# Patient Record
Sex: Male | Born: 1935
Health system: Southern US, Community
[De-identification: ages and names within clinical notes are randomized; demographics above are authoritative.]

## PROBLEM LIST (undated history)

## (undated) DIAGNOSIS — E785 Hyperlipidemia, unspecified: Secondary | ICD-10-CM

## (undated) DIAGNOSIS — Z7902 Long term (current) use of antithrombotics/antiplatelets: Secondary | ICD-10-CM

## (undated) DIAGNOSIS — E669 Obesity, unspecified: Secondary | ICD-10-CM

## (undated) DIAGNOSIS — M7989 Other specified soft tissue disorders: Secondary | ICD-10-CM

## (undated) DIAGNOSIS — I2510 Atherosclerotic heart disease of native coronary artery without angina pectoris: Secondary | ICD-10-CM

## (undated) DIAGNOSIS — C609 Malignant neoplasm of penis, unspecified: Secondary | ICD-10-CM

## (undated) HISTORY — DX: Obesity, unspecified: E66.9

## (undated) HISTORY — PX: CORONARY ARTERY BYPASS GRAFT: SHX141

## (undated) HISTORY — PX: BACK SURGERY: SHX140

## (undated) HISTORY — DX: Hyperlipidemia, unspecified: E78.5

## (undated) HISTORY — PX: CHOLECYSTECTOMY: SHX55

## (undated) HISTORY — DX: Atherosclerotic heart disease of native coronary artery without angina pectoris: I25.10

## (undated) HISTORY — DX: Long term (current) use of antithrombotics/antiplatelets: Z79.02

## (undated) HISTORY — PX: HIATAL HERNIA REPAIR: SHX195

## (undated) HISTORY — PX: OTHER SURGICAL HISTORY: SHX169

## (undated) HISTORY — PX: APPENDECTOMY: SHX54

## (undated) HISTORY — DX: Other specified soft tissue disorders: M79.89

---

## 2002-05-05 ENCOUNTER — Encounter: Payer: Self-pay | Admitting: Neurological Surgery

## 2002-05-09 ENCOUNTER — Inpatient Hospital Stay (HOSPITAL_COMMUNITY): Admission: RE | Admit: 2002-05-09 | Discharge: 2002-05-10 | Payer: Self-pay | Admitting: Neurological Surgery

## 2002-05-09 ENCOUNTER — Encounter: Payer: Self-pay | Admitting: Neurological Surgery

## 2005-05-05 ENCOUNTER — Emergency Department (HOSPITAL_COMMUNITY): Admission: EM | Admit: 2005-05-05 | Discharge: 2005-05-05 | Payer: Self-pay | Admitting: Emergency Medicine

## 2005-05-13 ENCOUNTER — Ambulatory Visit (HOSPITAL_COMMUNITY): Admission: RE | Admit: 2005-05-13 | Discharge: 2005-05-13 | Payer: Self-pay | Admitting: Internal Medicine

## 2005-11-27 HISTORY — PX: CORONARY ANGIOPLASTY: SHX604

## 2005-12-19 ENCOUNTER — Inpatient Hospital Stay (HOSPITAL_COMMUNITY): Admission: AD | Admit: 2005-12-19 | Discharge: 2005-12-23 | Payer: Self-pay | Admitting: Cardiovascular Disease

## 2005-12-22 HISTORY — PX: TRANSTHORACIC ECHOCARDIOGRAM: SHX275

## 2008-11-05 ENCOUNTER — Encounter (INDEPENDENT_AMBULATORY_CARE_PROVIDER_SITE_OTHER): Payer: Self-pay | Admitting: Urology

## 2008-11-05 ENCOUNTER — Inpatient Hospital Stay (HOSPITAL_COMMUNITY): Admission: RE | Admit: 2008-11-05 | Discharge: 2008-11-06 | Payer: Self-pay | Admitting: Urology

## 2009-04-06 ENCOUNTER — Emergency Department (HOSPITAL_COMMUNITY): Admission: EM | Admit: 2009-04-06 | Discharge: 2009-04-06 | Payer: Self-pay | Admitting: Emergency Medicine

## 2009-05-09 ENCOUNTER — Ambulatory Visit (HOSPITAL_COMMUNITY): Payer: Self-pay | Admitting: Urology

## 2009-05-09 ENCOUNTER — Encounter (HOSPITAL_COMMUNITY): Admission: RE | Admit: 2009-05-09 | Discharge: 2009-06-08 | Payer: Self-pay | Admitting: Urology

## 2009-11-26 HISTORY — PX: NM MYOCAR MULTIPLE W/SPECT: HXRAD626

## 2010-08-14 ENCOUNTER — Ambulatory Visit (HOSPITAL_COMMUNITY): Admission: RE | Admit: 2010-08-14 | Discharge: 2010-08-14 | Payer: Self-pay | Admitting: Family Medicine

## 2011-04-07 LAB — BASIC METABOLIC PANEL
BUN: 17 mg/dL (ref 6–23)
Calcium: 9 mg/dL (ref 8.4–10.5)
Calcium: 9.1 mg/dL (ref 8.4–10.5)
GFR calc Af Amer: >60 mL/min (ref >60)
GFR calc Af Amer: >60 mL/min (ref >60)
GFR calc Af Amer: >60 mL/min (ref >60)
GFR calc non Af Amer: 56 mL/min — ABNORMAL LOW (ref >60)
GFR calc non Af Amer: 54 mL/min — ABNORMAL LOW (ref >60)
GFR calc non Af Amer: >60 mL/min (ref >60)
Glucose, Bld: 158 mg/dL — ABNORMAL HIGH (ref 70–99)
Glucose, Bld: 97 mg/dL (ref 70–99)
Potassium: 4.1 mEq/L (ref 3.5–5.1)
Potassium: 4 mEq/L (ref 3.5–5.1)
Sodium: 138 mEq/L (ref 135–145)
Sodium: 140 mEq/L (ref 135–145)

## 2011-04-07 LAB — GENTAMICIN LEVEL, TROUGH
Gentamicin Trough: 0.9 ug/mL (ref 0.5–2.0)
Gentamicin Trough: 0.5 ug/mL (ref 0.5–2.0)

## 2011-05-12 NOTE — Op Note (Signed)
NAME:  Caleb [name], Caleb [name]               ACCOUNT NO.:  [id number]   MEDICAL RECORD NO.:  [id number]          PATIENT TYPE:  AMB   LOCATION:  DAY                           FACILITY:  [facility]   PHYSICIAN:  Ky Barban, M.D.DATE OF BIRTH:  October 19, 1925   DATE OF PROCEDURE:  DATE OF DISCHARGE:                               OPERATIVE REPORT   PREOPERATIVE DIAGNOSIS:  Benign prostatic hypertrophy.   POSTOPERATIVE DIAGNOSIS:  Benign prostatic hypertrophy.   PROCEDURE:  TUR prostate and perineal urethrostomy.   ANESTHESIA:  Spinal.   PROCEDURE:  The patient was given spinal anesthesia in lithotomy  position, usual prep and drape.  A #28 Iglesias resectoscope was  introduced into the bladder, but I could not get the resectoscope in the  bladder.  I was just near the verumontanum.  I just cannot advance it  further beyond that because the urethra was too long, so I decided to do  a perineal urethrostomy.  A #28 Sissy Hoff sound was introduced into the  bladder and over the sounds feeling it in the perineum.  A 1-inch long  incision was made all the way down to the urethra.  Urethra was open.  The margins of the urethral wall on each side were held with the help of  3-0 Vicryl stitch.  Sound was removed, and through the perineum  following the tip of the sound the 28 Iglesias resectoscope was  introduced into the bladder without any problem.  Bladder was drained.  There was about 1000 mL of urine in the bladder.  Once it was drained,  then it was easy to get into the bladder in and out.  Resectoscope was,  as I said, introduced into the bladder and pulled back in the mid  prostatic urethra.  Bladder neck was inspected, and bladder neck was  circumferentially dissected down to the circular fibers.  Resectoscope  was pulled back up to the level of the verumontanum, first the right and  then the left lobe of the prostate which was obstructing the urethra was  completely dissected.  The apical  tissue was removed at the end.  There  was tissue in the anterior midline which was resected.  It was done very  carefully not to injure the sphincter of the verumontanum.  Bleeders  were coagulated, and he had minimal blood loss.  Chips were evacuated.  The prostatic urethra looks open at this point, so I decided to put the  catheter.  A #22 three-way Foley catheter was introduced into the  bladder after removing the resectoscope and the perineum.  First the  perineal urethrostomy was closed with interrupted sutures of 3-0 Vicryl,  and then the periurethral tissue was approximated with 3-0 chromic  interrupted sutures.  Skin was closed with horizontal mattress stitches  using 3-0 chromic at the end.  Betadine ointment applied to the incision  and also to the meatus through and through was started which is fine,  clear and bladder irrigates fine.  At this point, the patient left the  operating room in satisfactory condition after putting sterile dressing  to the perineum.     Ky Barban, M.D.  Electronically Signed    MIJ/MEDQ  D:  11/05/2008  T:  11/06/2008  Job:  [id number]

## 2011-05-12 NOTE — H&P (Signed)
NAME:  GRADEN, HOSHINO               ACCOUNT NO.:  [id number]   MEDICAL RECORD NO.:  [id number]          PATIENT TYPE:  AMB   LOCATION:  DAY                           FACILITY:  [facility]   PHYSICIAN:  Ky Barban, M.D.DATE OF BIRTH:  September 06, 1925   DATE OF ADMISSION:  DATE OF DISCHARGE:  LH                              HISTORY & PHYSICAL   ADDENDUM   A 77 year old gentleman who is coming on Monday to have TUR prostate  done.  I have already dictated the history and physical last week which  was there  for this surgery.  The reason his surgery was postponed from  one Monday to the next because he was taking Plavix which has been  discontinued since last Thursday.  I have gone over the procedure again  when he was here Monday again to see me in the office with the procedure  including risks and complications.  He complains of having a  occasionally urgency and urge incontinence.  I told him that I do not  think it will go away but it is possible it might get better but there  is no guarantee.  The surgery is mainly to help him to urinate more  freely.  His urine culture was done which subsequently came back  negative.  He understands.  He is coming as outpatient, will undergo TUR  prostate on coming Monday, then he will be admitted in the hospital.  Dr. Sherwood Gambler does not come to the hospital.      Ky Barban, M.D.  Electronically Signed     MIJ/MEDQ  D:  11/01/2008  T:  11/01/2008  Job:  [id number]   cc:   Madelin Rear. Sherwood Gambler, MD  Fax: [phone]

## 2011-05-12 NOTE — H&P (Signed)
NAME:  Caleb [name], Caleb [name]               ACCOUNT NO.:  [id number]   MEDICAL RECORD NO.:  [id number]          PATIENT TYPE:  AMB   LOCATION:  DAY                           FACILITY:  [facility]   PHYSICIAN:  Ky Barban, M.D.DATE OF BIRTH:  Feb 08, 1925   DATE OF ADMISSION:  11/05/2008  DATE OF DISCHARGE:  LH                              HISTORY & PHYSICAL   This patient is coming in today to have TUR of the prostate done as  outpatient and then he will be admitted in the hospital.   CHIEF COMPLAINT:  Symptoms of prostatism.   A 77 year old gentleman is being followed by me for last 1 year with  symptoms of prostatism.  Last year his AUA score was 13.  Now it has  gone up to 26 and he is very symptomatic.  He is taking both Flomax and  Avodart, continued to have symptoms.  His PSA was 0.74.  His peak flow  rate is 5 mL.  Average flow rate is 3 mL.  Residual urine 150 mL.  Several urine cultures were done.  So far he has not developed any  urinary tract infection.  He is very symptomatic and has requested  something done about it and he is being brought as outpatient to undergo  TUR of prostate.  I have told him what the procedure, limitations,  complications.  He understands and he was taking Plavix which has been  discontinued 10 days ago.   PAST MEDICAL HISTORY:  1. He has no diabetes or hypertension.  2. He had a cardiac stent done in 2006.  No history of chest pain any      more.  3. He had a cholecystectomy, appendectomy and a hiatal hernia repair      done.  4. He had ruptured disk surgery done 3-4 years ago.   FAMILY HISTORY:  No history of prostate cancer.   PERSONAL HISTORY:  He does not smoke or drink.   REVIEW OF SYSTEMS:  Unremarkable.   PHYSICAL EXAMINATION:  Blood pressure 133/62, temperature is 98.  Well-nourished, well-developed male not in acute distress.  CENTRAL NERVOUS SYSTEM:  Negative.  HEAD/NECK/ENT:  Negative.  CHEST:  Symmetrical.  HEART:  Regular sinus  rhythm, no murmur.  ABDOMINAL:  Soft, flat.  Liver, spleen, kidneys are not palpable.  No  CVA tenderness.  EXTERNAL GENITALIA:  Circumcised, meatus adequate.  Testicles are  normal.  RECTAL EXAM:  Normal sphincter tone.  No rectal mass.  Prostate 1 and  1/2+, smooth and firm.   IMPRESSION:  1. Benign prostate hypertrophy.  2. Coronary artery disease.   PLAN:  Transurethral resection of prostate under anesthesia as  outpatient.      Ky Barban, M.D.  Electronically Signed     MIJ/MEDQ  D:  11/05/2008  T:  11/05/2008  Job:  [id number]   cc:   Madelin Rear. Sherwood Gambler, MD  Fax: [phone]

## 2011-05-15 NOTE — Discharge Summary (Signed)
Caleb [name], Caleb [name]               ACCOUNT NO.:  [id number]   MEDICAL RECORD NO.:  [id number]          PATIENT TYPE:  INP   LOCATION:  A312                          FACILITY:  [facility]   PHYSICIAN:  Ky Barban, M.D.DATE OF BIRTH:  [birth year]   DATE OF ADMISSION:  11/05/2008  DATE OF DISCHARGE:  11/10/2009LH                               DISCHARGE SUMMARY   HISTORY:  An 77 years old gentleman with longstanding history of  recurrent urinary tract infections and symptoms of prostatism.  Cystoscopy shows he has enlarged prostate with a bladder neck  obstruction.  I advised him to undergo TUR of prostate.  He has a  routine preadmission workup done.  He has coronary stent placed in 2006  and he was on Plavix, so I have to discontinue that for few days before  his surgery.  His past history includes having cholecystectomy,  appendectomy, hiatal hernia repair, history of coronary stent in 2006,  and history of ruptured disk 3-4 years ago.  No history of prostate  cancer.  On examination moderately obese, not in acute distress, fully  conscious, alert, and oriented.  He was brought as outpatient to undergo  TUR of prostate.  It was done.  During the surgery, I could not get the  resectoscope into his bladder.  It was not long enough, so I had to do a  perineal urethrostomy, so I did his TUR of prostate through the  perineum.  Postoperatively, he is doing fine.  His first postop day,  urine is clear and CBI is discontinued.  His second postop day, perineal  wound is healing up nicely.  I decided to send him home with a catheter,  which I will take it out in the office.   FINAL DISCHARGE DIAGNOSES:  1. Benign prostatic hypertrophy.  2. Coronary artery disease.   DISCHARGE CONDITION:  Improved.   DISCHARGE MEDICATIONS:  He is advised to continue his usual medicines  and I will see him back in the office in 1 week.      Ky Barban, M.D.  Electronically Signed     MIJ/MEDQ  D:  11/21/2008  T:  11/22/2008  Job:  [id number]

## 2011-05-15 NOTE — Op Note (Signed)
[place]  Patient:    Caleb [name], Caleb [name] Visit Number: [id number] MRN: [id number]          Service Type: SUR Location: 3000 [id number] Attending Physician:  Jonne Ply Dictated by:   Stefani Dama, M.D. Proc. Date: 05/09/02 Admit Date:  05/09/2002 Discharge Date: 05/10/2002                             Operative Report  PREOPERATIVE DIAGNOSIS:  L4-5 herniated nucleus pulposus on the left with left lumbar radiculopathy.  POSTOPERATIVE DIAGNOSIS:  L4-5 herniated nucleus pulposus on the left with left lumbar radiculopathy.  PROCEDURE:  Left lumbar microendoscopic diskectomy with Met-RX and operating microscope, L4-5 left.  SURGEON:  Stefani Dama, M.D.  FIRST ASSISTANT:  Clydene Fake, M.D.  ANESTHESIA:  General endotracheal.  INDICATION:  The patient is a 77 year old individual who developed a problem about a week after Christmas 2002.  He was cleaning house and noted that when he had gotten up from a bent-over position he felt a sudden pull in his back. He developed leg pain and notably pain in his joints about the hip.  He was seen by Dr. Farris Has and on January 29 underwent an MRI of the lumbar spine, which demonstrated a large extruded fragment of disk at the L4-5 segment on the left side.  He notes that pain has largely relinquished itself.  He has, however, been limiting his activity and he notes that he walks with a severe limp and he had an obvious total foot drop in the office when seen on April 04, 2002.  He is taken to the operating room to undergo an excision of a large herniated disk fragment at L4-5.  DESCRIPTION OF PROCEDURE:  The patient was brought to the operating room supine on the stretcher.  After the smooth induction of general endotracheal anesthesia, he was turned prone.  The back was shaved, prepped with Duraprep, and draped in a sterile fashion.  Bolsters were used to pad the sides and back as best  possible to alleviate pressure on the abdomen; however, the patient had a rather prominent abdomen, making this marginal at best.  The fluoroscope was draped and brought into the field and with PA fluoroscopy, the L4-5 space was identified.  Lateral fluoroscopy confirmed the L4-5 space on the left side, and the area over the L4-5 space was then infiltrated with lidocaine 1% with epinephrine.  A vertical incision was made in the paramedian region on that left side, and then a K-wire was passed to the laminar arch of L4.  Then using a wanding technique, a series of dilators was placed out to the 18 mm diameter.  An 18 mm diameter x 6 cm deep endoscopic cannula was placed down to the interspace at L4-5 and affixed to the operating table with a clamp.  Then using a monopolar cautery, the inferior margin of the lamina of L4 was cleared up the mesial wall of the facet.  The laminar arch was then cleared using a high-speed air drill and a 2.8 mm dissecting tool using the microscope for guidance.  Then the yellow ligament was taken up in this region.  The area was cleared out laterally to the area just above the disk space.  Then using a suction cannula, the common dural tube was gently retracted.  Epidural veins in this area were noted to be quite prolific.  Attempts  to cauterize them led to some bleeding.  This required tamponading with Gelfoam and then slowly proceeding, mobilizing the tamponade cephalad to expose an underlying mass. The mass was noted to be an entanglement of disk within the area of the epidural veins.  The veins were cauterized; however, they seemed to be fairly refractory to cauterization for hemostasis.  Fragments of the disk started to be removed in this fashion with bleeding ensuing.  The bleeding was controllable with the suction; however, it was noted to be quite prolific and this hampered the progress of the procedure.  Fragments of disk presented themselves and were  easily removed in a piecemeal fashion; however, hemostasis needed to be obtained intermittently to maintain the progress with trying to minimize the blood loss.  Nonetheless, blood loss for the entire procedure was estimated at 1900 cc.  In the end, however, the common dural tube and the takeoff of the L5 nerve root were felt to be well-decompressed.  The disk space itself was not entered, as the ligament over this area had healed itself down fairly well.  Hemostasis was ultimately obtained with some small pledgets of Gelfoam left in the epidural space with cottonoid padding, which was later removed.  The area was irrigated copiously with antibiotic irrigating solution.  No openings were created into the dura itself.  No spinal fluid leaks were encountered.  At this point the procedure was completed with the subcutaneous fascia being closed with 3-0 Vicryl in interrupted fashion and 3-0 Vicryl being used to close the skin.  The patient tolerated the procedure well.  He did have some brief periods of relative hypotension, which easily responded to fluid administration.  He was returned to the recovery room in stable condition.  His hematocrit will be watched during the postoperative period. Dictated by:   Stefani Dama, M.D. Attending Physician:  Jonne Ply DD:  05/09/02 TD:  05/10/02 Job: [id number]

## 2011-05-15 NOTE — H&P (Signed)
[place]. [facility]  Patient:    IRISH, PIECH Visit Number: [id number] MRN: [id number]          Service Type: SUR Location: 3000 [id number] Attending Physician:  Jonne Ply Dictated by:   Stefani Dama, M.D. Admit Date:  05/09/2002 Discharge Date: 05/10/2002                           History and Physical  There was no dictation for this report. Dictated by:   Stefani Dama, M.D. Attending Physician:  Jonne Ply DD:  05/09/02 TD:  05/10/02 Job: [id number]

## 2011-05-15 NOTE — Discharge Summary (Signed)
NAMESTEPHANIE, [name] NO.:  [id number]   MEDICAL RECORD NO.:  [id number]          PATIENT TYPE:  INP   LOCATION:  6533                         FACILITY:  [facility]   PHYSICIAN:  Nanetta Batty, M.D.   DATE OF BIRTH:  [birth year]   DATE OF ADMISSION:  12/19/2005  DATE OF DISCHARGE:  12/23/2005                                 DISCHARGE SUMMARY   HISTORY OF PRESENT ILLNESS AND HOSPITAL COURSE:  Caleb [name] is a 77 year old  male patient with a prior history of CAD who presented to the [facility] emergency room with chest pain.  Thus, he was admitted to the  hospital to rule out for a MI.  He continued to have pain despite apparently  IV nitroglycerin.  His first troponin was positive; however, the next two  were negative.  His MB was slightly elevated and of questionable  significance.  Because of symptoms, it was decided that he should undergo a  2-D echocardiogram, bilateral lower extremity Dopplers, and CT scan.  CT  scan was negative.  His Dopplers showed a peroneal DVT.  This was thought by  Dr. Allyson Sabal to just be subclinical.  He thought maybe it was chronic.  He  underwent cardiac catheterization on December 22, 2005 by Dr. Allyson Sabal.  He was  found to have 75% proximal RCA disease.  He underwent stenting with a Cypher  stent, 3.0 x 13.  The following day, December 23, 2005, he was considered  stable for discharge home.  His blood pressure was 116/60, heart rate 74,  respirations 16, temperature 98.1.   LABORATORY DATA:  Hemoglobin 12.0, hematocrit 34.0, platelets 147, WBC 6.3.  Sodium 139, potassium 3.7, BUN 11, creatinine 1.3, glucose 155.  CK-MB post-  procedure was 58/1.0.   CT scan showed no definite pulmonary emboli, no dissection or aortic  aneurysm.  His Dopplers showed a DVT in the peroneal vein.  The remaining  vessels were patent.  He had varicose veins.  This was on the right.  He had  evidence of sclerotherapy and laser surgery for his varicose  veins.   DISCHARGE MEDICATIONS:  1.  Plavix 75 mg one time per day.  2.  Aspirin 81 mg one time per day.  3.  Metoprolol 25 mg two times per day.  4.  Nitroglycerin one under the tongue when needed for chest pain.  5.  He is to hold his Cardura for now.  6.  He can take over-the-counter Prilosec or Zantac for stomach acid relief.  7.  Lipitor 10 mg once per day.   DISCHARGE DIAGNOSES:  1.  Coronary artery disease status post catheterization on December 02, 2005      by Dr. Allyson Sabal showing a 75% stenosis in his proximal right coronary      artery.  He had a 50% left anterior descending.  He underwent Cypher      stenting of his proximal right coronary artery.  2.  Hyperlipidemia.  3.  History of varicosities with previous laser procedures.  4.  Normal ejection fraction.  Caleb [name], N.P.      Nanetta Batty, M.D.  Electronically Signed    BB/MEDQ  D:  02/21/2006  T:  02/22/2006  Job:  [id number]   cc:   Sharyl Nimrod, M.D.  [employer]

## 2011-05-15 NOTE — Cardiovascular Report (Signed)
NAME:  Caleb [name], Caleb [name] NO.:  [id number]   MEDICAL RECORD NO.:  [id number]          PATIENT TYPE:  EMS   LOCATION:  ED                            FACILITY:  [facility]   PHYSICIAN:  Nanetta Batty, M.D.   DATE OF BIRTH:  1925/01/16   DATE OF PROCEDURE:  DATE OF DISCHARGE:  12/19/2005                              CARDIAC CATHETERIZATION   HISTORY OF PRESENT ILLNESS:  Mr. Shell is a 77 year old single, white male  without prior cardiac history.  He is admitted on 12/19/05 with atypical  chest pain, rule out for myocardial infarction.  Spiral CT of his chest was  negative.  He had no repeat EKG changes.  He did have perineal venous  Doppler studies.  He presents now for angiography and potential  intervention.   PROCEDURE DESCRIPTION:  The patient was brought to the second floor [facility] cardiac catheterization lab in the postabsorptive state.  He was  premedicated with p.o. Valium and intravenous Versed.  His right groin was  prepped and shaved in the usual sterile fashion.  Xylocaine 1% was used for  local anesthesia.  A 6-French sheath was inserted into the right femoral  artery using sterile Seldinger technique.  A 6-French right and left Judkins  diagnostic catheter as well as a 6-French pigtail catheter were used for  selective coronary angiography, left ventriculography, subselective left  internal mammary artery RAO angiography, and distal abdominal aortography.  Visipaque dye was used for the entirety of the case.  Per respiratory order,  left ventricular and pulmonic pressures were recorded.   HEMODYNAMICS:  Aortic systolic pressure 157, left ventricular systolic  pressure 139, and diastolic pressure 19.  There did not appear to be a pull  back gradient, however.   SELECTIVE CORONARY ANGIOGRAPHY:  1.  Left main: Normal.  2.  LAD: The LAD was heavily calcified in its proximal mid third.  There was      a 50% segmental stenosis in the mid portion.  3.  Left  circumflex: Non-dominant and free of systemic disease.  4.  Right coronary artery: Dominant with a 75% segmental stenosis on the      first bend.  5.  Left ventriculography: RAO left ventriculogram was performed using 25 mL      of Visipaque dye at 12 cc/sec.  The overall LVEF was estimated greater      than 60% without septal wall motion abnormalities.  6.  Left internal mammary artery: The test was subselectively visualized and      was mildly patent.  It was suitable for use during coronary artery      bypass grafting.  7.  Distal abdominal aortography: Distal abdominal aortogram was performed      using 20 mL of Visipaque dye at 20 mL/sec.  The renal artery was patent.      Infrarenal abdominal aorta was free of systemic disease as was the iliac      bifurcation.  The right iliac artery was mildly tortuous.   IMPRESSION:  Mr. Matlack has a significant lesion in the proximal bend of  his  dominant RCA.  We will proceed with PCI and stenting using IIB-3 inhibitor  and drug-eluting stent.   Because of tortuosity in the right iliac, a 7-French 40 cm long sheath was  inserted with some difficulty using extra stiff 0.38 Amplatz wire.  The  patient was on aspirin, received 600 mg of p.o. Plavix, and 2000 units of  heparin with an ACT of 243.  He received Integrilin double bolus infusion.  Visipaque dye was used for the entirety of the intervention.  Retrograde  aortic pressure was monitored throughout the case.   Using a 7-French hockeystick Guidant catheter and a __________ sliding soft  wire, over a 3013 Cypher drug-eluting stent, direct stenting was performed.  Guidant catheter was carefully and slowly torqued into position with  excellent axial position with good backup.  The wire traversed the tortuous  right coronary artery with some difficulty and was placed in the distal  vessel. A stent went easily across the lesion and was deployed to 15  atmospheres (3.23 mm).  Intracoronary  nitroglycerin 200 mcg was then  administered.  Final angiographic result was the reduction of a 75%  segmental proximal RCA stenosis to 0% residual with TIMI-III flow.  The  patient tolerated the procedure well.  Thigh wire and catheter were removed.  The existing 7-French 40 cm long Cordis sheath was exchanged for a short 8-  [place] sheath because of oozing around the sheath at the entry site.  The  sheath was securely in place.  The patient left the lab in stable condition.  The plan will be to remove the sheaths once the ACT falls below 150.  Integrilin will be continued for 18 hours.  The patient will be hydrated  overnight.  Discussion will be had in the morning with regard to aspirin and  Plavix indefinitely versus addition of Coumadin for his perineal vein DVT.  The patient left the lab in stable condition.      Nanetta Batty, M.D.  Electronically Signed     JB/MEDQ  D:  12/22/2005  T:  12/23/2005  Job:  [id number]   cc:   [facility]  [address], [place] [address]   Dr. Sharyl Nimrod at [facility]

## 2011-08-11 ENCOUNTER — Encounter: Payer: Self-pay | Admitting: Orthopedic Surgery

## 2011-08-11 ENCOUNTER — Other Ambulatory Visit: Payer: Self-pay

## 2011-08-11 ENCOUNTER — Ambulatory Visit (INDEPENDENT_AMBULATORY_CARE_PROVIDER_SITE_OTHER): Payer: Medicare Other | Admitting: Orthopedic Surgery

## 2011-08-11 VITALS — HR 78 | Ht 73.0 in | Wt 255.0 lb

## 2011-08-11 DIAGNOSIS — M25569 Pain in unspecified knee: Secondary | ICD-10-CM

## 2011-08-11 NOTE — Progress Notes (Signed)
Chief complaint: right knee pain  HPI:(4) 59+ yo male with Atraumatic onset of RIGHT knee pain in the middle of July with no history of trauma.  The patient elevated his leg for 2 days and the pain went away.  He is currently not having any pain.  No swelling no catching no locking no giving way.  ROS:(2) Positive findings under the head eyes ear nose and throat with occasional watering of the eyes in the morning.  He has difficulty urinating secondary prostate disease.  He has easy bleeding secondary to Plavix.  PFSH: (1) Coronary artery disease and hyperlipidemia  Physical Exam(12) GENERAL: normal development   CDV: pulses are normal   Skin: normal  Lymph: nodes were not palpable/normal  Psychiatric: awake, alert and oriented  Neuro: normal sensation  MSK He ambulates with decreased stride length and he has mild varus alignment to his knees but he does not limp and he does not use a cane 1There is no tenderness to the medial or lateral joint line, no tenderness to the patellar tendon or quadriceps tendon.  He does have some tenderness along the medial pes anserine bursa. 2 Range of motion of the knee mild flexion contracture, 120 of flexion 3 There is no atrophy and there is normal muscle tone to the quadriceps 4 All ligaments are stable 5 McMurray sign is negative 6 He has mild peripheral edema and significant varicose veins in his lower legs  Assessment: Knee pain unexplained perhaps a mild synovitis of the joint.    Plan: Tylenol arthritis one tablet 3 times a day as needed for knee pain  X-ray taken today in the office 3 views RIGHT knee  There is mild medial joint space narrowing but I am very surprised at the excellent appearance of his knee at 51+ years old.  There is minimal varus alignment.  Impression mild joint space narrowing medially essentially normal appearing x-ray

## 2011-08-11 NOTE — Patient Instructions (Addendum)
TYLENOL ARTHRITIS 1 TABLET PO 3 TIMES A DAY WHEN KNEE IS BOTHERING YOU

## 2011-09-28 LAB — CBC
HCT: 37.9 — ABNORMAL LOW
Platelets: 161
WBC: 5.8

## 2011-09-28 LAB — BASIC METABOLIC PANEL
BUN: 19
Creatinine, Ser: 1.44
GFR calc Af Amer: 57 — ABNORMAL LOW
GFR calc non Af Amer: 47 — ABNORMAL LOW
Potassium: 4.2

## 2011-09-29 LAB — CBC
Platelets: 118 — ABNORMAL LOW
WBC: 8.3

## 2011-09-29 LAB — BASIC METABOLIC PANEL
BUN: 13
Creatinine, Ser: 1.2
GFR calc non Af Amer: 58 — ABNORMAL LOW

## 2011-09-29 LAB — DIFFERENTIAL
Lymphocytes Relative: 12
Lymphs Abs: 1
Neutrophils Relative %: 79 — ABNORMAL HIGH

## 2011-09-29 LAB — GLUCOSE, CAPILLARY: Glucose-Capillary: 83

## 2011-09-29 LAB — PROTIME-INR
INR: 1.1
Prothrombin Time: 14.5

## 2011-11-04 ENCOUNTER — Other Ambulatory Visit (HOSPITAL_COMMUNITY): Payer: Self-pay | Admitting: Family Medicine

## 2011-11-04 DIAGNOSIS — M1929 Secondary osteoarthritis, other specified site: Secondary | ICD-10-CM

## 2011-11-04 DIAGNOSIS — M7989 Other specified soft tissue disorders: Secondary | ICD-10-CM

## 2011-11-05 ENCOUNTER — Ambulatory Visit (HOSPITAL_COMMUNITY)
Admission: RE | Admit: 2011-11-05 | Discharge: 2011-11-05 | Disposition: A | Discharge status: Home / Self Care | Payer: Medicare Other | Source: Ambulatory Visit | Attending: Family Medicine | Admitting: Family Medicine

## 2011-11-05 DIAGNOSIS — M79609 Pain in unspecified limb: Secondary | ICD-10-CM | POA: Insufficient documentation

## 2011-11-05 DIAGNOSIS — M7989 Other specified soft tissue disorders: Secondary | ICD-10-CM | POA: Insufficient documentation

## 2011-11-05 DIAGNOSIS — M1929 Secondary osteoarthritis, other specified site: Secondary | ICD-10-CM

## 2011-11-17 ENCOUNTER — Ambulatory Visit: Payer: Medicare Other | Admitting: Orthopedic Surgery

## 2012-08-03 DIAGNOSIS — H35369 Drusen (degenerative) of macula, unspecified eye: Secondary | ICD-10-CM | POA: Diagnosis not present

## 2012-08-03 DIAGNOSIS — H2589 Other age-related cataract: Secondary | ICD-10-CM | POA: Diagnosis not present

## 2012-09-29 DIAGNOSIS — Z23 Encounter for immunization: Secondary | ICD-10-CM | POA: Diagnosis not present

## 2012-12-13 DIAGNOSIS — E785 Hyperlipidemia, unspecified: Secondary | ICD-10-CM | POA: Diagnosis not present

## 2012-12-13 DIAGNOSIS — Z6835 Body mass index (BMI) 35.0-35.9, adult: Secondary | ICD-10-CM | POA: Diagnosis not present

## 2012-12-13 DIAGNOSIS — I2510 Atherosclerotic heart disease of native coronary artery without angina pectoris: Secondary | ICD-10-CM | POA: Diagnosis not present

## 2012-12-13 DIAGNOSIS — E669 Obesity, unspecified: Secondary | ICD-10-CM | POA: Diagnosis not present

## 2013-02-07 DIAGNOSIS — N390 Urinary tract infection, site not specified: Secondary | ICD-10-CM | POA: Diagnosis not present

## 2013-02-07 DIAGNOSIS — N433 Hydrocele, unspecified: Secondary | ICD-10-CM | POA: Diagnosis not present

## 2013-02-08 ENCOUNTER — Other Ambulatory Visit (HOSPITAL_COMMUNITY): Payer: Self-pay | Admitting: Urology

## 2013-02-08 DIAGNOSIS — N433 Hydrocele, unspecified: Secondary | ICD-10-CM

## 2013-02-08 DIAGNOSIS — N390 Urinary tract infection, site not specified: Secondary | ICD-10-CM | POA: Diagnosis not present

## 2013-02-13 ENCOUNTER — Ambulatory Visit (HOSPITAL_COMMUNITY): Payer: Medicare Other

## 2013-02-15 ENCOUNTER — Other Ambulatory Visit (HOSPITAL_COMMUNITY): Payer: Self-pay | Admitting: Urology

## 2013-02-15 ENCOUNTER — Ambulatory Visit (HOSPITAL_COMMUNITY)
Admission: RE | Admit: 2013-02-15 | Discharge: 2013-02-15 | Disposition: A | Discharge status: Home / Self Care | Payer: Medicare Other | Source: Ambulatory Visit | Attending: Urology | Admitting: Urology

## 2013-02-15 DIAGNOSIS — R9389 Abnormal findings on diagnostic imaging of other specified body structures: Secondary | ICD-10-CM | POA: Insufficient documentation

## 2013-02-15 DIAGNOSIS — N433 Hydrocele, unspecified: Secondary | ICD-10-CM | POA: Diagnosis not present

## 2013-02-15 DIAGNOSIS — N5089 Other specified disorders of the male genital organs: Secondary | ICD-10-CM | POA: Diagnosis not present

## 2013-02-16 DIAGNOSIS — N390 Urinary tract infection, site not specified: Secondary | ICD-10-CM | POA: Diagnosis not present

## 2013-02-16 DIAGNOSIS — N319 Neuromuscular dysfunction of bladder, unspecified: Secondary | ICD-10-CM | POA: Diagnosis not present

## 2013-04-05 DIAGNOSIS — N390 Urinary tract infection, site not specified: Secondary | ICD-10-CM | POA: Diagnosis not present

## 2013-04-10 DIAGNOSIS — N342 Other urethritis: Secondary | ICD-10-CM | POA: Diagnosis not present

## 2013-04-10 DIAGNOSIS — R369 Urethral discharge, unspecified: Secondary | ICD-10-CM | POA: Diagnosis not present

## 2013-04-17 DIAGNOSIS — N480 Leukoplakia of penis: Secondary | ICD-10-CM | POA: Diagnosis not present

## 2013-04-20 ENCOUNTER — Ambulatory Visit (HOSPITAL_COMMUNITY)
Admission: RE | Admit: 2013-04-20 | Discharge: 2013-04-20 | Disposition: A | Discharge status: Home / Self Care | Payer: Medicare Other | Source: Ambulatory Visit | Attending: Internal Medicine | Admitting: Internal Medicine

## 2013-04-20 ENCOUNTER — Other Ambulatory Visit (HOSPITAL_COMMUNITY): Payer: Self-pay | Admitting: Internal Medicine

## 2013-04-20 DIAGNOSIS — R609 Edema, unspecified: Secondary | ICD-10-CM

## 2013-04-20 DIAGNOSIS — M7989 Other specified soft tissue disorders: Secondary | ICD-10-CM | POA: Diagnosis not present

## 2013-04-20 DIAGNOSIS — Z6834 Body mass index (BMI) 34.0-34.9, adult: Secondary | ICD-10-CM | POA: Diagnosis not present

## 2013-04-20 DIAGNOSIS — I2510 Atherosclerotic heart disease of native coronary artery without angina pectoris: Secondary | ICD-10-CM | POA: Diagnosis not present

## 2013-04-21 DIAGNOSIS — N390 Urinary tract infection, site not specified: Secondary | ICD-10-CM | POA: Diagnosis not present

## 2013-05-03 DIAGNOSIS — R55 Syncope and collapse: Secondary | ICD-10-CM | POA: Diagnosis not present

## 2013-05-03 DIAGNOSIS — I2510 Atherosclerotic heart disease of native coronary artery without angina pectoris: Secondary | ICD-10-CM | POA: Diagnosis not present

## 2013-05-03 DIAGNOSIS — E782 Mixed hyperlipidemia: Secondary | ICD-10-CM | POA: Diagnosis not present

## 2013-05-17 DIAGNOSIS — G5600 Carpal tunnel syndrome, unspecified upper limb: Secondary | ICD-10-CM | POA: Diagnosis not present

## 2013-05-17 DIAGNOSIS — M25469 Effusion, unspecified knee: Secondary | ICD-10-CM | POA: Diagnosis not present

## 2013-05-26 ENCOUNTER — Other Ambulatory Visit: Payer: Self-pay | Admitting: *Deleted

## 2013-05-26 DIAGNOSIS — M7989 Other specified soft tissue disorders: Secondary | ICD-10-CM

## 2013-06-07 ENCOUNTER — Ambulatory Visit (INDEPENDENT_AMBULATORY_CARE_PROVIDER_SITE_OTHER): Payer: Medicare Other | Admitting: Cardiovascular Disease

## 2013-06-07 ENCOUNTER — Encounter: Payer: Self-pay | Admitting: Cardiovascular Disease

## 2013-06-07 VITALS — BP 132/84 | HR 68 | Ht 72.0 in | Wt 267.0 lb

## 2013-06-07 DIAGNOSIS — E785 Hyperlipidemia, unspecified: Secondary | ICD-10-CM | POA: Insufficient documentation

## 2013-06-07 DIAGNOSIS — I2510 Atherosclerotic heart disease of native coronary artery without angina pectoris: Secondary | ICD-10-CM | POA: Diagnosis not present

## 2013-06-07 DIAGNOSIS — R209 Unspecified disturbances of skin sensation: Secondary | ICD-10-CM | POA: Diagnosis not present

## 2013-06-07 DIAGNOSIS — R200 Anesthesia of skin: Secondary | ICD-10-CM | POA: Insufficient documentation

## 2013-06-07 NOTE — Progress Notes (Signed)
06/07/2013 Caleb [name]   04/11/1925  [id number]  Primary Physician Cassell Smiles., MD Primary Cardiologist: Runell Gess MD Caleb [name]   HPI:  The patient is an 79 year old, overweight Caucasian male with a history of coronary artery disease status post RCA PCI and stenting with a Cypher drug-eluting stent December 2006. He also had a 50% mid LAD lesion with normal LV function at that time. His last 2D echocardiogram was in 2006 as well which showed normal LV function possibly at the lower limits of normal. He has mild to moderate aortic sclerosis without stenosis. Mild to moderate mitral annular calcification. Left atrium was mildly dilated. He also has a history of hyperlipidemia.   The patient presents today with complaints of paresthesia or numbness and tingling in his upper extremities typically right before he wakes up. He also has the problem intermittently in his lower extremities. He does not have these issues during the day and it is not until after he has been asleep and then wakes up that he has these problems. Once he gets up from sleeping and he starts moving around the symptoms go away. He also has a history of ruptured disk somewhere in the lower back approximately 5 to 6 years ago. He has had surgery for that. He also reports that he fell, essentially tripped over a slipper and fell to his right knee approximately 2.5 weeks ago. This has caused swelling in his right lower extremity since then. He did have a lower extremity venous Doppler on April 20, 2013, which showed no evidence of lower extremity deep vein thrombosis. He denies nausea, vomiting, fever, lightheadedness, dizziness, shortness of breath, orthopnea, paroxysmal nocturnal dyspnea, chest pain, claudication, abdominal pain.   He did see Dr. Valma Cava, orthopedic surgeon, since he was seen last for evaluation of bilateral upper extremity numbness which apparently was thought to be either carpal  tunnel syndrome or cervical radiculopathy.he has right lower extremity swelling apparently had venous Dopplers but that did not show DVT. He is referred by Dr. Thomasena Edis to [facility] for further evaluation.      Current Outpatient Prescriptions  Medication Sig Dispense Refill  . aspirin 81 MG tablet Take 81 mg by mouth daily.        . clopidogrel (PLAVIX) 75 MG tablet       . metoprolol tartrate (LOPRESSOR) 25 MG tablet       . simvastatin (ZOCOR) 20 MG tablet        No current facility-administered medications for this visit.    No Known Allergies  History   Social History  . Marital Status: Divorced    Spouse Name: N/A    Number of Children: N/A  . Years of Education: N/A   Occupational History  . retired    Social History Main Topics  . Smoking status: Never Smoker   . Smokeless tobacco: Not on file  . Alcohol Use: No  . Drug Use: No  . Sexually Active: Not on file   Other Topics Concern  . Not on file   Social History Narrative  . No narrative on file     Review of Systems: General: negative for chills, fever, night sweats or weight changes.  Cardiovascular: negative for chest pain, dyspnea on exertion, edema, orthopnea, palpitations, paroxysmal nocturnal dyspnea or shortness of breath Dermatological: negative for rash Respiratory: negative for cough or wheezing Urologic: negative for hematuria Abdominal: negative for nausea, vomiting, diarrhea, bright red blood per rectum, melena, or hematemesis Neurologic:  negative for visual changes, syncope, or dizziness All other systems reviewed and are otherwise negative except as noted above.    Blood pressure 132/84, pulse 68, height 6' (1.829 m), weight 267 lb (121.11 kg).  General appearance: alert and no distress Neck: no adenopathy, no carotid bruit, no JVD, supple, symmetrical, trachea midline and thyroid not enlarged, symmetric, no tenderness/mass/nodules Lungs: clear to auscultation bilaterally Heart: normal  apical impulse Extremities: right calf swelling  EKG not performed today  ASSESSMENT AND PLAN:   Hyperlipidemia Followed by his primary care physician. He is on a statin drug. His last lipid profile performed 12/23/12 revealed a cholesterol 144, LDL 91 and HDL of 40      Runell Gess MD [facility], [facility] 06/07/2013 11:57 AM

## 2013-06-07 NOTE — Assessment & Plan Note (Signed)
Followed by his primary care physician. He is on a statin drug. His last lipid profile performed 12/23/12 revealed a cholesterol 144, LDL 91 and HDL of 40

## 2013-06-07 NOTE — Patient Instructions (Addendum)
Your physician wants you to follow-up in: 6 months with Wilburt Finlay PA and 12 months with Dr Allyson Sabal. You will receive a reminder letter in the mail two months in advance. If you don't receive a letter, please call our office to schedule the follow-up appointment.

## 2013-07-12 ENCOUNTER — Encounter: Payer: Self-pay | Admitting: Vascular Surgery

## 2013-07-13 ENCOUNTER — Encounter: Payer: Self-pay | Admitting: Vascular Surgery

## 2013-07-13 ENCOUNTER — Encounter (INDEPENDENT_AMBULATORY_CARE_PROVIDER_SITE_OTHER): Payer: Medicare Other | Admitting: Vascular Surgery

## 2013-07-13 ENCOUNTER — Ambulatory Visit (INDEPENDENT_AMBULATORY_CARE_PROVIDER_SITE_OTHER): Payer: Medicare Other | Admitting: Vascular Surgery

## 2013-07-13 VITALS — BP 139/69 | HR 67 | Temp 97.9°F | Ht 72.0 in | Wt 263.0 lb

## 2013-07-13 DIAGNOSIS — M7989 Other specified soft tissue disorders: Secondary | ICD-10-CM | POA: Diagnosis not present

## 2013-07-13 DIAGNOSIS — I739 Peripheral vascular disease, unspecified: Secondary | ICD-10-CM | POA: Insufficient documentation

## 2013-07-13 HISTORY — DX: Peripheral vascular disease, unspecified: I73.9

## 2013-07-13 NOTE — Progress Notes (Signed)
[facility] [place] HISTORY AND PHYSICAL   History of Present Illness:  Patient is a 79 y.o. year old male who presents for evaluation of swelling in the right lower extremity. He had 2 falls recently and injured his right knee. He was seen by Dr. Thomasena Edis for this recently. His knee checked out okay. He was referred here by Dr. Thomasena Edis for further evaluation of his leg swelling. Patient previously had what sounds like laser ablation of his right and left greater saphenous veins by Dr. Donia Ast approximately 5 years ago. The leg swelling has now been present for 3-4 weeks. He has had chronic staining of the skin in both legs. He does have some itching and weeping in the right leg. He has no prior episodes of similar events. He denies claudication or rest pain. He does have compression stockings but has not been very compliant with these. Other medical problems include hyperlipidemia, coronary disease, obesity, hyperlipidemia all of which are currently stable  Past Medical History  Diagnosis Date  . Hyperlipidemia   . Coronary artery disease   . Obesity   . Hyperlipidemia     Past Surgical History  Procedure Laterality Date  . Appendectomy    . Cholecystectomy    . Coronary artery bypass graft    . Hiatal hernia repair    . Back surgery       Social History History  Substance Use Topics  . Smoking status: Former Smoker    Types: Cigarettes    Quit date: 07/14/1983  . Smokeless tobacco: Not on file  . Alcohol Use: No    Family History Family History  Problem Relation Age of Onset  . Cancer    . Cancer Father   . Cancer Sister   . Cancer Brother     Allergies  No Known Allergies   Current Outpatient Prescriptions  Medication Sig Dispense Refill  . aspirin 81 MG tablet Take 81 mg by mouth daily.        . clopidogrel (PLAVIX) 75 MG tablet       . metoprolol tartrate (LOPRESSOR) 25 MG tablet       . simvastatin (ZOCOR) 20 MG tablet        No current  facility-administered medications for this visit.    ROS:   General:  No weight loss, Fever, chills  HEENT: No recent headaches, no nasal bleeding, no visual changes, no sore throat  Neurologic: No dizziness, blackouts, seizures. No recent symptoms of stroke or mini- stroke. No recent episodes of slurred speech, or temporary blindness.  Cardiac: No recent episodes of chest pain/pressure, no shortness of breath at rest.  No shortness of breath with exertion.  Denies history of atrial fibrillation or irregular heartbeat  Vascular: No history of rest pain in feet.  No history of claudication.  No history of non-healing ulcer, No history of DVT   Pulmonary: No home oxygen, no productive cough, no hemoptysis,  No asthma or wheezing  Musculoskeletal:  [ ]  Arthritis, [ ]  Low back pain,  [ ]  Joint pain  Hematologic:No history of hypercoagulable state.  No history of easy bleeding.  No history of anemia  Gastrointestinal: No hematochezia or melena,  No gastroesophageal reflux, no trouble swallowing  Urinary: [ ]  chronic Kidney disease, [ ]  on HD - [ ]  MWF or [ ]  TTHS, [ ]  Burning with urination, [ ]  Frequent urination, [ ]  Difficulty urinating;   Skin: No rashes  Psychological: No history of  anxiety,  No history of depression   Physical Examination  Filed Vitals:   07/13/13 0959  BP: 139/69  Pulse: 67  Temp: 97.9 F (36.6 C)  TempSrc: Oral  Height: 6' (1.829 m)  Weight: 263 lb (119.296 kg)  SpO2: 96%    Body mass index is 35.66 kg/(m^2).  General:  Alert and oriented, no acute distress HEENT: Normal Neck: No bruit or JVD Pulmonary: Clear to auscultation bilaterally Cardiac: Regular Rate and Rhythm without murmur Abdomen: Soft, non-tender, non-distended, no mass Skin: No rash, stasis dermatitis as well as very small punctate ulcerations scattered right medial gaiter area with brawny staining no obvious palpable varicosities edema right leg approximately 10-15% larger than  left slightly pitting hemosiderin staining left gaiter area Extremity Pulses:  2+ radial, brachial, femoral, 2+ right dorsalis pedis, 2+ posterior tibial pulses bilaterally absent left dorsalis pedis Musculoskeletal: No deformity  Neurologic: Upper and lower extremity motor 5/5 and symmetric  DATA: The patient had a venous reflux exam of his right leg today. This showed reflux in his right lesser saphenous vein with diameter 3-4 mm the greater saphenous vein was absent most likely from previous ablation deep veins were patent without reflux or obstruction. I reviewed and interpreted this study.   ASSESSMENT: Venous stasis ulceration right lower extremity with normal arterial circulation   PLAN:  The patient was placed in an Unna boot today to attempt to heal the ulcerations and improve skin integrity in his right lower extremity. He'll return once weekly for change of this and return for followup with me in one month. The use of compression stockings on his feet was emphasized and that he should be compliant with [place] on a daily basis to prevent recurrent ulceration. He has several pairs of stockings and did not need the prescription renewed at this time.  Fabienne Bruns, MD [facility] [place] Office: [phone] Pager: [phone]

## 2013-07-20 ENCOUNTER — Ambulatory Visit (INDEPENDENT_AMBULATORY_CARE_PROVIDER_SITE_OTHER): Payer: Medicare Other

## 2013-07-20 DIAGNOSIS — M7989 Other specified soft tissue disorders: Secondary | ICD-10-CM | POA: Diagnosis not present

## 2013-07-20 DIAGNOSIS — I739 Peripheral vascular disease, unspecified: Secondary | ICD-10-CM

## 2013-07-21 NOTE — Progress Notes (Signed)
Mr. Porreca was seen today for an unna boot change to his rt LE. There was slight drainage but overall was showing improvement. The unna boot was removed and his leg was washed with soap and water. The new unna boot was applied with no difficulty. He will return for reapplication in one week. Chanda Maness-HarrisonCMA, AAMA

## 2013-07-27 ENCOUNTER — Ambulatory Visit (INDEPENDENT_AMBULATORY_CARE_PROVIDER_SITE_OTHER): Payer: Medicare Other

## 2013-07-27 DIAGNOSIS — M7989 Other specified soft tissue disorders: Secondary | ICD-10-CM | POA: Diagnosis not present

## 2013-07-28 NOTE — Progress Notes (Signed)
[name] was seen today for an [employer] change of his Right leg.   Wound and swelling were absent.  Pt had no complaints.  New Unna boot was applied with no difficulty.  He will return next week  For reapplication.   Marlaine Arey Eldridge-Lewis, RMA, AMT

## 2013-08-03 ENCOUNTER — Ambulatory Visit (INDEPENDENT_AMBULATORY_CARE_PROVIDER_SITE_OTHER): Payer: Medicare Other

## 2013-08-03 DIAGNOSIS — I739 Peripheral vascular disease, unspecified: Secondary | ICD-10-CM

## 2013-08-03 DIAGNOSIS — M7989 Other specified soft tissue disorders: Secondary | ICD-10-CM

## 2013-08-04 DIAGNOSIS — H3560 Retinal hemorrhage, unspecified eye: Secondary | ICD-10-CM | POA: Diagnosis not present

## 2013-08-04 DIAGNOSIS — H2589 Other age-related cataract: Secondary | ICD-10-CM | POA: Diagnosis not present

## 2013-08-04 DIAGNOSIS — H35369 Drusen (degenerative) of macula, unspecified eye: Secondary | ICD-10-CM | POA: Diagnosis not present

## 2013-08-07 NOTE — Progress Notes (Signed)
[name] was seen today for a unna boot change to his right LE. Pt had removed old unna boot and washed his leg prior to his visit. A new unna boot was applied without difficulty. He will return next week for reapplication. Britta Louth Maness-Harrison CMA, AAMA

## 2013-08-09 ENCOUNTER — Encounter (INDEPENDENT_AMBULATORY_CARE_PROVIDER_SITE_OTHER): Payer: Medicare Other

## 2013-08-09 DIAGNOSIS — M7989 Other specified soft tissue disorders: Secondary | ICD-10-CM | POA: Diagnosis not present

## 2013-08-10 DIAGNOSIS — H353290 Exudative age-related macular degeneration, unspecified eye, stage unspecified: Secondary | ICD-10-CM | POA: Diagnosis not present

## 2013-08-16 ENCOUNTER — Encounter: Payer: Self-pay | Admitting: Vascular Surgery

## 2013-08-17 ENCOUNTER — Ambulatory Visit (INDEPENDENT_AMBULATORY_CARE_PROVIDER_SITE_OTHER): Payer: Medicare Other | Admitting: Vascular Surgery

## 2013-08-17 ENCOUNTER — Encounter: Payer: Self-pay | Admitting: Vascular Surgery

## 2013-08-17 VITALS — BP 137/78 | HR 73 | Ht 72.0 in | Wt 263.9 lb

## 2013-08-17 DIAGNOSIS — L97909 Non-pressure chronic ulcer of unspecified part of unspecified lower leg with unspecified severity: Secondary | ICD-10-CM | POA: Insufficient documentation

## 2013-08-17 DIAGNOSIS — M7989 Other specified soft tissue disorders: Secondary | ICD-10-CM | POA: Diagnosis not present

## 2013-08-17 DIAGNOSIS — I87319 Chronic venous hypertension (idiopathic) with ulcer of unspecified lower extremity: Secondary | ICD-10-CM | POA: Diagnosis not present

## 2013-08-17 DIAGNOSIS — I87311 Chronic venous hypertension (idiopathic) with ulcer of right lower extremity: Secondary | ICD-10-CM

## 2013-08-17 HISTORY — DX: Chronic venous hypertension (idiopathic) with ulcer of unspecified lower extremity: L97.909

## 2013-08-17 NOTE — Progress Notes (Signed)
History of Present Illness:  Patient is a 79 y.o. year old male who returns for evaluation after 4 weeks of inability therapy for venous stasis ulcer. He had 2 falls recently and injured his right knee. He was seen by Dr. Thomasena Edis for this recently. His knee checked out okay. He was referred here by Dr. Thomasena Edis for further evaluation of his leg swelling. Patient previously had what sounds like laser ablation of his right and left greater saphenous veins by Dr. Donia Ast approximately 5 years ago. The leg swelling has now been present for 3-4 weeks. He has had chronic staining of the skin in both legs. He does have some itching and weeping in the right leg. He has no prior episodes of similar events. He denies claudication or rest pain. He does have compression stockings but has not been very compliant with these. Other medical problems include hyperlipidemia, coronary disease, obesity, hyperlipidemia all of which are currently stable.  He did have evidence of some incompetence of the right lesser saphenous vein on previous office visit.  Review of systems: He denies any skin itching. He denies any worsening of symptoms. He has no shortness of breath. The chest pain.  Physical exam:  Filed Vitals:   08/17/13 0854  BP: 137/78  Pulse: 73  Height: 6' (1.829 m)  Weight: 263 lb 14.4 oz (119.704 kg)  SpO2: 94%   Skin: 1-2 mm ulceration right medial calf but essentially healed persistent chronic staining and trace edema  Extremities: 2+ dorsalis pedis pulses bilaterally  Assessment: Healed venous stasis ulcer  Lab: Continued compression stockings bilateral lower extremities. Followup as needed.  Fabienne Bruns, MD [facility] [place] Office: [phone] Pager: [phone]

## 2013-10-10 DIAGNOSIS — Z23 Encounter for immunization: Secondary | ICD-10-CM | POA: Diagnosis not present

## 2013-11-30 DIAGNOSIS — H353290 Exudative age-related macular degeneration, unspecified eye, stage unspecified: Secondary | ICD-10-CM | POA: Diagnosis not present

## 2013-11-30 DIAGNOSIS — H353190 Nonexudative age-related macular degeneration, unspecified eye, stage unspecified: Secondary | ICD-10-CM | POA: Diagnosis not present

## 2014-04-05 DIAGNOSIS — Z125 Encounter for screening for malignant neoplasm of prostate: Secondary | ICD-10-CM | POA: Diagnosis not present

## 2014-04-05 DIAGNOSIS — Z6834 Body mass index (BMI) 34.0-34.9, adult: Secondary | ICD-10-CM | POA: Diagnosis not present

## 2014-04-05 DIAGNOSIS — Z79899 Other long term (current) drug therapy: Secondary | ICD-10-CM | POA: Diagnosis not present

## 2014-04-05 DIAGNOSIS — I2510 Atherosclerotic heart disease of native coronary artery without angina pectoris: Secondary | ICD-10-CM | POA: Diagnosis not present

## 2014-04-05 DIAGNOSIS — Z0000 Encounter for general adult medical examination without abnormal findings: Secondary | ICD-10-CM | POA: Diagnosis not present

## 2014-04-11 DIAGNOSIS — N390 Urinary tract infection, site not specified: Secondary | ICD-10-CM | POA: Diagnosis not present

## 2014-04-12 DIAGNOSIS — N390 Urinary tract infection, site not specified: Secondary | ICD-10-CM | POA: Diagnosis not present

## 2014-04-25 DIAGNOSIS — Z6834 Body mass index (BMI) 34.0-34.9, adult: Secondary | ICD-10-CM | POA: Diagnosis not present

## 2014-04-25 DIAGNOSIS — R609 Edema, unspecified: Secondary | ICD-10-CM | POA: Diagnosis not present

## 2014-04-25 DIAGNOSIS — L02818 Cutaneous abscess of other sites: Secondary | ICD-10-CM | POA: Diagnosis not present

## 2014-05-02 ENCOUNTER — Telehealth: Payer: Self-pay | Admitting: *Deleted

## 2014-05-02 ENCOUNTER — Other Ambulatory Visit: Payer: Self-pay | Admitting: *Deleted

## 2014-05-02 DIAGNOSIS — R600 Localized edema: Secondary | ICD-10-CM

## 2014-05-02 DIAGNOSIS — M7989 Other specified soft tissue disorders: Secondary | ICD-10-CM

## 2014-05-02 DIAGNOSIS — M79609 Pain in unspecified limb: Secondary | ICD-10-CM

## 2014-05-02 NOTE — Telephone Encounter (Signed)
Patient called to report that his right lower leg was swollen x 4 days, increasing since yesterday. He reports no erythema or drainage but does think he has an open "crack" over his ankle. He is afebrile but states that he went to Dr. Nolon Rod office 4 days ago and they Rx'd antibiotics for cellulitis. He is trying to wear his compression hose and elevating his leg as much as possible but his swelling is getting worse. Patient saw Dr. Oneida Alar last year for venous stasis ulcers and was treated with unna boots X 1 month. I spoke to Dr. Scot Dock and he recommended that the patient come in tomorrow for venous reflux and see our NP to rule out DVT. Patient voiced agreement with this plan.

## 2014-05-03 ENCOUNTER — Encounter: Payer: Self-pay | Admitting: Family

## 2014-05-03 ENCOUNTER — Ambulatory Visit (INDEPENDENT_AMBULATORY_CARE_PROVIDER_SITE_OTHER): Payer: Medicare Other | Admitting: Family

## 2014-05-03 ENCOUNTER — Other Ambulatory Visit: Payer: Self-pay | Admitting: Vascular Surgery

## 2014-05-03 ENCOUNTER — Ambulatory Visit (HOSPITAL_COMMUNITY)
Admission: RE | Admit: 2014-05-03 | Discharge: 2014-05-03 | Disposition: A | Discharge status: Home / Self Care | Payer: Medicare Other | Source: Ambulatory Visit | Attending: Family | Admitting: Family

## 2014-05-03 VITALS — BP 110/67 | HR 65 | Resp 16 | Ht 72.0 in | Wt 247.0 lb

## 2014-05-03 DIAGNOSIS — M7989 Other specified soft tissue disorders: Secondary | ICD-10-CM

## 2014-05-03 DIAGNOSIS — M79609 Pain in unspecified limb: Secondary | ICD-10-CM

## 2014-05-03 DIAGNOSIS — L819 Disorder of pigmentation, unspecified: Secondary | ICD-10-CM | POA: Diagnosis not present

## 2014-05-03 DIAGNOSIS — L0390 Cellulitis, unspecified: Secondary | ICD-10-CM | POA: Diagnosis not present

## 2014-05-03 DIAGNOSIS — L0291 Cutaneous abscess, unspecified: Secondary | ICD-10-CM

## 2014-05-03 MED ORDER — CEPHALEXIN 500 MG PO CAPS: 500.0000 mg | ORAL_CAPSULE | Freq: Four times a day (QID) | ORAL | Status: DC

## 2014-05-03 NOTE — Patient Instructions (Signed)
Venous Stasis or Chronic Venous Insufficiency Chronic venous insufficiency, also called venous stasis, is a condition that affects the veins in the legs. The condition prevents blood from being pumped through these veins effectively. Blood may no longer be pumped effectively from the legs back to the heart. This condition can range from mild to severe. With proper treatment, you should be able to continue with an active life. CAUSES  Chronic venous insufficiency occurs when the vein walls become stretched, weakened, or damaged or when valves within the vein are damaged. Some common causes of this include:  High blood pressure inside the veins (venous hypertension).  Increased blood pressure in the leg veins from long periods of sitting or standing.  A blood clot that blocks blood flow in a vein (deep vein thrombosis).  Inflammation of a superficial vein (phlebitis) that causes a blood clot to form. RISK FACTORS Various things can make you more likely to develop chronic venous insufficiency, including:  Family history of this condition.  Obesity.  Pregnancy.  Sedentary lifestyle.  Smoking.  Jobs requiring long periods of standing or sitting in one place.  Being a certain age. Women in their 44s and 91s and men in their 56s are more likely to develop this condition. SIGNS AND SYMPTOMS  Symptoms may include:   Varicose veins.  Skin breakdown or ulcers.  Reddened or discolored skin on the leg.  Brown, smooth, tight, and painful skin just above the ankle, usually on the inside surface (lipodermatosclerosis).  Swelling. DIAGNOSIS  To diagnose this condition, your health care provider will take a medical history and do a physical exam. The following tests may be ordered to confirm the diagnosis:  Duplex ultrasound A procedure that produces a picture of a blood vessel and nearby organs and also provides information on blood flow through the blood vessel.  Plethysmography A  procedure that tests blood flow.  A venogram, or venography A procedure used to look at the veins using X-ray and dye. TREATMENT The goals of treatment are to help you return to an active life and to minimize pain or disability. Treatment will depend on the severity of the condition. Medical procedures may be needed for severe cases. Treatment options may include:   Use of compression stockings. These can help with symptoms and lower the chances of the problem getting worse, but they do not cure the problem.  Sclerotherapy A procedure involving an injection of a material that "dissolves" the damaged veins. Other veins in the network of blood vessels take over the function of the damaged veins.  Surgery to remove the vein or cut off blood flow through the vein (vein stripping or laser ablation surgery).  Surgery to repair a valve. HOME CARE INSTRUCTIONS   Wear compression stockings as directed by your health care provider.  Only take over-the-counter or prescription medicines for pain, discomfort, or fever as directed by your health care provider.  Follow up with your health care provider as directed. SEEK MEDICAL CARE IF:   You have redness, swelling, or increasing pain in the affected area.  You see a red streak or line that extends up or down from the affected area.  You have a breakdown or loss of skin in the affected area, even if the breakdown is small.  You have an injury to the affected area. SEEK IMMEDIATE MEDICAL CARE IF:   You have an injury and open wound in the affected area.  Your pain is severe and does not improve with  medicine.  You have sudden numbness or weakness in the foot or ankle below the affected area, or you have trouble moving your foot or ankle.  You have a fever or persistent symptoms for more than 2 3 days.  You have a fever and your symptoms suddenly get worse. MAKE SURE YOU:   Understand these instructions.  Will watch your condition.  Will  get help right away if you are not doing well or get worse. Document Released: 04/19/2007 Document Revised: 10/04/2013 Document Reviewed: 08/21/2013 [facility] Patient Information 2014 [place].

## 2014-05-03 NOTE — Progress Notes (Signed)
Established Venous Insufficiency  History of Present Illness  Caleb [name] is a 80 y.o. ([birth year]) male patient of Dr. Oneida Alar.  He had 2 falls in 2014 and injured his right knee. He was seen by Dr. Theda Sers for this. His knee checked out okay. He was referred here by Dr. Theda Sers for further evaluation of his leg swelling. Patient previously had what sounds like laser ablation of his right and left greater saphenous veins by Dr. Eilleen Kempf approximately 5 years ago. The leg swelling has now been present for 3-4 weeks. He has had chronic staining of the skin in both legs. He does have some itching and weeping in the right leg. He has no prior episodes of similar events. He denies claudication or rest pain. He does have compression stockings but has not been very compliant with these. Other medical problems include hyperlipidemia, coronary disease, obesity, hyperlipidemia all of which are currently stable. He did have evidence of some incompetence of the right lesser saphenous vein on previous office visit. He returns today for left Leg venous stasis ulcer, swelling, discoloration and slight pain, duration on going 4-6 wks, he denies injury. He is taking cipro and Bactrim. Pt denies history of stroke, TIA, or CAD. He states he is elevating his legs when not walking and does not walk much, no reason, no barriers.   Past Medical History  Diagnosis Date  . Hyperlipidemia   . Coronary artery disease   . Obesity   . Hyperlipidemia   . Swelling of extremity     Left Leg   Past Surgical History  Procedure Laterality Date  . Appendectomy    . Cholecystectomy    . Coronary artery bypass graft    . Hiatal hernia repair    . Back surgery     History   Social History  . Marital Status: Divorced    Spouse Name: N/A    Number of Children: N/A  . Years of Education: N/A   Occupational History  . retired    Social History Main Topics  . Smoking status: Former Smoker    Types: Cigarettes    Quit date: 07/14/1983  . Smokeless tobacco: Never Used  . Alcohol Use: No  . Drug Use: No  . Sexual Activity: Not on file   Other Topics Concern  . Not on file   Social History Narrative  . No narrative on file   Family History  Problem Relation Age of Onset  . Cancer    . Cancer Father   . Cancer Sister   . Cancer Brother    Current Outpatient Prescriptions on File Prior to Visit  Medication Sig Dispense Refill  . aspirin 81 MG tablet Take 81 mg by mouth daily.        . clopidogrel (PLAVIX) 75 MG tablet       . metoprolol tartrate (LOPRESSOR) 25 MG tablet       . simvastatin (ZOCOR) 20 MG tablet        No current facility-administered medications on file prior to visit.   No Known Allergies   REVIEW OF SYSTEMS: Cardiovascular: No chest pain, chest pressure, palpitations, orthopnea, or dyspnea on exertion. No claudication or rest pain,  No history of DVT or phlebitis. Pulmonary: No productive cough, asthma or wheezing. Neurologic: No weakness, paresthesias, aphasia, or amaurosis. No dizziness. Hematologic: No bleeding problems or clotting disorders. Musculoskeletal: No joint pain or joint swelling. Gastrointestinal: No blood in stool or hematemesis Genitourinary: No dysuria  or hematuria. Psychiatric:: No history of major depression. Integumentary: No rashes or ulcers. Constitutional: No fever or chills.   Physical Examination  Filed Vitals:   05/03/14 1402  BP: 110/67  Pulse: 65  Resp: 16  Height: 6' (1.829 m)  Weight: 247 lb (112.038 kg)  SpO2: 95%   Body mass index is 33.49 kg/(m^2).  PHYSICAL EXAMINATION: General: The patient appears their stated age, obese male.   HEENT:  No gross abnormalities Pulmonary: Respirations are non-labored Abdomen: Soft and non-tender. Musculoskeletal: There are no major deformities.   Neurologic: No focal weakness or paresthesias are detected, Skin: There are no ulcer or rashes noted, extensive staining in both lower  legs, 7 cm soft raised non draining lesion, is red, mildly tender to palpation. Pitting edema in both lower legs: 2+ right, 3+ left. Psychiatric: The patient has normal affect. Cardiovascular: There is a regular rate and rhythm without significant murmur appreciated.    Vascular: Vessel Right Left  Radial Palpable Palpable  Aorta Not palpable N/A  Femoral Palpable Palpable  Popliteal Not palpable Not palpable  PT 2+ Palpable 2+ Palpable  DP 2+ Palpable 2+ Palpable   Non-Invasive Vascular Imaging  BLE Venous Duplex (Date: 05/03/2014):  LOWER EXTREMITY VENOUS DUPLEX REFLUX EVALUATION    INDICATION: Left lower extremity edema and pain    INTERVENTION(S): Left great saphenous vein ablation at outside facility    DUPLEX EXAM:     RIGHT VEIN LEFT  Reflux > 500 ms Thrombosis  Reflux > 500 ms Thrombosis  Y N Common Femoral Vein Y N  N N Femoral Vein N N  N N Popliteal Vein N N  Y N Saphenofemoral Junction Absent N  Y N Great Saphenous Vein See below N  N N Small Saphenous Vein N N    Thrombosis:  N = None, A = Acute, C = Chronic, O = Obstructive, P = Partially Obstructive                                        Reflux:  N = None, Y = Yes       See attached diagram(s) for additional diagnostic information.    NOTE:  Incompetence is defined as continuous reflux > 500 milliseconds with distal augmentation maneuvers or valsalva     ADDITIONAL FINDINGS: The right great saphenous vein is absent in the proximal thigh but present with reflux from the calf to the distal thigh.    IMPRESSION: 1. No evidence of deep venous thrombosis or superficial venous thrombophlebitis in the left lower extremity. 2. Evidence of superficial venous reflux in the remaining left great saphenous vein. 3. There is a superficial, non-vascularized cystic structure in the area of patient's concern at the left lateral ankle which appears to be filled with complex fluid. Structure measured 7.6cm with increased  vascularization and edema surrounding the area.    Medical Decision Making  Caleb [name] is a 80 y.o. male who presents with: bilateral lower leg chronic venous insufficiency and a contained area of mild cellulitis at lateral aspect left lower leg.  Based on the patient's vascular studies and examination, and after discussing with Dr. Oneida Alar and Dr. Oneida Alar examined and spoke with pt,  have offered the patient: Keflex 500 mg four times/day x 2 weeks, stop Cipro and Bactrim DS.  Return to clinic in 2 weeks, call for problems or concerns.  I  discussed with the patient the use of  20-30 mm knee high compression stockings and need for 3 month trial of such, he cannot put on thigh high stockings  Thank you for allowing [place] to participate in this patient's care.  Sharmon Leyden Rondia Higginbotham, RN, MSN, FNP-C [facility] Office: [phone]  05/03/2014, 2:16 PM  Clinic MD: Oneida Alar

## 2014-05-16 ENCOUNTER — Encounter: Payer: Self-pay | Admitting: Family

## 2014-05-17 ENCOUNTER — Encounter: Payer: Self-pay | Admitting: Family

## 2014-05-17 ENCOUNTER — Ambulatory Visit (INDEPENDENT_AMBULATORY_CARE_PROVIDER_SITE_OTHER): Payer: Medicare Other | Admitting: Family

## 2014-05-17 VITALS — BP 139/71 | HR 64 | Resp 20 | Ht 72.0 in | Wt 259.5 lb

## 2014-05-17 DIAGNOSIS — I739 Peripheral vascular disease, unspecified: Secondary | ICD-10-CM | POA: Diagnosis not present

## 2014-05-17 DIAGNOSIS — L0291 Cutaneous abscess, unspecified: Secondary | ICD-10-CM

## 2014-05-17 DIAGNOSIS — M7989 Other specified soft tissue disorders: Secondary | ICD-10-CM | POA: Diagnosis not present

## 2014-05-17 DIAGNOSIS — L0390 Cellulitis, unspecified: Secondary | ICD-10-CM

## 2014-05-17 HISTORY — DX: Cellulitis, unspecified: L03.90

## 2014-05-17 NOTE — Progress Notes (Signed)
Established Venous Insufficiency  History of Present Illness  Caleb [name] is a 80 y.o. (27-Dec-1925) male male patient of Dr. Oneida Alar.  He had 2 falls in 2014 and injured his right knee. He was seen by Dr. Theda Sers for this. His knee checked out okay. He was referred here by Dr. Theda Sers for further evaluation of his leg swelling. Patient previously had what sounds like laser ablation of his right and left greater saphenous veins by Dr. Eilleen Kempf approximately 5 years ago. The leg swelling had been present for 3-4 weeks. He had had chronic staining of the skin in both legs. He had some itching and weeping in the right leg.  He denies claudication or rest pain. Pt denies history of stroke, TIA, or CAD.   He does have compression stockings but has not been very compliant with these. Other medical problems include hyperlipidemia, coronary disease, obesity, hyperlipidemia all of which are currently stable. He did have evidence of some incompetence of the right lesser saphenous vein on previous office visit.   He returns today for 2 week follow up of left Leg venous stasis ulcer, swelling, discoloration and slight pain, duration on going 4-6 wks, he denies injury.  The stasis ulcer has healed, the left lower leg redness has almost resolved, and he has no more left lower leg pain He is finishing the 2 week course of cephalexin.  He states he is elevating his legs when not walking and does not walk much, no reason, no barriers.  On ROS today: See HPI for pertinent positives and negatives.  Physical Examination  Filed Vitals:   05/17/14 1255  BP: 139/71  Pulse: 64  Resp: 20  Height: 6' (1.829 m)  Weight: 259 lb 8 oz (117.708 kg)   Body mass index is 35.19 kg/(m^2).  PHYSICAL EXAMINATION:  General: The patient appears their stated age, obese male.  HEENT: No gross abnormalities  Pulmonary: Respirations are non-labored  Abdomen: Soft and non-tender.  Musculoskeletal: There are no major  deformities.  Neurologic: No focal weakness or paresthesias are detected,  Skin: There are no ulcer or rashes noted, extensive staining in both lower legs, 7 cm soft raised non draining lesion, is red, mildly tender to palpation.  Pitting edema in both lower legs are less than 2 weeks ago: trace right, 1+ left. Pt is wearing his knee high graduated compression hose, removed for examination. Psychiatric: The patient has normal affect.  Cardiovascular: There is a regular rate and rhythm.  Vascular:  Vessel  Right  Left   Radial  Palpable  Palpable   Aorta  Not palpable  N/A   Popliteal  Not palpable  Not palpable   PT  2+ Palpable  2+ Palpable   DP  2+ Palpable  2+ Palpable    Non-Invasive Vascular Imaging  BLE Venous Duplex (Date: 05/03/2014):  LOWER EXTREMITY VENOUS DUPLEX REFLUX EVALUATION     INDICATION:  Left lower extremity edema and pain     INTERVENTION(S):  Left great saphenous vein ablation at outside facility     DUPLEX EXAM:      RIGHT  VEIN  LEFT   Reflux > 500 ms  Thrombosis   Reflux > 500 ms  Thrombosis   Y  N  Common Femoral Vein  Y  N   N  N  Femoral Vein  N  N   N  N  Popliteal Vein  N  N   Y  N  Saphenofemoral Junction  Absent  N   Y  N  Great Saphenous Vein  See below  N   N  N  Small Saphenous Vein  N  N     Thrombosis: N = None, A = Acute, C = Chronic, O = Obstructive, P = Partially Obstructive  Reflux: N = None, Y = Yes     See attached diagram(s) for additional diagnostic information.     NOTE: Incompetence is defined as continuous reflux > 500 milliseconds with distal augmentation maneuvers or valsalva     ADDITIONAL FINDINGS:  The right great saphenous vein is absent in the proximal thigh but present with reflux from the calf to the distal thigh.     IMPRESSION:  1. No evidence of deep venous thrombosis or superficial venous thrombophlebitis in the left lower extremity. 2. Evidence of superficial venous reflux in the remaining left great saphenous  vein. There is a superficial, non-vascularized cystic structure in the area of patient's concern at the left lateral ankle which appears to be filled with complex fluid. Structure measured 7.6cm with increased vascularization and edema surrounding the area.     Medical Decision Making   Caleb [name] is a 81 y.o. male who presents with: bilateral leg chronic venous insufficiency. His left lower leg ulcer has healed after almost 2 weeks on cephalexin, elevation of legs when not walking, and wearing knee high graduated compression stockings during the day.  Based on the patient's lower extremity venous Duplex reflux evaluation  from 2 weeks ago and examination today, and after discussing with Dr. Oneida Alar, I have offered the patient conservative treatment: walking as much as possible, elevation of legs when not walking, wear graduated knee high compression hose during the day, remove at bedtime. He was advised to notify our office should he develop ulcers or signs of cellulitis.   Follow up with Dr. Oneida Alar in 6 months for evaluation of status of chronic venous insufficiency.     Thank you for allowing [place] to participate in this patient's care.  Clemon Chambers, RN, MSN, FNP-C [facility] Office: [phone]  Clinic MD: Oneida Alar  05/17/2014, 1:19 PM

## 2014-05-31 DIAGNOSIS — H353290 Exudative age-related macular degeneration, unspecified eye, stage unspecified: Secondary | ICD-10-CM | POA: Diagnosis not present

## 2014-06-12 ENCOUNTER — Encounter: Payer: Self-pay | Admitting: Cardiovascular Disease

## 2014-06-12 ENCOUNTER — Ambulatory Visit (INDEPENDENT_AMBULATORY_CARE_PROVIDER_SITE_OTHER): Payer: Medicare Other | Admitting: Cardiovascular Disease

## 2014-06-12 VITALS — BP 142/76 | HR 83 | Ht 72.0 in | Wt 257.0 lb

## 2014-06-12 DIAGNOSIS — I2510 Atherosclerotic heart disease of native coronary artery without angina pectoris: Secondary | ICD-10-CM

## 2014-06-12 DIAGNOSIS — Z79899 Other long term (current) drug therapy: Secondary | ICD-10-CM | POA: Diagnosis not present

## 2014-06-12 DIAGNOSIS — E782 Mixed hyperlipidemia: Secondary | ICD-10-CM

## 2014-06-12 MED ORDER — SIMVASTATIN 20 MG PO TABS: 20.0000 mg | ORAL_TABLET | Freq: Every day | ORAL | Status: DC

## 2014-06-12 NOTE — Assessment & Plan Note (Signed)
History of CAD status post RCA PCI stenting with a Cypher drug-eluting stent December 2006. He also had a 50% mid LAD lesion and normal LV function. His last Myoview performed 11/26/09 was nonischemic. He denies chest pain or shortness of breath.

## 2014-06-12 NOTE — Progress Notes (Signed)
06/12/2014 Caleb [name]   Jun 16, 1925  [id number]  Primary Physician Glo Herring., MD Primary Cardiologist: Lorretta Harp MD Renae Gloss   HPI:  The patient is an 80 year old, overweight Caucasian male with a history of coronary artery disease status post RCA PCI and stenting with a Cypher drug-eluting stent December 2006. He also had a 50% mid LAD lesion with normal LV function at that time. His last 2D echocardiogram was in 2006 as well which showed normal LV function possibly at the lower limits of normal. He has mild to moderate aortic sclerosis without stenosis. Mild to moderate mitral annular calcification. Left atrium was mildly dilated. He also has a history of hyperlipidemia.   The patient presents today with complaints of paresthesia or numbness and tingling in his upper extremities typically right before he wakes up. He also has the problem intermittently in his lower extremities. He does not have these issues during the day and it is not until after he has been asleep and then wakes up that he has these problems. Once he gets up from sleeping and he starts moving around the symptoms go away. He also has a history of ruptured disk somewhere in the lower back approximately 5 to 6 years ago. He has had surgery for that. He also reports that he fell, essentially tripped over a slipper and fell to his right knee approximately 2.5 weeks ago. This has caused swelling in his right lower extremity since then. He did have a lower extremity venous Doppler on April 20, 2013, which showed no evidence of lower extremity deep vein thrombosis. He denies nausea, vomiting, fever, lightheadedness, dizziness, shortness of breath, orthopnea, paroxysmal nocturnal dyspnea, chest pain, claudication, abdominal pain.  He did see Dr. Hart Robinsons, orthopedic surgeon, since he was seen last for evaluation of bilateral upper extremity numbness which apparently was thought to be either carpal  tunnel syndrome or cervical radiculopathy.he has right lower extremity swelling apparently had venous Dopplers but that did not show DVT.   I saw him back one year ago. Since that time he denies chest pain or shortness of breath.    Current Outpatient Prescriptions  Medication Sig Dispense Refill  . aspirin 81 MG tablet Take 81 mg by mouth daily.        . metoprolol tartrate (LOPRESSOR) 25 MG tablet       . simvastatin (ZOCOR) 20 MG tablet Take 1 tablet (20 mg total) by mouth daily at 6 PM.  90 tablet  3   No current facility-administered medications for this visit.    No Known Allergies  History   Social History  . Marital Status: Divorced    Spouse Name: N/A    Number of Children: N/A  . Years of Education: N/A   Occupational History  . retired    Social History Main Topics  . Smoking status: Former Smoker    Types: Cigarettes    Quit date: 07/14/1983  . Smokeless tobacco: Never Used  . Alcohol Use: No  . Drug Use: No  . Sexual Activity: Not on file   Other Topics Concern  . Not on file   Social History Narrative  . No narrative on file     Review of Systems: General: negative for chills, fever, night sweats or weight changes.  Cardiovascular: negative for chest pain, dyspnea on exertion, edema, orthopnea, palpitations, paroxysmal nocturnal dyspnea or shortness of breath Dermatological: negative for rash Respiratory: negative for cough or wheezing Urologic: negative  for hematuria Abdominal: negative for nausea, vomiting, diarrhea, bright red blood per rectum, melena, or hematemesis Neurologic: negative for visual changes, syncope, or dizziness All other systems reviewed and are otherwise negative except as noted above.    Blood pressure 142/76, pulse 83, height 6' (1.829 m), weight 257 lb (116.574 kg).  General appearance: alert and no distress Neck: no adenopathy, no carotid bruit, no JVD, supple, symmetrical, trachea midline and thyroid not enlarged,  symmetric, no tenderness/mass/nodules Lungs: clear to auscultation bilaterally Heart: regular rate and rhythm, S1, S2 normal, no murmur, click, rub or gallop Extremities: extremities normal, atraumatic, no cyanosis or edema  EKG normal sinus rhythm at 83 without ST or T wave changes  ASSESSMENT AND PLAN:   Hyperlipidemia On statin therapy followed by his PCP  Coronary artery disease History of CAD status post RCA PCI stenting with a Cypher drug-eluting stent December 2006. He also had a 50% mid LAD lesion and normal LV function. His last Myoview performed 11/26/09 was nonischemic. He denies chest pain or shortness of breath.      Lorretta Harp MD FACP,FACC,FAHA, [facility] 06/12/2014 3:55 PM

## 2014-06-12 NOTE — Patient Instructions (Signed)
  We will see you back in follow up in 1 year with Dr Berry  Dr Berry has ordered blood work to be done, fasting   

## 2014-06-12 NOTE — Assessment & Plan Note (Signed)
On statin therapy followed by his PCP 

## 2014-06-15 DIAGNOSIS — E782 Mixed hyperlipidemia: Secondary | ICD-10-CM | POA: Diagnosis not present

## 2014-06-15 DIAGNOSIS — Z79899 Other long term (current) drug therapy: Secondary | ICD-10-CM | POA: Diagnosis not present

## 2014-06-16 LAB — HEPATIC FUNCTION PANEL
ALK PHOS: 50 U/L (ref 39–117)
ALT: 23 U/L (ref 0–53)
AST: 23 U/L (ref 0–37)
Albumin: 3.9 g/dL (ref 3.5–5.2)
Bilirubin, Direct: 0.2 mg/dL (ref 0.0–0.3)
Indirect Bilirubin: 0.5 mg/dL (ref 0.2–1.2)
TOTAL PROTEIN: 6.7 g/dL (ref 6.0–8.3)
Total Bilirubin: 0.7 mg/dL (ref 0.2–1.2)

## 2014-06-16 LAB — LIPID PANEL
Cholesterol: 121 mg/dL (ref 0–200)
HDL: 42 mg/dL (ref >39)
LDL CALC: 67 mg/dL (ref 0–99)
TRIGLYCERIDES: 59 mg/dL (ref <150)
Total CHOL/HDL Ratio: 2.9 Ratio
VLDL: 12 mg/dL (ref 0–40)

## 2014-06-19 ENCOUNTER — Encounter: Payer: Self-pay | Admitting: *Deleted

## 2014-06-19 DIAGNOSIS — N342 Other urethritis: Secondary | ICD-10-CM | POA: Diagnosis not present

## 2014-06-21 DIAGNOSIS — N4829 Other inflammatory disorders of penis: Secondary | ICD-10-CM | POA: Diagnosis not present

## 2014-07-12 DIAGNOSIS — N342 Other urethritis: Secondary | ICD-10-CM | POA: Diagnosis not present

## 2014-08-29 DIAGNOSIS — N342 Other urethritis: Secondary | ICD-10-CM | POA: Diagnosis not present

## 2014-08-29 DIAGNOSIS — N390 Urinary tract infection, site not specified: Secondary | ICD-10-CM | POA: Diagnosis not present

## 2014-09-05 DIAGNOSIS — N342 Other urethritis: Secondary | ICD-10-CM | POA: Diagnosis not present

## 2014-10-31 DIAGNOSIS — E6609 Other obesity due to excess calories: Secondary | ICD-10-CM | POA: Diagnosis not present

## 2014-10-31 DIAGNOSIS — J029 Acute pharyngitis, unspecified: Secondary | ICD-10-CM | POA: Diagnosis not present

## 2014-10-31 DIAGNOSIS — Z6833 Body mass index (BMI) 33.0-33.9, adult: Secondary | ICD-10-CM | POA: Diagnosis not present

## 2014-11-07 DIAGNOSIS — R0981 Nasal congestion: Secondary | ICD-10-CM | POA: Diagnosis not present

## 2014-11-07 DIAGNOSIS — H6123 Impacted cerumen, bilateral: Secondary | ICD-10-CM | POA: Diagnosis not present

## 2014-11-07 DIAGNOSIS — R0982 Postnasal drip: Secondary | ICD-10-CM | POA: Diagnosis not present

## 2014-11-07 DIAGNOSIS — J029 Acute pharyngitis, unspecified: Secondary | ICD-10-CM | POA: Diagnosis not present

## 2014-11-21 DIAGNOSIS — Z23 Encounter for immunization: Secondary | ICD-10-CM | POA: Diagnosis not present

## 2014-11-28 ENCOUNTER — Encounter: Payer: Self-pay | Admitting: Vascular Surgery

## 2014-11-29 ENCOUNTER — Ambulatory Visit (INDEPENDENT_AMBULATORY_CARE_PROVIDER_SITE_OTHER): Payer: Medicare Other | Admitting: Vascular Surgery

## 2014-11-29 ENCOUNTER — Encounter: Payer: Self-pay | Admitting: Vascular Surgery

## 2014-11-29 VITALS — BP 135/75 | HR 65 | Resp 18 | Ht 72.0 in | Wt 254.1 lb

## 2014-11-29 DIAGNOSIS — I872 Venous insufficiency (chronic) (peripheral): Secondary | ICD-10-CM | POA: Diagnosis not present

## 2014-11-29 DIAGNOSIS — I2510 Atherosclerotic heart disease of native coronary artery without angina pectoris: Secondary | ICD-10-CM | POA: Diagnosis not present

## 2014-11-29 NOTE — Progress Notes (Signed)
Patient is an 80 year old male that we have been following for intermittent exacerbations of venous stasis disease. He returns today for further follow-up. He denies any new ulcerations at this time. He is wearing his compression stockings.  He has had previous laser ablation of the greater saphenous vein from the thigh to the knee bilaterally. This was done at [facility].  Review of systems: He denies shortness of breath. He denies chest pain.  Past Medical History  Diagnosis Date  . Hyperlipidemia   . Coronary artery disease   . Obesity   . Hyperlipidemia   . Swelling of extremity     Left Leg   Physical exam:  Filed Vitals:   11/29/14 1232  BP: 135/75  Pulse: 65  Resp: 18  Height: 6' (1.829 m)  Weight: 254 lb 1.6 oz (115.259 kg)    Extremities: 2+ femoral dorsalis pedis posterior tibial pulses bilaterally, brawny venous staining circumferentially mid calf to foot bilaterally, no ulcerations  Data: Venous reflux exam dated 05/03/2014 is reviewed this shows reflux in the saphenous vein below the knee bilaterally.  Assessment: Chronic venous stasis with reflux bilaterally. Currently asymptomatic.  Plan: Continue wearing compression stockings. Follow-up as needed.  Ruta Hinds, MD [facility] Office: [phone] Pager: [phone]

## 2014-12-06 DIAGNOSIS — H3532 Exudative age-related macular degeneration: Secondary | ICD-10-CM | POA: Diagnosis not present

## 2014-12-06 DIAGNOSIS — H3531 Nonexudative age-related macular degeneration: Secondary | ICD-10-CM | POA: Diagnosis not present

## 2014-12-07 ENCOUNTER — Other Ambulatory Visit: Payer: Self-pay | Admitting: Urology

## 2014-12-07 ENCOUNTER — Ambulatory Visit (INDEPENDENT_AMBULATORY_CARE_PROVIDER_SITE_OTHER): Payer: Medicare Other | Admitting: Urology

## 2014-12-07 DIAGNOSIS — N410 Acute prostatitis: Secondary | ICD-10-CM | POA: Diagnosis not present

## 2014-12-07 DIAGNOSIS — C609 Malignant neoplasm of penis, unspecified: Secondary | ICD-10-CM | POA: Diagnosis not present

## 2014-12-12 ENCOUNTER — Ambulatory Visit (HOSPITAL_COMMUNITY)
Admission: RE | Admit: 2014-12-12 | Discharge: 2014-12-12 | Disposition: A | Discharge status: Home / Self Care | Payer: Medicare Other | Source: Ambulatory Visit | Attending: Urology | Admitting: Urology

## 2014-12-12 DIAGNOSIS — C609 Malignant neoplasm of penis, unspecified: Secondary | ICD-10-CM

## 2014-12-12 DIAGNOSIS — R599 Enlarged lymph nodes, unspecified: Secondary | ICD-10-CM | POA: Diagnosis not present

## 2014-12-12 DIAGNOSIS — N433 Hydrocele, unspecified: Secondary | ICD-10-CM | POA: Diagnosis not present

## 2014-12-12 LAB — POCT I-STAT CREATININE: Creatinine, Ser: 1.2 mg/dL (ref 0.50–1.35)

## 2014-12-12 MED ORDER — GADOBENATE DIMEGLUMINE 529 MG/ML IV SOLN
20.0000 mL | Freq: Once | INTRAVENOUS | Status: AC | PRN
  Administered 2014-12-12: 20 mL via INTRAVENOUS

## 2015-01-09 DIAGNOSIS — C609 Malignant neoplasm of penis, unspecified: Secondary | ICD-10-CM | POA: Diagnosis not present

## 2015-01-09 DIAGNOSIS — Z8549 Personal history of malignant neoplasm of other male genital organs: Secondary | ICD-10-CM

## 2015-01-09 DIAGNOSIS — Z79899 Other long term (current) drug therapy: Secondary | ICD-10-CM | POA: Diagnosis not present

## 2015-01-09 HISTORY — DX: Personal history of malignant neoplasm of other male genital organs: Z85.49

## 2015-01-16 DIAGNOSIS — R590 Localized enlarged lymph nodes: Secondary | ICD-10-CM | POA: Diagnosis not present

## 2015-01-16 DIAGNOSIS — C609 Malignant neoplasm of penis, unspecified: Secondary | ICD-10-CM | POA: Diagnosis not present

## 2015-01-30 DIAGNOSIS — R590 Localized enlarged lymph nodes: Secondary | ICD-10-CM | POA: Diagnosis not present

## 2015-01-30 DIAGNOSIS — I2510 Atherosclerotic heart disease of native coronary artery without angina pectoris: Secondary | ICD-10-CM | POA: Diagnosis not present

## 2015-01-30 DIAGNOSIS — E784 Other hyperlipidemia: Secondary | ICD-10-CM | POA: Diagnosis not present

## 2015-01-30 DIAGNOSIS — C609 Malignant neoplasm of penis, unspecified: Secondary | ICD-10-CM | POA: Diagnosis not present

## 2015-02-04 DIAGNOSIS — I2510 Atherosclerotic heart disease of native coronary artery without angina pectoris: Secondary | ICD-10-CM | POA: Diagnosis not present

## 2015-02-04 DIAGNOSIS — E559 Vitamin D deficiency, unspecified: Secondary | ICD-10-CM | POA: Diagnosis not present

## 2015-02-04 DIAGNOSIS — N400 Enlarged prostate without lower urinary tract symptoms: Secondary | ICD-10-CM | POA: Diagnosis not present

## 2015-02-04 DIAGNOSIS — C609 Malignant neoplasm of penis, unspecified: Secondary | ICD-10-CM | POA: Diagnosis not present

## 2015-02-04 DIAGNOSIS — N319 Neuromuscular dysfunction of bladder, unspecified: Secondary | ICD-10-CM | POA: Diagnosis present

## 2015-02-04 DIAGNOSIS — M6281 Muscle weakness (generalized): Secondary | ICD-10-CM | POA: Diagnosis not present

## 2015-02-04 DIAGNOSIS — C608 Malignant neoplasm of overlapping sites of penis: Secondary | ICD-10-CM | POA: Diagnosis not present

## 2015-02-04 DIAGNOSIS — R599 Enlarged lymph nodes, unspecified: Secondary | ICD-10-CM | POA: Diagnosis not present

## 2015-02-04 DIAGNOSIS — N312 Flaccid neuropathic bladder, not elsewhere classified: Secondary | ICD-10-CM | POA: Diagnosis not present

## 2015-02-04 DIAGNOSIS — N342 Other urethritis: Secondary | ICD-10-CM | POA: Diagnosis not present

## 2015-02-04 DIAGNOSIS — R7889 Finding of other specified substances, not normally found in blood: Secondary | ICD-10-CM | POA: Diagnosis not present

## 2015-02-04 DIAGNOSIS — M21372 Foot drop, left foot: Secondary | ICD-10-CM | POA: Diagnosis not present

## 2015-02-04 DIAGNOSIS — E785 Hyperlipidemia, unspecified: Secondary | ICD-10-CM | POA: Diagnosis not present

## 2015-02-04 DIAGNOSIS — M79642 Pain in left hand: Secondary | ICD-10-CM | POA: Diagnosis not present

## 2015-02-04 DIAGNOSIS — R2681 Unsteadiness on feet: Secondary | ICD-10-CM | POA: Diagnosis not present

## 2015-02-07 DIAGNOSIS — R569 Unspecified convulsions: Secondary | ICD-10-CM | POA: Diagnosis not present

## 2015-02-07 DIAGNOSIS — E785 Hyperlipidemia, unspecified: Secondary | ICD-10-CM | POA: Diagnosis not present

## 2015-02-07 DIAGNOSIS — N400 Enlarged prostate without lower urinary tract symptoms: Secondary | ICD-10-CM | POA: Diagnosis not present

## 2015-02-07 DIAGNOSIS — R2681 Unsteadiness on feet: Secondary | ICD-10-CM | POA: Diagnosis not present

## 2015-02-07 DIAGNOSIS — R599 Enlarged lymph nodes, unspecified: Secondary | ICD-10-CM | POA: Diagnosis not present

## 2015-02-07 DIAGNOSIS — M6281 Muscle weakness (generalized): Secondary | ICD-10-CM | POA: Diagnosis not present

## 2015-02-07 DIAGNOSIS — C609 Malignant neoplasm of penis, unspecified: Secondary | ICD-10-CM | POA: Diagnosis not present

## 2015-02-07 DIAGNOSIS — R7889 Finding of other specified substances, not normally found in blood: Secondary | ICD-10-CM | POA: Diagnosis not present

## 2015-02-07 DIAGNOSIS — M21372 Foot drop, left foot: Secondary | ICD-10-CM | POA: Diagnosis not present

## 2015-02-07 DIAGNOSIS — R29898 Other symptoms and signs involving the musculoskeletal system: Secondary | ICD-10-CM | POA: Diagnosis not present

## 2015-02-07 DIAGNOSIS — N312 Flaccid neuropathic bladder, not elsewhere classified: Secondary | ICD-10-CM | POA: Diagnosis not present

## 2015-02-07 DIAGNOSIS — N319 Neuromuscular dysfunction of bladder, unspecified: Secondary | ICD-10-CM | POA: Diagnosis not present

## 2015-02-07 DIAGNOSIS — E559 Vitamin D deficiency, unspecified: Secondary | ICD-10-CM | POA: Diagnosis not present

## 2015-02-07 DIAGNOSIS — L0390 Cellulitis, unspecified: Secondary | ICD-10-CM | POA: Diagnosis not present

## 2015-02-07 DIAGNOSIS — I2510 Atherosclerotic heart disease of native coronary artery without angina pectoris: Secondary | ICD-10-CM | POA: Diagnosis not present

## 2015-02-08 DIAGNOSIS — I2510 Atherosclerotic heart disease of native coronary artery without angina pectoris: Secondary | ICD-10-CM | POA: Diagnosis not present

## 2015-02-08 DIAGNOSIS — R7889 Finding of other specified substances, not normally found in blood: Secondary | ICD-10-CM | POA: Diagnosis not present

## 2015-02-08 DIAGNOSIS — R599 Enlarged lymph nodes, unspecified: Secondary | ICD-10-CM | POA: Diagnosis not present

## 2015-02-08 DIAGNOSIS — C609 Malignant neoplasm of penis, unspecified: Secondary | ICD-10-CM | POA: Diagnosis not present

## 2015-02-08 DIAGNOSIS — E785 Hyperlipidemia, unspecified: Secondary | ICD-10-CM | POA: Diagnosis not present

## 2015-02-08 DIAGNOSIS — N312 Flaccid neuropathic bladder, not elsewhere classified: Secondary | ICD-10-CM | POA: Diagnosis not present

## 2015-02-08 DIAGNOSIS — N400 Enlarged prostate without lower urinary tract symptoms: Secondary | ICD-10-CM | POA: Diagnosis not present

## 2015-02-08 DIAGNOSIS — E559 Vitamin D deficiency, unspecified: Secondary | ICD-10-CM | POA: Diagnosis not present

## 2015-02-19 DIAGNOSIS — N400 Enlarged prostate without lower urinary tract symptoms: Secondary | ICD-10-CM | POA: Diagnosis not present

## 2015-02-19 DIAGNOSIS — I2510 Atherosclerotic heart disease of native coronary artery without angina pectoris: Secondary | ICD-10-CM | POA: Diagnosis not present

## 2015-02-19 DIAGNOSIS — E785 Hyperlipidemia, unspecified: Secondary | ICD-10-CM | POA: Diagnosis not present

## 2015-02-19 DIAGNOSIS — R599 Enlarged lymph nodes, unspecified: Secondary | ICD-10-CM | POA: Diagnosis not present

## 2015-02-19 DIAGNOSIS — N312 Flaccid neuropathic bladder, not elsewhere classified: Secondary | ICD-10-CM | POA: Diagnosis not present

## 2015-02-19 DIAGNOSIS — C609 Malignant neoplasm of penis, unspecified: Secondary | ICD-10-CM | POA: Diagnosis not present

## 2015-02-19 DIAGNOSIS — E559 Vitamin D deficiency, unspecified: Secondary | ICD-10-CM | POA: Diagnosis not present

## 2015-02-19 DIAGNOSIS — R7889 Finding of other specified substances, not normally found in blood: Secondary | ICD-10-CM | POA: Diagnosis not present

## 2015-02-20 DIAGNOSIS — R29898 Other symptoms and signs involving the musculoskeletal system: Secondary | ICD-10-CM | POA: Insufficient documentation

## 2015-02-20 DIAGNOSIS — C609 Malignant neoplasm of penis, unspecified: Secondary | ICD-10-CM | POA: Diagnosis not present

## 2015-02-22 DIAGNOSIS — C609 Malignant neoplasm of penis, unspecified: Secondary | ICD-10-CM | POA: Diagnosis not present

## 2015-02-22 DIAGNOSIS — R7889 Finding of other specified substances, not normally found in blood: Secondary | ICD-10-CM | POA: Diagnosis not present

## 2015-02-22 DIAGNOSIS — R599 Enlarged lymph nodes, unspecified: Secondary | ICD-10-CM | POA: Diagnosis not present

## 2015-02-22 DIAGNOSIS — N312 Flaccid neuropathic bladder, not elsewhere classified: Secondary | ICD-10-CM | POA: Diagnosis not present

## 2015-02-22 DIAGNOSIS — I2510 Atherosclerotic heart disease of native coronary artery without angina pectoris: Secondary | ICD-10-CM | POA: Diagnosis not present

## 2015-02-22 DIAGNOSIS — E785 Hyperlipidemia, unspecified: Secondary | ICD-10-CM | POA: Diagnosis not present

## 2015-02-22 DIAGNOSIS — E559 Vitamin D deficiency, unspecified: Secondary | ICD-10-CM | POA: Diagnosis not present

## 2015-02-22 DIAGNOSIS — N400 Enlarged prostate without lower urinary tract symptoms: Secondary | ICD-10-CM | POA: Diagnosis not present

## 2015-02-26 DIAGNOSIS — R2681 Unsteadiness on feet: Secondary | ICD-10-CM | POA: Diagnosis not present

## 2015-02-26 DIAGNOSIS — R599 Enlarged lymph nodes, unspecified: Secondary | ICD-10-CM | POA: Diagnosis not present

## 2015-02-26 DIAGNOSIS — N312 Flaccid neuropathic bladder, not elsewhere classified: Secondary | ICD-10-CM | POA: Diagnosis not present

## 2015-02-26 DIAGNOSIS — L0390 Cellulitis, unspecified: Secondary | ICD-10-CM | POA: Diagnosis not present

## 2015-02-26 DIAGNOSIS — N319 Neuromuscular dysfunction of bladder, unspecified: Secondary | ICD-10-CM | POA: Diagnosis not present

## 2015-02-26 DIAGNOSIS — I2510 Atherosclerotic heart disease of native coronary artery without angina pectoris: Secondary | ICD-10-CM | POA: Diagnosis not present

## 2015-02-26 DIAGNOSIS — E785 Hyperlipidemia, unspecified: Secondary | ICD-10-CM | POA: Diagnosis not present

## 2015-02-26 DIAGNOSIS — R7889 Finding of other specified substances, not normally found in blood: Secondary | ICD-10-CM | POA: Diagnosis not present

## 2015-02-26 DIAGNOSIS — E559 Vitamin D deficiency, unspecified: Secondary | ICD-10-CM | POA: Diagnosis not present

## 2015-02-26 DIAGNOSIS — R569 Unspecified convulsions: Secondary | ICD-10-CM | POA: Diagnosis not present

## 2015-02-26 DIAGNOSIS — C609 Malignant neoplasm of penis, unspecified: Secondary | ICD-10-CM | POA: Diagnosis not present

## 2015-02-26 DIAGNOSIS — N400 Enlarged prostate without lower urinary tract symptoms: Secondary | ICD-10-CM | POA: Diagnosis not present

## 2015-02-26 DIAGNOSIS — M6281 Muscle weakness (generalized): Secondary | ICD-10-CM | POA: Diagnosis not present

## 2015-02-26 DIAGNOSIS — M21372 Foot drop, left foot: Secondary | ICD-10-CM | POA: Diagnosis not present

## 2015-02-28 DIAGNOSIS — R7889 Finding of other specified substances, not normally found in blood: Secondary | ICD-10-CM | POA: Diagnosis not present

## 2015-02-28 DIAGNOSIS — N312 Flaccid neuropathic bladder, not elsewhere classified: Secondary | ICD-10-CM | POA: Diagnosis not present

## 2015-02-28 DIAGNOSIS — C609 Malignant neoplasm of penis, unspecified: Secondary | ICD-10-CM | POA: Diagnosis not present

## 2015-03-02 DIAGNOSIS — R599 Enlarged lymph nodes, unspecified: Secondary | ICD-10-CM | POA: Diagnosis not present

## 2015-03-02 DIAGNOSIS — E785 Hyperlipidemia, unspecified: Secondary | ICD-10-CM | POA: Diagnosis not present

## 2015-03-02 DIAGNOSIS — E559 Vitamin D deficiency, unspecified: Secondary | ICD-10-CM | POA: Diagnosis not present

## 2015-03-02 DIAGNOSIS — L0390 Cellulitis, unspecified: Secondary | ICD-10-CM | POA: Diagnosis not present

## 2015-03-04 DIAGNOSIS — R569 Unspecified convulsions: Secondary | ICD-10-CM | POA: Diagnosis not present

## 2015-03-04 DIAGNOSIS — L0390 Cellulitis, unspecified: Secondary | ICD-10-CM | POA: Diagnosis not present

## 2015-03-04 DIAGNOSIS — C609 Malignant neoplasm of penis, unspecified: Secondary | ICD-10-CM | POA: Diagnosis not present

## 2015-03-05 DIAGNOSIS — L0390 Cellulitis, unspecified: Secondary | ICD-10-CM | POA: Diagnosis not present

## 2015-03-05 DIAGNOSIS — E559 Vitamin D deficiency, unspecified: Secondary | ICD-10-CM | POA: Diagnosis not present

## 2015-03-05 DIAGNOSIS — R599 Enlarged lymph nodes, unspecified: Secondary | ICD-10-CM | POA: Diagnosis not present

## 2015-03-05 DIAGNOSIS — C609 Malignant neoplasm of penis, unspecified: Secondary | ICD-10-CM | POA: Diagnosis not present

## 2015-03-05 DIAGNOSIS — E785 Hyperlipidemia, unspecified: Secondary | ICD-10-CM | POA: Diagnosis not present

## 2015-03-12 DIAGNOSIS — N400 Enlarged prostate without lower urinary tract symptoms: Secondary | ICD-10-CM | POA: Diagnosis not present

## 2015-03-12 DIAGNOSIS — R590 Localized enlarged lymph nodes: Secondary | ICD-10-CM | POA: Diagnosis not present

## 2015-03-12 DIAGNOSIS — N312 Flaccid neuropathic bladder, not elsewhere classified: Secondary | ICD-10-CM | POA: Diagnosis not present

## 2015-03-12 DIAGNOSIS — M6281 Muscle weakness (generalized): Secondary | ICD-10-CM | POA: Diagnosis not present

## 2015-03-12 DIAGNOSIS — I2510 Atherosclerotic heart disease of native coronary artery without angina pectoris: Secondary | ICD-10-CM | POA: Diagnosis not present

## 2015-03-12 DIAGNOSIS — C609 Malignant neoplasm of penis, unspecified: Secondary | ICD-10-CM | POA: Diagnosis not present

## 2015-03-12 DIAGNOSIS — Z483 Aftercare following surgery for neoplasm: Secondary | ICD-10-CM | POA: Diagnosis not present

## 2015-03-12 DIAGNOSIS — M21372 Foot drop, left foot: Secondary | ICD-10-CM | POA: Diagnosis not present

## 2015-03-12 DIAGNOSIS — E785 Hyperlipidemia, unspecified: Secondary | ICD-10-CM | POA: Diagnosis not present

## 2015-03-13 ENCOUNTER — Ambulatory Visit (HOSPITAL_COMMUNITY)
Admission: RE | Admit: 2015-03-13 | Discharge: 2015-03-13 | Disposition: A | Discharge status: Home / Self Care | Payer: Medicare Other | Source: Ambulatory Visit | Attending: Family Medicine | Admitting: Family Medicine

## 2015-03-13 ENCOUNTER — Other Ambulatory Visit (HOSPITAL_COMMUNITY): Payer: Self-pay | Admitting: Family Medicine

## 2015-03-13 DIAGNOSIS — M7989 Other specified soft tissue disorders: Secondary | ICD-10-CM

## 2015-03-13 DIAGNOSIS — R600 Localized edema: Secondary | ICD-10-CM | POA: Diagnosis not present

## 2015-03-13 DIAGNOSIS — E6609 Other obesity due to excess calories: Secondary | ICD-10-CM | POA: Diagnosis not present

## 2015-03-13 DIAGNOSIS — Z6831 Body mass index (BMI) 31.0-31.9, adult: Secondary | ICD-10-CM | POA: Diagnosis not present

## 2015-03-14 DIAGNOSIS — N312 Flaccid neuropathic bladder, not elsewhere classified: Secondary | ICD-10-CM | POA: Diagnosis not present

## 2015-03-14 DIAGNOSIS — N400 Enlarged prostate without lower urinary tract symptoms: Secondary | ICD-10-CM | POA: Diagnosis not present

## 2015-03-14 DIAGNOSIS — M6281 Muscle weakness (generalized): Secondary | ICD-10-CM | POA: Diagnosis not present

## 2015-03-14 DIAGNOSIS — C609 Malignant neoplasm of penis, unspecified: Secondary | ICD-10-CM | POA: Diagnosis not present

## 2015-03-14 DIAGNOSIS — M21372 Foot drop, left foot: Secondary | ICD-10-CM | POA: Diagnosis not present

## 2015-03-14 DIAGNOSIS — Z483 Aftercare following surgery for neoplasm: Secondary | ICD-10-CM | POA: Diagnosis not present

## 2015-03-19 DIAGNOSIS — Z483 Aftercare following surgery for neoplasm: Secondary | ICD-10-CM | POA: Diagnosis not present

## 2015-03-19 DIAGNOSIS — C609 Malignant neoplasm of penis, unspecified: Secondary | ICD-10-CM | POA: Diagnosis not present

## 2015-03-19 DIAGNOSIS — N312 Flaccid neuropathic bladder, not elsewhere classified: Secondary | ICD-10-CM | POA: Diagnosis not present

## 2015-03-19 DIAGNOSIS — M6281 Muscle weakness (generalized): Secondary | ICD-10-CM | POA: Diagnosis not present

## 2015-03-19 DIAGNOSIS — M21372 Foot drop, left foot: Secondary | ICD-10-CM | POA: Diagnosis not present

## 2015-03-19 DIAGNOSIS — N400 Enlarged prostate without lower urinary tract symptoms: Secondary | ICD-10-CM | POA: Diagnosis not present

## 2015-03-21 ENCOUNTER — Telehealth: Payer: Self-pay | Admitting: Cardiovascular Disease

## 2015-03-21 ENCOUNTER — Encounter (HOSPITAL_COMMUNITY): Payer: Self-pay | Admitting: Emergency Medicine

## 2015-03-21 ENCOUNTER — Emergency Department (HOSPITAL_COMMUNITY)
Admission: EM | Admit: 2015-03-21 | Discharge: 2015-03-21 | Disposition: A | Discharge status: Home / Self Care | Payer: Medicare Other | Attending: Emergency Medicine | Admitting: Emergency Medicine

## 2015-03-21 DIAGNOSIS — Z7982 Long term (current) use of aspirin: Secondary | ICD-10-CM | POA: Insufficient documentation

## 2015-03-21 DIAGNOSIS — Z79899 Other long term (current) drug therapy: Secondary | ICD-10-CM | POA: Diagnosis not present

## 2015-03-21 DIAGNOSIS — I83018 Varicose veins of right lower extremity with ulcer other part of lower leg: Secondary | ICD-10-CM | POA: Insufficient documentation

## 2015-03-21 DIAGNOSIS — N312 Flaccid neuropathic bladder, not elsewhere classified: Secondary | ICD-10-CM | POA: Diagnosis not present

## 2015-03-21 DIAGNOSIS — Z87891 Personal history of nicotine dependence: Secondary | ICD-10-CM | POA: Insufficient documentation

## 2015-03-21 DIAGNOSIS — C609 Malignant neoplasm of penis, unspecified: Secondary | ICD-10-CM | POA: Diagnosis not present

## 2015-03-21 DIAGNOSIS — E669 Obesity, unspecified: Secondary | ICD-10-CM | POA: Diagnosis not present

## 2015-03-21 DIAGNOSIS — L97819 Non-pressure chronic ulcer of other part of right lower leg with unspecified severity: Secondary | ICD-10-CM | POA: Insufficient documentation

## 2015-03-21 DIAGNOSIS — R2241 Localized swelling, mass and lump, right lower limb: Secondary | ICD-10-CM | POA: Diagnosis present

## 2015-03-21 DIAGNOSIS — M6281 Muscle weakness (generalized): Secondary | ICD-10-CM | POA: Diagnosis not present

## 2015-03-21 DIAGNOSIS — E785 Hyperlipidemia, unspecified: Secondary | ICD-10-CM | POA: Insufficient documentation

## 2015-03-21 DIAGNOSIS — Z7901 Long term (current) use of anticoagulants: Secondary | ICD-10-CM | POA: Diagnosis not present

## 2015-03-21 DIAGNOSIS — IMO0001 Reserved for inherently not codable concepts without codable children: Secondary | ICD-10-CM

## 2015-03-21 DIAGNOSIS — I2510 Atherosclerotic heart disease of native coronary artery without angina pectoris: Secondary | ICD-10-CM | POA: Diagnosis not present

## 2015-03-21 DIAGNOSIS — M21372 Foot drop, left foot: Secondary | ICD-10-CM | POA: Diagnosis not present

## 2015-03-21 DIAGNOSIS — I83008 Varicose veins of unspecified lower extremity with ulcer other part of lower leg: Secondary | ICD-10-CM | POA: Diagnosis not present

## 2015-03-21 DIAGNOSIS — Z483 Aftercare following surgery for neoplasm: Secondary | ICD-10-CM | POA: Diagnosis not present

## 2015-03-21 DIAGNOSIS — N400 Enlarged prostate without lower urinary tract symptoms: Secondary | ICD-10-CM | POA: Diagnosis not present

## 2015-03-21 MED ORDER — SULFAMETHOXAZOLE-TRIMETHOPRIM 800-160 MG PO TABS
1.0000 | ORAL_TABLET | Freq: Once | ORAL | Status: AC
  Administered 2015-03-21: 1 via ORAL
  Filled 2015-03-21: qty 1

## 2015-03-21 MED ORDER — SULFAMETHOXAZOLE-TRIMETHOPRIM 800-160 MG PO TABS: 1.0000 | ORAL_TABLET | Freq: Two times a day (BID) | ORAL | Status: DC

## 2015-03-21 NOTE — ED Notes (Signed)
Presented to ER with C/O of right leg swelling denies pain. States that it has been swollen  since surgery in feb 2016 but not red or oozing. Right leg red and brown from knee down 2+ pedal pulse warm to touch. Patient states that he has not followed up with MD at [facility] since the leg has been discolored. Patient lives alone and uses a Glosser. Will contact case manager

## 2015-03-21 NOTE — ED Provider Notes (Signed)
CSN: [id number]     Arrival date & time 03/21/15  1542 History  This chart was scribed for Nat Christen, MD by Delphia Grates, ED Scribe. This patient was seen in room APA08/APA08 and the patient's care was started at 4:17 PM.   Chief Complaint  Patient presents with  . Leg Swelling     The history is provided by the patient. No language interpreter was used.     HPI Comments: Caleb [name] is a 81 y.o. male who presents to the Emergency Department complaining of gradually worsening, RLE swelling for the past month. Patient reports history of penile cancer and states he had a penectomy at was seen seen at [facility] on February 04, 2015. Patient reports RLE swelling since this time that has become worse with associated discoloration/redness, blistering, and drainage in the last 2 days. Patient was seen by his PCP (Dr. Hilma Favors) for this and was sent to the ED to be further evaluated. Patient resides alone.   Past Medical History  Diagnosis Date  . Hyperlipidemia   . Coronary artery disease   . Obesity   . Hyperlipidemia   . Swelling of extremity     Left Leg   Past Surgical History  Procedure Laterality Date  . Appendectomy    . Cholecystectomy    . Coronary artery bypass graft    . Hiatal hernia repair    . Back surgery    . Lymph node removal     Family History  Problem Relation Age of Onset  . Cancer    . Cancer Father   . Cancer Sister   . Cancer Brother    History  Substance Use Topics  . Smoking status: Former Smoker    Types: Cigarettes    Quit date: 07/14/1983  . Smokeless tobacco: Never Used  . Alcohol Use: No    Review of Systems  A complete 10 system review of systems was obtained and all systems are negative except as noted in the HPI and PMH.    Allergies  Review of patient's allergies indicates no known allergies.  Home Medications   Prior to Admission medications   Medication Sig Start Date End Date Taking? Authorizing Provider  Ascorbic  Acid (VITAMIN C) 1000 MG tablet Take 1,000 mg by mouth daily.    Yes Historical Provider, MD  aspirin 81 MG tablet Take 81 mg by mouth daily.     Yes Historical Provider, MD  Cholecalciferol (VITAMIN D3) 400 UNITS CAPS Take 1 capsule by mouth daily.    Yes Historical Provider, MD  clopidogrel (PLAVIX) 75 MG tablet Take 75 mg by mouth daily.   Yes Historical Provider, MD  metoprolol tartrate (LOPRESSOR) 25 MG tablet Take 25 mg by mouth 2 (two) times daily.   Yes Historical Provider, MD  Multiple Vitamins-Minerals (CENTRUM SILVER ADULT 50+ PO) Take 1 tablet by mouth daily.    Yes Historical Provider, MD  simvastatin (ZOCOR) 20 MG tablet Take 1 tablet (20 mg total) by mouth daily at 6 PM. 06/12/14  Yes Lorretta Harp, MD  sulfamethoxazole-trimethoprim (SEPTRA DS) 800-160 MG per tablet Take 1 tablet by mouth 2 (two) times daily. 03/21/15   Nat Christen, MD   BP 141/72 mmHg  Pulse 72  Temp(Src) 97.5 F (36.4 C) (Oral)  Resp 20  SpO2 99% Physical Exam  Constitutional: He is oriented to person, place, and time. He appears well-developed and well-nourished.  Obese  HENT:  Head: Normocephalic and atraumatic.  Eyes:  Conjunctivae and EOM are normal. Pupils are equal, round, and reactive to light.  Neck: Normal range of motion. Neck supple.  Cardiovascular: Normal rate and regular rhythm.   Pulmonary/Chest: Effort normal and breath sounds normal.  Abdominal: Soft. Bowel sounds are normal.  Genitourinary:  No penis; scrotum, testicles, and urethral opening appear normal.  Musculoskeletal: Normal range of motion. He exhibits edema.  RLE is edematous.  Neurological: He is alert and oriented to person, place, and time.  Skin: Skin is warm and dry.  RLE reddish brownish discoloration from the mid tibia area circumferentially to the ankle. There is some blistering and leakage of fluid.  Psychiatric: He has a normal mood and affect. His behavior is normal.  Nursing note and vitals reviewed.   ED  Course  Procedures (including critical care time)  DIAGNOSTIC STUDIES: Oxygen Saturation is 99% which is normal  COORDINATION OF CARE: At 1623 Discussed treatment plan with patient which includes consult to wound care. Patient agrees.   Labs Review Labs Reviewed - No data to display  Imaging Review No results found.   EKG Interpretation None      MDM   Final diagnoses:  Venous stasis ulcer, right   Patient has a venous stasis ulcer on his right lower extremity. Recent Doppler study was negative for DVT. I attempted to call the physical therapy department of [facility] at [phone] for a wound care consultation. Home nurse will visit tomorrow morning to help patient with this appt.. Also discussed with his son Yaphet Smethurst at [phone]   I personally performed the services described in this documentation, which was scribed in my presence. The recorded information has been reviewed and is accurate.    Nat Christen, MD 03/21/15 [phone]

## 2015-03-21 NOTE — Discharge Instructions (Signed)
Call the [facility] physical therapy department at [phone].    You will need wound care for your right lower extremity. Antibiotic twice a days. Elevate your legs. I discussed this with your son.

## 2015-03-21 NOTE — ED Notes (Signed)
PT states he had lymph nodes removed from right groin at [facility] on 02/04/15 and has had RLE swelling since then but about 2 days ago with draining blisters and pitting edema.

## 2015-03-21 NOTE — ED Notes (Signed)
discharged to home patient uses [facility] spoke with son re: follow up. Patient verbalized understanding.

## 2015-03-21 NOTE — Telephone Encounter (Signed)
Closed encounter °

## 2015-03-22 DIAGNOSIS — N400 Enlarged prostate without lower urinary tract symptoms: Secondary | ICD-10-CM | POA: Diagnosis not present

## 2015-03-22 DIAGNOSIS — M21372 Foot drop, left foot: Secondary | ICD-10-CM | POA: Diagnosis not present

## 2015-03-22 DIAGNOSIS — M6281 Muscle weakness (generalized): Secondary | ICD-10-CM | POA: Diagnosis not present

## 2015-03-22 DIAGNOSIS — C609 Malignant neoplasm of penis, unspecified: Secondary | ICD-10-CM | POA: Diagnosis not present

## 2015-03-22 DIAGNOSIS — Z483 Aftercare following surgery for neoplasm: Secondary | ICD-10-CM | POA: Diagnosis not present

## 2015-03-22 DIAGNOSIS — N312 Flaccid neuropathic bladder, not elsewhere classified: Secondary | ICD-10-CM | POA: Diagnosis not present

## 2015-03-26 DIAGNOSIS — M21372 Foot drop, left foot: Secondary | ICD-10-CM | POA: Diagnosis not present

## 2015-03-26 DIAGNOSIS — C609 Malignant neoplasm of penis, unspecified: Secondary | ICD-10-CM | POA: Diagnosis not present

## 2015-03-26 DIAGNOSIS — N312 Flaccid neuropathic bladder, not elsewhere classified: Secondary | ICD-10-CM | POA: Diagnosis not present

## 2015-03-26 DIAGNOSIS — Z483 Aftercare following surgery for neoplasm: Secondary | ICD-10-CM | POA: Diagnosis not present

## 2015-03-26 DIAGNOSIS — M6281 Muscle weakness (generalized): Secondary | ICD-10-CM | POA: Diagnosis not present

## 2015-03-26 DIAGNOSIS — N400 Enlarged prostate without lower urinary tract symptoms: Secondary | ICD-10-CM | POA: Diagnosis not present

## 2015-03-28 DIAGNOSIS — N312 Flaccid neuropathic bladder, not elsewhere classified: Secondary | ICD-10-CM | POA: Diagnosis not present

## 2015-03-28 DIAGNOSIS — M6281 Muscle weakness (generalized): Secondary | ICD-10-CM | POA: Diagnosis not present

## 2015-03-28 DIAGNOSIS — N400 Enlarged prostate without lower urinary tract symptoms: Secondary | ICD-10-CM | POA: Diagnosis not present

## 2015-03-28 DIAGNOSIS — C609 Malignant neoplasm of penis, unspecified: Secondary | ICD-10-CM | POA: Diagnosis not present

## 2015-03-28 DIAGNOSIS — Z483 Aftercare following surgery for neoplasm: Secondary | ICD-10-CM | POA: Diagnosis not present

## 2015-03-28 DIAGNOSIS — M21372 Foot drop, left foot: Secondary | ICD-10-CM | POA: Diagnosis not present

## 2015-04-02 DIAGNOSIS — M6281 Muscle weakness (generalized): Secondary | ICD-10-CM | POA: Diagnosis not present

## 2015-04-02 DIAGNOSIS — Z483 Aftercare following surgery for neoplasm: Secondary | ICD-10-CM | POA: Diagnosis not present

## 2015-04-02 DIAGNOSIS — C609 Malignant neoplasm of penis, unspecified: Secondary | ICD-10-CM | POA: Diagnosis not present

## 2015-04-02 DIAGNOSIS — N312 Flaccid neuropathic bladder, not elsewhere classified: Secondary | ICD-10-CM | POA: Diagnosis not present

## 2015-04-02 DIAGNOSIS — N400 Enlarged prostate without lower urinary tract symptoms: Secondary | ICD-10-CM | POA: Diagnosis not present

## 2015-04-02 DIAGNOSIS — M21372 Foot drop, left foot: Secondary | ICD-10-CM | POA: Diagnosis not present

## 2015-04-04 DIAGNOSIS — N400 Enlarged prostate without lower urinary tract symptoms: Secondary | ICD-10-CM | POA: Diagnosis not present

## 2015-04-04 DIAGNOSIS — M6281 Muscle weakness (generalized): Secondary | ICD-10-CM | POA: Diagnosis not present

## 2015-04-04 DIAGNOSIS — M21372 Foot drop, left foot: Secondary | ICD-10-CM | POA: Diagnosis not present

## 2015-04-04 DIAGNOSIS — C609 Malignant neoplasm of penis, unspecified: Secondary | ICD-10-CM | POA: Diagnosis not present

## 2015-04-04 DIAGNOSIS — Z483 Aftercare following surgery for neoplasm: Secondary | ICD-10-CM | POA: Diagnosis not present

## 2015-04-04 DIAGNOSIS — N390 Urinary tract infection, site not specified: Secondary | ICD-10-CM | POA: Diagnosis not present

## 2015-04-04 DIAGNOSIS — N312 Flaccid neuropathic bladder, not elsewhere classified: Secondary | ICD-10-CM | POA: Diagnosis not present

## 2015-04-09 DIAGNOSIS — C609 Malignant neoplasm of penis, unspecified: Secondary | ICD-10-CM | POA: Diagnosis not present

## 2015-04-09 DIAGNOSIS — M6281 Muscle weakness (generalized): Secondary | ICD-10-CM | POA: Diagnosis not present

## 2015-04-09 DIAGNOSIS — Z483 Aftercare following surgery for neoplasm: Secondary | ICD-10-CM | POA: Diagnosis not present

## 2015-04-09 DIAGNOSIS — N312 Flaccid neuropathic bladder, not elsewhere classified: Secondary | ICD-10-CM | POA: Diagnosis not present

## 2015-04-09 DIAGNOSIS — M21372 Foot drop, left foot: Secondary | ICD-10-CM | POA: Diagnosis not present

## 2015-04-09 DIAGNOSIS — N400 Enlarged prostate without lower urinary tract symptoms: Secondary | ICD-10-CM | POA: Diagnosis not present

## 2015-04-12 DIAGNOSIS — C609 Malignant neoplasm of penis, unspecified: Secondary | ICD-10-CM | POA: Diagnosis not present

## 2015-04-12 DIAGNOSIS — Z483 Aftercare following surgery for neoplasm: Secondary | ICD-10-CM | POA: Diagnosis not present

## 2015-04-12 DIAGNOSIS — M6281 Muscle weakness (generalized): Secondary | ICD-10-CM | POA: Diagnosis not present

## 2015-04-12 DIAGNOSIS — N312 Flaccid neuropathic bladder, not elsewhere classified: Secondary | ICD-10-CM | POA: Diagnosis not present

## 2015-04-12 DIAGNOSIS — N400 Enlarged prostate without lower urinary tract symptoms: Secondary | ICD-10-CM | POA: Diagnosis not present

## 2015-04-12 DIAGNOSIS — M21372 Foot drop, left foot: Secondary | ICD-10-CM | POA: Diagnosis not present

## 2015-04-17 DIAGNOSIS — N400 Enlarged prostate without lower urinary tract symptoms: Secondary | ICD-10-CM | POA: Diagnosis not present

## 2015-04-17 DIAGNOSIS — C609 Malignant neoplasm of penis, unspecified: Secondary | ICD-10-CM | POA: Diagnosis not present

## 2015-04-17 DIAGNOSIS — M21372 Foot drop, left foot: Secondary | ICD-10-CM | POA: Diagnosis not present

## 2015-04-17 DIAGNOSIS — M6281 Muscle weakness (generalized): Secondary | ICD-10-CM | POA: Diagnosis not present

## 2015-04-17 DIAGNOSIS — N312 Flaccid neuropathic bladder, not elsewhere classified: Secondary | ICD-10-CM | POA: Diagnosis not present

## 2015-04-17 DIAGNOSIS — Z483 Aftercare following surgery for neoplasm: Secondary | ICD-10-CM | POA: Diagnosis not present

## 2015-04-18 DIAGNOSIS — Z8546 Personal history of malignant neoplasm of prostate: Secondary | ICD-10-CM | POA: Diagnosis not present

## 2015-04-18 DIAGNOSIS — E6609 Other obesity due to excess calories: Secondary | ICD-10-CM | POA: Diagnosis not present

## 2015-04-18 DIAGNOSIS — Z6832 Body mass index (BMI) 32.0-32.9, adult: Secondary | ICD-10-CM | POA: Diagnosis not present

## 2015-04-18 DIAGNOSIS — L7622 Postprocedural hemorrhage and hematoma of skin and subcutaneous tissue following other procedure: Secondary | ICD-10-CM | POA: Diagnosis not present

## 2015-04-18 DIAGNOSIS — I890 Lymphedema, not elsewhere classified: Secondary | ICD-10-CM | POA: Diagnosis not present

## 2015-04-18 DIAGNOSIS — C609 Malignant neoplasm of penis, unspecified: Secondary | ICD-10-CM | POA: Diagnosis not present

## 2015-04-23 DIAGNOSIS — E6609 Other obesity due to excess calories: Secondary | ICD-10-CM | POA: Diagnosis not present

## 2015-04-23 DIAGNOSIS — Z6832 Body mass index (BMI) 32.0-32.9, adult: Secondary | ICD-10-CM | POA: Diagnosis not present

## 2015-04-23 DIAGNOSIS — I890 Lymphedema, not elsewhere classified: Secondary | ICD-10-CM | POA: Diagnosis not present

## 2015-04-23 DIAGNOSIS — M5432 Sciatica, left side: Secondary | ICD-10-CM | POA: Diagnosis not present

## 2015-04-24 DIAGNOSIS — C609 Malignant neoplasm of penis, unspecified: Secondary | ICD-10-CM | POA: Diagnosis not present

## 2015-04-24 DIAGNOSIS — M21372 Foot drop, left foot: Secondary | ICD-10-CM | POA: Diagnosis not present

## 2015-04-24 DIAGNOSIS — N400 Enlarged prostate without lower urinary tract symptoms: Secondary | ICD-10-CM | POA: Diagnosis not present

## 2015-04-24 DIAGNOSIS — Z483 Aftercare following surgery for neoplasm: Secondary | ICD-10-CM | POA: Diagnosis not present

## 2015-04-24 DIAGNOSIS — M6281 Muscle weakness (generalized): Secondary | ICD-10-CM | POA: Diagnosis not present

## 2015-04-24 DIAGNOSIS — N312 Flaccid neuropathic bladder, not elsewhere classified: Secondary | ICD-10-CM | POA: Diagnosis not present

## 2015-05-01 ENCOUNTER — Other Ambulatory Visit: Payer: Self-pay | Admitting: *Deleted

## 2015-05-01 DIAGNOSIS — N400 Enlarged prostate without lower urinary tract symptoms: Secondary | ICD-10-CM | POA: Diagnosis not present

## 2015-05-01 DIAGNOSIS — M6281 Muscle weakness (generalized): Secondary | ICD-10-CM | POA: Diagnosis not present

## 2015-05-01 DIAGNOSIS — I83893 Varicose veins of bilateral lower extremities with other complications: Secondary | ICD-10-CM

## 2015-05-01 DIAGNOSIS — M21372 Foot drop, left foot: Secondary | ICD-10-CM | POA: Diagnosis not present

## 2015-05-01 DIAGNOSIS — C609 Malignant neoplasm of penis, unspecified: Secondary | ICD-10-CM | POA: Diagnosis not present

## 2015-05-01 DIAGNOSIS — Z483 Aftercare following surgery for neoplasm: Secondary | ICD-10-CM | POA: Diagnosis not present

## 2015-05-01 DIAGNOSIS — N312 Flaccid neuropathic bladder, not elsewhere classified: Secondary | ICD-10-CM | POA: Diagnosis not present

## 2015-05-08 ENCOUNTER — Other Ambulatory Visit (HOSPITAL_COMMUNITY): Payer: Self-pay | Admitting: Family Medicine

## 2015-05-08 DIAGNOSIS — M6281 Muscle weakness (generalized): Secondary | ICD-10-CM | POA: Diagnosis not present

## 2015-05-08 DIAGNOSIS — N312 Flaccid neuropathic bladder, not elsewhere classified: Secondary | ICD-10-CM | POA: Diagnosis not present

## 2015-05-08 DIAGNOSIS — M21372 Foot drop, left foot: Secondary | ICD-10-CM | POA: Diagnosis not present

## 2015-05-08 DIAGNOSIS — C609 Malignant neoplasm of penis, unspecified: Secondary | ICD-10-CM | POA: Diagnosis not present

## 2015-05-08 DIAGNOSIS — M25552 Pain in left hip: Secondary | ICD-10-CM

## 2015-05-08 DIAGNOSIS — Z483 Aftercare following surgery for neoplasm: Secondary | ICD-10-CM | POA: Diagnosis not present

## 2015-05-08 DIAGNOSIS — N400 Enlarged prostate without lower urinary tract symptoms: Secondary | ICD-10-CM | POA: Diagnosis not present

## 2015-05-09 ENCOUNTER — Other Ambulatory Visit (HOSPITAL_COMMUNITY): Payer: Self-pay | Admitting: Family Medicine

## 2015-05-09 ENCOUNTER — Ambulatory Visit (HOSPITAL_COMMUNITY)
Admission: RE | Admit: 2015-05-09 | Discharge: 2015-05-09 | Disposition: A | Discharge status: Home / Self Care | Payer: Medicare Other | Source: Ambulatory Visit | Attending: Family Medicine | Admitting: Family Medicine

## 2015-05-09 DIAGNOSIS — M25552 Pain in left hip: Secondary | ICD-10-CM | POA: Insufficient documentation

## 2015-05-09 DIAGNOSIS — M1612 Unilateral primary osteoarthritis, left hip: Secondary | ICD-10-CM | POA: Diagnosis not present

## 2015-05-11 DIAGNOSIS — N400 Enlarged prostate without lower urinary tract symptoms: Secondary | ICD-10-CM | POA: Diagnosis not present

## 2015-05-11 DIAGNOSIS — M21372 Foot drop, left foot: Secondary | ICD-10-CM | POA: Diagnosis not present

## 2015-05-11 DIAGNOSIS — N312 Flaccid neuropathic bladder, not elsewhere classified: Secondary | ICD-10-CM | POA: Diagnosis not present

## 2015-05-11 DIAGNOSIS — R590 Localized enlarged lymph nodes: Secondary | ICD-10-CM | POA: Diagnosis not present

## 2015-05-11 DIAGNOSIS — Z483 Aftercare following surgery for neoplasm: Secondary | ICD-10-CM | POA: Diagnosis not present

## 2015-05-11 DIAGNOSIS — Z466 Encounter for fitting and adjustment of urinary device: Secondary | ICD-10-CM | POA: Diagnosis not present

## 2015-05-11 DIAGNOSIS — I2510 Atherosclerotic heart disease of native coronary artery without angina pectoris: Secondary | ICD-10-CM | POA: Diagnosis not present

## 2015-05-11 DIAGNOSIS — Z87440 Personal history of urinary (tract) infections: Secondary | ICD-10-CM | POA: Diagnosis not present

## 2015-05-11 DIAGNOSIS — C609 Malignant neoplasm of penis, unspecified: Secondary | ICD-10-CM | POA: Diagnosis not present

## 2015-05-16 DIAGNOSIS — N400 Enlarged prostate without lower urinary tract symptoms: Secondary | ICD-10-CM | POA: Diagnosis not present

## 2015-05-16 DIAGNOSIS — M21372 Foot drop, left foot: Secondary | ICD-10-CM | POA: Diagnosis not present

## 2015-05-16 DIAGNOSIS — I2510 Atherosclerotic heart disease of native coronary artery without angina pectoris: Secondary | ICD-10-CM | POA: Diagnosis not present

## 2015-05-16 DIAGNOSIS — C609 Malignant neoplasm of penis, unspecified: Secondary | ICD-10-CM | POA: Diagnosis not present

## 2015-05-16 DIAGNOSIS — N312 Flaccid neuropathic bladder, not elsewhere classified: Secondary | ICD-10-CM | POA: Diagnosis not present

## 2015-05-16 DIAGNOSIS — Z483 Aftercare following surgery for neoplasm: Secondary | ICD-10-CM | POA: Diagnosis not present

## 2015-05-22 ENCOUNTER — Encounter: Payer: Self-pay | Admitting: Vascular Surgery

## 2015-05-22 DIAGNOSIS — M21372 Foot drop, left foot: Secondary | ICD-10-CM | POA: Diagnosis not present

## 2015-05-22 DIAGNOSIS — N400 Enlarged prostate without lower urinary tract symptoms: Secondary | ICD-10-CM | POA: Diagnosis not present

## 2015-05-22 DIAGNOSIS — C609 Malignant neoplasm of penis, unspecified: Secondary | ICD-10-CM | POA: Diagnosis not present

## 2015-05-22 DIAGNOSIS — N312 Flaccid neuropathic bladder, not elsewhere classified: Secondary | ICD-10-CM | POA: Diagnosis not present

## 2015-05-22 DIAGNOSIS — Z483 Aftercare following surgery for neoplasm: Secondary | ICD-10-CM | POA: Diagnosis not present

## 2015-05-22 DIAGNOSIS — I2510 Atherosclerotic heart disease of native coronary artery without angina pectoris: Secondary | ICD-10-CM | POA: Diagnosis not present

## 2015-05-23 ENCOUNTER — Encounter: Payer: Self-pay | Admitting: Vascular Surgery

## 2015-05-23 ENCOUNTER — Ambulatory Visit (INDEPENDENT_AMBULATORY_CARE_PROVIDER_SITE_OTHER): Payer: Medicare Other | Admitting: Vascular Surgery

## 2015-05-23 ENCOUNTER — Ambulatory Visit (HOSPITAL_COMMUNITY)
Admission: RE | Admit: 2015-05-23 | Discharge: 2015-05-23 | Disposition: A | Discharge status: Home / Self Care | Payer: Medicare Other | Source: Ambulatory Visit | Attending: Vascular Surgery | Admitting: Vascular Surgery

## 2015-05-23 VITALS — BP 148/84 | HR 75 | Ht 72.0 in | Wt 246.0 lb

## 2015-05-23 DIAGNOSIS — R1909 Other intra-abdominal and pelvic swelling, mass and lump: Secondary | ICD-10-CM | POA: Diagnosis not present

## 2015-05-23 DIAGNOSIS — Z8549 Personal history of malignant neoplasm of other male genital organs: Secondary | ICD-10-CM | POA: Diagnosis not present

## 2015-05-23 DIAGNOSIS — I83893 Varicose veins of bilateral lower extremities with other complications: Secondary | ICD-10-CM | POA: Diagnosis not present

## 2015-05-23 DIAGNOSIS — M7989 Other specified soft tissue disorders: Secondary | ICD-10-CM | POA: Diagnosis not present

## 2015-05-23 NOTE — Progress Notes (Signed)
HISTORY AND PHYSICAL     CC:  Leg swelling right leg Referring Provider:  Redmond School, MD  HPI: This is a 81 y.o. male who has seen Dr. Oneida Alar in the past for venous insufficiency.  He returns today b/c his right leg is swelling more and he has had more swelling in his thigh and groin.  He states that earlier this year, he was diagnosed with penile cancer and underwent a penectomy in February as well as lymph node dissection of both groins.  He did have drains in postoperatively.  He did not have any further treatment for his cancer such as radiation or chemotherapy.  He has not followed up and he is not scheduled for any follow up.      He states that the swelling in his groin and thigh have started over the past two weeks and has actually gotten a little bit better.  He did have significant swelling in his right knee, however, this has also improved.  He does wear compression stockings.  He does have a hx of laser ablation at [place] approximately 6 years ago.  He has never had a DVT.    He has hx of cardiac stent and is on Plavix.    Past Medical History  Diagnosis Date  . Hyperlipidemia   . Coronary artery disease   . Obesity   . Hyperlipidemia   . Swelling of extremity     Left Leg    Past Surgical History  Procedure Laterality Date  . Appendectomy    . Cholecystectomy    . Coronary artery bypass graft    . Hiatal hernia repair    . Back surgery    . Lymph node removal      No Known Allergies  Current Outpatient Prescriptions  Medication Sig Dispense Refill  . Ascorbic Acid (VITAMIN C) 1000 MG tablet Take 1,000 mg by mouth daily.     Marland Kitchen aspirin 81 MG tablet Take 81 mg by mouth daily.      . Cholecalciferol (VITAMIN D3) 400 UNITS CAPS Take 1 capsule by mouth daily.     . clopidogrel (PLAVIX) 75 MG tablet Take 75 mg by mouth daily.    . metoprolol tartrate (LOPRESSOR) 25 MG tablet Take 25 mg by mouth 2 (two) times daily.    . Multiple Vitamins-Minerals (CENTRUM  SILVER ADULT 50+ PO) Take 1 tablet by mouth daily.     . simvastatin (ZOCOR) 20 MG tablet Take 1 tablet (20 mg total) by mouth daily at 6 PM. 90 tablet 3  . sulfamethoxazole-trimethoprim (SEPTRA DS) 800-160 MG per tablet Take 1 tablet by mouth 2 (two) times daily. 20 tablet 0   No current facility-administered medications for this visit.    Family History  Problem Relation Age of Onset  . Cancer    . Cancer Father   . Cancer Sister   . Cancer Brother     History   Social History  . Marital Status: Divorced    Spouse Name: N/A  . Number of Children: N/A  . Years of Education: N/A   Occupational History  . retired    Social History Main Topics  . Smoking status: Former Smoker    Types: Cigarettes    Quit date: 07/14/1983  . Smokeless tobacco: Never Used  . Alcohol Use: No  . Drug Use: No  . Sexual Activity: Not on file   Other Topics Concern  . Not on file   Social History  Narrative     ROS: [x]  Positive   [ ]  Negative   [ ]  All sytems reviewed and are negative  Cardiovascular: []  chest pain/pressure []  palpitations []  SOB lying flat []  DOE []  pain in legs while walking []  pain in feet when lying flat []  hx of DVT []  hx of phlebitis [x]  swelling in legs []  varicose veins  Pulmonary: []  productive cough []  asthma []  wheezing  Neurologic: []  weakness in []  arms []  legs []  numbness in []  arms []  legs [] difficulty speaking or slurred speech []  temporary loss of vision in one eye []  dizziness  Hematologic: []  bleeding problems []  problems with blood clotting easily  GI []  vomiting blood []  blood in stool  GU: []  burning with urination []  blood in urine  Psychiatric: []  hx of major depression  Integumentary: []  rashes []  ulcers  Constitutional: []  fever []  chills   PHYSICAL EXAMINATION:  Filed Vitals:   05/23/15 1314  BP: 148/84  Pulse: 75   Body mass index is 33.36 kg/(m^2).  General:  WDWN in NAD Gait: Slow with  Popov HENT: WNL, normocephalic Pulmonary: normal non-labored breathing , without Rales, rhonchi,  wheezing Cardiac: RRR Abdomen: obese with well healed laparotomy scar Skin:  Chronic venous changes BLE with right > left Vascular Exam/Pulses: Right groin with pulsatile mass ~ 4cm; 2+ left femoral pulse; 2+ right DP pulse; left pedal pulses not palpable Extremities: without ischemic changes, without Gangrene , without cellulitis; without open wounds; RLE edema with chronic venous stasis changes Musculoskeletal: no muscle wasting or atrophy  Neurologic: A&O X 3; Appropriate Affect ; SENSATION: normal; MOTOR FUNCTION:  moving all extremities equally. Speech is fluent/normal   Non-Invasive Vascular Imaging:   Lower Extremity Venous Duplex Reflux Evaluation 05/23/15 1.  No evidence of DVT or superficial venous thrombophlebitis in the RLE 2.  Right saphenous vein could not be identified 3.  Evidence of deep venous reflux in the right common femoral, femoral and PT veins.  Peroneal veins not visualized.  **Non-vascularized fluid filled cystic structure in the proximal mid thigh measuring 1.7cm x 2.2cm. **Significant extravasation is observed in the right groin.  Flow is pulsatile with velocities > 400cm/s but appear to communicate with the common femoral vein.  (Does not look like a fistula; vein itself is normal and compressible).  Pt meds includes: Statin:  Yes.   Beta Blocker:  Yes.   Aspirin:  Yes.   ACEI:  No. ARB:  No. Other Antiplatelet/Anticoagulant:  Yes.   Plavix   ASSESSMENT/PLAN:: 81 y.o. male with hx of chronic venous insufficiency and recent penile cancer with penectomy and bilateral lymph node groin dissection now with worsening swelling in his right groin and thigh that has somewhat improved over the past few days   -pt does have a pulsatile mass in his right groin. (lymph node vs recurrence of cancer vs pseudoaneurysm).  Will obtain a CTA of the abdomen and pelvis to  evaluate this and the pt will return to see Dr. Oneida Alar in 2 weeks to discuss results. -continue to wear compression stockings -pt will most likely need f/u at [facility] for his penile cancer in the future. -pt is on Plavix.  Leontine Locket, PA-C [facility] [phone]  Clinic MD:  Pt seen and examined in conjunction with Dr. Oneida Alar    History and exam findings as above. Patient has chronic swelling of his right lower extremity peripheral worse recently. He has a widened pulse in his right femoral region.  He has previously had a lymphadenectomy in this region. Duplex ultrasound also suggested possible mass in the right groin. We will obtain a CT angiogram abdomen and pelvis to further evaluate the pulsatile mass in his right groin. This would be to evaluate for aneurysm as well as possible cancer recurrence. The patient will follow-up with me after his CT scan.  Ruta Hinds, MD [facility] [place] Office: [phone] Pager: [phone]

## 2015-05-29 DIAGNOSIS — I2510 Atherosclerotic heart disease of native coronary artery without angina pectoris: Secondary | ICD-10-CM | POA: Diagnosis not present

## 2015-05-29 DIAGNOSIS — Z483 Aftercare following surgery for neoplasm: Secondary | ICD-10-CM | POA: Diagnosis not present

## 2015-05-29 DIAGNOSIS — M21372 Foot drop, left foot: Secondary | ICD-10-CM | POA: Diagnosis not present

## 2015-05-29 DIAGNOSIS — N312 Flaccid neuropathic bladder, not elsewhere classified: Secondary | ICD-10-CM | POA: Diagnosis not present

## 2015-05-29 DIAGNOSIS — N400 Enlarged prostate without lower urinary tract symptoms: Secondary | ICD-10-CM | POA: Diagnosis not present

## 2015-05-29 DIAGNOSIS — C609 Malignant neoplasm of penis, unspecified: Secondary | ICD-10-CM | POA: Diagnosis not present

## 2015-05-31 ENCOUNTER — Ambulatory Visit (HOSPITAL_COMMUNITY)
Admission: RE | Admit: 2015-05-31 | Discharge: 2015-05-31 | Disposition: A | Discharge status: Home / Self Care | Payer: Medicare Other | Source: Ambulatory Visit | Attending: Vascular Surgery | Admitting: Vascular Surgery

## 2015-05-31 DIAGNOSIS — M5136 Other intervertebral disc degeneration, lumbar region: Secondary | ICD-10-CM | POA: Insufficient documentation

## 2015-05-31 DIAGNOSIS — I872 Venous insufficiency (chronic) (peripheral): Secondary | ICD-10-CM | POA: Diagnosis not present

## 2015-05-31 DIAGNOSIS — Z9079 Acquired absence of other genital organ(s): Secondary | ICD-10-CM | POA: Insufficient documentation

## 2015-05-31 DIAGNOSIS — K5730 Diverticulosis of large intestine without perforation or abscess without bleeding: Secondary | ICD-10-CM | POA: Diagnosis not present

## 2015-05-31 DIAGNOSIS — R934 Abnormal findings on diagnostic imaging of urinary organs: Secondary | ICD-10-CM | POA: Diagnosis not present

## 2015-05-31 DIAGNOSIS — R1909 Other intra-abdominal and pelvic swelling, mass and lump: Secondary | ICD-10-CM | POA: Diagnosis not present

## 2015-05-31 DIAGNOSIS — Z8549 Personal history of malignant neoplasm of other male genital organs: Secondary | ICD-10-CM

## 2015-05-31 LAB — POCT I-STAT CREATININE: Creatinine, Ser: 1.3 mg/dL — ABNORMAL HIGH (ref 0.61–1.24)

## 2015-05-31 MED ORDER — IOHEXOL 350 MG/ML SOLN
100.0000 mL | Freq: Once | INTRAVENOUS | Status: AC | PRN
  Administered 2015-05-31: 100 mL via INTRAVENOUS

## 2015-06-03 ENCOUNTER — Encounter: Payer: Self-pay | Admitting: Vascular Surgery

## 2015-06-05 DIAGNOSIS — I2510 Atherosclerotic heart disease of native coronary artery without angina pectoris: Secondary | ICD-10-CM | POA: Diagnosis not present

## 2015-06-05 DIAGNOSIS — N312 Flaccid neuropathic bladder, not elsewhere classified: Secondary | ICD-10-CM | POA: Diagnosis not present

## 2015-06-05 DIAGNOSIS — C609 Malignant neoplasm of penis, unspecified: Secondary | ICD-10-CM | POA: Diagnosis not present

## 2015-06-05 DIAGNOSIS — M21372 Foot drop, left foot: Secondary | ICD-10-CM | POA: Diagnosis not present

## 2015-06-05 DIAGNOSIS — Z483 Aftercare following surgery for neoplasm: Secondary | ICD-10-CM | POA: Diagnosis not present

## 2015-06-05 DIAGNOSIS — N400 Enlarged prostate without lower urinary tract symptoms: Secondary | ICD-10-CM | POA: Diagnosis not present

## 2015-06-06 ENCOUNTER — Ambulatory Visit (INDEPENDENT_AMBULATORY_CARE_PROVIDER_SITE_OTHER): Payer: Medicare Other | Admitting: Vascular Surgery

## 2015-06-06 ENCOUNTER — Encounter: Payer: Self-pay | Admitting: Vascular Surgery

## 2015-06-06 VITALS — BP 128/56 | HR 67 | Resp 16 | Ht 72.0 in | Wt 246.0 lb

## 2015-06-06 DIAGNOSIS — M7989 Other specified soft tissue disorders: Secondary | ICD-10-CM

## 2015-06-06 NOTE — Progress Notes (Signed)
  HISTORY AND PHYSICAL     CC:  Leg swelling right leg Referring Provider:  Redmond School, MD  HPI: Patient returns for follow-up today after recent CT scan of abdomen and pelvis. I have seen him in the past venous insufficiency. His right leg is swelling more and he has had more swelling in his thigh and groin.  He states that earlier this year, he was diagnosed with penile cancer and underwent a penectomy in February as well as lymph node dissection of both groins.   He does wear compression stockings.  He does have a hx of laser ablation at [place] approximately 6 years ago.  He has never had a DVT.    He has hx of cardiac stent and is on Plavix.      ROS: [x]  Positive   [ ]  Negative   [ ]  All sytems reviewed and are negative  Cardiovascular: []  chest pain/pressure []  palpitations []  SOB lying flat []  DOE []  pain in legs while walking []  pain in feet when lying flat []  hx of DVT []  hx of phlebitis [x]  swelling in legs []  varicose veins  PHYSICAL EXAMINATION:    Filed Vitals:   06/06/15 1140  BP: 128/56  Pulse: 67  Resp: 16  Height: 6' (1.829 m)  Weight: 111.585 kg (246 lb)    General:  WDWN in NAD HENT: WNL, normocephalic Pulmonary: normal non-labored breathing , without Rales, rhonchi,  wheezing Skin:  Chronic venous changes BLE with right > left Vascular Exam/Pulses: Right groin with pulsatile mass ~ 4cm; 2+ left femoral pulse; 2+ right DP pulse; left pedal pulses not palpable Extremities: without ischemic changes, without Gangrene , without cellulitis; without open wounds; RLE edema with chronic venous stasis changes Musculoskeletal: no muscle wasting or atrophy       Neurologic: A&O X 3; Appropriate Affect ; SENSATION: normal; MOTOR FUNCTION:  moving all extremities equally. Speech is fluent/normal   Non-Invasive Vascular Imaging:   Lower Extremity Venous Duplex Reflux Evaluation 05/23/15 1.  No evidence of DVT or superficial venous thrombophlebitis in  the RLE 2.  Right saphenous vein could not be identified 3.  Evidence of deep venous reflux in the right common femoral, femoral and PT veins.  Peroneal veins not visualized.  **Non-vascularized fluid filled cystic structure in the proximal mid thigh measuring 1.7cm x 2.2cm. **Significant extravasation is observed in the right groin.  Flow is pulsatile with velocities > 400cm/s but appear to communicate with the common femoral vein.  (Does not look like a fistula; vein itself is normal and compressible).  CT abdomen and pelvis shows no aneurysmal dilation of the right common femoral vein. There is no obvious lymphadenopathy. There is no obstruction of the vein.  ASSESSMENT/PLAN:: 81 y.o. male with chronic lower extremity swelling. Most likely multifactorial from lymphadenectomy as well as prior history of deep vein reflux. Mainstay of therapy will be compression long-term. The patient will follow-up on as-needed basis.  Ruta Hinds, MD [facility] [place] Office: [phone] Pager: [phone]

## 2015-06-11 DIAGNOSIS — C609 Malignant neoplasm of penis, unspecified: Secondary | ICD-10-CM | POA: Diagnosis not present

## 2015-06-12 DIAGNOSIS — C609 Malignant neoplasm of penis, unspecified: Secondary | ICD-10-CM | POA: Diagnosis not present

## 2015-06-12 DIAGNOSIS — Z483 Aftercare following surgery for neoplasm: Secondary | ICD-10-CM | POA: Diagnosis not present

## 2015-06-12 DIAGNOSIS — N400 Enlarged prostate without lower urinary tract symptoms: Secondary | ICD-10-CM | POA: Diagnosis not present

## 2015-06-12 DIAGNOSIS — M21372 Foot drop, left foot: Secondary | ICD-10-CM | POA: Diagnosis not present

## 2015-06-12 DIAGNOSIS — I2510 Atherosclerotic heart disease of native coronary artery without angina pectoris: Secondary | ICD-10-CM | POA: Diagnosis not present

## 2015-06-12 DIAGNOSIS — N312 Flaccid neuropathic bladder, not elsewhere classified: Secondary | ICD-10-CM | POA: Diagnosis not present

## 2015-06-13 DIAGNOSIS — H3531 Nonexudative age-related macular degeneration: Secondary | ICD-10-CM | POA: Diagnosis not present

## 2015-06-13 DIAGNOSIS — H3532 Exudative age-related macular degeneration: Secondary | ICD-10-CM | POA: Diagnosis not present

## 2015-06-19 ENCOUNTER — Encounter: Payer: Self-pay | Admitting: *Deleted

## 2015-06-21 ENCOUNTER — Ambulatory Visit (INDEPENDENT_AMBULATORY_CARE_PROVIDER_SITE_OTHER): Payer: Medicare Other | Admitting: Cardiovascular Disease

## 2015-06-21 ENCOUNTER — Encounter: Payer: Self-pay | Admitting: Cardiovascular Disease

## 2015-06-21 VITALS — BP 120/60 | HR 72 | Ht 72.0 in | Wt 250.0 lb

## 2015-06-21 DIAGNOSIS — E785 Hyperlipidemia, unspecified: Secondary | ICD-10-CM | POA: Diagnosis not present

## 2015-06-21 DIAGNOSIS — I2510 Atherosclerotic heart disease of native coronary artery without angina pectoris: Secondary | ICD-10-CM | POA: Diagnosis not present

## 2015-06-21 NOTE — Progress Notes (Signed)
06/21/2015 Caleb [name]   10-27-1925  [id number]  Primary Physician Purvis Kilts, MD Primary Cardiologist: Lorretta Harp MD Renae Gloss   HPI:  The patient is an 81 year old, overweight Caucasian male with a history of coronary artery disease status post RCA PCI and stenting with a Cypher drug-eluting stent December 2006. He also had a 50% mid LAD lesion with normal LV function at that time. His last 2D echocardiogram was in 2006 as well which showed normal LV function possibly at the lower limits of normal. He has mild to moderate aortic sclerosis without stenosis. Mild to moderate mitral annular calcification. Left atrium was mildly dilated. He also has a history of hyperlipidemia.   The patient presents today with complaints of paresthesia or numbness and tingling in his upper extremities typically right before he wakes up. He also has the problem intermittently in his lower extremities. He does not have these issues during the day and it is not until after he has been asleep and then wakes up that he has these problems. Once he gets up from sleeping and he starts moving around the symptoms go away. He also has a history of ruptured disk somewhere in the lower back approximately 5 to 6 years ago. He has had surgery for that. He also reports that he fell, essentially tripped over a slipper and fell to his right knee approximately 2.5 weeks ago. This has caused swelling in his right lower extremity since then. He did have a lower extremity venous Doppler on April 20, 2013, which showed no evidence of lower extremity deep vein thrombosis. He denies nausea, vomiting, fever, lightheadedness, dizziness, shortness of breath, orthopnea, paroxysmal nocturnal dyspnea, chest pain, claudication, abdominal pain.  He did see Dr. Hart Robinsons, orthopedic surgeon, since he was seen last for evaluation of bilateral upper extremity numbness which apparently was thought to be either carpal  tunnel syndrome or cervical radiculopathy.he has right lower extremity swelling apparently had venous Dopplers but that did not show DVT.   I saw him back one year ago. Since that time he denies chest pain or shortness of breath.   Current Outpatient Prescriptions  Medication Sig Dispense Refill  . Ascorbic Acid (VITAMIN C) 1000 MG tablet Take 1,000 mg by mouth daily.     Marland Kitchen aspirin 81 MG tablet Take 81 mg by mouth daily.      . Cholecalciferol (VITAMIN D3) 400 UNITS CAPS Take 1 capsule by mouth daily.     . clopidogrel (PLAVIX) 75 MG tablet Take 75 mg by mouth daily.    . metoprolol tartrate (LOPRESSOR) 25 MG tablet Take 25 mg by mouth 2 (two) times daily.    . Multiple Vitamins-Minerals (CENTRUM SILVER ADULT 50+ PO) Take 1 tablet by mouth daily.     . simvastatin (ZOCOR) 20 MG tablet Take 1 tablet (20 mg total) by mouth daily at 6 PM. 90 tablet 3   No current facility-administered medications for this visit.    No Known Allergies  History   Social History  . Marital Status: Divorced    Spouse Name: N/A  . Number of Children: N/A  . Years of Education: N/A   Occupational History  . retired    Social History Main Topics  . Smoking status: Former Smoker    Types: Cigarettes    Quit date: 07/14/1983  . Smokeless tobacco: Never Used  . Alcohol Use: No  . Drug Use: No  . Sexual Activity: Not on file  Other Topics Concern  . Not on file   Social History Narrative     Review of Systems: General: negative for chills, fever, night sweats or weight changes.  Cardiovascular: negative for chest pain, dyspnea on exertion, edema, orthopnea, palpitations, paroxysmal nocturnal dyspnea or shortness of breath Dermatological: negative for rash Respiratory: negative for cough or wheezing Urologic: negative for hematuria Abdominal: negative for nausea, vomiting, diarrhea, bright red blood per rectum, melena, or hematemesis Neurologic: negative for visual changes, syncope, or  dizziness All other systems reviewed and are otherwise negative except as noted above.    Blood pressure 120/60, pulse 72, height 6' (1.829 m), weight 250 lb (113.399 kg).  General appearance: alert and no distress Neck: no adenopathy, no carotid bruit, no JVD, supple, symmetrical, trachea midline and thyroid not enlarged, symmetric, no tenderness/mass/nodules Lungs: clear to auscultation bilaterally Heart: regular rate and rhythm, S1, S2 normal, no murmur, click, rub or gallop Extremities: extremities normal, atraumatic, no cyanosis or edema  EKG normal sinus rhythm at 72 without ST or T-wave changes. I personally reviewed this EKG  ASSESSMENT AND PLAN:   Hyperlipidemia History of hyperlipidemia on simvastatin 20 followed by his PCP  Coronary artery disease History of CAD status post RCA stenting with a Cypher drug-eluting stent by myself December 2006. The time he had a 50% mid LAD lesion and  Normal LV function. He denies chest pain or shortness of breath.      Lorretta Harp MD FACP,FACC,FAHA, [facility] 06/21/2015 12:34 PM

## 2015-06-21 NOTE — Assessment & Plan Note (Signed)
History of hyperlipidemia on simvastatin 20 followed by his PCP

## 2015-06-21 NOTE — Assessment & Plan Note (Signed)
History of CAD status post RCA stenting with a Cypher drug-eluting stent by myself December 2006. The time he had a 50% mid LAD lesion and  Normal LV function. He denies chest pain or shortness of breath.

## 2015-06-21 NOTE — Patient Instructions (Signed)
Your physician wants you to follow-up in: 1 year with Dr Berry. You will receive a reminder letter in the mail two months in advance. If you don't receive a letter, please call our office to schedule the follow-up appointment.  

## 2015-06-26 DIAGNOSIS — M21372 Foot drop, left foot: Secondary | ICD-10-CM | POA: Diagnosis not present

## 2015-06-26 DIAGNOSIS — N312 Flaccid neuropathic bladder, not elsewhere classified: Secondary | ICD-10-CM | POA: Diagnosis not present

## 2015-06-26 DIAGNOSIS — Z483 Aftercare following surgery for neoplasm: Secondary | ICD-10-CM | POA: Diagnosis not present

## 2015-06-26 DIAGNOSIS — N400 Enlarged prostate without lower urinary tract symptoms: Secondary | ICD-10-CM | POA: Diagnosis not present

## 2015-06-26 DIAGNOSIS — C609 Malignant neoplasm of penis, unspecified: Secondary | ICD-10-CM | POA: Diagnosis not present

## 2015-06-26 DIAGNOSIS — I2510 Atherosclerotic heart disease of native coronary artery without angina pectoris: Secondary | ICD-10-CM | POA: Diagnosis not present

## 2015-07-12 DIAGNOSIS — N400 Enlarged prostate without lower urinary tract symptoms: Secondary | ICD-10-CM | POA: Diagnosis not present

## 2015-07-12 DIAGNOSIS — N390 Urinary tract infection, site not specified: Secondary | ICD-10-CM | POA: Diagnosis not present

## 2015-07-12 DIAGNOSIS — Z7902 Long term (current) use of antithrombotics/antiplatelets: Secondary | ICD-10-CM | POA: Diagnosis not present

## 2015-07-12 DIAGNOSIS — Z87891 Personal history of nicotine dependence: Secondary | ICD-10-CM | POA: Diagnosis not present

## 2015-07-12 DIAGNOSIS — I890 Lymphedema, not elsewhere classified: Secondary | ICD-10-CM | POA: Diagnosis not present

## 2015-07-12 DIAGNOSIS — Z466 Encounter for fitting and adjustment of urinary device: Secondary | ICD-10-CM | POA: Diagnosis not present

## 2015-07-12 DIAGNOSIS — Z8589 Personal history of malignant neoplasm of other organs and systems: Secondary | ICD-10-CM | POA: Diagnosis not present

## 2015-07-12 DIAGNOSIS — M21372 Foot drop, left foot: Secondary | ICD-10-CM | POA: Diagnosis not present

## 2015-07-12 DIAGNOSIS — I2510 Atherosclerotic heart disease of native coronary artery without angina pectoris: Secondary | ICD-10-CM | POA: Diagnosis not present

## 2015-07-12 DIAGNOSIS — N312 Flaccid neuropathic bladder, not elsewhere classified: Secondary | ICD-10-CM | POA: Diagnosis not present

## 2015-07-17 DIAGNOSIS — N400 Enlarged prostate without lower urinary tract symptoms: Secondary | ICD-10-CM | POA: Diagnosis not present

## 2015-07-17 DIAGNOSIS — N390 Urinary tract infection, site not specified: Secondary | ICD-10-CM | POA: Diagnosis not present

## 2015-07-17 DIAGNOSIS — I890 Lymphedema, not elsewhere classified: Secondary | ICD-10-CM | POA: Diagnosis not present

## 2015-07-17 DIAGNOSIS — M21372 Foot drop, left foot: Secondary | ICD-10-CM | POA: Diagnosis not present

## 2015-07-17 DIAGNOSIS — I2510 Atherosclerotic heart disease of native coronary artery without angina pectoris: Secondary | ICD-10-CM | POA: Diagnosis not present

## 2015-07-17 DIAGNOSIS — N312 Flaccid neuropathic bladder, not elsewhere classified: Secondary | ICD-10-CM | POA: Diagnosis not present

## 2015-07-19 ENCOUNTER — Encounter: Payer: Self-pay | Admitting: Cardiovascular Disease

## 2015-07-19 DIAGNOSIS — N400 Enlarged prostate without lower urinary tract symptoms: Secondary | ICD-10-CM | POA: Diagnosis not present

## 2015-07-19 DIAGNOSIS — N312 Flaccid neuropathic bladder, not elsewhere classified: Secondary | ICD-10-CM | POA: Diagnosis not present

## 2015-07-19 DIAGNOSIS — I890 Lymphedema, not elsewhere classified: Secondary | ICD-10-CM | POA: Diagnosis not present

## 2015-07-19 DIAGNOSIS — N390 Urinary tract infection, site not specified: Secondary | ICD-10-CM | POA: Diagnosis not present

## 2015-07-19 DIAGNOSIS — I2510 Atherosclerotic heart disease of native coronary artery without angina pectoris: Secondary | ICD-10-CM | POA: Diagnosis not present

## 2015-07-19 DIAGNOSIS — M21372 Foot drop, left foot: Secondary | ICD-10-CM | POA: Diagnosis not present

## 2015-07-24 ENCOUNTER — Other Ambulatory Visit: Payer: Self-pay | Admitting: Cardiovascular Disease

## 2015-07-24 DIAGNOSIS — I890 Lymphedema, not elsewhere classified: Secondary | ICD-10-CM | POA: Diagnosis not present

## 2015-07-24 DIAGNOSIS — I2510 Atherosclerotic heart disease of native coronary artery without angina pectoris: Secondary | ICD-10-CM | POA: Diagnosis not present

## 2015-07-24 DIAGNOSIS — M21372 Foot drop, left foot: Secondary | ICD-10-CM | POA: Diagnosis not present

## 2015-07-24 DIAGNOSIS — N312 Flaccid neuropathic bladder, not elsewhere classified: Secondary | ICD-10-CM | POA: Diagnosis not present

## 2015-07-24 DIAGNOSIS — N390 Urinary tract infection, site not specified: Secondary | ICD-10-CM | POA: Diagnosis not present

## 2015-07-24 DIAGNOSIS — N400 Enlarged prostate without lower urinary tract symptoms: Secondary | ICD-10-CM | POA: Diagnosis not present

## 2015-07-24 NOTE — Telephone Encounter (Signed)
REFILL 

## 2015-07-26 DIAGNOSIS — E782 Mixed hyperlipidemia: Secondary | ICD-10-CM | POA: Diagnosis not present

## 2015-07-26 DIAGNOSIS — Z1389 Encounter for screening for other disorder: Secondary | ICD-10-CM | POA: Diagnosis not present

## 2015-07-26 DIAGNOSIS — N365 Urethral false passage: Secondary | ICD-10-CM | POA: Diagnosis not present

## 2015-07-26 DIAGNOSIS — M21372 Foot drop, left foot: Secondary | ICD-10-CM | POA: Diagnosis not present

## 2015-07-26 DIAGNOSIS — C609 Malignant neoplasm of penis, unspecified: Secondary | ICD-10-CM | POA: Diagnosis not present

## 2015-07-26 DIAGNOSIS — N390 Urinary tract infection, site not specified: Secondary | ICD-10-CM | POA: Diagnosis not present

## 2015-07-26 DIAGNOSIS — E6609 Other obesity due to excess calories: Secondary | ICD-10-CM | POA: Diagnosis not present

## 2015-07-26 DIAGNOSIS — Z6833 Body mass index (BMI) 33.0-33.9, adult: Secondary | ICD-10-CM | POA: Diagnosis not present

## 2015-07-26 DIAGNOSIS — I890 Lymphedema, not elsewhere classified: Secondary | ICD-10-CM | POA: Diagnosis not present

## 2015-07-26 DIAGNOSIS — N312 Flaccid neuropathic bladder, not elsewhere classified: Secondary | ICD-10-CM | POA: Diagnosis not present

## 2015-07-26 DIAGNOSIS — C61 Malignant neoplasm of prostate: Secondary | ICD-10-CM | POA: Diagnosis not present

## 2015-07-26 DIAGNOSIS — N400 Enlarged prostate without lower urinary tract symptoms: Secondary | ICD-10-CM | POA: Diagnosis not present

## 2015-07-26 DIAGNOSIS — I2510 Atherosclerotic heart disease of native coronary artery without angina pectoris: Secondary | ICD-10-CM | POA: Diagnosis not present

## 2015-07-31 DIAGNOSIS — I2510 Atherosclerotic heart disease of native coronary artery without angina pectoris: Secondary | ICD-10-CM | POA: Diagnosis not present

## 2015-07-31 DIAGNOSIS — I890 Lymphedema, not elsewhere classified: Secondary | ICD-10-CM | POA: Diagnosis not present

## 2015-07-31 DIAGNOSIS — M21372 Foot drop, left foot: Secondary | ICD-10-CM | POA: Diagnosis not present

## 2015-07-31 DIAGNOSIS — N390 Urinary tract infection, site not specified: Secondary | ICD-10-CM | POA: Diagnosis not present

## 2015-07-31 DIAGNOSIS — N400 Enlarged prostate without lower urinary tract symptoms: Secondary | ICD-10-CM | POA: Diagnosis not present

## 2015-07-31 DIAGNOSIS — N312 Flaccid neuropathic bladder, not elsewhere classified: Secondary | ICD-10-CM | POA: Diagnosis not present

## 2015-08-01 DIAGNOSIS — H3532 Exudative age-related macular degeneration: Secondary | ICD-10-CM | POA: Diagnosis not present

## 2015-08-06 DIAGNOSIS — R69 Illness, unspecified: Secondary | ICD-10-CM | POA: Diagnosis not present

## 2015-08-07 DIAGNOSIS — N312 Flaccid neuropathic bladder, not elsewhere classified: Secondary | ICD-10-CM | POA: Diagnosis not present

## 2015-08-07 DIAGNOSIS — N390 Urinary tract infection, site not specified: Secondary | ICD-10-CM | POA: Diagnosis not present

## 2015-08-07 DIAGNOSIS — I890 Lymphedema, not elsewhere classified: Secondary | ICD-10-CM | POA: Diagnosis not present

## 2015-08-07 DIAGNOSIS — N400 Enlarged prostate without lower urinary tract symptoms: Secondary | ICD-10-CM | POA: Diagnosis not present

## 2015-08-07 DIAGNOSIS — I2510 Atherosclerotic heart disease of native coronary artery without angina pectoris: Secondary | ICD-10-CM | POA: Diagnosis not present

## 2015-08-07 DIAGNOSIS — M21372 Foot drop, left foot: Secondary | ICD-10-CM | POA: Diagnosis not present

## 2015-08-14 DIAGNOSIS — I2510 Atherosclerotic heart disease of native coronary artery without angina pectoris: Secondary | ICD-10-CM | POA: Diagnosis not present

## 2015-08-14 DIAGNOSIS — N390 Urinary tract infection, site not specified: Secondary | ICD-10-CM | POA: Diagnosis not present

## 2015-08-14 DIAGNOSIS — M21372 Foot drop, left foot: Secondary | ICD-10-CM | POA: Diagnosis not present

## 2015-08-14 DIAGNOSIS — I890 Lymphedema, not elsewhere classified: Secondary | ICD-10-CM | POA: Diagnosis not present

## 2015-08-14 DIAGNOSIS — N312 Flaccid neuropathic bladder, not elsewhere classified: Secondary | ICD-10-CM | POA: Diagnosis not present

## 2015-08-14 DIAGNOSIS — N400 Enlarged prostate without lower urinary tract symptoms: Secondary | ICD-10-CM | POA: Diagnosis not present

## 2015-08-23 DIAGNOSIS — N390 Urinary tract infection, site not specified: Secondary | ICD-10-CM | POA: Diagnosis not present

## 2015-08-23 DIAGNOSIS — N400 Enlarged prostate without lower urinary tract symptoms: Secondary | ICD-10-CM | POA: Diagnosis not present

## 2015-08-23 DIAGNOSIS — I890 Lymphedema, not elsewhere classified: Secondary | ICD-10-CM | POA: Diagnosis not present

## 2015-08-23 DIAGNOSIS — N312 Flaccid neuropathic bladder, not elsewhere classified: Secondary | ICD-10-CM | POA: Diagnosis not present

## 2015-08-23 DIAGNOSIS — M21372 Foot drop, left foot: Secondary | ICD-10-CM | POA: Diagnosis not present

## 2015-08-23 DIAGNOSIS — I2510 Atherosclerotic heart disease of native coronary artery without angina pectoris: Secondary | ICD-10-CM | POA: Diagnosis not present

## 2015-08-28 DIAGNOSIS — M21372 Foot drop, left foot: Secondary | ICD-10-CM | POA: Diagnosis not present

## 2015-08-28 DIAGNOSIS — N312 Flaccid neuropathic bladder, not elsewhere classified: Secondary | ICD-10-CM | POA: Diagnosis not present

## 2015-08-28 DIAGNOSIS — N400 Enlarged prostate without lower urinary tract symptoms: Secondary | ICD-10-CM | POA: Diagnosis not present

## 2015-08-28 DIAGNOSIS — N390 Urinary tract infection, site not specified: Secondary | ICD-10-CM | POA: Diagnosis not present

## 2015-08-28 DIAGNOSIS — I890 Lymphedema, not elsewhere classified: Secondary | ICD-10-CM | POA: Diagnosis not present

## 2015-08-28 DIAGNOSIS — I2510 Atherosclerotic heart disease of native coronary artery without angina pectoris: Secondary | ICD-10-CM | POA: Diagnosis not present

## 2015-10-16 DIAGNOSIS — Z23 Encounter for immunization: Secondary | ICD-10-CM | POA: Diagnosis not present

## 2015-11-07 DIAGNOSIS — R208 Other disturbances of skin sensation: Secondary | ICD-10-CM | POA: Diagnosis not present

## 2015-12-02 ENCOUNTER — Ambulatory Visit (INDEPENDENT_AMBULATORY_CARE_PROVIDER_SITE_OTHER): Payer: Medicare Other | Admitting: *Deleted

## 2015-12-02 VITALS — BP 140/60 | HR 58 | Ht 72.0 in

## 2015-12-02 DIAGNOSIS — R071 Chest pain on breathing: Secondary | ICD-10-CM | POA: Diagnosis not present

## 2015-12-02 DIAGNOSIS — I2510 Atherosclerotic heart disease of native coronary artery without angina pectoris: Secondary | ICD-10-CM

## 2015-12-02 DIAGNOSIS — R0789 Other chest pain: Secondary | ICD-10-CM | POA: Insufficient documentation

## 2015-12-02 NOTE — Progress Notes (Signed)
Patient walked into office this afternoon. Patient states he has had a twitch/twinge in the center if his chest all day yesterday and today. He states no radiation of pain, no pain in chest, no shortness of breath ,  No diaphoresis. patient was concerned -he states he had a stent placed 2006-wondering if something wrong with stent placement. RN reassured patient - informed patient stent dose not cause any twinge as he described.  EKG , blood pressure done  medication reviewed Reviewed with Dr Martinique   NO CHANGES WITH CURRENT TREATMENT

## 2015-12-10 DIAGNOSIS — C609 Malignant neoplasm of penis, unspecified: Secondary | ICD-10-CM | POA: Diagnosis not present

## 2015-12-12 DIAGNOSIS — H353122 Nonexudative age-related macular degeneration, left eye, intermediate dry stage: Secondary | ICD-10-CM | POA: Diagnosis not present

## 2015-12-12 DIAGNOSIS — H353212 Exudative age-related macular degeneration, right eye, with inactive choroidal neovascularization: Secondary | ICD-10-CM | POA: Diagnosis not present

## 2016-01-12 ENCOUNTER — Emergency Department (HOSPITAL_COMMUNITY): Payer: No Typology Code available for payment source

## 2016-01-12 ENCOUNTER — Encounter (HOSPITAL_COMMUNITY): Payer: Self-pay | Admitting: Emergency Medicine

## 2016-01-12 ENCOUNTER — Observation Stay (HOSPITAL_COMMUNITY)
Admission: EM | Admit: 2016-01-12 | Discharge: 2016-01-14 | Disposition: A | Discharge status: Home / Self Care | Payer: No Typology Code available for payment source | Attending: General Surgery | Admitting: General Surgery

## 2016-01-12 DIAGNOSIS — Z951 Presence of aortocoronary bypass graft: Secondary | ICD-10-CM | POA: Insufficient documentation

## 2016-01-12 DIAGNOSIS — E785 Hyperlipidemia, unspecified: Secondary | ICD-10-CM | POA: Insufficient documentation

## 2016-01-12 DIAGNOSIS — S91012A Laceration without foreign body, left ankle, initial encounter: Secondary | ICD-10-CM | POA: Insufficient documentation

## 2016-01-12 DIAGNOSIS — Z23 Encounter for immunization: Secondary | ICD-10-CM | POA: Diagnosis not present

## 2016-01-12 DIAGNOSIS — S0093XA Contusion of unspecified part of head, initial encounter: Secondary | ICD-10-CM | POA: Diagnosis not present

## 2016-01-12 DIAGNOSIS — I739 Peripheral vascular disease, unspecified: Secondary | ICD-10-CM | POA: Insufficient documentation

## 2016-01-12 DIAGNOSIS — S43102A Unspecified dislocation of left acromioclavicular joint, initial encounter: Secondary | ICD-10-CM | POA: Insufficient documentation

## 2016-01-12 DIAGNOSIS — Z7982 Long term (current) use of aspirin: Secondary | ICD-10-CM | POA: Insufficient documentation

## 2016-01-12 DIAGNOSIS — S270XXA Traumatic pneumothorax, initial encounter: Secondary | ICD-10-CM | POA: Diagnosis not present

## 2016-01-12 DIAGNOSIS — I10 Essential (primary) hypertension: Secondary | ICD-10-CM | POA: Insufficient documentation

## 2016-01-12 DIAGNOSIS — S0003XA Contusion of scalp, initial encounter: Principal | ICD-10-CM | POA: Insufficient documentation

## 2016-01-12 DIAGNOSIS — S51012A Laceration without foreign body of left elbow, initial encounter: Secondary | ICD-10-CM | POA: Insufficient documentation

## 2016-01-12 DIAGNOSIS — Z87891 Personal history of nicotine dependence: Secondary | ICD-10-CM | POA: Insufficient documentation

## 2016-01-12 DIAGNOSIS — S61210A Laceration without foreign body of right index finger without damage to nail, initial encounter: Secondary | ICD-10-CM | POA: Insufficient documentation

## 2016-01-12 DIAGNOSIS — Z79899 Other long term (current) drug therapy: Secondary | ICD-10-CM | POA: Diagnosis not present

## 2016-01-12 DIAGNOSIS — R079 Chest pain, unspecified: Secondary | ICD-10-CM | POA: Diagnosis present

## 2016-01-12 DIAGNOSIS — Z789 Other specified health status: Secondary | ICD-10-CM | POA: Diagnosis present

## 2016-01-12 DIAGNOSIS — R220 Localized swelling, mass and lump, head: Secondary | ICD-10-CM | POA: Diagnosis not present

## 2016-01-12 DIAGNOSIS — T148 Other injury of unspecified body region: Secondary | ICD-10-CM | POA: Diagnosis not present

## 2016-01-12 DIAGNOSIS — Z7902 Long term (current) use of antithrombotics/antiplatelets: Secondary | ICD-10-CM | POA: Diagnosis not present

## 2016-01-12 DIAGNOSIS — I2510 Atherosclerotic heart disease of native coronary artery without angina pectoris: Secondary | ICD-10-CM | POA: Insufficient documentation

## 2016-01-12 DIAGNOSIS — J939 Pneumothorax, unspecified: Secondary | ICD-10-CM | POA: Diagnosis present

## 2016-01-12 DIAGNOSIS — S0083XA Contusion of other part of head, initial encounter: Secondary | ICD-10-CM | POA: Diagnosis not present

## 2016-01-12 HISTORY — DX: Malignant neoplasm of penis, unspecified: C60.9

## 2016-01-12 LAB — CBC WITH DIFFERENTIAL/PLATELET
BASOS ABS: 0 10*3/uL (ref 0.0–0.1)
BASOS PCT: 0 %
EOS ABS: 0 10*3/uL (ref 0.0–0.7)
Eosinophils Relative: 0 %
HEMATOCRIT: 36.3 % — AB (ref 39.0–52.0)
Hemoglobin: 11.8 g/dL — ABNORMAL LOW (ref 13.0–17.0)
Lymphocytes Relative: 5 %
Lymphs Abs: 0.6 10*3/uL — ABNORMAL LOW (ref 0.7–4.0)
MCH: 29.8 pg (ref 26.0–34.0)
MCHC: 32.5 g/dL (ref 30.0–36.0)
MCV: 91.7 fL (ref 78.0–100.0)
MONO ABS: 0.7 10*3/uL (ref 0.1–1.0)
MONOS PCT: 6 %
NEUTROS ABS: 9.9 10*3/uL — AB (ref 1.7–7.7)
NEUTROS PCT: 89 %
Platelets: 164 10*3/uL (ref 150–400)
RBC: 3.96 MIL/uL — ABNORMAL LOW (ref 4.22–5.81)
RDW: 14.4 % (ref 11.5–15.5)
WBC: 11.2 10*3/uL — ABNORMAL HIGH (ref 4.0–10.5)

## 2016-01-12 LAB — BASIC METABOLIC PANEL
ANION GAP: 11 (ref 5–15)
BUN: 22 mg/dL — ABNORMAL HIGH (ref 6–20)
CALCIUM: 9.2 mg/dL (ref 8.9–10.3)
CO2: 23 mmol/L (ref 22–32)
CREATININE: 1.15 mg/dL (ref 0.61–1.24)
Chloride: 106 mmol/L (ref 101–111)
GFR, EST NON AFRICAN AMERICAN: 55 mL/min — AB (ref >60)
GLUCOSE: 137 mg/dL — AB (ref 65–99)
Potassium: 4 mmol/L (ref 3.5–5.1)
SODIUM: 140 mmol/L (ref 135–145)

## 2016-01-12 LAB — PROTIME-INR
INR: 1.24 (ref 0.00–1.49)
PROTHROMBIN TIME: 15.8 s — AB (ref 11.6–15.2)

## 2016-01-12 MED ORDER — SIMVASTATIN 20 MG PO TABS
20.0000 mg | ORAL_TABLET | Freq: Every day | ORAL | Status: DC
  Administered 2016-01-13: 20 mg via ORAL
  Filled 2016-01-12: qty 1

## 2016-01-12 MED ORDER — SODIUM CHLORIDE 0.9 % IJ SOLN
3.0000 mL | Freq: Two times a day (BID) | INTRAMUSCULAR | Status: DC
  Administered 2016-01-13: 3 mL via INTRAVENOUS

## 2016-01-12 MED ORDER — ONDANSETRON HCL 4 MG/2ML IJ SOLN: 4.0000 mg | Freq: Four times a day (QID) | INTRAMUSCULAR | Status: DC | PRN

## 2016-01-12 MED ORDER — TETANUS-DIPHTH-ACELL PERTUSSIS 5-2.5-18.5 LF-MCG/0.5 IM SUSP
0.5000 mL | Freq: Once | INTRAMUSCULAR | Status: AC
  Administered 2016-01-12: 0.5 mL via INTRAMUSCULAR
  Filled 2016-01-12: qty 0.5

## 2016-01-12 MED ORDER — METOPROLOL TARTRATE 25 MG PO TABS
25.0000 mg | ORAL_TABLET | Freq: Two times a day (BID) | ORAL | Status: DC
  Administered 2016-01-13 – 2016-01-14 (×4): 25 mg via ORAL
  Filled 2016-01-12 (×4): qty 1

## 2016-01-12 MED ORDER — ONDANSETRON HCL 4 MG PO TABS
4.0000 mg | ORAL_TABLET | Freq: Four times a day (QID) | ORAL | Status: DC | PRN
Start: 2016-01-12 — End: 2016-01-14

## 2016-01-12 MED ORDER — ADULT MULTIVITAMIN W/MINERALS CH
1.0000 | ORAL_TABLET | Freq: Every day | ORAL | Status: DC
  Administered 2016-01-13 – 2016-01-14 (×2): 1 via ORAL
  Filled 2016-01-12 (×2): qty 1

## 2016-01-12 MED ORDER — HYDROCODONE-ACETAMINOPHEN 5-325 MG PO TABS
1.0000 | ORAL_TABLET | ORAL | Status: DC | PRN
  Administered 2016-01-13 (×2): 1 via ORAL
  Filled 2016-01-12 (×2): qty 1

## 2016-01-12 MED ORDER — MORPHINE SULFATE (PF) 2 MG/ML IV SOLN: 2.0000 mg | INTRAVENOUS | Status: DC | PRN

## 2016-01-12 MED ORDER — CALCIUM CARBONATE-VITAMIN D 500-200 MG-UNIT PO TABS
1.0000 | ORAL_TABLET | Freq: Every day | ORAL | Status: DC
  Administered 2016-01-13 – 2016-01-14 (×2): 1 via ORAL
  Filled 2016-01-12 (×4): qty 1

## 2016-01-12 MED ORDER — SODIUM CHLORIDE 0.9 % IJ SOLN: 3.0000 mL | INTRAMUSCULAR | Status: DC | PRN

## 2016-01-12 MED ORDER — SODIUM CHLORIDE 0.9 % IV SOLN: 250.0000 mL | INTRAVENOUS | Status: DC | PRN
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
## 2016-01-12 NOTE — ED Notes (Signed)
Caleb [name]'s # [phone] AND [name] [phone]

## 2016-01-13 ENCOUNTER — Encounter (HOSPITAL_COMMUNITY): Payer: Self-pay | Admitting: General Surgery

## 2016-01-13 ENCOUNTER — Observation Stay (HOSPITAL_COMMUNITY): Payer: No Typology Code available for payment source

## 2016-01-13 DIAGNOSIS — S270XXA Traumatic pneumothorax, initial encounter: Secondary | ICD-10-CM | POA: Diagnosis not present

## 2016-01-13 DIAGNOSIS — Z789 Other specified health status: Secondary | ICD-10-CM | POA: Diagnosis present

## 2016-01-13 DIAGNOSIS — S0003XA Contusion of scalp, initial encounter: Secondary | ICD-10-CM | POA: Diagnosis present

## 2016-01-13 DIAGNOSIS — J939 Pneumothorax, unspecified: Secondary | ICD-10-CM | POA: Diagnosis not present

## 2016-01-13 DIAGNOSIS — S0083XA Contusion of other part of head, initial encounter: Secondary | ICD-10-CM | POA: Diagnosis not present

## 2016-01-13 MED ORDER — TRAMADOL HCL 50 MG PO TABS
50.0000 mg | ORAL_TABLET | Freq: Four times a day (QID) | ORAL | Status: DC | PRN
  Administered 2016-01-13: 50 mg via ORAL
  Filled 2016-01-13: qty 1

## 2016-01-13 NOTE — ED Notes (Addendum)
Pt refusing IV, adamant that he does not want to get stuck again. Pt of sound mind, capable of refusing. Charge nurse notified.
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
## 2016-01-13 NOTE — Consult Note (Addendum)
WOC wound consult note Reason for Consult: Consult requested for several skin tears.   Left outer ankle partial thickness; 2X.2X.1cm, red and moist Left elbow full thickness; 6X6X.1cm, red and moist Right 1st finger with steristrips in place over area approx .5X.1cm Drainage (amount, consistency, odor) No odor, small amy yellow drainage to sites Periwound: Intact skin surrounding Dressing procedure/placement/frequency: Foam dressings to absorb drainage and promote healing. Please re-consult if further assistance is needed.  Thank-you,  Julien Girt MSN, [place], [place], [place], [place]

## 2016-01-13 NOTE — ED Notes (Signed)
Attempted report 

## 2016-01-13 NOTE — ED Notes (Signed)
Attempted report, no answer on 6N.
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
## 2016-01-14 ENCOUNTER — Observation Stay (HOSPITAL_COMMUNITY): Payer: No Typology Code available for payment source

## 2016-01-14 DIAGNOSIS — S0083XA Contusion of other part of head, initial encounter: Secondary | ICD-10-CM | POA: Diagnosis not present

## 2016-01-14 DIAGNOSIS — J939 Pneumothorax, unspecified: Secondary | ICD-10-CM | POA: Diagnosis not present

## 2016-01-14 DIAGNOSIS — S0003XA Contusion of scalp, initial encounter: Secondary | ICD-10-CM | POA: Diagnosis not present

## 2016-01-14 DIAGNOSIS — S270XXA Traumatic pneumothorax, initial encounter: Secondary | ICD-10-CM | POA: Diagnosis not present

## 2016-01-14 LAB — CBC
HCT: 33.7 % — ABNORMAL LOW (ref 39.0–52.0)
HEMOGLOBIN: 10.7 g/dL — AB (ref 13.0–17.0)
MCH: 29.6 pg (ref 26.0–34.0)
MCHC: 31.8 g/dL (ref 30.0–36.0)
MCV: 93.4 fL (ref 78.0–100.0)
Platelets: 147 10*3/uL — ABNORMAL LOW (ref 150–400)
RBC: 3.61 MIL/uL — ABNORMAL LOW (ref 4.22–5.81)
RDW: 14.7 % (ref 11.5–15.5)
WBC: 7.9 10*3/uL (ref 4.0–10.5)

## 2016-01-14 LAB — BASIC METABOLIC PANEL
Anion gap: 7 (ref 5–15)
BUN: 19 mg/dL (ref 6–20)
CALCIUM: 9.1 mg/dL (ref 8.9–10.3)
CHLORIDE: 106 mmol/L (ref 101–111)
CO2: 25 mmol/L (ref 22–32)
CREATININE: 1.12 mg/dL (ref 0.61–1.24)
GFR calc non Af Amer: 57 mL/min — ABNORMAL LOW (ref >60)
Glucose, Bld: 103 mg/dL — ABNORMAL HIGH (ref 65–99)
Potassium: 4.1 mmol/L (ref 3.5–5.1)
SODIUM: 138 mmol/L (ref 135–145)

## 2016-01-14 MED ORDER — ACETAMINOPHEN 325 MG PO TABS: 325.0000 mg | ORAL_TABLET | Freq: Four times a day (QID) | ORAL | Status: DC | PRN

## 2016-01-14 MED ORDER — TRAMADOL HCL 50 MG PO TABS: 50.0000 mg | ORAL_TABLET | Freq: Four times a day (QID) | ORAL | Status: DC | PRN

## 2016-01-14 NOTE — Progress Notes (Signed)
Orthopedic Tech Progress Note Patient Details:  CARRINGTON SPARTA [birth year] [id number]  Ortho Devices Type of Ortho Device: Arm sling Ortho Device/Splint Location: lue Ortho Device/Splint Interventions: Application   Masahiro Iglesia 01/14/2016, 11:44 AM
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
## 2016-01-14 NOTE — Progress Notes (Signed)
Discharge paperwork given to patient. IV removed. Prescriptions given to patient. Patient is ready to discharge.
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
## 2016-01-14 NOTE — Discharge Instructions (Addendum)
Shoulder Separation A shoulder separation (acromioclavicular separation) is an injury to the connecting tissue (ligament) between the top of your shoulder blade (acromion) and your collarbone (clavicle). The ligament may be stretched, partially torn, or completely torn.  A stretched ligament may not cause very much pain, and it does not move the collarbone out of place. A stretched ligament looks normal on an X-ray.  An injury that is a bit worse may partially tear a ligament and move the collarbone slightly out of place.  A serious injury completely tears both shoulder ligaments. This moves the collarbone severely out of position and changes the way that the shoulder looks (deformity). CAUSES The most common cause of a shoulder separation is falling on or receiving a blow to the top of the shoulder. Falling with an outstretched arm may also cause this injury. RISK FACTORS You may be at greater risk of a shoulder separation if:  You are male.  You are younger than age 49.  You play a contact sport, such as football or hockey. SIGNS AND SYMPTOMS The most common symptom of a shoulder separation is pain on the top of the shoulder after falling on it or receiving a blow to it. Other signs and symptoms include:  Shoulder deformity.  Swelling of the shoulder.  Decreased ability to move the shoulder.  Bruising on top of the shoulder. DIAGNOSIS Your health care provider may suspect a shoulder separation based on your symptoms and the details of a recent injury. A physical exam will be done. During this exam, the health care provider may:  Press on your shoulder.  Test the movement of your shoulder.  Ask you to hold a weight in your hand to see if the separation increases.  Do an X-ray. TREATMENT  A stretch injury may require only a sling, pain medicine, and cold packs. This treatment may last for 2-12 weeks. You may also have physical therapy. A physical therapist will teach you to  do daily exercises to strengthen your shoulder muscles and prevent stiffness.  A complete tear may require surgery to repair the torn ligament. After surgery, you will also require a sling, pain medicine, and cold packs. Recovery may take longer. You may also need more physical therapy. HOME CARE INSTRUCTIONS  Take medicines only as directed by your health care provider.  Apply ice to the top of your shoulder:  Put ice in a plastic bag.  Place a towel between your skin and the bag.  Leave the ice on for 20 minutes, 2-3 times a day.  Wear your sling or splint as directed by your health care provider.  You may be able to remove your sling to do your physical therapy exercises.  Ask your health care provider when you can stop wearing the sling.  Do not do any activities that make your pain worse.  Do not lift anything that is heavier than 10 lb (4.5 kg) on the injured side of your body.  Ask your health care provider when you can return to athletic activities. SEEK MEDICAL CARE IF:  Your pain medicine is not relieving your pain.  Your pain and stiffness are not improving after 2 weeks.  You are unable to do your physical therapy exercises because of pain or stiffness.   This information is not intended to replace advice given to you by your health care provider. Make sure you discuss any questions you have with your health care provider.   Document Released: 09/23/2005 Document Revised: 01/04/2015  Document Reviewed: 05/16/2014 Elsevier Interactive Patient Education 2016 [employer].   Pneumothorax A pneumothorax, commonly called a collapsed lung, is a condition in which air leaks from a lung and builds up in the space between the lung and the chest wall (pleural space). The air in a pneumothorax is trapped outside the lung and takes up space, preventing the lung from fully expanding. This is a condition that usually occurs suddenly. The buildup of air may be small or large. A  small pneumothorax may go away on its own. When a pneumothorax is larger, it will often require medical treatment and hospitalization.  CAUSES  A pneumothorax can sometimes happen quickly with no apparent cause. People with underlying lung problems, particularly COPD or emphysema, are at higher risk of pneumothorax. However, pneumothorax can happen quickly even in people with no prior known lung problems. Trauma, surgery, medical procedures, or injury to the chest wall can also cause a pneumothorax. SIGNS AND SYMPTOMS  Sometimes a pneumothorax will have no symptoms. When symptoms are present, they can include:  Chest pain.  Shortness of breath.  Increased rate of breathing.  Bluish color to your lips or skin (cyanosis). DIAGNOSIS  Pneumothorax is usually diagnosed by a chest X-ray or chest CT scan. Your health care provider will also take a medical history and perform a physical exam to determine why you may have a pneumothorax. TREATMENT  A small pneumothorax may go away on its own without treatment. Extra oxygen can sometimes help a small pneumothorax go away more quickly. For a larger pneumothorax or a pneumothorax that is causing symptoms, a procedure is usually needed to drain the air.In some cases, the health care provider may drain the air using a needle. In other cases, a chest tube may be inserted into the pleural space. A chest tube is a small tube placed between the ribs and into the pleural space. This removes the extra air and allows the lung to expand back to its normal size. A large pneumothorax will usually require a hospital stay. If there is ongoing air leakage into the pleural space, then the chest tube may need to remain in place for several days until the air leak has healed. In some cases, surgery may be needed.  HOME CARE INSTRUCTIONS   Only take over-the-counter or prescription medicines as directed by your health care provider.  If a cough or pain makes it difficult for  you to sleep at night, try sleeping in a semi-upright position in a recliner or by using 2 or 3 pillows.  Rest and limit activity as directed by your health care provider.  If you had a chest tube and it was removed, ask your health care provider when it is okay to remove the dressing. Until your health care provider says you can remove the dressing, do not allow it to get wet.  Do not smoke. Smoking is a risk factor for pneumothorax.  Do not fly in an airplane or scuba dive until your health care provider says it is okay.  Follow up with your health care provider as directed. SEEK IMMEDIATE MEDICAL CARE IF:   You have increasing chest pain or shortness of breath.  You have a cough that is not controlled with suppressants.  You begin coughing up blood.  You have pain that is getting worse or is not controlled with medicines.  You cough up thick, discolored mucus (sputum) that is yellow to green in color.  You have redness, increasing pain, or  discharge at the site where a chest tube had been in place (if your pneumothorax was treated with a chest tube).  The site where your chest tube was located opens up.  You feel air coming out of the site where the chest tube was placed.  You have a fever or persistent symptoms for more than 2-3 days.  You have a fever and your symptoms suddenly get worse. MAKE SURE YOU:   Understand these instructions.  Will watch your condition.  Will get help right away if you are not doing well or get worse.   This information is not intended to replace advice given to you by your health care provider. Make sure you discuss any questions you have with your health care provider.   Document Released: 12/14/2005 Document Revised: 10/04/2013 Document Reviewed: 07/13/2013 Elsevier Interactive Patient Education [employer].

## 2016-01-15 DIAGNOSIS — T1490 Injury, unspecified: Secondary | ICD-10-CM | POA: Diagnosis not present

## 2016-01-16 DIAGNOSIS — Z8549 Personal history of malignant neoplasm of other male genital organs: Secondary | ICD-10-CM | POA: Diagnosis not present

## 2016-01-16 DIAGNOSIS — G8911 Acute pain due to trauma: Secondary | ICD-10-CM | POA: Diagnosis not present

## 2016-01-16 DIAGNOSIS — Z7901 Long term (current) use of anticoagulants: Secondary | ICD-10-CM | POA: Diagnosis not present

## 2016-01-16 DIAGNOSIS — Z9079 Acquired absence of other genital organ(s): Secondary | ICD-10-CM | POA: Diagnosis not present

## 2016-01-16 DIAGNOSIS — I2510 Atherosclerotic heart disease of native coronary artery without angina pectoris: Secondary | ICD-10-CM | POA: Diagnosis not present

## 2016-01-16 DIAGNOSIS — I739 Peripheral vascular disease, unspecified: Secondary | ICD-10-CM | POA: Diagnosis not present

## 2016-01-16 DIAGNOSIS — N312 Flaccid neuropathic bladder, not elsewhere classified: Secondary | ICD-10-CM | POA: Diagnosis not present

## 2016-01-16 DIAGNOSIS — R2689 Other abnormalities of gait and mobility: Secondary | ICD-10-CM | POA: Diagnosis not present

## 2016-01-16 DIAGNOSIS — S81821D Laceration with foreign body, right lower leg, subsequent encounter: Secondary | ICD-10-CM | POA: Diagnosis not present

## 2016-01-16 DIAGNOSIS — I10 Essential (primary) hypertension: Secondary | ICD-10-CM | POA: Diagnosis not present

## 2016-01-16 DIAGNOSIS — M6281 Muscle weakness (generalized): Secondary | ICD-10-CM | POA: Diagnosis not present

## 2016-01-16 DIAGNOSIS — S51022D Laceration with foreign body of left elbow, subsequent encounter: Secondary | ICD-10-CM | POA: Diagnosis not present

## 2016-01-17 DIAGNOSIS — G8911 Acute pain due to trauma: Secondary | ICD-10-CM | POA: Diagnosis not present

## 2016-01-17 DIAGNOSIS — S81821D Laceration with foreign body, right lower leg, subsequent encounter: Secondary | ICD-10-CM | POA: Diagnosis not present

## 2016-01-17 DIAGNOSIS — R2689 Other abnormalities of gait and mobility: Secondary | ICD-10-CM | POA: Diagnosis not present

## 2016-01-17 DIAGNOSIS — I10 Essential (primary) hypertension: Secondary | ICD-10-CM | POA: Diagnosis not present

## 2016-01-17 DIAGNOSIS — S51022D Laceration with foreign body of left elbow, subsequent encounter: Secondary | ICD-10-CM | POA: Diagnosis not present

## 2016-01-17 DIAGNOSIS — M6281 Muscle weakness (generalized): Secondary | ICD-10-CM | POA: Diagnosis not present

## 2016-01-20 DIAGNOSIS — S51022D Laceration with foreign body of left elbow, subsequent encounter: Secondary | ICD-10-CM | POA: Diagnosis not present

## 2016-01-20 DIAGNOSIS — G8911 Acute pain due to trauma: Secondary | ICD-10-CM | POA: Diagnosis not present

## 2016-01-20 DIAGNOSIS — S81821D Laceration with foreign body, right lower leg, subsequent encounter: Secondary | ICD-10-CM | POA: Diagnosis not present

## 2016-01-20 DIAGNOSIS — M6281 Muscle weakness (generalized): Secondary | ICD-10-CM | POA: Diagnosis not present

## 2016-01-20 DIAGNOSIS — I10 Essential (primary) hypertension: Secondary | ICD-10-CM | POA: Diagnosis not present

## 2016-01-20 DIAGNOSIS — R2689 Other abnormalities of gait and mobility: Secondary | ICD-10-CM | POA: Diagnosis not present

## 2016-01-21 DIAGNOSIS — T148 Other injury of unspecified body region: Secondary | ICD-10-CM | POA: Diagnosis not present

## 2016-01-21 DIAGNOSIS — E6609 Other obesity due to excess calories: Secondary | ICD-10-CM | POA: Diagnosis not present

## 2016-01-21 DIAGNOSIS — S40012D Contusion of left shoulder, subsequent encounter: Secondary | ICD-10-CM | POA: Diagnosis not present

## 2016-01-21 DIAGNOSIS — Z1389 Encounter for screening for other disorder: Secondary | ICD-10-CM | POA: Diagnosis not present

## 2016-01-21 DIAGNOSIS — S0003XD Contusion of scalp, subsequent encounter: Secondary | ICD-10-CM | POA: Diagnosis not present

## 2016-01-21 DIAGNOSIS — Z6831 Body mass index (BMI) 31.0-31.9, adult: Secondary | ICD-10-CM | POA: Diagnosis not present

## 2016-01-22 DIAGNOSIS — M6281 Muscle weakness (generalized): Secondary | ICD-10-CM | POA: Diagnosis not present

## 2016-01-22 DIAGNOSIS — S81821D Laceration with foreign body, right lower leg, subsequent encounter: Secondary | ICD-10-CM | POA: Diagnosis not present

## 2016-01-22 DIAGNOSIS — I10 Essential (primary) hypertension: Secondary | ICD-10-CM | POA: Diagnosis not present

## 2016-01-22 DIAGNOSIS — G8911 Acute pain due to trauma: Secondary | ICD-10-CM | POA: Diagnosis not present

## 2016-01-22 DIAGNOSIS — S51022D Laceration with foreign body of left elbow, subsequent encounter: Secondary | ICD-10-CM | POA: Diagnosis not present

## 2016-01-22 DIAGNOSIS — R2689 Other abnormalities of gait and mobility: Secondary | ICD-10-CM | POA: Diagnosis not present

## 2016-01-29 DIAGNOSIS — S81821D Laceration with foreign body, right lower leg, subsequent encounter: Secondary | ICD-10-CM | POA: Diagnosis not present

## 2016-01-29 DIAGNOSIS — G8911 Acute pain due to trauma: Secondary | ICD-10-CM | POA: Diagnosis not present

## 2016-01-29 DIAGNOSIS — M6281 Muscle weakness (generalized): Secondary | ICD-10-CM | POA: Diagnosis not present

## 2016-01-29 DIAGNOSIS — R2689 Other abnormalities of gait and mobility: Secondary | ICD-10-CM | POA: Diagnosis not present

## 2016-01-29 DIAGNOSIS — I10 Essential (primary) hypertension: Secondary | ICD-10-CM | POA: Diagnosis not present

## 2016-01-29 DIAGNOSIS — S51022D Laceration with foreign body of left elbow, subsequent encounter: Secondary | ICD-10-CM | POA: Diagnosis not present

## 2016-01-30 DIAGNOSIS — G5792 Unspecified mononeuropathy of left lower limb: Secondary | ICD-10-CM | POA: Diagnosis not present

## 2016-01-31 DIAGNOSIS — I10 Essential (primary) hypertension: Secondary | ICD-10-CM | POA: Diagnosis not present

## 2016-01-31 DIAGNOSIS — G8911 Acute pain due to trauma: Secondary | ICD-10-CM | POA: Diagnosis not present

## 2016-01-31 DIAGNOSIS — R2689 Other abnormalities of gait and mobility: Secondary | ICD-10-CM | POA: Diagnosis not present

## 2016-01-31 DIAGNOSIS — S81821D Laceration with foreign body, right lower leg, subsequent encounter: Secondary | ICD-10-CM | POA: Diagnosis not present

## 2016-01-31 DIAGNOSIS — M6281 Muscle weakness (generalized): Secondary | ICD-10-CM | POA: Diagnosis not present

## 2016-01-31 DIAGNOSIS — S51022D Laceration with foreign body of left elbow, subsequent encounter: Secondary | ICD-10-CM | POA: Diagnosis not present

## 2016-02-05 DIAGNOSIS — M6281 Muscle weakness (generalized): Secondary | ICD-10-CM | POA: Diagnosis not present

## 2016-02-05 DIAGNOSIS — S51022D Laceration with foreign body of left elbow, subsequent encounter: Secondary | ICD-10-CM | POA: Diagnosis not present

## 2016-02-05 DIAGNOSIS — S81821D Laceration with foreign body, right lower leg, subsequent encounter: Secondary | ICD-10-CM | POA: Diagnosis not present

## 2016-02-05 DIAGNOSIS — I10 Essential (primary) hypertension: Secondary | ICD-10-CM | POA: Diagnosis not present

## 2016-02-05 DIAGNOSIS — G8911 Acute pain due to trauma: Secondary | ICD-10-CM | POA: Diagnosis not present

## 2016-02-05 DIAGNOSIS — R2689 Other abnormalities of gait and mobility: Secondary | ICD-10-CM | POA: Diagnosis not present

## 2016-02-13 DIAGNOSIS — E538 Deficiency of other specified B group vitamins: Secondary | ICD-10-CM | POA: Diagnosis not present

## 2016-02-13 DIAGNOSIS — G609 Hereditary and idiopathic neuropathy, unspecified: Secondary | ICD-10-CM | POA: Diagnosis not present

## 2016-02-13 DIAGNOSIS — M21372 Foot drop, left foot: Secondary | ICD-10-CM | POA: Diagnosis not present

## 2016-02-13 DIAGNOSIS — M5417 Radiculopathy, lumbosacral region: Secondary | ICD-10-CM | POA: Diagnosis not present

## 2016-02-13 DIAGNOSIS — G5602 Carpal tunnel syndrome, left upper limb: Secondary | ICD-10-CM | POA: Diagnosis not present

## 2016-02-13 DIAGNOSIS — E531 Pyridoxine deficiency: Secondary | ICD-10-CM | POA: Diagnosis not present

## 2016-02-13 DIAGNOSIS — R634 Abnormal weight loss: Secondary | ICD-10-CM | POA: Diagnosis not present

## 2016-02-13 DIAGNOSIS — R202 Paresthesia of skin: Secondary | ICD-10-CM | POA: Diagnosis not present

## 2016-02-13 DIAGNOSIS — M5412 Radiculopathy, cervical region: Secondary | ICD-10-CM | POA: Diagnosis not present

## 2016-02-13 DIAGNOSIS — G5620 Lesion of ulnar nerve, unspecified upper limb: Secondary | ICD-10-CM | POA: Diagnosis not present

## 2016-02-13 DIAGNOSIS — G603 Idiopathic progressive neuropathy: Secondary | ICD-10-CM | POA: Diagnosis not present

## 2016-02-17 ENCOUNTER — Emergency Department (HOSPITAL_COMMUNITY): Payer: Medicare Other

## 2016-02-17 ENCOUNTER — Encounter (HOSPITAL_COMMUNITY): Payer: Self-pay | Admitting: *Deleted

## 2016-02-17 ENCOUNTER — Emergency Department (HOSPITAL_COMMUNITY)
Admission: EM | Admit: 2016-02-17 | Discharge: 2016-02-17 | Disposition: A | Discharge status: Home / Self Care | Payer: Medicare Other | Attending: Emergency Medicine | Admitting: Emergency Medicine

## 2016-02-17 DIAGNOSIS — Z8549 Personal history of malignant neoplasm of other male genital organs: Secondary | ICD-10-CM | POA: Insufficient documentation

## 2016-02-17 DIAGNOSIS — Z87891 Personal history of nicotine dependence: Secondary | ICD-10-CM | POA: Diagnosis not present

## 2016-02-17 DIAGNOSIS — I2510 Atherosclerotic heart disease of native coronary artery without angina pectoris: Secondary | ICD-10-CM | POA: Diagnosis not present

## 2016-02-17 DIAGNOSIS — E785 Hyperlipidemia, unspecified: Secondary | ICD-10-CM | POA: Insufficient documentation

## 2016-02-17 DIAGNOSIS — R350 Frequency of micturition: Secondary | ICD-10-CM | POA: Insufficient documentation

## 2016-02-17 DIAGNOSIS — E669 Obesity, unspecified: Secondary | ICD-10-CM | POA: Diagnosis not present

## 2016-02-17 DIAGNOSIS — Z9889 Other specified postprocedural states: Secondary | ICD-10-CM | POA: Diagnosis not present

## 2016-02-17 DIAGNOSIS — Z79899 Other long term (current) drug therapy: Secondary | ICD-10-CM | POA: Diagnosis not present

## 2016-02-17 DIAGNOSIS — Z9861 Coronary angioplasty status: Secondary | ICD-10-CM | POA: Insufficient documentation

## 2016-02-17 DIAGNOSIS — R591 Generalized enlarged lymph nodes: Secondary | ICD-10-CM | POA: Diagnosis not present

## 2016-02-17 DIAGNOSIS — Z951 Presence of aortocoronary bypass graft: Secondary | ICD-10-CM | POA: Insufficient documentation

## 2016-02-17 DIAGNOSIS — L539 Erythematous condition, unspecified: Secondary | ICD-10-CM | POA: Insufficient documentation

## 2016-02-17 DIAGNOSIS — Z7902 Long term (current) use of antithrombotics/antiplatelets: Secondary | ICD-10-CM | POA: Diagnosis not present

## 2016-02-17 DIAGNOSIS — R590 Localized enlarged lymph nodes: Secondary | ICD-10-CM | POA: Insufficient documentation

## 2016-02-17 DIAGNOSIS — Z7982 Long term (current) use of aspirin: Secondary | ICD-10-CM | POA: Diagnosis not present

## 2016-02-17 DIAGNOSIS — C609 Malignant neoplasm of penis, unspecified: Secondary | ICD-10-CM

## 2016-02-17 DIAGNOSIS — R339 Retention of urine, unspecified: Secondary | ICD-10-CM | POA: Diagnosis present

## 2016-02-17 DIAGNOSIS — N489 Disorder of penis, unspecified: Secondary | ICD-10-CM | POA: Diagnosis not present

## 2016-02-17 DIAGNOSIS — R1909 Other intra-abdominal and pelvic swelling, mass and lump: Secondary | ICD-10-CM | POA: Diagnosis not present

## 2016-02-17 LAB — CBC WITH DIFFERENTIAL/PLATELET
BASOS PCT: 0 %
Basophils Absolute: 0 10*3/uL (ref 0.0–0.1)
EOS ABS: 0.1 10*3/uL (ref 0.0–0.7)
Eosinophils Relative: 1 %
HCT: 34.6 % — ABNORMAL LOW (ref 39.0–52.0)
HEMOGLOBIN: 11.1 g/dL — AB (ref 13.0–17.0)
Lymphocytes Relative: 14 %
Lymphs Abs: 1.2 10*3/uL (ref 0.7–4.0)
MCH: 29.6 pg (ref 26.0–34.0)
MCHC: 32.1 g/dL (ref 30.0–36.0)
MCV: 92.3 fL (ref 78.0–100.0)
MONOS PCT: 11 %
Monocytes Absolute: 0.9 10*3/uL (ref 0.1–1.0)
NEUTROS PCT: 74 %
Neutro Abs: 6.3 10*3/uL (ref 1.7–7.7)
Platelets: 196 10*3/uL (ref 150–400)
RBC: 3.75 MIL/uL — ABNORMAL LOW (ref 4.22–5.81)
RDW: 14.2 % (ref 11.5–15.5)
WBC: 8.5 10*3/uL (ref 4.0–10.5)

## 2016-02-17 LAB — URINALYSIS, ROUTINE W REFLEX MICROSCOPIC
BILIRUBIN URINE: NEGATIVE
Glucose, UA: NEGATIVE mg/dL
KETONES UR: NEGATIVE mg/dL
NITRITE: POSITIVE — AB
PH: 6 (ref 5.0–8.0)
PROTEIN: NEGATIVE mg/dL
Specific Gravity, Urine: 1.015 (ref 1.005–1.030)

## 2016-02-17 LAB — BASIC METABOLIC PANEL
Anion gap: 9 (ref 5–15)
BUN: 21 mg/dL — AB (ref 6–20)
CO2: 22 mmol/L (ref 22–32)
CREATININE: 1.15 mg/dL (ref 0.61–1.24)
Calcium: 9.4 mg/dL (ref 8.9–10.3)
Chloride: 108 mmol/L (ref 101–111)
GFR calc non Af Amer: 54 mL/min — ABNORMAL LOW (ref >60)
Glucose, Bld: 93 mg/dL (ref 65–99)
Potassium: 5.3 mmol/L — ABNORMAL HIGH (ref 3.5–5.1)
SODIUM: 139 mmol/L (ref 135–145)

## 2016-02-17 LAB — POTASSIUM: POTASSIUM: 4.4 mmol/L (ref 3.5–5.1)

## 2016-02-17 LAB — URINE MICROSCOPIC-ADD ON

## 2016-02-17 MED ORDER — DIATRIZOATE MEGLUMINE & SODIUM 66-10 % PO SOLN
ORAL | Status: AC
  Filled 2016-02-17: qty 30

## 2016-02-17 MED ORDER — CEPHALEXIN 500 MG PO CAPS: 500.0000 mg | ORAL_CAPSULE | Freq: Three times a day (TID) | ORAL | Status: DC

## 2016-02-17 MED ORDER — IOHEXOL 300 MG/ML  SOLN
100.0000 mL | Freq: Once | INTRAMUSCULAR | Status: AC | PRN
  Administered 2016-02-17: 100 mL via INTRAVENOUS

## 2016-02-17 NOTE — ED Notes (Addendum)
Dr. Wyvonnia Dusky at bedside updating patient.

## 2016-02-17 NOTE — ED Notes (Signed)
Pt states penis removal Jan 17 due to cancer. Pt states he self-caths and only received a small amount of urine this morning. States he has noticed swelling to left inguinal area. Redness noted to the area also. Swelling first noticed yesterday.

## 2016-02-17 NOTE — ED Provider Notes (Signed)
CSN: [id number]     Arrival date & time 02/17/16  [id number] History   First MD Initiated Contact with Patient 02/17/16 [phone]     Chief Complaint  Patient presents with  . Urinary Retention     (Consider location/radiation/quality/duration/timing/severity/associated sxs/prior Treatment) HPI Comments: Patient with history of penis removal in 2016 for penile cancer at [facility]. Presenting with pain and swelling to his left groin onset yesterday. States he catheterizes himself 2 or 3 times a day and normally gets 400-600 mL of urine. He became concerned today when he only had 160 and has morning catheterization. He is not getting any chemotherapy or radiation now. He feels that he is emptying his bladder completely. There is no testicular pain. There is no vomiting or fever. There is no diarrhea. There is no chest pain or shortness of breath. Contrary to triage note his surgery was last year not January 17. He states he normally does not have the pain and redness in his left frontal area.  The history is provided by the patient.    Past Medical History  Diagnosis Date  . Hyperlipidemia   . Coronary artery disease   . Obesity   . Hyperlipidemia   . Swelling of extremity     Left Leg  . Penile cancer [facility])    Past Surgical History  Procedure Laterality Date  . Appendectomy    . Cholecystectomy    . Coronary artery bypass graft    . Hiatal hernia repair    . Back surgery    . Lymph node removal    . Coronary angioplasty  11/2005    RCA PCI AND STENTING WITH A CYPHER DES  . Transthoracic echocardiogram  12/22/2005    MILD TO MOD AORTIC SCLEROSIS W/O STENOSIS. MILD TO MODERATE MITRAL CALCIFICATION. LA- MILDLY DILATED.  Marland Kitchen Nm myocar multiple w/spect  11/26/2009    EF 62%. NORMAL MYOCARDIAL PERFUSION STUDY.   Family History  Problem Relation Age of Onset  . Cancer    . Cancer Father   . Cancer Sister   . Cancer Brother   . Cancer Sister    Social History  Substance Use Topics  . Smoking  status: Former Smoker    Types: Cigarettes    Quit date: 07/14/1983  . Smokeless tobacco: Never Used  . Alcohol Use: No    Review of Systems  Constitutional: Negative for fever, activity change and appetite change.  Respiratory: Negative for cough, chest tightness and shortness of breath.   Cardiovascular: Negative for chest pain.  Gastrointestinal: Negative for nausea, vomiting and abdominal pain.  Genitourinary: Positive for frequency, penile swelling, difficulty urinating and penile pain. Negative for dysuria, urgency, hematuria, flank pain, scrotal swelling and testicular pain.  Musculoskeletal: Negative for myalgias and arthralgias.  Neurological: Negative for dizziness, weakness, light-headedness, numbness and headaches.  A complete 10 system review of systems was obtained and all systems are negative except as noted in the HPI and PMH.      Allergies  Review of patient's allergies indicates no known allergies.  Home Medications   Prior to Admission medications   Medication Sig Start Date End Date Taking? Authorizing Provider  acetaminophen (TYLENOL) 325 MG tablet Take 1-2 tablets (325-650 mg total) by mouth every 6 (six) hours as needed for fever, headache, mild pain or moderate pain. 01/14/16  Yes Nat Christen, PA-C  Ascorbic Acid (VITAMIN C) 1000 MG tablet Take 1,000 mg by mouth daily.    Yes Historical Provider, MD  aspirin EC 81 MG tablet Take 81 mg by mouth daily.   Yes Historical Provider, MD  Calcium Carbonate-Vitamin D (CALCIUM-D PO) Take 1 tablet by mouth daily.   Yes Historical Provider, MD  clopidogrel (PLAVIX) 75 MG tablet Take 1 tablet by mouth  daily 07/24/15  Yes Lorretta Harp, MD  metoprolol tartrate (LOPRESSOR) 25 MG tablet Take 1 tablet by mouth  twice a day 07/24/15  Yes Lorretta Harp, MD  Multiple Vitamin (MULTIVITAMIN WITH MINERALS) TABS tablet Take 1 tablet by mouth daily. Centrum Silver   Yes Historical Provider, MD  simvastatin (ZOCOR) 20 MG tablet  Take 1 tablet by mouth  every night at bedtime 07/24/15  Yes Lorretta Harp, MD  cephALEXin (KEFLEX) 500 MG capsule Take 1 capsule (500 mg total) by mouth 3 (three) times daily. 02/17/16   Ezequiel Essex, MD  traMADol (ULTRAM) 50 MG tablet Take 1 tablet (50 mg total) by mouth every 6 (six) hours as needed for moderate pain. Patient not taking: Reported on 02/17/2016 01/14/16   Nat Christen, PA-C   BP 138/78 mmHg  Pulse 70  Temp(Src) 98.4 F (36.9 C) (Oral)  Resp 16  Ht 6' (1.829 m)  Wt 245 lb (111.131 kg)  BMI 33.22 kg/m2  SpO2 98% Physical Exam  Constitutional: He is oriented to person, place, and time. He appears well-developed and well-nourished. No distress.  HENT:  Head: Normocephalic and atraumatic.  Mouth/Throat: Oropharynx is clear and moist. No oropharyngeal exudate.  Eyes: Conjunctivae and EOM are normal. Pupils are equal, round, and reactive to light.  Neck: Normal range of motion. Neck supple.  No meningismus.  Cardiovascular: Normal rate, regular rhythm, normal heart sounds and intact distal pulses.   No murmur heard. Pulmonary/Chest: Effort normal and breath sounds normal. No respiratory distress.  Abdominal: Soft. There is no tenderness. There is no rebound and no guarding.  Genitourinary:  S/p penectomy.  No testicular tenderness  Erythema and induration to L groin about 3-4 cm and tender  Musculoskeletal: Normal range of motion. He exhibits no edema or tenderness.  Neurological: He is alert and oriented to person, place, and time. No cranial nerve deficit. He exhibits normal muscle tone. Coordination normal.  No ataxia on finger to nose bilaterally. No pronator drift. 5/5 strength throughout. CN 2-12 intact.Equal grip strength. Sensation intact.   Skin: Skin is warm.  Psychiatric: He has a normal mood and affect. His behavior is normal.  Nursing note and vitals reviewed.   ED Course  Procedures (including critical care time) Labs Review Labs Reviewed   URINALYSIS, ROUTINE W REFLEX MICROSCOPIC (NOT AT [facility]) - Abnormal; Notable for the following:    APPearance CLOUDY (*)    Hgb urine dipstick TRACE (*)    Nitrite POSITIVE (*)    Leukocytes, UA SMALL (*)    All other components within normal limits  CBC WITH DIFFERENTIAL/PLATELET - Abnormal; Notable for the following:    RBC 3.75 (*)    Hemoglobin 11.1 (*)    HCT 34.6 (*)    All other components within normal limits  BASIC METABOLIC PANEL - Abnormal; Notable for the following:    Potassium 5.3 (*)    BUN 21 (*)    GFR calc non Af Amer 54 (*)    All other components within normal limits  URINE MICROSCOPIC-ADD ON - Abnormal; Notable for the following:    Squamous Epithelial / LPF 0-5 (*)    Bacteria, UA MANY (*)    All  other components within normal limits  URINE CULTURE  POTASSIUM    Imaging Review Ct Abdomen Pelvis W Contrast  02/17/2016  CLINICAL DATA:  History of penile cancer. Pain, swelling and redness in left inguinal area. EXAM: CT ABDOMEN AND PELVIS WITH CONTRAST TECHNIQUE: Multidetector CT imaging of the abdomen and pelvis was performed using the standard protocol following bolus administration of intravenous contrast. CONTRAST:  179mL OMNIPAQUE IOHEXOL 300 MG/ML  SOLN COMPARISON:  05/31/2015 FINDINGS: Small left pleural effusion, new since prior study. Minimal left base atelectasis. Right lung bases clear. Heart is normal size. Densely calcified coronary arteries and mitral valve. Liver, spleen, pancreas, adrenals are unremarkable. Prior cholecystectomy. Mild prominence of the common bile duct, likely related to post cholecystectomy state. Mild bilateral parapelvic cysts. No hydronephrosis. Urinary bladder dilated, grossly unremarkable. Sigmoid diverticulosis. No active diverticulitis. Stomach and small bowel are decompressed and unremarkable. Aorta and iliac vessels are calcified, non aneurysmal. No free fluid or free air. Masses in the left inguinal region likely reflect  adenopathy, partially necrotic. The largest lymph node measures 6.0 x 4.4 cm on image 89. Other adjacent enlarged left inguinal lymph nodes also noted. No intra-abdominal or intrapelvic adenopathy. No acute bony abnormality or focal bone lesion. Degenerative changes in the lumbar spine. IMPRESSION: Partially necrotic masses in the left inguinal region, likely adenopathy. Bilateral benign-appearing renal parapelvic cysts. Small left pleural effusion. Electronically Signed   By: Rolm Baptise M.D.   On: 02/17/2016 11:00   I have personally reviewed and evaluated these images and lab results as part of my medical decision-making.   EKG Interpretation None      MDM   Final diagnoses:  Lymphadenopathy  Penile cancer ([place])   L inguinal pain and redness, hx penile cancer with penectomy in 2016. No current chemo or radiation. Bladder scan with 55 ml Urine.  Concerning enlarged lymph nodes in the left groin with erythema and induration.  Potassium 5.3. Recheck is normal. Creatinine is normal.  CT shows necrotic left inguinal lymph nodes concerning for possible recurrence of malignancy. Discussed with patient.  D/w [facility], Marcelina Morel [facility] who works with Dr. Dimas Millin.  She agrees findings are concerning for malignancy. They will arrange close follow-up this week. We'll treat with antibiotics for possible lymphadenitis.  Patient to follow-up with his [facility] urologist this week. He is aware that these findings could be concerning for recurrence of cancer. Antibiotics given for pharyngitis and possible UTI. Return precautions discussed.  Ezequiel Essex, MD 02/17/16 [phone]

## 2016-02-17 NOTE — ED Notes (Signed)
Dr. Rancour at bedside. 

## 2016-02-17 NOTE — Discharge Instructions (Signed)
Lymphadenopathy Follow up with Dr. Dimas Millin this week. As we discussed this area in your groin may be concerning for cancer. Return to the ED if you develop fever, vomiting, worsening symptoms or any other concerns. Lymphadenopathy refers to swollen or enlarged lymph glands, also called lymph nodes. Lymph glands are part of your body's defense (immune) system, which protects the body from infections, germs, and diseases. Lymph glands are found in many locations in your body, including the neck, underarm, and groin.  Many things can cause lymph glands to become enlarged. When your immune system responds to germs, such as viruses or bacteria, infection-fighting cells and fluid build up. This causes the glands to grow in size. Usually, this is not something to worry about. The swelling and any soreness often go away without treatment. However, swollen lymph glands can also be caused by a number of diseases. Your health care provider may do various tests to help determine the cause. If the cause of your swollen lymph glands cannot be found, it is important to monitor your condition to make sure the swelling goes away. HOME CARE INSTRUCTIONS Watch your condition for any changes. The following actions may help to lessen any discomfort you are feeling:  Get plenty of rest.  Take medicines only as directed by your health care provider. Your health care provider may recommend over-the-counter medicines for pain.  Apply moist heat compresses to the site of swollen lymph nodes as directed by your health care provider. This can help reduce any pain.  Check your lymph nodes daily for any changes.  Keep all follow-up visits as directed by your health care provider. This is important. SEEK MEDICAL CARE IF:  Your lymph nodes are still swollen after 2 weeks.  Your swelling increases or spreads to other areas.  Your lymph nodes are hard, seem fixed to the skin, or are growing rapidly.  Your skin over the lymph  nodes is red and inflamed.  You have a fever.  You have chills.  You have fatigue.  You develop a sore throat.  You have abdominal pain.  You have weight loss.  You have night sweats. SEEK IMMEDIATE MEDICAL CARE IF:  You notice fluid leaking from the area of the enlarged lymph node.  You have severe pain in any area of your body.  You have chest pain.  You have shortness of breath.   This information is not intended to replace advice given to you by your health care provider. Make sure you discuss any questions you have with your health care provider.   Document Released: 09/22/2008 Document Revised: 01/04/2015 Document Reviewed: 07/19/2014 Elsevier Interactive Patient Education [employer].

## 2016-02-19 DIAGNOSIS — R590 Localized enlarged lymph nodes: Secondary | ICD-10-CM | POA: Diagnosis not present

## 2016-02-19 DIAGNOSIS — C609 Malignant neoplasm of penis, unspecified: Secondary | ICD-10-CM | POA: Diagnosis not present

## 2016-02-20 LAB — URINE CULTURE

## 2016-02-21 ENCOUNTER — Telehealth (HOSPITAL_COMMUNITY): Payer: Self-pay

## 2016-02-21 NOTE — Progress Notes (Signed)
ED Antimicrobial Stewardship Positive Culture Follow Up   Caleb [name] is an 82 y.o. male who presented to [facility] on 02/17/2016 with a chief complaint of  Chief Complaint  Patient presents with  . Urinary Retention    Recent Results (from the past 720 hour(s))  Urine culture     Status: None   Collection Time: 02/17/16  8:20 AM  Result Value Ref Range Status   Specimen Description URINE, CATHETERIZED  Final   Special Requests NONE  Final   Culture   Final    >=100,000 COLONIES/mL ESCHERICHIA COLI >=100,000 COLONIES/mL ENTEROCOCCUS SPECIES Performed at [facility]    Report Status 02/20/2016 FINAL  Final   Organism ID, Bacteria ESCHERICHIA COLI  Final   Organism ID, Bacteria ENTEROCOCCUS SPECIES  Final      Susceptibility   Escherichia coli - MIC*    AMPICILLIN 8 SENSITIVE Sensitive     CEFAZOLIN <=4 SENSITIVE Sensitive     CEFTRIAXONE <=1 SENSITIVE Sensitive     CIPROFLOXACIN >=4 RESISTANT Resistant     GENTAMICIN <=1 SENSITIVE Sensitive     IMIPENEM <=0.25 SENSITIVE Sensitive     NITROFURANTOIN 64 INTERMEDIATE Intermediate     TRIMETH/SULFA >=320 RESISTANT Resistant     AMPICILLIN/SULBACTAM 4 SENSITIVE Sensitive     PIP/TAZO <=4 SENSITIVE Sensitive     * >=100,000 COLONIES/mL ESCHERICHIA COLI   Enterococcus species - MIC*    AMPICILLIN <=2 SENSITIVE Sensitive     LEVOFLOXACIN >=8 RESISTANT Resistant     NITROFURANTOIN <=16 SENSITIVE Sensitive     VANCOMYCIN 1 SENSITIVE Sensitive     * >=100,000 COLONIES/mL ENTEROCOCCUS SPECIES    [x]  Treated with Keflex, which E. Coli was sensitive to but enterococcus was not.   Continue Keflex  New antibiotic prescription: Start amoxicillin 500mg  PO BID x 7 days  ED Provider: Gloriann Loan PA-C  Reginia Naas 02/21/2016, 9:01 AM Infectious Diseases Pharmacist Phone# [phone]

## 2016-02-21 NOTE — Telephone Encounter (Signed)
Post ED Visit - Positive Culture Follow-up: Successful Patient Follow-Up  Culture assessed and recommendations reviewed by: [x]  Elenor Quinones, Pharm.D. []  Heide Guile, Pharm.D., BCPS []  Parks Neptune, Pharm.D. []  Alycia Rossetti, Pharm.D., BCPS []  [place], [place].D., BCPS, AAHIVP []  Legrand Como, Pharm.D., BCPS, AAHIVP []  Milus Glazier, Pharm.D. []  Stephens November, Pharm.D.  Positive urine culture >/= 100,000 colonies E Coli & Enterococcus species [x]  Patient discharged with antimicrobial prescription and addl treatment is now indicated []  Organism is resistant to prescribed ED discharge antimicrobial []  Patient with positive blood cultures  Changes discussed with ED provider K. Rose PA New antibiotic prescription Continue Keflex, Amoxicillin 500 mg po BID x 7 days & fax results to [facility] urologist Faxed to Dr Lysle Rubens @ urologist @ [facility] [phone] 02/21/2016 @ 11:25  Contacted patient, date 02/21/2016, time 11:34 Pt informed of dx and need for addl tx.  Rx called to [facility] [phone] and given to RPh.  Dortha Kern 02/21/2016, 11:26 AM

## 2016-02-26 DIAGNOSIS — Z6832 Body mass index (BMI) 32.0-32.9, adult: Secondary | ICD-10-CM | POA: Insufficient documentation

## 2016-02-26 DIAGNOSIS — N319 Neuromuscular dysfunction of bladder, unspecified: Secondary | ICD-10-CM

## 2016-02-26 HISTORY — DX: Neuromuscular dysfunction of bladder, unspecified: N31.9

## 2016-02-27 DIAGNOSIS — Z87891 Personal history of nicotine dependence: Secondary | ICD-10-CM | POA: Diagnosis not present

## 2016-02-27 DIAGNOSIS — Z8549 Personal history of malignant neoplasm of other male genital organs: Secondary | ICD-10-CM | POA: Diagnosis not present

## 2016-02-27 DIAGNOSIS — E785 Hyperlipidemia, unspecified: Secondary | ICD-10-CM | POA: Diagnosis not present

## 2016-02-27 DIAGNOSIS — Z5321 Procedure and treatment not carried out due to patient leaving prior to being seen by health care provider: Secondary | ICD-10-CM | POA: Diagnosis not present

## 2016-02-27 DIAGNOSIS — Z466 Encounter for fitting and adjustment of urinary device: Secondary | ICD-10-CM | POA: Diagnosis not present

## 2016-02-27 DIAGNOSIS — Z8679 Personal history of other diseases of the circulatory system: Secondary | ICD-10-CM | POA: Diagnosis not present

## 2016-02-27 DIAGNOSIS — N312 Flaccid neuropathic bladder, not elsewhere classified: Secondary | ICD-10-CM | POA: Diagnosis present

## 2016-02-27 DIAGNOSIS — R339 Retention of urine, unspecified: Secondary | ICD-10-CM | POA: Diagnosis not present

## 2016-02-27 DIAGNOSIS — R590 Localized enlarged lymph nodes: Secondary | ICD-10-CM | POA: Diagnosis not present

## 2016-02-27 DIAGNOSIS — C609 Malignant neoplasm of penis, unspecified: Secondary | ICD-10-CM | POA: Diagnosis not present

## 2016-02-27 DIAGNOSIS — E784 Other hyperlipidemia: Secondary | ICD-10-CM | POA: Diagnosis present

## 2016-02-27 DIAGNOSIS — I2510 Atherosclerotic heart disease of native coronary artery without angina pectoris: Secondary | ICD-10-CM | POA: Diagnosis present

## 2016-02-27 DIAGNOSIS — M21372 Foot drop, left foot: Secondary | ICD-10-CM | POA: Diagnosis present

## 2016-02-27 DIAGNOSIS — C774 Secondary and unspecified malignant neoplasm of inguinal and lower limb lymph nodes: Secondary | ICD-10-CM | POA: Diagnosis not present

## 2016-02-27 DIAGNOSIS — E669 Obesity, unspecified: Secondary | ICD-10-CM | POA: Diagnosis not present

## 2016-02-27 DIAGNOSIS — Z9079 Acquired absence of other genital organ(s): Secondary | ICD-10-CM | POA: Diagnosis not present

## 2016-02-27 DIAGNOSIS — B372 Candidiasis of skin and nail: Secondary | ICD-10-CM | POA: Diagnosis present

## 2016-02-29 ENCOUNTER — Encounter (HOSPITAL_COMMUNITY): Payer: Self-pay | Admitting: *Deleted

## 2016-02-29 ENCOUNTER — Emergency Department (HOSPITAL_COMMUNITY)
Admission: EM | Admit: 2016-02-29 | Discharge: 2016-02-29 | Disposition: A | Discharge status: Home / Self Care | Payer: Medicare Other | Attending: Dermatology | Admitting: Dermatology

## 2016-02-29 DIAGNOSIS — E785 Hyperlipidemia, unspecified: Secondary | ICD-10-CM | POA: Insufficient documentation

## 2016-02-29 DIAGNOSIS — Z8679 Personal history of other diseases of the circulatory system: Secondary | ICD-10-CM | POA: Insufficient documentation

## 2016-02-29 DIAGNOSIS — E669 Obesity, unspecified: Secondary | ICD-10-CM | POA: Insufficient documentation

## 2016-02-29 DIAGNOSIS — Z87891 Personal history of nicotine dependence: Secondary | ICD-10-CM | POA: Insufficient documentation

## 2016-02-29 DIAGNOSIS — Z466 Encounter for fitting and adjustment of urinary device: Secondary | ICD-10-CM | POA: Diagnosis not present

## 2016-02-29 DIAGNOSIS — Z5321 Procedure and treatment not carried out due to patient leaving prior to being seen by health care provider: Secondary | ICD-10-CM | POA: Insufficient documentation

## 2016-02-29 DIAGNOSIS — I2510 Atherosclerotic heart disease of native coronary artery without angina pectoris: Secondary | ICD-10-CM | POA: Diagnosis not present

## 2016-02-29 DIAGNOSIS — R339 Retention of urine, unspecified: Secondary | ICD-10-CM | POA: Insufficient documentation

## 2016-02-29 NOTE — ED Notes (Signed)
Patients foley irrigated and clots removed, patients urine flow continued into urine drainage bag

## 2016-02-29 NOTE — ED Notes (Signed)
Pt states he was released from [facility] today and sent home with a foley cath. Pt states he hasn't passed any urine into the new bag since 3:30 pm.

## 2016-03-02 ENCOUNTER — Encounter (HOSPITAL_COMMUNITY): Payer: Self-pay | Admitting: *Deleted

## 2016-03-02 ENCOUNTER — Emergency Department (HOSPITAL_COMMUNITY)
Admission: EM | Admit: 2016-03-02 | Discharge: 2016-03-03 | Disposition: A | Discharge status: Home / Self Care | Payer: Medicare Other | Attending: Emergency Medicine | Admitting: Emergency Medicine

## 2016-03-02 DIAGNOSIS — E785 Hyperlipidemia, unspecified: Secondary | ICD-10-CM | POA: Diagnosis not present

## 2016-03-02 DIAGNOSIS — R339 Retention of urine, unspecified: Secondary | ICD-10-CM

## 2016-03-02 DIAGNOSIS — N390 Urinary tract infection, site not specified: Secondary | ICD-10-CM | POA: Diagnosis not present

## 2016-03-02 DIAGNOSIS — Z79899 Other long term (current) drug therapy: Secondary | ICD-10-CM | POA: Insufficient documentation

## 2016-03-02 DIAGNOSIS — E669 Obesity, unspecified: Secondary | ICD-10-CM | POA: Diagnosis not present

## 2016-03-02 DIAGNOSIS — Z7982 Long term (current) use of aspirin: Secondary | ICD-10-CM | POA: Insufficient documentation

## 2016-03-02 DIAGNOSIS — Z87891 Personal history of nicotine dependence: Secondary | ICD-10-CM | POA: Insufficient documentation

## 2016-03-02 DIAGNOSIS — I2510 Atherosclerotic heart disease of native coronary artery without angina pectoris: Secondary | ICD-10-CM | POA: Diagnosis not present

## 2016-03-02 DIAGNOSIS — Z8549 Personal history of malignant neoplasm of other male genital organs: Secondary | ICD-10-CM | POA: Insufficient documentation

## 2016-03-02 IMAGING — CT CT CTA ABD/PEL W/CM AND/OR W/O CM
2 of 11 series · 8 of 46 positions shown, 14 images · IV contrast (Omnipaque 300)
Comparison: Right lower extremity venous Doppler ultrasound -
03/13/2015

CLINICAL DATA: Venous insufficiency, now with right thigh a an
groin swelling for 2 weeks. History of penectomy in [DATE] with
lymph node dissection involving the bilateral groins

EXAM:
CT ANGIOGRAPHY ABDOMEN AND PELVIS WITH CONTRAST AND WITHOUT CONTRAST
TECHNIQUE: Multidetector CT imaging of the abdomen and pelvis was performed
using the standard protocol during bolus administration of
intravenous contrast. Multiplanar reconstructed images including
MIPs were obtained and reviewed to evaluate the vascular anatomy.
CONTRAST:  100mL OMNIPAQUE IOHEXOL 350 MG/ML SOLN

[Series 5: venous 5.0 b30f · axial · portal-venous · 0.94mm/px · z∈[-609,-139]mm · 7 of 126 slices shown, 12 images]
[im 16/126  soft-tissue]
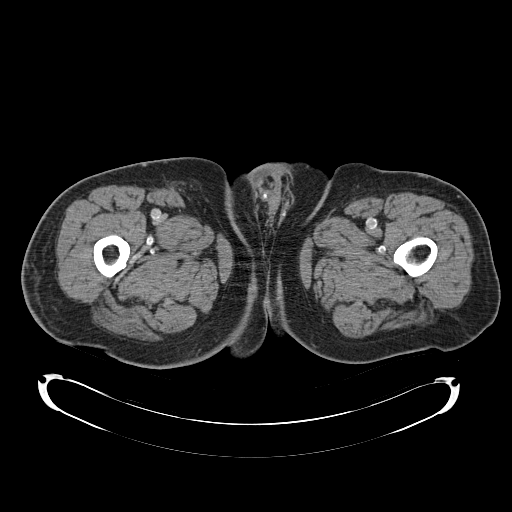
[im 16/126  bone]
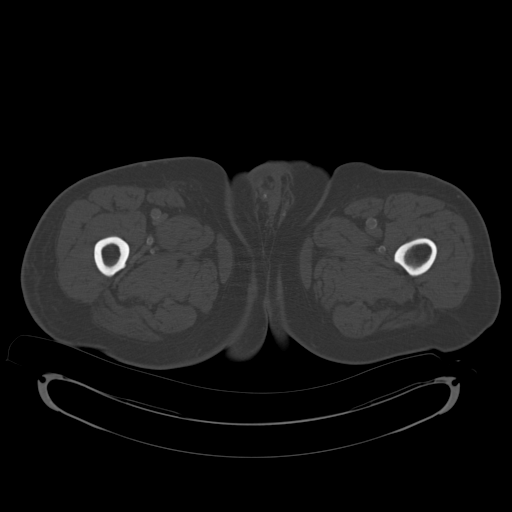
[im 32/126  soft-tissue]
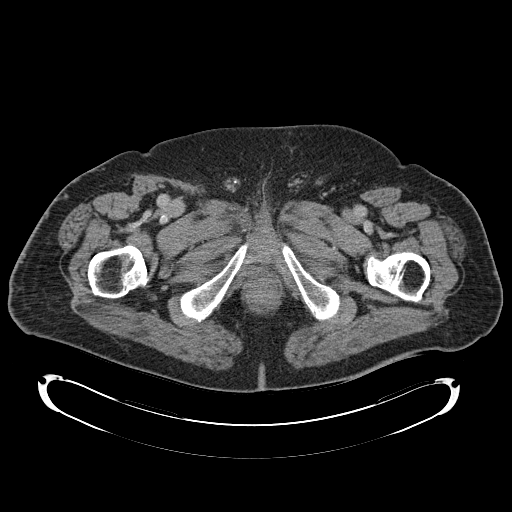
[im 47/126  soft-tissue]
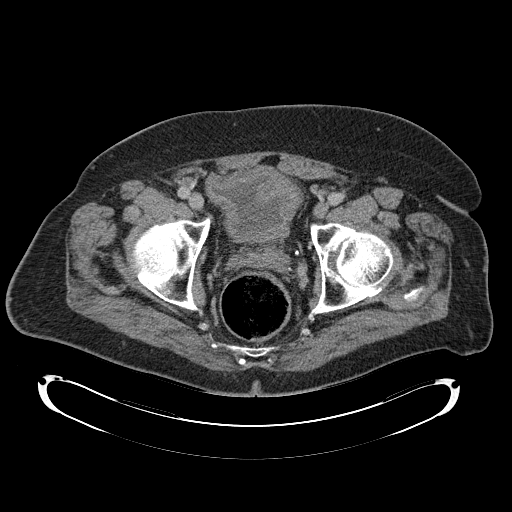
[im 63/126  soft-tissue]
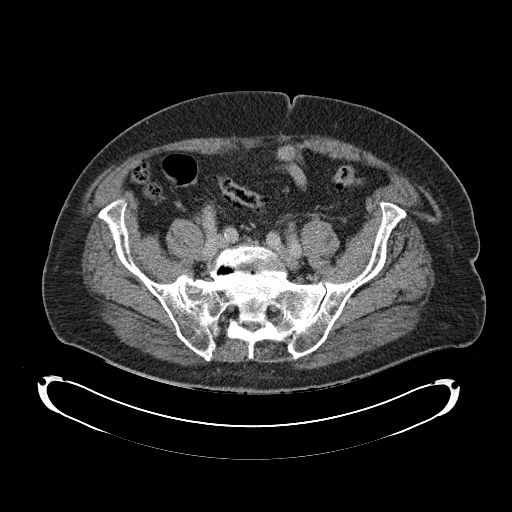
[im 63/126  lung]
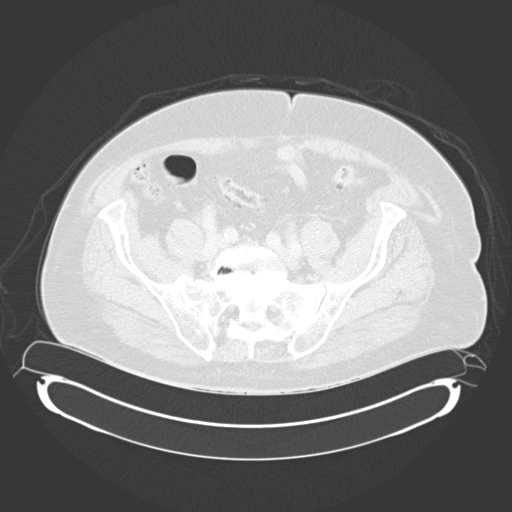
[im 79/126  soft-tissue]
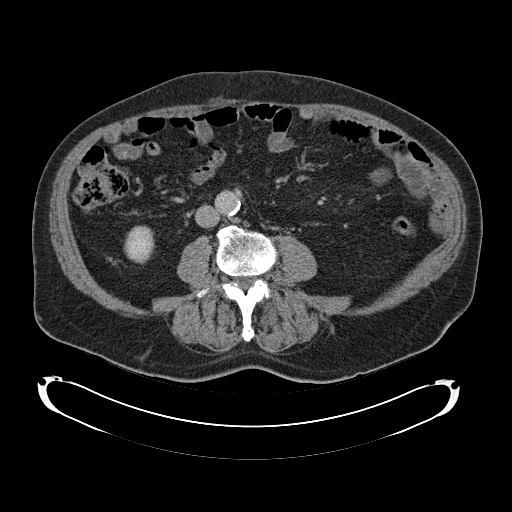
[im 79/126  lung]
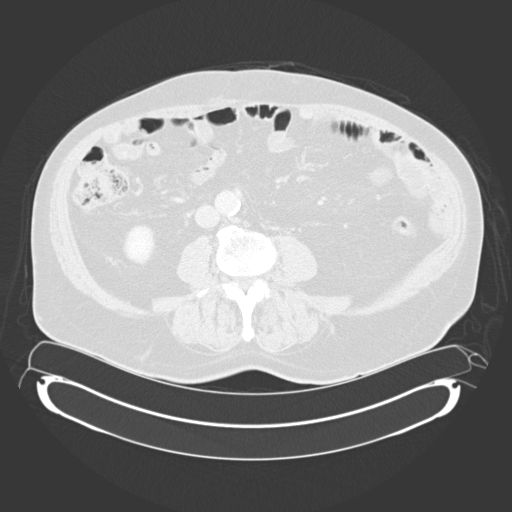
[im 94/126  soft-tissue]
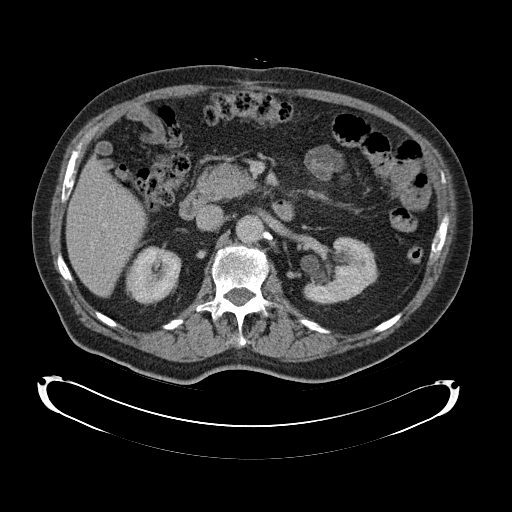
[im 94/126  lung]
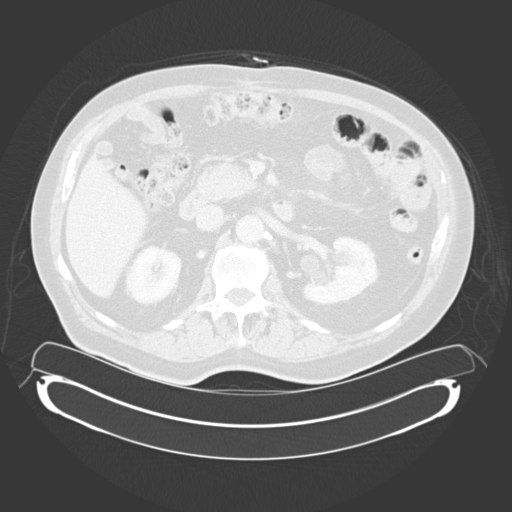
[im 110/126  soft-tissue]
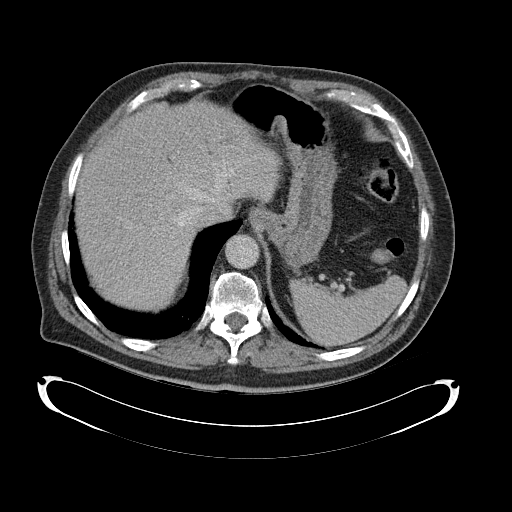
[im 110/126  lung]
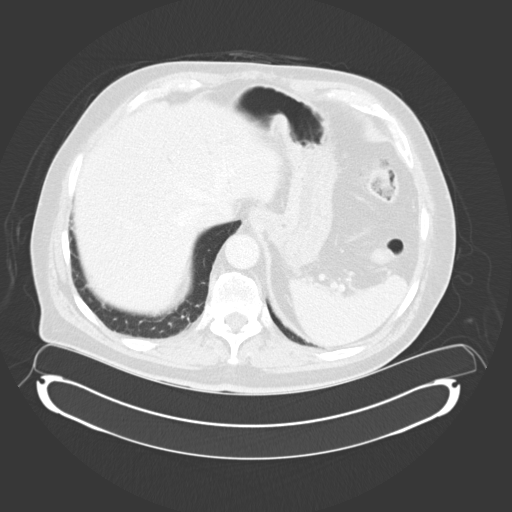

[Series 7: arterial 3.0 spo cor · coronal · arterial · 0.86mm/px · 1 of 112 slices shown, 2 images]
[im 56/112  soft-tissue]
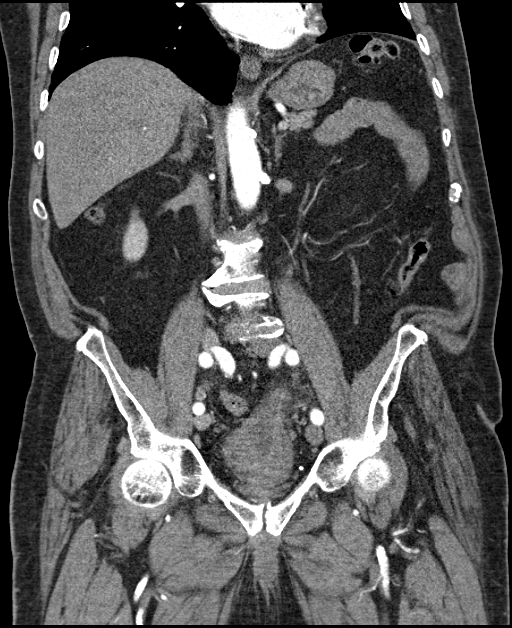
[im 56/112  bone]
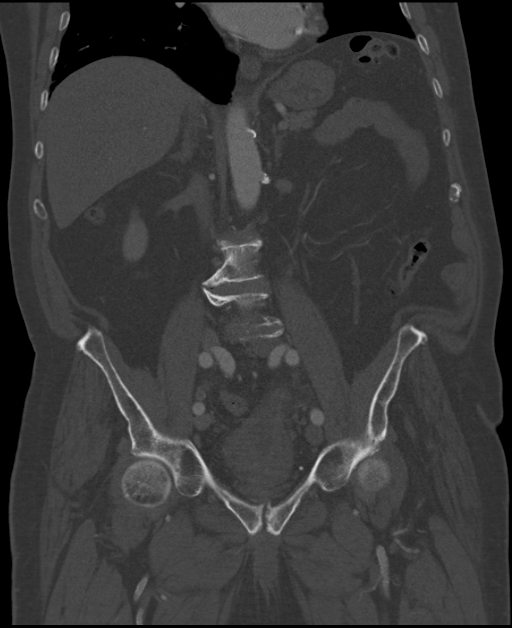

[8 of 46 positions shown; findings below may reference images not displayed]

FINDINGS: Vascular Findings:

Abdominal aorta: Minimal amount of predominantly calcified
atherosclerotic plaque within a mildly tortuous but normal caliber
abdominal aorta, not resulting in hemodynamically significant
stenosis. No abdominal aortic dissection or periaortic stranding.

Celiac artery: There is a minimal amount of eccentric calcified
plaque involving the cranial aspect of the origin of the celiac
artery, not resulting in hemodynamically significant stenosis.
Conventional branching pattern.

SMA: There is a minimal to moderate amount of eccentric calcified
plaque involving the left-sided the origin and proximal aspect of
the SMA, not definitely resulting in hemodynamically significant
stenosis. Conventional branching pattern. The distal tributaries of
the SMA are widely patent without discrete intraluminal filling
defect.

Right Renal artery: Solitary; there is eccentric mixed calcified and
noncalcified atherosclerotic plaque involving the origin of the
right renal artery which [DATE]% luminal narrowing. No
vessel irregularity to suggest FMD.

Left Renal artery: Solitary; minimal amount of eccentric calcified
plaque involves the origin and proximal aspect of the left renal
artery, not resulting in a hemodynamically significant stenosis. No
vessel irregular to suggest FMD.

IMA: Widely patent.

Right-sided pelvic vasculature: There is a minimal amount of
eccentric mixed calcified and noncalcified atherosclerotic plaque
within a mildly ectatic right common iliac artery which measures
approximately 1.7 cm in diameter. The right internal iliac artery is
mildly disease though patent and of normal caliber. The right
external iliac artery is tortuous but widely patent and without
hemodynamically significant stenosis.

Left-sided pelvic vasculature: There is a minimal amount of
eccentric mixed calcified and noncalcified atherosclerotic plaque
within a mildly ectatic left common iliac artery which measures
approximately 1.8 cm in maximal diameter. The left internal iliac
artery is mildly disease though patent of normal caliber. The left
external iliac artery is tortuous though widely patent without
hemodynamically significant stenosis.

Venous

Review of the MIP images confirms the above findings.

--------------------------------------------------------------------------------

Nonvascular Findings:

Normal hepatic contour. Post cholecystectomy. Mild dilatation of the
common bile duct with centralized intrahepatic biliary duct
dilatation, the sequela of post cholecystectomy state. No discrete
hepatic lesions. No ascites.

There is symmetric enhancement of the bilateral kidneys. Note is
made of an approximately 1.6 cm hypo attenuating (8 Hounsfield unit)
cyst within the interpolar aspect of the right kidney (42, series
5). Note is made of multiple left-sided parapelvic cysts. Additional
bilateral subcentimeter hypoattenuating lesions are too small to
adequately characterize though favored to represent renal cysts. No
definite renal stones this postcontrast examination. No urinary
obstruction or perinephric stranding. There is mild thickening of
the crux of the bilateral adrenal glands, too small to adequately
characterize. Normal appearance of the pancreas and spleen.

Extensive colonic diverticulosis without evidence of diverticulitis.
Moderate colonic stool burden without evidence of enteric
obstruction. The appendix is not visualized compatible with provided
surgical history. No pneumoperitoneum, pneumatosis or portal venous
gas.

There is nonspecific ill-defined stranding within the abdominal
mesentery (misty mesentery). Scattered shotty mesenteric lymph nodes
are individually not enlarged by size criteria with index mesenteric
lymph node measuring 0.8 cm in greatest short axis diameter (image
49, series 6). Likewise, scattered retroperitoneal, pelvic and
inguinal lymph nodes are individually not enlarged by size criteria
with index distal left periaortic lymph node measuring 0.8 cm in
greatest short axis diameter (image 89, series 6, index right
external iliac chain lymph node measuring 1.0 cm (image 114, series
6 and index left inguinal lymph node measuring 0.9 cm (image 156,
series 6).

There is mild diffuse thickening of the urinary bladder wall,
potentially accentuated due to underdistention. Post penectomy. No
free fluid in the pelvic cul-de-sac.

Limited visualization of lower thorax demonstrates minimal dependent
subpleural ground-glass atelectasis. No discrete focal airspace
opacities. No pleural effusion.

Borderline cardiomegaly. No pericardial effusion. Exuberant
calcifications within the mitral valve annulus. Coronary artery
calcifications. No pericardial effusion.

Bilateral L5 pars defects with associated minimal (approximately 6
mm) of anterolisthesis of L5 upon S1. Severe DDD of L4-L5 with disc
space height loss, endplate irregularity and sclerosis.

Regional soft tissues appear normal. Interval resolution of
previously noted fluid collection within the right groin.
IMPRESSION: Vascular Impression:

1. Normal appearance of the IVC and bilateral pelvic venous systems.
No definite explanation for patient's asymmetric right lower
extremity swelling.
2. Scattered minimal amount of atherosclerotic plaque within a
normal caliber abdominal aorta, not resulting in hemodynamically
significant stenosis.
3. Potential hemodynamically significant narrowing involving the
origin of the right renal artery without associated downstream
right-sided asymmetric renal atrophy.
Nonvascular Impression:

1. Post penectomy. Scattered mesenteric, retroperitoneal, pelvic and
left inguinal lymph nodes are individually not enlarged by size
criteria and thus presumably reactive etiology. No definite evidence
of metastatic disease to the abdomen or pelvis.
2. Interval resolution of fluid collection within the right groin
suggestive of a resolved hematoma or seroma.
3. Diffuse thickening of the urinary bladder wall, potentially
accentuated due to underdistention.
4. Colonic diverticulosis without evidence of diverticulitis.
5. Bilateral L5 pars defects with associated grade 1 anterolisthesis
of L5 upon S1.
6. Severe DDD of L4-L5.

## 2016-03-02 NOTE — ED Provider Notes (Signed)
CSN: [id number]     Arrival date & time 03/02/16  2107 History  By signing my name below, I, Nicole Kindred, attest that this documentation has been prepared under the direction and in the presence of No att. providers found.   Electronically Signed: Nicole Kindred, ED Scribe. 03/02/2016. 12:25 PM    Chief Complaint  Patient presents with  . Urinary Retention    The history is provided by the patient. No language interpreter was used.   HPI Comments: Caleb [name] is a 82 y.o. male with PSHx of lymph node removal five days ago who presents to the Emergency Department complaining of gradual onset, constant urinary retention. No worsening or alleviating factors noted. Pt denies any other pertinent symptoms. Pt reports that he uses a catheter at home but could no draw any urine. Pt is going back to [facility] for follow up in four days.   Past Medical History  Diagnosis Date  . Hyperlipidemia   . Coronary artery disease   . Obesity   . Hyperlipidemia   . Swelling of extremity     Left Leg  . Penile cancer [facility])    Past Surgical History  Procedure Laterality Date  . Appendectomy    . Cholecystectomy    . Coronary artery bypass graft    . Hiatal hernia repair    . Back surgery    . Lymph node removal    . Coronary angioplasty  11/2005    RCA PCI AND STENTING WITH A CYPHER DES  . Transthoracic echocardiogram  12/22/2005    MILD TO MOD AORTIC SCLEROSIS W/O STENOSIS. MILD TO MODERATE MITRAL CALCIFICATION. LA- MILDLY DILATED.  Marland Kitchen Nm myocar multiple w/spect  11/26/2009    EF 62%. NORMAL MYOCARDIAL PERFUSION STUDY.   Family History  Problem Relation Age of Onset  . Cancer    . Cancer Father   . Cancer Sister   . Cancer Brother   . Cancer Sister    Social History  Substance Use Topics  . Smoking status: Former Smoker    Types: Cigarettes    Quit date: 07/14/1983  . Smokeless tobacco: Never Used  . Alcohol Use: No    Review of Systems  Genitourinary: Positive for  difficulty urinating.  Psychiatric/Behavioral: Negative for confusion.  All other systems reviewed and are negative.   Allergies  Review of patient's allergies indicates no known allergies.  Home Medications   Prior to Admission medications   Medication Sig Start Date End Date Taking? Authorizing Provider  acetaminophen (TYLENOL) 325 MG tablet Take 1-2 tablets (325-650 mg total) by mouth every 6 (six) hours as needed for fever, headache, mild pain or moderate pain. 01/14/16  Yes Nat Christen, PA-C  Ascorbic Acid (VITAMIN C) 1000 MG tablet Take 1,000 mg by mouth daily.    Yes Historical Provider, MD  aspirin EC 81 MG tablet Take 81 mg by mouth daily.   Yes Historical Provider, MD  Calcium Carbonate-Vitamin D (CALCIUM-D PO) Take 1 tablet by mouth daily.   Yes Historical Provider, MD  clopidogrel (PLAVIX) 75 MG tablet Take 1 tablet by mouth  daily 07/24/15  Yes Lorretta Harp, MD  metoprolol tartrate (LOPRESSOR) 25 MG tablet Take 1 tablet by mouth  twice a day 07/24/15  Yes Lorretta Harp, MD  Multiple Vitamin (MULTIVITAMIN WITH MINERALS) TABS tablet Take 1 tablet by mouth daily. Centrum Silver   Yes Historical Provider, MD  Nystatin [facility]) 100000 UNIT/GM POWD apply to affected area twice  a day 02/24/16  Yes Historical Provider, MD  oxybutynin (DITROPAN) 5 MG tablet Take 5 mg by mouth 2 (two) times daily.  02/29/16 02/28/17 Yes Historical Provider, MD  oxyCODONE (OXY IR/ROXICODONE) 5 MG immediate release tablet Take 2.5-5 mg by mouth every 4 (four) hours as needed for moderate pain or severe pain.  02/29/16 03/10/16 Yes Historical Provider, MD  polyethylene glycol powder (GLYCOLAX/MIRALAX) powder Take 17 g by mouth daily as needed for mild constipation or moderate constipation.   Yes Historical Provider, MD  simvastatin (ZOCOR) 20 MG tablet Take 1 tablet by mouth  every night at bedtime 07/24/15  Yes Lorretta Harp, MD  cephALEXin (KEFLEX) 500 MG capsule Take 1 capsule (500 mg total) by mouth 3  (three) times daily. Patient not taking: Reported on 02/29/2016 02/17/16   Ezequiel Essex, MD  nitrofurantoin, macrocrystal-monohydrate, (MACROBID) 100 MG capsule Take 1 capsule (100 mg total) by mouth 2 (two) times daily. 03/03/16   Orpah Greek, MD  traMADol (ULTRAM) 50 MG tablet Take 1 tablet (50 mg total) by mouth every 6 (six) hours as needed for moderate pain. Patient not taking: Reported on 02/17/2016 01/14/16   Nat Christen, PA-C   BP 136/73 mmHg  Pulse 85  Temp(Src) 98.2 F (36.8 C) (Oral)  Resp 20  Ht 6' (1.829 m)  Wt 240 lb (108.863 kg)  BMI 32.54 kg/m2  SpO2 96% Physical Exam  Constitutional: He is oriented to person, place, and time. He appears well-developed and well-nourished. No distress.  HENT:  Head: Normocephalic and atraumatic.  Right Ear: Hearing normal.  Left Ear: Hearing normal.  Nose: Nose normal.  Mouth/Throat: Oropharynx is clear and moist and mucous membranes are normal.  Eyes: Conjunctivae and EOM are normal. Pupils are equal, round, and reactive to light.  Neck: Normal range of motion. Neck supple.  Cardiovascular: Regular rhythm, S1 normal and S2 normal.  Exam reveals no gallop and no friction rub.   No murmur heard. Pulmonary/Chest: Effort normal and breath sounds normal. No respiratory distress. He exhibits no tenderness.  Abdominal: Soft. Normal appearance and bowel sounds are normal. There is no hepatosplenomegaly. There is no tenderness. There is no rebound, no guarding, no tenderness at McBurney's point and negative Murphy's sign. No hernia.  Genitourinary:  JP drain in left groin.  5-10 mL of pink, clear fluid.  Sutures intact without drainage or redness.   Musculoskeletal: Normal range of motion.  Neurological: He is alert and oriented to person, place, and time. He has normal strength. No cranial nerve deficit or sensory deficit. Coordination normal. GCS eye subscore is 4. GCS verbal subscore is 5. GCS motor subscore is 6.  Skin: Skin is  warm, dry and intact. No rash noted. No cyanosis.  Psychiatric: He has a normal mood and affect. His speech is normal and behavior is normal. Thought content normal.  Nursing note and vitals reviewed.   ED Course  Procedures (including critical care time) DIAGNOSTIC STUDIES: Oxygen Saturation is 96% on RA, adequate by my interpretation.    COORDINATION OF CARE: 11:16 PM-Discussed treatment plan which includes BMP and catheter insertion with pt at bedside and pt agreed to plan.   Labs Review Labs Reviewed  BASIC METABOLIC PANEL - Abnormal; Notable for the following:    BUN 34 (*)    Creatinine, Ser 1.29 (*)    GFR calc non Af Amer 47 (*)    GFR calc Af Amer 55 (*)    All other components within normal  limits  URINALYSIS, ROUTINE W REFLEX MICROSCOPIC (NOT AT [facility]) - Abnormal; Notable for the following:    Color, Urine BROWN (*)    APPearance CLOUDY (*)    Hgb urine dipstick LARGE (*)    Bilirubin Urine MODERATE (*)    Ketones, ur TRACE (*)    Protein, ur >300 (*)    Nitrite POSITIVE (*)    Leukocytes, UA TRACE (*)    All other components within normal limits  URINE MICROSCOPIC-ADD ON - Abnormal; Notable for the following:    Squamous Epithelial / LPF 0-5 (*)    Bacteria, UA MANY (*)    All other components within normal limits  URINE CULTURE    Imaging Review No results found. I have personally reviewed and evaluated these lab results as part of my medical decision-making.   EKG Interpretation None      MDM   Final diagnoses:  Urinary retention  UTI (lower urinary tract infection)   Patient presents to the ER for evaluation of urinary retention. Patient had lymph node surgery performed several days ago. He had a Foley catheter in until today. Patient has a history of needing to self catheterize himself for several years. After he took the Foley catheter out today, however, he was unable to insert his catheter to drain his bladder. A Foley catheter was placed today  without difficulty and has been drained. Urinalysis suggestive of possible infection. Will initiate antibiotic coverage and discharged with Place, follow up with his specialist at [facility].  I personally performed the services described in this documentation, which was scribed in my presence. The recorded information has been reviewed and is accurate.      Orpah Greek, MD 03/03/16 [phone]

## 2016-03-02 NOTE — ED Notes (Signed)
Pt has had catheter removed and at this time is unable to urinate or self cath.

## 2016-03-03 DIAGNOSIS — N390 Urinary tract infection, site not specified: Secondary | ICD-10-CM | POA: Diagnosis not present

## 2016-03-03 LAB — BASIC METABOLIC PANEL
Anion gap: 8 (ref 5–15)
BUN: 34 mg/dL — AB (ref 6–20)
CO2: 25 mmol/L (ref 22–32)
CREATININE: 1.29 mg/dL — AB (ref 0.61–1.24)
Calcium: 9.7 mg/dL (ref 8.9–10.3)
Chloride: 109 mmol/L (ref 101–111)
GFR calc Af Amer: 55 mL/min — ABNORMAL LOW (ref >60)
GFR, EST NON AFRICAN AMERICAN: 47 mL/min — AB (ref >60)
Glucose, Bld: 99 mg/dL (ref 65–99)
POTASSIUM: 4.7 mmol/L (ref 3.5–5.1)
SODIUM: 142 mmol/L (ref 135–145)

## 2016-03-03 LAB — URINALYSIS, ROUTINE W REFLEX MICROSCOPIC
Glucose, UA: NEGATIVE mg/dL
Nitrite: POSITIVE — AB
Specific Gravity, Urine: 1.025 (ref 1.005–1.030)
pH: 6.5 (ref 5.0–8.0)

## 2016-03-03 LAB — URINE MICROSCOPIC-ADD ON

## 2016-03-03 MED ORDER — NITROFURANTOIN MONOHYD MACRO 100 MG PO CAPS: 100.0000 mg | ORAL_CAPSULE | Freq: Two times a day (BID) | ORAL | Status: DC

## 2016-03-03 MED ORDER — NITROFURANTOIN MONOHYD MACRO 100 MG PO CAPS
100.0000 mg | ORAL_CAPSULE | Freq: Once | ORAL | Status: AC
  Administered 2016-03-03: 100 mg via ORAL
  Filled 2016-03-03: qty 1

## 2016-03-03 NOTE — Discharge Instructions (Signed)
Acute Urinary Retention, Male Acute urinary retention is the temporary inability to urinate. This is a common problem in older men. As men age their prostates become larger and block the flow of urine from the bladder. This is usually a problem that has come on gradually.  HOME CARE INSTRUCTIONS If you are sent home with a Foley catheter and a drainage system, you will need to discuss the best course of action with your health care provider. While the catheter is in, maintain a good intake of fluids. Keep the drainage bag emptied and lower than your catheter. This is so that contaminated urine will not flow back into your bladder, which could lead to a urinary tract infection. There are two main types of drainage bags. One is a large bag that usually is used at night. It has a good capacity that will allow you to sleep through the night without having to empty it. The second type is called a leg bag. It has a smaller capacity, so it needs to be emptied more frequently. However, the main advantage is that it can be attached by a leg strap and can go underneath your clothing, allowing you the freedom to move about or leave your home. Only take over-the-counter or prescription medicines for pain, discomfort, or fever as directed by your health care provider.  SEEK MEDICAL CARE IF:  You develop a low-grade fever.  You experience spasms or leakage of urine with the spasms. SEEK IMMEDIATE MEDICAL CARE IF:   You develop chills or fever.  Your catheter stops draining urine.  Your catheter falls out.  You start to develop increased bleeding that does not respond to rest and increased fluid intake. MAKE SURE YOU:  Understand these instructions.  Will watch your condition.  Will get help right away if you are not doing well or get worse.   This information is not intended to replace advice given to you by your health care provider. Make sure you discuss any questions you have with your health care  provider.   Document Released: 03/22/2001 Document Revised: 04/30/2015 Document Reviewed: 05/25/2013 Elsevier Interactive Patient Education 2016 Elsevier Inc.  Urinary Tract Infection A urinary tract infection (UTI) can occur any place along the urinary tract. The tract includes the kidneys, ureters, bladder, and urethra. A type of germ called bacteria often causes a UTI. UTIs are often helped with antibiotic medicine.  HOME CARE   If given, take antibiotics as told by your doctor. Finish them even if you start to feel better.  Drink enough fluids to keep your pee (urine) clear or pale yellow.  Avoid tea, drinks with caffeine, and bubbly (carbonated) drinks.  Pee often. Avoid holding your pee in for a long time.  Pee before and after having sex (intercourse).  Wipe from front to back after you poop (bowel movement) if you are a woman. Use each tissue only once. GET HELP RIGHT AWAY IF:   You have back pain.  You have lower belly (abdominal) pain.  You have chills.  You feel sick to your stomach (nauseous).  You throw up (vomit).  Your burning or discomfort with peeing does not go away.  You have a fever.  Your symptoms are not better in 3 days. MAKE SURE YOU:   Understand these instructions.  Will watch your condition.  Will get help right away if you are not doing well or get worse.   This information is not intended to replace advice given to you by  your health care provider. Make sure you discuss any questions you have with your health care provider.   Document Released: 06/01/2008 Document Revised: 01/04/2015 Document Reviewed: 07/14/2012 Elsevier Interactive Patient Education [employer].

## 2016-03-03 NOTE — ED Notes (Signed)
Pt alert & oriented x4, stable gait. Patient given discharge instructions, paperwork & prescription(s). Patient instructed to stop at the registration desk to finish any additional paperwork. Patient verbalized understanding. Pt left department by wheelchair escorted by staff. Pt left department w/ no further questions.

## 2016-03-04 ENCOUNTER — Encounter (HOSPITAL_COMMUNITY): Payer: Self-pay | Admitting: Emergency Medicine

## 2016-03-04 ENCOUNTER — Emergency Department (HOSPITAL_COMMUNITY)
Admission: EM | Admit: 2016-03-04 | Discharge: 2016-03-04 | Disposition: A | Discharge status: Home / Self Care | Payer: Medicare Other | Attending: Emergency Medicine | Admitting: Emergency Medicine

## 2016-03-04 ENCOUNTER — Emergency Department (HOSPITAL_COMMUNITY)
Admission: EM | Admit: 2016-03-04 | Discharge: 2016-03-04 | Disposition: A | Discharge status: Home / Self Care | Payer: Medicare Other

## 2016-03-04 DIAGNOSIS — Z9049 Acquired absence of other specified parts of digestive tract: Secondary | ICD-10-CM | POA: Diagnosis not present

## 2016-03-04 DIAGNOSIS — I2510 Atherosclerotic heart disease of native coronary artery without angina pectoris: Secondary | ICD-10-CM | POA: Diagnosis not present

## 2016-03-04 DIAGNOSIS — Z79899 Other long term (current) drug therapy: Secondary | ICD-10-CM | POA: Insufficient documentation

## 2016-03-04 DIAGNOSIS — E669 Obesity, unspecified: Secondary | ICD-10-CM | POA: Diagnosis not present

## 2016-03-04 DIAGNOSIS — Z8549 Personal history of malignant neoplasm of other male genital organs: Secondary | ICD-10-CM | POA: Insufficient documentation

## 2016-03-04 DIAGNOSIS — R339 Retention of urine, unspecified: Secondary | ICD-10-CM

## 2016-03-04 DIAGNOSIS — E785 Hyperlipidemia, unspecified: Secondary | ICD-10-CM | POA: Insufficient documentation

## 2016-03-04 DIAGNOSIS — Z87891 Personal history of nicotine dependence: Secondary | ICD-10-CM | POA: Diagnosis not present

## 2016-03-04 NOTE — ED Notes (Signed)
Pt states he switched from his leg bag to regular drainage bag and foley stopped draining. Flushes easily with no return at triage.

## 2016-03-04 NOTE — ED Provider Notes (Signed)
CSN: [id number]     Arrival date & time 03/04/16  1048 History   First MD Initiated Contact with Patient 03/04/16 1205     Chief Complaint  Patient presents with  . Urinary Retention     (Consider location/radiation/quality/duration/timing/severity/associated sxs/prior Treatment) Patient is a 82 y.o. male presenting with frequency.  Urinary Frequency This is a recurrent problem. The problem occurs constantly. The problem has been rapidly worsening. Pertinent negatives include no chest pain, no abdominal pain, no headaches and no shortness of breath. Nothing aggravates the symptoms. Nothing relieves the symptoms. He has tried nothing for the symptoms. The treatment provided no relief.    Past Medical History  Diagnosis Date  . Hyperlipidemia   . Coronary artery disease   . Obesity   . Hyperlipidemia   . Swelling of extremity     Left Leg  . Penile cancer [facility])    Past Surgical History  Procedure Laterality Date  . Appendectomy    . Cholecystectomy    . Coronary artery bypass graft    . Hiatal hernia repair    . Back surgery    . Lymph node removal    . Coronary angioplasty  11/2005    RCA PCI AND STENTING WITH A CYPHER DES  . Transthoracic echocardiogram  12/22/2005    MILD TO MOD AORTIC SCLEROSIS W/O STENOSIS. MILD TO MODERATE MITRAL CALCIFICATION. LA- MILDLY DILATED.  Marland Kitchen Nm myocar multiple w/spect  11/26/2009    EF 62%. NORMAL MYOCARDIAL PERFUSION STUDY.   Family History  Problem Relation Age of Onset  . Cancer    . Cancer Father   . Cancer Sister   . Cancer Brother   . Cancer Sister    Social History  Substance Use Topics  . Smoking status: Former Smoker    Types: Cigarettes    Quit date: 07/14/1983  . Smokeless tobacco: Never Used  . Alcohol Use: No    Review of Systems  Constitutional: Negative for fever and chills.  Eyes: Negative for pain and redness.  Respiratory: Negative for shortness of breath.   Cardiovascular: Negative for chest pain.   Gastrointestinal: Negative for abdominal pain.  Endocrine: Negative for polydipsia and polyuria.  Genitourinary: Positive for frequency and difficulty urinating. Negative for dysuria, flank pain and enuresis.  Neurological: Negative for headaches.  All other systems reviewed and are negative.     Allergies  Review of patient's allergies indicates no known allergies.  Home Medications   Prior to Admission medications   Medication Sig Start Date End Date Taking? Authorizing Provider  acetaminophen (TYLENOL) 325 MG tablet Take 1-2 tablets (325-650 mg total) by mouth every 6 (six) hours as needed for fever, headache, mild pain or moderate pain. 01/14/16   Nat Christen, PA-C  Ascorbic Acid (VITAMIN C) 1000 MG tablet Take 1,000 mg by mouth daily.     Historical Provider, MD  aspirin EC 81 MG tablet Take 81 mg by mouth daily.    Historical Provider, MD  Calcium Carbonate-Vitamin D (CALCIUM-D PO) Take 1 tablet by mouth daily.    Historical Provider, MD  cephALEXin (KEFLEX) 500 MG capsule Take 1 capsule (500 mg total) by mouth 3 (three) times daily. Patient not taking: Reported on 02/29/2016 02/17/16   Ezequiel Essex, MD  clopidogrel (PLAVIX) 75 MG tablet Take 1 tablet by mouth  daily 07/24/15   Lorretta Harp, MD  metoprolol tartrate (LOPRESSOR) 25 MG tablet Take 1 tablet by mouth  twice a day 07/24/15  Lorretta Harp, MD  Multiple Vitamin (MULTIVITAMIN WITH MINERALS) TABS tablet Take 1 tablet by mouth daily. Centrum Silver    Historical Provider, MD  nitrofurantoin, macrocrystal-monohydrate, (MACROBID) 100 MG capsule Take 1 capsule (100 mg total) by mouth 2 (two) times daily. 03/03/16   Orpah Greek, MD  Nystatin [facility]) 100000 UNIT/GM POWD apply to affected area twice a day 02/24/16   Historical Provider, MD  oxybutynin (DITROPAN) 5 MG tablet Take 5 mg by mouth 2 (two) times daily.  02/29/16 02/28/17  Historical Provider, MD  oxyCODONE (OXY IR/ROXICODONE) 5 MG immediate release tablet  Take 2.5-5 mg by mouth every 4 (four) hours as needed for moderate pain or severe pain.  02/29/16 03/10/16  Historical Provider, MD  polyethylene glycol powder (GLYCOLAX/MIRALAX) powder Take 17 g by mouth daily as needed for mild constipation or moderate constipation.    Historical Provider, MD  simvastatin (ZOCOR) 20 MG tablet Take 1 tablet by mouth  every night at bedtime 07/24/15   Lorretta Harp, MD  traMADol (ULTRAM) 50 MG tablet Take 1 tablet (50 mg total) by mouth every 6 (six) hours as needed for moderate pain. Patient not taking: Reported on 02/17/2016 01/14/16   Nat Christen, PA-C   BP 129/68 mmHg  Pulse 83  Temp(Src) 98.2 F (36.8 C) (Oral)  Resp 18  Ht 6\' 2"  (1.88 m)  Wt 240 lb (108.863 kg)  BMI 30.80 kg/m2  SpO2 97% Physical Exam  Constitutional: He is oriented to person, place, and time. He appears well-developed and well-nourished.  HENT:  Head: Normocephalic and atraumatic.  Neck: Normal range of motion.  Cardiovascular: Normal rate and regular rhythm.   Pulmonary/Chest: Effort normal. No respiratory distress.  Abdominal: Soft. Bowel sounds are normal. He exhibits no distension. There is no tenderness. There is no rebound.  Musculoskeletal: Normal range of motion.  Neurological: He is alert and oriented to person, place, and time. No cranial nerve deficit. Coordination normal.  Skin: Skin is warm and dry. No rash noted.  Nursing note and vitals reviewed.   ED Course  Procedures (including critical care time) Labs Review Labs Reviewed - No data to display  Imaging Review No results found. I have personally reviewed and evaluated these images and lab results as part of my medical decision-making.   EKG Interpretation None      MDM   Final diagnoses:  Urinary retention   Minimal catheter drainage since last night. No abdominal or flank pain. Doubt obstructive nephropathy 2/2 short duration of symptoms.   Catheter flushed, 800cc output. Still no abdominal  pain. Will continue to follow with specialist at [facility].     Merrily Pew, MD 03/04/16 1420

## 2016-03-04 NOTE — ED Notes (Signed)
Catheter flushed with 60 mL of sterile water. Catheter patent and flushing.

## 2016-03-04 NOTE — ED Notes (Signed)
Pt states he had surgery Thursday on his bladder and has had a foley since then.  Has been having bleeding that keeps clogging his catheter.  Was here last night to have it flushed as well.  Denies any complaints and states bleeding is getting better.

## 2016-03-04 NOTE — ED Notes (Signed)
Pt was in waiting area and foley began draining.  Pt decided to go home.

## 2016-03-05 LAB — URINE CULTURE: Culture: >=100000

## 2016-03-06 ENCOUNTER — Emergency Department (HOSPITAL_COMMUNITY)
Admission: EM | Admit: 2016-03-06 | Discharge: 2016-03-06 | Disposition: A | Discharge status: Home / Self Care | Payer: Medicare Other | Attending: Emergency Medicine | Admitting: Emergency Medicine

## 2016-03-06 ENCOUNTER — Encounter (HOSPITAL_COMMUNITY): Payer: Self-pay | Admitting: Emergency Medicine

## 2016-03-06 ENCOUNTER — Telehealth: Payer: Self-pay | Admitting: *Deleted

## 2016-03-06 DIAGNOSIS — I2510 Atherosclerotic heart disease of native coronary artery without angina pectoris: Secondary | ICD-10-CM | POA: Diagnosis not present

## 2016-03-06 DIAGNOSIS — Z87891 Personal history of nicotine dependence: Secondary | ICD-10-CM | POA: Diagnosis not present

## 2016-03-06 DIAGNOSIS — Z8549 Personal history of malignant neoplasm of other male genital organs: Secondary | ICD-10-CM | POA: Diagnosis not present

## 2016-03-06 DIAGNOSIS — N390 Urinary tract infection, site not specified: Secondary | ICD-10-CM

## 2016-03-06 DIAGNOSIS — E669 Obesity, unspecified: Secondary | ICD-10-CM | POA: Insufficient documentation

## 2016-03-06 DIAGNOSIS — R339 Retention of urine, unspecified: Secondary | ICD-10-CM | POA: Diagnosis present

## 2016-03-06 DIAGNOSIS — Z7982 Long term (current) use of aspirin: Secondary | ICD-10-CM | POA: Diagnosis not present

## 2016-03-06 DIAGNOSIS — E785 Hyperlipidemia, unspecified: Secondary | ICD-10-CM | POA: Diagnosis not present

## 2016-03-06 DIAGNOSIS — Z9079 Acquired absence of other genital organ(s): Secondary | ICD-10-CM | POA: Insufficient documentation

## 2016-03-06 MED ORDER — LIDOCAINE HCL (PF) 1 % IJ SOLN
INTRAMUSCULAR | Status: AC
  Filled 2016-03-06: qty 5

## 2016-03-06 MED ORDER — CEFTRIAXONE SODIUM 1 G IJ SOLR
1.0000 g | Freq: Once | INTRAMUSCULAR | Status: AC
  Administered 2016-03-06: 1 g via INTRAMUSCULAR
  Filled 2016-03-06: qty 10

## 2016-03-06 NOTE — ED Notes (Signed)
Urine continues to drain light pink tinged for a total ~750 final amount emptied from foley bag.   Extensive teaching done regarding medication regimen. Times and dosages written out for pt. Dr. Reather Converse spoke with pt's son on phone updating him with importance of following up with surgeon. Suprapubic JP drain dressing replaced after initial one almost off.   Pt assisted to car via wheelchair. Denies need for assistance driving home. Very appreciative at time of discharge.

## 2016-03-06 NOTE — Discharge Instructions (Signed)
It is very important feet of follow-up with your surgeon early this week for reassessment. Take antibiotics as discussed. If you were given medicines take as directed.  If you are on coumadin or contraceptives realize their levels and effectiveness is altered by many different medicines.  If you have any reaction (rash, tongues swelling, other) to the medicines stop taking and see a physician.    If your blood pressure was elevated in the ER make sure you follow up for management with a primary doctor or return for chest pain, shortness of breath or stroke symptoms.  Please follow up as directed and return to the ER or see a physician for new or worsening symptoms.  Thank you. Filed Vitals:   03/06/16 1815 03/06/16 2053  BP: 153/69 142/78  Pulse: 84 76  Temp: 98.8 F (37.1 C)   Resp: 18 16  Height: 6' (1.829 m)   Weight: 240 lb (108.863 kg)   SpO2: 97% 99%

## 2016-03-06 NOTE — ED Notes (Signed)
(+)  urine culture, treated with Nitrofurantoin, OK per Governor Specking, Pharm

## 2016-03-06 NOTE — ED Notes (Signed)
Bladder scan ~700 ml on 3 attempts.    Foley irrigated with 30 ml sterile water. ~ 600 ml dark urine expelled. Pt felt relief immediately. States he has not been taking his antibiotics regularly from confusion on the times.

## 2016-03-06 NOTE — ED Notes (Signed)
Pt states his foley catheter has not been draining since this am.

## 2016-03-06 NOTE — ED Provider Notes (Addendum)
CSN: [id number]     Arrival date & time 03/06/16  1740 History  By signing my name below, I, Dora Sims, attest that this documentation has been prepared under the direction and in the presence of physician practitioner, Elnora Morrison, MD. Electronically Signed: Dora Sims, Scribe. 03/06/2016. 8:50 PM.     Chief Complaint  Patient presents with  . Urinary Retention    The history is provided by the patient. No language interpreter was used.     HPI Comments: Caleb [name] is a 82 y.o. male with h/o penile cancer, penectomy, and HLD who presents to the Emergency Department complaining of sudden onset, intermittent urinary retention for the last few days. He notes that his foley catheter has not been draining since this morning. He states that he feels like his bladder is full and endorses pressure with palpation to his lower abdomen. Pt notes that his catheter worked last night and this morning, but did not work the rest of the day. He notes that he has had similar problems for the last three or four days; he has been to the ER two nights ago and had a new catheter placed which he states was working before he left. He also came to the ER last night, and was in the waiting room when is foley began draining so he went home. Pt attempted to place a catheter himself at his home two nights ago; he notes that he tried four different catheters and none of them worked. Pt notes experiencing blood drainage recently. Pt states that his bladder currently feels full. Pt has been drinking fluids normally. Pt notes that he takes Plavix for HLD. Pt notes that his penectomy occurred months ago and he had a recent surgical procedure for cancer performed on March 2nd. Pt denies fever, chills, dysuria, abdominal pain, or any other associated symptoms. Pt sees specialists at [facility] and had his penectomy performed at [facility].   Past Medical History  Diagnosis Date  . Hyperlipidemia   . Coronary artery disease   .  Obesity   . Hyperlipidemia   . Swelling of extremity     Left Leg  . Penile cancer [facility])    Past Surgical History  Procedure Laterality Date  . Appendectomy    . Cholecystectomy    . Coronary artery bypass graft    . Hiatal hernia repair    . Back surgery    . Lymph node removal    . Coronary angioplasty  11/2005    RCA PCI AND STENTING WITH A CYPHER DES  . Transthoracic echocardiogram  12/22/2005    MILD TO MOD AORTIC SCLEROSIS W/O STENOSIS. MILD TO MODERATE MITRAL CALCIFICATION. LA- MILDLY DILATED.  Marland Kitchen Nm myocar multiple w/spect  11/26/2009    EF 62%. NORMAL MYOCARDIAL PERFUSION STUDY.   Family History  Problem Relation Age of Onset  . Cancer    . Cancer Father   . Cancer Sister   . Cancer Brother   . Cancer Sister    Social History  Substance Use Topics  . Smoking status: Former Smoker    Types: Cigarettes    Quit date: 07/14/1983  . Smokeless tobacco: Never Used  . Alcohol Use: No    Review of Systems  Constitutional: Negative for fever and chills.  Gastrointestinal: Negative for abdominal pain.  Genitourinary: Positive for frequency and difficulty urinating. Negative for dysuria and penile pain.       Positive for urinary retention  All other systems reviewed and  are negative.     Allergies  Review of patient's allergies indicates no known allergies.  Home Medications   Prior to Admission medications   Medication Sig Start Date End Date Taking? Authorizing Provider  acetaminophen (TYLENOL) 325 MG tablet Take 1-2 tablets (325-650 mg total) by mouth every 6 (six) hours as needed for fever, headache, mild pain or moderate pain. 01/14/16   Nat Christen, PA-C  Ascorbic Acid (VITAMIN C) 1000 MG tablet Take 1,000 mg by mouth daily.     Historical Provider, MD  aspirin EC 81 MG tablet Take 81 mg by mouth daily.    Historical Provider, MD  Calcium Carbonate-Vitamin D (CALCIUM-D PO) Take 1 tablet by mouth daily.    Historical Provider, MD  cephALEXin (KEFLEX) 500  MG capsule Take 1 capsule (500 mg total) by mouth 3 (three) times daily. Patient not taking: Reported on 02/29/2016 02/17/16   Ezequiel Essex, MD  clopidogrel (PLAVIX) 75 MG tablet Take 1 tablet by mouth  daily 07/24/15   Lorretta Harp, MD  metoprolol tartrate (LOPRESSOR) 25 MG tablet Take 1 tablet by mouth  twice a day 07/24/15   Lorretta Harp, MD  Multiple Vitamin (MULTIVITAMIN WITH MINERALS) TABS tablet Take 1 tablet by mouth daily. Centrum Silver    Historical Provider, MD  nitrofurantoin, macrocrystal-monohydrate, (MACROBID) 100 MG capsule Take 1 capsule (100 mg total) by mouth 2 (two) times daily. 03/03/16   Orpah Greek, MD  Nystatin [facility]) 100000 UNIT/GM POWD apply to affected area twice a day 02/24/16   Historical Provider, MD  oxybutynin (DITROPAN) 5 MG tablet Take 5 mg by mouth 2 (two) times daily.  02/29/16 02/28/17  Historical Provider, MD  oxyCODONE (OXY IR/ROXICODONE) 5 MG immediate release tablet Take 2.5-5 mg by mouth every 4 (four) hours as needed for moderate pain or severe pain.  02/29/16 03/10/16  Historical Provider, MD  polyethylene glycol powder (GLYCOLAX/MIRALAX) powder Take 17 g by mouth daily as needed for mild constipation or moderate constipation.    Historical Provider, MD  simvastatin (ZOCOR) 20 MG tablet Take 1 tablet by mouth  every night at bedtime 07/24/15   Lorretta Harp, MD  traMADol (ULTRAM) 50 MG tablet Take 1 tablet (50 mg total) by mouth every 6 (six) hours as needed for moderate pain. Patient not taking: Reported on 02/17/2016 01/14/16   Nat Christen, PA-C   BP 153/69 mmHg  Pulse 84  Temp(Src) 98.8 F (37.1 C)  Resp 18  Ht 6' (1.829 m)  Wt 240 lb (108.863 kg)  BMI 32.54 kg/m2  SpO2 97% Physical Exam  Constitutional: He is oriented to person, place, and time. He appears well-developed and well-nourished. No distress.  HENT:  Head: Normocephalic and atraumatic.  Eyes: Conjunctivae and EOM are normal.  Neck: Neck supple. No tracheal deviation  present.  Cardiovascular: Normal rate.   Pulmonary/Chest: Effort normal. No respiratory distress.  Abdominal:  No pain, no peritonitis  Genitourinary:  Has a drain attached to bulb coming from the left superpubic region, stitches in place, no significant erythema around the entrance point Patient has approximately 20 cm post op incision with stitches in place with mild erythema, moist, no surrounding erythema No penile shaft on exam, foley catheter appears to be in proper position externally  Musculoskeletal: Normal range of motion.  Neurological: He is alert and oriented to person, place, and time. No cranial nerve deficit.  Skin: Skin is warm and dry.  Psychiatric: He has a normal mood and  affect. His behavior is normal.  Nursing note and vitals reviewed.   ED Course  Procedures (including critical care time)  DIAGNOSTIC STUDIES: Oxygen Saturation is 97% on RA, normal by my interpretation.    COORDINATION OF CARE: 8:51 PM Discussed treatment plan with pt at bedside and pt agreed to plan.   Labs Review Labs Reviewed - No data to display  Imaging Review No results found. I have personally reviewed and evaluated these images and lab results as part of my medical decision-making.    EKG Interpretation None      MDM   Final diagnoses:  UTI (lower urinary tract infection)  Urinary retention   Patient presents with urinary retention, mild sediment, mild hematuria. Patient had recent urine culture done. Rocephin sensitive, intramuscular Rocephin given in the ER. No sign of infection around the wound. Stress close follow-up with surgery early this week.  750 cc on scanner, patient had irrigation, Foley working properly currently. No fever in the ER, well-appearing. Discussed details and plan with Son, he will help ensure close outpt fup.  Pt very independent for his age.  Results and differential diagnosis were discussed with the patient/parent/guardian. Xrays were  independently reviewed by myself.  Close follow up outpatient was discussed, comfortable with the plan.   Medications  cefTRIAXone (ROCEPHIN) injection 1 g (not administered)    Filed Vitals:   03/06/16 1815 03/06/16 2053  BP: 153/69 142/78  Pulse: 84 76  Temp: 98.8 F (37.1 C)   Resp: 18 16  Height: 6' (1.829 m)   Weight: 240 lb (108.863 kg)   SpO2: 97% 99%    Final diagnoses:  UTI (lower urinary tract infection)  Urinary retention       Elnora Morrison, MD 03/06/16 [id number]  Elnora Morrison, MD 03/06/16 2257

## 2016-03-13 DIAGNOSIS — C609 Malignant neoplasm of penis, unspecified: Secondary | ICD-10-CM | POA: Diagnosis not present

## 2016-03-17 ENCOUNTER — Encounter (HOSPITAL_COMMUNITY): Payer: Self-pay | Admitting: Emergency Medicine

## 2016-03-17 ENCOUNTER — Emergency Department (HOSPITAL_COMMUNITY)
Admission: EM | Admit: 2016-03-17 | Discharge: 2016-03-17 | Disposition: A | Discharge status: Home / Self Care | Payer: Medicare Other | Attending: Emergency Medicine | Admitting: Emergency Medicine

## 2016-03-17 DIAGNOSIS — Z8549 Personal history of malignant neoplasm of other male genital organs: Secondary | ICD-10-CM | POA: Insufficient documentation

## 2016-03-17 DIAGNOSIS — I2510 Atherosclerotic heart disease of native coronary artery without angina pectoris: Secondary | ICD-10-CM | POA: Insufficient documentation

## 2016-03-17 DIAGNOSIS — R339 Retention of urine, unspecified: Secondary | ICD-10-CM | POA: Diagnosis present

## 2016-03-17 DIAGNOSIS — E669 Obesity, unspecified: Secondary | ICD-10-CM | POA: Insufficient documentation

## 2016-03-17 DIAGNOSIS — E785 Hyperlipidemia, unspecified: Secondary | ICD-10-CM | POA: Diagnosis not present

## 2016-03-17 DIAGNOSIS — Z87891 Personal history of nicotine dependence: Secondary | ICD-10-CM | POA: Insufficient documentation

## 2016-03-17 DIAGNOSIS — Z79899 Other long term (current) drug therapy: Secondary | ICD-10-CM | POA: Insufficient documentation

## 2016-03-17 LAB — URINALYSIS, ROUTINE W REFLEX MICROSCOPIC
BILIRUBIN URINE: NEGATIVE
Glucose, UA: NEGATIVE mg/dL
Hgb urine dipstick: NEGATIVE
KETONES UR: NEGATIVE mg/dL
LEUKOCYTES UA: NEGATIVE
NITRITE: NEGATIVE
PH: 5 (ref 5.0–8.0)
PROTEIN: NEGATIVE mg/dL
Specific Gravity, Urine: 1.02 (ref 1.005–1.030)

## 2016-03-17 LAB — CBC WITH DIFFERENTIAL/PLATELET
BASOS ABS: 0 10*3/uL (ref 0.0–0.1)
BASOS PCT: 1 %
EOS ABS: 0.2 10*3/uL (ref 0.0–0.7)
Eosinophils Relative: 4 %
HEMATOCRIT: 33 % — AB (ref 39.0–52.0)
HEMOGLOBIN: 10.5 g/dL — AB (ref 13.0–17.0)
Lymphocytes Relative: 25 %
Lymphs Abs: 1.4 10*3/uL (ref 0.7–4.0)
MCH: 29.6 pg (ref 26.0–34.0)
MCHC: 31.8 g/dL (ref 30.0–36.0)
MCV: 93 fL (ref 78.0–100.0)
MONOS PCT: 9 %
Monocytes Absolute: 0.5 10*3/uL (ref 0.1–1.0)
NEUTROS ABS: 3.5 10*3/uL (ref 1.7–7.7)
NEUTROS PCT: 61 %
Platelets: 201 10*3/uL (ref 150–400)
RBC: 3.55 MIL/uL — AB (ref 4.22–5.81)
RDW: 14.8 % (ref 11.5–15.5)
WBC: 5.7 10*3/uL (ref 4.0–10.5)

## 2016-03-17 LAB — BASIC METABOLIC PANEL
ANION GAP: 6 (ref 5–15)
BUN: 29 mg/dL — ABNORMAL HIGH (ref 6–20)
CALCIUM: 9.4 mg/dL (ref 8.9–10.3)
CO2: 27 mmol/L (ref 22–32)
CREATININE: 1.45 mg/dL — AB (ref 0.61–1.24)
Chloride: 109 mmol/L (ref 101–111)
GFR, EST AFRICAN AMERICAN: 48 mL/min — AB (ref >60)
GFR, EST NON AFRICAN AMERICAN: 41 mL/min — AB (ref >60)
Glucose, Bld: 92 mg/dL (ref 65–99)
Potassium: 4.4 mmol/L (ref 3.5–5.1)
SODIUM: 142 mmol/L (ref 135–145)

## 2016-03-17 NOTE — ED Provider Notes (Signed)
CSN: [id number]     Arrival date & time 03/17/16  1804 History   First MD Initiated Contact with Patient 03/17/16 1927     Chief Complaint  Patient presents with  . Urinary Retention    HPI Pt presents with complaints of urinary retention.  He has a remote history of penile ca s/p penectomy who recently had recurrent lymph node swelling.  He had lymph node dissection performed at [facility] within the last month and has  A suprapubic JP drain.  Since this procedure he has had some difficulty with urinary retention.  He had a foley placed a couple of weeks ago and had it removed at [place] ago.  He was doing well until today.  He has been trying to catheterize at home without success.  He feels like his bladder is full.  No fever.  No vomiting. Past Medical History  Diagnosis Date  . Hyperlipidemia   . Coronary artery disease   . Obesity   . Hyperlipidemia   . Swelling of extremity     Left Leg  . Penile cancer [facility])    Past Surgical History  Procedure Laterality Date  . Appendectomy    . Cholecystectomy    . Coronary artery bypass graft    . Hiatal hernia repair    . Back surgery    . Lymph node removal    . Coronary angioplasty  11/2005    RCA PCI AND STENTING WITH A CYPHER DES  . Transthoracic echocardiogram  12/22/2005    MILD TO MOD AORTIC SCLEROSIS W/O STENOSIS. MILD TO MODERATE MITRAL CALCIFICATION. LA- MILDLY DILATED.  Marland Kitchen Nm myocar multiple w/spect  11/26/2009    EF 62%. NORMAL MYOCARDIAL PERFUSION STUDY.   Family History  Problem Relation Age of Onset  . Cancer    . Cancer Father   . Cancer Sister   . Cancer Brother   . Cancer Sister    Social History  Substance Use Topics  . Smoking status: Former Smoker    Types: Cigarettes    Quit date: 07/14/1983  . Smokeless tobacco: Never Used  . Alcohol Use: No    Review of Systems  All other systems reviewed and are negative.     Allergies  Review of patient's allergies indicates no known allergies.  Home  Medications   Prior to Admission medications   Medication Sig Start Date End Date Taking? Authorizing Provider  Ascorbic Acid (VITAMIN C) 1000 MG tablet Take 1,000 mg by mouth daily.    Yes Historical Provider, MD  aspirin EC 81 MG tablet Take 81 mg by mouth daily.   Yes Historical Provider, MD  Calcium Carbonate-Vitamin D (CALCIUM-D PO) Take 1 tablet by mouth daily.   Yes Historical Provider, MD  clopidogrel (PLAVIX) 75 MG tablet Take 1 tablet by mouth  daily 07/24/15  Yes Lorretta Harp, MD  metoprolol tartrate (LOPRESSOR) 25 MG tablet Take 1 tablet by mouth  twice a day 07/24/15  Yes Lorretta Harp, MD  Multiple Vitamin (MULTIVITAMIN WITH MINERALS) TABS tablet Take 1 tablet by mouth daily. Centrum Silver   Yes Historical Provider, MD  polyethylene glycol powder (GLYCOLAX/MIRALAX) powder Take 17 g by mouth daily as needed for mild constipation or moderate constipation.   Yes Historical Provider, MD  simvastatin (ZOCOR) 20 MG tablet Take 1 tablet by mouth  every night at bedtime 07/24/15  Yes Lorretta Harp, MD  acetaminophen (TYLENOL) 325 MG tablet Take 1-2 tablets (325-650 mg total)  by mouth every 6 (six) hours as needed for fever, headache, mild pain or moderate pain. 01/14/16   Nat Christen, PA-C  Nystatin [facility]) 100000 UNIT/GM POWD Apply topically to affected area(s) twice daily 02/24/16   Historical Provider, MD  oxybutynin (DITROPAN) 5 MG tablet Take 5 mg by mouth 2 (two) times daily.  02/29/16 02/28/17  Historical Provider, MD   BP 136/70 mmHg  Pulse 82  Temp(Src) 98.7 F (37.1 C) (Oral)  Resp 16  Ht 6' (1.829 m)  Wt 105.688 kg  BMI 31.59 kg/m2  SpO2 94% Physical Exam  Constitutional: No distress.  HENT:  Head: Normocephalic and atraumatic.  Right Ear: External ear normal.  Left Ear: External ear normal.  Eyes: Conjunctivae are normal. Right eye exhibits no discharge. Left eye exhibits no discharge. No scleral icterus.  Neck: Neck supple. No tracheal deviation present.   Cardiovascular: Normal rate, regular rhythm and intact distal pulses.   Pulmonary/Chest: Effort normal and breath sounds normal. No stridor. No respiratory distress. He has no wheezes. He has no rales.  Abdominal: Soft. Bowel sounds are normal. He exhibits no distension. There is no tenderness. There is no rebound and no guarding.  Genitourinary:  JP drain in the suprapubic area draining clear yellow fluid, mild erythema around the incision sites without any tenderness or purulence, status post penectomy  Musculoskeletal: He exhibits no edema or tenderness.  Neurological: He is alert. He has normal strength. No cranial nerve deficit (no facial droop, extraocular movements intact, no slurred speech) or sensory deficit. He exhibits normal muscle tone. He displays no seizure activity. Coordination normal.  Skin: Skin is warm and dry. No rash noted.  Psychiatric: He has a normal mood and affect.  Nursing note and vitals reviewed.   ED Course  Procedures (including critical care time) Labs Review Labs Reviewed  BASIC METABOLIC PANEL - Abnormal; Notable for the following:    BUN 29 (*)    Creatinine, Ser 1.45 (*)    GFR calc non Af Amer 41 (*)    GFR calc Af Amer 48 (*)    All other components within normal limits  CBC WITH DIFFERENTIAL/PLATELET - Abnormal; Notable for the following:    RBC 3.55 (*)    Hemoglobin 10.5 (*)    HCT 33.0 (*)    All other components within normal limits  URINALYSIS, ROUTINE W REFLEX MICROSCOPIC (NOT AT [facility])    Foley catheter placed.  >1000 cc urine drained while he was here  MDM   Final diagnoses:  Urinary retention    No UTI.  Recurrent urinary retention.  Pt self catheterizes but has not been able to successfully drain his bladder at home.  I am not sure if the small catheter is curling up in the urethra and that is why he is having trouble.  Will dc with his foley in place.  Pt will follow up with his urologist at [facility] this week as  scheduled.    Dorie Rank, MD 03/17/16 2134

## 2016-03-17 NOTE — ED Notes (Signed)
Pt self caths and has not been able to get urine out since yesterday afternoon.

## 2016-03-17 NOTE — Discharge Instructions (Signed)
Acute Urinary Retention, Male  Acute urinary retention is when you are unable to pee (urinate). Acute urinary retention is common in older men. Prostates can get bigger, which blocks the flow of pee.   HOME CARE  · Drink enough fluids to keep your pee clear or pale yellow.  · If you are sent home with a tube that drains the bladder (catheter), there will be a drainage bag attached to it. There are two types of bags. One is big that you can wear at night without having to empty it. One is smaller and needs to be emptied more often.    Keep the drainage bag empty.    Keep the drainage bag lower than your catheter.  · Only take medicine as told by your doctor.  GET HELP IF:  · You have a low-grade fever.  · You have spasms or you are leaking pee when you have spasms.  GET HELP RIGHT AWAY IF:   · You have chills or a fever.  · Your catheter stops draining pee.  · Your catheter falls out.  · You have increased bleeding that does not stop after you have rested and increased the amount of fluids you had been drinking.  MAKE SURE YOU:   · Understand these instructions.  · Will watch your condition.  · Will get help right away if you are not doing well or get worse.     This information is not intended to replace advice given to you by your health care provider. Make sure you discuss any questions you have with your health care provider.     Document Released: 06/01/2008 Document Revised: 04/30/2015 Document Reviewed: 05/25/2013  Elsevier Interactive Patient Education ©2016 [employer]vier Inc[employer]

## 2016-03-17 NOTE — ED Notes (Signed)
Bladder scan revealed approximately 343ml of urine in bladder

## 2016-03-25 DIAGNOSIS — C609 Malignant neoplasm of penis, unspecified: Secondary | ICD-10-CM | POA: Diagnosis not present

## 2016-04-02 DIAGNOSIS — Z1389 Encounter for screening for other disorder: Secondary | ICD-10-CM | POA: Diagnosis not present

## 2016-04-02 DIAGNOSIS — Z0000 Encounter for general adult medical examination without abnormal findings: Secondary | ICD-10-CM | POA: Diagnosis not present

## 2016-04-02 DIAGNOSIS — Z6841 Body Mass Index (BMI) 40.0 and over, adult: Secondary | ICD-10-CM | POA: Diagnosis not present

## 2016-04-03 DIAGNOSIS — Z0000 Encounter for general adult medical examination without abnormal findings: Secondary | ICD-10-CM | POA: Diagnosis not present

## 2016-04-09 DIAGNOSIS — N390 Urinary tract infection, site not specified: Secondary | ICD-10-CM | POA: Diagnosis not present

## 2016-04-10 DIAGNOSIS — N390 Urinary tract infection, site not specified: Secondary | ICD-10-CM | POA: Diagnosis not present

## 2016-05-21 DIAGNOSIS — J343 Hypertrophy of nasal turbinates: Secondary | ICD-10-CM | POA: Diagnosis not present

## 2016-05-21 DIAGNOSIS — Z6831 Body mass index (BMI) 31.0-31.9, adult: Secondary | ICD-10-CM | POA: Diagnosis not present

## 2016-05-21 DIAGNOSIS — J069 Acute upper respiratory infection, unspecified: Secondary | ICD-10-CM | POA: Diagnosis not present

## 2016-05-21 DIAGNOSIS — J209 Acute bronchitis, unspecified: Secondary | ICD-10-CM | POA: Diagnosis not present

## 2016-05-21 DIAGNOSIS — E782 Mixed hyperlipidemia: Secondary | ICD-10-CM | POA: Diagnosis not present

## 2016-05-21 DIAGNOSIS — Z1389 Encounter for screening for other disorder: Secondary | ICD-10-CM | POA: Diagnosis not present

## 2016-05-27 DIAGNOSIS — Z6831 Body mass index (BMI) 31.0-31.9, adult: Secondary | ICD-10-CM | POA: Diagnosis not present

## 2016-05-27 DIAGNOSIS — I2510 Atherosclerotic heart disease of native coronary artery without angina pectoris: Secondary | ICD-10-CM | POA: Diagnosis not present

## 2016-05-27 DIAGNOSIS — Z1389 Encounter for screening for other disorder: Secondary | ICD-10-CM | POA: Diagnosis not present

## 2016-05-27 DIAGNOSIS — I10 Essential (primary) hypertension: Secondary | ICD-10-CM | POA: Diagnosis not present

## 2016-05-27 DIAGNOSIS — E782 Mixed hyperlipidemia: Secondary | ICD-10-CM | POA: Diagnosis not present

## 2016-06-11 DIAGNOSIS — H353122 Nonexudative age-related macular degeneration, left eye, intermediate dry stage: Secondary | ICD-10-CM | POA: Diagnosis not present

## 2016-06-11 DIAGNOSIS — H353212 Exudative age-related macular degeneration, right eye, with inactive choroidal neovascularization: Secondary | ICD-10-CM | POA: Diagnosis not present

## 2016-06-17 ENCOUNTER — Ambulatory Visit (INDEPENDENT_AMBULATORY_CARE_PROVIDER_SITE_OTHER): Payer: Medicare Other | Admitting: Cardiovascular Disease

## 2016-06-17 ENCOUNTER — Encounter: Payer: Self-pay | Admitting: Cardiovascular Disease

## 2016-06-17 VITALS — BP 140/66 | HR 71 | Ht 73.0 in | Wt 233.0 lb

## 2016-06-17 DIAGNOSIS — I739 Peripheral vascular disease, unspecified: Secondary | ICD-10-CM | POA: Diagnosis not present

## 2016-06-17 DIAGNOSIS — I2510 Atherosclerotic heart disease of native coronary artery without angina pectoris: Secondary | ICD-10-CM

## 2016-06-17 DIAGNOSIS — E785 Hyperlipidemia, unspecified: Secondary | ICD-10-CM | POA: Diagnosis not present

## 2016-06-17 DIAGNOSIS — I2583 Coronary atherosclerosis due to lipid rich plaque: Secondary | ICD-10-CM

## 2016-06-17 NOTE — Assessment & Plan Note (Signed)
History of CAD status post Cypher drug-eluting stenting of his RCA by myself December 2006. He denies chest pain or shortness of breath.

## 2016-06-17 NOTE — Patient Instructions (Signed)

## 2016-06-17 NOTE — Assessment & Plan Note (Signed)
History of hyperlipidemia on statin therapy followed by his PCP 

## 2016-06-17 NOTE — Progress Notes (Signed)
06/17/2016 Caleb [name]   [birth year]  [id number]  Primary Physician Purvis Kilts, MD Primary Cardiologist: Lorretta Harp MD Renae Gloss  HPI:  The patient is an 82 year old, overweight Caucasian male with a history of coronary artery disease status post RCA PCI and stenting with a Cypher drug-eluting stent December 2006. He also had a 50% mid LAD lesion with normal LV function at that time. His last 2D echocardiogram was in 2006 as well which showed normal LV function possibly at the lower limits of normal. He has mild to moderate aortic sclerosis without stenosis. Mild to moderate mitral annular calcification. Left atrium was mildly dilated. He also has a history of hyperlipidemia.    I saw him n the office 06/21/15. Since that time he denies chest pain or shortness of breath.   Current Outpatient Prescriptions  Medication Sig Dispense Refill  . acetaminophen (TYLENOL) 325 MG tablet Take 1-2 tablets (325-650 mg total) by mouth every 6 (six) hours as needed for fever, headache, mild pain or moderate pain.    . Ascorbic Acid (VITAMIN C) 1000 MG tablet Take 1,000 mg by mouth daily.     Marland Kitchen aspirin EC 81 MG tablet Take 81 mg by mouth daily.    . Calcium Carbonate-Vitamin D (CALCIUM-D PO) Take 1 tablet by mouth daily.    . clopidogrel (PLAVIX) 75 MG tablet Take 1 tablet by mouth  daily 90 tablet 3  . metoprolol tartrate (LOPRESSOR) 25 MG tablet Take 1 tablet by mouth  twice a day 180 tablet 3  . Multiple Vitamin (MULTIVITAMIN WITH MINERALS) TABS tablet Take 1 tablet by mouth daily. Centrum Silver    . Nystatin (NYAMYC) 100000 UNIT/GM POWD Apply topically to affected area(s) twice daily  1  . oxybutynin (DITROPAN) 5 MG tablet Take 5 mg by mouth 2 (two) times daily.     . polyethylene glycol powder (GLYCOLAX/MIRALAX) powder Take 17 g by mouth daily as needed for mild constipation or moderate constipation.    . simvastatin (ZOCOR) 20 MG tablet Take 1 tablet by mouth   every night at bedtime 90 tablet 3   No current facility-administered medications for this visit.    No Known Allergies  Social History   Social History  . Marital Status: Divorced    Spouse Name: N/A  . Number of Children: N/A  . Years of Education: N/A   Occupational History  . retired    Social History Main Topics  . Smoking status: Former Smoker    Types: Cigarettes    Quit date: 07/14/1983  . Smokeless tobacco: Never Used  . Alcohol Use: No  . Drug Use: No  . Sexual Activity: Not on file   Other Topics Concern  . Not on file   Social History Narrative     Review of Systems: General: negative for chills, fever, night sweats or weight changes.  Cardiovascular: negative for chest pain, dyspnea on exertion, edema, orthopnea, palpitations, paroxysmal nocturnal dyspnea or shortness of breath Dermatological: negative for rash Respiratory: negative for cough or wheezing Urologic: negative for hematuria Abdominal: negative for nausea, vomiting, diarrhea, bright red blood per rectum, melena, or hematemesis Neurologic: negative for visual changes, syncope, or dizziness All other systems reviewed and are otherwise negative except as noted above.    Blood pressure 140/66, pulse 71, height 6\' 1"  (1.854 m), weight 233 lb (105.688 kg).  General appearance: alert and no distress Neck: no adenopathy, no carotid bruit, no JVD, supple,  symmetrical, trachea midline and thyroid not enlarged, symmetric, no tenderness/mass/nodules Lungs: clear to auscultation bilaterally Heart: regular rate and rhythm, S1, S2 normal, no murmur, click, rub or gallop Extremities: extremities normal, atraumatic, no cyanosis or edema  EKG normal sinus rhythm at 71 without ST or T-wave changes. Hypercellular reviewed this EKG  ASSESSMENT AND PLAN:   Hyperlipidemia History of hyperlipidemia on statin therapy followed by his PCP  Coronary artery disease History of CAD status post Cypher drug-eluting  stenting of his RCA by myself December 2006. He denies chest pain or shortness of breath.      Lorretta Harp MD FACP,FACC,FAHA, [facility] 06/17/2016 2:42 PM

## 2016-06-24 ENCOUNTER — Ambulatory Visit: Payer: Medicare Other | Admitting: Cardiovascular Disease

## 2016-06-24 DIAGNOSIS — R599 Enlarged lymph nodes, unspecified: Secondary | ICD-10-CM | POA: Diagnosis not present

## 2016-06-24 DIAGNOSIS — C069 Malignant neoplasm of mouth, unspecified: Secondary | ICD-10-CM | POA: Diagnosis not present

## 2016-06-24 DIAGNOSIS — C609 Malignant neoplasm of penis, unspecified: Secondary | ICD-10-CM | POA: Diagnosis not present

## 2016-06-24 DIAGNOSIS — N319 Neuromuscular dysfunction of bladder, unspecified: Secondary | ICD-10-CM | POA: Diagnosis not present

## 2016-06-26 DIAGNOSIS — H5213 Myopia, bilateral: Secondary | ICD-10-CM | POA: Diagnosis not present

## 2016-06-26 DIAGNOSIS — H25813 Combined forms of age-related cataract, bilateral: Secondary | ICD-10-CM | POA: Diagnosis not present

## 2016-08-05 DIAGNOSIS — E6609 Other obesity due to excess calories: Secondary | ICD-10-CM | POA: Diagnosis not present

## 2016-08-05 DIAGNOSIS — N342 Other urethritis: Secondary | ICD-10-CM | POA: Diagnosis not present

## 2016-08-05 DIAGNOSIS — Z1389 Encounter for screening for other disorder: Secondary | ICD-10-CM | POA: Diagnosis not present

## 2016-08-05 DIAGNOSIS — Z6831 Body mass index (BMI) 31.0-31.9, adult: Secondary | ICD-10-CM | POA: Diagnosis not present

## 2016-08-07 ENCOUNTER — Other Ambulatory Visit: Payer: Self-pay | Admitting: Cardiovascular Disease

## 2016-09-08 DIAGNOSIS — N342 Other urethritis: Secondary | ICD-10-CM | POA: Diagnosis not present

## 2016-09-08 DIAGNOSIS — C609 Malignant neoplasm of penis, unspecified: Secondary | ICD-10-CM | POA: Diagnosis not present

## 2016-09-08 DIAGNOSIS — Z6833 Body mass index (BMI) 33.0-33.9, adult: Secondary | ICD-10-CM | POA: Diagnosis not present

## 2016-09-08 DIAGNOSIS — T839XXD Unspecified complication of genitourinary prosthetic device, implant and graft, subsequent encounter: Secondary | ICD-10-CM | POA: Diagnosis not present

## 2016-09-08 DIAGNOSIS — E6609 Other obesity due to excess calories: Secondary | ICD-10-CM | POA: Diagnosis not present

## 2016-09-09 DIAGNOSIS — N390 Urinary tract infection, site not specified: Secondary | ICD-10-CM | POA: Diagnosis not present

## 2016-09-18 DIAGNOSIS — Z6833 Body mass index (BMI) 33.0-33.9, adult: Secondary | ICD-10-CM | POA: Diagnosis not present

## 2016-09-18 DIAGNOSIS — M1991 Primary osteoarthritis, unspecified site: Secondary | ICD-10-CM | POA: Diagnosis not present

## 2016-09-18 DIAGNOSIS — M19049 Primary osteoarthritis, unspecified hand: Secondary | ICD-10-CM | POA: Diagnosis not present

## 2016-09-23 DIAGNOSIS — C609 Malignant neoplasm of penis, unspecified: Secondary | ICD-10-CM | POA: Diagnosis not present

## 2016-10-06 DIAGNOSIS — Z23 Encounter for immunization: Secondary | ICD-10-CM | POA: Diagnosis not present

## 2016-11-18 DIAGNOSIS — Z08 Encounter for follow-up examination after completed treatment for malignant neoplasm: Secondary | ICD-10-CM | POA: Diagnosis not present

## 2016-11-18 DIAGNOSIS — C609 Malignant neoplasm of penis, unspecified: Secondary | ICD-10-CM | POA: Diagnosis not present

## 2016-11-18 DIAGNOSIS — Z8549 Personal history of malignant neoplasm of other male genital organs: Secondary | ICD-10-CM | POA: Diagnosis not present

## 2016-12-09 ENCOUNTER — Encounter: Payer: Self-pay | Admitting: *Deleted

## 2016-12-10 ENCOUNTER — Encounter: Payer: Self-pay | Admitting: Family

## 2016-12-17 ENCOUNTER — Encounter: Payer: Self-pay | Admitting: Family

## 2016-12-17 ENCOUNTER — Ambulatory Visit (INDEPENDENT_AMBULATORY_CARE_PROVIDER_SITE_OTHER): Payer: Medicare Other | Admitting: Family

## 2016-12-17 VITALS — BP 127/73 | HR 64 | Temp 97.6°F | Resp 16 | Ht 73.0 in | Wt 254.0 lb

## 2016-12-17 DIAGNOSIS — I872 Venous insufficiency (chronic) (peripheral): Secondary | ICD-10-CM

## 2016-12-17 DIAGNOSIS — R609 Edema, unspecified: Secondary | ICD-10-CM

## 2016-12-17 DIAGNOSIS — I2510 Atherosclerotic heart disease of native coronary artery without angina pectoris: Secondary | ICD-10-CM | POA: Diagnosis not present

## 2016-12-17 DIAGNOSIS — I2583 Coronary atherosclerosis due to lipid rich plaque: Secondary | ICD-10-CM | POA: Diagnosis not present

## 2016-12-17 DIAGNOSIS — M7989 Other specified soft tissue disorders: Secondary | ICD-10-CM

## 2016-12-17 NOTE — Patient Instructions (Signed)
Venous Stasis or Chronic Venous Insufficiency Chronic venous insufficiency, also called venous stasis, is a condition that affects the veins in the legs. The condition prevents blood from being pumped through these veins effectively. Blood may no longer be pumped effectively from the legs back to the heart. This condition can range from mild to severe. With proper treatment, you should be able to continue with an active life. CAUSES  Chronic venous insufficiency occurs when the vein walls become stretched, weakened, or damaged or when valves within the vein are damaged. Some common causes of this include:  High blood pressure inside the veins (venous hypertension).  Increased blood pressure in the leg veins from long periods of sitting or standing.  A blood clot that blocks blood flow in a vein (deep vein thrombosis).  Inflammation of a superficial vein (phlebitis) that causes a blood clot to form. RISK FACTORS Various things can make you more likely to develop chronic venous insufficiency, including:  Family history of this condition.  Obesity.  Pregnancy.  Sedentary lifestyle.  Smoking.  Jobs requiring long periods of standing or sitting in one place.  Being a certain age. Women in their 40s and 50s and men in their 70s are more likely to develop this condition. SIGNS AND SYMPTOMS  Symptoms may include:   Varicose veins.  Skin breakdown or ulcers.  Reddened or discolored skin on the leg.  Brown, smooth, tight, and painful skin just above the ankle, usually on the inside surface (lipodermatosclerosis).  Swelling. DIAGNOSIS  To diagnose this condition, your health care provider will take a medical history and do a physical exam. The following tests may be ordered to confirm the diagnosis:  Duplex ultrasound-A procedure that produces a picture of a blood vessel and nearby organs and also provides information on blood flow through the blood vessel.  Plethysmography-A  procedure that tests blood flow.  A venogram, or venography-A procedure used to look at the veins using X-ray and dye. TREATMENT The goals of treatment are to help you return to an active life and to minimize pain or disability. Treatment will depend on the severity of the condition. Medical procedures may be needed for severe cases. Treatment options may include:   Use of compression stockings. These can help with symptoms and lower the chances of the problem getting worse, but they do not cure the problem.  Sclerotherapy-A procedure involving an injection of a material that "dissolves" the damaged veins. Other veins in the network of blood vessels take over the function of the damaged veins.  Surgery to remove the vein or cut off blood flow through the vein (vein stripping or laser ablation surgery).  Surgery to repair a valve. HOME CARE INSTRUCTIONS   Wear compression stockings as directed by your health care provider.  Only take over-the-counter or prescription medicines for pain, discomfort, or fever as directed by your health care provider.  Follow up with your health care provider as directed. SEEK MEDICAL CARE IF:   You have redness, swelling, or increasing pain in the affected area.  You see a red streak or line that extends up or down from the affected area.  You have a breakdown or loss of skin in the affected area, even if the breakdown is small.  You have an injury to the affected area. SEEK IMMEDIATE MEDICAL CARE IF:   You have an injury and open wound in the affected area.  Your pain is severe and does not improve with medicine.  You have   sudden numbness or weakness in the foot or ankle below the affected area, or you have trouble moving your foot or ankle.  You have a fever or persistent symptoms for more than 2-3 days.  You have a fever and your symptoms suddenly get worse. MAKE SURE YOU:   Understand these instructions.  Will watch your condition.  Will  get help right away if you are not doing well or get worse. This information is not intended to replace advice given to you by your health care provider. Make sure you discuss any questions you have with your health care provider. Document Released: 04/19/2007 Document Revised: 10/04/2013 Document Reviewed: 08/21/2013 Elsevier Interactive Patient Education  2017 [employer]  

## 2016-12-17 NOTE — Progress Notes (Signed)
CC: Established Venous Insufficiency, swelling of right lower leg  History of Present Illness  Caleb [name] is a 82 y.o. (06-Feb-1925) male patient of Dr. Oneida Alar.  He returns today with swelling and discoloration of the right leg that has worsened over the last few months.  Dr. Oneida Alar last evaluated pt on 06-06-15 after recent CT scan of abdomen and pelvis. CT abdomen and pelvis showed no aneurysmal dilation of the right common femoral vein. There is no obvious lymphadenopathy. There is no obstruction of the vein.  His right leg was swelling more and he had more swelling in his thigh and groin. He states that earlier that year, he was diagnosed with penile cancer and underwent a penectomy in February as well as lymph node dissection of both groins.  He does wear compression stockings. He does have a hx of laser ablation at [facility] approximately 2010. He has never had a DVT.  He has chronic lower extremity swelling. Most likely multifactorial from lymphadenectomy as well as prior history of deep vein reflux. Mainstay of therapy will be compression long-term. The patient was to follow-up on as-needed basis.    He has hx of cardiac stent and is on Plavix.  He had 2 falls in 2014 and injured his right knee. He was seen by Dr. Theda Sers for this. His knee checked out okay. He was referred here by Dr. Theda Sers for further evaluation of his leg swelling. Patient previously had what sounds like laser ablation of his right and left greater saphenous veins by Dr. Eilleen Kempf approximately 5 years ago. The leg swelling had been present for 3-4 weeks. He had had chronic staining of the skin in both legs. He had some itching and weeping in the right leg.  He denies claudication or rest pain.  Pt denies history of stroke, TIA, or CAD.   He does have compression stockings but has not been very compliant with these. Other medical problems include hyperlipidemia, coronary disease, obesity,  hyperlipidemia all of which are currently stable. He did have evidence of some incompetence of the right lesser saphenous vein on previous office visit.   Left Leg venous stasis ulcer has healed.  He states he is not elevating his legs when not walking and does not walk much, no reason, no barriers.    Past Medical History:  Diagnosis Date  . Coronary artery disease   . Hyperlipidemia   . Hyperlipidemia   . Obesity   . Penile cancer ([place])   . Swelling of extremity    Left Leg    Social History Social History  Substance Use Topics  . Smoking status: Former Smoker    Types: Cigarettes    Quit date: 07/14/1983  . Smokeless tobacco: Never Used  . Alcohol use No    Family History Family History  Problem Relation Age of Onset  . Cancer    . Cancer Father   . Cancer Sister   . Cancer Brother   . Cancer Sister     Surgical History Past Surgical History:  Procedure Laterality Date  . APPENDECTOMY    . BACK SURGERY    . CHOLECYSTECTOMY    . CORONARY ANGIOPLASTY  11/2005   RCA PCI AND STENTING WITH A CYPHER DES  . CORONARY ARTERY BYPASS GRAFT    . HIATAL HERNIA REPAIR    . lymph node removal    . NM MYOCAR MULTIPLE W/SPECT  11/26/2009   EF 62%. NORMAL MYOCARDIAL PERFUSION STUDY.  Marland Kitchen  TRANSTHORACIC ECHOCARDIOGRAM  12/22/2005   MILD TO MOD AORTIC SCLEROSIS W/O STENOSIS. MILD TO MODERATE MITRAL CALCIFICATION. LA- MILDLY DILATED.    No Known Allergies  Current Outpatient Prescriptions  Medication Sig Dispense Refill  . acetaminophen (TYLENOL) 325 MG tablet Take 1-2 tablets (325-650 mg total) by mouth every 6 (six) hours as needed for fever, headache, mild pain or moderate pain.    . Ascorbic Acid (VITAMIN C) 1000 MG tablet Take 1,000 mg by mouth daily.     Marland Kitchen aspirin EC 81 MG tablet Take 81 mg by mouth daily.    . Calcium Carbonate-Vitamin D (CALCIUM-D PO) Take 1 tablet by mouth daily.    . clopidogrel (PLAVIX) 75 MG tablet Take 1 tablet by mouth  daily 90 tablet 3  .  metoprolol tartrate (LOPRESSOR) 25 MG tablet Take 1 tablet by mouth  twice a day 180 tablet 3  . Multiple Vitamin (MULTIVITAMIN WITH MINERALS) TABS tablet Take 1 tablet by mouth daily. Centrum Silver    . Nystatin (NYAMYC) 100000 UNIT/GM POWD Apply topically to affected area(s) twice daily  1  . oxybutynin (DITROPAN) 5 MG tablet Take 5 mg by mouth 2 (two) times daily.     . polyethylene glycol powder (GLYCOLAX/MIRALAX) powder Take 17 g by mouth daily as needed for mild constipation or moderate constipation.    . simvastatin (ZOCOR) 20 MG tablet Take 1 tablet by mouth  every night at bedtime 90 tablet 3   No current facility-administered medications for this visit.     On ROS today: See HPI for pertinent positives and negatives.  Physical Examination  Vitals:   12/17/16 1148  BP: 127/73  Pulse: 64  Resp: 16  Temp: 97.6 F (36.4 C)  TempSrc: Oral  SpO2: 93%  Weight: 254 lb (115.2 kg)  Height: 6\' 1"  (1.854 m)   Body mass index is 33.51 kg/m.  General: The patient appears their stated age, obese male.  HEENT: No gross abnormalities  Pulmonary: Respirations are non-labored  Abdomen: Soft and non-tender.  Musculoskeletal: There are no major deformities.  Neurologic: No focal weakness or paresthesias are detected,  Skin: There are no rashes noted, extensive staining in both lower legs, no ulcers. Right lower leg with 3+ non pitting edema and trace pitting edema, with chronic venous stasis dermatitis, no evidence of cellulitis. Left lower leg with trace pitting edema, less stasis dermatitis than the right leg.  Psychiatric: The patient has normal affect.  Cardiovascular: There is a regular rate and rhythm.  Vascular:  Vessel  Right  Left   Radial  Palpable  Palpable   Aorta  Not palpable  N/A   Popliteal  Not palpable  Not palpable   PT  1+ Palpable  1+ Palpable   DP  2+ Palpable  2+ Palpable     Non-Invasive Vascular Imaging Lower Extremity Venous Duplex Reflux Evaluation  05/23/15 1. No evidence of DVT or superficial venous thrombophlebitis in the RLE 2. Right saphenous vein could not be identified 3. Evidence of deep venous reflux in the right common femoral, femoral and PT veins. Peroneal veins not visualized.  **Non-vascularized fluid filled cystic structure in the proximal mid thigh measuring 1.7cm x 2.2cm. **Significant extravasation is observed in the right groin. Flow is pulsatile with velocities > 400cm/s but appear to communicate with the common femoral vein. (Does not look like a fistula; vein itself is normal and compressible).    Medical Decision Making  PORTLAND MINTEN is a 82 y.o. male  who presents with: bilateral leg chronic venous insufficiency.  I discussed with Dr. Oneida Alar pt HPI and physical exam results. There is no evidence of cellulitis, pt does not c/o pain in his legs. He admits that he is not elevating his legs, apparently legs are mostly dependent during the day. I instructed pt to elevate his feet above his slightly flexed knees overnight and 3-4x/day for 20 minutes.   Based on the patient's HPIA and physical examination, and after discussing with Dr. Oneida Alar, I have offered the patient: follow up with [place] prn, with his PCP for suspected cellulitis.  Thank you for allowing [place] to participate in this patient's care.  Caleb Chambers, RN, MSN, FNP-C [facility] Office: [phone]  Clinic MD: Oneida Alar  12/17/2016, 12:08 PM

## 2017-01-25 DIAGNOSIS — R39198 Other difficulties with micturition: Secondary | ICD-10-CM | POA: Diagnosis not present

## 2017-01-25 DIAGNOSIS — Z6833 Body mass index (BMI) 33.0-33.9, adult: Secondary | ICD-10-CM | POA: Diagnosis not present

## 2017-01-25 DIAGNOSIS — Z1389 Encounter for screening for other disorder: Secondary | ICD-10-CM | POA: Diagnosis not present

## 2017-01-25 DIAGNOSIS — E6609 Other obesity due to excess calories: Secondary | ICD-10-CM | POA: Diagnosis not present

## 2017-01-25 DIAGNOSIS — R339 Retention of urine, unspecified: Secondary | ICD-10-CM | POA: Diagnosis not present

## 2017-01-26 ENCOUNTER — Emergency Department (HOSPITAL_COMMUNITY)
Admission: EM | Admit: 2017-01-26 | Discharge: 2017-01-26 | Disposition: A | Discharge status: Home / Self Care | Payer: Medicare Other | Attending: Emergency Medicine | Admitting: Emergency Medicine

## 2017-01-26 ENCOUNTER — Encounter (HOSPITAL_COMMUNITY): Payer: Self-pay

## 2017-01-26 DIAGNOSIS — Z7982 Long term (current) use of aspirin: Secondary | ICD-10-CM | POA: Diagnosis not present

## 2017-01-26 DIAGNOSIS — R339 Retention of urine, unspecified: Secondary | ICD-10-CM | POA: Diagnosis present

## 2017-01-26 DIAGNOSIS — N390 Urinary tract infection, site not specified: Secondary | ICD-10-CM | POA: Diagnosis not present

## 2017-01-26 DIAGNOSIS — Z87891 Personal history of nicotine dependence: Secondary | ICD-10-CM | POA: Diagnosis not present

## 2017-01-26 DIAGNOSIS — Z951 Presence of aortocoronary bypass graft: Secondary | ICD-10-CM | POA: Insufficient documentation

## 2017-01-26 DIAGNOSIS — I2510 Atherosclerotic heart disease of native coronary artery without angina pectoris: Secondary | ICD-10-CM | POA: Insufficient documentation

## 2017-01-26 DIAGNOSIS — Z8549 Personal history of malignant neoplasm of other male genital organs: Secondary | ICD-10-CM | POA: Insufficient documentation

## 2017-01-26 DIAGNOSIS — Z79899 Other long term (current) drug therapy: Secondary | ICD-10-CM | POA: Insufficient documentation

## 2017-01-26 LAB — I-STAT CHEM 8, ED
BUN: 60 mg/dL — AB (ref 6–20)
CALCIUM ION: 1.09 mmol/L — AB (ref 1.15–1.40)
CHLORIDE: 112 mmol/L — AB (ref 101–111)
Creatinine, Ser: 1.8 mg/dL — ABNORMAL HIGH (ref 0.61–1.24)
Glucose, Bld: 97 mg/dL (ref 65–99)
HCT: 37 % — ABNORMAL LOW (ref 39.0–52.0)
Hemoglobin: 12.6 g/dL — ABNORMAL LOW (ref 13.0–17.0)
Potassium: 5.6 mmol/L — ABNORMAL HIGH (ref 3.5–5.1)
SODIUM: 142 mmol/L (ref 135–145)
TCO2: 23 mmol/L (ref 0–100)

## 2017-01-26 LAB — URINALYSIS, ROUTINE W REFLEX MICROSCOPIC
Bilirubin Urine: NEGATIVE
GLUCOSE, UA: NEGATIVE mg/dL
Ketones, ur: 5 mg/dL — AB
LEUKOCYTES UA: NEGATIVE
NITRITE: NEGATIVE
Protein, ur: 100 mg/dL — AB
SPECIFIC GRAVITY, URINE: 1.012 (ref 1.005–1.030)
pH: 8 (ref 5.0–8.0)

## 2017-01-26 MED ORDER — CEPHALEXIN 500 MG PO CAPS: 500.0000 mg | ORAL_CAPSULE | Freq: Four times a day (QID) | ORAL | 0 refills | Status: DC

## 2017-01-26 NOTE — ED Triage Notes (Addendum)
Pt reports he has to self catherize himself 3 times daily. Reports that he has had difficulty with inserting catheter and draining bladder. States he can sit on toilet at time and void. Sent by [facility]  Pt reports has hx of penile cancer with penectomy

## 2017-01-26 NOTE — ED Provider Notes (Signed)
[facility] DEPT Provider Note   CSN: [id number] Arrival date & time: 01/26/17  1034     History   Chief Complaint Chief Complaint  Patient presents with  . self cath problems    HPI Caleb [name] is a 83 y.o. male.  HPI  Pt was seen at 1140. Per pt, c/o gradual onset and persistence of multiple intermittent episodes of being "unable to catheterize myself" for the past 7 to 10 days. Pt states he has tried using his 41F and 7F catheters without change. States he will catheterize himself, not get any urine return, but then sit on the commode and urinate. Pt is unsure if he is fully emptying his bladder. Pt denies hematuria, no dysuria, no drainage, no fevers, no abd pain, no N/V/D, no flank pain, no pain.    Uro: Dr. Jeffie Pollock Past Medical History:  Diagnosis Date  . Coronary artery disease   . Hyperlipidemia   . Hyperlipidemia   . Obesity   . Penile cancer ([place])   . Swelling of extremity    Left Leg    Patient Active Problem List   Diagnosis Date Noted  . Scalp hematoma 01/13/2016  . Self-catheterizes urinary bladder 01/13/2016  . Pneumothorax, left 01/12/2016  . Atypical chest pain 12/02/2015  . Cellulitis 05/17/2014  . Pain in limb- Slight pain- Left Leg 05/03/2014  . Discoloration of skin-Left Leg 05/03/2014  . Stasis edema with ulcer ([place]) 08/17/2013  . Peripheral vascular disease, unspecified 07/13/2013  . Swelling of limb 07/13/2013  . Coronary artery disease 06/07/2013  . Left upper extremity numbness 06/07/2013  . Hyperlipidemia   . Knee pain 08/11/2011    Past Surgical History:  Procedure Laterality Date  . APPENDECTOMY    . BACK SURGERY    . CHOLECYSTECTOMY    . CORONARY ANGIOPLASTY  11/2005   RCA PCI AND STENTING WITH A CYPHER DES  . CORONARY ARTERY BYPASS GRAFT    . HIATAL HERNIA REPAIR    . lymph node removal    . NM MYOCAR MULTIPLE W/SPECT  11/26/2009   EF 62%. NORMAL MYOCARDIAL PERFUSION STUDY.  . TRANSTHORACIC ECHOCARDIOGRAM  12/22/2005    MILD TO MOD AORTIC SCLEROSIS W/O STENOSIS. MILD TO MODERATE MITRAL CALCIFICATION. LA- MILDLY DILATED.       Home Medications    Prior to Admission medications   Medication Sig Start Date End Date Taking? Authorizing Provider  acetaminophen (TYLENOL) 325 MG tablet Take 1-2 tablets (325-650 mg total) by mouth every 6 (six) hours as needed for fever, headache, mild pain or moderate pain. 01/14/16  Yes Nat Christen, PA-C  Ascorbic Acid (VITAMIN C) 1000 MG tablet Take 1,000 mg by mouth daily.    Yes Historical Provider, MD  aspirin EC 81 MG tablet Take 81 mg by mouth daily.   Yes Historical Provider, MD  Calcium Carbonate-Vitamin D (CALCIUM-D PO) Take 1 tablet by mouth daily.   Yes Historical Provider, MD  clopidogrel (PLAVIX) 75 MG tablet Take 1 tablet by mouth  daily 08/07/16  Yes Lorretta Harp, MD  magnesium hydroxide (MILK OF MAGNESIA) 400 MG/5ML suspension Take 30 mLs by mouth daily as needed for mild constipation.   Yes Historical Provider, MD  metoprolol tartrate (LOPRESSOR) 25 MG tablet Take 1 tablet by mouth  twice a day 08/07/16  Yes Lorretta Harp, MD  Multiple Vitamin (MULTIVITAMIN WITH MINERALS) TABS tablet Take 1 tablet by mouth daily. Centrum Silver   Yes Historical Provider, MD  Multiple Vitamins-Minerals (OCUVITE  PRESERVISION PO) Take 1 tablet by mouth daily as needed (For eye health).   Yes Historical Provider, MD  Nystatin [facility]) 100000 UNIT/GM POWD Apply topically to affected area(s) twice daily 02/24/16  Yes Historical Provider, MD  polyethylene glycol powder (GLYCOLAX/MIRALAX) powder Take 17 g by mouth daily as needed for mild constipation or moderate constipation.   Yes Historical Provider, MD  simvastatin (ZOCOR) 20 MG tablet Take 1 tablet by mouth  every night at bedtime 08/07/16  Yes Lorretta Harp, MD    Family History Family History  Problem Relation Age of Onset  . Cancer Father   . Cancer Sister   . Cancer Brother   . Cancer Sister   . Cancer       Social History Social History  Substance Use Topics  . Smoking status: Former Smoker    Types: Cigarettes    Quit date: 07/14/1983  . Smokeless tobacco: Never Used  . Alcohol use No     Allergies   Patient has no known allergies.   Review of Systems Review of Systems ROS: Statement: All systems negative except as marked or noted in the HPI; Constitutional: Negative for fever and chills. ; ; Eyes: Negative for eye pain, redness and discharge. ; ; ENMT: Negative for ear pain, hoarseness, nasal congestion, sinus pressure and sore throat. ; ; Cardiovascular: Negative for chest pain, palpitations, diaphoresis, dyspnea and peripheral edema. ; ; Respiratory: Negative for cough, wheezing and stridor. ; ; Gastrointestinal: Negative for nausea, vomiting, diarrhea, abdominal pain, blood in stool, hematemesis, jaundice and rectal bleeding. . ; ; Genitourinary: +difficulty catheterizing self. Negative for dysuria, flank pain and hematuria. ; ; Genital:  No penile drainage or rash, no testicular pain or swelling, no scrotal rash or swelling. ;; Musculoskeletal: Negative for back pain and neck pain. Negative for swelling and trauma.; ; Skin: Negative for pruritus, rash, abrasions, blisters, bruising and skin lesion.; ; Neuro: Negative for headache, lightheadedness and neck stiffness. Negative for weakness, altered level of consciousness, altered mental status, extremity weakness, paresthesias, involuntary movement, seizure and syncope.       Physical Exam Updated Vital Signs BP 126/67   Pulse 93   Temp 98.5 F (36.9 C) (Oral)   Resp 16   Ht 6' (1.829 m)   Wt 252 lb (114.3 kg)   SpO2 93%   BMI 34.18 kg/m   Physical Exam 1145: Physical examination:  Nursing notes reviewed; Vital signs and O2 SAT reviewed;  Constitutional: Well developed, Well nourished, Well hydrated, In no acute distress; Head:  Normocephalic, atraumatic; Eyes: EOMI, PERRL, No scleral icterus; ENMT: Mouth and pharynx  normal, Mucous membranes moist; Neck: Supple, Full range of motion, No lymphadenopathy; Cardiovascular: Regular rate and rhythm, No gallop; Respiratory: Breath sounds clear & equal bilaterally, No wheezes.  Speaking full sentences with ease, Normal respiratory effort/excursion; Chest: Nontender, Movement normal; Abdomen: Soft, Nontender, Nondistended, Normal bowel sounds; Genitourinary: No CVA tenderness; Extremities: Pulses normal, No tenderness, No edema, No calf edema or asymmetry.; Neuro: AA&Ox3, Major CN grossly intact.  Speech clear. No gross focal motor or sensory deficits in extremities.; Skin: Color normal, Warm, Dry.   ED Treatments / Results  Labs (all labs ordered are listed, but only abnormal results are displayed)   EKG  EKG Interpretation None       Radiology No results found.  Procedures Procedures (including critical care time)  Medications Ordered in ED Medications - No data to display   Initial Impression / Assessment and Plan /  ED Course  I have reviewed the triage vital signs and the nursing notes.  Pertinent labs & imaging results that were available during my care of the patient were reviewed by me and considered in my medical decision making (see chart for details).  MDM Reviewed: previous chart, nursing note and vitals Reviewed previous: labs Interpretation: labs   Results for orders placed or performed during the hospital encounter of 01/26/17  Urinalysis, Routine w reflex microscopic  Result Value Ref Range   Color, Urine AMBER (A) YELLOW   APPearance CLOUDY (A) CLEAR   Specific Gravity, Urine 1.012 1.005 - 1.030   pH 8.0 5.0 - 8.0   Glucose, UA NEGATIVE NEGATIVE mg/dL   Hgb urine dipstick SMALL (A) NEGATIVE   Bilirubin Urine NEGATIVE NEGATIVE   Ketones, ur 5 (A) NEGATIVE mg/dL   Protein, ur 100 (A) NEGATIVE mg/dL   Nitrite NEGATIVE NEGATIVE   Leukocytes, UA NEGATIVE NEGATIVE   RBC / HPF 6-30 0 - 5 RBC/hpf   WBC, UA 6-30 0 - 5 WBC/hpf    Bacteria, UA MANY (A) NONE SEEN   WBC Clumps PRESENT    Mucous PRESENT    Uric Acid Crys, UA PRESENT   I-stat Chem 8, ED  Result Value Ref Range   Sodium 142 135 - 145 mmol/L   Potassium 5.6 (H) 3.5 - 5.1 mmol/L   Chloride 112 (H) 101 - 111 mmol/L   BUN 60 (H) 6 - 20 mg/dL   Creatinine, Ser 1.80 (H) 0.61 - 1.24 mg/dL   Glucose, Bld 97 65 - 99 mg/dL   Calcium, Ion 1.09 (L) 1.15 - 1.40 mmol/L   TCO2 23 0 - 100 mmol/L   Hemoglobin 12.6 (L) 13.0 - 17.0 g/dL   HCT 37.0 (L) 39.0 - 52.0 %    1405:  Foley placed with 2072ml output. BUN/Cr mildly elevated from baseline; likely due to outflow obstruction. +UTI, UC pending; will tx with keflex. Will maintain foley, f/u Uro MD. Pt make aware he will also need PMD f/u as outpt to re-check BUN/Cr. Pt wants to go home now. Dx and testing d/w pt.  Questions answered.  Verb understanding, agreeable to d/c home with outpt f/u.    Final Clinical Impressions(s) / ED Diagnoses   Final diagnoses:  None    New Prescriptions New Prescriptions   No medications on file     Francine Graven, DO 01/30/17 1329

## 2017-01-26 NOTE — Discharge Instructions (Signed)
Take the prescription as directed.  Call your regular Urologist today to schedule a follow up appointment within the next 3 days.  Return to the Emergency Department immediately sooner if worsening.

## 2017-01-28 LAB — URINE CULTURE

## 2017-01-29 DIAGNOSIS — R338 Other retention of urine: Secondary | ICD-10-CM | POA: Diagnosis not present

## 2017-01-29 DIAGNOSIS — N312 Flaccid neuropathic bladder, not elsewhere classified: Secondary | ICD-10-CM | POA: Diagnosis not present

## 2017-02-17 DIAGNOSIS — H353222 Exudative age-related macular degeneration, left eye, with inactive choroidal neovascularization: Secondary | ICD-10-CM | POA: Diagnosis not present

## 2017-02-17 DIAGNOSIS — H353112 Nonexudative age-related macular degeneration, right eye, intermediate dry stage: Secondary | ICD-10-CM | POA: Diagnosis not present

## 2017-03-12 ENCOUNTER — Ambulatory Visit: Payer: Medicare Other | Admitting: Urology

## 2017-04-01 DIAGNOSIS — H353122 Nonexudative age-related macular degeneration, left eye, intermediate dry stage: Secondary | ICD-10-CM | POA: Diagnosis not present

## 2017-04-01 DIAGNOSIS — H353211 Exudative age-related macular degeneration, right eye, with active choroidal neovascularization: Secondary | ICD-10-CM | POA: Diagnosis not present

## 2017-05-13 DIAGNOSIS — H353122 Nonexudative age-related macular degeneration, left eye, intermediate dry stage: Secondary | ICD-10-CM | POA: Diagnosis not present

## 2017-05-13 DIAGNOSIS — H353211 Exudative age-related macular degeneration, right eye, with active choroidal neovascularization: Secondary | ICD-10-CM | POA: Diagnosis not present

## 2017-05-19 DIAGNOSIS — Z08 Encounter for follow-up examination after completed treatment for malignant neoplasm: Secondary | ICD-10-CM | POA: Diagnosis not present

## 2017-05-19 DIAGNOSIS — Z8549 Personal history of malignant neoplasm of other male genital organs: Secondary | ICD-10-CM | POA: Diagnosis not present

## 2017-05-19 DIAGNOSIS — Z87891 Personal history of nicotine dependence: Secondary | ICD-10-CM | POA: Diagnosis not present

## 2017-05-19 DIAGNOSIS — Z9079 Acquired absence of other genital organ(s): Secondary | ICD-10-CM | POA: Diagnosis not present

## 2017-05-19 DIAGNOSIS — C609 Malignant neoplasm of penis, unspecified: Secondary | ICD-10-CM | POA: Diagnosis not present

## 2017-05-27 DIAGNOSIS — L258 Unspecified contact dermatitis due to other agents: Secondary | ICD-10-CM | POA: Diagnosis not present

## 2017-06-23 DIAGNOSIS — Z6834 Body mass index (BMI) 34.0-34.9, adult: Secondary | ICD-10-CM | POA: Diagnosis not present

## 2017-06-23 DIAGNOSIS — Z125 Encounter for screening for malignant neoplasm of prostate: Secondary | ICD-10-CM | POA: Diagnosis not present

## 2017-06-23 DIAGNOSIS — Z1389 Encounter for screening for other disorder: Secondary | ICD-10-CM | POA: Diagnosis not present

## 2017-06-23 DIAGNOSIS — I10 Essential (primary) hypertension: Secondary | ICD-10-CM | POA: Diagnosis not present

## 2017-06-23 DIAGNOSIS — C609 Malignant neoplasm of penis, unspecified: Secondary | ICD-10-CM | POA: Diagnosis not present

## 2017-06-23 DIAGNOSIS — Z0000 Encounter for general adult medical examination without abnormal findings: Secondary | ICD-10-CM | POA: Diagnosis not present

## 2017-06-23 DIAGNOSIS — I2510 Atherosclerotic heart disease of native coronary artery without angina pectoris: Secondary | ICD-10-CM | POA: Diagnosis not present

## 2017-06-23 DIAGNOSIS — E782 Mixed hyperlipidemia: Secondary | ICD-10-CM | POA: Diagnosis not present

## 2017-06-24 DIAGNOSIS — H353122 Nonexudative age-related macular degeneration, left eye, intermediate dry stage: Secondary | ICD-10-CM | POA: Diagnosis not present

## 2017-06-24 DIAGNOSIS — H353211 Exudative age-related macular degeneration, right eye, with active choroidal neovascularization: Secondary | ICD-10-CM | POA: Diagnosis not present

## 2017-07-01 ENCOUNTER — Ambulatory Visit (INDEPENDENT_AMBULATORY_CARE_PROVIDER_SITE_OTHER): Payer: Medicare Other | Admitting: Otolaryngology

## 2017-07-01 DIAGNOSIS — D3709 Neoplasm of uncertain behavior of other specified sites of the oral cavity: Secondary | ICD-10-CM

## 2017-07-29 DIAGNOSIS — H353211 Exudative age-related macular degeneration, right eye, with active choroidal neovascularization: Secondary | ICD-10-CM | POA: Diagnosis not present

## 2017-08-23 DIAGNOSIS — H353211 Exudative age-related macular degeneration, right eye, with active choroidal neovascularization: Secondary | ICD-10-CM

## 2017-08-23 HISTORY — DX: Exudative age-related macular degeneration, right eye, with active choroidal neovascularization: H35.3211

## 2017-09-03 ENCOUNTER — Other Ambulatory Visit: Payer: Self-pay | Admitting: Cardiovascular Disease

## 2017-09-23 DIAGNOSIS — H353211 Exudative age-related macular degeneration, right eye, with active choroidal neovascularization: Secondary | ICD-10-CM | POA: Diagnosis not present

## 2017-09-24 ENCOUNTER — Encounter: Payer: Self-pay | Admitting: Family

## 2017-09-24 ENCOUNTER — Ambulatory Visit (INDEPENDENT_AMBULATORY_CARE_PROVIDER_SITE_OTHER): Payer: Medicare Other | Admitting: Family

## 2017-09-24 VITALS — BP 131/82 | HR 77 | Temp 97.3°F | Resp 20 | Ht 72.0 in | Wt 253.0 lb

## 2017-09-24 DIAGNOSIS — I872 Venous insufficiency (chronic) (peripheral): Secondary | ICD-10-CM

## 2017-09-24 DIAGNOSIS — R609 Edema, unspecified: Secondary | ICD-10-CM

## 2017-09-24 NOTE — Progress Notes (Signed)
CC: pt concern re swelling in his right lower leg, hx of chronic Venous Insufficiency  History of Present Illness  Caleb [name] is a 83 y.o. ([birth year]) male male whom Dr. Oneida Alar has been following with swelling of legs.   He returns today with swelling and discoloration of the right leg that has not worsened.  Dr. Oneida Alar last evaluated pt on 06-06-15 after recent CT scan of abdomen and pelvis. CT abdomen and pelvis showed no aneurysmal dilation of the right common femoral vein. There was no obvious lymphadenopathy. There was no obstruction of the vein.  I last evaluated pt on 12-17-16. At that time he had bilateral leg chronic venous insufficiency.  I discussed with Dr. Oneida Alar pt HPI and physical exam results. There was no evidence of cellulitis, pt did not c/o pain in his legs. He admited that he was not elevating his legs, apparently legs are mostly dependent during the day. I instructed pt to elevate his feet above his slightly flexed knees overnight and 3-4x/day for 20 minutes. Based on the patient's HPIA and physical examination, and after discussing with Dr. Oneida Alar, I offered the patient: follow up with [place] prn, with his PCP for suspected cellulitis.  Early in 2017 he was diagnosed with penile cancer and underwent a penectomy in February as well as lymph node dissection of both groins.  He does wear compression stockings at times.He does have a hx of laser ablation at [place] approximately 2010.He has never had a DVT.  He has chronic lower extremity swelling. Most likely multifactorial from lymphadenectomy as well as prior history of deep vein reflux. Mainstay of therapy will be compression  And elevation of legs long-term. The patient was to follow-up on as-needed basis.  He has hx of cardiac stent and is on Plavix. He had 2 falls in 2014 and injured his right knee. He was seen by Dr. Theda Sers for this. His knee checked out okay. He was referred here by Dr.  Theda Sers for further evaluation of his leg swelling. Patient previously had what sounds like laser ablation of his right and left greater saphenous veins by Dr. Eilleen Kempf.  He had had chronic staining of the skin in both legs. He had some itching and weeping in the right leg. He denies claudication or rest pain.  Pt denies history of stroke or TIA. He does have compression stockings but has not been very compliant with these. Other medical problems include hyperlipidemia, coronary disease, obesity, hyperlipidemia all of which are currently stable. He did have evidence of some incompetence of the right lesser saphenous vein on previous office visit.   Left Leg venous stasis ulcer has healed.  He states he is not elevating his legs when not walking and does not walk much, no reason, no barriers.    He palsy bridge twice/week, and for 3 hours his feet are dependent.    Past Medical History:  Diagnosis Date  . Coronary artery disease   . Hyperlipidemia   . Hyperlipidemia   . Obesity   . Penile cancer ([place])   . Swelling of extremity    Left Leg    Social History Social History  Substance Use Topics  . Smoking status: Former Smoker    Types: Cigarettes    Quit date: 07/14/1983  . Smokeless tobacco: Never Used  . Alcohol use No    Family History Family History  Problem Relation Age of Onset  . Cancer Father   . Cancer Sister   .  Cancer Brother   . Cancer Sister   . Cancer Unknown     Surgical History Past Surgical History:  Procedure Laterality Date  . APPENDECTOMY    . BACK SURGERY    . CHOLECYSTECTOMY    . CORONARY ANGIOPLASTY  11/2005   RCA PCI AND STENTING WITH A CYPHER DES  . CORONARY ARTERY BYPASS GRAFT    . HIATAL HERNIA REPAIR    . lymph node removal    . NM MYOCAR MULTIPLE W/SPECT  11/26/2009   EF 62%. NORMAL MYOCARDIAL PERFUSION STUDY.  . TRANSTHORACIC ECHOCARDIOGRAM  12/22/2005   MILD TO MOD AORTIC SCLEROSIS W/O STENOSIS. MILD TO MODERATE MITRAL  CALCIFICATION. LA- MILDLY DILATED.    No Known Allergies  Current Outpatient Prescriptions  Medication Sig Dispense Refill  . acetaminophen (TYLENOL) 325 MG tablet Take 1-2 tablets (325-650 mg total) by mouth every 6 (six) hours as needed for fever, headache, mild pain or moderate pain.    . Ascorbic Acid (VITAMIN C) 1000 MG tablet Take 1,000 mg by mouth daily.     Marland Kitchen aspirin EC 81 MG tablet Take 81 mg by mouth daily.    . Calcium Carbonate-Vitamin D (CALCIUM-D PO) Take 1 tablet by mouth daily.    . clopidogrel (PLAVIX) 75 MG tablet TAKE 1 TABLET BY MOUTH  DAILY 90 tablet 0  . magnesium hydroxide (MILK OF MAGNESIA) 400 MG/5ML suspension Take 30 mLs by mouth daily as needed for mild constipation.    . metoprolol tartrate (LOPRESSOR) 25 MG tablet TAKE 1 TABLET BY MOUTH  TWICE A DAY 180 tablet 0  . Multiple Vitamin (MULTIVITAMIN WITH MINERALS) TABS tablet Take 1 tablet by mouth daily. Centrum Silver    . Multiple Vitamins-Minerals (OCUVITE PRESERVISION PO) Take 1 tablet by mouth daily as needed (For eye health).    . Nystatin (NYAMYC) 100000 UNIT/GM POWD Apply topically to affected area(s) twice daily  1  . polyethylene glycol powder (GLYCOLAX/MIRALAX) powder Take 17 g by mouth daily as needed for mild constipation or moderate constipation.    . simvastatin (ZOCOR) 20 MG tablet TAKE 1 TABLET BY MOUTH  EVERY NIGHT AT BEDTIME 90 tablet 0  . cephALEXin (KEFLEX) 500 MG capsule Take 1 capsule (500 mg total) by mouth 4 (four) times daily. (Patient not taking: Reported on 09/24/2017) 40 capsule 0   No current facility-administered medications for this visit.     REVIEW OF SYSTEMS: see HPI for pertinent positives and negatives   Physical Examination  Vitals:   09/24/17 0926  BP: 131/82  Pulse: 77  Resp: 20  Temp: (!) 97.3 F (36.3 C)  TempSrc: Oral  SpO2: 96%  Weight: 253 lb (114.8 kg)  Height: 6' (1.829 m)   Body mass index is 34.31 kg/m.  General: The patient appears his stated age,  obese male.  HEENT: No gross abnormalities  Pulmonary: Respirations are non-labored  Abdomen: Soft and non-tender with normal pitched bowel sounds.  Musculoskeletal:There are no major deformities.  Neurologic:No focal weakness or paresthesias are detected,  Skin:There is venous stasis dermatitis on both lower legs, extensive staining in both lower legs, no ulcers. Right lower leg with 3+ non pitting edema and trace pitting edema,  no evidence of cellulitis. Left lower leg with trace pitting edema, less stasis dermatitis than the right leg.  Psychiatric:The patient has normal affect.  Cardiovascular:There is a regular rate and rhythm.   Vascular: Vessel  Right  Left   Radial  Palpable  Palpable   Aorta  Not palpable  N/A   Popliteal  Not palpable  Not palpable   PT  1+ Palpable  1+ Palpable   DP  2+ Palpable  2+ Palpable      DATA  Lower Extremity Venous Duplex Reflux Evaluation 05/23/15: 1.No evidence of DVT or superficial venous thrombophlebitis in the RLE 2.Right saphenous vein could not be identified 3.Evidence of deep venous reflux in the right common femoral, femoral and PT veins.Peroneal veins not visualized. Non-vascularized fluid filled cystic structure in the proximal mid thigh measuring 1.7cm x 2.2cm. **Significant extravasation is observed in the right groin.Flow is pulsatile with velocities >400cm/s but appear to communicate with the common femoral vein.(Does not look like a fistula; vein itself is normal and compressible).     Right lower leg      Medical Decision Making  OSHER OETTINGER is a 83 y.o. male who presents with: bilateral leg chronic venous insufficiency, right more so than left.  His pedal pulses are palpable bilaterally.  I discussed with Dr. Donzetta Matters pt HPI and physical exam results, no need for unna boot unless pt develops a wound. Pt states there has been no change in the skin of either lower legs. He has no fever or chills.  He  has compression hose that he is able to donn, but admits that he does not use them very much. I demonstrated how he needs to elevate his legs to minimize swelling, he has the Doctor Lounge pillow and uses it for 3 hours/night; see Patient Instructions.   Based on the patient's vascular studies and examination, I have offered the patient to notify us if he develops an open wound on his leg or legs, we would then start unna boot compression dressings.   His PCP is to address any cellulitis that he may develop.    Thank you for allowing [place] to participate in this patient's care.  Chari Parmenter, Sharmon Leyden, RN, MSN, FNP-C [facility] Office: [phone]  09/24/2017, 9:37 AM  Clinic MD: Chen/Cain

## 2017-09-24 NOTE — Patient Instructions (Addendum)
To decrease swelling in your feet and legs: Elevate feet above slightly bent knees, feet above heart, overnight and 3-4 times per day for 20 minutes.     Chronic Venous Insufficiency Chronic venous insufficiency, also called venous stasis, is a condition that prevents blood from being pumped effectively through the veins in your legs. Blood may no longer be pumped effectively from the legs back to the heart. This condition can range from mild to severe. With proper treatment, you should be able to continue with an active life. What are the causes? Chronic venous insufficiency occurs when the vein walls become stretched, weakened, or damaged, or when valves within the vein are damaged. Some common causes of this include:  High blood pressure inside the veins (venous hypertension).  Increased blood pressure in the leg veins from long periods of sitting or standing.  A blood clot that blocks blood flow in a vein (deep vein thrombosis, DVT).  Inflammation of a vein (phlebitis) that causes a blood clot to form.  Tumors in the pelvis that cause blood to back up.  What increases the risk? The following factors may make you more likely to develop this condition:  Having a family history of this condition.  Obesity.  Pregnancy.  Living without enough physical activity or exercise (sedentary lifestyle).  Smoking.  Having a job that requires long periods of standing or sitting in one place.  Being a certain age. Women in their 12s and 5s and men in their 52s are more likely to develop this condition.  What are the signs or symptoms? Symptoms of this condition include:  Veins that are enlarged, bulging, or twisted (varicose veins).  Skin breakdown or ulcers.  Reddened or discolored skin on the front of the leg.  Brown, smooth, tight, and painful skin just above the ankle, usually on the inside of the leg (lipodermatosclerosis).  Swelling.  How is this diagnosed? This  condition may be diagnosed based on:  Your medical history.  A physical exam.  Tests, such as: ? A procedure that creates an image of a blood vessel and nearby organs and provides information about blood flow through the blood vessel (duplex ultrasound). ? A procedure that tests blood flow (plethysmography). ? A procedure to look at the veins using X-ray and dye (venogram).  How is this treated? The goals of treatment are to help you return to an active life and to minimize pain or disability. Treatment depends on the severity of your condition, and it may include:  Wearing compression stockings. These can help relieve symptoms and help prevent your condition from getting worse. However, they do not cure the condition.  Sclerotherapy. This is a procedure involving an injection of a material that "dissolves" damaged veins.  Surgery. This may involve: ? Removing a diseased vein (vein stripping). ? Cutting off blood flow through the vein (laser ablation surgery). ? Repairing a valve.  Follow these instructions at home:  Wear compression stockings as told by your health care provider. These stockings help to prevent blood clots and reduce swelling in your legs.  Take over-the-counter and prescription medicines only as told by your health care provider.  Stay active by exercising, walking, or doing different activities. Ask your health care provider what activities are safe for you and how much exercise you need.  Drink enough fluid to keep your urine clear or pale yellow.  Do not use any products that contain nicotine or tobacco, such as cigarettes and e-cigarettes. If you need  help quitting, ask your health care provider.  Keep all follow-up visits as told by your health care provider. This is important. Contact a health care provider if:  You have redness, swelling, or more pain in the affected area.  You see a red streak or line that extends up or down from the affected  area.  You have skin breakdown or a loss of skin in the affected area, even if the breakdown is small.  You get an injury in the affected area. Get help right away if:  You get an injury and an open wound in the affected area.  You have severe pain that does not get better with medicine.  You have sudden numbness or weakness in the foot or ankle below the affected area, or you have trouble moving your foot or ankle.  You have a fever and you have worse or persistent symptoms.  You have chest pain.  You have shortness of breath. Summary  Chronic venous insufficiency, also called venous stasis, is a condition that prevents blood from being pumped effectively through the veins in your legs.  Chronic venous insufficiency occurs when the vein walls become stretched, weakened, or damaged, or when valves within the vein are damaged.  Treatment for this condition depends on how severe your condition is, and it may involve wearing compression stockings or having a procedure.  Make sure you stay active by exercising, walking, or doing different activities. Ask your health care provider what activities are safe for you and how much exercise you need. This information is not intended to replace advice given to you by your health care provider. Make sure you discuss any questions you have with your health care provider. Document Released: 04/19/2007 Document Revised: 11/02/2016 Document Reviewed: 11/02/2016 Elsevier Interactive Patient Education  2017 [employer].

## 2017-10-06 DIAGNOSIS — Z23 Encounter for immunization: Secondary | ICD-10-CM | POA: Diagnosis not present

## 2017-10-25 ENCOUNTER — Other Ambulatory Visit: Payer: Self-pay | Admitting: Cardiovascular Disease

## 2017-10-28 ENCOUNTER — Ambulatory Visit (INDEPENDENT_AMBULATORY_CARE_PROVIDER_SITE_OTHER): Payer: Medicare Other | Admitting: Otolaryngology

## 2017-11-04 DIAGNOSIS — H353212 Exudative age-related macular degeneration, right eye, with inactive choroidal neovascularization: Secondary | ICD-10-CM | POA: Diagnosis not present

## 2017-11-17 DIAGNOSIS — Z7982 Long term (current) use of aspirin: Secondary | ICD-10-CM | POA: Diagnosis not present

## 2017-11-17 DIAGNOSIS — C609 Malignant neoplasm of penis, unspecified: Secondary | ICD-10-CM | POA: Diagnosis not present

## 2017-11-17 DIAGNOSIS — Z87891 Personal history of nicotine dependence: Secondary | ICD-10-CM | POA: Diagnosis not present

## 2017-11-17 DIAGNOSIS — Z79899 Other long term (current) drug therapy: Secondary | ICD-10-CM | POA: Diagnosis not present

## 2018-01-04 DIAGNOSIS — Z1389 Encounter for screening for other disorder: Secondary | ICD-10-CM | POA: Diagnosis not present

## 2018-01-04 DIAGNOSIS — N342 Other urethritis: Secondary | ICD-10-CM | POA: Diagnosis not present

## 2018-01-04 DIAGNOSIS — Z6835 Body mass index (BMI) 35.0-35.9, adult: Secondary | ICD-10-CM | POA: Diagnosis not present

## 2018-01-04 DIAGNOSIS — E6609 Other obesity due to excess calories: Secondary | ICD-10-CM | POA: Diagnosis not present

## 2018-01-07 ENCOUNTER — Emergency Department (HOSPITAL_COMMUNITY)
Admission: EM | Admit: 2018-01-07 | Discharge: 2018-01-07 | Disposition: A | Discharge status: Home / Self Care | Payer: Medicare Other | Attending: Emergency Medicine | Admitting: Emergency Medicine

## 2018-01-07 ENCOUNTER — Other Ambulatory Visit: Payer: Self-pay

## 2018-01-07 ENCOUNTER — Encounter (HOSPITAL_COMMUNITY): Payer: Self-pay | Admitting: *Deleted

## 2018-01-07 DIAGNOSIS — Z87891 Personal history of nicotine dependence: Secondary | ICD-10-CM | POA: Diagnosis not present

## 2018-01-07 DIAGNOSIS — Z79899 Other long term (current) drug therapy: Secondary | ICD-10-CM | POA: Diagnosis not present

## 2018-01-07 DIAGNOSIS — I2510 Atherosclerotic heart disease of native coronary artery without angina pectoris: Secondary | ICD-10-CM | POA: Insufficient documentation

## 2018-01-07 DIAGNOSIS — Z951 Presence of aortocoronary bypass graft: Secondary | ICD-10-CM | POA: Diagnosis not present

## 2018-01-07 DIAGNOSIS — Z955 Presence of coronary angioplasty implant and graft: Secondary | ICD-10-CM | POA: Diagnosis not present

## 2018-01-07 DIAGNOSIS — Z7902 Long term (current) use of antithrombotics/antiplatelets: Secondary | ICD-10-CM | POA: Diagnosis not present

## 2018-01-07 DIAGNOSIS — Z7982 Long term (current) use of aspirin: Secondary | ICD-10-CM | POA: Diagnosis not present

## 2018-01-07 DIAGNOSIS — K5909 Other constipation: Secondary | ICD-10-CM

## 2018-01-07 LAB — I-STAT CHEM 8, ED
BUN: 25 mg/dL — ABNORMAL HIGH (ref 6–20)
CHLORIDE: 107 mmol/L (ref 101–111)
Calcium, Ion: 1.14 mmol/L — ABNORMAL LOW (ref 1.15–1.40)
Creatinine, Ser: 1.2 mg/dL (ref 0.61–1.24)
GLUCOSE: 89 mg/dL (ref 65–99)
HCT: 37 % — ABNORMAL LOW (ref 39.0–52.0)
HEMOGLOBIN: 12.6 g/dL — AB (ref 13.0–17.0)
POTASSIUM: 4 mmol/L (ref 3.5–5.1)
Sodium: 141 mmol/L (ref 135–145)
TCO2: 22 mmol/L (ref 22–32)

## 2018-01-07 MED ORDER — LACTULOSE 10 GM/15ML PO SOLN
10.0000 g | Freq: Once | ORAL | Status: AC
  Administered 2018-01-07: 10 g via ORAL
  Filled 2018-01-07: qty 30

## 2018-01-07 MED ORDER — DOCUSATE SODIUM 100 MG PO CAPS
100.0000 mg | ORAL_CAPSULE | Freq: Once | ORAL | Status: AC
  Administered 2018-01-07: 100 mg via ORAL
  Filled 2018-01-07: qty 1

## 2018-01-07 MED ORDER — DOCUSATE SODIUM 100 MG PO CAPS: 100.0000 mg | ORAL_CAPSULE | Freq: Two times a day (BID) | ORAL | 0 refills | Status: DC

## 2018-01-07 NOTE — ED Provider Notes (Signed)
[facility] EMERGENCY DEPARTMENT Provider Note   CSN: [id number] Arrival date & time: 01/07/18  5573     History   Chief Complaint Chief Complaint  Patient presents with  . Constipation    HPI Caleb [name] is a 84 y.o. male.  Patient presents with constipation symptoms for the past 2 days. Last normal bowel movement was 2 days prior. Patient's had a few hard small stools. Patient has been taking over-the-counter laxatives with minimal response. Patient does self cath is currently on antibiotics for UTI. Patient has no dysuria. No fevers or chills. No abdominal pain or back pain.      Past Medical History:  Diagnosis Date  . Coronary artery disease   . Hyperlipidemia   . Hyperlipidemia   . Obesity   . Penile cancer ([place])   . Swelling of extremity    Left Leg    Patient Active Problem List   Diagnosis Date Noted  . Scalp hematoma 01/13/2016  . Self-catheterizes urinary bladder 01/13/2016  . Pneumothorax, left 01/12/2016  . Atypical chest pain 12/02/2015  . Cellulitis 05/17/2014  . Pain in limb- Slight pain- Left Leg 05/03/2014  . Discoloration of skin-Left Leg 05/03/2014  . Stasis edema with ulcer ([place]) 08/17/2013  . Peripheral vascular disease, unspecified ([place]) 07/13/2013  . Swelling of limb 07/13/2013  . Coronary artery disease 06/07/2013  . Left upper extremity numbness 06/07/2013  . Hyperlipidemia   . Knee pain 08/11/2011    Past Surgical History:  Procedure Laterality Date  . APPENDECTOMY    . BACK SURGERY    . CHOLECYSTECTOMY    . CORONARY ANGIOPLASTY  11/2005   RCA PCI AND STENTING WITH A CYPHER DES  . CORONARY ARTERY BYPASS GRAFT    . HIATAL HERNIA REPAIR    . lymph node removal    . NM MYOCAR MULTIPLE W/SPECT  11/26/2009   EF 62%. NORMAL MYOCARDIAL PERFUSION STUDY.  . TRANSTHORACIC ECHOCARDIOGRAM  12/22/2005   MILD TO MOD AORTIC SCLEROSIS W/O STENOSIS. MILD TO MODERATE MITRAL CALCIFICATION. LA- MILDLY DILATED.       Home Medications     Prior to Admission medications   Medication Sig Start Date End Date Taking? Authorizing Provider  acetaminophen (TYLENOL) 325 MG tablet Take 1-2 tablets (325-650 mg total) by mouth every 6 (six) hours as needed for fever, headache, mild pain or moderate pain. 01/14/16   Nat Christen, PA-C  Ascorbic Acid (VITAMIN C) 1000 MG tablet Take 1,000 mg by mouth daily.     [provider]  aspirin EC 81 MG tablet Take 81 mg by mouth daily.    [provider]  Calcium Carbonate-Vitamin D (CALCIUM-D PO) Take 1 tablet by mouth daily.    [provider]  cephALEXin (KEFLEX) 500 MG capsule Take 1 capsule (500 mg total) by mouth 4 (four) times daily. Patient not taking: Reported on 09/24/2017 01/26/17   Francine Graven, DO  clopidogrel (PLAVIX) 75 MG tablet TAKE 1 TABLET BY MOUTH  DAILY 10/25/17   Lorretta Harp, MD  docusate sodium (COLACE) 100 MG capsule Take 1 capsule (100 mg total) by mouth every 12 (twelve) hours. 01/07/18   Elnora Morrison, MD  magnesium hydroxide (MILK OF MAGNESIA) 400 MG/5ML suspension Take 30 mLs by mouth daily as needed for mild constipation.    [provider]  metoprolol tartrate (LOPRESSOR) 25 MG tablet TAKE 1 TABLET BY MOUTH  TWICE A DAY 10/25/17   Lorretta Harp, MD  Multiple Vitamin (MULTIVITAMIN  WITH MINERALS) TABS tablet Take 1 tablet by mouth daily. Centrum Silver    [provider]  Multiple Vitamins-Minerals (OCUVITE PRESERVISION PO) Take 1 tablet by mouth daily as needed (For eye health).    [provider]  Nystatin [facility]) 100000 UNIT/GM POWD Apply topically to affected area(s) twice daily 02/24/16   [provider]  polyethylene glycol powder (GLYCOLAX/MIRALAX) powder Take 17 g by mouth daily as needed for mild constipation or moderate constipation.    [provider]  simvastatin (ZOCOR) 20 MG tablet TAKE 1 TABLET BY MOUTH  EVERY NIGHT AT BEDTIME 10/25/17   Lorretta Harp, MD    Family  History Family History  Problem Relation Age of Onset  . Cancer Father   . Cancer Sister   . Cancer Brother   . Cancer Sister   . Cancer Unknown     Social History Social History   Tobacco Use  . Smoking status: Former Smoker    Types: Cigarettes    Last attempt to quit: 07/14/1983    Years since quitting: 34.5  . Smokeless tobacco: Never Used  Substance Use Topics  . Alcohol use: No  . Drug use: No     Allergies   Patient has no known allergies.   Review of Systems Review of Systems  Constitutional: Negative for chills and fever.  HENT: Negative for congestion.   Eyes: Negative for visual disturbance.  Respiratory: Negative for shortness of breath.   Cardiovascular: Negative for chest pain.  Gastrointestinal: Positive for constipation. Negative for abdominal pain and vomiting.  Genitourinary: Positive for difficulty urinating. Negative for dysuria and flank pain.  Musculoskeletal: Negative for back pain, neck pain and neck stiffness.  Skin: Negative for rash.  Neurological: Negative for light-headedness and headaches.     Physical Exam Updated Vital Signs BP 134/80 (BP Location: Left Arm)   Pulse 83   Temp 97.9 F (36.6 C) (Oral)   Resp 18   Ht 6' (1.829 m)   Wt 113.4 kg (250 lb)   SpO2 96%   BMI 33.91 kg/m   Physical Exam  Constitutional: He is oriented to person, place, and time. He appears well-developed and well-nourished.  HENT:  Head: Normocephalic and atraumatic.  Eyes: Conjunctivae are normal. Right eye exhibits no discharge. Left eye exhibits no discharge.  Neck: Normal range of motion. Neck supple. No tracheal deviation present.  Cardiovascular: Normal rate.  Pulmonary/Chest: Effort normal.  Abdominal: Soft. He exhibits no distension. There is no tenderness. There is no guarding.  Genitourinary:  Genitourinary Comments: No firm stool palpated, light brown stool no blood on rectal exam.  Musculoskeletal: He exhibits no edema.    Neurological: He is alert and oriented to person, place, and time.  Skin: Skin is warm. No rash noted.  Psychiatric: He has a normal mood and affect.  Nursing note and vitals reviewed.    ED Treatments / Results  Labs (all labs ordered are listed, but only abnormal results are displayed) Labs Reviewed  I-STAT CHEM 8, ED - Abnormal; Notable for the following components:      Result Value   BUN 25 (*)    Calcium, Ion 1.14 (*)    Hemoglobin 12.6 (*)    HCT 37.0 (*)    All other components within normal limits    EKG  EKG Interpretation None       Radiology No results found.  Procedures Procedures (including critical care time)  Medications Ordered in ED Medications  docusate  sodium (COLACE) capsule 100 mg (100 mg Oral Given 01/07/18 0857)  lactulose (CHRONULAC) 10 GM/15ML solution 10 g (10 g Oral Given 01/07/18 0857)     Initial Impression / Assessment and Plan / ED Course  I have reviewed the triage vital signs and the nursing notes.  Pertinent labs & imaging results that were available during my care of the patient were reviewed by me and considered in my medical decision making (see chart for details).    Patient presents clinically with constipation that has decreased his ability to self cath. No fevers or chills or systemic symptoms. Plan to check kidney function, add Colace to regular MiraLAX and follow-up closely with primary doctor. Patient has appointment today at 10:00.  Well on recheck, no pain, kidney fx okay.  Results and differential diagnosis were discussed with the patient/parent/guardian. Xrays were independently reviewed by myself.  Close follow up outpatient was discussed, comfortable with the plan.   Medications  docusate sodium (COLACE) capsule 100 mg (100 mg Oral Given 01/07/18 0857)  lactulose (CHRONULAC) 10 GM/15ML solution 10 g (10 g Oral Given 01/07/18 0857)    Vitals:   01/07/18 0804 01/07/18 0806 01/07/18 0932  BP:  (!) 142/80 134/80   Pulse:  74 83  Resp:  16 18  Temp:  97.9 F (36.6 C)   TempSrc:  Oral   SpO2:  95% 96%  Weight: 113.4 kg (250 lb)    Height: 6' (1.829 m)      Final diagnoses:  Other constipation     Final Clinical Impressions(s) / ED Diagnoses   Final diagnoses:  Other constipation    ED Discharge Orders        Ordered    docusate sodium (COLACE) 100 MG capsule  Every 12 hours     01/07/18 0848       Elnora Morrison, MD 01/07/18 [phone]

## 2018-01-07 NOTE — Discharge Instructions (Signed)
Continue miralax until stool soft and regular.   Use docusate sodium to soften stools.  If you were given medicines take as directed.  If you are on coumadin or contraceptives realize their levels and effectiveness is altered by many different medicines.  If you have any reaction (rash, tongues swelling, other) to the medicines stop taking and see a physician.    If your blood pressure was elevated in the ER make sure you follow up for management with a primary doctor or return for chest pain, shortness of breath or stroke symptoms.  Please follow up as directed and return to the ER or see a physician for new or worsening symptoms.  Thank you. Vitals:   01/07/18 0804 01/07/18 0806  BP:  (!) 142/80  Pulse:  74  Resp:  16  Temp:  97.9 F (36.6 C)  TempSrc:  Oral  SpO2:  95%  Weight: 113.4 kg (250 lb)   Height: 6' (1.829 m)

## 2018-01-07 NOTE — ED Triage Notes (Addendum)
Pt c/o constipation x couple of days. Pt reports his last normal BM was a few days ago. Pt reports he has been taking laxatives with only very small BMs since then. Pt also reports scrotal pain. Pt does self cath at home and is currently being treated for a UTI. Denies dysuria.

## 2018-01-09 ENCOUNTER — Encounter (HOSPITAL_COMMUNITY): Payer: Self-pay | Admitting: Emergency Medicine

## 2018-01-09 ENCOUNTER — Emergency Department (HOSPITAL_COMMUNITY)
Admission: EM | Admit: 2018-01-09 | Discharge: 2018-01-09 | Disposition: A | Discharge status: Home / Self Care | Payer: Medicare Other | Attending: Emergency Medicine | Admitting: Emergency Medicine

## 2018-01-09 ENCOUNTER — Other Ambulatory Visit: Payer: Self-pay

## 2018-01-09 ENCOUNTER — Emergency Department (HOSPITAL_COMMUNITY): Payer: Medicare Other

## 2018-01-09 DIAGNOSIS — I259 Chronic ischemic heart disease, unspecified: Secondary | ICD-10-CM | POA: Diagnosis not present

## 2018-01-09 DIAGNOSIS — Z87891 Personal history of nicotine dependence: Secondary | ICD-10-CM | POA: Diagnosis not present

## 2018-01-09 DIAGNOSIS — R339 Retention of urine, unspecified: Secondary | ICD-10-CM | POA: Diagnosis not present

## 2018-01-09 DIAGNOSIS — Z7902 Long term (current) use of antithrombotics/antiplatelets: Secondary | ICD-10-CM | POA: Insufficient documentation

## 2018-01-09 DIAGNOSIS — R1084 Generalized abdominal pain: Secondary | ICD-10-CM | POA: Diagnosis present

## 2018-01-09 DIAGNOSIS — Z79899 Other long term (current) drug therapy: Secondary | ICD-10-CM | POA: Diagnosis not present

## 2018-01-09 DIAGNOSIS — K5900 Constipation, unspecified: Secondary | ICD-10-CM | POA: Diagnosis not present

## 2018-01-09 LAB — URINALYSIS, ROUTINE W REFLEX MICROSCOPIC
Bilirubin Urine: NEGATIVE
Glucose, UA: NEGATIVE mg/dL
Ketones, ur: 20 mg/dL — AB
Nitrite: NEGATIVE
Protein, ur: 100 mg/dL — AB
SPECIFIC GRAVITY, URINE: 1.014 (ref 1.005–1.030)
SQUAMOUS EPITHELIAL / LPF: NONE SEEN
pH: 6 (ref 5.0–8.0)

## 2018-01-09 NOTE — ED Provider Notes (Signed)
[facility] Provider Note   CSN: [id number] Arrival date & time: 01/09/18  2703     History   Chief Complaint Chief Complaint  Patient presents with  . Abdominal Pain    HPI Caleb [name] is a 84 y.o. male.  Patient states he thinks his constipated.  But also he does self cath to empty his bladder and he cannot get the catheter in.  He has not had a catheter in to drain his bladder and 2 days   The history is provided by the patient.  Abdominal Pain   This is a new problem. The current episode started 2 days ago. The problem occurs constantly. The problem has not changed since onset.The pain is associated with an unknown factor. The pain is located in the generalized abdominal region. The quality of the pain is aching. The pain is at a severity of 4/10. Pertinent negatives include diarrhea, frequency, hematuria and headaches.    Past Medical History:  Diagnosis Date  . Coronary artery disease   . Hyperlipidemia   . Hyperlipidemia   . Obesity   . Penile cancer ([place])   . Swelling of extremity    Left Leg    Patient Active Problem List   Diagnosis Date Noted  . Scalp hematoma 01/13/2016  . Self-catheterizes urinary bladder 01/13/2016  . Pneumothorax, left 01/12/2016  . Atypical chest pain 12/02/2015  . Cellulitis 05/17/2014  . Pain in limb- Slight pain- Left Leg 05/03/2014  . Discoloration of skin-Left Leg 05/03/2014  . Stasis edema with ulcer ([place]) 08/17/2013  . Peripheral vascular disease, unspecified ([place]) 07/13/2013  . Swelling of limb 07/13/2013  . Coronary artery disease 06/07/2013  . Left upper extremity numbness 06/07/2013  . Hyperlipidemia   . Knee pain 08/11/2011    Past Surgical History:  Procedure Laterality Date  . APPENDECTOMY    . BACK SURGERY    . CHOLECYSTECTOMY    . CORONARY ANGIOPLASTY  11/2005   RCA PCI AND STENTING WITH A CYPHER DES  . CORONARY ARTERY BYPASS GRAFT    . HIATAL HERNIA REPAIR    . lymph node  removal    . NM MYOCAR MULTIPLE W/SPECT  11/26/2009   EF 62%. NORMAL MYOCARDIAL PERFUSION STUDY.  . TRANSTHORACIC ECHOCARDIOGRAM  12/22/2005   MILD TO MOD AORTIC SCLEROSIS W/O STENOSIS. MILD TO MODERATE MITRAL CALCIFICATION. LA- MILDLY DILATED.       Home Medications    Prior to Admission medications   Medication Sig Start Date End Date Taking? Authorizing Provider  acetaminophen (TYLENOL) 325 MG tablet Take 1-2 tablets (325-650 mg total) by mouth every 6 (six) hours as needed for fever, headache, mild pain or moderate pain. 01/14/16   Nat Christen, PA-C  Ascorbic Acid (VITAMIN C) 1000 MG tablet Take 1,000 mg by mouth daily.     [provider]  aspirin EC 81 MG tablet Take 81 mg by mouth daily.    [provider]  Calcium Carbonate-Vitamin D (CALCIUM-D PO) Take 1 tablet by mouth daily.    [provider]  cephALEXin (KEFLEX) 500 MG capsule Take 1 capsule (500 mg total) by mouth 4 (four) times daily. Patient not taking: Reported on 09/24/2017 01/26/17   Francine Graven, DO  clopidogrel (PLAVIX) 75 MG tablet TAKE 1 TABLET BY MOUTH  DAILY 10/25/17   Lorretta Harp, MD  docusate sodium (COLACE) 100 MG capsule Take 1 capsule (100 mg total) by mouth every 12 (twelve) hours. 01/07/18  Elnora Morrison, MD  magnesium hydroxide (MILK OF MAGNESIA) 400 MG/5ML suspension Take 30 mLs by mouth daily as needed for mild constipation.    [provider]  metoprolol tartrate (LOPRESSOR) 25 MG tablet TAKE 1 TABLET BY MOUTH  TWICE A DAY 10/25/17   Lorretta Harp, MD  Multiple Vitamin (MULTIVITAMIN WITH MINERALS) TABS tablet Take 1 tablet by mouth daily. Centrum Silver    [provider]  Multiple Vitamins-Minerals (OCUVITE PRESERVISION PO) Take 1 tablet by mouth daily as needed (For eye health).    [provider]  Nystatin [facility]) 100000 UNIT/GM POWD Apply topically to affected area(s) twice daily 02/24/16   [provider]  polyethylene  glycol powder (GLYCOLAX/MIRALAX) powder Take 17 g by mouth daily as needed for mild constipation or moderate constipation.    [provider]  simvastatin (ZOCOR) 20 MG tablet TAKE 1 TABLET BY MOUTH  EVERY NIGHT AT BEDTIME 10/25/17   Lorretta Harp, MD    Family History Family History  Problem Relation Age of Onset  . Cancer Father   . Cancer Sister   . Cancer Brother   . Cancer Sister   . Cancer Unknown     Social History Social History   Tobacco Use  . Smoking status: Former Smoker    Types: Cigarettes    Last attempt to quit: 07/14/1983    Years since quitting: 34.5  . Smokeless tobacco: Never Used  Substance Use Topics  . Alcohol use: No  . Drug use: No     Allergies   Patient has no known allergies.   Review of Systems Review of Systems  Constitutional: Negative for appetite change and fatigue.  HENT: Negative for congestion, ear discharge and sinus pressure.   Eyes: Negative for discharge.  Respiratory: Negative for cough.   Cardiovascular: Negative for chest pain.  Gastrointestinal: Positive for abdominal pain. Negative for diarrhea.  Genitourinary: Negative for frequency and hematuria.  Musculoskeletal: Negative for back pain.  Skin: Negative for rash.  Neurological: Negative for seizures and headaches.  Psychiatric/Behavioral: Negative for hallucinations.     Physical Exam Updated Vital Signs BP (!) 141/76 (BP Location: Left Arm)   Pulse 94   Temp 98.2 F (36.8 C) (Oral)   Resp 18   Ht 6' (1.829 m)   Wt 113.4 kg (250 lb)   SpO2 94%   BMI 33.91 kg/m   Physical Exam  Constitutional: He is oriented to person, place, and time. He appears well-developed.  HENT:  Head: Normocephalic.  Eyes: Conjunctivae and EOM are normal. No scleral icterus.  Neck: Neck supple. No thyromegaly present.  Cardiovascular: Normal rate and regular rhythm. Exam reveals no gallop and no friction rub.  No murmur heard. Pulmonary/Chest: No stridor. He has no  wheezes. He has no rales. He exhibits no tenderness.  Abdominal: He exhibits distension. There is no tenderness. There is no rebound.  Musculoskeletal: Normal range of motion. He exhibits no edema.  Lymphadenopathy:    He has no cervical adenopathy.  Neurological: He is oriented to person, place, and time. He exhibits normal muscle tone. Coordination normal.  Skin: No rash noted. No erythema.  Psychiatric: He has a normal mood and affect. His behavior is normal.     ED Treatments / Results  Labs (all labs ordered are listed, but only abnormal results are displayed) Labs Reviewed  URINALYSIS, ROUTINE W REFLEX MICROSCOPIC - Abnormal; Notable for the following components:      Result Value   APPearance  CLOUDY (*)    Hgb urine dipstick SMALL (*)    Ketones, ur 20 (*)    Protein, ur 100 (*)    Leukocytes, UA LARGE (*)    Bacteria, UA MANY (*)    All other components within normal limits  URINE CULTURE    EKG  EKG Interpretation None       Radiology Dg Abd Acute W/chest  Result Date: 01/09/2018 CLINICAL DATA:  Constipation EXAM: DG ABDOMEN ACUTE W/ 1V CHEST COMPARISON:  CT 02/17/2016 FINDINGS: Heart is normal size.  No confluent airspace opacities or effusions. Nonobstructive bowel gas pattern. No free air organomegaly. No suspicious calcification. No visible significant increase in stool burden. No acute bony abnormality. IMPRESSION: No evidence of bowel obstruction or free air. No acute cardiopulmonary disease. Electronically Signed   By: Rolm Baptise M.D.   On: 01/09/2018 08:12    Procedures Procedures (including critical care time)  Medications Ordered in ED Medications - No data to display   Initial Impression / Assessment and Plan / ED Course  I have reviewed the triage vital signs and the nursing notes.  Pertinent labs & imaging results that were available during my care of the patient were reviewed by me and considered in my medical decision making (see chart for  details).     X-ray does not show any constipation.  Patient had large amount of urine removed when he was catheterized.  Patient was started on antibiotics 2 days ago and he is not sure what the antibiotic is.  His urine does look infected.  He will continue that antibiotic and follow-up with either his family doctor or his urologist this week.  He will continue to keep the Foley catheter in  Final Clinical Impressions(s) / ED Diagnoses   Final diagnoses:  Urinary retention    ED Discharge Orders    None       Milton Ferguson, MD 01/09/18 1022

## 2018-01-09 NOTE — Discharge Instructions (Signed)
Keep the Foley in.  Follow-up with [facility] in [place] this week or follow-up with your family doctor.  Continue taking the antibiotic.  We are doing a urine culture and it should be ready in 2-3 days.  The doctor use he can follow-up on the urine culture to see if your antibiotic is appropriate for your infection

## 2018-01-09 NOTE — ED Triage Notes (Addendum)
Pt reports seen for constipation yesterday. Pt reports continued constipation and unable to void. Pt reports self-caths at home and reports "unable to get around prostate to advance catheter." pt denies pain.

## 2018-01-13 LAB — URINE CULTURE: Culture: >=100000 — AB

## 2018-01-14 ENCOUNTER — Telehealth: Payer: Self-pay | Admitting: *Deleted

## 2018-01-14 NOTE — Progress Notes (Signed)
ED Antimicrobial Stewardship Positive Culture Follow Up   Caleb [name] is an 84 y.o. male who presented to [facility] on 01/09/2018 with a chief complaint of  Chief Complaint  Patient presents with  . Abdominal Pain    Recent Results (from the past 720 hour(s))  Urine Culture     Status: Abnormal   Collection Time: 01/09/18 10:17 AM  Result Value Ref Range Status   Specimen Description URINE, CLEAN CATCH  Final   Special Requests NONE  Final   Culture (A)  Final    >=100,000 COLONIES/mL KLEBSIELLA PNEUMONIAE >=100,000 COLONIES/mL ENTEROCOCCUS FAECALIS ORGANISM 1 Confirmed Extended Spectrum Beta-Lactamase Producer (ESBL).  In bloodstream infections from ESBL organisms, carbapenems are preferred over piperacillin/tazobactam. They are shown to have a lower risk of mortality. Performed at [facility], [place] [address] [place], [place] [address]    Report Status 01/13/2018 FINAL  Final   Organism ID, Bacteria ENTEROCOCCUS FAECALIS (A)  Final   Organism ID, Bacteria KLEBSIELLA PNEUMONIAE (A)  Final      Susceptibility   Enterococcus faecalis - MIC*    AMPICILLIN <=2 SENSITIVE Sensitive     LEVOFLOXACIN >=8 RESISTANT Resistant     NITROFURANTOIN 32 SENSITIVE Sensitive     VANCOMYCIN 1 SENSITIVE Sensitive     * >=100,000 COLONIES/mL ENTEROCOCCUS FAECALIS   Klebsiella pneumoniae - MIC*    AMPICILLIN >=32 RESISTANT Resistant     CEFAZOLIN >=64 RESISTANT Resistant     CEFTRIAXONE >=64 RESISTANT Resistant     CIPROFLOXACIN 2 INTERMEDIATE Intermediate     GENTAMICIN <=1 SENSITIVE Sensitive     IMIPENEM <=0.25 SENSITIVE Sensitive     NITROFURANTOIN 64 INTERMEDIATE Intermediate     TRIMETH/SULFA >=320 RESISTANT Resistant     AMPICILLIN/SULBACTAM >=32 RESISTANT Resistant     PIP/TAZO 32 INTERMEDIATE Intermediate     Extended ESBL POSITIVE Resistant     * >=100,000 COLONIES/mL KLEBSIELLA PNEUMONIAE      New antibiotic prescription: no further treatment indicated at this time.    ED Provider: Alyse Low, PA-C    Jalene Mullet, Pharm.D. PGY1 Pharmacy Resident 01/14/2018 9:46 AM Main Pharmacy: [id number]  Phone# [phone]

## 2018-01-14 NOTE — Telephone Encounter (Signed)
Post ED Visit - Positive Culture Follow-up  Culture report reviewed by antimicrobial stewardship pharmacist:  []  Elenor Quinones, Pharm.D. []  Heide Guile, Pharm.D., BCPS AQ-ID []  Parks Neptune, Pharm.D., BCPS []  Alycia Rossetti, Pharm.D., BCPS []  Yonah, Pharm.D., BCPS, AAHIVP []  Legrand Como, Pharm.D., BCPS, AAHIVP []  Salome Arnt, PharmD, BCPS [x]  Jalene Mullet, PharmD []  Vincenza Hews, PharmD, BCPS  Positive urine culture Treated with Keflex 01/11, organism sensitive to the same and no further patient follow-up is required at this time.  Harlon Flor [facility] 01/14/2018, 10:09 AM

## 2018-01-28 ENCOUNTER — Ambulatory Visit (INDEPENDENT_AMBULATORY_CARE_PROVIDER_SITE_OTHER): Payer: Medicare Other | Admitting: Urology

## 2018-01-28 ENCOUNTER — Ambulatory Visit: Payer: Medicare Other | Admitting: Urology

## 2018-01-28 DIAGNOSIS — R338 Other retention of urine: Secondary | ICD-10-CM

## 2018-02-17 DIAGNOSIS — H353232 Exudative age-related macular degeneration, bilateral, with inactive choroidal neovascularization: Secondary | ICD-10-CM | POA: Diagnosis not present

## 2018-03-02 DIAGNOSIS — M7989 Other specified soft tissue disorders: Secondary | ICD-10-CM | POA: Diagnosis not present

## 2018-03-02 DIAGNOSIS — Z6834 Body mass index (BMI) 34.0-34.9, adult: Secondary | ICD-10-CM | POA: Diagnosis not present

## 2018-03-02 DIAGNOSIS — I878 Other specified disorders of veins: Secondary | ICD-10-CM | POA: Diagnosis not present

## 2018-03-02 DIAGNOSIS — E6609 Other obesity due to excess calories: Secondary | ICD-10-CM | POA: Diagnosis not present

## 2018-03-02 DIAGNOSIS — I872 Venous insufficiency (chronic) (peripheral): Secondary | ICD-10-CM | POA: Diagnosis not present

## 2018-03-24 ENCOUNTER — Other Ambulatory Visit: Payer: Self-pay | Admitting: Cardiovascular Disease

## 2018-04-12 DIAGNOSIS — E6609 Other obesity due to excess calories: Secondary | ICD-10-CM | POA: Diagnosis not present

## 2018-04-12 DIAGNOSIS — J329 Chronic sinusitis, unspecified: Secondary | ICD-10-CM | POA: Diagnosis not present

## 2018-04-12 DIAGNOSIS — Z6834 Body mass index (BMI) 34.0-34.9, adult: Secondary | ICD-10-CM | POA: Diagnosis not present

## 2018-04-12 DIAGNOSIS — M1991 Primary osteoarthritis, unspecified site: Secondary | ICD-10-CM | POA: Diagnosis not present

## 2018-04-12 DIAGNOSIS — C609 Malignant neoplasm of penis, unspecified: Secondary | ICD-10-CM | POA: Diagnosis not present

## 2018-04-12 DIAGNOSIS — J9801 Acute bronchospasm: Secondary | ICD-10-CM | POA: Diagnosis not present

## 2018-05-13 ENCOUNTER — Other Ambulatory Visit: Payer: Self-pay

## 2018-05-13 MED ORDER — CLOPIDOGREL BISULFATE 75 MG PO TABS: 75.0000 mg | ORAL_TABLET | Freq: Every day | ORAL | 0 refills | Status: DC

## 2018-05-13 MED ORDER — SIMVASTATIN 20 MG PO TABS: 20.0000 mg | ORAL_TABLET | Freq: Every day | ORAL | 0 refills | Status: DC

## 2018-05-18 DIAGNOSIS — R590 Localized enlarged lymph nodes: Secondary | ICD-10-CM | POA: Diagnosis not present

## 2018-05-18 DIAGNOSIS — Z8549 Personal history of malignant neoplasm of other male genital organs: Secondary | ICD-10-CM | POA: Diagnosis not present

## 2018-05-18 DIAGNOSIS — Z9079 Acquired absence of other genital organ(s): Secondary | ICD-10-CM | POA: Diagnosis not present

## 2018-05-18 DIAGNOSIS — Z08 Encounter for follow-up examination after completed treatment for malignant neoplasm: Secondary | ICD-10-CM | POA: Diagnosis not present

## 2018-05-18 DIAGNOSIS — Z87891 Personal history of nicotine dependence: Secondary | ICD-10-CM | POA: Diagnosis not present

## 2018-05-18 DIAGNOSIS — C609 Malignant neoplasm of penis, unspecified: Secondary | ICD-10-CM | POA: Diagnosis not present

## 2018-05-19 ENCOUNTER — Other Ambulatory Visit: Payer: Self-pay | Admitting: Cardiovascular Disease

## 2018-05-19 DIAGNOSIS — H353122 Nonexudative age-related macular degeneration, left eye, intermediate dry stage: Secondary | ICD-10-CM | POA: Diagnosis not present

## 2018-05-19 DIAGNOSIS — H353211 Exudative age-related macular degeneration, right eye, with active choroidal neovascularization: Secondary | ICD-10-CM | POA: Diagnosis not present

## 2018-05-20 NOTE — Telephone Encounter (Signed)
Rx request sent to pharmacy.  

## 2018-06-01 DIAGNOSIS — L821 Other seborrheic keratosis: Secondary | ICD-10-CM | POA: Diagnosis not present

## 2018-06-01 DIAGNOSIS — L708 Other acne: Secondary | ICD-10-CM | POA: Diagnosis not present

## 2018-06-01 DIAGNOSIS — L820 Inflamed seborrheic keratosis: Secondary | ICD-10-CM | POA: Diagnosis not present

## 2018-07-07 DIAGNOSIS — H353211 Exudative age-related macular degeneration, right eye, with active choroidal neovascularization: Secondary | ICD-10-CM | POA: Diagnosis not present

## 2018-07-07 DIAGNOSIS — H353122 Nonexudative age-related macular degeneration, left eye, intermediate dry stage: Secondary | ICD-10-CM | POA: Diagnosis not present

## 2018-07-21 ENCOUNTER — Other Ambulatory Visit (HOSPITAL_COMMUNITY): Payer: Self-pay | Admitting: Family Medicine

## 2018-07-21 DIAGNOSIS — C609 Malignant neoplasm of penis, unspecified: Secondary | ICD-10-CM | POA: Diagnosis not present

## 2018-07-21 DIAGNOSIS — Z6834 Body mass index (BMI) 34.0-34.9, adult: Secondary | ICD-10-CM | POA: Diagnosis not present

## 2018-07-21 DIAGNOSIS — D519 Vitamin B12 deficiency anemia, unspecified: Secondary | ICD-10-CM | POA: Diagnosis not present

## 2018-07-21 DIAGNOSIS — I2510 Atherosclerotic heart disease of native coronary artery without angina pectoris: Secondary | ICD-10-CM | POA: Diagnosis not present

## 2018-07-21 DIAGNOSIS — E785 Hyperlipidemia, unspecified: Secondary | ICD-10-CM | POA: Diagnosis not present

## 2018-07-21 DIAGNOSIS — E782 Mixed hyperlipidemia: Secondary | ICD-10-CM | POA: Diagnosis not present

## 2018-07-21 DIAGNOSIS — D509 Iron deficiency anemia, unspecified: Secondary | ICD-10-CM | POA: Diagnosis not present

## 2018-07-21 DIAGNOSIS — I10 Essential (primary) hypertension: Secondary | ICD-10-CM | POA: Diagnosis not present

## 2018-07-21 DIAGNOSIS — Z0001 Encounter for general adult medical examination with abnormal findings: Secondary | ICD-10-CM | POA: Diagnosis not present

## 2018-07-21 DIAGNOSIS — Z1389 Encounter for screening for other disorder: Secondary | ICD-10-CM | POA: Diagnosis not present

## 2018-07-21 DIAGNOSIS — R7309 Other abnormal glucose: Secondary | ICD-10-CM | POA: Diagnosis not present

## 2018-07-21 DIAGNOSIS — E6609 Other obesity due to excess calories: Secondary | ICD-10-CM | POA: Diagnosis not present

## 2018-07-21 DIAGNOSIS — M7989 Other specified soft tissue disorders: Secondary | ICD-10-CM

## 2018-07-22 ENCOUNTER — Other Ambulatory Visit: Payer: Self-pay | Admitting: Cardiovascular Disease

## 2018-07-22 ENCOUNTER — Ambulatory Visit (HOSPITAL_COMMUNITY)
Admission: RE | Admit: 2018-07-22 | Discharge: 2018-07-22 | Disposition: A | Discharge status: Home / Self Care | Payer: Medicare Other | Source: Ambulatory Visit | Attending: Family Medicine | Admitting: Family Medicine

## 2018-07-22 DIAGNOSIS — M7989 Other specified soft tissue disorders: Secondary | ICD-10-CM | POA: Diagnosis not present

## 2018-07-22 DIAGNOSIS — R600 Localized edema: Secondary | ICD-10-CM | POA: Diagnosis not present

## 2018-07-22 NOTE — Telephone Encounter (Signed)
Rx request sent to pharmacy.  

## 2018-08-18 DIAGNOSIS — H353221 Exudative age-related macular degeneration, left eye, with active choroidal neovascularization: Secondary | ICD-10-CM | POA: Diagnosis not present

## 2018-09-01 DIAGNOSIS — Z1389 Encounter for screening for other disorder: Secondary | ICD-10-CM | POA: Diagnosis not present

## 2018-09-01 DIAGNOSIS — S8010XA Contusion of unspecified lower leg, initial encounter: Secondary | ICD-10-CM | POA: Diagnosis not present

## 2018-09-01 DIAGNOSIS — Z6833 Body mass index (BMI) 33.0-33.9, adult: Secondary | ICD-10-CM | POA: Diagnosis not present

## 2018-09-01 DIAGNOSIS — E6609 Other obesity due to excess calories: Secondary | ICD-10-CM | POA: Diagnosis not present

## 2018-09-14 ENCOUNTER — Other Ambulatory Visit: Payer: Self-pay | Admitting: Cardiovascular Disease

## 2018-09-16 ENCOUNTER — Other Ambulatory Visit: Payer: Self-pay | Admitting: Cardiovascular Disease

## 2018-09-16 ENCOUNTER — Other Ambulatory Visit: Payer: Self-pay

## 2018-09-16 MED ORDER — METOPROLOL TARTRATE 25 MG PO TABS: 25.0000 mg | ORAL_TABLET | Freq: Two times a day (BID) | ORAL | 1 refills | Status: DC

## 2018-09-21 ENCOUNTER — Other Ambulatory Visit: Payer: Self-pay | Admitting: *Deleted

## 2018-09-21 MED ORDER — CLOPIDOGREL BISULFATE 75 MG PO TABS: 75.0000 mg | ORAL_TABLET | Freq: Every day | ORAL | 0 refills | Status: DC

## 2018-09-21 MED ORDER — SIMVASTATIN 20 MG PO TABS: 20.0000 mg | ORAL_TABLET | Freq: Every day | ORAL | 0 refills | Status: DC

## 2018-09-21 MED ORDER — METOPROLOL TARTRATE 25 MG PO TABS: 25.0000 mg | ORAL_TABLET | Freq: Two times a day (BID) | ORAL | 0 refills | Status: DC

## 2018-10-05 ENCOUNTER — Ambulatory Visit (INDEPENDENT_AMBULATORY_CARE_PROVIDER_SITE_OTHER): Payer: Medicare Other | Admitting: Cardiovascular Disease

## 2018-10-05 ENCOUNTER — Encounter: Payer: Self-pay | Admitting: Cardiovascular Disease

## 2018-10-05 DIAGNOSIS — I2510 Atherosclerotic heart disease of native coronary artery without angina pectoris: Secondary | ICD-10-CM | POA: Diagnosis not present

## 2018-10-05 DIAGNOSIS — E7800 Pure hypercholesterolemia, unspecified: Secondary | ICD-10-CM | POA: Diagnosis not present

## 2018-10-05 MED ORDER — SIMVASTATIN 20 MG PO TABS: 20.0000 mg | ORAL_TABLET | Freq: Every day | ORAL | 3 refills | Status: DC

## 2018-10-05 MED ORDER — METOPROLOL TARTRATE 25 MG PO TABS: 25.0000 mg | ORAL_TABLET | Freq: Two times a day (BID) | ORAL | 3 refills | Status: DC

## 2018-10-05 MED ORDER — CLOPIDOGREL BISULFATE 75 MG PO TABS: 75.0000 mg | ORAL_TABLET | Freq: Every day | ORAL | 3 refills | Status: DC

## 2018-10-05 NOTE — Assessment & Plan Note (Signed)
History of CAD status post RCA stenting 12/19/2005 with a 3 mm x 13 mm long Cypher drug-eluting stent.  There is no additional CAD.  His last Myoview performed 2010 was nonischemic.  He denies chest pain or shortness of breath.

## 2018-10-05 NOTE — Patient Instructions (Signed)
Medication Instructions:  Your physician recommends that you continue on your current medications as directed. Please refer to the Current Medication list given to you today.  If you need a refill on your cardiac medications before your next appointment, please call your pharmacy.   Lab work: none If you have labs (blood work) drawn today and your tests are completely normal, you will receive your results only by: . MyChart Message (if you have MyChart) OR . A paper copy in the mail If you have any lab test that is abnormal or we need to change your treatment, we will call you to review the results.  Testing/Procedures: none  Follow-Up: At [facility], you and your health needs are our priority.  As part of our continuing mission to provide you with exceptional heart care, we have created designated Provider Care Teams.  These Care Teams include your primary Cardiologist (physician) and Advanced Practice Providers (APPs -  Physician Assistants and Nurse Practitioners) who all work together to provide you with the care you need, when you need it. You will need a follow up appointment in 12 months.  Please call our office 2 months in advance to schedule this appointment.  You may see Dr. Berry or one of the following Advanced Practice Providers on your designated Care Team:   Luke Kilroy, PA-C Krista Kroeger, PA-C . Callie Goodrich, PA-C  Any Other Special Instructions Will Be Listed Below (If Applicable).    

## 2018-10-05 NOTE — Progress Notes (Signed)
10/05/2018 Caleb [name]   [birth year]  [id number]  Primary Physician Sharilyn Sites, MD Primary Cardiologist: Lorretta Harp MD FACP, [place], [place], Caleb  HPI:  Caleb [name] is a 84 y.o.  overweight Caucasian male with a history of coronary artery disease status post RCA PCI and stenting with a Cypher drug-eluting stent December 2006. He also had a 50% mid LAD lesion with normal LV function at that time. His last 2D echocardiogram was in 2006 as well which showed normal LV function possibly at the lower limits of normal. He has mild to moderate aortic sclerosis without stenosis. Mild to moderate mitral annular calcification. Left atrium was mildly dilated. He also has a history of hyperlipidemia.    Since I saw him n the office 06/17/2016. Since that time he denies chest pain or shortness of breath.   Current Meds  Medication Sig  . acetaminophen (TYLENOL) 325 MG tablet Take 1-2 tablets (325-650 mg total) by mouth every 6 (six) hours as needed for fever, headache, mild pain or moderate pain.  . Ascorbic Acid (VITAMIN C) 1000 MG tablet Take 1,000 mg by mouth daily.   Mar[place] Kitchen aspirin EC 81 MG tablet Take 81 mg by mouth daily.  . Calcium Carbonate-Vitamin D (CALCIUM-D PO) Take 1 tablet by mouth daily.  . cephALEXin (KEFLEX) 500 MG capsule Take 1 capsule (500 mg total) by mouth 4 (four) times daily.  . clopidogrel (PLAVIX) 75 MG tablet Take 1 tablet (75 mg total) by mouth daily. Please schedule appointment for refills.  . clopidogrel (PLAVIX) 75 MG tablet Take 1 tablet (75 mg total) by mouth daily. NEED OV.  Marland Kitchen docusate sodium (COLACE) 100 MG capsule Take 1 capsule (100 mg total) by mouth every 12 (twelve) hours.  . magnesium hydroxide (MILK OF MAGNESIA) 400 MG/5ML suspension Take 30 mLs by mouth daily as needed for mild constipation.  . metoprolol tartrate (LOPRESSOR) 25 MG tablet Take 1 tablet (25 mg total) by mouth 2 (two) times daily. NEED OV.  . Multiple Vitamin (MULTIVITAMIN WITH  MINERALS) TABS tablet Take 1 tablet by mouth daily. Centrum Silver  . Multiple Vitamins-Minerals (OCUVITE PRESERVISION PO) Take 1 tablet by mouth daily as needed (For eye health).  . Nystatin ([place]) 100000 UNIT/GM POWD Apply topically to affected area(s) twice daily  . polyethylene glycol powder (GLYCOLAX/MIRALAX) powder Take 17 g by mouth daily as needed for mild constipation or moderate constipation.  . simvastatin (ZOCOR) 20 MG tablet Take 1 tablet (20 mg total) by mouth at bedtime. Please schedule appointment for refills.  . simvastatin (ZOCOR) 20 MG tablet Take 1 tablet (20 mg total) by mouth at bedtime. NEED OV.     No Known Allergies  Social History   Socioeconomic History  . Marital status: Divorced    Spouse name: Not on file  . Number of children: Not on file  . Years of education: Not on file  . Highest education level: Not on file  Occupational History  . Occupation: retired    Fish farm manager: RETIRED  Social Needs  . Financial resource strain: Not on file  . Food insecurity:    Worry: Not on file    Inability: Not on file  . Transportation needs:    Medical: Not on file    Non-medical: Not on file  Tobacco Use  . Smoking status: Former Smoker    Types: Cigarettes    Last attempt to quit: 07/14/1983    Years since quitting: 35.2  . Smokeless  tobacco: Never Used  Substance and Sexual Activity  . Alcohol use: No  . Drug use: No  . Sexual activity: Not on file  Lifestyle  . Physical activity:    Days per week: Not on file    Minutes per session: Not on file  . Stress: Not on file  Relationships  . Social connections:    Talks on phone: Not on file    Gets together: Not on file    Attends religious service: Not on file    Active member of club or organization: Not on file    Attends meetings of clubs or organizations: Not on file    Relationship status: Not on file  . Intimate partner violence:    Fear of current or ex partner: Not on file    Emotionally  abused: Not on file    Physically abused: Not on file    Forced sexual activity: Not on file  Other Topics Concern  . Not on file  Social History Narrative  . Not on file     Review of Systems: General: negative for chills, fever, night sweats or weight changes.  Cardiovascular: negative for chest pain, dyspnea on exertion, edema, orthopnea, palpitations, paroxysmal nocturnal dyspnea or shortness of breath Dermatological: negative for rash Respiratory: negative for cough or wheezing Urologic: negative for hematuria Abdominal: negative for nausea, vomiting, diarrhea, bright red blood per rectum, melena, or hematemesis Neurologic: negative for visual changes, syncope, or dizziness All other systems reviewed and are otherwise negative except as noted above.    Blood pressure 112/70, pulse 74, height 6' (1.829 m), weight 253 lb (114.8 kg).  General appearance: alert and no distress Neck: no adenopathy, no carotid bruit, no JVD, supple, symmetrical, trachea midline and thyroid not enlarged, symmetric, no tenderness/mass/nodules Lungs: clear to auscultation bilaterally Heart: regular rate and rhythm, S1, S2 normal, no murmur, click, rub or gallop Extremities: extremities normal, atraumatic, no cyanosis or edema Pulses: 2+ and symmetric Skin: Skin color, texture, turgor normal. No rashes or lesions Neurologic: Alert and oriented X 3, normal strength and tone. Normal symmetric reflexes. Normal coordination and gait  EKG sinus rhythm at 74 with inferior Q waves and early R wave transition.  Personally reviewed this EKG.  ASSESSMENT AND PLAN:   Hyperlipidemia History of hyperlipidemia on statin therapy with recent lipid profile performed 07/21/2018 revealing total cholesterol 104, LDL 37 HDL 43.  Coronary artery disease History of CAD status post RCA stenting 12/19/2005 with a 3 mm x 13 mm long Cypher drug-eluting stent.  There is no additional CAD.  His last Myoview performed 2010 was  nonischemic.  He denies chest pain or shortness of breath.      Lorretta Harp MD FACP,FACC,FAHA, [facility] 10/05/2018 11:11 AM

## 2018-10-05 NOTE — Assessment & Plan Note (Signed)
History of hyperlipidemia on statin therapy with recent lipid profile performed 07/21/2018 revealing total cholesterol 104, LDL 37 HDL 43.

## 2018-10-06 DIAGNOSIS — Z23 Encounter for immunization: Secondary | ICD-10-CM | POA: Diagnosis not present

## 2018-10-13 DIAGNOSIS — H353122 Nonexudative age-related macular degeneration, left eye, intermediate dry stage: Secondary | ICD-10-CM | POA: Diagnosis not present

## 2018-10-13 DIAGNOSIS — H353211 Exudative age-related macular degeneration, right eye, with active choroidal neovascularization: Secondary | ICD-10-CM | POA: Diagnosis not present

## 2018-12-15 DIAGNOSIS — H353122 Nonexudative age-related macular degeneration, left eye, intermediate dry stage: Secondary | ICD-10-CM | POA: Diagnosis not present

## 2018-12-15 DIAGNOSIS — H353211 Exudative age-related macular degeneration, right eye, with active choroidal neovascularization: Secondary | ICD-10-CM | POA: Diagnosis not present

## 2019-01-11 DIAGNOSIS — N390 Urinary tract infection, site not specified: Secondary | ICD-10-CM | POA: Diagnosis not present

## 2019-01-11 DIAGNOSIS — N312 Flaccid neuropathic bladder, not elsewhere classified: Secondary | ICD-10-CM | POA: Diagnosis not present

## 2019-02-22 ENCOUNTER — Ambulatory Visit (INDEPENDENT_AMBULATORY_CARE_PROVIDER_SITE_OTHER): Payer: Medicare Other | Admitting: Urology

## 2019-02-22 DIAGNOSIS — R338 Other retention of urine: Secondary | ICD-10-CM

## 2019-02-23 ENCOUNTER — Other Ambulatory Visit: Payer: Self-pay

## 2019-02-23 MED ORDER — METOPROLOL TARTRATE 25 MG PO TABS: 25.0000 mg | ORAL_TABLET | Freq: Two times a day (BID) | ORAL | 2 refills | Status: DC

## 2019-02-23 MED ORDER — SIMVASTATIN 20 MG PO TABS: 20.0000 mg | ORAL_TABLET | Freq: Every day | ORAL | 2 refills | Status: DC

## 2019-02-23 MED ORDER — CLOPIDOGREL BISULFATE 75 MG PO TABS: 75.0000 mg | ORAL_TABLET | Freq: Every day | ORAL | 2 refills | Status: DC

## 2019-02-23 NOTE — Telephone Encounter (Signed)
Rx(s) sent to pharmacy electronically.  

## 2019-02-24 DIAGNOSIS — H353211 Exudative age-related macular degeneration, right eye, with active choroidal neovascularization: Secondary | ICD-10-CM | POA: Diagnosis not present

## 2019-02-24 DIAGNOSIS — H2513 Age-related nuclear cataract, bilateral: Secondary | ICD-10-CM | POA: Diagnosis not present

## 2019-02-24 DIAGNOSIS — H353112 Nonexudative age-related macular degeneration, right eye, intermediate dry stage: Secondary | ICD-10-CM | POA: Diagnosis not present

## 2019-03-06 DIAGNOSIS — H353211 Exudative age-related macular degeneration, right eye, with active choroidal neovascularization: Secondary | ICD-10-CM | POA: Diagnosis not present

## 2019-04-11 DIAGNOSIS — R338 Other retention of urine: Secondary | ICD-10-CM | POA: Diagnosis not present

## 2019-04-18 DIAGNOSIS — R338 Other retention of urine: Secondary | ICD-10-CM | POA: Diagnosis not present

## 2019-05-17 DIAGNOSIS — Z800 Family history of malignant neoplasm of digestive organs: Secondary | ICD-10-CM | POA: Diagnosis not present

## 2019-05-17 DIAGNOSIS — Z9079 Acquired absence of other genital organ(s): Secondary | ICD-10-CM | POA: Diagnosis not present

## 2019-05-17 DIAGNOSIS — K5730 Diverticulosis of large intestine without perforation or abscess without bleeding: Secondary | ICD-10-CM | POA: Diagnosis not present

## 2019-05-17 DIAGNOSIS — N401 Enlarged prostate with lower urinary tract symptoms: Secondary | ICD-10-CM | POA: Diagnosis not present

## 2019-05-17 DIAGNOSIS — Z803 Family history of malignant neoplasm of breast: Secondary | ICD-10-CM | POA: Diagnosis not present

## 2019-05-17 DIAGNOSIS — Z801 Family history of malignant neoplasm of trachea, bronchus and lung: Secondary | ICD-10-CM | POA: Diagnosis not present

## 2019-05-17 DIAGNOSIS — N3289 Other specified disorders of bladder: Secondary | ICD-10-CM | POA: Diagnosis not present

## 2019-05-17 DIAGNOSIS — Z08 Encounter for follow-up examination after completed treatment for malignant neoplasm: Secondary | ICD-10-CM | POA: Diagnosis not present

## 2019-05-17 DIAGNOSIS — K838 Other specified diseases of biliary tract: Secondary | ICD-10-CM | POA: Diagnosis not present

## 2019-05-17 DIAGNOSIS — Z79899 Other long term (current) drug therapy: Secondary | ICD-10-CM | POA: Diagnosis not present

## 2019-05-17 DIAGNOSIS — C609 Malignant neoplasm of penis, unspecified: Secondary | ICD-10-CM | POA: Diagnosis not present

## 2019-05-17 DIAGNOSIS — R339 Retention of urine, unspecified: Secondary | ICD-10-CM | POA: Diagnosis not present

## 2019-05-17 DIAGNOSIS — Z87891 Personal history of nicotine dependence: Secondary | ICD-10-CM | POA: Diagnosis not present

## 2019-05-17 DIAGNOSIS — Z8549 Personal history of malignant neoplasm of other male genital organs: Secondary | ICD-10-CM | POA: Diagnosis not present

## 2019-05-17 DIAGNOSIS — R338 Other retention of urine: Secondary | ICD-10-CM | POA: Diagnosis not present

## 2019-05-18 DIAGNOSIS — R338 Other retention of urine: Secondary | ICD-10-CM | POA: Diagnosis not present

## 2019-05-29 DIAGNOSIS — H353211 Exudative age-related macular degeneration, right eye, with active choroidal neovascularization: Secondary | ICD-10-CM | POA: Diagnosis not present

## 2019-06-07 ENCOUNTER — Other Ambulatory Visit: Payer: Self-pay

## 2019-06-07 ENCOUNTER — Other Ambulatory Visit: Payer: Medicare Other

## 2019-06-07 DIAGNOSIS — Z20822 Contact with and (suspected) exposure to covid-19: Secondary | ICD-10-CM

## 2019-06-07 DIAGNOSIS — R6889 Other general symptoms and signs: Secondary | ICD-10-CM | POA: Diagnosis not present

## 2019-06-13 LAB — NOVEL CORONAVIRUS, NAA: SARS-CoV-2, NAA: NOT DETECTED

## 2019-06-20 ENCOUNTER — Ambulatory Visit (INDEPENDENT_AMBULATORY_CARE_PROVIDER_SITE_OTHER): Payer: Medicare Other | Admitting: Urology

## 2019-06-20 DIAGNOSIS — R338 Other retention of urine: Secondary | ICD-10-CM

## 2019-08-01 DIAGNOSIS — E6609 Other obesity due to excess calories: Secondary | ICD-10-CM | POA: Diagnosis not present

## 2019-08-01 DIAGNOSIS — I10 Essential (primary) hypertension: Secondary | ICD-10-CM | POA: Diagnosis not present

## 2019-08-01 DIAGNOSIS — Z0000 Encounter for general adult medical examination without abnormal findings: Secondary | ICD-10-CM | POA: Diagnosis not present

## 2019-08-01 DIAGNOSIS — R7309 Other abnormal glucose: Secondary | ICD-10-CM | POA: Diagnosis not present

## 2019-08-01 DIAGNOSIS — Z1389 Encounter for screening for other disorder: Secondary | ICD-10-CM | POA: Diagnosis not present

## 2019-08-01 DIAGNOSIS — C609 Malignant neoplasm of penis, unspecified: Secondary | ICD-10-CM | POA: Diagnosis not present

## 2019-08-01 DIAGNOSIS — Z6833 Body mass index (BMI) 33.0-33.9, adult: Secondary | ICD-10-CM | POA: Diagnosis not present

## 2019-08-02 ENCOUNTER — Ambulatory Visit (INDEPENDENT_AMBULATORY_CARE_PROVIDER_SITE_OTHER): Payer: Medicare Other | Admitting: Urology

## 2019-08-02 DIAGNOSIS — R338 Other retention of urine: Secondary | ICD-10-CM | POA: Diagnosis not present

## 2019-08-02 DIAGNOSIS — R7309 Other abnormal glucose: Secondary | ICD-10-CM | POA: Diagnosis not present

## 2019-08-07 ENCOUNTER — Other Ambulatory Visit: Payer: Self-pay | Admitting: Internal Medicine

## 2019-08-07 ENCOUNTER — Other Ambulatory Visit (HOSPITAL_COMMUNITY): Payer: Self-pay | Admitting: Internal Medicine

## 2019-08-07 ENCOUNTER — Other Ambulatory Visit: Payer: Self-pay

## 2019-08-07 ENCOUNTER — Ambulatory Visit (HOSPITAL_COMMUNITY)
Admission: RE | Admit: 2019-08-07 | Discharge: 2019-08-07 | Disposition: A | Discharge status: Home / Self Care | Payer: Medicare Other | Source: Ambulatory Visit | Attending: Internal Medicine | Admitting: Internal Medicine

## 2019-08-07 DIAGNOSIS — M79605 Pain in left leg: Secondary | ICD-10-CM

## 2019-08-07 DIAGNOSIS — R600 Localized edema: Secondary | ICD-10-CM | POA: Diagnosis not present

## 2019-08-14 DIAGNOSIS — H353221 Exudative age-related macular degeneration, left eye, with active choroidal neovascularization: Secondary | ICD-10-CM | POA: Diagnosis not present

## 2019-08-14 DIAGNOSIS — H2513 Age-related nuclear cataract, bilateral: Secondary | ICD-10-CM | POA: Diagnosis not present

## 2019-08-14 DIAGNOSIS — H35363 Drusen (degenerative) of macula, bilateral: Secondary | ICD-10-CM | POA: Diagnosis not present

## 2019-08-14 DIAGNOSIS — H353211 Exudative age-related macular degeneration, right eye, with active choroidal neovascularization: Secondary | ICD-10-CM | POA: Diagnosis not present

## 2019-09-29 DIAGNOSIS — H353231 Exudative age-related macular degeneration, bilateral, with active choroidal neovascularization: Secondary | ICD-10-CM | POA: Diagnosis not present

## 2019-10-04 DIAGNOSIS — Z23 Encounter for immunization: Secondary | ICD-10-CM | POA: Diagnosis not present

## 2019-10-17 ENCOUNTER — Encounter: Payer: Self-pay | Admitting: Cardiovascular Disease

## 2019-10-17 ENCOUNTER — Ambulatory Visit (INDEPENDENT_AMBULATORY_CARE_PROVIDER_SITE_OTHER): Payer: Medicare Other | Admitting: Cardiovascular Disease

## 2019-10-17 ENCOUNTER — Other Ambulatory Visit: Payer: Self-pay

## 2019-10-17 VITALS — BP 114/62 | HR 66 | Temp 97.5°F | Ht 72.0 in | Wt 242.0 lb

## 2019-10-17 DIAGNOSIS — I2510 Atherosclerotic heart disease of native coronary artery without angina pectoris: Secondary | ICD-10-CM

## 2019-10-17 DIAGNOSIS — Z008 Encounter for other general examination: Secondary | ICD-10-CM

## 2019-10-17 DIAGNOSIS — E785 Hyperlipidemia, unspecified: Secondary | ICD-10-CM | POA: Diagnosis not present

## 2019-10-17 NOTE — Patient Instructions (Signed)

## 2019-10-17 NOTE — Progress Notes (Signed)
10/17/2019 [place] Lopes   04-Jul-1925  [id number]  Primary Physician Lorretta Harp, MD Primary Cardiologist: Lorretta Harp MD FACP, [employer], [employer], [place]  HPI:  Caleb [name] is a 85 y.o.  overweight Caucasian male with a history of coronary artery disease status post RCA PCI and stenting with a Cypher drug-eluting stent December 2006.  I last saw him in the office 10/05/2018.  He also had a 50% mid LAD lesion with normal LV function at that time. His last 2D echocardiogram was in 2006 as well which showed normal LV function possibly at the lower limits of normal. He has mild to moderate aortic sclerosis without stenosis. Mild to moderate mitral annular calcification. Left atrium was mildly dilated. He also has a history of hyperlipidemia.  Since I saw him in the office a year ago he continues to do well.  He walks with a Glymph.  He denies chest pain or shortness of breath.   Since I saw him n the office 06/17/2016. Since that time he denies chest pain or shortness of breath.   Current Meds  Medication Sig  . acetaminophen (TYLENOL) 325 MG tablet Take 1-2 tablets (325-650 mg total) by mouth every 6 (six) hours as needed for fever, headache, mild pain or moderate pain.  . Ascorbic Acid (VITAMIN C) 1000 MG tablet Take 1,000 mg by mouth daily.   Marland Kitchen aspirin EC 81 MG tablet Take 81 mg by mouth daily.  . Calcium Carbonate-Vitamin D (CALCIUM-D PO) Take 1 tablet by mouth daily.  . cephALEXin (KEFLEX) 500 MG capsule Take 1 capsule (500 mg total) by mouth 4 (four) times daily.  . clopidogrel (PLAVIX) 75 MG tablet Take 1 tablet (75 mg total) by mouth daily.  Marland Kitchen docusate sodium (COLACE) 100 MG capsule Take 1 capsule (100 mg total) by mouth every 12 (twelve) hours.  . magnesium hydroxide (MILK OF MAGNESIA) 400 MG/5ML suspension Take 30 mLs by mouth daily as needed for mild constipation.  . metoprolol tartrate (LOPRESSOR) 25 MG tablet Take 1 tablet (25 mg total) by mouth 2 (two) times daily.   . Multiple Vitamin (MULTIVITAMIN WITH MINERALS) TABS tablet Take 1 tablet by mouth daily. Centrum Silver  . Multiple Vitamins-Minerals (OCUVITE PRESERVISION PO) Take 1 tablet by mouth daily as needed (For eye health).  . Nystatin ([place]) 100000 UNIT/GM POWD Apply topically to affected area(s) twice daily  . polyethylene glycol powder (GLYCOLAX/MIRALAX) powder Take 17 g by mouth daily as needed for mild constipation or moderate constipation.  . simvastatin (ZOCOR) 20 MG tablet Take 1 tablet (20 mg total) by mouth at bedtime.     No Known Allergies  Social History   Socioeconomic History  . Marital status: Divorced    Spouse name: Not on file  . Number of children: Not on file  . Years of education: Not on file  . Highest education level: Not on file  Occupational History  . Occupation: retired    Fish farm manager: RETIRED  Social Needs  . Financial resource strain: Not on file  . Food insecurity    Worry: Not on file    Inability: Not on file  . Transportation needs    Medical: Not on file    Non-medical: Not on file  Tobacco Use  . Smoking status: Former Smoker    Types: Cigarettes    Quit date: 07/14/1983    Years since quitting: 36.2  . Smokeless tobacco: Never Used  Substance and Sexual Activity  .  Alcohol use: No  . Drug use: No  . Sexual activity: Not on file  Lifestyle  . Physical activity    Days per week: Not on file    Minutes per session: Not on file  . Stress: Not on file  Relationships  . Social Herbalist on phone: Not on file    Gets together: Not on file    Attends religious service: Not on file    Active member of club or organization: Not on file    Attends meetings of clubs or organizations: Not on file    Relationship status: Not on file  . Intimate partner violence    Fear of current or ex partner: Not on file    Emotionally abused: Not on file    Physically abused: Not on file    Forced sexual activity: Not on file  Other Topics Concern   . Not on file  Social History Narrative  . Not on file     Review of Systems: General: negative for chills, fever, night sweats or weight changes.  Cardiovascular: negative for chest pain, dyspnea on exertion, edema, orthopnea, palpitations, paroxysmal nocturnal dyspnea or shortness of breath Dermatological: negative for rash Respiratory: negative for cough or wheezing Urologic: negative for hematuria Abdominal: negative for nausea, vomiting, diarrhea, bright red blood per rectum, melena, or hematemesis Neurologic: negative for visual changes, syncope, or dizziness All other systems reviewed and are otherwise negative except as noted above.    Blood pressure 114/62, pulse 66, temperature (!) 97.5 F (36.4 C), height 6' (1.829 m), weight 242 lb (109.8 kg).  General appearance: alert and no distress Neck: no adenopathy, no carotid bruit, no JVD, supple, symmetrical, trachea midline and thyroid not enlarged, symmetric, no tenderness/mass/nodules Lungs: clear to auscultation bilaterally Heart: regular rate and rhythm, S1, S2 normal, no murmur, click, rub or gallop Extremities: extremities normal, atraumatic, no cyanosis or edema Pulses: 2+ and symmetric Skin: Skin color, texture, turgor normal. No rashes or lesions Neurologic: Alert and oriented X 3, normal strength and tone. Normal symmetric reflexes. Normal coordination and gait  EKG sinus rhythm at 66 with incomplete right bundle branch block.  I personally reviewed this EKG.  ASSESSMENT AND PLAN:   Hyperlipidemia History of hyperlipidemia on statin therapy with lipid profile performed 08/01/2019 revealing total cholesterol of 103, LDL 42 and HDL 38.  Coronary artery disease History of CAD status post RCA PCI and drug-eluting stenting with a Cypher drug-eluting stent by myself December 2006.  Did have a 50% mid LAD lesion with normal LV function at that time.  He denies chest pain or shortness of breath.      Lorretta Harp  MD FACP,FACC,FAHA, [facility] 10/17/2019 3:41 PM

## 2019-10-17 NOTE — Assessment & Plan Note (Signed)
History of hyperlipidemia on statin therapy with lipid profile performed 08/01/2019 revealing total cholesterol of 103, LDL 42 and HDL 38.

## 2019-10-17 NOTE — Assessment & Plan Note (Signed)
History of CAD status post RCA PCI and drug-eluting stenting with a Cypher drug-eluting stent by myself December 2006.  Did have a 50% mid LAD lesion with normal LV function at that time.  He denies chest pain or shortness of breath.

## 2019-10-25 ENCOUNTER — Other Ambulatory Visit: Payer: Self-pay | Admitting: Cardiovascular Disease

## 2019-12-28 DIAGNOSIS — E7849 Other hyperlipidemia: Secondary | ICD-10-CM | POA: Diagnosis not present

## 2019-12-28 DIAGNOSIS — I10 Essential (primary) hypertension: Secondary | ICD-10-CM | POA: Diagnosis not present

## 2020-01-09 DIAGNOSIS — Z23 Encounter for immunization: Secondary | ICD-10-CM | POA: Diagnosis not present

## 2020-02-09 DIAGNOSIS — Z23 Encounter for immunization: Secondary | ICD-10-CM | POA: Diagnosis not present

## 2020-02-25 DIAGNOSIS — I10 Essential (primary) hypertension: Secondary | ICD-10-CM | POA: Diagnosis not present

## 2020-02-25 DIAGNOSIS — E7849 Other hyperlipidemia: Secondary | ICD-10-CM | POA: Diagnosis not present

## 2020-03-04 DIAGNOSIS — H353231 Exudative age-related macular degeneration, bilateral, with active choroidal neovascularization: Secondary | ICD-10-CM | POA: Diagnosis not present

## 2020-03-04 DIAGNOSIS — H269 Unspecified cataract: Secondary | ICD-10-CM | POA: Diagnosis not present

## 2020-03-04 DIAGNOSIS — H353211 Exudative age-related macular degeneration, right eye, with active choroidal neovascularization: Secondary | ICD-10-CM | POA: Diagnosis not present

## 2020-03-25 ENCOUNTER — Encounter: Payer: Self-pay | Admitting: Emergency Medicine

## 2020-03-25 ENCOUNTER — Ambulatory Visit
Admission: EM | Admit: 2020-03-25 | Discharge: 2020-03-25 | Disposition: A | Discharge status: Home / Self Care | Payer: Medicare Other | Attending: Emergency Medicine | Admitting: Emergency Medicine

## 2020-03-25 ENCOUNTER — Other Ambulatory Visit: Payer: Self-pay

## 2020-03-25 DIAGNOSIS — T83511A Infection and inflammatory reaction due to indwelling urethral catheter, initial encounter: Secondary | ICD-10-CM | POA: Insufficient documentation

## 2020-03-25 DIAGNOSIS — N390 Urinary tract infection, site not specified: Secondary | ICD-10-CM

## 2020-03-25 DIAGNOSIS — N99522 Malfunction of other external stoma of urinary tract: Secondary | ICD-10-CM | POA: Diagnosis not present

## 2020-03-25 DIAGNOSIS — N368 Other specified disorders of urethra: Secondary | ICD-10-CM

## 2020-03-25 DIAGNOSIS — L03031 Cellulitis of right toe: Secondary | ICD-10-CM | POA: Diagnosis not present

## 2020-03-25 DIAGNOSIS — Z6833 Body mass index (BMI) 33.0-33.9, adult: Secondary | ICD-10-CM | POA: Diagnosis not present

## 2020-03-25 DIAGNOSIS — B351 Tinea unguium: Secondary | ICD-10-CM | POA: Diagnosis not present

## 2020-03-25 LAB — POCT URINALYSIS DIP (MANUAL ENTRY)
Bilirubin, UA: NEGATIVE
Glucose, UA: NEGATIVE mg/dL
Ketones, POC UA: NEGATIVE mg/dL
Nitrite, UA: NEGATIVE
Protein Ur, POC: 100 mg/dL — AB
Spec Grav, UA: 1.025 (ref 1.010–1.025)
Urobilinogen, UA: 0.2 E.U./dL
pH, UA: 7 (ref 5.0–8.0)

## 2020-03-25 MED ORDER — CIPROFLOXACIN HCL 500 MG PO TABS: 500.0000 mg | ORAL_TABLET | Freq: Two times a day (BID) | ORAL | 0 refills | Status: DC

## 2020-03-25 NOTE — Discharge Instructions (Signed)
Will treat for urinary tract infection Urine culture sent.  We will call you with the results.   Push fluids and get plenty of rest.   Take antibiotic as directed and to completion Follow up with PCP or urology this week for recheck and to ensure symptoms are improving Return here or go to ER if you have any new or worsening symptoms such as fever, abdominal pain, nausea/vomiting, flank pain, persistent symptoms despite medication, etc..Marland Kitchen

## 2020-03-25 NOTE — ED Triage Notes (Signed)
Opening to urethra is reported to be infected.  Patient caths himself

## 2020-03-25 NOTE — ED Provider Notes (Signed)
[facility]   CC: Urethral infection  SUBJECTIVE:  Caleb [name] is a 86 y.o. male hx significant for penis cancer who self caths at home twice daily, who complains of infection in urethra x 1 week.  Patient denies a precipitating event, other than self-cathing.  Denies pain.  Describes it as tingling with urination.  Has NOT tried OTC medications.  Symptoms are made worse with urination.  Admits to similar symptoms in the past with infection.  Denies fever, chills, nausea, vomiting, abdominal pain, flank pain, urethral discharge, concern for STDs.    LMP: No LMP for male patient.  ROS: As in HPI.  All other pertinent ROS negative.     Past Medical History:  Diagnosis Date  . Coronary artery disease   . Hyperlipidemia   . Hyperlipidemia   . Obesity   . Penile cancer ([place])   . Swelling of extremity    Left Leg   Past Surgical History:  Procedure Laterality Date  . APPENDECTOMY    . BACK SURGERY    . CHOLECYSTECTOMY    . CORONARY ANGIOPLASTY  11/2005   RCA PCI AND STENTING WITH A CYPHER DES  . CORONARY ARTERY BYPASS GRAFT    . HIATAL HERNIA REPAIR    . lymph node removal    . NM MYOCAR MULTIPLE W/SPECT  11/26/2009   EF 62%. NORMAL MYOCARDIAL PERFUSION STUDY.  . TRANSTHORACIC ECHOCARDIOGRAM  12/22/2005   MILD TO MOD AORTIC SCLEROSIS W/O STENOSIS. MILD TO MODERATE MITRAL CALCIFICATION. LA- MILDLY DILATED.   No Known Allergies No current facility-administered medications on file prior to encounter.   Current Outpatient Medications on File Prior to Encounter  Medication Sig Dispense Refill  . Ascorbic Acid (VITAMIN C) 1000 MG tablet Take 1,000 mg by mouth daily.     Marland Kitchen aspirin EC 81 MG tablet Take 81 mg by mouth daily.    . Calcium Carbonate-Vitamin D (CALCIUM-D PO) Take 1 tablet by mouth daily.    . clopidogrel (PLAVIX) 75 MG tablet TAKE 1 TABLET EVERY DAY 90 tablet 2  . metoprolol tartrate (LOPRESSOR) 25 MG tablet Take 1 tablet (25 mg total) by mouth 2 (two)  times daily. 180 tablet 2  . Multiple Vitamin (MULTIVITAMIN WITH MINERALS) TABS tablet Take 1 tablet by mouth daily. Centrum Silver    . Multiple Vitamins-Minerals (OCUVITE PRESERVISION PO) Take 1 tablet by mouth daily as needed (For eye health).    . simvastatin (ZOCOR) 20 MG tablet TAKE 1 TABLET (20 MG TOTAL) BY MOUTH AT BEDTIME. 90 tablet 2  . acetaminophen (TYLENOL) 325 MG tablet Take 1-2 tablets (325-650 mg total) by mouth every 6 (six) hours as needed for fever, headache, mild pain or moderate pain.    Marland Kitchen docusate sodium (COLACE) 100 MG capsule Take 1 capsule (100 mg total) by mouth every 12 (twelve) hours. 20 capsule 0  . magnesium hydroxide (MILK OF MAGNESIA) 400 MG/5ML suspension Take 30 mLs by mouth daily as needed for mild constipation.    . Nystatin (NYAMYC) 100000 UNIT/GM POWD Apply topically to affected area(s) twice daily  1  . polyethylene glycol powder (GLYCOLAX/MIRALAX) powder Take 17 g by mouth daily as needed for mild constipation or moderate constipation.     Social History   Socioeconomic History  . Marital status: Divorced    Spouse name: Not on file  . Number of children: Not on file  . Years of education: Not on file  . Highest education level: Not on file  Occupational History  . Occupation: retired    Fish farm manager: RETIRED  Tobacco Use  . Smoking status: Former Smoker    Types: Cigarettes    Quit date: 07/14/1983    Years since quitting: 36.7  . Smokeless tobacco: Never Used  Substance and Sexual Activity  . Alcohol use: No  . Drug use: No  . Sexual activity: Not on file  Other Topics Concern  . Not on file  Social History Narrative  . Not on file   Social Determinants of Health   Financial Resource Strain:   . Difficulty of Paying Living Expenses:   Food Insecurity:   . Worried About Charity fundraiser in the Last Year:   . Arboriculturist in the Last Year:   Transportation Needs:   . Film/video editor (Medical):   Marland Kitchen Lack of Transportation  (Non-Medical):   Physical Activity:   . Days of Exercise per Week:   . Minutes of Exercise per Session:   Stress:   . Feeling of Stress :   Social Connections:   . Frequency of Communication with Friends and Family:   . Frequency of Social Gatherings with Friends and Family:   . Attends Religious Services:   . Active Member of Clubs or Organizations:   . Attends Archivist Meetings:   Marland Kitchen Marital Status:   Intimate Partner Violence:   . Fear of Current or Ex-Partner:   . Emotionally Abused:   Marland Kitchen Physically Abused:   . Sexually Abused:    Family History  Problem Relation Age of Onset  . Cancer Father   . Cancer Sister   . Cancer Brother   . Cancer Sister   . Cancer Other     OBJECTIVE:  Vitals:   03/25/20 1043  BP: 137/74  Pulse: 81  Resp: 20  Temp: 98.2 F (36.8 C)  TempSrc: Oral  SpO2: 94%   General appearance: Alert in no acute distress HEENT: NCAT.  Oropharynx clear.  Lungs: clear to auscultation bilaterally without adventitious breath sounds Heart: regular rate and rhythm.  Abdomen: soft; non-distended; no tenderness; bowel sounds present; no guarding GU: [name] present. Penis absent, urethral opening with slight clear discharge and erythema, superficial cuts in 4 and 7 o'clock position with retracting urethral opening Skin: warm and dry Neurologic: Uses a [name] Psychological: alert and cooperative; normal mood and affect  Labs Reviewed  POCT URINALYSIS DIP (MANUAL ENTRY) - Abnormal; Notable for the following components:      Result Value   Clarity, UA cloudy (*)    Blood, UA large (*)    Protein Ur, POC =100 (*)    Leukocytes, UA Small (1+) (*)    All other components within normal limits  URINE CULTURE    ASSESSMENT & PLAN:  1. Urethral irritation   2. Urinary tract infection associated with catheterization of urinary tract, unspecified indwelling urinary catheter type, initial encounter ([place])     Meds ordered this encounter   Medications  . ciprofloxacin (CIPRO) 500 MG tablet    Sig: Take 1 tablet (500 mg total) by mouth 2 (two) times daily.    Dispense:  14 tablet    Refill:  0    Order Specific Question:   Supervising Provider    Answer:   Raylene Everts [id number]   In and out cath performed to obtain urine sample.    Will treat for urinary tract infection Urine culture sent.  We will call you  with the results.   Push fluids and get plenty of rest.   Take antibiotic as directed and to completion Follow up with PCP or urology this week for recheck and to ensure symptoms are improving Return here or go to ER if you have any new or worsening symptoms such as fever, abdominal pain, nausea/vomiting, flank pain, persistent symptoms despite medication, etc...  Outlined signs and symptoms indicating need for more acute intervention. Patient verbalized understanding. After Visit Summary given.     Lestine Box, PA-C 03/25/20 1225

## 2020-03-26 ENCOUNTER — Telehealth: Payer: Self-pay | Admitting: Urology

## 2020-03-26 LAB — URINE CULTURE: Special Requests: NORMAL

## 2020-03-26 NOTE — Telephone Encounter (Signed)
Pt coming in 3/31 at 915.

## 2020-03-26 NOTE — Telephone Encounter (Signed)
Patient called and lm. He stated he was seen in the er on 03/25/20 for uti and he asked for a return call from our office. I called him at 8:16am and lm for pt.

## 2020-03-27 ENCOUNTER — Other Ambulatory Visit: Payer: Self-pay

## 2020-03-27 ENCOUNTER — Ambulatory Visit (INDEPENDENT_AMBULATORY_CARE_PROVIDER_SITE_OTHER): Payer: Medicare Other | Admitting: Urology

## 2020-03-27 ENCOUNTER — Encounter: Payer: Self-pay | Admitting: Urology

## 2020-03-27 VITALS — BP 128/54 | HR 89 | Temp 97.5°F | Ht 70.0 in | Wt 246.0 lb

## 2020-03-27 DIAGNOSIS — R339 Retention of urine, unspecified: Secondary | ICD-10-CM

## 2020-03-27 HISTORY — DX: Retention of urine, unspecified: R33.9

## 2020-03-27 NOTE — Progress Notes (Signed)
03/27/2020 10:01 AM   Caleb [name] [birth year] [id number]  Referring provider: Lorretta Harp, [address] [place],  [place] [address]  hematuria  HPI: Caleb [name] is a 86yo here for followup for hematuria and issues with perineal urethrostomy. He performs CIC 2-3x per day. He was seen at urgent care on 3/29 and had a urethrostomy dilation and developed hematuria which subsequently resolved. No issues performing CIC currently.   Records from [facility] are as follows: Caleb [name] is sent back following an ER visit on 1/30 in [place] for urinary retention. I last saw him in late 2015 and found a probable penile cancer for which he was referred to Dr. Dimas Millin at [facility]. He had a long history of a hypotonic bladder with a prior TURP in 2009 by Dr. Michela Pitcher and had been on chronic CIC TID. He had a radical penectomy at [facility] in early 2016 and has continued to cath via a perineal urethrostomy but presented to the ER with the inability to cath reliably for 6-7 days but was able to void a little bit. A foley was placed with 2077m return. 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 [facility] showed [facility] recurrence.   01/28/2018: Pt was seen in the ER last week with inability to perform CIC via his perineal urethrostomy. He had a Foley in place.   01/11/2019: He continues to perform CIC BID, sometimes TID. Over the past week he reports decreased urine output when he performs CIC, he only states getting between 4 and 500 cc at a time. He has also been constipated. He reports since of incomplete emptying, cloudy malodorous urine. He reports some mild irritation when inserting his catheters. He has not seen gross hematuria. Denies fever/chills. No new flank or back pain. He last performed CIC at 8 AM than prior to that at 4 AM getting around 400 mL drained each time. He is requesting an indwelling Foley catheter.    02/22/2019: The patient had an indwelling foley until 1 week ago when it fell out. He has been performing CIC 3-4 times per day.   04/11/2019: Pt instructed to continue CIC q 4 hours while awake. An indwelling catheter was not replaced.   Presents today with increasing difficulty performing CIC occurring over the last couple of days. He states he felt there was some skin growth over his uretherostomy. He was able to push through this initially but did note some bleeding which resolved. He has been fearful to perform CIC because of this. He last drained his bladder yesterday. Denies leaking, previous gross hematuria, painful urgency, f/c or other s/s of systemic infection.   04/18/2019: He returns today for catheter removal after indwelling foley was placed at his request last week. 6760mdrained from the bladder, he had not performed CIC since the evening before citing difficulty with insertion. Indwelling catheter was noted to insert without resistance or noted difficulty. His uretherostomy was visibly patent. Urine c/s assessed from catheter specimen showed mixed growth with gross contamination. I did not provide abx coverage since he was not exhibiting s/s of acute bladder or systemic infection. Today he reports no problems over the past week with the indwelling catheter. Denies painful urgency or leaking. Denies gross hematuria. Constitutionally he feels well without f/c, n/v, malaise or generalized body aches. No new unilateral flank or lower back pain.    05/18/2019: He  returns this afternoon c/o inability to place his I/o catheter to drain his bladder. He states he feels as if there is an obstruction and something blocking him from inserting the catheter. I have now seen him 4 times this year for the same issue. Consistently there has been no obvious visual obstruction at his uretherostomy and inserting an indwelling catheter by myself or nursing staff has not demonstrated any resistance with  insertion. Today c/o inability to perform CIC with associated increased s/p pressure. He is not leaking urine. No recent blood in the urine. Denies new back or flank pain. No f/c or other constitutional s/s of infection. He tells me his physician at [facility] wants to place an s/p tube later this year but that has not been scheduled.   08/02/2019: he is catheterizes 1-2x per day. No UTIS. No hematuria     PMH: Past Medical History:  Diagnosis Date  . Coronary artery disease   . Hyperlipidemia   . Hyperlipidemia   . Obesity   . Penile cancer ([facility])   . Swelling of extremity    Left Leg    Surgical History: Past Surgical History:  Procedure Laterality Date  . APPENDECTOMY    . BACK SURGERY    . CHOLECYSTECTOMY    . CORONARY ANGIOPLASTY  11/2005   RCA PCI AND STENTING WITH A CYPHER DES  . CORONARY ARTERY BYPASS GRAFT    . HIATAL HERNIA REPAIR    . lymph node removal    . NM MYOCAR MULTIPLE W/SPECT  11/26/2009   EF 62%. NORMAL MYOCARDIAL PERFUSION STUDY.  . TRANSTHORACIC ECHOCARDIOGRAM  12/22/2005   MILD TO MOD AORTIC SCLEROSIS W/O STENOSIS. MILD TO MODERATE MITRAL CALCIFICATION. LA- MILDLY DILATED.    Home Medications:  Allergies as of 03/27/2020   No Known Allergies     Medication List       Accurate as of March 27, 2020 10:01 AM. If you have any questions, ask your nurse or doctor.        acetaminophen 325 MG tablet Commonly known as: TYLENOL Take 1-2 tablets (325-650 mg total) by mouth every 6 (six) hours as needed for fever, headache, mild pain or moderate pain.   aspirin EC 81 MG tablet Take 81 mg by mouth daily.   CALCIUM-D PO Take 1 tablet by mouth daily.   ciprofloxacin 500 MG tablet Commonly known as: CIPRO Take 1 tablet (500 mg total) by mouth 2 (two) times daily.   clopidogrel 75 MG tablet Commonly known as: PLAVIX TAKE 1 TABLET EVERY DAY   docusate sodium 100 MG capsule Commonly known as: COLACE Take 1 capsule (100 mg total) by mouth every 12  (twelve) hours.   magnesium hydroxide 400 MG/5ML suspension Commonly known as: MILK OF MAGNESIA Take 30 mLs by mouth daily as needed for mild constipation.   metoprolol tartrate 25 MG tablet Commonly known as: LOPRESSOR Take 1 tablet (25 mg total) by mouth 2 (two) times daily.   multivitamin with minerals Tabs tablet Take 1 tablet by mouth daily. Centrum Silver   Nyamyc powder Generic drug: nystatin Apply topically to affected area(s) twice daily   OCUVITE PRESERVISION PO Take 1 tablet by mouth daily as needed (For eye health).   PreserVision AREDS 2 Caps Take by mouth.   polyethylene glycol powder 17 GM/SCOOP powder Commonly known as: GLYCOLAX/MIRALAX Take 17 g by mouth daily as needed for mild constipation or moderate constipation.   simvastatin 20 MG tablet Commonly known as: ZOCOR TAKE 1   TABLET (20 MG TOTAL) BY MOUTH AT BEDTIME.   vitamin C 1000 MG tablet Take 1,000 mg by mouth daily.       Allergies: No Known Allergies  Family History: Family History  Problem Relation Age of Onset  . Cancer Father   . Cancer Sister   . Cancer Brother   . Cancer Sister   . Cancer Other     Social History:  reports that he quit smoking about 36 years ago. His smoking use included cigarettes. He has never used smokeless tobacco. He reports that he does not drink alcohol or use drugs.  ROS: All other review of systems were reviewed and are negative except what is noted above in HPI  Physical Exam: BP (!) 128/54   Pulse 89   Temp (!) 97.5 F (36.4 C)   Ht 5' 10" (1.778 m)   Wt 246 lb (111.6 kg)   BMI 35.30 kg/m   Constitutional:  Alert and oriented, No acute distress. HEENT: [place] AT, moist mucus membranes.  Trachea midline, no masses. Cardiovascular: No clubbing, cyanosis, or edema. Respiratory: Normal respiratory effort, no increased work of breathing. GI: Abdomen is soft, nontender, nondistended, no abdominal masses GU: No CVA tenderness Lymph: No cervical or  inguinal lymphadenopathy. Skin: No rashes, bruises or suspicious lesions. Neurologic: Grossly intact, no focal deficits, moving all 4 extremities. Psychiatric: Normal mood and affect.  Laboratory Data: Lab Results  Component Value Date   WBC 5.7 03/17/2016   HGB 12.6 (L) 01/07/2018   HCT 37.0 (L) 01/07/2018   MCV 93.0 03/17/2016   PLT 201 03/17/2016    Lab Results  Component Value Date   CREATININE 1.20 01/07/2018    No results found for: PSA  No results found for: TESTOSTERONE  No results found for: HGBA1C  Urinalysis    Component Value Date/Time   COLORURINE YELLOW 01/09/2018 0830   APPEARANCEUR CLOUDY (A) 01/09/2018 0830   LABSPEC 1.014 01/09/2018 0830   PHURINE 6.0 01/09/2018 0830   GLUCOSEU NEGATIVE 01/09/2018 0830   HGBUR SMALL (A) 01/09/2018 0830   BILIRUBINUR negative 03/25/2020 1156   KETONESUR negative 03/25/2020 1156   KETONESUR 20 (A) 01/09/2018 0830   PROTEINUR =100 (A) 03/25/2020 1156   PROTEINUR 100 (A) 01/09/2018 0830   UROBILINOGEN 0.2 03/25/2020 1156   NITRITE Negative 03/25/2020 1156   NITRITE NEGATIVE 01/09/2018 0830   LEUKOCYTESUR Small (1+) (A) 03/25/2020 1156    Lab Results  Component Value Date   BACTERIA MANY (A) 01/09/2018    Pertinent Imaging: No results found for this or any previous visit. Results for orders placed during the hospital encounter of 05/13/05  US VenoUS Imaging Bilateral   Narrative Clinical data: Bilateral lower extremity edema and pain     Bilateral lower extremity venous Doppler ultrasound:   The lower extremity deep venous system demonstrates normal compressibility, phasicity, and augmentation.   No evidence of DVT.   Impression: 1. Negative for   lower extremity DVT  Provider: Mertie Clause   No results found for this or any previous visit. No results found for this or any previous visit. No results found for this or any previous visit. No results found for this or any previous visit. No results  found for this or any previous visit. No results found for this or any previous visit.  Assessment & Plan:    1. Urinary retention -continue CIC 2-3x per day. -RTC 6 months   No follow-ups on file.  Nicolette Bang, MD  [facility] [place]

## 2020-03-27 NOTE — Progress Notes (Signed)
Urological Symptom Review  Patient is experiencing the following symptoms: Blood in urine   Review of Systems  Gastrointestinal (upper)  : Negative for upper GI symptoms  Gastrointestinal (lower) : Negative for lower GI symptoms  Constitutional : Negative for symptoms  Skin: Negative for skin symptoms  Eyes: Negative for eye symptoms  Ear/Nose/Throat : Negative for Ear/Nose/Throat symptoms  Hematologic/Lymphatic: Easy bruising  Cardiovascular : Negative for cardiovascular symptoms  Respiratory : Negative for respiratory symptoms  Endocrine: Negative for endocrine symptoms  Musculoskeletal: Negative for musculoskeletal symptoms  Neurological: Negative for neurological symptoms  Psychologic: Negative for psychiatric symptoms

## 2020-03-27 NOTE — Patient Instructions (Signed)
Acute Urinary Retention, Male ° °Acute urinary retention means that you cannot pee (urinate) at all, or that you pee too little and your bladder is not emptied completely. If it is not treated, it can lead to kidney damage or other serious problems. °Follow these instructions at home: °· Take over-the-counter and prescription medicines only as told by your doctor. Ask your doctor what medicines you should stay away from. Do not take any medicine unless your doctor says it is okay to do so. °· If you were sent home with a tube that drains the bladder (catheter), take care of it as told by your doctor. °· Drink enough fluid to keep your pee clear or pale yellow. °· If you were given an antibiotic, take it as told by your doctor. Do not stop taking the antibiotic even if you start to feel better. °· Do not use any products that contain nicotine or tobacco, such as cigarettes and e-cigarettes. If you need help quitting, ask your doctor. °· Watch for changes in your symptoms. Tell your doctor about them. °· If told, track changes in your blood pressure at home. Tell your doctor about them. °· Keep all follow-up visits as told by your doctor. This is important. °Contact a doctor if: °· You have spasms or you leak pee when you have spasms. °Get help right away if: °· You have chills or a fever. °· You have a tube that drains the bladder and: °? The tube stops draining pee. °? The tube falls out. °· You have blood in your pee. °Summary °· Acute urinary retention means that you have problems peeing. It may mean that you cannot pee at all, or that you pee too little. °· If this condition is not treated, it can lead to kidney damage or other serious problems. °· If you were sent home with a tube that drains the bladder, take care of it as told by your doctor. °· Monitor any changes in your symptoms. Tell your doctor about any changes. °This information is not intended to replace advice given to you by your health care  provider. Make sure you discuss any questions you have with your health care provider. °Document Revised: 03/02/2019 Document Reviewed: 01/15/2017 °Elsevier Patient Education © 2020 [employer] Inc. ° °

## 2020-04-26 ENCOUNTER — Telehealth: Payer: Self-pay | Admitting: Urology

## 2020-04-26 DIAGNOSIS — E6609 Other obesity due to excess calories: Secondary | ICD-10-CM | POA: Diagnosis not present

## 2020-04-26 DIAGNOSIS — Z6834 Body mass index (BMI) 34.0-34.9, adult: Secondary | ICD-10-CM | POA: Diagnosis not present

## 2020-04-26 DIAGNOSIS — I10 Essential (primary) hypertension: Secondary | ICD-10-CM | POA: Diagnosis not present

## 2020-04-26 DIAGNOSIS — I2510 Atherosclerotic heart disease of native coronary artery without angina pectoris: Secondary | ICD-10-CM | POA: Diagnosis not present

## 2020-04-26 DIAGNOSIS — C61 Malignant neoplasm of prostate: Secondary | ICD-10-CM | POA: Diagnosis not present

## 2020-04-26 DIAGNOSIS — C609 Malignant neoplasm of penis, unspecified: Secondary | ICD-10-CM | POA: Diagnosis not present

## 2020-04-26 DIAGNOSIS — K5909 Other constipation: Secondary | ICD-10-CM | POA: Diagnosis not present

## 2020-04-26 DIAGNOSIS — E7849 Other hyperlipidemia: Secondary | ICD-10-CM | POA: Diagnosis not present

## 2020-04-26 NOTE — Telephone Encounter (Signed)
Pt called and stated he was having issues with is cath and would like a call back.

## 2020-04-29 NOTE — Telephone Encounter (Signed)
Pt reports he had issues with constipation and catheter problems have since resolved

## 2020-05-14 ENCOUNTER — Other Ambulatory Visit: Payer: Self-pay | Admitting: *Deleted

## 2020-05-14 DIAGNOSIS — I739 Peripheral vascular disease, unspecified: Secondary | ICD-10-CM

## 2020-05-16 ENCOUNTER — Ambulatory Visit (HOSPITAL_COMMUNITY)
Admission: RE | Admit: 2020-05-16 | Discharge: 2020-05-16 | Disposition: A | Discharge status: Home / Self Care | Payer: Medicare Other | Source: Ambulatory Visit | Attending: Surgery | Admitting: Surgery

## 2020-05-16 ENCOUNTER — Ambulatory Visit (INDEPENDENT_AMBULATORY_CARE_PROVIDER_SITE_OTHER): Payer: Medicare Other | Admitting: Vascular Surgery

## 2020-05-16 ENCOUNTER — Other Ambulatory Visit: Payer: Self-pay

## 2020-05-16 ENCOUNTER — Encounter: Payer: Self-pay | Admitting: Vascular Surgery

## 2020-05-16 VITALS — BP 125/81 | HR 64 | Temp 97.7°F | Resp 20 | Ht 70.0 in | Wt 246.0 lb

## 2020-05-16 DIAGNOSIS — I890 Lymphedema, not elsewhere classified: Secondary | ICD-10-CM

## 2020-05-16 DIAGNOSIS — I739 Peripheral vascular disease, unspecified: Secondary | ICD-10-CM

## 2020-05-16 NOTE — Progress Notes (Addendum)
Patient is a 86 year old male referred for leg swelling.  He was seen for similar problems June 2016.  He has been seen on several other prior occasions for similar symptoms.  At that time he had a CT scan of the abdomen and pelvis as well as a venous duplex which showed no evidence of pelvic lymphadenopathy.  He did have a prior penectomy and bilateral lymph node dissection.  He is also had previous laser ablation of his right greater saphenous vein at [facility] and duplex in 2016 showed that this had been successful.  He did have deep vein reflux in the common femoral vein.  At that time compression stockings were recommended.  Patient states that he thinks his leg has slowly progressively been getting more swollen over the last several years.  He has no wounds on his feet currently.  He does have thickened toenails on both feet.  He is able to ambulate with the assistance of a [place].  The patient has been followed for approximately 4 years and not really had significant relief with compression wraps or stockings.  He has also not had significant relief with elevation or exercise.    Past Medical History:  Diagnosis Date  . Coronary artery disease   . Hyperlipidemia   . Hyperlipidemia   . Obesity   . Penile cancer ([place])   . Swelling of extremity    Left Leg    Past Surgical History:  Procedure Laterality Date  . APPENDECTOMY    . BACK SURGERY    . CHOLECYSTECTOMY    . CORONARY ANGIOPLASTY  11/2005   RCA PCI AND STENTING WITH A CYPHER DES  . CORONARY ARTERY BYPASS GRAFT    . HIATAL HERNIA REPAIR    . lymph node removal    . NM MYOCAR MULTIPLE W/SPECT  11/26/2009   EF 62%. NORMAL MYOCARDIAL PERFUSION STUDY.  . TRANSTHORACIC ECHOCARDIOGRAM  12/22/2005   MILD TO MOD AORTIC SCLEROSIS W/O STENOSIS. MILD TO MODERATE MITRAL CALCIFICATION. LA- MILDLY DILATED.    Current Outpatient Medications on File Prior to Visit  Medication Sig Dispense Refill  . acetaminophen (TYLENOL) 325 MG  tablet Take 1-2 tablets (325-650 mg total) by mouth every 6 (six) hours as needed for fever, headache, mild pain or moderate pain.    . Ascorbic Acid (VITAMIN C) 1000 MG tablet Take 1,000 mg by mouth daily.     Marland Kitchen aspirin EC 81 MG tablet Take 81 mg by mouth daily.    . Calcium Carbonate-Vitamin D (CALCIUM-D PO) Take 1 tablet by mouth daily.    . clopidogrel (PLAVIX) 75 MG tablet TAKE 1 TABLET EVERY DAY 90 tablet 2  . docusate sodium (COLACE) 100 MG capsule Take 1 capsule (100 mg total) by mouth every 12 (twelve) hours. 20 capsule 0  . magnesium hydroxide (MILK OF MAGNESIA) 400 MG/5ML suspension Take 30 mLs by mouth daily as needed for mild constipation.    . metoprolol tartrate (LOPRESSOR) 25 MG tablet Take 1 tablet (25 mg total) by mouth 2 (two) times daily. 180 tablet 2  . Multiple Vitamin (MULTIVITAMIN WITH MINERALS) TABS tablet Take 1 tablet by mouth daily. Centrum Silver    . Multiple Vitamins-Minerals (PRESERVISION AREDS 2) CAPS Take by mouth.    . polyethylene glycol powder (GLYCOLAX/MIRALAX) powder Take 17 g by mouth daily as needed for mild constipation or moderate constipation.    . simvastatin (ZOCOR) 20 MG tablet TAKE 1 TABLET (20 MG TOTAL) BY MOUTH AT BEDTIME. 90 tablet  2  . Nystatin (NYAMYC) 100000 UNIT/GM POWD Apply topically to affected area(s) twice daily  1   No current facility-administered medications on file prior to visit.    Review of systems: He has shortness of breath with exertion.  He does not have chest pain.  Physical exam:  Vitals:   05/16/20 1345  BP: 125/81  Pulse: 64  Resp: 20  Temp: 97.7 F (36.5 C)  SpO2: 95%  Weight: 246 lb (111.6 kg)  Height: 5\' 10"  (1.778 m)    Extremities: 2+ dorsalis pedis pulses bilaterally  Musculoskeletal thickened woody edema nonpitting right lower extremity and left lower extremity the right is worse than the left.  He has brawny staining circumferentially extending from the ankle to the knee bilaterally.  Again right is  worse than left.  Skin: No ulcer  Data: Patient had duplex ultrasound today for reflux.  This showed common femoral vein reflux on the right side.  Greater saphenous vein has been ablated bilaterally.  No evidence of DVT.  Assessment: Unfortunately not many options for this patient's leg swelling.  It is probably multifactorial with venous hypertension deep vein reflux as well as lymphedema from his previous lymphadenectomy.  Due to his age he is unable to get on compression stockings as he does not have the physical strength for this.  Ace wraps are probably not going to be very helpful as well because they will not be tight enough.  He has tried compression and Ace wraps in the past as well as elevation and exercise with minimal relief of symptoms.  Plan: We will see if the patient can be evaluated and fitted for lymphedema pump for the right leg to relieve his symptoms.  We will also get him a podiatry visit for the thickened toenails on his feet to prevent them from being avulsed.  Otherwise he will follow up with [place] on an as-needed basis.  Ruta Hinds, MD [facility] Office: [phone]

## 2020-05-17 ENCOUNTER — Other Ambulatory Visit (HOSPITAL_COMMUNITY)
Admission: AD | Admit: 2020-05-17 | Discharge: 2020-05-17 | Disposition: A | Discharge status: Home / Self Care | Payer: Medicare Other | Source: Skilled Nursing Facility | Attending: Urology | Admitting: Urology

## 2020-05-17 ENCOUNTER — Telehealth: Payer: Self-pay | Admitting: Urology

## 2020-05-17 ENCOUNTER — Ambulatory Visit (INDEPENDENT_AMBULATORY_CARE_PROVIDER_SITE_OTHER): Payer: Medicare Other

## 2020-05-17 DIAGNOSIS — R339 Retention of urine, unspecified: Secondary | ICD-10-CM | POA: Diagnosis not present

## 2020-05-17 MED ORDER — CEFPODOXIME PROXETIL 200 MG PO TABS: 200.0000 mg | ORAL_TABLET | Freq: Two times a day (BID) | ORAL | 0 refills | Status: DC

## 2020-05-17 NOTE — Telephone Encounter (Signed)
Pt having cath issues. Requests a nurse call him back.

## 2020-05-17 NOTE — Telephone Encounter (Signed)
Pt called this afternoon stating he is now having trouble doing his CIC that he was doing since March. Reports he has increased issues with constipation and feels like he is unable to pass the catheters through. Pt reports he goes to the bathroom and voids very little. Pt requesting to come to office today and have catheter inserted. Please advise

## 2020-05-17 NOTE — Progress Notes (Signed)
Simple Catheter Placement  Due to urinary retention patient is present today for a foley cath placement.  Patient was cleaned and prepped in a sterile fashion with betadine. A 16 FR foley catheter was inserted, urine return was noted  66ml, urine was cloudy yellow in color.  The balloon was filled with 10cc of sterile water.  A leg bag was attached for drainage. Patient was also given a night bag to take home and was given instruction on how to change from one bag to another. Output was 1400cc.  Patient was given instruction on proper catheter care.  Patient tolerated well, no complications were noted   Performed by: Nakota Elsen, lpn  Additional notes/ Follow up: 1 week

## 2020-05-21 LAB — URINE CULTURE: Culture: >=100000 — AB

## 2020-05-24 ENCOUNTER — Ambulatory Visit (INDEPENDENT_AMBULATORY_CARE_PROVIDER_SITE_OTHER): Payer: Medicare Other

## 2020-05-24 ENCOUNTER — Other Ambulatory Visit: Payer: Self-pay

## 2020-05-24 DIAGNOSIS — R339 Retention of urine, unspecified: Secondary | ICD-10-CM

## 2020-05-24 NOTE — Progress Notes (Signed)
Catheter Removal  Patient is present today for a catheter removal.  83ml of water was drained from the balloon. A 16FR foley cath was removed from the bladder no complications were noted . Patient tolerated well.  Performed by: Antionette Char, Kyandra Mcclaine,LPN  Follow up/ Additional notes: PRN

## 2020-05-29 DIAGNOSIS — N319 Neuromuscular dysfunction of bladder, unspecified: Secondary | ICD-10-CM | POA: Diagnosis not present

## 2020-05-29 DIAGNOSIS — C609 Malignant neoplasm of penis, unspecified: Secondary | ICD-10-CM | POA: Diagnosis not present

## 2020-05-29 DIAGNOSIS — B356 Tinea cruris: Secondary | ICD-10-CM | POA: Diagnosis not present

## 2020-06-07 DIAGNOSIS — H43813 Vitreous degeneration, bilateral: Secondary | ICD-10-CM | POA: Diagnosis not present

## 2020-06-07 DIAGNOSIS — H2513 Age-related nuclear cataract, bilateral: Secondary | ICD-10-CM | POA: Diagnosis not present

## 2020-06-07 DIAGNOSIS — H35363 Drusen (degenerative) of macula, bilateral: Secondary | ICD-10-CM | POA: Diagnosis not present

## 2020-06-07 DIAGNOSIS — H353231 Exudative age-related macular degeneration, bilateral, with active choroidal neovascularization: Secondary | ICD-10-CM | POA: Diagnosis not present

## 2020-06-17 ENCOUNTER — Other Ambulatory Visit: Payer: Self-pay

## 2020-06-17 ENCOUNTER — Ambulatory Visit (INDEPENDENT_AMBULATORY_CARE_PROVIDER_SITE_OTHER): Payer: Medicare Other | Admitting: Podiatry

## 2020-06-17 DIAGNOSIS — M79674 Pain in right toe(s): Secondary | ICD-10-CM | POA: Diagnosis not present

## 2020-06-17 DIAGNOSIS — B351 Tinea unguium: Secondary | ICD-10-CM | POA: Diagnosis not present

## 2020-06-17 DIAGNOSIS — M79675 Pain in left toe(s): Secondary | ICD-10-CM | POA: Diagnosis not present

## 2020-06-17 DIAGNOSIS — I739 Peripheral vascular disease, unspecified: Secondary | ICD-10-CM | POA: Diagnosis not present

## 2020-06-18 ENCOUNTER — Encounter: Payer: Self-pay | Admitting: Podiatry

## 2020-06-18 NOTE — Progress Notes (Signed)
  Subjective:  Patient ID: Caleb [name], male    DOB: [birth year],  MRN: [id number]  Chief Complaint  Patient presents with  . Nail Problem    Pt is concerned about the thickening of toenails bilateral. Pt also has callouses bilateral.    86 y.o. male returns for the above complaint.  Patient presents with thickened elongated dystrophic toenails x10.  Patient said they are painful to walk on.  Patient states that he would like to have them debrided down his causes him a lot of pain.  He denies any other acute complaints.  He has not been able to do it himself.  He has not seen anyone else prior to see me.  Objective:  There were no vitals filed for this visit. Podiatric Exam: Vascular: dorsalis pedis and posterior tibial pulses are palpable bilateral. Capillary return is immediate. Temperature gradient is WNL. Skin turgor WNL  Sensorium: Normal Semmes Weinstein monofilament test. Normal tactile sensation bilaterally. Nail Exam: Pt has thick disfigured discolored nails with subungual debris noted bilateral entire nail hallux through fifth toenails.  Pain on palpation to the nails. Ulcer Exam: There is no evidence of ulcer or pre-ulcerative changes or infection. Orthopedic Exam: Muscle tone and strength are WNL. No limitations in general ROM. No crepitus or effusions noted. HAV  B/L.  Hammer toes 2-5  B/L. Skin: No Porokeratosis. No infection or ulcers    Assessment & Plan:   1. Pain due to onychomycosis of toenails of both feet   2. Peripheral vascular disease, unspecified ([place])     Patient was evaluated and treated and all questions answered.  Onychomycosis with pain  -Nails palliatively debrided as below. -Educated on self-care  Procedure: Nail Debridement Rationale: pain  Type of Debridement: manual, sharp debridement. Instrumentation: Nail nipper, rotary burr. Number of Nails: 10  Procedures and Treatment: Consent by patient was obtained for treatment procedures. The  patient understood the discussion of treatment and procedures well. All questions were answered thoroughly reviewed. Debridement of mycotic and hypertrophic toenails, 1 through 5 bilateral and clearing of subungual debris. No ulceration, no infection noted.  Return Visit-Office Procedure: Patient instructed to return to the office for a follow up visit 3 months for continued evaluation and treatment.  Boneta Lucks, DPM    Return in about 3 months (around 09/17/2020) for f/u with Dr. Susa Griffins for rfc.

## 2020-06-26 DIAGNOSIS — I10 Essential (primary) hypertension: Secondary | ICD-10-CM | POA: Diagnosis not present

## 2020-06-26 DIAGNOSIS — E7849 Other hyperlipidemia: Secondary | ICD-10-CM | POA: Diagnosis not present

## 2020-07-02 ENCOUNTER — Other Ambulatory Visit: Payer: Self-pay

## 2020-07-02 ENCOUNTER — Ambulatory Visit (INDEPENDENT_AMBULATORY_CARE_PROVIDER_SITE_OTHER): Payer: Medicare Other

## 2020-07-02 DIAGNOSIS — R339 Retention of urine, unspecified: Secondary | ICD-10-CM | POA: Diagnosis not present

## 2020-07-02 NOTE — Progress Notes (Signed)
Simple Catheter Placement  Due to urinary retention patient is present today for a foley cath placement.  Patient was cleaned and prepped in a sterile fashion with betadine. A 16 FR foley catheter was inserted, urine return was noted  1856ml, urine was yellow in color.  The balloon was filled with 10cc of sterile water.  A leg bag was attached for drainage. Patient was given instruction on proper catheter care.  Patient tolerated well, no complications were noted   Performed by: Valentina Lucks ,lpn  Additional notes/ Follow up: 1 wk w/ voiding trial

## 2020-07-08 ENCOUNTER — Other Ambulatory Visit: Payer: Self-pay

## 2020-07-08 ENCOUNTER — Ambulatory Visit (INDEPENDENT_AMBULATORY_CARE_PROVIDER_SITE_OTHER): Payer: Medicare Other

## 2020-07-08 ENCOUNTER — Ambulatory Visit: Payer: Medicare Other

## 2020-07-08 DIAGNOSIS — R339 Retention of urine, unspecified: Secondary | ICD-10-CM | POA: Diagnosis not present

## 2020-07-08 NOTE — Patient Instructions (Signed)

## 2020-07-08 NOTE — Progress Notes (Signed)
Pt decided to keep in foley catheter. Appt. made for 1 month cath change.

## 2020-07-29 ENCOUNTER — Telehealth: Payer: Self-pay | Admitting: Urology

## 2020-07-29 ENCOUNTER — Ambulatory Visit: Payer: Medicare Other

## 2020-07-29 NOTE — Telephone Encounter (Signed)
Returned called and pt stated that his cath bag had busted and he took his catheter out. Pt stated that he is in & out cathing himself and that he wanted to cancel his upcoming appt on the 6th for a cath change. Pt informed to call office if he started having difficulty catherizing. Pt voiced understanding.

## 2020-07-29 NOTE — Telephone Encounter (Signed)
Patient called and requests a nurse call him about cath bag.

## 2020-08-02 ENCOUNTER — Ambulatory Visit: Payer: Medicare Other

## 2020-08-23 ENCOUNTER — Telehealth: Payer: Self-pay | Admitting: Urology

## 2020-08-23 NOTE — Telephone Encounter (Signed)
I called pt. Pt reports he was able to self cath 1 hour ago without and difficulty.

## 2020-08-23 NOTE — Telephone Encounter (Signed)
Patient has cath bag issue. He LM asking for nurse to return his call.

## 2020-08-27 ENCOUNTER — Ambulatory Visit (INDEPENDENT_AMBULATORY_CARE_PROVIDER_SITE_OTHER): Payer: Medicare Other

## 2020-08-27 ENCOUNTER — Other Ambulatory Visit: Payer: Self-pay

## 2020-08-27 DIAGNOSIS — E7849 Other hyperlipidemia: Secondary | ICD-10-CM | POA: Diagnosis not present

## 2020-08-27 DIAGNOSIS — I2510 Atherosclerotic heart disease of native coronary artery without angina pectoris: Secondary | ICD-10-CM | POA: Diagnosis not present

## 2020-08-27 DIAGNOSIS — I10 Essential (primary) hypertension: Secondary | ICD-10-CM | POA: Diagnosis not present

## 2020-08-27 DIAGNOSIS — R339 Retention of urine, unspecified: Secondary | ICD-10-CM | POA: Diagnosis not present

## 2020-08-27 NOTE — Progress Notes (Signed)
Simple Catheter Placement  Due to urinary retention patient is present today for a foley cath placement.  Patient was cleaned and prepped in a sterile fashion with betadine. A 16 FR foley catheter was inserted, urine return was noted  35ml, urine was dark yellow in color.  The balloon was filled with 10cc of sterile water.  A leg bag was attached for drainage. Patient was also given a night bag to take home and was given instruction on how to change from one bag to another.  Patient was given instruction on proper catheter care.  Patient tolerated well, no complications were noted   Performed by: Robecca Fulgham,LPN  Additional notes/ Follow up: keep next OV

## 2020-08-27 NOTE — Patient Instructions (Signed)

## 2020-09-01 LAB — URINE CULTURE

## 2020-09-03 ENCOUNTER — Telehealth: Payer: Self-pay

## 2020-09-03 ENCOUNTER — Other Ambulatory Visit: Payer: Self-pay

## 2020-09-03 DIAGNOSIS — E7849 Other hyperlipidemia: Secondary | ICD-10-CM | POA: Diagnosis not present

## 2020-09-03 DIAGNOSIS — Z6834 Body mass index (BMI) 34.0-34.9, adult: Secondary | ICD-10-CM | POA: Diagnosis not present

## 2020-09-03 DIAGNOSIS — Z0001 Encounter for general adult medical examination with abnormal findings: Secondary | ICD-10-CM | POA: Diagnosis not present

## 2020-09-03 DIAGNOSIS — I2510 Atherosclerotic heart disease of native coronary artery without angina pectoris: Secondary | ICD-10-CM | POA: Diagnosis not present

## 2020-09-03 DIAGNOSIS — E782 Mixed hyperlipidemia: Secondary | ICD-10-CM | POA: Diagnosis not present

## 2020-09-03 DIAGNOSIS — E6609 Other obesity due to excess calories: Secondary | ICD-10-CM | POA: Diagnosis not present

## 2020-09-03 DIAGNOSIS — I10 Essential (primary) hypertension: Secondary | ICD-10-CM | POA: Diagnosis not present

## 2020-09-03 DIAGNOSIS — Z1389 Encounter for screening for other disorder: Secondary | ICD-10-CM | POA: Diagnosis not present

## 2020-09-03 MED ORDER — NITROFURANTOIN MONOHYD MACRO 100 MG PO CAPS: 100.0000 mg | ORAL_CAPSULE | Freq: Two times a day (BID) | ORAL | 0 refills | Status: DC

## 2020-09-03 NOTE — Telephone Encounter (Signed)
rx sent in. Pt called and made aware.

## 2020-09-03 NOTE — Telephone Encounter (Signed)
-----   Message from Cleon Gustin, MD sent at 09/03/2020  9:41 AM EDT ----- Macrobid 100mg  BID for 7 days ----- Message ----- From: Dorisann Frames, RN Sent: 09/03/2020   8:47 AM EDT To: Cleon Gustin, MD  Please review

## 2020-09-11 ENCOUNTER — Other Ambulatory Visit: Payer: Self-pay | Admitting: Cardiovascular Disease

## 2020-09-23 DIAGNOSIS — I8312 Varicose veins of left lower extremity with inflammation: Secondary | ICD-10-CM | POA: Diagnosis not present

## 2020-09-23 DIAGNOSIS — I890 Lymphedema, not elsewhere classified: Secondary | ICD-10-CM | POA: Diagnosis not present

## 2020-09-23 DIAGNOSIS — I10 Essential (primary) hypertension: Secondary | ICD-10-CM | POA: Diagnosis not present

## 2020-09-23 DIAGNOSIS — M7989 Other specified soft tissue disorders: Secondary | ICD-10-CM | POA: Diagnosis not present

## 2020-09-23 DIAGNOSIS — Z6834 Body mass index (BMI) 34.0-34.9, adult: Secondary | ICD-10-CM | POA: Diagnosis not present

## 2020-09-23 DIAGNOSIS — L03115 Cellulitis of right lower limb: Secondary | ICD-10-CM | POA: Diagnosis not present

## 2020-09-23 DIAGNOSIS — R7309 Other abnormal glucose: Secondary | ICD-10-CM | POA: Diagnosis not present

## 2020-09-24 ENCOUNTER — Ambulatory Visit: Payer: Medicare Other

## 2020-09-25 ENCOUNTER — Ambulatory Visit (INDEPENDENT_AMBULATORY_CARE_PROVIDER_SITE_OTHER): Payer: Medicare Other | Admitting: Urology

## 2020-09-25 ENCOUNTER — Other Ambulatory Visit: Payer: Self-pay

## 2020-09-25 ENCOUNTER — Encounter: Payer: Self-pay | Admitting: Urology

## 2020-09-25 VITALS — BP 124/74 | HR 77 | Temp 97.9°F | Ht 70.0 in | Wt 246.0 lb

## 2020-09-25 DIAGNOSIS — R339 Retention of urine, unspecified: Secondary | ICD-10-CM

## 2020-09-25 NOTE — Patient Instructions (Signed)
Acute Urinary Retention, Male ° °Acute urinary retention means that you cannot pee (urinate) at all, or that you pee too little and your bladder is not emptied completely. If it is not treated, it can lead to kidney damage or other serious problems. °Follow these instructions at home: °· Take over-the-counter and prescription medicines only as told by your doctor. Ask your doctor what medicines you should stay away from. Do not take any medicine unless your doctor says it is okay to do so. °· If you were sent home with a tube that drains the bladder (catheter), take care of it as told by your doctor. °· Drink enough fluid to keep your pee clear or pale yellow. °· If you were given an antibiotic, take it as told by your doctor. Do not stop taking the antibiotic even if you start to feel better. °· Do not use any products that contain nicotine or tobacco, such as cigarettes and e-cigarettes. If you need help quitting, ask your doctor. °· Watch for changes in your symptoms. Tell your doctor about them. °· If told, track changes in your blood pressure at home. Tell your doctor about them. °· Keep all follow-up visits as told by your doctor. This is important. °Contact a doctor if: °· You have spasms or you leak pee when you have spasms. °Get help right away if: °· You have chills or a fever. °· You have a tube that drains the bladder and: °? The tube stops draining pee. °? The tube falls out. °· You have blood in your pee. °Summary °· Acute urinary retention means that you have problems peeing. It may mean that you cannot pee at all, or that you pee too little. °· If this condition is not treated, it can lead to kidney damage or other serious problems. °· If you were sent home with a tube that drains the bladder, take care of it as told by your doctor. °· Monitor any changes in your symptoms. Tell your doctor about any changes. °This information is not intended to replace advice given to you by your health care  provider. Make sure you discuss any questions you have with your health care provider. °Document Revised: 03/02/2019 Document Reviewed: 01/15/2017 °Elsevier Patient Education © 2020 [employer] Inc. ° °

## 2020-09-25 NOTE — Progress Notes (Signed)
09/25/2020 11:59 AM   Caleb [name] Feb 16, 1925 [id number]  Referring provider: Lorretta Harp, [address] [place],  [place] [address]  Urinary retention  HPI: Mr Caleb [name] is a 86yo here for followup for urinary retention. No UTIs since last visit. He performs CIC 3-4x per day. No hematuria. No pain with catheterizing. Patient has perineal urethrostomy.    PMH: Past Medical History:  Diagnosis Date  . Coronary artery disease   . Hyperlipidemia   . Hyperlipidemia   . Obesity   . Penile cancer ([place])   . Swelling of extremity    Left Leg    Surgical History: Past Surgical History:  Procedure Laterality Date  . APPENDECTOMY    . BACK SURGERY    . CHOLECYSTECTOMY    . CORONARY ANGIOPLASTY  11/2005   RCA PCI AND STENTING WITH A CYPHER DES  . CORONARY ARTERY BYPASS GRAFT    . HIATAL HERNIA REPAIR    . lymph node removal    . NM MYOCAR MULTIPLE W/SPECT  11/26/2009   EF 62%. NORMAL MYOCARDIAL PERFUSION STUDY.  . TRANSTHORACIC ECHOCARDIOGRAM  12/22/2005   MILD TO MOD AORTIC SCLEROSIS W/O STENOSIS. MILD TO MODERATE MITRAL CALCIFICATION. LA- MILDLY DILATED.    Home Medications:  Allergies as of 09/25/2020   No Known Allergies     Medication List       Accurate as of September 25, 2020 11:59 AM. If you have any questions, ask your nurse or doctor.        acetaminophen 325 MG tablet Commonly known as: TYLENOL Take 1-2 tablets (325-650 mg total) by mouth every 6 (six) hours as needed for fever, headache, mild pain or moderate pain.   aspirin EC 81 MG tablet Take 81 mg by mouth daily.   CALCIUM-D PO Take 1 tablet by mouth daily.   cefpodoxime 200 MG tablet Commonly known as: VANTIN Take 1 tablet (200 mg total) by mouth 2 (two) times daily.   clopidogrel 75 MG tablet Commonly known as: PLAVIX TAKE 1 TABLET EVERY DAY   docusate sodium 100 MG capsule Commonly known as: COLACE Take 1 capsule (100 mg total) by mouth every 12 (twelve) hours.    magnesium hydroxide 400 MG/5ML suspension Commonly known as: MILK OF MAGNESIA Take 30 mLs by mouth daily as needed for mild constipation.   metoprolol tartrate 25 MG tablet Commonly known as: LOPRESSOR Take 1 tablet (25 mg total) by mouth 2 (two) times daily.   multivitamin with minerals Tabs tablet Take 1 tablet by mouth daily. Centrum Silver   nitrofurantoin (macrocrystal-monohydrate) 100 MG capsule Commonly known as: MACROBID Take 1 capsule (100 mg total) by mouth every 12 (twelve) hours.   nystatin powder Commonly known as: MYCOSTATIN/NYSTOP Apply topically. What changed: Another medication with the same name was removed. Continue taking this medication, and follow the directions you see here. Changed by: Nicolette Bang, MD   polyethylene glycol powder 17 GM/SCOOP powder Commonly known as: GLYCOLAX/MIRALAX Take 17 g by mouth daily as needed for mild constipation or moderate constipation.   PreserVision AREDS 2 Caps Take by mouth.   simvastatin 20 MG tablet Commonly known as: ZOCOR TAKE 1 TABLET AT BEDTIME   vitamin C 1000 MG tablet Take 1,000 mg by mouth daily.       Allergies: No Known Allergies  Family History: Family History  Problem Relation Age of Onset  . Cancer Father   . Cancer Sister   . Cancer Brother   .  Cancer Sister   . Cancer Other     Social History:  reports that he quit smoking about 37 years ago. His smoking use included cigarettes. He has never used smokeless tobacco. He reports that he does not drink alcohol and does not use drugs.  ROS: All other review of systems were reviewed and are negative except what is noted above in HPI  Physical Exam: BP 124/74   Pulse 77   Temp 97.9 F (36.6 C)   Ht 5\' 10"  (1.778 m)   Wt 246 lb (111.6 kg)   BMI 35.30 kg/m   Constitutional:  Alert and oriented, No acute distress. HEENT: [place] AT, moist mucus membranes.  Trachea midline, no masses. Cardiovascular: No clubbing, cyanosis, or  edema. Respiratory: Normal respiratory effort, no increased work of breathing. GI: Abdomen is soft, nontender, nondistended, no abdominal masses GU: No CVA tenderness.  Lymph: No cervical or inguinal lymphadenopathy. Skin: No rashes, bruises or suspicious lesions. Neurologic: Grossly intact, no focal deficits, moving all 4 extremities. Psychiatric: Normal mood and affect.  Laboratory Data: Lab Results  Component Value Date   WBC 5.7 03/17/2016   HGB 12.6 (L) 01/07/2018   HCT 37.0 (L) 01/07/2018   MCV 93.0 03/17/2016   PLT 201 03/17/2016    Lab Results  Component Value Date   CREATININE 1.20 01/07/2018    No results found for: PSA  No results found for: TESTOSTERONE  No results found for: HGBA1C  Urinalysis    Component Value Date/Time   COLORURINE YELLOW 01/09/2018 0830   APPEARANCEUR CLOUDY (A) 01/09/2018 0830   LABSPEC 1.014 01/09/2018 0830   PHURINE 6.0 01/09/2018 0830   GLUCOSEU NEGATIVE 01/09/2018 0830   HGBUR SMALL (A) 01/09/2018 0830   BILIRUBINUR negative 03/25/2020 1156   KETONESUR negative 03/25/2020 1156   KETONESUR 20 (A) 01/09/2018 0830   PROTEINUR =100 (A) 03/25/2020 1156   PROTEINUR 100 (A) 01/09/2018 0830   UROBILINOGEN 0.2 03/25/2020 1156   NITRITE Negative 03/25/2020 1156   NITRITE NEGATIVE 01/09/2018 0830   LEUKOCYTESUR Small (1+) (A) 03/25/2020 1156    Lab Results  Component Value Date   BACTERIA MANY (A) 01/09/2018    Pertinent Imaging:  No results found for this or any previous visit.  Results for orders placed during the hospital encounter of 05/13/05  US VenoUS Imaging Bilateral  Narrative Clinical data: Bilateral lower extremity edema and pain  Bilateral lower extremity venous Doppler ultrasound:  The lower extremity deep venous system demonstrates normal compressibility, phasicity, and augmentation.   No evidence of DVT.  Impression: 1. Negative for   lower extremity DVT  Provider: Mertie Clause  No results found  for this or any previous visit.  No results found for this or any previous visit.  No results found for this or any previous visit.  No results found for this or any previous visit.  No results found for this or any previous visit.  No results found for this or any previous visit.   Assessment & Plan:    1. Urinary retention -Continue CIC -RTC 6 months   No follow-ups on file.  Nicolette Bang, MD  [facility] [place]

## 2020-09-25 NOTE — Progress Notes (Signed)
Urological Symptom Review  Patient is experiencing the following symptoms: none   Review of Systems  Gastrointestinal (upper)  : Negative for upper GI symptoms  Gastrointestinal (lower) : Negative for lower GI symptoms  Constitutional : Negative for symptoms  Skin: Negative for skin symptoms  Eyes: Negative for eye symptoms  Ear/Nose/Throat : Negative for Ear/Nose/Throat symptoms  Hematologic/Lymphatic: Negative for Hematologic/Lymphatic symptoms  Cardiovascular : Negative for cardiovascular symptoms  Respiratory : Negative for respiratory symptoms  Endocrine: Negative for endocrine symptoms  Musculoskeletal: Negative for musculoskeletal symptoms  Neurological: Negative for neurological symptoms  Psychologic: Negative for psychiatric symptoms   Cath Change/ Replacement  Patient is present today for a catheter change due to urinary retention.  61ml of water was removed from the balloon, a 16FR foley cath was removed without difficulty.  Patient was cleaned and prepped in a sterile fashion with betadine. A 16 FR foley cath was replaced into the bladder no complications were noted Urine return was noted 40ml and urine was yellow in color. The balloon was filled with 54ml of sterile water. A leg bag was attached for drainage.  A night bag was also given to the patient and patient was given instruction on how to change from one bag to another. Patient was given proper instruction on catheter care.    Performed by: Atom Solivan, LPN  Follow up: 1 month cath change

## 2020-09-27 ENCOUNTER — Other Ambulatory Visit: Payer: Self-pay

## 2020-09-27 MED ORDER — METOPROLOL TARTRATE 25 MG PO TABS: 25.0000 mg | ORAL_TABLET | Freq: Two times a day (BID) | ORAL | 1 refills | Status: DC

## 2020-09-30 ENCOUNTER — Encounter: Payer: Self-pay | Admitting: Podiatry

## 2020-09-30 ENCOUNTER — Other Ambulatory Visit: Payer: Self-pay

## 2020-09-30 ENCOUNTER — Ambulatory Visit (INDEPENDENT_AMBULATORY_CARE_PROVIDER_SITE_OTHER): Payer: Medicare Other | Admitting: Podiatry

## 2020-09-30 DIAGNOSIS — M79675 Pain in left toe(s): Secondary | ICD-10-CM | POA: Diagnosis not present

## 2020-09-30 DIAGNOSIS — L601 Onycholysis: Secondary | ICD-10-CM

## 2020-09-30 DIAGNOSIS — M79674 Pain in right toe(s): Secondary | ICD-10-CM

## 2020-09-30 DIAGNOSIS — B351 Tinea unguium: Secondary | ICD-10-CM

## 2020-09-30 DIAGNOSIS — I890 Lymphedema, not elsewhere classified: Secondary | ICD-10-CM

## 2020-10-04 DIAGNOSIS — H353231 Exudative age-related macular degeneration, bilateral, with active choroidal neovascularization: Secondary | ICD-10-CM | POA: Diagnosis not present

## 2020-10-04 NOTE — Progress Notes (Signed)
Subjective:  Patient ID: Caleb [name], male    DOB: 01-Jan-1925,  MRN: [id number]  86 y.o. male presents with for at risk foot care. Patient has h/o PAD and painful thick toenails that are difficult to trim. Pain interferes with ambulation. Aggravating factors include wearing enclosed shoe gear. Pain is relieved with periodic professional debridement.Marland Kitchen    He is followed by vascular for lymphedema. Not able to donn compression hose due to weakened hand strength.  Review of Systems: Negative except as noted in the HPI.  Past Medical History:  Diagnosis Date  . Coronary artery disease   . Hyperlipidemia   . Hyperlipidemia   . Obesity   . Penile cancer ([place])   . Swelling of extremity    Left Leg   Past Surgical History:  Procedure Laterality Date  . APPENDECTOMY    . BACK SURGERY    . CHOLECYSTECTOMY    . CORONARY ANGIOPLASTY  11/2005   RCA PCI AND STENTING WITH A CYPHER DES  . CORONARY ARTERY BYPASS GRAFT    . HIATAL HERNIA REPAIR    . lymph node removal    . NM MYOCAR MULTIPLE W/SPECT  11/26/2009   EF 62%. NORMAL MYOCARDIAL PERFUSION STUDY.  . TRANSTHORACIC ECHOCARDIOGRAM  12/22/2005   MILD TO MOD AORTIC SCLEROSIS W/O STENOSIS. MILD TO MODERATE MITRAL CALCIFICATION. LA- MILDLY DILATED.   Patient Active Problem List   Diagnosis Date Noted  . Urinary retention 03/27/2020  . Exudative age-related macular degeneration, right eye, with active choroidal neovascularization ([place]) 08/23/2017  . BMI 32.0-32.9,adult 02/26/2016  . Neurogenic bladder 02/26/2016  . Scalp hematoma 01/13/2016  . Self-catheterizes urinary bladder 01/13/2016  . Pneumothorax, left 01/12/2016  . Atypical chest pain 12/02/2015  . Muscular deconditioning 02/20/2015  . Penile cancer ([place]) 01/09/2015  . Cellulitis 05/17/2014  . Pain in limb- Slight pain- Left Leg 05/03/2014  . Discoloration of skin-Left Leg 05/03/2014  . Stasis edema with ulcer ([place]) 08/17/2013  . Peripheral vascular disease, unspecified  ([place]) 07/13/2013  . Swelling of limb 07/13/2013  . Coronary artery disease 06/07/2013  . Left upper extremity numbness 06/07/2013  . Hyperlipidemia   . Knee pain 08/11/2011    Current Outpatient Medications:  .  acetaminophen (TYLENOL) 325 MG tablet, Take 1-2 tablets (325-650 mg total) by mouth every 6 (six) hours as needed for fever, headache, mild pain or moderate pain., Disp: , Rfl:  .  Ascorbic Acid (VITAMIN C) 1000 MG tablet, Take 1,000 mg by mouth daily. , Disp: , Rfl:  .  aspirin EC 81 MG tablet, Take 81 mg by mouth daily., Disp: , Rfl:  .  Calcium Carbonate-Vitamin D (CALCIUM-D PO), Take 1 tablet by mouth daily., Disp: , Rfl:  .  cefpodoxime (VANTIN) 200 MG tablet, Take 1 tablet (200 mg total) by mouth 2 (two) times daily., Disp: 14 tablet, Rfl: 0 .  clopidogrel (PLAVIX) 75 MG tablet, TAKE 1 TABLET EVERY DAY, Disp: 90 tablet, Rfl: 2 .  docusate sodium (COLACE) 100 MG capsule, Take 1 capsule (100 mg total) by mouth every 12 (twelve) hours., Disp: 20 capsule, Rfl: 0 .  magnesium hydroxide (MILK OF MAGNESIA) 400 MG/5ML suspension, Take 30 mLs by mouth daily as needed for mild constipation., Disp: , Rfl:  .  metoprolol tartrate (LOPRESSOR) 25 MG tablet, Take 1 tablet (25 mg total) by mouth 2 (two) times daily., Disp: 180 tablet, Rfl: 1 .  Multiple Vitamin (MULTIVITAMIN WITH MINERALS) TABS tablet, Take 1 tablet by mouth daily. Centrum Silver, Disp: ,  Rfl:  .  Multiple Vitamins-Minerals (PRESERVISION AREDS 2) CAPS, Take by mouth., Disp: , Rfl:  .  nitrofurantoin, macrocrystal-monohydrate, (MACROBID) 100 MG capsule, Take 1 capsule (100 mg total) by mouth every 12 (twelve) hours., Disp: 14 capsule, Rfl: 0 .  nystatin (MYCOSTATIN/NYSTOP) powder, Apply topically., Disp: , Rfl:  .  polyethylene glycol powder (GLYCOLAX/MIRALAX) powder, Take 17 g by mouth daily as needed for mild constipation or moderate constipation., Disp: , Rfl:  .  simvastatin (ZOCOR) 20 MG tablet, TAKE 1 TABLET AT BEDTIME,  Disp: 60 tablet, Rfl: 1 No Known Allergies Social History   Occupational History  . Occupation: retired    Fish farm manager: RETIRED  Tobacco Use  . Smoking status: Former Smoker    Types: Cigarettes    Quit date: 07/14/1983    Years since quitting: 37.2  . Smokeless tobacco: Never Used  Substance and Sexual Activity  . Alcohol use: No  . Drug use: No  . Sexual activity: Not on file    Objective:   Constitutional Pt is a pleasant 86 y.o. Caucasian male in NAD.Marland Kitchen AAO x 3.   Vascular Capillary fill time to digits <3 seconds b/l lower extremities. Palpable pedal pulses b/l LE. Pedal hair absent. Lower extremity skin temperature gradient within normal limits. No pain with calf compression b/l. Lymphedema present b/l lower extremities. No ischemia or gangrene noted b/l lower extremities. No cyanosis or clubbing noted.  Neurologic Normal speech. Oriented to person, place, and time. Protective sensation intact 5/5 intact bilaterally with 10g monofilament b/l. Vibratory sensation intact b/l. Proprioception intact bilaterally.  Dermatologic Pedal skin with normal turgor, texture and tone bilaterally. No open wounds bilaterally. No interdigital macerations bilaterally. Toenails 1-5 b/l elongated, discolored, dystrophic, thickened, crumbly with subungual debris and tenderness to dorsal palpation. There is noted onchyolysis of entire nailplate of the right hallux nail.  The nailbed remains intact. There is no erythema, no edema, no drainage, no underlying fluctuance.  Orthopedic: Normal muscle strength 5/5 to all lower extremity muscle groups bilaterally. No pain crepitus or joint limitation noted with ROM b/l. Hallux valgus with bunion deformity noted b/l lower extremities. Hammertoes noted to the 2-5 bilaterally. Utilizes [employer] for ambulation assistance.   Radiographs: None Assessment:   1. Pain due to onychomycosis of toenails of both feet   2. Onycholysis of toenail   3. Lymphedema    Plan:   Patient was evaluated and treated and all questions answered.  Onychomycosis with pain -Nails palliatively debridement as below. -Educated on self-care  Procedure: Nail Debridement Rationale: Pain Type of Debridement: manual, sharp debridement. Instrumentation: Nail nipper, rotary burr. Number of Nails: 10  -Examined patient. -Toenails 1-5 b/l were debrided in length and girth with sterile nail nippers and dremel without iatrogenic bleeding.  -Right great toe nailplate gently debrided from it's remaining attachment to digit. Nailbed cleansed with alcohol. Triple antibiotic ointment applied. No further treatment required. -Patient to report any pedal injuries to medical professional immediately. -Patient to continue soft, supportive shoe gear daily. -Patient/POA to call should there be question/concern in the interim.  Return in about 3 months (around 12/31/2020).  Marzetta Board, DPM

## 2020-10-16 DIAGNOSIS — Z23 Encounter for immunization: Secondary | ICD-10-CM | POA: Diagnosis not present

## 2020-10-22 ENCOUNTER — Ambulatory Visit (INDEPENDENT_AMBULATORY_CARE_PROVIDER_SITE_OTHER): Payer: Medicare Other

## 2020-10-22 ENCOUNTER — Ambulatory Visit: Payer: Medicare Other

## 2020-10-22 ENCOUNTER — Other Ambulatory Visit: Payer: Self-pay

## 2020-10-22 DIAGNOSIS — R339 Retention of urine, unspecified: Secondary | ICD-10-CM | POA: Diagnosis not present

## 2020-10-22 NOTE — Progress Notes (Signed)
Cath Change/ Replacement  Patient is present today for a catheter change due to urinary retention.  47ml of water was removed from the balloon, a 16FR foley cath was removed without difficulty.  Patient was cleaned and prepped in a sterile fashion with betadine. A 16 FR foley cath was replaced into the bladder no complications were noted Urine return was noted 2ml and urine was yellow in color. The balloon was filled with 59ml of sterile water. A leg bag was attached for drainage.  A night bag was also given to the patient and patient was given instruction on how to change from one bag to another. Patient was given proper instruction on catheter care.    Performed by: Juddson Cobern, LPN  Follow up: Keep next scheduled NV

## 2020-10-26 DIAGNOSIS — E7849 Other hyperlipidemia: Secondary | ICD-10-CM | POA: Diagnosis not present

## 2020-10-26 DIAGNOSIS — I10 Essential (primary) hypertension: Secondary | ICD-10-CM | POA: Diagnosis not present

## 2020-11-01 ENCOUNTER — Ambulatory Visit (HOSPITAL_COMMUNITY)
Admission: RE | Admit: 2020-11-01 | Discharge: 2020-11-01 | Disposition: A | Discharge status: Home / Self Care | Payer: Medicare Other | Source: Ambulatory Visit | Attending: Family Medicine | Admitting: Family Medicine

## 2020-11-01 ENCOUNTER — Other Ambulatory Visit: Payer: Self-pay

## 2020-11-01 ENCOUNTER — Other Ambulatory Visit (HOSPITAL_COMMUNITY): Payer: Self-pay | Admitting: Family Medicine

## 2020-11-01 DIAGNOSIS — M7989 Other specified soft tissue disorders: Secondary | ICD-10-CM

## 2020-11-01 DIAGNOSIS — R600 Localized edema: Secondary | ICD-10-CM | POA: Diagnosis not present

## 2020-11-01 DIAGNOSIS — E6609 Other obesity due to excess calories: Secondary | ICD-10-CM | POA: Diagnosis not present

## 2020-11-01 DIAGNOSIS — Z6833 Body mass index (BMI) 33.0-33.9, adult: Secondary | ICD-10-CM | POA: Diagnosis not present

## 2020-11-04 ENCOUNTER — Other Ambulatory Visit: Payer: Self-pay | Admitting: Family Medicine

## 2020-11-04 DIAGNOSIS — M7989 Other specified soft tissue disorders: Secondary | ICD-10-CM

## 2020-11-15 DIAGNOSIS — Z79899 Other long term (current) drug therapy: Secondary | ICD-10-CM | POA: Diagnosis not present

## 2020-11-15 DIAGNOSIS — H353222 Exudative age-related macular degeneration, left eye, with inactive choroidal neovascularization: Secondary | ICD-10-CM | POA: Diagnosis not present

## 2020-11-15 DIAGNOSIS — H353211 Exudative age-related macular degeneration, right eye, with active choroidal neovascularization: Secondary | ICD-10-CM | POA: Diagnosis not present

## 2020-11-18 ENCOUNTER — Telehealth: Payer: Self-pay

## 2020-11-18 ENCOUNTER — Ambulatory Visit: Payer: Medicare Other

## 2020-11-18 NOTE — Telephone Encounter (Signed)
Pt returned call and  NV rescheduled

## 2020-11-20 ENCOUNTER — Ambulatory Visit (INDEPENDENT_AMBULATORY_CARE_PROVIDER_SITE_OTHER): Payer: Medicare Other

## 2020-11-20 ENCOUNTER — Other Ambulatory Visit: Payer: Self-pay

## 2020-11-20 DIAGNOSIS — R339 Retention of urine, unspecified: Secondary | ICD-10-CM

## 2020-11-20 NOTE — Progress Notes (Signed)
Cath Change/ Replacement  Patient is present today for a catheter change due to urinary retention.  28ml of water was removed from the balloon, a 16FR foley cath was removed with out difficulty.  Patient was cleaned and prepped in a sterile fashion with betadine. A 16 FR foley cath was replaced into the bladder no complications were noted Urine return was noted 21ml and urine was yellow in color. The balloon was filled with 45ml of sterile water. A leg bag was attached for drainage. Patient was given proper instruction on catheter care.    Performed by: Antionette Char, Adonijah Baena,LPN  Follow up:  1 month

## 2020-12-12 DIAGNOSIS — Z23 Encounter for immunization: Secondary | ICD-10-CM | POA: Diagnosis not present

## 2020-12-18 ENCOUNTER — Ambulatory Visit (INDEPENDENT_AMBULATORY_CARE_PROVIDER_SITE_OTHER): Payer: Medicare Other

## 2020-12-18 ENCOUNTER — Other Ambulatory Visit: Payer: Self-pay

## 2020-12-18 DIAGNOSIS — R339 Retention of urine, unspecified: Secondary | ICD-10-CM | POA: Diagnosis not present

## 2020-12-18 NOTE — Progress Notes (Signed)
Cath Change/ Replacement  Patient is present today for a catheter change due to urinary retention.  67ml of water was removed from the balloon, a 16FR foley cath was removed with out difficulty.  Patient was cleaned and prepped in a sterile fashion with betadine. A 16 FR foley cath was replaced into the bladder no complications were noted Urine return was noted 14ml and urine was yellow in color. The balloon was filled with 16ml of sterile water. A leg bag was attached for drainage.   Patient was given proper instruction on catheter care.    Performed by: Valentina Lucks, LPN  Follow up:  1 month

## 2020-12-27 DIAGNOSIS — I10 Essential (primary) hypertension: Secondary | ICD-10-CM | POA: Diagnosis not present

## 2020-12-27 DIAGNOSIS — E7849 Other hyperlipidemia: Secondary | ICD-10-CM | POA: Diagnosis not present

## 2021-01-01 ENCOUNTER — Ambulatory Visit (INDEPENDENT_AMBULATORY_CARE_PROVIDER_SITE_OTHER): Payer: Medicare Other | Admitting: Podiatry

## 2021-01-01 ENCOUNTER — Encounter: Payer: Self-pay | Admitting: Podiatry

## 2021-01-01 ENCOUNTER — Other Ambulatory Visit: Payer: Self-pay

## 2021-01-01 DIAGNOSIS — I739 Peripheral vascular disease, unspecified: Secondary | ICD-10-CM | POA: Diagnosis not present

## 2021-01-01 DIAGNOSIS — M79674 Pain in right toe(s): Secondary | ICD-10-CM

## 2021-01-01 DIAGNOSIS — B351 Tinea unguium: Secondary | ICD-10-CM

## 2021-01-01 DIAGNOSIS — L84 Corns and callosities: Secondary | ICD-10-CM | POA: Diagnosis not present

## 2021-01-01 DIAGNOSIS — M79675 Pain in left toe(s): Secondary | ICD-10-CM | POA: Diagnosis not present

## 2021-01-01 NOTE — Progress Notes (Signed)
Subjective:  Patient ID: Caleb [name], male    DOB: 05/11/1925,  MRN: [id number]  87 y.o. male presents with for at risk foot care. Patient has h/o PAD and painful thick toenails that are difficult to trim. Pain interferes with ambulation. Aggravating factors include wearing enclosed shoe gear. Pain is relieved with periodic professional debridement.Marland Kitchen    He is followed by vascular for lymphedema. Not able to donn compression hose due to weakened hand strength.  He relates tender calluses on plantar aspect of both feet today.  Review of Systems: Negative except as noted in the HPI.   PCP is Dr. Assunta Found and last visit was 09/03/2020. Past Medical History:  Diagnosis Date  . Coronary artery disease   . Hyperlipidemia   . Hyperlipidemia   . Obesity   . Penile cancer ([facility])   . Swelling of extremity    Left Leg   Past Surgical History:  Procedure Laterality Date  . APPENDECTOMY    . BACK SURGERY    . CHOLECYSTECTOMY    . CORONARY ANGIOPLASTY  11/2005   RCA PCI AND STENTING WITH A CYPHER DES  . CORONARY ARTERY BYPASS GRAFT    . HIATAL HERNIA REPAIR    . lymph node removal    . NM MYOCAR MULTIPLE W/SPECT  11/26/2009   EF 62%. NORMAL MYOCARDIAL PERFUSION STUDY.  . TRANSTHORACIC ECHOCARDIOGRAM  12/22/2005   MILD TO MOD AORTIC SCLEROSIS W/O STENOSIS. MILD TO MODERATE MITRAL CALCIFICATION. LA- MILDLY DILATED.   Patient Active Problem List   Diagnosis Date Noted  . Urinary retention 03/27/2020  . Exudative age-related macular degeneration, right eye, with active choroidal neovascularization ([facility]) 08/23/2017  . BMI 32.0-32.9,adult 02/26/2016  . Neurogenic bladder 02/26/2016  . Scalp hematoma 01/13/2016  . Self-catheterizes urinary bladder 01/13/2016  . Pneumothorax, left 01/12/2016  . Atypical chest pain 12/02/2015  . Muscular deconditioning 02/20/2015  . Penile cancer ([facility]) 01/09/2015  . Cellulitis 05/17/2014  . Pain in limb- Slight pain- Left Leg 05/03/2014  .  Discoloration of skin-Left Leg 05/03/2014  . Stasis edema with ulcer ([facility]) 08/17/2013  . Peripheral vascular disease, unspecified ([facility]) 07/13/2013  . Swelling of limb 07/13/2013  . Coronary artery disease 06/07/2013  . Left upper extremity numbness 06/07/2013  . Hyperlipidemia   . Knee pain 08/11/2011    Current Outpatient Medications:  .  acetaminophen (TYLENOL) 325 MG tablet, Take 1-2 tablets (325-650 mg total) by mouth every 6 (six) hours as needed for fever, headache, mild pain or moderate pain., Disp: , Rfl:  .  Ascorbic Acid (VITAMIN C) 1000 MG tablet, Take 1,000 mg by mouth daily. , Disp: , Rfl:  .  aspirin EC 81 MG tablet, Take 81 mg by mouth daily., Disp: , Rfl:  .  Calcium Carbonate-Vitamin D (CALCIUM-D PO), Take 1 tablet by mouth daily., Disp: , Rfl:  .  cefpodoxime (VANTIN) 200 MG tablet, Take 1 tablet (200 mg total) by mouth 2 (two) times daily., Disp: 14 tablet, Rfl: 0 .  clopidogrel (PLAVIX) 75 MG tablet, TAKE 1 TABLET EVERY DAY, Disp: 90 tablet, Rfl: 2 .  docusate sodium (COLACE) 100 MG capsule, Take 1 capsule (100 mg total) by mouth every 12 (twelve) hours., Disp: 20 capsule, Rfl: 0 .  doxycycline (VIBRAMYCIN) 100 MG capsule, , Disp: , Rfl:  .  furosemide (LASIX) 20 MG tablet, , Disp: , Rfl:  .  magnesium hydroxide (MILK OF MAGNESIA) 400 MG/5ML suspension, Take 30 mLs by mouth daily as needed for mild constipation.,  Disp: , Rfl:  .  metoprolol tartrate (LOPRESSOR) 25 MG tablet, Take 1 tablet (25 mg total) by mouth 2 (two) times daily., Disp: 180 tablet, Rfl: 1 .  Multiple Vitamin (MULTIVITAMIN WITH MINERALS) TABS tablet, Take 1 tablet by mouth daily. Centrum Silver, Disp: , Rfl:  .  Multiple Vitamins-Minerals (PRESERVISION AREDS 2) CAPS, Take by mouth., Disp: , Rfl:  .  nitrofurantoin, macrocrystal-monohydrate, (MACROBID) 100 MG capsule, Take 1 capsule (100 mg total) by mouth every 12 (twelve) hours., Disp: 14 capsule, Rfl: 0 .  nystatin (MYCOSTATIN/NYSTOP) powder, Apply  topically., Disp: , Rfl:  .  polyethylene glycol powder (GLYCOLAX/MIRALAX) powder, Take 17 g by mouth daily as needed for mild constipation or moderate constipation., Disp: , Rfl:  .  simvastatin (ZOCOR) 20 MG tablet, TAKE 1 TABLET AT BEDTIME, Disp: 60 tablet, Rfl: 1 No Known Allergies Social History   Occupational History  . Occupation: retired    Fish farm manager: RETIRED  Tobacco Use  . Smoking status: Former Smoker    Types: Cigarettes    Quit date: 07/14/1983    Years since quitting: 37.4  . Smokeless tobacco: Never Used  Substance and Sexual Activity  . Alcohol use: No  . Drug use: No  . Sexual activity: Not on file    Objective:   Constitutional Pt is a pleasant 87 y.o. Caucasian male in NAD.Marland Kitchen AAO x 3.   Vascular Capillary fill time to digits <3 seconds b/l lower extremities. Palpable pedal pulses b/l LE. Pedal hair absent. Lower extremity skin temperature gradient warm to cool. Dependent rubor noted b/l lower extremities. No pain with calf compression b/l. Lymphedema present b/l lower extremities. No ischemia or gangrene noted b/l lower extremities. No cyanosis or clubbing noted.  Neurologic Normal speech. Oriented to person, place, and time. Protective sensation intact 5/5 intact bilaterally with 10g monofilament b/l. Vibratory sensation intact b/l. Proprioception intact bilaterally.  Dermatologic Pedal skin is thin shiny, atrophic b/l lower extremities. No open wounds bilaterally. No interdigital macerations bilaterally. Toenails 1-5 b/l elongated, discolored, dystrophic, thickened, crumbly with subungual debris and tenderness to dorsal palpation. Hyperkeratotic lesion(s) submet head 5 left foot and submet head 5 right foot.  No erythema, no edema, no drainage, no fluctuance.  Orthopedic: Normal muscle strength 5/5 to all lower extremity muscle groups bilaterally. No pain crepitus or joint limitation noted with ROM b/l. Hallux valgus with bunion deformity noted b/l lower extremities.  Hammertoes noted to the 2-5 bilaterally. Utilizes rollator for ambulation assistance.   Radiographs: None Assessment:   1. Pain due to onychomycosis of toenails of both feet   2. Callus   3. Peripheral vascular disease with claudication ([place])    Plan:  Patient was evaluated and treated and all questions answered.  Onychomycosis with pain -Nails palliatively debridement as below. -Educated on self-care  Procedure: Nail Debridement Rationale: Pain Type of Debridement: manual, sharp debridement. Instrumentation: Nail nipper, rotary burr. Number of Nails: 10  -Examined patient. -No new findings. No new orders. -Toenails 1-5 b/l were debrided in length and girth with sterile nail nippers and dremel without iatrogenic bleeding.  -Calluses pared submetatarsal head(s) 5 b/l utilizing sterile scalpel blade without incident. -Patient to report any pedal injuries to medical professional immediately. -Patient to continue soft, supportive shoe gear daily. -Patient/POA to call should there be question/concern in the interim.  Return in about 3 months (around 04/01/2021).  Marzetta Board, DPM

## 2021-01-17 ENCOUNTER — Ambulatory Visit (INDEPENDENT_AMBULATORY_CARE_PROVIDER_SITE_OTHER): Payer: Medicare Other

## 2021-01-17 ENCOUNTER — Other Ambulatory Visit: Payer: Self-pay

## 2021-01-17 DIAGNOSIS — R339 Retention of urine, unspecified: Secondary | ICD-10-CM

## 2021-01-17 NOTE — Progress Notes (Signed)
Cath Change/ Replacement  Patient is present today for a catheter change due to urinary retention.  82ml of water was removed from the balloon, a 16FR foley cath was removed with out difficulty.  Patient was cleaned and prepped in a sterile fashion with betadine. A 16 FR foley cath was replaced into the bladder no complications were noted Urine return was noted 63ml and urine was yellow in color. The balloon was filled with 52ml of sterile water. A leg bag was attached for drainage.  A night bag was also given to the patient and patient was given instruction on how to change from one bag to another. Patient was given proper instruction on catheter care.    Performed by: Park Pope Rn  Follow up: 1 month cath change

## 2021-01-20 ENCOUNTER — Ambulatory Visit: Payer: Medicare Other

## 2021-02-10 DIAGNOSIS — H353231 Exudative age-related macular degeneration, bilateral, with active choroidal neovascularization: Secondary | ICD-10-CM | POA: Diagnosis not present

## 2021-02-19 ENCOUNTER — Ambulatory Visit (INDEPENDENT_AMBULATORY_CARE_PROVIDER_SITE_OTHER): Payer: Medicare Other

## 2021-02-19 DIAGNOSIS — R339 Retention of urine, unspecified: Secondary | ICD-10-CM

## 2021-02-19 NOTE — Progress Notes (Signed)
Catheter Removal  Patient is present today for a catheter removal.  75ml of water was drained from the balloon. A 16FR foley cath was removed from the bladder no complications were noted . Patient tolerated well.  Performed by: Jorge Ny

## 2021-02-20 ENCOUNTER — Ambulatory Visit (INDEPENDENT_AMBULATORY_CARE_PROVIDER_SITE_OTHER): Payer: Medicare Other

## 2021-02-20 DIAGNOSIS — R339 Retention of urine, unspecified: Secondary | ICD-10-CM | POA: Diagnosis not present

## 2021-02-20 NOTE — Patient Instructions (Signed)

## 2021-02-20 NOTE — Progress Notes (Signed)
Simple Catheter Placement  Due to urinary retention patient is present today for a foley cath placement.  Patient was cleaned and prepped in a sterile fashion with betadine. A 16 FR foley catheter was inserted, urine return was noted  620ml, urine was yellow in color.  The balloon was filled with 10cc of sterile water.  A leg bag was attached for drainage. Patient was also given a night bag to take home and was given instruction on how to change from one bag to another.  Patient was given instruction on proper catheter care.  Patient tolerated well, no complications were noted   Performed by: Hope, lpn  Additional notes/ Follow up: Keep next scheduled OV

## 2021-03-04 ENCOUNTER — Other Ambulatory Visit: Payer: Self-pay | Admitting: Cardiovascular Disease

## 2021-03-11 ENCOUNTER — Other Ambulatory Visit: Payer: Self-pay | Admitting: Cardiovascular Disease

## 2021-03-26 ENCOUNTER — Ambulatory Visit (INDEPENDENT_AMBULATORY_CARE_PROVIDER_SITE_OTHER): Payer: Medicare Other | Admitting: Urology

## 2021-03-26 ENCOUNTER — Other Ambulatory Visit: Payer: Self-pay

## 2021-03-26 ENCOUNTER — Encounter: Payer: Self-pay | Admitting: Urology

## 2021-03-26 VITALS — BP 144/75 | HR 84 | Temp 98.4°F | Resp 18 | Ht 70.0 in | Wt 255.0 lb

## 2021-03-26 DIAGNOSIS — R339 Retention of urine, unspecified: Secondary | ICD-10-CM

## 2021-03-26 NOTE — Progress Notes (Signed)
Urological Symptom Review  Patient is experiencing the following symptoms: Trouble starting stream   Review of Systems  Gastrointestinal (upper)  : Negative for upper GI symptoms  Gastrointestinal (lower) : Negative for lower GI symptoms  Constitutional : Negative for symptoms  Skin: Negative for skin symptoms  Eyes: Negative for eye symptoms  Ear/Nose/Throat : Negative for Ear/Nose/Throat symptoms  Hematologic/Lymphatic: Negative  Cardiovascular : Negative for cardiovascular symptoms  Respiratory : Negative for respiratory symptoms  Endocrine: Negative for endocrine symptoms  Musculoskeletal: Negative for musculoskeletal symptoms  Neurological: Negative for neurological symptoms  Psychologic: Negative for psychiatric symptoms

## 2021-03-26 NOTE — Patient Instructions (Addendum)
Acute Urinary Retention, Male  Acute urinary retention is a condition in which a person is unable to pass urine or can only pass a little urine. This condition can happen suddenly and last for a short time. If left untreated, it can become long-term (chronic) and result in kidney damage or other serious complications. What are the causes? This condition may be caused by:  Obstruction or narrowing of the tube that drains the bladder (urethra). This may be caused by surgery, problems with nearby organs, or injury to the bladder or urethra.  Problems with the nerves in the bladder.  Tumors in the area of the pelvis, bladder, or urethra.  Certain medicines.  Bladder or urinary tract infection.  Constipation. What increases the risk? This condition is more likely to develop in older men. As men age, their prostate may become larger and may start to press or squeeze on the bladder or the urethra. Other chronic health conditions can increase the risk of acute urinary retention. These include:  Diseases such as multiple sclerosis.  Spinal cord injuries.  Diabetes.  Degenerative cognitive conditions, such as delirium or dementia.  Psychological conditions. A man may hold his urine due to trauma or because he does not want to use the bathroom. What are the signs or symptoms? Symptoms of this condition include:  Trouble urinating.  Pain in the lower abdomen. How is this diagnosed? This condition is diagnosed based on a physical exam and your medical history. You may also have other tests, including:  An ultrasound of the bladder or kidneys or both.  Blood tests.  A urine analysis.  Additional tests may be needed, such as a CT scan, MRI, and kidney or bladder function tests. How is this treated? Treatment for this condition may include:  Medicines.  Placing a thin, sterile tube (catheter) into the bladder to drain urine out of the body. This is called an indwelling urinary  catheter. After it is inserted, the catheter is held in place with a small balloon that is filled with sterile water. Urine drains from the catheter into a collection bag outside of the body.  Behavioral therapy.  Treatment for other conditions. If needed, you may be treated in the hospital for kidney function problems or to manage other complications. Follow these instructions at home: Medicines  Take over-the-counter and prescription medicines only as told by your health care provider. Avoid certain medicines, such as decongestants, antihistamines, and some prescription medicines. Do not take any medicine unless your health care provider approves.  If you were prescribed an antibiotic medicine, take it as told by your health care provider. Do not stop using the antibiotic even if you start to feel better. General instructions  Do not use any products that contain nicotine or tobacco. These products include cigarettes, chewing tobacco, and vaping devices, such as e-cigarettes. If you need help quitting, ask your health care provider.  Drink enough fluid to keep your urine pale yellow.  If you have an indwelling urinary catheter, follow the instructions from your health care provider.  Monitor any changes in your symptoms. Tell your health care provider about any changes.  If instructed, monitor your blood pressure at home. Report changes as told by your health care provider.  Keep all follow-up visits. This is important. Contact a health care provider if:  You have uncomfortable bladder contractions that you cannot control (spasms).  You leak urine with the spasms. Get help right away if:  You have chills or a  fever.  You have blood in your urine.  You have a catheter and the following happens: ? Your catheter stops draining urine. ? Your catheter falls out. Summary  Acute urinary retention is a condition in which a person is unable to pass urine or can only pass a little  urine. If left untreated, this condition can result in kidney damage or other serious complications.  An enlarged prostate may cause this condition. As men age, their prostate gland may become larger and may press or squeeze on the bladder or the urethra.  Treatment for this condition may include medicines and placement of an indwelling urinary catheter.  Monitor any changes in your symptoms. Tell your health care provider about any changes. This information is not intended to replace advice given to you by your health care provider. Make sure you discuss any questions you have with your health care provider. Document Revised: 09/04/2020 Document Reviewed: 09/04/2020 Elsevier Patient Education  2021 [employer] and Patient Education  Perform catheter care every day.  You can do this while in the shower, but NOT while taking a tub bath.  You will need the following supplies: -mild soap, such as Dove -water -a clean washcloth (not one already used for bathing) or a 4x4 piece of gauze -1 Cath-Secure -night drainage bag -2 alcohol swabs  1. Was you hands thoroughly with soap and water 2. Using mild soap and water, clan your genital area a. Men should retract the foreskin, if needed, and clean the area, including the penis b. Women should separate the labia, and clean the area from front to back  3. Clean your urinary opening, which is where the catheter enters your body. 4. Clean the catheter from where it enters your body and then down, away from your body.  Hold the catheter at the point it enters your body so that you don't put tension on it. 5. Rinse the area well and dry it gently.  Changing the drainage bag You will change your drainage bag twice a day -in the morning after you shower, change he night bag to the leg bag -at night before you go to bed, change the leg bag to the night bag  1. Wash your hands thoroughly with soap and water 2. Empty the  urine from the drainage bag into the toilet before you change it 3. Pinch off the catheter with your fingers and disconnect the used bag 4. Wipe the end of the catheter using an alcohol pad 5. Wipe the connector on the bag using the second alcohol pad 6. Connect the clean bag to the catheter and release your finger pinch 7. Check all connections.  Straighten any kinks or twits in the tubing  Caring for the Leg bag -always wear the leg bag below your knee.  This will help it drain. -keep the leg bag secure with the velcro straps.  If the straps leave a mark on your leg they are to tight and should be loosened.  Leaving the straps too tight can decrease you circulation and lead to blood clots. -empty the leg bag through the spout at the bottom every 2-4 hours as needed.  Do not let the bag become completely full. -do not lie down for longer than 2 hours while you are wearing the leg bag.  Caring for the Night Bag -always keep the night bag below the level of your bladder . -to hang your night bag while you  sleep, place a clean plastic bag inside of a wastebasket.  Hang the night bag on the inside of the wastebasket.  Cleaning the Drainage bag 1. Wash you hands thoroughly with soap and water. 2. Rinse the equipment with cool water.  Do not use hot water it can damage the plastic equipment. 3. Was the equipment with a mild liquid detergent (ivory) and rinse with cool water. 4. To decrease odor, fill the bag halfway with a mixture of 1 part vinegar and 3 parts water. Shake the bag and let it sit for 15 minutes. 5. Rinse the bag with cool water and hang it up to dry.  Preventing infection -keep the drainage bag below the level of your bladder and off the floor at all times. -keep the catheter secured to your thigh to prevent it from moving. -do not lie on or block the flow of urine in the tubing. -shower daily to keep the catheter clean. -clean your hands before and after touching the catheter  or bag. -the spout of the drainage bag should never touch the side of the toilet or any emptying container.  Special Points -You may see some blood or urine around where the catheter enters your body, especially when walking or having a bowel movement.  This is normal, as long as there is urine draining into the drainage bag.  If you experience significant leakage around  catheter tube where it enters your urethra possibly associated with lower abdominal cramping you could be having a bladder spasm.  Please notify your doctor and we can prescribe you a medication to improve your symptoms. -drink 1-2 glasses of liquids every 2 hours while you're awake.  Call your doctor immediately if -your catheter comes out, do not try to replace it yourself -you have temperature of 101F (38.8C) or higher -you have decrease in the amount of urine you are making -you have foul-smelling urine -you have bright red blood or large blood clots in your urine -you have abdominal pain and no urine in your catheter bag    Acute Urinary Retention, Male  Acute urinary retention is when a person cannot pee (urinate) at all, or can only pee a little. This can come on all of a sudden. If it is not treated, it can lead to kidney problems or other serious problems. What are the causes?  A problem with the tube that drains the bladder (urethra).  Problems with the nerves in the bladder.  Tumors.  Certain medicines.  An infection.  Having trouble pooping (constipation). What increases the risk? Older men are more at risk because their prostate gland may become larger as they age. Other conditions also can increase risk. These include:  Diseases, such as multiple sclerosis.  Injury to the spinal cord.  Diabetes.  A condition that affects the way the brain works, such as dementia.  Holding back urine due to trauma or because you do not want to use the bathroom. What are the signs or symptoms?  Trouble  peeing.  Pain in the lower belly. How is this treated? Treatment for this condition may include:  Medicines.  Placing a thin, germ-free tube (catheter) into the bladder to drain pee out of the body.  Therapy to treat mental health conditions.  Treatment for conditions that may cause this. If needed, you may be treated in the hospital for kidney problems or to manage other problems. Follow these instructions at home: Medicines  Take over-the-counter and prescription medicines only as told  by your doctor. Ask your doctor what medicines you should stay away from.  If you were given an antibiotic medicine, take it as told by your doctor. Do not stop taking it, even if you start to feel better. General instructions  Do not smoke or use any products that contain nicotine or tobacco. If you need help quitting, ask your doctor.  Drink enough fluid to keep your pee pale yellow.  If you were sent home with a tube that drains the bladder, take care of it as told by your doctor.  Watch for changes in your symptoms. Tell your doctor about them.  If told, keep track of changes in your blood pressure at home. Tell your doctor about them.  Keep all follow-up visits. Contact a doctor if:  You have spasms in your bladder that you cannot stop.  You leak pee when you have spasms. Get help right away if:  You have chills or a fever.  You have blood in your pee.  You have a tube that drains pee from the bladder and these things happen: ? The tube stops draining pee. ? The tube falls out. Summary  Acute urinary retention is when you cannot pee at all or you pee too little.  If this condition is not treated, it can lead to kidney problems or other serious problems.  If you were sent home with a tube (catheter) that drains the bladder, take care of it as told by your doctor.  Watch for changes in your symptoms. Tell your doctor about them. This information is not intended to replace  advice given to you by your health care provider. Make sure you discuss any questions you have with your health care provider. Document Revised: 09/04/2020 Document Reviewed: 09/04/2020 Elsevier Patient Education  2021 [employer].

## 2021-03-26 NOTE — Progress Notes (Signed)
03/26/2021 11:48 AM   Caleb [name] 02-Nov-1925 [id number]  Referring provider: Sharilyn Sites, MD [address] [place],  [place] [address]  Chief Complaint  Patient presents with  . Follow-up    HPI: Caleb [name] is a 87yo here for followup for urinary retention. He has an indwelling foley. He did perform CIC but now due to arthritis he cannot perform CIC. No UTIs since last visit. Foley was changed 1 month ago   PMH: Past Medical History:  Diagnosis Date  . Coronary artery disease   . Hyperlipidemia   . Hyperlipidemia   . Obesity   . Penile cancer ([place])   . Swelling of extremity    Left Leg    Surgical History: Past Surgical History:  Procedure Laterality Date  . APPENDECTOMY    . BACK SURGERY    . CHOLECYSTECTOMY    . CORONARY ANGIOPLASTY  11/2005   RCA PCI AND STENTING WITH A CYPHER DES  . CORONARY ARTERY BYPASS GRAFT    . HIATAL HERNIA REPAIR    . lymph node removal    . NM MYOCAR MULTIPLE W/SPECT  11/26/2009   EF 62%. NORMAL MYOCARDIAL PERFUSION STUDY.  . TRANSTHORACIC ECHOCARDIOGRAM  12/22/2005   MILD TO MOD AORTIC SCLEROSIS W/O STENOSIS. MILD TO MODERATE MITRAL CALCIFICATION. LA- MILDLY DILATED.    Home Medications:  Allergies as of 03/26/2021   No Known Allergies     Medication List       Accurate as of March 26, 2021 11:48 AM. If you have any questions, ask your nurse or doctor.        acetaminophen 325 MG tablet Commonly known as: TYLENOL Take 1-2 tablets (325-650 mg total) by mouth every 6 (six) hours as needed for fever, headache, mild pain or moderate pain.   aspirin EC 81 MG tablet Take 81 mg by mouth daily.   CALCIUM-D PO Take 1 tablet by mouth daily.   cefpodoxime 200 MG tablet Commonly known as: VANTIN Take 1 tablet (200 mg total) by mouth 2 (two) times daily.   clopidogrel 75 MG tablet Commonly known as: PLAVIX TAKE 1 TABLET EVERY DAY   docusate sodium 100 MG capsule Commonly known as: COLACE Take 1 capsule (100 mg  total) by mouth every 12 (twelve) hours.   doxycycline 100 MG capsule Commonly known as: VIBRAMYCIN   furosemide 20 MG tablet Commonly known as: LASIX   magnesium hydroxide 400 MG/5ML suspension Commonly known as: MILK OF MAGNESIA Take 30 mLs by mouth daily as needed for mild constipation.   metoprolol tartrate 25 MG tablet Commonly known as: LOPRESSOR TAKE 1 TABLET TWICE DAILY (NEED MD APPOINTMENT)   multivitamin with minerals Tabs tablet Take 1 tablet by mouth daily. Centrum Silver   nitrofurantoin (macrocrystal-monohydrate) 100 MG capsule Commonly known as: MACROBID Take 1 capsule (100 mg total) by mouth every 12 (twelve) hours.   nystatin powder Commonly known as: MYCOSTATIN/NYSTOP Apply topically.   polyethylene glycol powder 17 GM/SCOOP powder Commonly known as: GLYCOLAX/MIRALAX Take 17 g by mouth daily as needed for mild constipation or moderate constipation.   PreserVision AREDS 2 Caps Take by mouth.   simvastatin 20 MG tablet Commonly known as: ZOCOR TAKE 1 TABLET AT BEDTIME   vitamin C 1000 MG tablet Take 1,000 mg by mouth daily.       Allergies: No Known Allergies  Family History: Family History  Problem Relation Age of Onset  . Cancer Father   . Cancer Sister   . Cancer  Brother   . Cancer Sister   . Cancer Other     Social History:  reports that he quit smoking about 37 years ago. His smoking use included cigarettes. He has never used smokeless tobacco. He reports that he does not drink alcohol and does not use drugs.  ROS: All other review of systems were reviewed and are negative except what is noted above in HPI  Physical Exam: BP (!) 144/75   Pulse 84   Temp 98.4 F (36.9 C)   Resp 18   Ht 5\' 10"  (1.778 m)   Wt 255 lb (115.7 kg)   BMI 36.59 kg/m   Constitutional:  Alert and oriented, No acute distress. HEENT: [place] AT, moist mucus membranes.  Trachea midline, no masses. Cardiovascular: No clubbing, cyanosis, or edema. Respiratory:  Normal respiratory effort, no increased work of breathing. GI: Abdomen is soft, nontender, nondistended, no abdominal masses GU: No CVA tenderness.  Lymph: No cervical or inguinal lymphadenopathy. Skin: No rashes, bruises or suspicious lesions. Neurologic: Grossly intact, no focal deficits, moving all 4 extremities. Psychiatric: Normal mood and affect.  Laboratory Data: Lab Results  Component Value Date   WBC 5.7 03/17/2016   HGB 12.6 (L) 01/07/2018   HCT 37.0 (L) 01/07/2018   MCV 93.0 03/17/2016   PLT 201 03/17/2016    Lab Results  Component Value Date   CREATININE 1.20 01/07/2018    No results found for: PSA  No results found for: TESTOSTERONE  No results found for: HGBA1C  Urinalysis    Component Value Date/Time   COLORURINE YELLOW 01/09/2018 0830   APPEARANCEUR CLOUDY (A) 01/09/2018 0830   LABSPEC 1.014 01/09/2018 0830   PHURINE 6.0 01/09/2018 0830   GLUCOSEU NEGATIVE 01/09/2018 0830   HGBUR SMALL (A) 01/09/2018 0830   BILIRUBINUR negative 03/25/2020 1156   KETONESUR negative 03/25/2020 1156   KETONESUR 20 (A) 01/09/2018 0830   PROTEINUR =100 (A) 03/25/2020 1156   PROTEINUR 100 (A) 01/09/2018 0830   UROBILINOGEN 0.2 03/25/2020 1156   NITRITE Negative 03/25/2020 1156   NITRITE NEGATIVE 01/09/2018 0830   LEUKOCYTESUR Small (1+) (A) 03/25/2020 1156    Lab Results  Component Value Date   BACTERIA MANY (A) 01/09/2018    Pertinent Imaging:  No results found for this or any previous visit.  Results for orders placed during the hospital encounter of 05/13/05  US VenoUS Imaging Bilateral  Narrative Clinical data: Bilateral lower extremity edema and pain  Bilateral lower extremity venous Doppler ultrasound:  The lower extremity deep venous system demonstrates normal compressibility, phasicity, and augmentation.   No evidence of DVT.  Impression: 1. Negative for   lower extremity DVT  Provider: Mertie Clause  No results found for this or any  previous visit.  No results found for this or any previous visit.  No results found for this or any previous visit.  No results found for this or any previous visit.  No results found for this or any previous visit.  No results found for this or any previous visit.   Assessment & Plan:    1. Urinary retention -continue monthly foley changes -RTC 1 year   No follow-ups on file.  Nicolette Bang, MD  [facility] [place]

## 2021-03-26 NOTE — Progress Notes (Signed)
Cath Change/ Replacement  Patient is present today for a catheter change due to urinary retention.  47ml of water was removed from the balloon, a 16FR foley cath was removed without difficulty.  Patient was cleaned and prepped in a sterile fashion with betadine. A 16 FR foley cath was replaced into the bladder no complications were noted Urine return was noted 69ml and urine was yellow in color. The balloon was filled with 16ml of sterile water. A leg bag was attached for drainage.   Patient was given proper instruction on catheter care.    Performed by: Sierrah Luevano, lpn  Follow up: [place], lpn

## 2021-04-02 ENCOUNTER — Other Ambulatory Visit: Payer: Self-pay

## 2021-04-02 ENCOUNTER — Encounter: Payer: Self-pay | Admitting: Podiatry

## 2021-04-02 ENCOUNTER — Ambulatory Visit (INDEPENDENT_AMBULATORY_CARE_PROVIDER_SITE_OTHER): Payer: Medicare Other | Admitting: Podiatry

## 2021-04-02 DIAGNOSIS — B351 Tinea unguium: Secondary | ICD-10-CM

## 2021-04-02 DIAGNOSIS — I739 Peripheral vascular disease, unspecified: Secondary | ICD-10-CM

## 2021-04-02 DIAGNOSIS — L84 Corns and callosities: Secondary | ICD-10-CM

## 2021-04-02 DIAGNOSIS — M79675 Pain in left toe(s): Secondary | ICD-10-CM

## 2021-04-02 DIAGNOSIS — M79674 Pain in right toe(s): Secondary | ICD-10-CM | POA: Diagnosis not present

## 2021-04-06 NOTE — Progress Notes (Signed)
  Subjective:  Patient ID: Caleb [name], male    DOB: 07-12-1925,  MRN: [id number]  87 y.o. male presents with for at risk foot care. Patient has h/o PAD and painful thick toenails that are difficult to trim. Pain interferes with ambulation. Aggravating factors include wearing enclosed shoe gear. Pain is relieved with periodic professional debridement.Marland Kitchen    He is followed by vascular for lymphedema. Not able to donn compression hose due to weakened hand strength.  He relates tender calluses on plantar aspect of both feet today.  Review of Systems: Negative except as noted in the HPI.   PCP is Dr. Sharilyn Sites and last visit was 09/03/2020.  No Known Allergies   Objective:   Constitutional Pt is a pleasant 87 y.o. Caucasian male in NAD.Marland Kitchen AAO x 3.   Vascular Capillary fill time to digits <3 seconds b/l lower extremities. Faintly palpable DP pulse(s) left lower extremity. Nonpalpable DP pulse(s) right lower extremity. Nonpalpable PT pulse(s) b/l lower extremities. Pedal hair absent. Lower extremity skin temperature gradient warm to cool. Dependent rubor noted b/l lower extremities. No pain with calf compression b/l. Lymphedema present b/l lower extremities. No ischemia or gangrene noted b/l lower extremities. No cyanosis or clubbing noted.  Neurologic Normal speech. Oriented to person, place, and time. Protective sensation intact 5/5 intact bilaterally with 10g monofilament b/l. Vibratory sensation intact b/l. Proprioception intact bilaterally.  Dermatologic Pedal skin is thin shiny, atrophic b/l lower extremities. No open wounds bilaterally. No interdigital macerations bilaterally. Toenails 1-5 b/l elongated, discolored, dystrophic, thickened, crumbly with subungual debris and tenderness to dorsal palpation. Hyperkeratotic lesion(s) submet head 5 left foot and submet head 5 right foot and medial aspect of right heel. No erythema, no edema, no drainage, no fluctuance.  Orthopedic: Normal muscle  strength 5/5 to all lower extremity muscle groups bilaterally. No pain crepitus or joint limitation noted with ROM b/l. Hallux valgus with bunion deformity noted b/l lower extremities. Hammertoes noted to the 2-5 bilaterally. Utilizes rollator for ambulation assistance.   Radiographs: None Assessment:   1. Pain due to onychomycosis of toenails of both feet   2. Callus   3. Peripheral vascular disease with claudication ([place])    Plan:  Patient was evaluated and treated and all questions answered.  Onychomycosis with pain -Nails palliatively debridement as below. -Educated on self-care  Procedure: Nail Debridement Rationale: Pain Type of Debridement: manual, sharp debridement. Instrumentation: Nail nipper, rotary burr. Number of Nails: 10  -Examined patient. -No new findings. No new orders. -Toenails 1-5 b/l were debrided in length and girth with sterile nail nippers and dremel without iatrogenic bleeding.  -Calluses pared submetatarsal head(s) 5 b/l and right heel utilizing sterile scalpel blade without incident. -Patient to report any pedal injuries to medical professional immediately. -Patient to continue soft, supportive shoe gear daily. -Patient/POA to call should there be question/concern in the interim.  Return in about 3 months (around 07/02/2021) for nail trim.  Marzetta Board, DPM

## 2021-04-25 ENCOUNTER — Ambulatory Visit (INDEPENDENT_AMBULATORY_CARE_PROVIDER_SITE_OTHER): Payer: Medicare Other

## 2021-04-25 ENCOUNTER — Other Ambulatory Visit: Payer: Self-pay

## 2021-04-25 DIAGNOSIS — R339 Retention of urine, unspecified: Secondary | ICD-10-CM | POA: Diagnosis not present

## 2021-04-25 NOTE — Progress Notes (Signed)
Cath Change/ Replacement  Patient is present today for a catheter change due to urinary retention.  74ml of water was removed from the balloon, a 16FR foley cath was removed with out difficulty.  Patient was cleaned and prepped in a sterile fashion with betadine. A 16 FR foley cath was replaced into the bladder no complications were noted Urine return was noted 29ml and urine was yellow in color. The balloon was filled with 40ml of sterile water. A leg  bag was attached for drainage.  A night bag was also given to the patient and patient was given instruction on how to change from one bag to another. Patient was given proper instruction on catheter care.    Performed by: Park Pope RN  Follow up: 1 month [place]

## 2021-04-26 DIAGNOSIS — I10 Essential (primary) hypertension: Secondary | ICD-10-CM | POA: Diagnosis not present

## 2021-04-26 DIAGNOSIS — E7849 Other hyperlipidemia: Secondary | ICD-10-CM | POA: Diagnosis not present

## 2021-05-16 ENCOUNTER — Other Ambulatory Visit: Payer: Self-pay

## 2021-05-16 ENCOUNTER — Ambulatory Visit (INDEPENDENT_AMBULATORY_CARE_PROVIDER_SITE_OTHER): Payer: Medicare Other

## 2021-05-16 DIAGNOSIS — R339 Retention of urine, unspecified: Secondary | ICD-10-CM

## 2021-05-16 NOTE — Progress Notes (Signed)
Cath Change/ Replacement  Patient is present today for a catheter change due to urinary retention.  10ml of water was removed from the balloon, a 16FR foley cath was removed with out difficulty.  Patient was cleaned and prepped in a sterile fashion with betadine. A 16 FR foley cath was replaced into the bladder no complications were noted Urine return was noted 20ml and urine was yellow in color. The balloon was filled with 10ml of sterile water. A leg bag was attached for drainage.  A night bag was also given to the patient and patient was given instruction on how to change from one bag to another. Patient was given proper instruction on catheter care.    Performed by: Faris Coolman RN  Follow up: 1 month [place] cath change  

## 2021-06-09 DIAGNOSIS — H353231 Exudative age-related macular degeneration, bilateral, with active choroidal neovascularization: Secondary | ICD-10-CM | POA: Diagnosis not present

## 2021-06-20 ENCOUNTER — Ambulatory Visit (INDEPENDENT_AMBULATORY_CARE_PROVIDER_SITE_OTHER): Payer: Medicare Other

## 2021-06-20 ENCOUNTER — Other Ambulatory Visit: Payer: Self-pay

## 2021-06-20 DIAGNOSIS — R339 Retention of urine, unspecified: Secondary | ICD-10-CM | POA: Diagnosis not present

## 2021-06-20 NOTE — Progress Notes (Signed)
Cath Change/ Replacement  Patient is present today for a catheter change due to urinary retention.  18ml of water was removed from the balloon, a 16FR foley cath was removed with out difficulty.  Patient was cleaned and prepped in a sterile fashion with betadine. A 16 FR foley cath was replaced into the bladder no complications were noted Urine return was noted 33ml and urine was yellow in color. The balloon was filled with 69ml of sterile water. A leg bag was attached for drainage.  A night bag was also given to the patient and patient was given instruction on how to change from one bag to another. Patient was given proper instruction on catheter care.    Performed by: Estill Bamberg RN  Follow up: 1 month NV

## 2021-06-26 DIAGNOSIS — I10 Essential (primary) hypertension: Secondary | ICD-10-CM | POA: Diagnosis not present

## 2021-06-26 DIAGNOSIS — E7849 Other hyperlipidemia: Secondary | ICD-10-CM | POA: Diagnosis not present

## 2021-06-27 ENCOUNTER — Ambulatory Visit: Payer: Medicare Other

## 2021-07-11 ENCOUNTER — Emergency Department (HOSPITAL_COMMUNITY): Payer: Medicare Other

## 2021-07-11 ENCOUNTER — Other Ambulatory Visit: Payer: Self-pay

## 2021-07-11 ENCOUNTER — Encounter (HOSPITAL_COMMUNITY): Payer: Self-pay | Admitting: Emergency Medicine

## 2021-07-11 ENCOUNTER — Inpatient Hospital Stay (HOSPITAL_COMMUNITY)
Admission: EM | Admit: 2021-07-11 | Discharge: 2021-07-15 | DRG: 558 | Disposition: A | Discharge status: Skilled Nursing Facility | Payer: Medicare Other | Attending: Internal Medicine | Admitting: Internal Medicine

## 2021-07-11 DIAGNOSIS — Z20822 Contact with and (suspected) exposure to covid-19: Secondary | ICD-10-CM | POA: Diagnosis present

## 2021-07-11 DIAGNOSIS — G319 Degenerative disease of nervous system, unspecified: Secondary | ICD-10-CM | POA: Diagnosis not present

## 2021-07-11 DIAGNOSIS — R7401 Elevation of levels of liver transaminase levels: Secondary | ICD-10-CM

## 2021-07-11 DIAGNOSIS — E669 Obesity, unspecified: Secondary | ICD-10-CM | POA: Diagnosis not present

## 2021-07-11 DIAGNOSIS — R21 Rash and other nonspecific skin eruption: Secondary | ICD-10-CM | POA: Diagnosis present

## 2021-07-11 DIAGNOSIS — Z79899 Other long term (current) drug therapy: Secondary | ICD-10-CM

## 2021-07-11 DIAGNOSIS — N390 Urinary tract infection, site not specified: Secondary | ICD-10-CM | POA: Diagnosis not present

## 2021-07-11 DIAGNOSIS — R000 Tachycardia, unspecified: Secondary | ICD-10-CM | POA: Diagnosis not present

## 2021-07-11 DIAGNOSIS — Z8549 Personal history of malignant neoplasm of other male genital organs: Secondary | ICD-10-CM

## 2021-07-11 DIAGNOSIS — I2510 Atherosclerotic heart disease of native coronary artery without angina pectoris: Secondary | ICD-10-CM | POA: Diagnosis present

## 2021-07-11 DIAGNOSIS — T83511A Infection and inflammatory reaction due to indwelling urethral catheter, initial encounter: Secondary | ICD-10-CM | POA: Diagnosis present

## 2021-07-11 DIAGNOSIS — Z6836 Body mass index (BMI) 36.0-36.9, adult: Secondary | ICD-10-CM | POA: Diagnosis not present

## 2021-07-11 DIAGNOSIS — R6251 Failure to thrive (child): Secondary | ICD-10-CM | POA: Diagnosis not present

## 2021-07-11 DIAGNOSIS — Y846 Urinary catheterization as the cause of abnormal reaction of the patient, or of later complication, without mention of misadventure at the time of the procedure: Secondary | ICD-10-CM | POA: Diagnosis present

## 2021-07-11 DIAGNOSIS — R627 Adult failure to thrive: Secondary | ICD-10-CM | POA: Diagnosis not present

## 2021-07-11 DIAGNOSIS — M25552 Pain in left hip: Secondary | ICD-10-CM | POA: Diagnosis not present

## 2021-07-11 DIAGNOSIS — Y92009 Unspecified place in unspecified non-institutional (private) residence as the place of occurrence of the external cause: Secondary | ICD-10-CM | POA: Diagnosis not present

## 2021-07-11 DIAGNOSIS — N289 Disorder of kidney and ureter, unspecified: Secondary | ICD-10-CM | POA: Diagnosis not present

## 2021-07-11 DIAGNOSIS — M533 Sacrococcygeal disorders, not elsewhere classified: Secondary | ICD-10-CM | POA: Diagnosis not present

## 2021-07-11 DIAGNOSIS — R531 Weakness: Secondary | ICD-10-CM | POA: Diagnosis not present

## 2021-07-11 DIAGNOSIS — E785 Hyperlipidemia, unspecified: Secondary | ICD-10-CM | POA: Diagnosis present

## 2021-07-11 DIAGNOSIS — Z9049 Acquired absence of other specified parts of digestive tract: Secondary | ICD-10-CM

## 2021-07-11 DIAGNOSIS — N319 Neuromuscular dysfunction of bladder, unspecified: Secondary | ICD-10-CM | POA: Diagnosis present

## 2021-07-11 DIAGNOSIS — M6282 Rhabdomyolysis: Secondary | ICD-10-CM | POA: Diagnosis not present

## 2021-07-11 DIAGNOSIS — S299XXA Unspecified injury of thorax, initial encounter: Secondary | ICD-10-CM | POA: Diagnosis not present

## 2021-07-11 DIAGNOSIS — W1830XA Fall on same level, unspecified, initial encounter: Secondary | ICD-10-CM | POA: Diagnosis present

## 2021-07-11 DIAGNOSIS — R9082 White matter disease, unspecified: Secondary | ICD-10-CM | POA: Diagnosis not present

## 2021-07-11 DIAGNOSIS — W19XXXA Unspecified fall, initial encounter: Secondary | ICD-10-CM | POA: Diagnosis not present

## 2021-07-11 DIAGNOSIS — Z7902 Long term (current) use of antithrombotics/antiplatelets: Secondary | ICD-10-CM

## 2021-07-11 DIAGNOSIS — Z87891 Personal history of nicotine dependence: Secondary | ICD-10-CM

## 2021-07-11 DIAGNOSIS — S0990XA Unspecified injury of head, initial encounter: Secondary | ICD-10-CM | POA: Diagnosis not present

## 2021-07-11 DIAGNOSIS — Z955 Presence of coronary angioplasty implant and graft: Secondary | ICD-10-CM

## 2021-07-11 DIAGNOSIS — M25551 Pain in right hip: Secondary | ICD-10-CM | POA: Diagnosis not present

## 2021-07-11 DIAGNOSIS — Z7982 Long term (current) use of aspirin: Secondary | ICD-10-CM

## 2021-07-11 DIAGNOSIS — M47816 Spondylosis without myelopathy or radiculopathy, lumbar region: Secondary | ICD-10-CM | POA: Diagnosis not present

## 2021-07-11 DIAGNOSIS — H353211 Exudative age-related macular degeneration, right eye, with active choroidal neovascularization: Secondary | ICD-10-CM | POA: Diagnosis present

## 2021-07-11 LAB — COMPREHENSIVE METABOLIC PANEL
ALT: 80 U/L — ABNORMAL HIGH (ref 0–44)
AST: 91 U/L — ABNORMAL HIGH (ref 15–41)
Albumin: 3.4 g/dL — ABNORMAL LOW (ref 3.5–5.0)
Alkaline Phosphatase: 59 U/L (ref 38–126)
Anion gap: 8 (ref 5–15)
BUN: 63 mg/dL — ABNORMAL HIGH (ref 8–23)
CO2: 24 mmol/L (ref 22–32)
Calcium: 9.2 mg/dL (ref 8.9–10.3)
Chloride: 105 mmol/L (ref 98–111)
Creatinine, Ser: 1.39 mg/dL — ABNORMAL HIGH (ref 0.61–1.24)
GFR, Estimated: 47 mL/min — ABNORMAL LOW (ref >60)
Glucose, Bld: 133 mg/dL — ABNORMAL HIGH (ref 70–99)
Potassium: 4.6 mmol/L (ref 3.5–5.1)
Sodium: 137 mmol/L (ref 135–145)
Total Bilirubin: 1.6 mg/dL — ABNORMAL HIGH (ref 0.3–1.2)
Total Protein: 6.3 g/dL — ABNORMAL LOW (ref 6.5–8.1)

## 2021-07-11 LAB — CBC WITH DIFFERENTIAL/PLATELET
Abs Immature Granulocytes: 0.1 10*3/uL — ABNORMAL HIGH (ref 0.00–0.07)
Basophils Absolute: 0 10*3/uL (ref 0.0–0.1)
Basophils Relative: 0 %
Eosinophils Absolute: 0 10*3/uL (ref 0.0–0.5)
Eosinophils Relative: 0 %
HCT: 35.7 % — ABNORMAL LOW (ref 39.0–52.0)
Hemoglobin: 11.8 g/dL — ABNORMAL LOW (ref 13.0–17.0)
Immature Granulocytes: 1 %
Lymphocytes Relative: 5 %
Lymphs Abs: 0.7 10*3/uL (ref 0.7–4.0)
MCH: 32.4 pg (ref 26.0–34.0)
MCHC: 33.1 g/dL (ref 30.0–36.0)
MCV: 98.1 fL (ref 80.0–100.0)
Monocytes Absolute: 1.2 10*3/uL — ABNORMAL HIGH (ref 0.1–1.0)
Monocytes Relative: 9 %
Neutro Abs: 11.9 10*3/uL — ABNORMAL HIGH (ref 1.7–7.7)
Neutrophils Relative %: 85 %
Platelets: 218 10*3/uL (ref 150–400)
RBC: 3.64 MIL/uL — ABNORMAL LOW (ref 4.22–5.81)
RDW: 14.7 % (ref 11.5–15.5)
WBC: 13.9 10*3/uL — ABNORMAL HIGH (ref 4.0–10.5)
nRBC: 0.4 % — ABNORMAL HIGH (ref 0.0–0.2)

## 2021-07-11 LAB — URINALYSIS, ROUTINE W REFLEX MICROSCOPIC
Bilirubin Urine: NEGATIVE
Glucose, UA: NEGATIVE mg/dL
Ketones, ur: 20 mg/dL — AB
Nitrite: NEGATIVE
Protein, ur: 30 mg/dL — AB
Specific Gravity, Urine: 1.019 (ref 1.005–1.030)
pH: 7 (ref 5.0–8.0)

## 2021-07-11 LAB — RESP PANEL BY RT-PCR (FLU A&B, COVID) ARPGX2
Influenza A by PCR: NEGATIVE
Influenza B by PCR: NEGATIVE
SARS Coronavirus 2 by RT PCR: NEGATIVE

## 2021-07-11 LAB — TROPONIN I (HIGH SENSITIVITY)
Troponin I (High Sensitivity): 56 ng/L — ABNORMAL HIGH (ref <18)
Troponin I (High Sensitivity): 60 ng/L — ABNORMAL HIGH (ref <18)

## 2021-07-11 LAB — PROTIME-INR
INR: 1.3 — ABNORMAL HIGH (ref 0.8–1.2)
Prothrombin Time: 16.3 seconds — ABNORMAL HIGH (ref 11.4–15.2)

## 2021-07-11 LAB — MAGNESIUM: Magnesium: 2.4 mg/dL (ref 1.7–2.4)

## 2021-07-11 LAB — CK: Total CK: 897 U/L — ABNORMAL HIGH (ref 49–397)

## 2021-07-11 MED ORDER — ONDANSETRON HCL 4 MG/2ML IJ SOLN: 4.0000 mg | Freq: Four times a day (QID) | INTRAMUSCULAR | Status: DC | PRN

## 2021-07-11 MED ORDER — OXYCODONE HCL 5 MG PO TABS
5.0000 mg | ORAL_TABLET | ORAL | Status: DC | PRN
  Administered 2021-07-13 (×3): 5 mg via ORAL
  Filled 2021-07-11 (×3): qty 1

## 2021-07-11 MED ORDER — FLUCONAZOLE IN SODIUM CHLORIDE 200-0.9 MG/100ML-% IV SOLN
INTRAVENOUS | Status: AC
  Filled 2021-07-11: qty 100

## 2021-07-11 MED ORDER — FENTANYL CITRATE (PF) 100 MCG/2ML IJ SOLN: 50.0000 ug | Freq: Once | INTRAMUSCULAR | Status: DC

## 2021-07-11 MED ORDER — ONDANSETRON HCL 4 MG PO TABS: 4.0000 mg | ORAL_TABLET | Freq: Four times a day (QID) | ORAL | Status: DC | PRN

## 2021-07-11 MED ORDER — ASPIRIN EC 81 MG PO TBEC
81.0000 mg | DELAYED_RELEASE_TABLET | Freq: Every day | ORAL | Status: DC
  Administered 2021-07-11 – 2021-07-15 (×5): 81 mg via ORAL
  Filled 2021-07-11 (×5): qty 1

## 2021-07-11 MED ORDER — SODIUM CHLORIDE 0.9 % IV SOLN: INTRAVENOUS | Status: DC

## 2021-07-11 MED ORDER — FLUCONAZOLE 100MG IVPB
100.0000 mg | INTRAVENOUS | Status: DC
  Administered 2021-07-11 – 2021-07-13 (×3): 100 mg via INTRAVENOUS
  Filled 2021-07-11 (×4): qty 50

## 2021-07-11 MED ORDER — ENOXAPARIN SODIUM 40 MG/0.4ML IJ SOSY
40.0000 mg | PREFILLED_SYRINGE | INTRAMUSCULAR | Status: DC
  Administered 2021-07-11 – 2021-07-14 (×4): 40 mg via SUBCUTANEOUS
  Filled 2021-07-11 (×4): qty 0.4

## 2021-07-11 MED ORDER — SODIUM CHLORIDE 0.9 % IV SOLN
1.0000 g | Freq: Once | INTRAVENOUS | Status: AC
  Administered 2021-07-11: 1 g via INTRAVENOUS
  Filled 2021-07-11: qty 10

## 2021-07-11 NOTE — Plan of Care (Signed)

## 2021-07-11 NOTE — ED Provider Notes (Signed)
[facility] EMERGENCY DEPARTMENT Provider Note   CSN: [id number] Arrival date & time: 07/11/21  1238     History Chief Complaint  Patient presents with   Caleb [name] is a 87 y.o. male.   Fall   This patient is a very pleasant 87 year old male who presents to the hospital today with generalized weakness.  Evidently this patient had been trying to walk into the bathroom on Saturday when he fell to the ground, he cannot remember the exact fall however he did remember striking his head during the fall, there is no loss of consciousness, he does not recall all of the events of the week because he has been on the ground for 6 or more days scooting around on his back or his knees trying to get for help, he has been calling at the top of his lungs for 6 days and now feels like he has no voice.  Finally someone likely the post office folks did a well check and the paramedics found the patient in the bathroom unable to get up.  He had diffuse bruising all over his back arms and legs, he has a Foley catheter and reported that he had been drinking out of the toilet because he could not get out of the bathroom.  He has not had anything to eat in 6 days, he is very uncomfortable very weak very tired but does not have any focal severe pain.  He has no difficulty with breathing.  The symptoms are persistent, constant, gradually worsened, has not been taking any of his medications because he could not get up off of the ground.  Past Medical History:  Diagnosis Date   Coronary artery disease    Hyperlipidemia    Hyperlipidemia    Obesity    Penile cancer ([place])    Swelling of extremity    Left Leg    Patient Active Problem List   Diagnosis Date Noted   Urinary retention 03/27/2020   Exudative age-related macular degeneration, right eye, with active choroidal neovascularization ([place]) 08/23/2017   BMI 32.0-32.9,adult 02/26/2016   Neurogenic bladder 02/26/2016   Scalp hematoma 01/13/2016    Self-catheterizes urinary bladder 01/13/2016   Pneumothorax, left 01/12/2016   Atypical chest pain 12/02/2015   Muscular deconditioning 02/20/2015   Penile cancer ([place]) 01/09/2015   Cellulitis 05/17/2014   Pain in limb- Slight pain- Left Leg 05/03/2014   Discoloration of skin-Left Leg 05/03/2014   Stasis edema with ulcer ([place]) 08/17/2013   Peripheral vascular disease, unspecified ([place]) 07/13/2013   Swelling of limb 07/13/2013   Coronary artery disease 06/07/2013   Left upper extremity numbness 06/07/2013   Hyperlipidemia    Knee pain 08/11/2011    Past Surgical History:  Procedure Laterality Date   APPENDECTOMY     BACK SURGERY     CHOLECYSTECTOMY     CORONARY ANGIOPLASTY  11/2005   RCA PCI AND STENTING WITH A CYPHER DES   CORONARY ARTERY BYPASS GRAFT     HIATAL HERNIA REPAIR     lymph node removal     NM MYOCAR MULTIPLE W/SPECT  11/26/2009   EF 62%. NORMAL MYOCARDIAL PERFUSION STUDY.   TRANSTHORACIC ECHOCARDIOGRAM  12/22/2005   MILD TO MOD AORTIC SCLEROSIS W/O STENOSIS. MILD TO MODERATE MITRAL CALCIFICATION. LA- MILDLY DILATED.       Family History  Problem Relation Age of Onset   Cancer Father    Cancer Sister    Cancer Brother  Cancer Sister    Cancer Other     Social History   Tobacco Use   Smoking status: Former    Types: Cigarettes    Quit date: 07/14/1983    Years since quitting: 38.0   Smokeless tobacco: Never  Vaping Use   Vaping Use: Never used  Substance Use Topics   Alcohol use: No   Drug use: No    Home Medications Prior to Admission medications   Medication Sig Start Date End Date Taking? Authorizing Provider  acetaminophen (TYLENOL) 325 MG tablet Take 1-2 tablets (325-650 mg total) by mouth every 6 (six) hours as needed for fever, headache, mild pain or moderate pain. 01/14/16   Nat Christen, PA-C  Ascorbic Acid (VITAMIN C) 1000 MG tablet Take 1,000 mg by mouth daily.     [provider]  aspirin EC 81 MG tablet Take 81 mg  by mouth daily.    [provider]  Calcium Carbonate-Vitamin D (CALCIUM-D PO) Take 1 tablet by mouth daily.    [provider]  cefpodoxime (VANTIN) 200 MG tablet Take 1 tablet (200 mg total) by mouth 2 (two) times daily. Patient not taking: Reported on 03/26/2021 05/17/20   Cleon Gustin, MD  clopidogrel (PLAVIX) 75 MG tablet TAKE 1 TABLET EVERY DAY 03/12/21   Lorretta Harp, MD  docusate sodium (COLACE) 100 MG capsule Take 1 capsule (100 mg total) by mouth every 12 (twelve) hours. Patient not taking: Reported on 03/26/2021 01/07/18   Elnora Morrison, MD  doxycycline (VIBRAMYCIN) 100 MG capsule  09/23/20   [provider]  furosemide (LASIX) 20 MG tablet  09/23/20   [provider]  magnesium hydroxide (MILK OF MAGNESIA) 400 MG/5ML suspension Take 30 mLs by mouth daily as needed for mild constipation.    [provider]  metoprolol tartrate (LOPRESSOR) 25 MG tablet TAKE 1 TABLET TWICE DAILY (NEED MD APPOINTMENT) 03/06/21   Lorretta Harp, MD  Multiple Vitamin (MULTIVITAMIN WITH MINERALS) TABS tablet Take 1 tablet by mouth daily. Centrum Silver    [provider]  Multiple Vitamins-Minerals (PRESERVISION AREDS 2) CAPS Take by mouth.    [provider]  nitrofurantoin, macrocrystal-monohydrate, (MACROBID) 100 MG capsule Take 1 capsule (100 mg total) by mouth every 12 (twelve) hours. Patient not taking: Reported on 03/26/2021 09/03/20   Cleon Gustin, MD  polyethylene glycol powder (GLYCOLAX/MIRALAX) powder Take 17 g by mouth daily as needed for mild constipation or moderate constipation.    [provider]  simvastatin (ZOCOR) 20 MG tablet TAKE 1 TABLET AT BEDTIME 09/12/20   Lorretta Harp, MD    Allergies    Patient has no known allergies.  Review of Systems   Review of Systems  All other systems reviewed and are negative.  Physical Exam Updated Vital Signs BP 130/66   Pulse 81   Temp 97.9 F (36.6 C)  (Oral)   Resp 18   Ht 1.778 m (5\' 10" )   Wt 115.7 kg   SpO2 95%   BMI 36.60 kg/m   Physical Exam Vitals and nursing note reviewed.  Constitutional:      General: He is not in acute distress.    Appearance: He is well-developed. He is ill-appearing.  HENT:     Head: Normocephalic and atraumatic.     Mouth/Throat:     Mouth: Mucous membranes are dry.     Pharynx: No oropharyngeal exudate.  Eyes:     General: No scleral icterus.  Right eye: No discharge.        Left eye: No discharge.     Conjunctiva/sclera: Conjunctivae normal.     Pupils: Pupils are equal, round, and reactive to light.  Neck:     Thyroid: No thyromegaly.     Vascular: No JVD.  Cardiovascular:     Rate and Rhythm: Normal rate and regular rhythm.     Heart sounds: Normal heart sounds. No murmur heard.   No friction rub. No gallop.  Pulmonary:     Effort: Pulmonary effort is normal. No respiratory distress.     Breath sounds: Normal breath sounds. No wheezing or rales.  Chest:     Chest wall: No tenderness.  Abdominal:     General: Bowel sounds are normal. There is no distension.     Palpations: Abdomen is soft. There is no mass.     Tenderness: There is no abdominal tenderness.  Genitourinary:    Comments: Foley catheter in place Musculoskeletal:        General: Swelling, tenderness and signs of injury present. No deformity. Normal range of motion.     Cervical back: Normal range of motion and neck supple.     Right lower leg: Edema present.     Left lower leg: Edema present.  Lymphadenopathy:     Cervical: No cervical adenopathy.  Skin:    General: Skin is warm and dry.     Findings: Bruising, erythema, lesion and rash present.     Comments: The patient has diffuse bruising and small skin tears over his entire body but primarily on the back the knees and the feet  Neurological:     Mental Status: He is alert.     Coordination: Coordination normal.     Comments: The patient is able to raise  his legs bilaterally, very weak, he has normal grips bilaterally, when I assist him sitting up in the bed it takes 2 people to get him to a sitting position.  Psychiatric:        Behavior: Behavior normal.    ED Results / Procedures / Treatments   Labs (all labs ordered are listed, but only abnormal results are displayed) Labs Reviewed  CK - Abnormal; Notable for the following components:      Result Value   Total CK 897 (*)    All other components within normal limits  CBC WITH DIFFERENTIAL/PLATELET - Abnormal; Notable for the following components:   WBC 13.9 (*)    RBC 3.64 (*)    Hemoglobin 11.8 (*)    HCT 35.7 (*)    nRBC 0.4 (*)    Neutro Abs 11.9 (*)    Monocytes Absolute 1.2 (*)    Abs Immature Granulocytes 0.10 (*)    All other components within normal limits  COMPREHENSIVE METABOLIC PANEL - Abnormal; Notable for the following components:   Glucose, Bld 133 (*)    BUN 63 (*)    Creatinine, Ser 1.39 (*)    Total Protein 6.3 (*)    Albumin 3.4 (*)    AST 91 (*)    ALT 80 (*)    Total Bilirubin 1.6 (*)    GFR, Estimated 47 (*)    All other components within normal limits  PROTIME-INR - Abnormal; Notable for the following components:   Prothrombin Time 16.3 (*)    INR 1.3 (*)    All other components within normal limits  URINALYSIS, ROUTINE W REFLEX MICROSCOPIC - Abnormal; Notable for the following  components:   APPearance CLOUDY (*)    Hgb urine dipstick SMALL (*)    Ketones, ur 20 (*)    Protein, ur 30 (*)    Leukocytes,Ua MODERATE (*)    Bacteria, UA MANY (*)    All other components within normal limits  TROPONIN I (HIGH SENSITIVITY) - Abnormal; Notable for the following components:   Troponin I (High Sensitivity) 56 (*)    All other components within normal limits  URINE CULTURE  MAGNESIUM    EKG None  Radiology DG Chest 1 View  Result Date: 07/11/2021 CLINICAL DATA:  Fall 6 days ago.  Chest and bilateral hip pain. EXAM: CHEST  1 VIEW COMPARISON:   Radiographs 01/14/2016 and 01/13/2016.  CT 12/20/2005. FINDINGS: 1317 hours. The heart size and mediastinal contours are stable with aortic atherosclerosis. The lungs appear clear. There is no pleural effusion or pneumothorax. There are old left-sided rib fractures and sequela of prior left AC joint injury with heterotopic ossification. No acute osseous findings evident. Telemetry leads overlie the chest. IMPRESSION: No acute cardiopulmonary process. Sequela of previous upper left chest trauma. Electronically Signed   By: Richardean Sale M.D.   On: 07/11/2021 13:54   CT Head Wo Contrast  Result Date: 07/11/2021 CLINICAL DATA:  Fall, head trauma EXAM: CT HEAD WITHOUT CONTRAST TECHNIQUE: Contiguous axial images were obtained from the base of the skull through the vertex without intravenous contrast. COMPARISON:  CT head 01/12/2016 FINDINGS: Brain: No acute intracranial hemorrhage. No focal mass lesion. No CT evidence of acute infarction. No midline shift or mass effect. No hydrocephalus. Basilar cisterns are patent. There are periventricular and subcortical white matter hypodensities. Generalized cortical atrophy. Vascular: No hyperdense vessel or unexpected calcification. Skull: Normal. Negative for fracture or focal lesion. Sinuses/Orbits: Paranasal sinuses and mastoid air cells are clear. Orbits are clear. Other: None. IMPRESSION: 1. No intracranial trauma. 2. Advanced atrophy and white matter microvascular disease. Electronically Signed   By: Suzy Bouchard M.D.   On: 07/11/2021 14:38   DG Hips Bilat W or Wo Pelvis 3-4 Views  Result Date: 07/11/2021 CLINICAL DATA:  Bilateral hip pain since falling 6 days ago. EXAM: DG HIP (WITH OR WITHOUT PELVIS) 3-4V BILAT COMPARISON:  Radiographs 05/09/2015. FINDINGS: The bones appear demineralized. There is no evidence of acute fracture, dislocation or bone destruction. The hip joint spaces are preserved. There are mild degenerative changes in the lower lumbar spine  and sacroiliac joints. There are surgical clips in both groins and scattered vascular calcifications. IMPRESSION: No evidence of acute fracture, dislocation or bone destruction. Scattered vascular calcifications. Electronically Signed   By: Richardean Sale M.D.   On: 07/11/2021 13:56    Procedures Procedures   Medications Ordered in ED Medications  0.9 %  sodium chloride infusion ( Intravenous New Bag/Given 07/11/21 1513)  fentaNYL (SUBLIMAZE) injection 50 mcg (has no administration in time range)  cefTRIAXone (ROCEPHIN) 1 g in sodium chloride 0.9 % 100 mL IVPB (has no administration in time range)    ED Course  I have reviewed the triage vital signs and the nursing notes.  Pertinent labs & imaging results that were available during my care of the patient were reviewed by me and considered in my medical decision making (see chart for details).    MDM Rules/Calculators/A&P                          Overall this patient appears severely generally weak, he is covered  in bruising, he is most likely significantly dehydrated, labs, fluids, rehydrate, x-rays to make sure there has been no broken ribs hips or injury to the brain.  The patient is agreeable  White blood cell count is 13,900, CK is 897, renal function is preserved with a creatinine of 1.4, this is close to the patient's baseline according to prior labs.  There is a transaminitis, troponin is 56, INR is 1.3, urinalysis looks grossly infected and antibiotics have been given.  Due to the patients weakness and deconditioned state he will be admitted to the hospital, discussed with Dr. Nehemiah Settle who will admit  Final Clinical Impression(s) / ED Diagnoses Final diagnoses:  Fall  Renal insufficiency  Transaminitis  FTT (failure to thrive) in adult  Urinary tract infection associated with indwelling urethral catheter, initial encounter [facility])    Rx / DC Orders ED Discharge Orders     None        Noemi Chapel, MD 07/11/21  1517

## 2021-07-11 NOTE — Progress Notes (Signed)
Pharmacy Antibiotic Note  Caleb [name] is a 87 y.o. male admitted on 07/11/2021 with UTI.  Pharmacy has been consulted for fluconazole dosing.   Plan: Fluconazole 100mg  daily  (adjusted based on renal dysfunction)  Height: 5\' 10"  (177.8 cm) Weight: 115.7 kg (255 lb 1.2 oz) IBW/kg (Calculated) : 73  Temp (24hrs), Avg:97.9 F (36.6 C), Min:97.9 F (36.6 C), Max:97.9 F (36.6 C)  Recent Labs  Lab 07/11/21 1316  WBC 13.9*  CREATININE 1.39*    Estimated Creatinine Clearance: 41.4 mL/min (A) (by C-G formula based on SCr of 1.39 mg/dL (H)).    No Known Allergies  Antimicrobials this admission: ceftriaxone 1g x1 (7/15)  Dose adjustments this admission: N/a  Microbiology results: 7/15 UCx: pending   Thank you for allowing pharmacy to be a part of this patient's care.  Jordan Hawks [place] 07/11/2021 5:59 PM

## 2021-07-11 NOTE — ED Triage Notes (Addendum)
Pt to the ED [facility] after a fall at home on Saturday 07/05/21.  The pt was found by the postman after having been lying on the floor since his fall. Pt was able to drink water from the toilet for hydration.  Chronic foley catheter in place. Pt denies hitting his head but is taking blood thinners.  Denies pain.Pt has generalized bruising including his back.

## 2021-07-11 NOTE — H&P (Signed)
History and Physical  Caleb [name]:[id number] DOB: Sep 23, 1925 DOA: 07/11/2021  Referring physician: Dr Sabra Heck, ED physician PCP: Sharilyn Sites, MD  Outpatient Specialists:   Patient Coming From: home  Chief Complaint: fall at home 6 days ago  HPI: Caleb [name] is a 87 y.o. male with a history of coronary artery disease, hyperlipidemia, obesity, history of penile cancer with neurogenic bladder and need for self-catheterization.  Patient had a fall on the floor 6 days ago in the bathroom.  He was unable to get up and has been on the floor since that time.  The Tour manager finally realized that he was not picking up his mail, and called the sheriff for a welfare check, and the patient was found.  Patient has sores on his body from being on the ground.  He has generalized body aches.  No palliating or provoking factors.  Denies fevers, chills, nausea, vomiting.  Emergency Department Course: Creatinine 1.39, which appears to be his baseline.  Creatinine kinase is 897.  Troponin stable at 56 and 60.  White blood cell count 13.9.  UA shows moderate leukocytes with many bacteria and budding yeast.  Patient was given a dose of Rocephin and send urine off for culture.  Review of Systems:   Pt denies any fevers, chills, nausea, vomiting, diarrhea, constipation, abdominal pain, shortness of breath, dyspnea on exertion, orthopnea, cough, wheezing, palpitations, headache, vision changes, lightheadedness, dizziness, melena, rectal bleeding.  Review of systems are otherwise negative  Past Medical History:  Diagnosis Date   Coronary artery disease    Hyperlipidemia    Hyperlipidemia    Obesity    Penile cancer (HCC)    Swelling of extremity    Left Leg   Past Surgical History:  Procedure Laterality Date   APPENDECTOMY     BACK SURGERY     CHOLECYSTECTOMY     CORONARY ANGIOPLASTY  11/2005   RCA PCI AND STENTING WITH A CYPHER DES   CORONARY ARTERY BYPASS GRAFT     HIATAL HERNIA REPAIR      lymph node removal     NM MYOCAR MULTIPLE W/SPECT  11/26/2009   EF 62%. NORMAL MYOCARDIAL PERFUSION STUDY.   TRANSTHORACIC ECHOCARDIOGRAM  12/22/2005   MILD TO MOD AORTIC SCLEROSIS W/O STENOSIS. MILD TO MODERATE MITRAL CALCIFICATION. LA- MILDLY DILATED.   Social History:  reports that he quit smoking about 38 years ago. His smoking use included cigarettes. He has never used smokeless tobacco. He reports that he does not drink alcohol and does not use drugs. Patient lives at home  No Known Allergies  Family History  Problem Relation Age of Onset   Cancer Father    Cancer Sister    Cancer Brother    Cancer Sister    Cancer Other       Prior to Admission medications   Medication Sig Start Date End Date Taking? Authorizing Provider  acetaminophen (TYLENOL) 325 MG tablet Take 1-2 tablets (325-650 mg total) by mouth every 6 (six) hours as needed for fever, headache, mild pain or moderate pain. 01/14/16  Yes Nat Christen, PA-C  Ascorbic Acid (VITAMIN C) 1000 MG tablet Take 1,000 mg by mouth daily.    Yes [provider]  aspirin EC 81 MG tablet Take 81 mg by mouth daily.   Yes [provider]  Calcium Carbonate-Vitamin D (CALCIUM-D PO) Take 1 tablet by mouth daily.   Yes [provider]  clopidogrel (PLAVIX) 75 MG tablet TAKE 1 TABLET  EVERY DAY 03/12/21  Yes Lorretta Harp, MD  metoprolol tartrate (LOPRESSOR) 25 MG tablet TAKE 1 TABLET TWICE DAILY (NEED MD APPOINTMENT) Patient taking differently: Take 25 mg by mouth 2 (two) times daily. 03/06/21  Yes Lorretta Harp, MD  Multiple Vitamin (MULTIVITAMIN WITH MINERALS) TABS tablet Take 1 tablet by mouth daily. Centrum Silver   Yes [provider]  Multiple Vitamins-Minerals (PRESERVISION AREDS 2) CAPS Take by mouth.   Yes [provider]  polyethylene glycol powder (GLYCOLAX/MIRALAX) powder Take 17 g by mouth daily as needed for mild constipation or moderate constipation.   Yes [provider]  simvastatin (ZOCOR) 20 MG tablet TAKE 1 TABLET AT BEDTIME 09/12/20  Yes Lorretta Harp, MD  cefpodoxime (VANTIN) 200 MG tablet Take 1 tablet (200 mg total) by mouth 2 (two) times daily. Patient not taking: No sig reported 05/17/20   Cleon Gustin, MD  docusate sodium (COLACE) 100 MG capsule Take 1 capsule (100 mg total) by mouth every 12 (twelve) hours. Patient not taking: No sig reported 01/07/18   Elnora Morrison, MD  doxycycline (VIBRAMYCIN) 100 MG capsule  09/23/20   [provider]  furosemide (LASIX) 20 MG tablet  09/23/20   [provider]  magnesium hydroxide (MILK OF MAGNESIA) 400 MG/5ML suspension Take 30 mLs by mouth daily as needed for mild constipation. Patient not taking: Reported on 07/11/2021    [provider]  nitrofurantoin, macrocrystal-monohydrate, (MACROBID) 100 MG capsule Take 1 capsule (100 mg total) by mouth every 12 (twelve) hours. Patient not taking: No sig reported 09/03/20   Cleon Gustin, MD    Physical Exam: BP 130/66   Pulse 81   Temp 97.9 F (36.6 C) (Oral)   Resp 18   Ht 5\' 10"  (1.778 m)   Wt 115.7 kg   SpO2 95%   BMI 36.60 kg/m   General: Elderly male. Awake and alert and oriented x3. No acute cardiopulmonary distress.  HEENT: Normocephalic atraumatic.  Right and left ears normal in appearance.  Pupils equal, round, reactive to light. Extraocular muscles are intact. Sclerae anicteric and noninjected.  Moist mucosal membranes. No mucosal lesions.  Neck: Neck supple without lymphadenopathy. No carotid bruits. No masses palpated.  Cardiovascular: Regular rate with normal S1-S2 sounds. No murmurs, rubs, gallops auscultated. No JVD.  Respiratory: Good respiratory effort with no wheezes, rales, rhonchi. Lungs clear to auscultation bilaterally.  No accessory muscle use. Abdomen: Soft, nontender, nondistended. Active bowel sounds. No masses or hepatosplenomegaly  Skin: Bruises and sores diffusely throughout  his body.  Sores are grade 1.  These are mostly concentrated on the back of his legs, feet.   Musculoskeletal: No calf or leg pain. All major joints not erythematous nontender.  No upper or lower joint deformation.  Good ROM.  No contractures  Psychiatric: Intact judgment and insight. Pleasant and cooperative. Neurologic: Patient has globally weak.  Cranial nerves II through XII are grossly intact.           Labs on Admission: I have personally reviewed following labs and imaging studies  CBC: Recent Labs  Lab 07/11/21 1316  WBC 13.9*  NEUTROABS 11.9*  HGB 11.8*  HCT 35.7*  MCV 98.1  PLT 323   Basic Metabolic Panel: Recent Labs  Lab 07/11/21 1316  NA 137  K 4.6  CL 105  CO2 24  GLUCOSE 133*  BUN 63*  CREATININE 1.39*  CALCIUM 9.2  MG 2.4   GFR: Estimated Creatinine Clearance: 41.4 mL/min (  A) (by C-G formula based on SCr of 1.39 mg/dL (H)). Liver Function Tests: Recent Labs  Lab 07/11/21 1316  AST 91*  ALT 80*  ALKPHOS 59  BILITOT 1.6*  PROT 6.3*  ALBUMIN 3.4*   No results for input(s): LIPASE, AMYLASE in the last 168 hours. No results for input(s): AMMONIA in the last 168 hours. Coagulation Profile: Recent Labs  Lab 07/11/21 1316  INR 1.3*   Cardiac Enzymes: Recent Labs  Lab 07/11/21 1316  CKTOTAL 897*   BNP (last 3 results) No results for input(s): PROBNP in the last 8760 hours. HbA1C: No results for input(s): HGBA1C in the last 72 hours. CBG: No results for input(s): GLUCAP in the last 168 hours. Lipid Profile: No results for input(s): CHOL, HDL, LDLCALC, TRIG, CHOLHDL, LDLDIRECT in the last 72 hours. Thyroid Function Tests: No results for input(s): TSH, T4TOTAL, FREET4, T3FREE, THYROIDAB in the last 72 hours. Anemia Panel: No results for input(s): VITAMINB12, FOLATE, FERRITIN, TIBC, IRON, RETICCTPCT in the last 72 hours. Urine analysis:    Component Value Date/Time   COLORURINE YELLOW 07/11/2021 1259   APPEARANCEUR CLOUDY (A) 07/11/2021  1259   LABSPEC 1.019 07/11/2021 1259   PHURINE 7.0 07/11/2021 1259   GLUCOSEU NEGATIVE 07/11/2021 1259   HGBUR SMALL (A) 07/11/2021 1259   BILIRUBINUR NEGATIVE 07/11/2021 1259   BILIRUBINUR negative 03/25/2020 1156   KETONESUR 20 (A) 07/11/2021 1259   PROTEINUR 30 (A) 07/11/2021 1259   UROBILINOGEN 0.2 03/25/2020 1156   NITRITE NEGATIVE 07/11/2021 1259   LEUKOCYTESUR MODERATE (A) 07/11/2021 1259   Sepsis Labs: @LABRCNTIP (procalcitonin:4,lacticidven:4) )No results found for this or any previous visit (from the past 240 hour(s)).   Radiological Exams on Admission: DG Chest 1 View  Result Date: 07/11/2021 CLINICAL DATA:  Fall 6 days ago.  Chest and bilateral hip pain. EXAM: CHEST  1 VIEW COMPARISON:  Radiographs 01/14/2016 and 01/13/2016.  CT 12/20/2005. FINDINGS: 1317 hours. The heart size and mediastinal contours are stable with aortic atherosclerosis. The lungs appear clear. There is no pleural effusion or pneumothorax. There are old left-sided rib fractures and sequela of prior left AC joint injury with heterotopic ossification. No acute osseous findings evident. Telemetry leads overlie the chest. IMPRESSION: No acute cardiopulmonary process. Sequela of previous upper left chest trauma. Electronically Signed   By: Richardean Sale M.D.   On: 07/11/2021 13:54   CT Head Wo Contrast  Result Date: 07/11/2021 CLINICAL DATA:  Fall, head trauma EXAM: CT HEAD WITHOUT CONTRAST TECHNIQUE: Contiguous axial images were obtained from the base of the skull through the vertex without intravenous contrast. COMPARISON:  CT head 01/12/2016 FINDINGS: Brain: No acute intracranial hemorrhage. No focal mass lesion. No CT evidence of acute infarction. No midline shift or mass effect. No hydrocephalus. Basilar cisterns are patent. There are periventricular and subcortical white matter hypodensities. Generalized cortical atrophy. Vascular: No hyperdense vessel or unexpected calcification. Skull: Normal. Negative for  fracture or focal lesion. Sinuses/Orbits: Paranasal sinuses and mastoid air cells are clear. Orbits are clear. Other: None. IMPRESSION: 1. No intracranial trauma. 2. Advanced atrophy and white matter microvascular disease. Electronically Signed   By: Suzy Bouchard M.D.   On: 07/11/2021 14:38   DG Hips Bilat W or Wo Pelvis 3-4 Views  Result Date: 07/11/2021 CLINICAL DATA:  Bilateral hip pain since falling 6 days ago. EXAM: DG HIP (WITH OR WITHOUT PELVIS) 3-4V BILAT COMPARISON:  Radiographs 05/09/2015. FINDINGS: The bones appear demineralized. There is no evidence of acute fracture, dislocation or bone destruction. The  hip joint spaces are preserved. There are mild degenerative changes in the lower lumbar spine and sacroiliac joints. There are surgical clips in both groins and scattered vascular calcifications. IMPRESSION: No evidence of acute fracture, dislocation or bone destruction. Scattered vascular calcifications. Electronically Signed   By: Richardean Sale M.D.   On: 07/11/2021 13:56      Assessment/Plan: Active Problems:   Coronary artery disease   Neurogenic bladder   History of penile cancer   Rhabdomyolysis   Fall at home, initial encounter   Acute lower UTI    This patient was discussed with the ED physician, including pertinent vitals, physical exam findings, labs, and imaging.  We also discussed care given by the ED provider.  Fall at home  PT to consult.  May need SNF or rehab Rhabdomyolysis IV fluids at 200 mL/h.  No history of CHF.  Repeat CK in the morning.  Also repeat CMP as LFTs slightly elevated UTI Repeat CBC in the morning Urine culture pending Continue Rocephin History of penile cancer and neurogenic bladder with self cath at home Will need catheter replaced Coronary artery disease  DVT prophylaxis: Lovenox Consultants: None Code Status: Full code Family Communication: None Disposition Plan: We will likely need SNF   Truett Mainland, DO

## 2021-07-12 ENCOUNTER — Inpatient Hospital Stay (HOSPITAL_COMMUNITY): Payer: Medicare Other

## 2021-07-12 ENCOUNTER — Other Ambulatory Visit (HOSPITAL_COMMUNITY): Payer: Self-pay | Admitting: Family Medicine

## 2021-07-12 DIAGNOSIS — Z7902 Long term (current) use of antithrombotics/antiplatelets: Secondary | ICD-10-CM | POA: Diagnosis not present

## 2021-07-12 DIAGNOSIS — H353211 Exudative age-related macular degeneration, right eye, with active choroidal neovascularization: Secondary | ICD-10-CM | POA: Diagnosis present

## 2021-07-12 DIAGNOSIS — R5381 Other malaise: Secondary | ICD-10-CM | POA: Diagnosis not present

## 2021-07-12 DIAGNOSIS — E785 Hyperlipidemia, unspecified: Secondary | ICD-10-CM | POA: Diagnosis present

## 2021-07-12 DIAGNOSIS — Z7401 Bed confinement status: Secondary | ICD-10-CM | POA: Diagnosis not present

## 2021-07-12 DIAGNOSIS — E669 Obesity, unspecified: Secondary | ICD-10-CM | POA: Diagnosis present

## 2021-07-12 DIAGNOSIS — Y846 Urinary catheterization as the cause of abnormal reaction of the patient, or of later complication, without mention of misadventure at the time of the procedure: Secondary | ICD-10-CM | POA: Diagnosis present

## 2021-07-12 DIAGNOSIS — J069 Acute upper respiratory infection, unspecified: Secondary | ICD-10-CM | POA: Diagnosis not present

## 2021-07-12 DIAGNOSIS — Y92009 Unspecified place in unspecified non-institutional (private) residence as the place of occurrence of the external cause: Secondary | ICD-10-CM | POA: Diagnosis not present

## 2021-07-12 DIAGNOSIS — R627 Adult failure to thrive: Secondary | ICD-10-CM | POA: Diagnosis present

## 2021-07-12 DIAGNOSIS — N390 Urinary tract infection, site not specified: Secondary | ICD-10-CM

## 2021-07-12 DIAGNOSIS — Z741 Need for assistance with personal care: Secondary | ICD-10-CM | POA: Diagnosis not present

## 2021-07-12 DIAGNOSIS — Z87891 Personal history of nicotine dependence: Secondary | ICD-10-CM | POA: Diagnosis not present

## 2021-07-12 DIAGNOSIS — N289 Disorder of kidney and ureter, unspecified: Secondary | ICD-10-CM | POA: Diagnosis present

## 2021-07-12 DIAGNOSIS — R41841 Cognitive communication deficit: Secondary | ICD-10-CM | POA: Diagnosis not present

## 2021-07-12 DIAGNOSIS — W19XXXA Unspecified fall, initial encounter: Secondary | ICD-10-CM | POA: Diagnosis not present

## 2021-07-12 DIAGNOSIS — I2510 Atherosclerotic heart disease of native coronary artery without angina pectoris: Secondary | ICD-10-CM | POA: Diagnosis present

## 2021-07-12 DIAGNOSIS — R279 Unspecified lack of coordination: Secondary | ICD-10-CM | POA: Diagnosis not present

## 2021-07-12 DIAGNOSIS — R69 Illness, unspecified: Secondary | ICD-10-CM | POA: Diagnosis not present

## 2021-07-12 DIAGNOSIS — Z8549 Personal history of malignant neoplasm of other male genital organs: Secondary | ICD-10-CM | POA: Diagnosis not present

## 2021-07-12 DIAGNOSIS — Z955 Presence of coronary angioplasty implant and graft: Secondary | ICD-10-CM | POA: Diagnosis not present

## 2021-07-12 DIAGNOSIS — Z9049 Acquired absence of other specified parts of digestive tract: Secondary | ICD-10-CM | POA: Diagnosis not present

## 2021-07-12 DIAGNOSIS — I480 Paroxysmal atrial fibrillation: Secondary | ICD-10-CM

## 2021-07-12 DIAGNOSIS — M6282 Rhabdomyolysis: Secondary | ICD-10-CM | POA: Diagnosis present

## 2021-07-12 DIAGNOSIS — R21 Rash and other nonspecific skin eruption: Secondary | ICD-10-CM | POA: Diagnosis present

## 2021-07-12 DIAGNOSIS — W1830XA Fall on same level, unspecified, initial encounter: Secondary | ICD-10-CM | POA: Diagnosis present

## 2021-07-12 DIAGNOSIS — T83511A Infection and inflammatory reaction due to indwelling urethral catheter, initial encounter: Secondary | ICD-10-CM | POA: Diagnosis present

## 2021-07-12 DIAGNOSIS — Z6836 Body mass index (BMI) 36.0-36.9, adult: Secondary | ICD-10-CM | POA: Diagnosis not present

## 2021-07-12 DIAGNOSIS — N319 Neuromuscular dysfunction of bladder, unspecified: Secondary | ICD-10-CM | POA: Diagnosis present

## 2021-07-12 DIAGNOSIS — R1311 Dysphagia, oral phase: Secondary | ICD-10-CM | POA: Diagnosis not present

## 2021-07-12 DIAGNOSIS — Z7982 Long term (current) use of aspirin: Secondary | ICD-10-CM | POA: Diagnosis not present

## 2021-07-12 DIAGNOSIS — Z20822 Contact with and (suspected) exposure to covid-19: Secondary | ICD-10-CM | POA: Diagnosis present

## 2021-07-12 DIAGNOSIS — R2681 Unsteadiness on feet: Secondary | ICD-10-CM | POA: Diagnosis not present

## 2021-07-12 DIAGNOSIS — Z79899 Other long term (current) drug therapy: Secondary | ICD-10-CM | POA: Diagnosis not present

## 2021-07-12 DIAGNOSIS — Z20818 Contact with and (suspected) exposure to other bacterial communicable diseases: Secondary | ICD-10-CM | POA: Diagnosis not present

## 2021-07-12 LAB — URINE CULTURE

## 2021-07-12 LAB — COMPREHENSIVE METABOLIC PANEL
ALT: 63 U/L — ABNORMAL HIGH (ref 0–44)
AST: 67 U/L — ABNORMAL HIGH (ref 15–41)
Albumin: 2.9 g/dL — ABNORMAL LOW (ref 3.5–5.0)
Alkaline Phosphatase: 49 U/L (ref 38–126)
Anion gap: 5 (ref 5–15)
BUN: 54 mg/dL — ABNORMAL HIGH (ref 8–23)
CO2: 24 mmol/L (ref 22–32)
Calcium: 8.5 mg/dL — ABNORMAL LOW (ref 8.9–10.3)
Chloride: 107 mmol/L (ref 98–111)
Creatinine, Ser: 1.32 mg/dL — ABNORMAL HIGH (ref 0.61–1.24)
GFR, Estimated: 50 mL/min — ABNORMAL LOW (ref >60)
Glucose, Bld: 129 mg/dL — ABNORMAL HIGH (ref 70–99)
Potassium: 4 mmol/L (ref 3.5–5.1)
Sodium: 136 mmol/L (ref 135–145)
Total Bilirubin: 0.8 mg/dL (ref 0.3–1.2)
Total Protein: 5.7 g/dL — ABNORMAL LOW (ref 6.5–8.1)

## 2021-07-12 LAB — CBC
HCT: 32.9 % — ABNORMAL LOW (ref 39.0–52.0)
Hemoglobin: 11 g/dL — ABNORMAL LOW (ref 13.0–17.0)
MCH: 32.7 pg (ref 26.0–34.0)
MCHC: 33.4 g/dL (ref 30.0–36.0)
MCV: 97.9 fL (ref 80.0–100.0)
Platelets: 204 10*3/uL (ref 150–400)
RBC: 3.36 MIL/uL — ABNORMAL LOW (ref 4.22–5.81)
RDW: 15 % (ref 11.5–15.5)
WBC: 9.6 10*3/uL (ref 4.0–10.5)
nRBC: 0.3 % — ABNORMAL HIGH (ref 0.0–0.2)

## 2021-07-12 LAB — CK: Total CK: 653 U/L — ABNORMAL HIGH (ref 49–397)

## 2021-07-12 LAB — ECHOCARDIOGRAM COMPLETE
AR max vel: 1.26 cm2
AV Area VTI: 1.35 cm2
AV Area mean vel: 1.29 cm2
AV Mean grad: 14.8 mmHg
AV Peak grad: 28.6 mmHg
Ao pk vel: 2.68 m/s
Area-P 1/2: 2.81 cm2
Height: 70 in
S' Lateral: 2.98 cm
Weight: 3791.91 oz

## 2021-07-12 LAB — TSH: TSH: 1.278 u[IU]/mL (ref 0.350–4.500)

## 2021-07-12 MED ORDER — SODIUM CHLORIDE 0.9 % IV SOLN
1.0000 g | INTRAVENOUS | Status: DC
  Administered 2021-07-12 – 2021-07-15 (×4): 1 g via INTRAVENOUS
  Filled 2021-07-12 (×4): qty 10

## 2021-07-12 MED ORDER — CHLORHEXIDINE GLUCONATE CLOTH 2 % EX PADS
6.0000 | MEDICATED_PAD | Freq: Every day | CUTANEOUS | Status: DC
  Administered 2021-07-12 – 2021-07-15 (×4): 6 via TOPICAL

## 2021-07-12 MED ORDER — METOPROLOL TARTRATE 25 MG PO TABS
25.0000 mg | ORAL_TABLET | Freq: Two times a day (BID) | ORAL | Status: DC
  Administered 2021-07-12 – 2021-07-14 (×4): 25 mg via ORAL
  Filled 2021-07-12 (×5): qty 1

## 2021-07-12 MED ORDER — METOPROLOL TARTRATE 5 MG/5ML IV SOLN
5.0000 mg | Freq: Once | INTRAVENOUS | Status: AC
  Administered 2021-07-12: 5 mg via INTRAVENOUS
  Filled 2021-07-12: qty 5

## 2021-07-12 MED ORDER — METOPROLOL TARTRATE 5 MG/5ML IV SOLN
5.0000 mg | Freq: Four times a day (QID) | INTRAVENOUS | Status: DC | PRN
  Administered 2021-07-13: 5 mg via INTRAVENOUS
  Filled 2021-07-12: qty 5

## 2021-07-12 NOTE — Progress Notes (Signed)
Tele called patient in RVR in the 140s. MD Manuella Ghazi made aware.

## 2021-07-12 NOTE — Progress Notes (Signed)
PROGRESS NOTE    Caleb [name]  FTD:[id number] DOB: [birth year] DOA: 07/11/2021 PCP: Sharilyn Sites, MD   Brief Narrative:   Caleb [name] is a 87 y.o. male with a history of coronary artery disease, hyperlipidemia, obesity, history of penile cancer with neurogenic bladder and need for self-catheterization.  Patient had a fall on the floor 6 days ago in the bathroom.  Patient was admitted with fall at home with rhabdomyolysis as well as UTI.  He is noted to have some atrial fibrillation with RVR noted this morning which may be new onset.  He will require further evaluation with 2D echocardiogram as well as TSH and careful monitoring.  Assessment & Plan:   Active Problems:   Coronary artery disease   Neurogenic bladder   History of penile cancer   Rhabdomyolysis   Fall at home, initial encounter   Acute lower UTI   Fall at home with associated rhabdomyolysis -Continue aggressive IV fluid -Continue to follow CK levels which are downtrending -PT evaluation pending -Fall precautions and likely need for SNF/rehab  Atrial fibrillation with RVR -Appears to be on metoprolol at home which will be resumed -IV metoprolol given x1 dose with good result -Continue to monitor on telemetry -Does not appear to be a good candidate for anticoagulation due to falls -TSH and 2D echocardiogram pending  UTI -Continue Rocephin -Urine culture pending  History of penile cancer neurogenic bladder with self-catheterization at home -Continue to monitor -Replace catheter  CAD -Check 2D echo -No chest pain noted   DVT prophylaxis:Lovenox Code Status: Full Family Communication: None at bedside Disposition Plan:  Status is: Observation  The patient will require care spanning > 2 midnights and should be moved to inpatient because: Unsafe d/c plan and IV treatments appropriate due to intensity of illness or inability to take PO  Dispo: The patient is from: Home              Anticipated d/c is  to: SNF              Patient currently is not medically stable to d/c.   Difficult to place patient No  Consultants:  None  Procedures:  See below  Antimicrobials:  Anti-infectives (From admission, onward)    Start     Dose/Rate Route Frequency Ordered Stop   07/12/21 1000  cefTRIAXone (ROCEPHIN) 1 g in sodium chloride 0.9 % 100 mL IVPB        1 g 200 mL/hr over 30 Minutes Intravenous Every 24 hours 07/12/21 0830     07/11/21 1800  fluconazole (DIFLUCAN) IVPB 100 mg        100 mg 50 mL/hr over 60 Minutes Intravenous Every 24 hours 07/11/21 1758     07/11/21 1515  cefTRIAXone (ROCEPHIN) 1 g in sodium chloride 0.9 % 100 mL IVPB        1 g 200 mL/hr over 30 Minutes Intravenous  Once 07/11/21 1507 07/11/21 1730       Subjective: Patient seen and evaluated today with no new acute complaints or concerns. No acute concerns or events noted overnight.  He denies any chest pain or shortness of breath or palpitations.  He is noted to be tachycardic on telemetry.  Objective: Vitals:   07/12/21 0153 07/12/21 0534 07/12/21 0800 07/12/21 0940  BP: 95/71 96/64 91/67  107/64  Pulse: (!) 124 (!) 113 (!) 120 (!) 135  Resp: 18 18 20    Temp: 97.8 F (36.6 C) 97.7 F (36.5 C) 98.1 F (  36.7 C)   TempSrc: Oral  Oral   SpO2: 95% 94% 96%   Weight:      Height:        Intake/Output Summary (Last 24 hours) at 07/12/2021 1019 Last data filed at 07/12/2021 1700 Gross per 24 hour  Intake 2153.26 ml  Output 800 ml  Net 1353.26 ml   Filed Weights   07/11/21 1250 07/11/21 2000  Weight: 115.7 kg 107.5 kg    Examination:  General exam: Appears calm and comfortable  Respiratory system: Clear to auscultation. Respiratory effort normal. Cardiovascular system: S1 & S2 heard, irregular and tachycardic Gastrointestinal system: Abdomen is soft Central nervous system: Alert and awake Extremities: No edema Skin: No significant lesions noted Psychiatry: Flat affect. Foley with clear, yellow urine  output    Data Reviewed: I have personally reviewed following labs and imaging studies  CBC: Recent Labs  Lab 07/11/21 1316 07/12/21 0412  WBC 13.9* 9.6  NEUTROABS 11.9*  --   HGB 11.8* 11.0*  HCT 35.7* 32.9*  MCV 98.1 97.9  PLT 218 174   Basic Metabolic Panel: Recent Labs  Lab 07/11/21 1316 07/12/21 0412  NA 137 136  K 4.6 4.0  CL 105 107  CO2 24 24  GLUCOSE 133* 129*  BUN 63* 54*  CREATININE 1.39* 1.32*  CALCIUM 9.2 8.5*  MG 2.4  --    GFR: Estimated Creatinine Clearance: 42 mL/min (A) (by C-G formula based on SCr of 1.32 mg/dL (H)). Liver Function Tests: Recent Labs  Lab 07/11/21 1316 07/12/21 0412  AST 91* 67*  ALT 80* 63*  ALKPHOS 59 49  BILITOT 1.6* 0.8  PROT 6.3* 5.7*  ALBUMIN 3.4* 2.9*   No results for input(s): LIPASE, AMYLASE in the last 168 hours. No results for input(s): AMMONIA in the last 168 hours. Coagulation Profile: Recent Labs  Lab 07/11/21 1316  INR 1.3*   Cardiac Enzymes: Recent Labs  Lab 07/11/21 1316 07/12/21 0412  CKTOTAL 897* 653*   BNP (last 3 results) No results for input(s): PROBNP in the last 8760 hours. HbA1C: No results for input(s): HGBA1C in the last 72 hours. CBG: No results for input(s): GLUCAP in the last 168 hours. Lipid Profile: No results for input(s): CHOL, HDL, LDLCALC, TRIG, CHOLHDL, LDLDIRECT in the last 72 hours. Thyroid Function Tests: No results for input(s): TSH, T4TOTAL, FREET4, T3FREE, THYROIDAB in the last 72 hours. Anemia Panel: No results for input(s): VITAMINB12, FOLATE, FERRITIN, TIBC, IRON, RETICCTPCT in the last 72 hours. Sepsis Labs: No results for input(s): PROCALCITON, LATICACIDVEN in the last 168 hours.  Recent Results (from the past 240 hour(s))  Resp Panel by RT-PCR (Flu A&B, Covid) Nasopharyngeal Swab     Status: None   Collection Time: 07/11/21  5:47 PM   Specimen: Nasopharyngeal Swab; Nasopharyngeal(NP) swabs in vial transport medium  Result Value Ref Range Status   SARS  Coronavirus 2 by RT PCR NEGATIVE NEGATIVE Final    Comment: (NOTE) SARS-CoV-2 target nucleic acids are NOT DETECTED.  The SARS-CoV-2 RNA is generally detectable in upper respiratory specimens during the acute phase of infection. The lowest concentration of SARS-CoV-2 viral copies this assay can detect is 138 copies/mL. A negative result does not preclude SARS-Cov-2 infection and should not be used as the sole basis for treatment or other patient management decisions. A negative result may occur with  improper specimen collection/handling, submission of specimen other than nasopharyngeal swab, presence of viral mutation(s) within the areas targeted by this assay, and inadequate number  of viral copies(<138 copies/mL). A negative result must be combined with clinical observations, patient history, and epidemiological information. The expected result is Negative.  Fact Sheet for Patients:  EntrepreneurPulse.com.au  Fact Sheet for Healthcare Providers:  IncredibleEmployment.be  This test is no t yet approved or cleared by the [place] FDA and  has been authorized for detection and/or diagnosis of SARS-CoV-2 by FDA under an Emergency Use Authorization (EUA). This EUA will remain  in effect (meaning this test can be used) for the duration of the COVID-19 declaration under Section 564(b)(1) of the Act, 21 U.S.C.section 360bbb-3(b)(1), unless the authorization is terminated  or revoked sooner.       Influenza A by PCR NEGATIVE NEGATIVE Final   Influenza B by PCR NEGATIVE NEGATIVE Final    Comment: (NOTE) The Xpert Xpress SARS-CoV-2/FLU/RSV plus assay is intended as an aid in the diagnosis of influenza from Nasopharyngeal swab specimens and should not be used as a sole basis for treatment. Nasal washings and aspirates are unacceptable for Xpert Xpress SARS-CoV-2/FLU/RSV testing.  Fact Sheet for  Patients: EntrepreneurPulse.com.au  Fact Sheet for Healthcare Providers: IncredibleEmployment.be  This test is not yet approved or cleared by the [place] FDA and has been authorized for detection and/or diagnosis of SARS-CoV-2 by FDA under an Emergency Use Authorization (EUA). This EUA will remain in effect (meaning this test can be used) for the duration of the COVID-19 declaration under Section 564(b)(1) of the Act, 21 U.S.C. section 360bbb-3(b)(1), unless the authorization is terminated or revoked.  Performed at [facility], [address] [place], [place] [address]          Radiology Studies: DG Chest 1 View  Result Date: 07/11/2021 CLINICAL DATA:  Fall 6 days ago.  Chest and bilateral hip pain. EXAM: CHEST  1 VIEW COMPARISON:  Radiographs 01/14/2016 and 01/13/2016.  CT 12/20/2005. FINDINGS: 1317 hours. The heart size and mediastinal contours are stable with aortic atherosclerosis. The lungs appear clear. There is no pleural effusion or pneumothorax. There are old left-sided rib fractures and sequela of prior left AC joint injury with heterotopic ossification. No acute osseous findings evident. Telemetry leads overlie the chest. IMPRESSION: No acute cardiopulmonary process. Sequela of previous upper left chest trauma. Electronically Signed   By: Richardean Sale M.D.   On: 07/11/2021 13:54   CT Head Wo Contrast  Result Date: 07/11/2021 CLINICAL DATA:  Fall, head trauma EXAM: CT HEAD WITHOUT CONTRAST TECHNIQUE: Contiguous axial images were obtained from the base of the skull through the vertex without intravenous contrast. COMPARISON:  CT head 01/12/2016 FINDINGS: Brain: No acute intracranial hemorrhage. No focal mass lesion. No CT evidence of acute infarction. No midline shift or mass effect. No hydrocephalus. Basilar cisterns are patent. There are periventricular and subcortical white matter hypodensities. Generalized cortical atrophy.  Vascular: No hyperdense vessel or unexpected calcification. Skull: Normal. Negative for fracture or focal lesion. Sinuses/Orbits: Paranasal sinuses and mastoid air cells are clear. Orbits are clear. Other: None. IMPRESSION: 1. No intracranial trauma. 2. Advanced atrophy and white matter microvascular disease. Electronically Signed   By: Suzy Bouchard M.D.   On: 07/11/2021 14:38   DG Hips Bilat W or Wo Pelvis 3-4 Views  Result Date: 07/11/2021 CLINICAL DATA:  Bilateral hip pain since falling 6 days ago. EXAM: DG HIP (WITH OR WITHOUT PELVIS) 3-4V BILAT COMPARISON:  Radiographs 05/09/2015. FINDINGS: The bones appear demineralized. There is no evidence of acute fracture, dislocation or bone destruction. The hip joint spaces are preserved. There are  mild degenerative changes in the lower lumbar spine and sacroiliac joints. There are surgical clips in both groins and scattered vascular calcifications. IMPRESSION: No evidence of acute fracture, dislocation or bone destruction. Scattered vascular calcifications. Electronically Signed   By: Richardean Sale M.D.   On: 07/11/2021 13:56        Scheduled Meds:  aspirin EC  81 mg Oral Daily   Chlorhexidine Gluconate Cloth  6 each Topical Daily   enoxaparin (LOVENOX) injection  40 mg Subcutaneous Q24H   metoprolol tartrate  25 mg Oral BID   Continuous Infusions:  sodium chloride 250 mL/hr at 07/12/21 9242   cefTRIAXone (ROCEPHIN)  IV 1 g (07/12/21 1016)   fluconazole (DIFLUCAN) IV 100 mg (07/11/21 2129)     LOS: 0 days    Time spent: 35 minutes    Stevee Valenta Darleen Crocker, DO [facility]  If 7PM-7AM, please contact night-coverage [email or web address] 07/12/2021, 10:19 AM

## 2021-07-12 NOTE — Progress Notes (Signed)
Informed MD Manuella Ghazi regarding new change received at report regarding patient being in Afib, HR in the 130s during the night. Currently 120. Patient alert and oriented, denies any pain, eating breakfast at this time. New orders received.

## 2021-07-12 NOTE — Progress Notes (Signed)
  Echocardiogram 2D Echocardiogram has been performed.  Randa Lynn Konnor Vondrasek 07/12/2021, 3:21 PM

## 2021-07-13 DIAGNOSIS — W19XXXA Unspecified fall, initial encounter: Secondary | ICD-10-CM

## 2021-07-13 DIAGNOSIS — Y92009 Unspecified place in unspecified non-institutional (private) residence as the place of occurrence of the external cause: Secondary | ICD-10-CM

## 2021-07-13 LAB — COMPREHENSIVE METABOLIC PANEL
ALT: 52 U/L — ABNORMAL HIGH (ref 0–44)
AST: 49 U/L — ABNORMAL HIGH (ref 15–41)
Albumin: 2.4 g/dL — ABNORMAL LOW (ref 3.5–5.0)
Alkaline Phosphatase: 40 U/L (ref 38–126)
Anion gap: 5 (ref 5–15)
BUN: 47 mg/dL — ABNORMAL HIGH (ref 8–23)
CO2: 21 mmol/L — ABNORMAL LOW (ref 22–32)
Calcium: 7.8 mg/dL — ABNORMAL LOW (ref 8.9–10.3)
Chloride: 110 mmol/L (ref 98–111)
Creatinine, Ser: 1.26 mg/dL — ABNORMAL HIGH (ref 0.61–1.24)
GFR, Estimated: 53 mL/min — ABNORMAL LOW (ref >60)
Glucose, Bld: 105 mg/dL — ABNORMAL HIGH (ref 70–99)
Potassium: 4.1 mmol/L (ref 3.5–5.1)
Sodium: 136 mmol/L (ref 135–145)
Total Bilirubin: 0.5 mg/dL (ref 0.3–1.2)
Total Protein: 4.9 g/dL — ABNORMAL LOW (ref 6.5–8.1)

## 2021-07-13 LAB — CBC
HCT: 29.4 % — ABNORMAL LOW (ref 39.0–52.0)
Hemoglobin: 9.3 g/dL — ABNORMAL LOW (ref 13.0–17.0)
MCH: 31.7 pg (ref 26.0–34.0)
MCHC: 31.6 g/dL (ref 30.0–36.0)
MCV: 100.3 fL — ABNORMAL HIGH (ref 80.0–100.0)
Platelets: 185 10*3/uL (ref 150–400)
RBC: 2.93 MIL/uL — ABNORMAL LOW (ref 4.22–5.81)
RDW: 15.1 % (ref 11.5–15.5)
WBC: 8 10*3/uL (ref 4.0–10.5)
nRBC: 0.4 % — ABNORMAL HIGH (ref 0.0–0.2)

## 2021-07-13 LAB — MAGNESIUM: Magnesium: 2.1 mg/dL (ref 1.7–2.4)

## 2021-07-13 LAB — CK: Total CK: 393 U/L (ref 49–397)

## 2021-07-13 MED ORDER — ACETAMINOPHEN 325 MG PO TABS
650.0000 mg | ORAL_TABLET | Freq: Four times a day (QID) | ORAL | Status: DC
  Administered 2021-07-13 – 2021-07-15 (×10): 650 mg via ORAL
  Filled 2021-07-13 (×10): qty 2

## 2021-07-13 NOTE — Progress Notes (Signed)
PT Cancellation Note  Patient Details Name: EDDY LISZEWSKI MRN: [id number] DOB: Aug 01, 1925   Cancelled Treatment:    Reason Eval/Treat Not Completed: Attempted to wake pt; he is very lethargic.  Unable to complete an accurate evaluation at this time. Fatigue/lethargy limiting ability to participate  Rayetta Humphrey, PT CLT [id number]  07/13/2021, 10:54 AM

## 2021-07-13 NOTE — Progress Notes (Signed)
PROGRESS NOTE    Caleb [name]  IWP:[id number] DOB: 1925-10-03 DOA: 07/11/2021 PCP: Sharilyn Sites, MD   Brief Narrative:   Caleb [name] is a 87 y.o. male with a history of coronary artery disease, hyperlipidemia, obesity, history of penile cancer with neurogenic bladder and need for self-catheterization.  Patient had a fall on the floor 6 days ago in the bathroom.  Patient was admitted with fall at home with rhabdomyolysis as well as UTI.  He is noted to have some atrial fibrillation with RVR noted this morning which may be new onset.  His heart rate is better controlled and 2D echocardiogram demonstrates LVEF 65-70% and TSH 1.278.  Assessment & Plan:   Active Problems:   Coronary artery disease   Neurogenic bladder   History of penile cancer   Rhabdomyolysis   Fall at home, initial encounter   Acute lower UTI   Fall at home with associated rhabdomyolysis-improving -Continue IV fluid, decreased rate today -Continue to follow CK levels which are downtrending -PT evaluation pending -Fall precautions and likely need for SNF/rehab   Atrial fibrillation with RVR-improved -Appears to be on metoprolol at home which will be resumed -IV metoprolol given x1 dose with good result -Continue to monitor on telemetry -Does not appear to be a good candidate for anticoagulation due to falls -TSH 1.278 -2D echocardiogram with LVEF 65-70%   UTI with bacteria and yeast -Continue Rocephin and Diflucan -Urine culture with multiple species, to be recollected -Patient will have Foley exchanged with urologist outpatient  Osteoarthritis -Plan to try scheduled Tylenol   History of penile cancer neurogenic bladder with self-catheterization at home -Continue to monitor -Replace catheter in outpatient setting as scheduled with urology   CAD -2D echocardiogram with LVEF 65-70% with no acute abnormalities -No chest pain noted     DVT prophylaxis:Lovenox Code Status: Full Family  Communication: Discussed with son on phone 7/17 Disposition Plan:  Status is: Inpatient  Remains inpatient appropriate because:Unsafe d/c plan, IV treatments appropriate due to intensity of illness or inability to take PO, and Inpatient level of care appropriate due to severity of illness  Dispo: The patient is from: Home              Anticipated d/c is to: SNF              Patient currently is not medically stable to d/c.   Difficult to place patient No    Consultants:  None   Procedures:  See below  Antimicrobials:  Anti-infectives (From admission, onward)    Start     Dose/Rate Route Frequency Ordered Stop   07/12/21 1000  cefTRIAXone (ROCEPHIN) 1 g in sodium chloride 0.9 % 100 mL IVPB        1 g 200 mL/hr over 30 Minutes Intravenous Every 24 hours 07/12/21 0830     07/11/21 1800  fluconazole (DIFLUCAN) IVPB 100 mg        100 mg 50 mL/hr over 60 Minutes Intravenous Every 24 hours 07/11/21 1758     07/11/21 1515  cefTRIAXone (ROCEPHIN) 1 g in sodium chloride 0.9 % 100 mL IVPB        1 g 200 mL/hr over 30 Minutes Intravenous  Once 07/11/21 1507 07/11/21 1730       Subjective: Patient seen and evaluated today with complaints of some stiffness and pain to his bilateral hands. No acute concerns or events noted overnight.  Objective: Vitals:   07/12/21 1240 07/12/21 1411 07/12/21 2122 07/13/21  0429  BP: 104/61 (!) 108/55 99/66 119/74  Pulse: (!) 107 (!) 103 82 90  Resp: 19 18 18 18   Temp: 98.7 F (37.1 C) 98.8 F (37.1 C) 97.6 F (36.4 C) 98 F (36.7 C)  TempSrc: Oral Oral    SpO2: 96% 95% 100% 94%  Weight:      Height:        Intake/Output Summary (Last 24 hours) at 07/13/2021 0850 Last data filed at 07/13/2021 0630 Gross per 24 hour  Intake 6090.74 ml  Output 750 ml  Net 5340.74 ml   Filed Weights   07/11/21 1250 07/11/21 2000  Weight: 115.7 kg 107.5 kg    Examination:  General exam: Appears calm and comfortable  Respiratory system: Clear to  auscultation. Respiratory effort normal. Cardiovascular system: S1 & S2 heard, RRR.  Gastrointestinal system: Abdomen is soft Central nervous system: Alert and awake Extremities: No edema Skin: No significant lesions noted Psychiatry: Flat affect. Foley with clear, yellow urine output    Data Reviewed: I have personally reviewed following labs and imaging studies  CBC: Recent Labs  Lab 07/11/21 1316 07/12/21 0412 07/13/21 0225  WBC 13.9* 9.6 8.0  NEUTROABS 11.9*  --   --   HGB 11.8* 11.0* 9.3*  HCT 35.7* 32.9* 29.4*  MCV 98.1 97.9 100.3*  PLT 218 204 502   Basic Metabolic Panel: Recent Labs  Lab 07/11/21 1316 07/12/21 0412 07/13/21 0225  NA 137 136 136  K 4.6 4.0 4.1  CL 105 107 110  CO2 24 24 21*  GLUCOSE 133* 129* 105*  BUN 63* 54* 47*  CREATININE 1.39* 1.32* 1.26*  CALCIUM 9.2 8.5* 7.8*  MG 2.4  --  2.1   GFR: Estimated Creatinine Clearance: 44 mL/min (A) (by C-G formula based on SCr of 1.26 mg/dL (H)). Liver Function Tests: Recent Labs  Lab 07/11/21 1316 07/12/21 0412 07/13/21 0225  AST 91* 67* 49*  ALT 80* 63* 52*  ALKPHOS 59 49 40  BILITOT 1.6* 0.8 0.5  PROT 6.3* 5.7* 4.9*  ALBUMIN 3.4* 2.9* 2.4*   No results for input(s): LIPASE, AMYLASE in the last 168 hours. No results for input(s): AMMONIA in the last 168 hours. Coagulation Profile: Recent Labs  Lab 07/11/21 1316  INR 1.3*   Cardiac Enzymes: Recent Labs  Lab 07/11/21 1316 07/12/21 0412 07/13/21 0225  CKTOTAL 897* 653* 393   BNP (last 3 results) No results for input(s): PROBNP in the last 8760 hours. HbA1C: No results for input(s): HGBA1C in the last 72 hours. CBG: No results for input(s): GLUCAP in the last 168 hours. Lipid Profile: No results for input(s): CHOL, HDL, LDLCALC, TRIG, CHOLHDL, LDLDIRECT in the last 72 hours. Thyroid Function Tests: Recent Labs    07/11/21 1610  TSH 1.278   Anemia Panel: No results for input(s): VITAMINB12, FOLATE, FERRITIN, TIBC, IRON,  RETICCTPCT in the last 72 hours. Sepsis Labs: No results for input(s): PROCALCITON, LATICACIDVEN in the last 168 hours.  Recent Results (from the past 240 hour(s))  Urine Culture     Status: Abnormal   Collection Time: 07/11/21 12:59 PM   Specimen: Urine, Catheterized  Result Value Ref Range Status   Specimen Description   Final    URINE, CATHETERIZED Performed at [facility], [address], [place], [place] [address]    Special Requests   Final    NONE Performed at [facility], [address], [place], [place] [address]    Culture MULTIPLE SPECIES PRESENT, SUGGEST RECOLLECTION (A)  Final   Report Status 07/12/2021 FINAL  Final  Resp Panel by RT-PCR (Flu A&B, Covid) Nasopharyngeal Swab     Status: None   Collection Time: 07/11/21  5:47 PM   Specimen: Nasopharyngeal Swab; Nasopharyngeal(NP) swabs in vial transport medium  Result Value Ref Range Status   SARS Coronavirus 2 by RT PCR NEGATIVE NEGATIVE Final    Comment: (NOTE) SARS-CoV-2 target nucleic acids are NOT DETECTED.  The SARS-CoV-2 RNA is generally detectable in upper respiratory specimens during the acute phase of infection. The lowest concentration of SARS-CoV-2 viral copies this assay can detect is 138 copies/mL. A negative result does not preclude SARS-Cov-2 infection and should not be used as the sole basis for treatment or other patient management decisions. A negative result may occur with  improper specimen collection/handling, submission of specimen other than nasopharyngeal swab, presence of viral mutation(s) within the areas targeted by this assay, and inadequate number of viral copies(<138 copies/mL). A negative result must be combined with clinical observations, patient history, and epidemiological information. The expected result is Negative.  Fact Sheet for Patients:  EntrepreneurPulse.com.au  Fact Sheet for Healthcare Providers:  IncredibleEmployment.be  This test  is no t yet approved or cleared by the [place] FDA and  has been authorized for detection and/or diagnosis of SARS-CoV-2 by FDA under an Emergency Use Authorization (EUA). This EUA will remain  in effect (meaning this test can be used) for the duration of the COVID-19 declaration under Section 564(b)(1) of the Act, 21 U.S.C.section 360bbb-3(b)(1), unless the authorization is terminated  or revoked sooner.       Influenza A by PCR NEGATIVE NEGATIVE Final   Influenza B by PCR NEGATIVE NEGATIVE Final    Comment: (NOTE) The Xpert Xpress SARS-CoV-2/FLU/RSV plus assay is intended as an aid in the diagnosis of influenza from Nasopharyngeal swab specimens and should not be used as a sole basis for treatment. Nasal washings and aspirates are unacceptable for Xpert Xpress SARS-CoV-2/FLU/RSV testing.  Fact Sheet for Patients: EntrepreneurPulse.com.au  Fact Sheet for Healthcare Providers: IncredibleEmployment.be  This test is not yet approved or cleared by the [place] FDA and has been authorized for detection and/or diagnosis of SARS-CoV-2 by FDA under an Emergency Use Authorization (EUA). This EUA will remain in effect (meaning this test can be used) for the duration of the COVID-19 declaration under Section 564(b)(1) of the Act, 21 U.S.C. section 360bbb-3(b)(1), unless the authorization is terminated or revoked.  Performed at [facility], [address] [place], [place] [address]          Radiology Studies: DG Chest 1 View  Result Date: 07/11/2021 CLINICAL DATA:  Fall 6 days ago.  Chest and bilateral hip pain. EXAM: CHEST  1 VIEW COMPARISON:  Radiographs 01/14/2016 and 01/13/2016.  CT 12/20/2005. FINDINGS: 1317 hours. The heart size and mediastinal contours are stable with aortic atherosclerosis. The lungs appear clear. There is no pleural effusion or pneumothorax. There are old left-sided rib fractures and sequela of prior left AC  joint injury with heterotopic ossification. No acute osseous findings evident. Telemetry leads overlie the chest. IMPRESSION: No acute cardiopulmonary process. Sequela of previous upper left chest trauma. Electronically Signed   By: Richardean Sale M.D.   On: 07/11/2021 13:54   CT Head Wo Contrast  Result Date: 07/11/2021 CLINICAL DATA:  Fall, head trauma EXAM: CT HEAD WITHOUT CONTRAST TECHNIQUE: Contiguous axial images were obtained from the base of the skull through the vertex without intravenous contrast. COMPARISON:  CT head  01/12/2016 FINDINGS: Brain: No acute intracranial hemorrhage. No focal mass lesion. No CT evidence of acute infarction. No midline shift or mass effect. No hydrocephalus. Basilar cisterns are patent. There are periventricular and subcortical white matter hypodensities. Generalized cortical atrophy. Vascular: No hyperdense vessel or unexpected calcification. Skull: Normal. Negative for fracture or focal lesion. Sinuses/Orbits: Paranasal sinuses and mastoid air cells are clear. Orbits are clear. Other: None. IMPRESSION: 1. No intracranial trauma. 2. Advanced atrophy and white matter microvascular disease. Electronically Signed   By: Suzy Bouchard M.D.   On: 07/11/2021 14:38   ECHOCARDIOGRAM COMPLETE  Result Date: 07/12/2021    ECHOCARDIOGRAM REPORT   Patient Name:   MAXIMUM REILAND Date of Exam: 07/12/2021 Medical Rec #:  [id number]       Height:       70.0 in Accession #:    [id number]      Weight:       237.0 lb Date of Birth:  17-Jun-1925        BSA:          2.243 m Patient Age:    70 years        BP:           107/64 mmHg Patient Gender: M               HR:           112 bpm. Exam Location:  [facility] Procedure: 2D Echo, Cardiac Doppler and Color Doppler Indications:    I48.0 Paroxysmal atrial fibrillation  History:        Patient has prior history of Echocardiogram examinations, most                 recent 12/22/2005. CAD; Risk Factors:Hypertension and                  Dyslipidemia. Cancer.  Sonographer:    Jonelle Sidle Dance Referring Phys: [id number] Wadley D Paris  1. Left ventricular ejection fraction, by estimation, is 65 to 70%. The left ventricle has hyperdynamic function. The left ventricle has no regional wall motion abnormalities. There is mild concentric left ventricular hypertrophy. Left ventricular diastolic function could not be evaluated.  2. Right ventricular systolic function is normal. The right ventricular size is normal. There is normal pulmonary artery systolic pressure.  3. Left atrial size was moderately dilated.  4. Right atrial size was mildly dilated.  5. The mitral valve is normal in structure. No evidence of mitral valve regurgitation.  6. The aortic valve is tricuspid. There is moderate calcification of the aortic valve. There is moderate thickening of the aortic valve. Aortic valve regurgitation is not visualized. Mild to moderate aortic valve stenosis.  7. Aortic dilatation noted. There is mild dilatation of the aortic root, measuring 40 mm. There is mild dilatation of the ascending aorta, measuring 39 mm.  8. The inferior vena cava is normal in size with greater than 50% respiratory variability, suggesting right atrial pressure of 3 mmHg. FINDINGS  Left Ventricle: Left ventricular ejection fraction, by estimation, is 65 to 70%. The left ventricle has hyperdynamic function. The left ventricle has no regional wall motion abnormalities. The left ventricular internal cavity size was normal in size. There is mild concentric left ventricular hypertrophy. Left ventricular diastolic function could not be evaluated due to atrial fibrillation. Left ventricular diastolic function could not be evaluated. Right Ventricle: The right ventricular size is normal. No increase in right ventricular wall thickness. Right ventricular systolic function  is normal. There is normal pulmonary artery systolic pressure. The tricuspid regurgitant velocity is 2.72 m/s, and   with an assumed right atrial pressure of 3 mmHg, the estimated right ventricular systolic pressure is 02.7 mmHg. Left Atrium: Left atrial size was moderately dilated. Right Atrium: Right atrial size was mildly dilated. Pericardium: There is no evidence of pericardial effusion. Mitral Valve: The mitral valve is normal in structure. Mild to moderate mitral annular calcification. No evidence of mitral valve regurgitation. Tricuspid Valve: The tricuspid valve is normal in structure. Tricuspid valve regurgitation is trivial. Aortic Valve: The aortic valve is tricuspid. There is moderate calcification of the aortic valve. There is moderate thickening of the aortic valve. Aortic valve regurgitation is not visualized. Mild to moderate aortic stenosis is present. Aortic valve mean gradient measures 14.8 mmHg. Aortic valve peak gradient measures 28.6 mmHg. Aortic valve area, by VTI measures 1.35 cm. Pulmonic Valve: The pulmonic valve was not well visualized. Pulmonic valve regurgitation is not visualized. Aorta: Aortic dilatation noted. There is mild dilatation of the aortic root, measuring 40 mm. There is mild dilatation of the ascending aorta, measuring 39 mm. Venous: The inferior vena cava is normal in size with greater than 50% respiratory variability, suggesting right atrial pressure of 3 mmHg. IAS/Shunts: No atrial level shunt detected by color flow Doppler.  LEFT VENTRICLE PLAX 2D LVIDd:         3.08 cm LVIDs:         2.98 cm LV PW:         1.36 cm LV IVS:        1.24 cm LVOT diam:     2.10 cm LV SV:         57 LV SV Index:   26 LVOT Area:     3.46 cm  RIGHT VENTRICLE RV Basal diam:  3.21 cm TAPSE (M-mode): 1.7 cm LEFT ATRIUM             Index       RIGHT ATRIUM           Index LA diam:        4.60 cm 2.05 cm/m  RA Area:     16.30 cm LA Vol (A2C):   74.8 ml 33.34 ml/m RA Volume:   36.70 ml  16.36 ml/m LA Vol (A4C):   73.9 ml 32.94 ml/m LA Biplane Vol: 79.1 ml 35.26 ml/m  AORTIC VALVE AV Area (Vmax):    1.26 cm  AV Area (Vmean):   1.29 cm AV Area (VTI):     1.35 cm AV Vmax:           267.50 cm/s AV Vmean:          178.500 cm/s AV VTI:            0.427 m AV Peak Grad:      28.6 mmHg AV Mean Grad:      14.8 mmHg LVOT Vmax:         97.55 cm/s LVOT Vmean:        66.650 cm/s LVOT VTI:          0.166 m LVOT/AV VTI ratio: 0.39  AORTA Ao Root diam: 4.00 cm Ao Asc diam:  3.90 cm MITRAL VALVE               TRICUSPID VALVE MV Area (PHT): 2.81 cm    TR Peak grad:   29.6 mmHg MV Decel Time: 270 msec    TR Vmax:  272.00 cm/s MV E velocity: 99.90 cm/s                            SHUNTS                            Systemic VTI:  0.17 m                            Systemic Diam: 2.10 cm Dani Gobble Croitoru MD Electronically signed by Sanda Klein MD Signature Date/Time: 07/12/2021/5:32:23 PM    Final    DG Hips Bilat W or Wo Pelvis 3-4 Views  Result Date: 07/11/2021 CLINICAL DATA:  Bilateral hip pain since falling 6 days ago. EXAM: DG HIP (WITH OR WITHOUT PELVIS) 3-4V BILAT COMPARISON:  Radiographs 05/09/2015. FINDINGS: The bones appear demineralized. There is no evidence of acute fracture, dislocation or bone destruction. The hip joint spaces are preserved. There are mild degenerative changes in the lower lumbar spine and sacroiliac joints. There are surgical clips in both groins and scattered vascular calcifications. IMPRESSION: No evidence of acute fracture, dislocation or bone destruction. Scattered vascular calcifications. Electronically Signed   By: Richardean Sale M.D.   On: 07/11/2021 13:56        Scheduled Meds:  acetaminophen  650 mg Oral Q6H   aspirin EC  81 mg Oral Daily   Chlorhexidine Gluconate Cloth  6 each Topical Daily   enoxaparin (LOVENOX) injection  40 mg Subcutaneous Q24H   metoprolol tartrate  25 mg Oral BID   Continuous Infusions:  sodium chloride 250 mL/hr at 07/13/21 0544   cefTRIAXone (ROCEPHIN)  IV Stopped (07/12/21 1046)   fluconazole (DIFLUCAN) IV 100 mg (07/12/21 1724)     LOS: 1 day     Time spent: 35 minutes    Caleb [name] Darleen Crocker, DO [facility]  If 7PM-7AM, please contact night-coverage [email or web address] 07/13/2021, 8:50 AM

## 2021-07-14 ENCOUNTER — Telehealth: Payer: Self-pay | Admitting: *Deleted

## 2021-07-14 LAB — CBC
HCT: 31.4 % — ABNORMAL LOW (ref 39.0–52.0)
Hemoglobin: 10 g/dL — ABNORMAL LOW (ref 13.0–17.0)
MCH: 32.8 pg (ref 26.0–34.0)
MCHC: 31.8 g/dL (ref 30.0–36.0)
MCV: 103 fL — ABNORMAL HIGH (ref 80.0–100.0)
Platelets: 209 10*3/uL (ref 150–400)
RBC: 3.05 MIL/uL — ABNORMAL LOW (ref 4.22–5.81)
RDW: 15.6 % — ABNORMAL HIGH (ref 11.5–15.5)
WBC: 6.7 10*3/uL (ref 4.0–10.5)
nRBC: 0 % (ref 0.0–0.2)

## 2021-07-14 LAB — COMPREHENSIVE METABOLIC PANEL
ALT: 54 U/L — ABNORMAL HIGH (ref 0–44)
AST: 46 U/L — ABNORMAL HIGH (ref 15–41)
Albumin: 2.4 g/dL — ABNORMAL LOW (ref 3.5–5.0)
Alkaline Phosphatase: 42 U/L (ref 38–126)
Anion gap: 5 (ref 5–15)
BUN: 35 mg/dL — ABNORMAL HIGH (ref 8–23)
CO2: 23 mmol/L (ref 22–32)
Calcium: 8.2 mg/dL — ABNORMAL LOW (ref 8.9–10.3)
Chloride: 108 mmol/L (ref 98–111)
Creatinine, Ser: 1.23 mg/dL (ref 0.61–1.24)
GFR, Estimated: 54 mL/min — ABNORMAL LOW (ref >60)
Glucose, Bld: 99 mg/dL (ref 70–99)
Potassium: 4.5 mmol/L (ref 3.5–5.1)
Sodium: 136 mmol/L (ref 135–145)
Total Bilirubin: 0.5 mg/dL (ref 0.3–1.2)
Total Protein: 5.2 g/dL — ABNORMAL LOW (ref 6.5–8.1)

## 2021-07-14 LAB — CK: Total CK: 150 U/L (ref 49–397)

## 2021-07-14 LAB — MAGNESIUM: Magnesium: 2.1 mg/dL (ref 1.7–2.4)

## 2021-07-14 MED ORDER — FLUCONAZOLE 100 MG PO TABS
100.0000 mg | ORAL_TABLET | Freq: Every day | ORAL | Status: DC
  Administered 2021-07-14 – 2021-07-15 (×2): 100 mg via ORAL
  Filled 2021-07-14 (×2): qty 1

## 2021-07-14 NOTE — Progress Notes (Signed)
PROGRESS NOTE    Caleb [name]  IOE:[id number] DOB: 13-Oct-1925 DOA: 07/11/2021 PCP: Sharilyn Sites, MD   Brief Narrative:   Caleb [name] is a 87 y.o. male with a history of coronary artery disease, hyperlipidemia, obesity, history of penile cancer with neurogenic bladder and need for self-catheterization.  Patient had a fall on the floor 6 days ago in the bathroom.  Patient was admitted with fall at home with rhabdomyolysis as well as UTI.  He is noted to have some atrial fibrillation with RVR noted this morning which may be new onset.  His heart rate is better controlled and 2D echocardiogram demonstrates LVEF 65-70% and TSH 1.278.  He is awaiting PT evaluation.  Assessment & Plan:   Active Problems:   Coronary artery disease   Neurogenic bladder   History of penile cancer   Rhabdomyolysis   Fall at home, initial encounter   Acute lower UTI   Fall at home with associated rhabdomyolysis-improving -Continue IV fluid, decreased rate today -Continue to follow CK levels which are downtrending -PT evaluation pending -Fall precautions and likely need for SNF/rehab   Atrial fibrillation with RVR-resolved -Appears to be on metoprolol at home which will be resumed -IV metoprolol given x1 dose with good result -Continue to monitor on telemetry -Does not appear to be a good candidate for anticoagulation due to falls -TSH 1.278 -2D echocardiogram with LVEF 65-70%   UTI with bacteria and yeast -Continue Rocephin and Diflucan -Urine culture with multiple species, to be recollected -Patient will have Foley exchanged with urologist outpatient as scheduled   Osteoarthritis -Plan to try scheduled Tylenol which is helping   History of penile cancer neurogenic bladder with self-catheterization at home -Continue to monitor -Replace catheter in outpatient setting as scheduled with urology   CAD -2D echocardiogram with LVEF 65-70% with no acute abnormalities -No chest pain noted      DVT prophylaxis:Lovenox Code Status: Full Family Communication: Discussed with son on phone 7/17 Disposition Plan:  Status is: Inpatient   Remains inpatient appropriate because:Unsafe d/c plan, IV treatments appropriate due to intensity of illness or inability to take PO, and Inpatient level of care appropriate due to severity of illness   Dispo: The patient is from: Home              Anticipated d/c is to: SNF              Patient currently is not medically stable to d/c.              Difficult to place patient No     Consultants:  None   Procedures:  See below  Antimicrobials:  Anti-infectives (From admission, onward)    Start     Dose/Rate Route Frequency Ordered Stop   07/12/21 1000  cefTRIAXone (ROCEPHIN) 1 g in sodium chloride 0.9 % 100 mL IVPB        1 g 200 mL/hr over 30 Minutes Intravenous Every 24 hours 07/12/21 0830     07/11/21 1800  fluconazole (DIFLUCAN) IVPB 100 mg        100 mg 50 mL/hr over 60 Minutes Intravenous Every 24 hours 07/11/21 1758     07/11/21 1515  cefTRIAXone (ROCEPHIN) 1 g in sodium chloride 0.9 % 100 mL IVPB        1 g 200 mL/hr over 30 Minutes Intravenous  Once 07/11/21 1507 07/11/21 1730       Subjective: Patient seen and evaluated today with no new acute  complaints or concerns. No acute concerns or events noted overnight. His hands are feeling better with the use of Tylenol.  Objective: Vitals:   07/13/21 1450 07/13/21 2048 07/14/21 0432 07/14/21 0939  BP: 101/71 119/80 120/73 (!) 153/74  Pulse: (!) 53 80 (!) 55 (!) 51  Resp: 18 19 19    Temp: 98.1 F (36.7 C) (!) 97.5 F (36.4 C) 97.6 F (36.4 C)   TempSrc: Oral Oral Oral   SpO2: 96% 97% 95% 97%  Weight:      Height:        Intake/Output Summary (Last 24 hours) at 07/14/2021 1024 Last data filed at 07/14/2021 0427 Gross per 24 hour  Intake 240 ml  Output 1450 ml  Net -1210 ml   Filed Weights   07/11/21 1250 07/11/21 2000  Weight: 115.7 kg 107.5 kg     Examination:  General exam: Appears calm and comfortable  Respiratory system: Clear to auscultation. Respiratory effort normal. Cardiovascular system: S1 & S2 heard, RRR.  Gastrointestinal system: Abdomen is soft Central nervous system: Alert and awake Extremities: No edema Skin: No significant lesions noted Psychiatry: Flat affect. Foley with clear, yellow, UO.    Data Reviewed: I have personally reviewed following labs and imaging studies  CBC: Recent Labs  Lab 07/11/21 1316 07/12/21 0412 07/13/21 0225 07/14/21 0523  WBC 13.9* 9.6 8.0 6.7  NEUTROABS 11.9*  --   --   --   HGB 11.8* 11.0* 9.3* 10.0*  HCT 35.7* 32.9* 29.4* 31.4*  MCV 98.1 97.9 100.3* 103.0*  PLT 218 204 185 626   Basic Metabolic Panel: Recent Labs  Lab 07/11/21 1316 07/12/21 0412 07/13/21 0225 07/14/21 0523  NA 137 136 136 136  K 4.6 4.0 4.1 4.5  CL 105 107 110 108  CO2 24 24 21* 23  GLUCOSE 133* 129* 105* 99  BUN 63* 54* 47* 35*  CREATININE 1.39* 1.32* 1.26* 1.23  CALCIUM 9.2 8.5* 7.8* 8.2*  MG 2.4  --  2.1 2.1   GFR: Estimated Creatinine Clearance: 45.1 mL/min (by C-G formula based on SCr of 1.23 mg/dL). Liver Function Tests: Recent Labs  Lab 07/11/21 1316 07/12/21 0412 07/13/21 0225 07/14/21 0523  AST 91* 67* 49* 46*  ALT 80* 63* 52* 54*  ALKPHOS 59 49 40 42  BILITOT 1.6* 0.8 0.5 0.5  PROT 6.3* 5.7* 4.9* 5.2*  ALBUMIN 3.4* 2.9* 2.4* 2.4*   No results for input(s): LIPASE, AMYLASE in the last 168 hours. No results for input(s): AMMONIA in the last 168 hours. Coagulation Profile: Recent Labs  Lab 07/11/21 1316  INR 1.3*   Cardiac Enzymes: Recent Labs  Lab 07/11/21 1316 07/12/21 0412 07/13/21 0225 07/14/21 0523  CKTOTAL 897* 653* 393 150   BNP (last 3 results) No results for input(s): PROBNP in the last 8760 hours. HbA1C: No results for input(s): HGBA1C in the last 72 hours. CBG: No results for input(s): GLUCAP in the last 168 hours. Lipid Profile: No results  for input(s): CHOL, HDL, LDLCALC, TRIG, CHOLHDL, LDLDIRECT in the last 72 hours. Thyroid Function Tests: Recent Labs    07/11/21 1610  TSH 1.278   Anemia Panel: No results for input(s): VITAMINB12, FOLATE, FERRITIN, TIBC, IRON, RETICCTPCT in the last 72 hours. Sepsis Labs: No results for input(s): PROCALCITON, LATICACIDVEN in the last 168 hours.  Recent Results (from the past 240 hour(s))  Urine Culture     Status: Abnormal   Collection Time: 07/11/21 12:59 PM   Specimen: Urine, Catheterized  Result Value  Ref Range Status   Specimen Description   Final    URINE, CATHETERIZED Performed at [facility], [address] [place], [place] [address]    Special Requests   Final    NONE Performed at [facility], [address] [place], [place] [address]    Culture MULTIPLE SPECIES PRESENT, SUGGEST RECOLLECTION (A)  Final   Report Status 07/12/2021 FINAL  Final  Resp Panel by RT-PCR (Flu A&B, Covid) Nasopharyngeal Swab     Status: None   Collection Time: 07/11/21  5:47 PM   Specimen: Nasopharyngeal Swab; Nasopharyngeal(NP) swabs in vial transport medium  Result Value Ref Range Status   SARS Coronavirus 2 by RT PCR NEGATIVE NEGATIVE Final    Comment: (NOTE) SARS-CoV-2 target nucleic acids are NOT DETECTED.  The SARS-CoV-2 RNA is generally detectable in upper respiratory specimens during the acute phase of infection. The lowest concentration of SARS-CoV-2 viral copies this assay can detect is 138 copies/mL. A negative result does not preclude SARS-Cov-2 infection and should not be used as the sole basis for treatment or other patient management decisions. A negative result may occur with  improper specimen collection/handling, submission of specimen other than nasopharyngeal swab, presence of viral mutation(s) within the areas targeted by this assay, and inadequate number of viral copies(<138 copies/mL). A negative result must be combined with clinical observations, patient history,  and epidemiological information. The expected result is Negative.  Fact Sheet for Patients:  EntrepreneurPulse.com.au  Fact Sheet for Healthcare Providers:  IncredibleEmployment.be  This test is no t yet approved or cleared by the [place] FDA and  has been authorized for detection and/or diagnosis of SARS-CoV-2 by FDA under an Emergency Use Authorization (EUA). This EUA will remain  in effect (meaning this test can be used) for the duration of the COVID-19 declaration under Section 564(b)(1) of the Act, 21 U.S.C.section 360bbb-3(b)(1), unless the authorization is terminated  or revoked sooner.       Influenza A by PCR NEGATIVE NEGATIVE Final   Influenza B by PCR NEGATIVE NEGATIVE Final    Comment: (NOTE) The Xpert Xpress SARS-CoV-2/FLU/RSV plus assay is intended as an aid in the diagnosis of influenza from Nasopharyngeal swab specimens and should not be used as a sole basis for treatment. Nasal washings and aspirates are unacceptable for Xpert Xpress SARS-CoV-2/FLU/RSV testing.  Fact Sheet for Patients: EntrepreneurPulse.com.au  Fact Sheet for Healthcare Providers: IncredibleEmployment.be  This test is not yet approved or cleared by the [place] FDA and has been authorized for detection and/or diagnosis of SARS-CoV-2 by FDA under an Emergency Use Authorization (EUA). This EUA will remain in effect (meaning this test can be used) for the duration of the COVID-19 declaration under Section 564(b)(1) of the Act, 21 U.S.C. section 360bbb-3(b)(1), unless the authorization is terminated or revoked.  Performed at [facility], [address], [place],  [address]          Radiology Studies: ECHOCARDIOGRAM COMPLETE  Result Date: 07/12/2021    ECHOCARDIOGRAM REPORT   Patient Name:   RAESHAWN VO Date of Exam: 07/12/2021 Medical Rec #:  [id number]       Height:       70.0 in Accession #:     [id number]      Weight:       237.0 lb Date of Birth:  09/01/1925        BSA:          2.243 m Patient Age:    65 years  BP:           107/64 mmHg Patient Gender: M               HR:           112 bpm. Exam Location:  [facility] Procedure: 2D Echo, Cardiac Doppler and Color Doppler Indications:    I48.0 Paroxysmal atrial fibrillation  History:        Patient has prior history of Echocardiogram examinations, most                 recent 12/22/2005. CAD; Risk Factors:Hypertension and                 Dyslipidemia. Cancer.  Sonographer:    Jonelle Sidle Dance Referring Phys: [id number] Avella D [place]  1. Left ventricular ejection fraction, by estimation, is 65 to 70%. The left ventricle has hyperdynamic function. The left ventricle has no regional wall motion abnormalities. There is mild concentric left ventricular hypertrophy. Left ventricular diastolic function could not be evaluated.  2. Right ventricular systolic function is normal. The right ventricular size is normal. There is normal pulmonary artery systolic pressure.  3. Left atrial size was moderately dilated.  4. Right atrial size was mildly dilated.  5. The mitral valve is normal in structure. No evidence of mitral valve regurgitation.  6. The aortic valve is tricuspid. There is moderate calcification of the aortic valve. There is moderate thickening of the aortic valve. Aortic valve regurgitation is not visualized. Mild to moderate aortic valve stenosis.  7. Aortic dilatation noted. There is mild dilatation of the aortic root, measuring 40 mm. There is mild dilatation of the ascending aorta, measuring 39 mm.  8. The inferior vena cava is normal in size with greater than 50% respiratory variability, suggesting right atrial pressure of 3 mmHg. FINDINGS  Left Ventricle: Left ventricular ejection fraction, by estimation, is 65 to 70%. The left ventricle has hyperdynamic function. The left ventricle has no regional wall motion abnormalities. The left  ventricular internal cavity size was normal in size. There is mild concentric left ventricular hypertrophy. Left ventricular diastolic function could not be evaluated due to atrial fibrillation. Left ventricular diastolic function could not be evaluated. Right Ventricle: The right ventricular size is normal. No increase in right ventricular wall thickness. Right ventricular systolic function is normal. There is normal pulmonary artery systolic pressure. The tricuspid regurgitant velocity is 2.72 m/s, and  with an assumed right atrial pressure of 3 mmHg, the estimated right ventricular systolic pressure is 73.4 mmHg. Left Atrium: Left atrial size was moderately dilated. Right Atrium: Right atrial size was mildly dilated. Pericardium: There is no evidence of pericardial effusion. Mitral Valve: The mitral valve is normal in structure. Mild to moderate mitral annular calcification. No evidence of mitral valve regurgitation. Tricuspid Valve: The tricuspid valve is normal in structure. Tricuspid valve regurgitation is trivial. Aortic Valve: The aortic valve is tricuspid. There is moderate calcification of the aortic valve. There is moderate thickening of the aortic valve. Aortic valve regurgitation is not visualized. Mild to moderate aortic stenosis is present. Aortic valve mean gradient measures 14.8 mmHg. Aortic valve peak gradient measures 28.6 mmHg. Aortic valve area, by VTI measures 1.35 cm. Pulmonic Valve: The pulmonic valve was not well visualized. Pulmonic valve regurgitation is not visualized. Aorta: Aortic dilatation noted. There is mild dilatation of the aortic root, measuring 40 mm. There is mild dilatation of the ascending aorta, measuring 39 mm. Venous: The inferior vena cava is  normal in size with greater than 50% respiratory variability, suggesting right atrial pressure of 3 mmHg. IAS/Shunts: No atrial level shunt detected by color flow Doppler.  LEFT VENTRICLE PLAX 2D LVIDd:         3.08 cm LVIDs:          2.98 cm LV PW:         1.36 cm LV IVS:        1.24 cm LVOT diam:     2.10 cm LV SV:         57 LV SV Index:   26 LVOT Area:     3.46 cm  RIGHT VENTRICLE RV Basal diam:  3.21 cm TAPSE (M-mode): 1.7 cm LEFT ATRIUM             Index       RIGHT ATRIUM           Index LA diam:        4.60 cm 2.05 cm/m  RA Area:     16.30 cm LA Vol (A2C):   74.8 ml 33.34 ml/m RA Volume:   36.70 ml  16.36 ml/m LA Vol (A4C):   73.9 ml 32.94 ml/m LA Biplane Vol: 79.1 ml 35.26 ml/m  AORTIC VALVE AV Area (Vmax):    1.26 cm AV Area (Vmean):   1.29 cm AV Area (VTI):     1.35 cm AV Vmax:           267.50 cm/s AV Vmean:          178.500 cm/s AV VTI:            0.427 m AV Peak Grad:      28.6 mmHg AV Mean Grad:      14.8 mmHg LVOT Vmax:         97.55 cm/s LVOT Vmean:        66.650 cm/s LVOT VTI:          0.166 m LVOT/AV VTI ratio: 0.39  AORTA Ao Root diam: 4.00 cm Ao Asc diam:  3.90 cm MITRAL VALVE               TRICUSPID VALVE MV Area (PHT): 2.81 cm    TR Peak grad:   29.6 mmHg MV Decel Time: 270 msec    TR Vmax:        272.00 cm/s MV E velocity: 99.90 cm/s                            SHUNTS                            Systemic VTI:  0.17 m                            Systemic Diam: 2.10 cm Mihai Croitoru MD Electronically signed by Sanda Klein MD Signature Date/Time: 07/12/2021/5:32:23 PM    Final         Scheduled Meds:  acetaminophen  650 mg Oral Q6H   aspirin EC  81 mg Oral Daily   Chlorhexidine Gluconate Cloth  6 each Topical Daily   enoxaparin (LOVENOX) injection  40 mg Subcutaneous Q24H   metoprolol tartrate  25 mg Oral BID   Continuous Infusions:  sodium chloride 75 mL/hr at 07/14/21 0248   cefTRIAXone (ROCEPHIN)  IV 1 g (07/13/21 0852)   fluconazole (  DIFLUCAN) IV 100 mg (07/13/21 1803)     LOS: 2 days    Time spent: 35 minutes    Traveion Ruddock D Manuella Ghazi, DO [facility]  If 7PM-7AM, please contact night-coverage [email or web address] 07/14/2021, 10:24 AM

## 2021-07-14 NOTE — Telephone Encounter (Signed)
Patient is calling to inform that he has to cancel his appointment, is in the hospital.

## 2021-07-14 NOTE — Evaluation (Signed)
Physical Therapy Evaluation Patient Details Name: Caleb [name] MRN: [id number] DOB: [birth year] Today's Date: 07/14/2021   History of Present Illness  Caleb [name] is a 87 y.o. male with a history of coronary artery disease, hyperlipidemia, obesity, history of penile cancer with neurogenic bladder and need for self-catheterization.  Patient had a fall on the floor 6 days ago in the bathroom.  He was unable to get up and has been on the floor since that time.  The Tour manager finally realized that he was not picking up his mail, and called the sheriff for a welfare check, and the patient was found.  Patient has sores on his body from being on the ground.  He has generalized body aches.  No palliating or provoking factors.  Denies fevers, chills, nausea, vomiting.   Clinical Impression  Patient demonstrates slow labored movement for sitting up at bedside with [facility] raised and Mod assist, at high risk for falls with difficulty completing sit to stands due to BLE weakness with frequent buckling of knees requiring repeated attempts before able to transfer to chair and limited to a few shuffling side steps before having to sit.  Patient tolerated sitting up in chair after therapy - nursing staff aware.  Patient will benefit from continued physical therapy in hospital and recommended venue below to increase strength, balance, endurance for safe ADLs and gait.       Follow Up Recommendations SNF    Equipment Recommendations  None recommended by PT    Recommendations for Other Services       Precautions / Restrictions Precautions Precautions: Fall Restrictions Weight Bearing Restrictions: No      Mobility  Bed Mobility Overal bed mobility: Needs Assistance Bed Mobility: Supine to Sit     Supine to sit: Mod assist;HOB elevated     General bed mobility comments: slow labored movement with HOB raised approximately 30 degrees    Transfers Overall transfer level: Needs assistance    Transfers: Sit to/from Stand;Stand Pivot Transfers Sit to Stand: Mod assist Stand pivot transfers: Mod assist       General transfer comment: has difficulty with sit to stands due to BLE weakness  Ambulation/Gait Ambulation/Gait assistance: Mod assist;Max assist Gait Distance (Feet): 4 Feet Assistive device: Rolling Risdon (2 wheeled) Gait Pattern/deviations: Decreased step length - right;Decreased step length - left;Decreased stride length Gait velocity: slow   General Gait Details: limited to 4-5 slow unsteady labored side steps with frequent buckling of knees with loss of balance  Stairs            Wheelchair Mobility    Modified Rankin (Stroke Patients Only)       Balance Overall balance assessment: Needs assistance Sitting-balance support: Feet supported;No upper extremity supported Sitting balance-Leahy Scale: Fair Sitting balance - Comments: fair/good seated at EOB   Standing balance support: During functional activity;Bilateral upper extremity supported Standing balance-Leahy Scale: Poor Standing balance comment: using RW                             Pertinent Vitals/Pain Pain Assessment: No/denies pain    Home Living Family/patient expects to be discharged to:: Private residence Living Arrangements: Alone Available Help at Discharge: Other (Comment) (Patient states he has no help) Type of Home: House Home Access: Stairs to enter Entrance Stairs-Rails: Right;Left;Can reach both Entrance Stairs-Number of Steps: 2 Home Layout: One level Home Equipment: Reuss - 4 wheels;Cane - single point;Grab bars -  tub/shower      Prior Function Level of Independence: Independent with assistive device(s)         Comments: community ambulator, drives, usually has someone with him when shopping     Hand Dominance        Extremity/Trunk Assessment   Upper Extremity Assessment Upper Extremity Assessment: Generalized weakness    Lower  Extremity Assessment Lower Extremity Assessment: Generalized weakness    Cervical / Trunk Assessment Cervical / Trunk Assessment: Kyphotic  Communication   Communication: No difficulties  Cognition Arousal/Alertness: Awake/alert Behavior During Therapy: WFL for tasks assessed/performed Overall Cognitive Status: Within Functional Limits for tasks assessed                                        General Comments      Exercises     Assessment/Plan    PT Assessment Patient needs continued PT services  PT Problem List Decreased strength;Decreased activity tolerance;Decreased balance;Decreased mobility       PT Treatment Interventions DME instruction;Gait training;Stair training;Functional mobility training;Therapeutic exercise;Therapeutic activities;Patient/family education;Balance training    PT Goals (Current goals can be found in the Care Plan section)  Acute Rehab PT Goals Patient Stated Goal: return home after rehab PT Goal Formulation: With patient Time For Goal Achievement: 08/04/21 Potential to Achieve Goals: Good    Frequency Min 3X/week   Barriers to discharge        Co-evaluation               AM-PAC PT "6 Clicks" Mobility  Outcome Measure Help needed turning from your back to your side while in a flat bed without using bedrails?: A Lot Help needed moving from lying on your back to sitting on the side of a flat bed without using bedrails?: A Lot Help needed moving to and from a bed to a chair (including a wheelchair)?: A Lot Help needed standing up from a chair using your arms (e.g., wheelchair or bedside chair)?: A Lot Help needed to walk in hospital room?: A Lot Help needed climbing 3-5 steps with a railing? : Total 6 Click Score: 11    End of Session   Activity Tolerance: Patient tolerated treatment well;Patient limited by fatigue Patient left: in chair;with call bell/phone within reach Nurse Communication: Mobility status PT  Visit Diagnosis: Unsteadiness on feet (R26.81);Other abnormalities of gait and mobility (R26.89)    Time: 2119-4174 PT Time Calculation (min) (ACUTE ONLY): 21 min   Charges:   PT Evaluation $PT Eval Moderate Complexity: 1 Mod PT Treatments $Therapeutic Activity: 8-22 mins        10:51 AM, 07/14/21 Lonell Grandchild, MPT Physical Therapist with [facility] [phone] office [phone] mobile phone

## 2021-07-14 NOTE — TOC Initial Note (Addendum)
Transition of Care [facility]) - Initial/Assessment Note    Patient Details  Name: Caleb [name] MRN: [id number] Date of Birth: 01/03/1925  Transition of Care [facility]) CM/SW Contact:    Boneta Lucks, RN Phone Number: 07/14/2021, 11:09 AM  Clinical Narrative:      Patient admitted with rhabdomyolysis, lives alone. PT is recommending SNF. TOC could not reach the patient, called her sonMustaf Antonacci. He is agreeing patient will need rehab before returning home. They live in [place], wants TOC to send to multiple facilities and call him to discuss ratings of bed offers. TOC to follow.             Patient is vaccinated for COVID.  Expected Discharge Plan: Skilled Nursing Facility Barriers to Discharge: Continued Medical Work up, SNF Pending bed offer   Patient Goals and CMS Choice Patient states their goals for this hospitalization and ongoing recovery are:: to go to SNF. CMS Medicare.gov Compare Post Acute Care list provided to:: Patient Represenative (must comment) Choice offered to / list presented to : Adult Children  Expected Discharge Plan and Services Expected Discharge Plan: [place]      Living arrangements for the past 2 months: Single Family Home                  Prior Living Arrangements/Services Living arrangements for the past 2 months: Single Family Home Lives with:: Self Patient language and need for interpreter reviewed:: Yes        Need for Family Participation in Patient Care: Yes (Comment) Care giver support system in place?: Yes (comment)   Criminal Activity/Legal Involvement Pertinent to Current Situation/Hospitalization: No - Comment as needed  Activities of Daily Living Home Assistive Devices/Equipment: None ADL Screening (condition at time of admission) Patient's cognitive ability adequate to safely complete daily activities?: Yes Is the patient deaf or have difficulty hearing?: No Does the patient have difficulty seeing, even when wearing  glasses/contacts?: No Does the patient have difficulty concentrating, remembering, or making decisions?: No Patient able to express need for assistance with ADLs?: Yes Does the patient have difficulty dressing or bathing?: No Independently performs ADLs?: Yes (appropriate for developmental age) Does the patient have difficulty walking or climbing stairs?: Yes Weakness of Legs: Both Weakness of Arms/Hands: Both  Permission Sought/Granted   Permission granted to share info w Relationship: Son    Emotional Assessment  Orientation: : Oriented to Self, Oriented to Place, Oriented to  Time, Oriented to Situation Alcohol / Substance Use: Not Applicable Psych Involvement: No (comment)  Admission diagnosis:  Rhabdomyolysis [M62.82] Transaminitis [R74.01] Fall [W19.XXXA] Renal insufficiency [N28.9] FTT (failure to thrive) in adult [R62.7] Urinary tract infection associated with indwelling urethral catheter, initial encounter ([place]) [O13.086V, N39.0] Patient Active Problem List   Diagnosis Date Noted   Rhabdomyolysis 07/11/2021   Fall at home, initial encounter 07/11/2021   Acute lower UTI 07/11/2021   Urinary retention 03/27/2020   Exudative age-related macular degeneration, right eye, with active choroidal neovascularization ([place]) 08/23/2017   BMI 32.0-32.9,adult 02/26/2016   Neurogenic bladder 02/26/2016   Scalp hematoma 01/13/2016   Self-catheterizes urinary bladder 01/13/2016   Pneumothorax, left 01/12/2016   Atypical chest pain 12/02/2015   Muscular deconditioning 02/20/2015   History of penile cancer 01/09/2015   Cellulitis 05/17/2014   Pain in limb- Slight pain- Left Leg 05/03/2014   Discoloration of skin-Left Leg 05/03/2014   Stasis edema with ulcer ([place]) 08/17/2013   Peripheral vascular disease, unspecified ([place]) 07/13/2013   Swelling of limb  07/13/2013   Coronary artery disease 06/07/2013   Left upper extremity numbness 06/07/2013   Hyperlipidemia    Knee pain  08/11/2011   PCP:  Sharilyn Sites, MD Pharmacy:   [facility], [place] AT [place] [address] [place] [address] Phone: [phone] Fax: [phone]  [facility] ([facility]) - [place], [place] [address] Phone: [phone] Fax: [phone]   Readmission Risk Interventions Readmission Risk Prevention Plan 07/14/2021  Medication Screening Complete  Transportation Screening Complete  Some recent data might be hidden

## 2021-07-14 NOTE — Progress Notes (Addendum)
Pharmacy Antibiotic Note  Caleb [name] is a 87 y.o. male admitted on 07/11/2021 with UTI.  Pharmacy has been consulted for fluconazole dosing.  Yeast in urine. AF Plan: Fluconazole 100mg  po daily  (adjusted based on renal dysfunction) for total of 14 days Monitor V/S, labs  Height: 5\' 10"  (177.8 cm) Weight: 107.5 kg (236 lb 15.9 oz) IBW/kg (Calculated) : 73  Temp (24hrs), Avg:97.7 F (36.5 C), Min:97.5 F (36.4 C), Max:98.1 F (36.7 C)  Recent Labs  Lab 07/11/21 1316 07/12/21 0412 07/13/21 0225 07/14/21 0523  WBC 13.9* 9.6 8.0 6.7  CREATININE 1.39* 1.32* 1.26* 1.23     Estimated Creatinine Clearance: 45.1 mL/min (by C-G formula based on SCr of 1.23 mg/dL).    No Known Allergies  Antimicrobials this admission: ceftriaxone 7/15>> Fluconazole 7/15>>  Microbiology results: 7/15 UCx: multiple species, NTR 7/17 UC: pending  Thank you for allowing pharmacy to be a part of this patient's care.  Isac Sarna, BS [place], [place] Clinical Pharmacist Pager [phone]  07/14/2021 12:57 PM

## 2021-07-14 NOTE — Plan of Care (Signed)
  Problem: Acute Rehab PT Goals(only PT should resolve) Goal: Pt Will Go Supine/Side To Sit Outcome: Progressing Flowsheets (Taken 07/14/2021 1052) Pt will go Supine/Side to Sit:  with minimal assist  with moderate assist Goal: Patient Will Transfer Sit To/From Stand Outcome: Progressing Flowsheets (Taken 07/14/2021 1052) Patient will transfer sit to/from stand:  with minimal assist  with moderate assist Goal: Pt Will Transfer Bed To Chair/Chair To Bed Outcome: Progressing Flowsheets (Taken 07/14/2021 1052) Pt will Transfer Bed to Chair/Chair to Bed:  with min assist  with mod assist Goal: Pt Will Ambulate Outcome: Progressing Flowsheets (Taken 07/14/2021 1052) Pt will Ambulate:  15 feet  with moderate assist  with rolling [place]   10:53 AM, 07/14/21 Lonell Grandchild, MPT Physical Therapist with [facility] [phone] office [phone] mobile phone

## 2021-07-14 NOTE — NC FL2 (Signed)
[place] LEVEL OF CARE SCREENING TOOL     IDENTIFICATION  Patient Name: Caleb [name] Birthdate: 06/20/1925 Sex: male Admission Date (Current Location): 07/11/2021  [facility] and [place] Number:  [employer] and Address:  [facility] [address], [place]      Provider Number: [id number]  Attending Physician Name and Address:  Rodena Goldmann, DO  Relative Name and Phone Number:  Dionne Rossa - Son [phone]    Current Level of Care: Hospital Recommended Level of Care: [place] Prior Approval Number:    Date Approved/Denied:   PASRR Number: [id number]  Discharge Plan: SNF    Current Diagnoses: Patient Active Problem List   Diagnosis Date Noted   Rhabdomyolysis 07/11/2021   Fall at home, initial encounter 07/11/2021   Acute lower UTI 07/11/2021   Urinary retention 03/27/2020   Exudative age-related macular degeneration, right eye, with active choroidal neovascularization ([place]) 08/23/2017   BMI 32.0-32.9,adult 02/26/2016   Neurogenic bladder 02/26/2016   Scalp hematoma 01/13/2016   Self-catheterizes urinary bladder 01/13/2016   Pneumothorax, left 01/12/2016   Atypical chest pain 12/02/2015   Muscular deconditioning 02/20/2015   History of penile cancer 01/09/2015   Cellulitis 05/17/2014   Pain in limb- Slight pain- Left Leg 05/03/2014   Discoloration of skin-Left Leg 05/03/2014   Stasis edema with ulcer ([place]) 08/17/2013   Peripheral vascular disease, unspecified ([place]) 07/13/2013   Swelling of limb 07/13/2013   Coronary artery disease 06/07/2013   Left upper extremity numbness 06/07/2013   Hyperlipidemia    Knee pain 08/11/2011    Orientation RESPIRATION BLADDER Height & Weight     Self, Time, Situation, Place  Normal External catheter Weight: 107.5 kg Height:  5\' 10"  (177.8 cm)  BEHAVIORAL SYMPTOMS/MOOD NEUROLOGICAL BOWEL NUTRITION STATUS      Continent Diet (See DC summary)   AMBULATORY STATUS COMMUNICATION OF NEEDS Skin   Extensive Assist Verbally Bruising                       Personal Care Assistance Level of Assistance  Bathing, Feeding, Dressing Bathing Assistance: Maximum assistance Feeding assistance: Independent Dressing Assistance: Limited assistance     Functional Limitations Info  Sight, Hearing, Speech Sight Info: Adequate Hearing Info: Impaired Speech Info: Adequate    SPECIAL CARE FACTORS FREQUENCY  PT (By licensed PT)     PT Frequency: 5 times a week              Contractures Contractures Info: Not present    Additional Factors Info  Code Status, Allergies Code Status Info: Full Allergies Info: NKDA           Current Medications (07/14/2021):  This is the current hospital active medication list Current Facility-Administered Medications  Medication Dose Route Frequency Provider Last Rate Last Admin   0.9 %  sodium chloride infusion   Intravenous Continuous Heath Lark D, DO 75 mL/hr at 07/14/21 0248 New Bag at 07/14/21 0248   acetaminophen (TYLENOL) tablet 650 mg  650 mg Oral Q6H Shah, Pratik D, DO   650 mg at 07/14/21 0940   aspirin EC tablet 81 mg  81 mg Oral Daily Truett Mainland, DO   81 mg at 07/14/21 0941   cefTRIAXone (ROCEPHIN) 1 g in sodium chloride 0.9 % 100 mL IVPB  1 g Intravenous Q24H Manuella Ghazi, Pratik D, DO 200 mL/hr at 07/13/21 0852 1 g at 07/13/21 0852   Chlorhexidine Gluconate  Cloth 2 % PADS 6 each  6 each Topical Daily Truett Mainland, DO   6 each at 07/14/21 0941   enoxaparin (LOVENOX) injection 40 mg  40 mg Subcutaneous Q24H Truett Mainland, DO   40 mg at 07/13/21 2154   fluconazole (DIFLUCAN) IVPB 100 mg  100 mg Intravenous Q24H Truett Mainland, DO 50 mL/hr at 07/13/21 1803 100 mg at 07/13/21 1803   metoprolol tartrate (LOPRESSOR) injection 5 mg  5 mg Intravenous Q6H PRN Heath Lark D, DO   5 mg at 07/13/21 0549   metoprolol tartrate (LOPRESSOR) tablet 25 mg  25 mg Oral BID Heath Lark D, DO   25  mg at 07/14/21 0940   ondansetron (ZOFRAN) tablet 4 mg  4 mg Oral Q6H PRN Truett Mainland, DO       Or   ondansetron [facility]) injection 4 mg  4 mg Intravenous Q6H PRN Truett Mainland, DO       oxyCODONE (Oxy IR/ROXICODONE) immediate release tablet 5 mg  5 mg Oral Q4H PRN Truett Mainland, DO   5 mg at 07/13/21 1158     Discharge Medications: Please see discharge summary for a list of discharge medications.  Relevant Imaging Results:  Relevant Lab Results:   Additional Information SS# [id number]  Boneta Lucks, RN

## 2021-07-15 DIAGNOSIS — D649 Anemia, unspecified: Secondary | ICD-10-CM | POA: Diagnosis not present

## 2021-07-15 DIAGNOSIS — I4819 Other persistent atrial fibrillation: Secondary | ICD-10-CM | POA: Diagnosis not present

## 2021-07-15 DIAGNOSIS — M6282 Rhabdomyolysis: Secondary | ICD-10-CM | POA: Diagnosis not present

## 2021-07-15 DIAGNOSIS — K920 Hematemesis: Secondary | ICD-10-CM | POA: Diagnosis not present

## 2021-07-15 DIAGNOSIS — K922 Gastrointestinal hemorrhage, unspecified: Secondary | ICD-10-CM | POA: Diagnosis not present

## 2021-07-15 DIAGNOSIS — I2510 Atherosclerotic heart disease of native coronary artery without angina pectoris: Secondary | ICD-10-CM | POA: Diagnosis present

## 2021-07-15 DIAGNOSIS — R1111 Vomiting without nausea: Secondary | ICD-10-CM | POA: Diagnosis not present

## 2021-07-15 DIAGNOSIS — Z9181 History of falling: Secondary | ICD-10-CM | POA: Diagnosis not present

## 2021-07-15 DIAGNOSIS — E669 Obesity, unspecified: Secondary | ICD-10-CM | POA: Diagnosis not present

## 2021-07-15 DIAGNOSIS — R042 Hemoptysis: Secondary | ICD-10-CM | POA: Diagnosis not present

## 2021-07-15 DIAGNOSIS — L89121 Pressure ulcer of left upper back, stage 1: Secondary | ICD-10-CM | POA: Diagnosis not present

## 2021-07-15 DIAGNOSIS — I5032 Chronic diastolic (congestive) heart failure: Secondary | ICD-10-CM | POA: Diagnosis not present

## 2021-07-15 DIAGNOSIS — N1831 Chronic kidney disease, stage 3a: Secondary | ICD-10-CM | POA: Diagnosis not present

## 2021-07-15 DIAGNOSIS — N179 Acute kidney failure, unspecified: Secondary | ICD-10-CM | POA: Diagnosis not present

## 2021-07-15 DIAGNOSIS — R5381 Other malaise: Secondary | ICD-10-CM | POA: Diagnosis not present

## 2021-07-15 DIAGNOSIS — Z6835 Body mass index (BMI) 35.0-35.9, adult: Secondary | ICD-10-CM | POA: Diagnosis not present

## 2021-07-15 DIAGNOSIS — Z20822 Contact with and (suspected) exposure to covid-19: Secondary | ICD-10-CM | POA: Diagnosis not present

## 2021-07-15 DIAGNOSIS — D72829 Elevated white blood cell count, unspecified: Secondary | ICD-10-CM | POA: Diagnosis not present

## 2021-07-15 DIAGNOSIS — R2681 Unsteadiness on feet: Secondary | ICD-10-CM | POA: Diagnosis not present

## 2021-07-15 DIAGNOSIS — L89151 Pressure ulcer of sacral region, stage 1: Secondary | ICD-10-CM | POA: Diagnosis present

## 2021-07-15 DIAGNOSIS — I959 Hypotension, unspecified: Secondary | ICD-10-CM | POA: Diagnosis not present

## 2021-07-15 DIAGNOSIS — H353211 Exudative age-related macular degeneration, right eye, with active choroidal neovascularization: Secondary | ICD-10-CM | POA: Diagnosis not present

## 2021-07-15 DIAGNOSIS — E785 Hyperlipidemia, unspecified: Secondary | ICD-10-CM | POA: Diagnosis present

## 2021-07-15 DIAGNOSIS — R7401 Elevation of levels of liver transaminase levels: Secondary | ICD-10-CM | POA: Diagnosis present

## 2021-07-15 DIAGNOSIS — Z7401 Bed confinement status: Secondary | ICD-10-CM | POA: Diagnosis not present

## 2021-07-15 DIAGNOSIS — I480 Paroxysmal atrial fibrillation: Secondary | ICD-10-CM | POA: Diagnosis present

## 2021-07-15 DIAGNOSIS — R69 Illness, unspecified: Secondary | ICD-10-CM | POA: Diagnosis not present

## 2021-07-15 DIAGNOSIS — R58 Hemorrhage, not elsewhere classified: Secondary | ICD-10-CM | POA: Diagnosis not present

## 2021-07-15 DIAGNOSIS — R339 Retention of urine, unspecified: Secondary | ICD-10-CM | POA: Diagnosis not present

## 2021-07-15 DIAGNOSIS — I4891 Unspecified atrial fibrillation: Secondary | ICD-10-CM | POA: Diagnosis not present

## 2021-07-15 DIAGNOSIS — L8990 Pressure ulcer of unspecified site, unspecified stage: Secondary | ICD-10-CM | POA: Diagnosis not present

## 2021-07-15 DIAGNOSIS — R0902 Hypoxemia: Secondary | ICD-10-CM | POA: Diagnosis not present

## 2021-07-15 DIAGNOSIS — Z23 Encounter for immunization: Secondary | ICD-10-CM | POA: Diagnosis not present

## 2021-07-15 DIAGNOSIS — Z951 Presence of aortocoronary bypass graft: Secondary | ICD-10-CM | POA: Diagnosis not present

## 2021-07-15 DIAGNOSIS — K269 Duodenal ulcer, unspecified as acute or chronic, without hemorrhage or perforation: Secondary | ICD-10-CM | POA: Diagnosis not present

## 2021-07-15 DIAGNOSIS — R41841 Cognitive communication deficit: Secondary | ICD-10-CM | POA: Diagnosis not present

## 2021-07-15 DIAGNOSIS — Z8549 Personal history of malignant neoplasm of other male genital organs: Secondary | ICD-10-CM | POA: Diagnosis not present

## 2021-07-15 DIAGNOSIS — K264 Chronic or unspecified duodenal ulcer with hemorrhage: Secondary | ICD-10-CM | POA: Diagnosis not present

## 2021-07-15 DIAGNOSIS — E875 Hyperkalemia: Secondary | ICD-10-CM | POA: Diagnosis present

## 2021-07-15 DIAGNOSIS — D539 Nutritional anemia, unspecified: Secondary | ICD-10-CM | POA: Diagnosis not present

## 2021-07-15 DIAGNOSIS — R000 Tachycardia, unspecified: Secondary | ICD-10-CM | POA: Diagnosis not present

## 2021-07-15 DIAGNOSIS — D696 Thrombocytopenia, unspecified: Secondary | ICD-10-CM | POA: Diagnosis not present

## 2021-07-15 DIAGNOSIS — R6510 Systemic inflammatory response syndrome (SIRS) of non-infectious origin without acute organ dysfunction: Secondary | ICD-10-CM | POA: Diagnosis not present

## 2021-07-15 DIAGNOSIS — R269 Unspecified abnormalities of gait and mobility: Secondary | ICD-10-CM | POA: Diagnosis present

## 2021-07-15 DIAGNOSIS — J069 Acute upper respiratory infection, unspecified: Secondary | ICD-10-CM | POA: Diagnosis not present

## 2021-07-15 DIAGNOSIS — N390 Urinary tract infection, site not specified: Secondary | ICD-10-CM | POA: Diagnosis not present

## 2021-07-15 DIAGNOSIS — Z741 Need for assistance with personal care: Secondary | ICD-10-CM | POA: Diagnosis not present

## 2021-07-15 DIAGNOSIS — R279 Unspecified lack of coordination: Secondary | ICD-10-CM | POA: Diagnosis not present

## 2021-07-15 DIAGNOSIS — Z20818 Contact with and (suspected) exposure to other bacterial communicable diseases: Secondary | ICD-10-CM | POA: Diagnosis not present

## 2021-07-15 DIAGNOSIS — R1311 Dysphagia, oral phase: Secondary | ICD-10-CM | POA: Diagnosis not present

## 2021-07-15 DIAGNOSIS — Z87891 Personal history of nicotine dependence: Secondary | ICD-10-CM | POA: Diagnosis not present

## 2021-07-15 DIAGNOSIS — Z7902 Long term (current) use of antithrombotics/antiplatelets: Secondary | ICD-10-CM | POA: Diagnosis not present

## 2021-07-15 DIAGNOSIS — K5901 Slow transit constipation: Secondary | ICD-10-CM | POA: Diagnosis not present

## 2021-07-15 DIAGNOSIS — D689 Coagulation defect, unspecified: Secondary | ICD-10-CM | POA: Diagnosis not present

## 2021-07-15 DIAGNOSIS — I130 Hypertensive heart and chronic kidney disease with heart failure and stage 1 through stage 4 chronic kidney disease, or unspecified chronic kidney disease: Secondary | ICD-10-CM | POA: Diagnosis not present

## 2021-07-15 DIAGNOSIS — N319 Neuromuscular dysfunction of bladder, unspecified: Secondary | ICD-10-CM | POA: Diagnosis present

## 2021-07-15 DIAGNOSIS — D62 Acute posthemorrhagic anemia: Secondary | ICD-10-CM | POA: Diagnosis not present

## 2021-07-15 LAB — COMPREHENSIVE METABOLIC PANEL
ALT: 55 U/L — ABNORMAL HIGH (ref 0–44)
AST: 45 U/L — ABNORMAL HIGH (ref 15–41)
Albumin: 2.3 g/dL — ABNORMAL LOW (ref 3.5–5.0)
Alkaline Phosphatase: 38 U/L (ref 38–126)
Anion gap: 5 (ref 5–15)
BUN: 32 mg/dL — ABNORMAL HIGH (ref 8–23)
CO2: 22 mmol/L (ref 22–32)
Calcium: 8 mg/dL — ABNORMAL LOW (ref 8.9–10.3)
Chloride: 108 mmol/L (ref 98–111)
Creatinine, Ser: 1.22 mg/dL (ref 0.61–1.24)
GFR, Estimated: 55 mL/min — ABNORMAL LOW (ref >60)
Glucose, Bld: 100 mg/dL — ABNORMAL HIGH (ref 70–99)
Potassium: 4.5 mmol/L (ref 3.5–5.1)
Sodium: 135 mmol/L (ref 135–145)
Total Bilirubin: 0.5 mg/dL (ref 0.3–1.2)
Total Protein: 4.9 g/dL — ABNORMAL LOW (ref 6.5–8.1)

## 2021-07-15 LAB — CK: Total CK: 91 U/L (ref 49–397)

## 2021-07-15 LAB — RESP PANEL BY RT-PCR (FLU A&B, COVID) ARPGX2
Influenza A by PCR: NEGATIVE
Influenza B by PCR: NEGATIVE
SARS Coronavirus 2 by RT PCR: NEGATIVE

## 2021-07-15 LAB — MAGNESIUM: Magnesium: 1.9 mg/dL (ref 1.7–2.4)

## 2021-07-15 MED ORDER — FLUCONAZOLE 100 MG PO TABS: 100.0000 mg | ORAL_TABLET | Freq: Every day | ORAL | 0 refills | Status: DC

## 2021-07-15 MED ORDER — CEFDINIR 300 MG PO CAPS: 300.0000 mg | ORAL_CAPSULE | Freq: Two times a day (BID) | ORAL | 0 refills | Status: AC

## 2021-07-15 NOTE — TOC Transition Note (Signed)
Transition of Care [facility]) - CM/SW Discharge Note   Patient Details  Name: DAELEN BELVEDERE MRN: [id number] Date of Birth: 11/25/1925  Transition [facility]) CM/SW Contact:  Boneta Lucks, RN Phone Number: 07/15/2021, 1:31 PM   Clinical Narrative:  Patient is medically ready, bed offers discussed with Bill. They are choosing [facility].  Kitty provided room number and number for report. RN updated, med necessity printed. TOC will call EMS when RN is ready. Rush Landmark and another family member updated at the bedside.   Final next level of care: Skilled Nursing Facility Barriers to Discharge: Barriers Resolved  Patient Goals and CMS Choice Patient states their goals for this hospitalization and ongoing recovery are:: to go to SNF. CMS Medicare.gov Compare Post Acute Care list provided to:: Patient Represenative (must comment) Choice offered to / list presented to : Adult Children  Discharge Placement              Patient chooses bed at:  [facility]) Patient to be transferred to facility by: EMS Name of family member notified: Bill Patient and family notified of of transfer: 07/15/21     Readmission Risk Interventions Readmission Risk Prevention Plan 07/15/2021 07/14/2021  Post Dischage Appt Complete -  Medication Screening - Complete  Transportation Screening - Complete  Some recent data might be hidden

## 2021-07-15 NOTE — Discharge Summary (Signed)
Physician Discharge Summary  JERELL DEMERY HCW:[id number] DOB: 02/23/1925 DOA: 07/11/2021  PCP: Sharilyn Sites, MD  Admit date: 07/11/2021  Discharge date: 07/15/2021  Admitted From:Home  Disposition:  SNF  Recommendations for Outpatient Follow-up:  Follow up with PCP in 1-2 weeks Follow-up with urology for Foley catheter exchange, done at Dr. Noland Fordyce office this is due for 7/20 Continue Omnicef for 3 more days to complete 7-day course of treatment for bacterial UTI and continue Diflucan for 10 more days to complete total 14-day course of treatment for Candida UTI Continue other home medications as prior  Home Health: None  Equipment/Devices: None  Discharge Condition:Stable  CODE STATUS: Full  Diet recommendation: Heart Healthy  Brief/Interim Summary:  LYNCOLN LEDGERWOOD is a 87 y.o. male with a history of coronary artery disease, hyperlipidemia, obesity, history of penile cancer with neurogenic bladder and need for self-catheterization.  Patient had a fall on the floor 6 days ago in the bathroom.  Patient was admitted with fall at home with rhabdomyolysis as well as UTI.  He was noted to have some atrial fibrillation with RVR noted during this admission, but this became rate controlled after use of his home metoprolol.  His heart rate is better controlled and 2D echocardiogram demonstrates LVEF 65-70% and TSH 1.278.  He does not appear to be a candidate for anticoagulation given his advanced age and risk for falls.  PT has assessed the patient with need for SNF which has been arranged.  He is currently stable for discharge and will continue antibiotics as noted above for yeast as well as bacterial UTI.  He will need Foley catheter exchange as noted above.  Discharge Diagnoses:  Active Problems:   Coronary artery disease   Neurogenic bladder   History of penile cancer   Rhabdomyolysis   Fall at home, initial encounter   Acute lower UTI  Principal discharge diagnosis: Fall at  home with rhabdomyolysis.  UTI with bacteria and yeast.  Atrial fibrillation, question new onset with RVR-resolved.  Discharge Instructions  Discharge Instructions     Diet - low sodium heart healthy   Complete by: As directed    Increase activity slowly   Complete by: As directed       Allergies as of 07/15/2021   No Known Allergies      Medication List     STOP taking these medications    cefpodoxime 200 MG tablet Commonly known as: VANTIN   docusate sodium 100 MG capsule Commonly known as: COLACE   doxycycline 100 MG capsule Commonly known as: VIBRAMYCIN   furosemide 20 MG tablet Commonly known as: LASIX   magnesium hydroxide 400 MG/5ML suspension Commonly known as: MILK OF MAGNESIA   nitrofurantoin (macrocrystal-monohydrate) 100 MG capsule Commonly known as: MACROBID       TAKE these medications    acetaminophen 325 MG tablet Commonly known as: TYLENOL Take 1-2 tablets (325-650 mg total) by mouth every 6 (six) hours as needed for fever, headache, mild pain or moderate pain.   aspirin EC 81 MG tablet Take 81 mg by mouth daily.   CALCIUM-D PO Take 1 tablet by mouth daily.   cefdinir 300 MG capsule Commonly known as: OMNICEF Take 1 capsule (300 mg total) by mouth 2 (two) times daily for 3 days.   clopidogrel 75 MG tablet Commonly known as: PLAVIX TAKE 1 TABLET EVERY DAY   fluconazole 100 MG tablet Commonly known as: DIFLUCAN Take 1 tablet (100 mg total) by mouth daily for  10 days. Start taking on: July 16, 2021   metoprolol tartrate 25 MG tablet Commonly known as: LOPRESSOR TAKE 1 TABLET TWICE DAILY (NEED MD APPOINTMENT) What changed: See the new instructions.   multivitamin with minerals Tabs tablet Take 1 tablet by mouth daily. Centrum Silver   polyethylene glycol powder 17 GM/SCOOP powder Commonly known as: GLYCOLAX/MIRALAX Take 17 g by mouth daily as needed for mild constipation or moderate constipation.   PreserVision AREDS 2  Caps Take by mouth.   simvastatin 20 MG tablet Commonly known as: ZOCOR TAKE 1 TABLET AT BEDTIME   vitamin C 1000 MG tablet Take 1,000 mg by mouth daily.        Follow-up Information     Sharilyn Sites, MD. Schedule an appointment as soon as possible for a visit in 1 week(s).   Specialty: Family Medicine Contact information: [address] [place] [address] [phone]                No Known Allergies  Consultations: None   Procedures/Studies: DG Chest 1 View  Result Date: 07/11/2021 CLINICAL DATA:  Fall 6 days ago.  Chest and bilateral hip pain. EXAM: CHEST  1 VIEW COMPARISON:  Radiographs 01/14/2016 and 01/13/2016.  CT 12/20/2005. FINDINGS: 1317 hours. The heart size and mediastinal contours are stable with aortic atherosclerosis. The lungs appear clear. There is no pleural effusion or pneumothorax. There are old left-sided rib fractures and sequela of prior left AC joint injury with heterotopic ossification. No acute osseous findings evident. Telemetry leads overlie the chest. IMPRESSION: No acute cardiopulmonary process. Sequela of previous upper left chest trauma. Electronically Signed   By: Richardean Sale M.D.   On: 07/11/2021 13:54   CT Head Wo Contrast  Result Date: 07/11/2021 CLINICAL DATA:  Fall, head trauma EXAM: CT HEAD WITHOUT CONTRAST TECHNIQUE: Contiguous axial images were obtained from the base of the skull through the vertex without intravenous contrast. COMPARISON:  CT head 01/12/2016 FINDINGS: Brain: No acute intracranial hemorrhage. No focal mass lesion. No CT evidence of acute infarction. No midline shift or mass effect. No hydrocephalus. Basilar cisterns are patent. There are periventricular and subcortical white matter hypodensities. Generalized cortical atrophy. Vascular: No hyperdense vessel or unexpected calcification. Skull: Normal. Negative for fracture or focal lesion. Sinuses/Orbits: Paranasal sinuses and mastoid air cells are  clear. Orbits are clear. Other: None. IMPRESSION: 1. No intracranial trauma. 2. Advanced atrophy and white matter microvascular disease. Electronically Signed   By: Suzy Bouchard M.D.   On: 07/11/2021 14:38   ECHOCARDIOGRAM COMPLETE  Result Date: 07/12/2021    ECHOCARDIOGRAM REPORT   Patient Name:   KEVONTE VANECEK Date of Exam: 07/12/2021 Medical Rec #:  [id number]       Height:       70.0 in Accession #:    [id number]      Weight:       237.0 lb Date of Birth:  1925/10/11        BSA:          2.243 m Patient Age:    38 years        BP:           107/64 mmHg Patient Gender: M               HR:           112 bpm. Exam Location:  [facility] Procedure: 2D Echo, Cardiac Doppler and Color Doppler Indications:    I48.0 Paroxysmal atrial fibrillation  History:        Patient has prior history of Echocardiogram examinations, most                 recent 12/22/2005. CAD; Risk Factors:Hypertension and                 Dyslipidemia. Cancer.  Sonographer:    Jonelle Sidle Dance Referring Phys: [id number] [place]  1. Left ventricular ejection fraction, by estimation, is 65 to 70%. The left ventricle has hyperdynamic function. The left ventricle has no regional wall motion abnormalities. There is mild concentric left ventricular hypertrophy. Left ventricular diastolic function could not be evaluated.  2. Right ventricular systolic function is normal. The right ventricular size is normal. There is normal pulmonary artery systolic pressure.  3. Left atrial size was moderately dilated.  4. Right atrial size was mildly dilated.  5. The mitral valve is normal in structure. No evidence of mitral valve regurgitation.  6. The aortic valve is tricuspid. There is moderate calcification of the aortic valve. There is moderate thickening of the aortic valve. Aortic valve regurgitation is not visualized. Mild to moderate aortic valve stenosis.  7. Aortic dilatation noted. There is mild dilatation of the aortic root, measuring 40  mm. There is mild dilatation of the ascending aorta, measuring 39 mm.  8. The inferior vena cava is normal in size with greater than 50% respiratory variability, suggesting right atrial pressure of 3 mmHg. FINDINGS  Left Ventricle: Left ventricular ejection fraction, by estimation, is 65 to 70%. The left ventricle has hyperdynamic function. The left ventricle has no regional wall motion abnormalities. The left ventricular internal cavity size was normal in size. There is mild concentric left ventricular hypertrophy. Left ventricular diastolic function could not be evaluated due to atrial fibrillation. Left ventricular diastolic function could not be evaluated. Right Ventricle: The right ventricular size is normal. No increase in right ventricular wall thickness. Right ventricular systolic function is normal. There is normal pulmonary artery systolic pressure. The tricuspid regurgitant velocity is 2.72 m/s, and  with an assumed right atrial pressure of 3 mmHg, the estimated right ventricular systolic pressure is 24.5 mmHg. Left Atrium: Left atrial size was moderately dilated. Right Atrium: Right atrial size was mildly dilated. Pericardium: There is no evidence of pericardial effusion. Mitral Valve: The mitral valve is normal in structure. Mild to moderate mitral annular calcification. No evidence of mitral valve regurgitation. Tricuspid Valve: The tricuspid valve is normal in structure. Tricuspid valve regurgitation is trivial. Aortic Valve: The aortic valve is tricuspid. There is moderate calcification of the aortic valve. There is moderate thickening of the aortic valve. Aortic valve regurgitation is not visualized. Mild to moderate aortic stenosis is present. Aortic valve mean gradient measures 14.8 mmHg. Aortic valve peak gradient measures 28.6 mmHg. Aortic valve area, by VTI measures 1.35 cm. Pulmonic Valve: The pulmonic valve was not well visualized. Pulmonic valve regurgitation is not visualized. Aorta:  Aortic dilatation noted. There is mild dilatation of the aortic root, measuring 40 mm. There is mild dilatation of the ascending aorta, measuring 39 mm. Venous: The inferior vena cava is normal in size with greater than 50% respiratory variability, suggesting right atrial pressure of 3 mmHg. IAS/Shunts: No atrial level shunt detected by color flow Doppler.  LEFT VENTRICLE PLAX 2D LVIDd:         3.08 cm LVIDs:         2.98 cm LV PW:         1.36  cm LV IVS:        1.24 cm LVOT diam:     2.10 cm LV SV:         57 LV SV Index:   26 LVOT Area:     3.46 cm  RIGHT VENTRICLE RV Basal diam:  3.21 cm TAPSE (M-mode): 1.7 cm LEFT ATRIUM             Index       RIGHT ATRIUM           Index LA diam:        4.60 cm 2.05 cm/m  RA Area:     16.30 cm LA Vol (A2C):   74.8 ml 33.34 ml/m RA Volume:   36.70 ml  16.36 ml/m LA Vol (A4C):   73.9 ml 32.94 ml/m LA Biplane Vol: 79.1 ml 35.26 ml/m  AORTIC VALVE AV Area (Vmax):    1.26 cm AV Area (Vmean):   1.29 cm AV Area (VTI):     1.35 cm AV Vmax:           267.50 cm/s AV Vmean:          178.500 cm/s AV VTI:            0.427 m AV Peak Grad:      28.6 mmHg AV Mean Grad:      14.8 mmHg LVOT Vmax:         97.55 cm/s LVOT Vmean:        66.650 cm/s LVOT VTI:          0.166 m LVOT/AV VTI ratio: 0.39  AORTA Ao Root diam: 4.00 cm Ao Asc diam:  3.90 cm MITRAL VALVE               TRICUSPID VALVE MV Area (PHT): 2.81 cm    TR Peak grad:   29.6 mmHg MV Decel Time: 270 msec    TR Vmax:        272.00 cm/s MV E velocity: 99.90 cm/s                            SHUNTS                            Systemic VTI:  0.17 m                            Systemic Diam: 2.10 cm Dani Gobble Croitoru MD Electronically signed by Sanda Klein MD Signature Date/Time: 07/12/2021/5:32:23 PM    Final    DG Hips Bilat W or Wo Pelvis 3-4 Views  Result Date: 07/11/2021 CLINICAL DATA:  Bilateral hip pain since falling 6 days ago. EXAM: DG HIP (WITH OR WITHOUT PELVIS) 3-4V BILAT COMPARISON:  Radiographs 05/09/2015. FINDINGS:  The bones appear demineralized. There is no evidence of acute fracture, dislocation or bone destruction. The hip joint spaces are preserved. There are mild degenerative changes in the lower lumbar spine and sacroiliac joints. There are surgical clips in both groins and scattered vascular calcifications. IMPRESSION: No evidence of acute fracture, dislocation or bone destruction. Scattered vascular calcifications. Electronically Signed   By: Richardean Sale M.D.   On: 07/11/2021 13:56     Discharge Exam: Vitals:   07/14/21 2000 07/15/21 0427  BP: 124/68 118/67  Pulse: (!) 58 (!) 107  Resp: 18 19  Temp: 98.6 F (  37 C) 98 F (36.7 C)  SpO2: 98% 95%   Vitals:   07/14/21 0939 07/14/21 1403 07/14/21 2000 07/15/21 0427  BP: (!) 153/74 121/69 124/68 118/67  Pulse: (!) 51 (!) 55 (!) 58 (!) 107  Resp:  18 18 19   Temp:  98.2 F (36.8 C) 98.6 F (37 C) 98 F (36.7 C)  TempSrc:  Oral Oral   SpO2: 97% 97% 98% 95%  Weight:      Height:        General: Pt is alert, awake, not in acute distress Cardiovascular: RRR, S1/S2 +, no rubs, no gallops Respiratory: CTA bilaterally, no wheezing, no rhonchi Abdominal: Soft, NT, ND, bowel sounds + Extremities: no edema, no cyanosis    The results of significant diagnostics from this hospitalization (including imaging, microbiology, ancillary and laboratory) are listed below for reference.     Microbiology: Recent Results (from the past 240 hour(s))  Urine Culture     Status: Abnormal   Collection Time: 07/11/21 12:59 PM   Specimen: Urine, Catheterized  Result Value Ref Range Status   Specimen Description   Final    URINE, CATHETERIZED Performed at [facility], [address] [place], [place] [address]    Special Requests   Final    NONE Performed at [facility], [address], [place], [place] [address]    Culture MULTIPLE SPECIES PRESENT, SUGGEST RECOLLECTION (A)  Final   Report Status 07/12/2021 FINAL  Final  Resp Panel by RT-PCR (Flu  A&B, Covid) Nasopharyngeal Swab     Status: None   Collection Time: 07/11/21  5:47 PM   Specimen: Nasopharyngeal Swab; Nasopharyngeal(NP) swabs in vial transport medium  Result Value Ref Range Status   SARS Coronavirus 2 by RT PCR NEGATIVE NEGATIVE Final    Comment: (NOTE) SARS-CoV-2 target nucleic acids are NOT DETECTED.  The SARS-CoV-2 RNA is generally detectable in upper respiratory specimens during the acute phase of infection. The lowest concentration of SARS-CoV-2 viral copies this assay can detect is 138 copies/mL. A negative result does not preclude SARS-Cov-2 infection and should not be used as the sole basis for treatment or other patient management decisions. A negative result may occur with  improper specimen collection/handling, submission of specimen other than nasopharyngeal swab, presence of viral mutation(s) within the areas targeted by this assay, and inadequate number of viral copies(<138 copies/mL). A negative result must be combined with clinical observations, patient history, and epidemiological information. The expected result is Negative.  Fact Sheet for Patients:  EntrepreneurPulse.com.au  Fact Sheet for Healthcare Providers:  IncredibleEmployment.be  This test is no t yet approved or cleared by the [place] FDA and  has been authorized for detection and/or diagnosis of SARS-CoV-2 by FDA under an Emergency Use Authorization (EUA). This EUA will remain  in effect (meaning this test can be used) for the duration of the COVID-19 declaration under Section 564(b)(1) of the Act, 21 U.S.C.section 360bbb-3(b)(1), unless the authorization is terminated  or revoked sooner.       Influenza A by PCR NEGATIVE NEGATIVE Final   Influenza B by PCR NEGATIVE NEGATIVE Final    Comment: (NOTE) The Xpert Xpress SARS-CoV-2/FLU/RSV plus assay is intended as an aid in the diagnosis of influenza from Nasopharyngeal swab specimens  and should not be used as a sole basis for treatment. Nasal washings and aspirates are unacceptable for Xpert Xpress SARS-CoV-2/FLU/RSV testing.  Fact Sheet for Patients: EntrepreneurPulse.com.au  Fact Sheet for Healthcare Providers: IncredibleEmployment.be  This test is not  yet approved or cleared by the [place] and has been authorized for detection and/or diagnosis of SARS-CoV-2 by FDA under an Emergency Use Authorization (EUA). This EUA will remain in effect (meaning this test can be used) for the duration of the COVID-19 declaration under Section 564(b)(1) of the Act, 21 U.S.C. section 360bbb-3(b)(1), unless the authorization is terminated or revoked.  Performed at [facility], [address] [place], [place] [address]   Urine Culture     Status: None (Preliminary result)   Collection Time: 07/13/21 10:30 AM   Specimen: Urine, Clean Catch  Result Value Ref Range Status   Specimen Description   Final    URINE, CLEAN CATCH Performed at [facility], [address], [place], [place] [address]    Special Requests   Final    NONE Performed at [facility], [address], [place], [place] [address]    Culture   Final    CULTURE REINCUBATED FOR BETTER GROWTH Performed at [facility], [place] [address], [place], [place] [address]    Report Status PENDING  Incomplete     Labs: BNP (last 3 results) No results for input(s): BNP in the last 8760 hours. Basic Metabolic Panel: Recent Labs  Lab 07/11/21 1316 07/12/21 0412 07/13/21 0225 07/14/21 0523 07/15/21 0441  NA 137 136 136 136 135  K 4.6 4.0 4.1 4.5 4.5  CL 105 107 110 108 108  CO2 24 24 21* 23 22  GLUCOSE 133* 129* 105* 99 100*  BUN 63* 54* 47* 35* 32*  CREATININE 1.39* 1.32* 1.26* 1.23 1.22  CALCIUM 9.2 8.5* 7.8* 8.2* 8.0*  MG 2.4  --  2.1 2.1 1.9   Liver Function Tests: Recent Labs  Lab 07/11/21 1316 07/12/21 0412 07/13/21 0225 07/14/21 0523  07/15/21 0441  AST 91* 67* 49* 46* 45*  ALT 80* 63* 52* 54* 55*  ALKPHOS 59 49 40 42 38  BILITOT 1.6* 0.8 0.5 0.5 0.5  PROT 6.3* 5.7* 4.9* 5.2* 4.9*  ALBUMIN 3.4* 2.9* 2.4* 2.4* 2.3*   No results for input(s): LIPASE, AMYLASE in the last 168 hours. No results for input(s): AMMONIA in the last 168 hours. CBC: Recent Labs  Lab 07/11/21 1316 07/12/21 0412 07/13/21 0225 07/14/21 0523  WBC 13.9* 9.6 8.0 6.7  NEUTROABS 11.9*  --   --   --   HGB 11.8* 11.0* 9.3* 10.0*  HCT 35.7* 32.9* 29.4* 31.4*  MCV 98.1 97.9 100.3* 103.0*  PLT 218 204 185 209   Cardiac Enzymes: Recent Labs  Lab 07/11/21 1316 07/12/21 0412 07/13/21 0225 07/14/21 0523 07/15/21 0441  CKTOTAL 897* 653* 393 150 91   BNP: Invalid input(s): POCBNP CBG: No results for input(s): GLUCAP in the last 168 hours. D-Dimer No results for input(s): DDIMER in the last 72 hours. Hgb A1c No results for input(s): HGBA1C in the last 72 hours. Lipid Profile No results for input(s): CHOL, HDL, LDLCALC, TRIG, CHOLHDL, LDLDIRECT in the last 72 hours. Thyroid function studies No results for input(s): TSH, T4TOTAL, T3FREE, THYROIDAB in the last 72 hours.  Invalid input(s): FREET3 Anemia work up No results for input(s): VITAMINB12, FOLATE, FERRITIN, TIBC, IRON, RETICCTPCT in the last 72 hours. Urinalysis    Component Value Date/Time   COLORURINE YELLOW 07/11/2021 1259   APPEARANCEUR CLOUDY (A) 07/11/2021 1259   LABSPEC 1.019 07/11/2021 1259   PHURINE 7.0 07/11/2021 1259   GLUCOSEU NEGATIVE 07/11/2021 1259   HGBUR SMALL (A) 07/11/2021 1259   BILIRUBINUR NEGATIVE 07/11/2021  [place] negative 03/25/2020 1156   KETONESUR 20 (A) 07/11/2021 1259   PROTEINUR 30 (A) 07/11/2021 1259   UROBILINOGEN 0.2 03/25/2020 1156   NITRITE NEGATIVE 07/11/2021 1259   LEUKOCYTESUR MODERATE (A) 07/11/2021 1259   Sepsis Labs Invalid input(s): PROCALCITONIN,  WBC,  LACTICIDVEN Microbiology Recent Results (from the past 240  hour(s))  Urine Culture     Status: Abnormal   Collection Time: 07/11/21 12:59 PM   Specimen: Urine, Catheterized  Result Value Ref Range Status   Specimen Description   Final    URINE, CATHETERIZED Performed at [facility], [address], [place], [place] [address]    Special Requests   Final    NONE Performed at [facility], [address] [place], [place] [address]    Culture MULTIPLE SPECIES PRESENT, SUGGEST RECOLLECTION (A)  Final   Report Status 07/12/2021 FINAL  Final  Resp Panel by RT-PCR (Flu A&B, Covid) Nasopharyngeal Swab     Status: None   Collection Time: 07/11/21  5:47 PM   Specimen: Nasopharyngeal Swab; Nasopharyngeal(NP) swabs in vial transport medium  Result Value Ref Range Status   SARS Coronavirus 2 by RT PCR NEGATIVE NEGATIVE Final    Comment: (NOTE) SARS-CoV-2 target nucleic acids are NOT DETECTED.  The SARS-CoV-2 RNA is generally detectable in upper respiratory specimens during the acute phase of infection. The lowest concentration of SARS-CoV-2 viral copies this assay can detect is 138 copies/mL. A negative result does not preclude SARS-Cov-2 infection and should not be used as the sole basis for treatment or other patient management decisions. A negative result may occur with  improper specimen collection/handling, submission of specimen other than nasopharyngeal swab, presence of viral mutation(s) within the areas targeted by this assay, and inadequate number of viral copies(<138 copies/mL). A negative result must be combined with clinical observations, patient history, and epidemiological information. The expected result is Negative.  Fact Sheet for Patients:  EntrepreneurPulse.com.au  Fact Sheet for Healthcare Providers:  IncredibleEmployment.be  This test is no t yet approved or cleared by the [place] FDA and  has been authorized for detection and/or diagnosis of SARS-CoV-2 by FDA under an Emergency Use  Authorization (EUA). This EUA will remain  in effect (meaning this test can be used) for the duration of the COVID-19 declaration under Section 564(b)(1) of the Act, 21 U.S.C.section 360bbb-3(b)(1), unless the authorization is terminated  or revoked sooner.       Influenza A by PCR NEGATIVE NEGATIVE Final   Influenza B by PCR NEGATIVE NEGATIVE Final    Comment: (NOTE) The Xpert Xpress SARS-CoV-2/FLU/RSV plus assay is intended as an aid in the diagnosis of influenza from Nasopharyngeal swab specimens and should not be used as a sole basis for treatment. Nasal washings and aspirates are unacceptable for Xpert Xpress SARS-CoV-2/FLU/RSV testing.  Fact Sheet for Patients: EntrepreneurPulse.com.au  Fact Sheet for Healthcare Providers: IncredibleEmployment.be  This test is not yet approved or cleared by the [place] FDA and has been authorized for detection and/or diagnosis of SARS-CoV-2 by FDA under an Emergency Use Authorization (EUA). This EUA will remain in effect (meaning this test can be used) for the duration of the COVID-19 declaration under Section 564(b)(1) of the Act, 21 U.S.C. section 360bbb-3(b)(1), unless the authorization is terminated or revoked.  Performed at [facility], [address], [place],  [address]   Urine Culture     Status: None (Preliminary result)   Collection Time: 07/13/21 10:30 AM   Specimen: Urine, Clean  Catch  Result Value Ref Range Status   Specimen Description   Final    URINE, CLEAN CATCH Performed at [facility], [address], [place], [place] [address]    Special Requests   Final    NONE Performed at [facility], [address], [place], [place] [address]    Culture   Final    CULTURE REINCUBATED FOR BETTER GROWTH Performed at [facility], [place] [address] [place], [place] [address]    Report Status PENDING  Incomplete     Time coordinating discharge: 35  minutes  SIGNED:   Rodena Goldmann, DO [facility] 07/15/2021, 10:27 AM  If 7PM-7AM, please contact night-coverage [email or web address]

## 2021-07-15 NOTE — Care Management Important Message (Signed)
Important Message  Patient Details  Name: Caleb [name] MRN: [id number] Date of Birth: November 18, 1925   Medicare Important Message Given:  Yes     Tommy Medal 07/15/2021, 11:10 AM

## 2021-07-16 ENCOUNTER — Ambulatory Visit: Payer: Medicare Other | Admitting: Podiatry

## 2021-07-16 ENCOUNTER — Non-Acute Institutional Stay (SKILLED_NURSING_FACILITY): Payer: Medicare Other | Admitting: Adult Health

## 2021-07-16 ENCOUNTER — Encounter: Payer: Self-pay | Admitting: Adult Health

## 2021-07-16 DIAGNOSIS — K5901 Slow transit constipation: Secondary | ICD-10-CM

## 2021-07-16 DIAGNOSIS — Z8549 Personal history of malignant neoplasm of other male genital organs: Secondary | ICD-10-CM

## 2021-07-16 DIAGNOSIS — N390 Urinary tract infection, site not specified: Secondary | ICD-10-CM | POA: Diagnosis not present

## 2021-07-16 DIAGNOSIS — I4891 Unspecified atrial fibrillation: Secondary | ICD-10-CM | POA: Diagnosis not present

## 2021-07-16 DIAGNOSIS — R5381 Other malaise: Secondary | ICD-10-CM

## 2021-07-16 DIAGNOSIS — M6282 Rhabdomyolysis: Secondary | ICD-10-CM | POA: Diagnosis not present

## 2021-07-16 DIAGNOSIS — Z23 Encounter for immunization: Secondary | ICD-10-CM

## 2021-07-16 DIAGNOSIS — I2510 Atherosclerotic heart disease of native coronary artery without angina pectoris: Secondary | ICD-10-CM

## 2021-07-16 NOTE — Progress Notes (Signed)
Location:  [facility] Room Number: 268 A Place of Service:  SNF (31) Provider:  Durenda Age, DNP, FNP-BC  Patient Care Team: Sharilyn Sites, MD as PCP - General (Family Medicine)  Extended Emergency Contact Information Primary Emergency Contact: Natale Lay, [place] Phone: [phone] Mobile Phone: [phone] Relation: Son  Code Status:  FULL CODE  Goals of care: Advanced Directive information Advanced Directives 07/16/2021  Does Patient Have a Medical Advance Directive? No  Type of Advance Directive -  Does patient want to make changes to medical advance directive? -  Copy of [place] in Chart? -  Would patient like information on creating a medical advance directive? No - Patient declined     Chief Complaint  Patient presents with   Hospitalization Follow-up    Zoster vaccine, Pneumonia vaccine    HPI:  Pt is a 87 y.o. male who was admitted to [facility] on 07/17/2019 post hospitalization 07/11/2021 to 07/15/2021.  He has a PMH of coronary artery disease, hyperlipidemia, obesity, history of penile cancer with neurogenic bladder and need for self-catheterization.  He had a fall at home in the bathroom and was unable to get up x6 days.  He lives alone and stated that he screamed but nobody heard him. The postal worker realized that he was not picking up his mail, and called the sheriff for a welfare check.  He was admitted to the hospital and was noted to have atrial fibrillation with RVR which was controlled after use of his home metoprolol.  2D echocardiogram demonstrated LVEF 65 to 70% and TSH 1.278.  He is not a candidate for anticoagulation due to advanced age and risk for falls.  He was diagnosed urinary tract infection with yeast infection as well.  He was discharged on cefdinir x3 more days.  Patient was seen in his room today.  He complained of  constipation. He has  chronic foley catheter due to urinary retention. He had radical penectomy done on 02/04/15 due to penile cancer.   Past Medical History:  Diagnosis Date   Coronary artery disease    Hyperlipidemia    Hyperlipidemia    Obesity    Penile cancer (HCC)    Swelling of extremity    Left Leg   Past Surgical History:  Procedure Laterality Date   APPENDECTOMY     BACK SURGERY     CHOLECYSTECTOMY     CORONARY ANGIOPLASTY  11/2005   RCA PCI AND STENTING WITH A CYPHER DES   CORONARY ARTERY BYPASS GRAFT     HIATAL HERNIA REPAIR     lymph node removal     NM MYOCAR MULTIPLE W/SPECT  11/26/2009   EF 62%. NORMAL MYOCARDIAL PERFUSION STUDY.   TRANSTHORACIC ECHOCARDIOGRAM  12/22/2005   MILD TO MOD AORTIC SCLEROSIS W/O STENOSIS. MILD TO MODERATE MITRAL CALCIFICATION. LA- MILDLY DILATED.    No Known Allergies  Outpatient Encounter Medications as of 07/16/2021  Medication Sig   acetaminophen (TYLENOL) 325 MG tablet Take 1-2 tablets (325-650 mg total) by mouth every 6 (six) hours as needed for fever, headache, mild pain or moderate pain.   Ascorbic Acid (VITAMIN C) 1000 MG tablet Take 1,000 mg by mouth daily.    aspirin EC 81 MG tablet Take 81 mg by mouth daily.   Calcium Carbonate-Vitamin D (CALCIUM-D PO) Take 1 tablet by mouth daily.   cefdinir (OMNICEF) 300  MG capsule Take 1 capsule (300 mg total) by mouth 2 (two) times daily for 3 days.   clopidogrel (PLAVIX) 75 MG tablet TAKE 1 TABLET EVERY DAY   fluconazole (DIFLUCAN) 100 MG tablet Take 1 tablet (100 mg total) by mouth daily for 10 days.   metoprolol tartrate (LOPRESSOR) 25 MG tablet TAKE 1 TABLET TWICE DAILY (NEED MD APPOINTMENT)   Multiple Vitamin (MULTIVITAMIN WITH MINERALS) TABS tablet Take 1 tablet by mouth daily. Centrum Silver   Multiple Vitamins-Minerals (PRESERVISION AREDS 2) CAPS Take by mouth.   polyethylene glycol powder (GLYCOLAX/MIRALAX) powder Take 17 g by mouth daily as needed for mild constipation or  moderate constipation.   simvastatin (ZOCOR) 20 MG tablet TAKE 1 TABLET AT BEDTIME   No facility-administered encounter medications on file as of 07/16/2021.    Review of Systems  GENERAL: No change in appetite, no fatigue, no weight changes, no fever or chills  MOUTH and THROAT: Denies oral discomfort, gingival pain or bleeding, RESPIRATORY: no cough, SOB, DOE, wheezing, hemoptysis CARDIAC: No chest pain, edema or palpitations GI: No abdominal pain, diarrhea, +constipation GU: Denies discharge NEUROLOGICAL: Denies dizziness, syncope, numbness, or headache PSYCHIATRIC: Denies feelings of depression or anxiety. No report of hallucinations, insomnia, paranoia, or agitation   Immunization History  Administered Date(s) Administered   H1N1 10/30/2015   Influenza-Unspecified 10/28/2014, 09/27/2018   Tdap 01/12/2016   Pertinent  Health Maintenance Due  Topic Date Due   PNA vac Low Risk Adult (1 of 2 - PCV13) Never done   INFLUENZA VACCINE  07/28/2021   No flowsheet data found.   Vitals:   07/16/21 1511  BP: 114/75  Pulse: 68  Resp: 18  Temp: (!) 97.2 F (36.2 C)  Weight: 234 lb 9.6 oz (106.4 kg)  Height: 5\' 10"  (1.778 m)   Body mass index is 33.66 kg/m.  Physical Exam  GENERAL APPEARANCE: Well nourished. In no acute distress. Obese. SKIN:  Skin is warm and dry.  MOUTH and THROAT: Lips are without lesions. Oral mucosa is moist and without lesions. Tongue is normal in shape, size, and color and without lesions RESPIRATORY: Breathing is even & unlabored, BS CTAB CARDIAC: RRR, no murmur,no extra heart sounds, no edema GI: Abdomen soft, normal BS, no masses, no tenderness GU:  Has foley catheter Fr. 16 NEUROLOGICAL: There is no tremor. Speech is clear. Alert and oriented X 3. PSYCHIATRIC:  Affect and behavior are appropriate  Labs reviewed: Recent Labs    07/13/21 0225 07/14/21 0523 07/15/21 0441  NA 136 136 135  K 4.1 4.5 4.5  CL 110 108 108  CO2 21* 23 22   GLUCOSE 105* 99 100*  BUN 47* 35* 32*  CREATININE 1.26* 1.23 1.22  CALCIUM 7.8* 8.2* 8.0*  MG 2.1 2.1 1.9   Recent Labs    07/13/21 0225 07/14/21 0523 07/15/21 0441  AST 49* 46* 45*  ALT 52* 54* 55*  ALKPHOS 40 42 38  BILITOT 0.5 0.5 0.5  PROT 4.9* 5.2* 4.9*  ALBUMIN 2.4* 2.4* 2.3*   Recent Labs    07/11/21 1316 07/12/21 0412 07/13/21 0225 07/14/21 0523  WBC 13.9* 9.6 8.0 6.7  NEUTROABS 11.9*  --   --   --   HGB 11.8* 11.0* 9.3* 10.0*  HCT 35.7* 32.9* 29.4* 31.4*  MCV 98.1 97.9 100.3* 103.0*  PLT 218 204 185 209   Lab Results  Component Value Date   TSH 1.278 07/11/2021   No results found for: HGBA1C Lab Results  Component  Value Date   CHOL 121 06/15/2014   HDL 42 06/15/2014   LDLCALC 67 06/15/2014   TRIG 59 06/15/2014   CHOLHDL 2.9 06/15/2014    Significant Diagnostic Results in last 30 days:  DG Chest 1 View  Result Date: 07/11/2021 CLINICAL DATA:  Fall 6 days ago.  Chest and bilateral hip pain. EXAM: CHEST  1 VIEW COMPARISON:  Radiographs 01/14/2016 and 01/13/2016.  CT 12/20/2005. FINDINGS: 1317 hours. The heart size and mediastinal contours are stable with aortic atherosclerosis. The lungs appear clear. There is no pleural effusion or pneumothorax. There are old left-sided rib fractures and sequela of prior left AC joint injury with heterotopic ossification. No acute osseous findings evident. Telemetry leads overlie the chest. IMPRESSION: No acute cardiopulmonary process. Sequela of previous upper left chest trauma. Electronically Signed   By: Richardean Sale M.D.   On: 07/11/2021 13:54   CT Head Wo Contrast  Result Date: 07/11/2021 CLINICAL DATA:  Fall, head trauma EXAM: CT HEAD WITHOUT CONTRAST TECHNIQUE: Contiguous axial images were obtained from the base of the skull through the vertex without intravenous contrast. COMPARISON:  CT head 01/12/2016 FINDINGS: Brain: No acute intracranial hemorrhage. No focal mass lesion. No CT evidence of acute infarction.  No midline shift or mass effect. No hydrocephalus. Basilar cisterns are patent. There are periventricular and subcortical white matter hypodensities. Generalized cortical atrophy. Vascular: No hyperdense vessel or unexpected calcification. Skull: Normal. Negative for fracture or focal lesion. Sinuses/Orbits: Paranasal sinuses and mastoid air cells are clear. Orbits are clear. Other: None. IMPRESSION: 1. No intracranial trauma. 2. Advanced atrophy and white matter microvascular disease. Electronically Signed   By: Suzy Bouchard M.D.   On: 07/11/2021 14:38   ECHOCARDIOGRAM COMPLETE  Result Date: 07/12/2021    ECHOCARDIOGRAM REPORT   Patient Name:   Caleb [name] Date of Exam: 07/12/2021 Medical Rec #:  [id number]       Height:       70.0 in Accession #:    [id number]      Weight:       237.0 lb Date of Birth:  Nov 28, 1925        BSA:          2.243 m Patient Age:    89 years        BP:           107/64 mmHg Patient Gender: M               HR:           112 bpm. Exam Location:  [facility] Procedure: 2D Echo, Cardiac Doppler and Color Doppler Indications:    I48.0 Paroxysmal atrial fibrillation  History:        Patient has prior history of Echocardiogram examinations, most                 recent 12/22/2005. CAD; Risk Factors:Hypertension and                 Dyslipidemia. Cancer.  Sonographer:    Jonelle Sidle Dance Referring Phys: [id number] [place] D [place]  1. Left ventricular ejection fraction, by estimation, is 65 to 70%. The left ventricle has hyperdynamic function. The left ventricle has no regional wall motion abnormalities. There is mild concentric left ventricular hypertrophy. Left ventricular diastolic function could not be evaluated.  2. Right ventricular systolic function is normal. The right ventricular size is normal. There is normal pulmonary artery systolic pressure.  3. Left atrial size was moderately  dilated.  4. Right atrial size was mildly dilated.  5. The mitral valve is normal in structure. No  evidence of mitral valve regurgitation.  6. The aortic valve is tricuspid. There is moderate calcification of the aortic valve. There is moderate thickening of the aortic valve. Aortic valve regurgitation is not visualized. Mild to moderate aortic valve stenosis.  7. Aortic dilatation noted. There is mild dilatation of the aortic root, measuring 40 mm. There is mild dilatation of the ascending aorta, measuring 39 mm.  8. The inferior vena cava is normal in size with greater than 50% respiratory variability, suggesting right atrial pressure of 3 mmHg. FINDINGS  Left Ventricle: Left ventricular ejection fraction, by estimation, is 65 to 70%. The left ventricle has hyperdynamic function. The left ventricle has no regional wall motion abnormalities. The left ventricular internal cavity size was normal in size. There is mild concentric left ventricular hypertrophy. Left ventricular diastolic function could not be evaluated due to atrial fibrillation. Left ventricular diastolic function could not be evaluated. Right Ventricle: The right ventricular size is normal. No increase in right ventricular wall thickness. Right ventricular systolic function is normal. There is normal pulmonary artery systolic pressure. The tricuspid regurgitant velocity is 2.72 m/s, and  with an assumed right atrial pressure of 3 mmHg, the estimated right ventricular systolic pressure is 77.4 mmHg. Left Atrium: Left atrial size was moderately dilated. Right Atrium: Right atrial size was mildly dilated. Pericardium: There is no evidence of pericardial effusion. Mitral Valve: The mitral valve is normal in structure. Mild to moderate mitral annular calcification. No evidence of mitral valve regurgitation. Tricuspid Valve: The tricuspid valve is normal in structure. Tricuspid valve regurgitation is trivial. Aortic Valve: The aortic valve is tricuspid. There is moderate calcification of the aortic valve. There is moderate thickening of the aortic valve.  Aortic valve regurgitation is not visualized. Mild to moderate aortic stenosis is present. Aortic valve mean gradient measures 14.8 mmHg. Aortic valve peak gradient measures 28.6 mmHg. Aortic valve area, by VTI measures 1.35 cm. Pulmonic Valve: The pulmonic valve was not well visualized. Pulmonic valve regurgitation is not visualized. Aorta: Aortic dilatation noted. There is mild dilatation of the aortic root, measuring 40 mm. There is mild dilatation of the ascending aorta, measuring 39 mm. Venous: The inferior vena cava is normal in size with greater than 50% respiratory variability, suggesting right atrial pressure of 3 mmHg. IAS/Shunts: No atrial level shunt detected by color flow Doppler.  LEFT VENTRICLE PLAX 2D LVIDd:         3.08 cm LVIDs:         2.98 cm LV PW:         1.36 cm LV IVS:        1.24 cm LVOT diam:     2.10 cm LV SV:         57 LV SV Index:   26 LVOT Area:     3.46 cm  RIGHT VENTRICLE RV Basal diam:  3.21 cm TAPSE (M-mode): 1.7 cm LEFT ATRIUM             Index       RIGHT ATRIUM           Index LA diam:        4.60 cm 2.05 cm/m  RA Area:     16.30 cm LA Vol (A2C):   74.8 ml 33.34 ml/m RA Volume:   36.70 ml  16.36 ml/m LA Vol (A4C):   73.9 ml 32.94  ml/m LA Biplane Vol: 79.1 ml 35.26 ml/m  AORTIC VALVE AV Area (Vmax):    1.26 cm AV Area (Vmean):   1.29 cm AV Area (VTI):     1.35 cm AV Vmax:           267.50 cm/s AV Vmean:          178.500 cm/s AV VTI:            0.427 m AV Peak Grad:      28.6 mmHg AV Mean Grad:      14.8 mmHg LVOT Vmax:         97.55 cm/s LVOT Vmean:        66.650 cm/s LVOT VTI:          0.166 m LVOT/AV VTI ratio: 0.39  AORTA Ao Root diam: 4.00 cm Ao Asc diam:  3.90 cm MITRAL VALVE               TRICUSPID VALVE MV Area (PHT): 2.81 cm    TR Peak grad:   29.6 mmHg MV Decel Time: 270 msec    TR Vmax:        272.00 cm/s MV E velocity: 99.90 cm/s                            SHUNTS                            Systemic VTI:  0.17 m                            Systemic Diam: 2.10  cm Dani Gobble Croitoru MD Electronically signed by Sanda Klein MD Signature Date/Time: 07/12/2021/5:32:23 PM    Final    DG Hips Bilat W or Wo Pelvis 3-4 Views  Result Date: 07/11/2021 CLINICAL DATA:  Bilateral hip pain since falling 6 days ago. EXAM: DG HIP (WITH OR WITHOUT PELVIS) 3-4V BILAT COMPARISON:  Radiographs 05/09/2015. FINDINGS: The bones appear demineralized. There is no evidence of acute fracture, dislocation or bone destruction. The hip joint spaces are preserved. There are mild degenerative changes in the lower lumbar spine and sacroiliac joints. There are surgical clips in both groins and scattered vascular calcifications. IMPRESSION: No evidence of acute fracture, dislocation or bone destruction. Scattered vascular calcifications. Electronically Signed   By: Richardean Sale M.D.   On: 07/11/2021 13:56    Assessment/Plan  1. Acute lower UTI -   Urine culture >= 100,000 E./Enterococcus faecalis -    continue fluconazole 100 mg daily x10 days and cefdinir 300 mg twice a day x3 days  2. Physical deconditioning -   For PT and OT, for therapeutic strengthening exercises -   Fall precautions  3. Coronary artery disease involving native coronary artery of native heart without angina pectoris -   Denies chest pain, continue Plavix 75 mg daily and simvastatin 20 mg at bedtime  4. Atrial fibrillation with RVR (HCC) -    rate controlled, continue metoprolol tartrate 25 mg twice a day  5. History of penile cancer -   S/P radical penectomy on 02/04/2015 -   on chronic indwelling Foley catheter, follows up with Dr. Alyson Ingles, urology  6.  Rhabdomyolysis -  S/P fall at home  -   Was given IV fluid -   CK trending down  7.  Slow transit constipation -  start Miralax 17 gm daily  8.  Shingles vaccine needed -Shingrix 0.5 mL IM x1  9.  Need for pneumonia vaccine -Prevnar 13 0.5 mL IM x1    Family/ staff Communication: Discussed plan of care with resident and charge  nurse.  Labs/tests ordered: CBC and BMP in 1 week  Goals of care:   Short-term care   Durenda Age, DNP, MSN, FNP-BC [facility] [phone] (Monday-Friday 8:00 a.m. - 5:00 p.m.) [phone] (after hours)

## 2021-07-17 ENCOUNTER — Non-Acute Institutional Stay (SKILLED_NURSING_FACILITY): Payer: Medicare Other | Admitting: Internal Medicine

## 2021-07-17 ENCOUNTER — Encounter: Payer: Self-pay | Admitting: Internal Medicine

## 2021-07-17 DIAGNOSIS — I4891 Unspecified atrial fibrillation: Secondary | ICD-10-CM | POA: Insufficient documentation

## 2021-07-17 DIAGNOSIS — N1831 Chronic kidney disease, stage 3a: Secondary | ICD-10-CM | POA: Diagnosis not present

## 2021-07-17 DIAGNOSIS — I4819 Other persistent atrial fibrillation: Secondary | ICD-10-CM | POA: Diagnosis not present

## 2021-07-17 DIAGNOSIS — N183 Chronic kidney disease, stage 3 unspecified: Secondary | ICD-10-CM

## 2021-07-17 DIAGNOSIS — R339 Retention of urine, unspecified: Secondary | ICD-10-CM | POA: Diagnosis not present

## 2021-07-17 DIAGNOSIS — D539 Nutritional anemia, unspecified: Secondary | ICD-10-CM

## 2021-07-17 DIAGNOSIS — L8990 Pressure ulcer of unspecified site, unspecified stage: Secondary | ICD-10-CM | POA: Insufficient documentation

## 2021-07-17 DIAGNOSIS — N390 Urinary tract infection, site not specified: Secondary | ICD-10-CM | POA: Diagnosis not present

## 2021-07-17 DIAGNOSIS — M6282 Rhabdomyolysis: Secondary | ICD-10-CM

## 2021-07-17 HISTORY — DX: Unspecified atrial fibrillation: I48.91

## 2021-07-17 HISTORY — DX: Chronic kidney disease, stage 3 unspecified: N18.30

## 2021-07-17 LAB — URINE CULTURE: Culture: >=100000 — AB

## 2021-07-17 NOTE — Progress Notes (Signed)
NURSING HOME LOCATION:  [facility] ROOM NUMBER:  [id number]  CODE STATUS:  Full Code  PCP:  Sharilyn Sites MD  This is a comprehensive admission note to this SNFperformed on this date less than 30 days from date of admission. Included are preadmission medical/surgical history; reconciled medication list; family history; social history and comprehensive review of systems.  Corrections and additions to the records were documented. Comprehensive physical exam was also performed. Additionally a clinical summary was entered for each active diagnosis pertinent to this admission in the Problem List to enhance continuity of care.  HPI: He was hospitalized 7/15 - 07/15/2021 with rhabdomyolysis & AKI in context of UTI. He had actually fallen on the floor 6 days prior to admission in the bathroom.  His Postman realized he was not picking up his mail and called the sheriff for a welfare check.  The patient was found down and brought to the ED.  He had sores on his body and generalized body aches with documented rhabdomyolysis.  CK was 897; troponin was stable in the range of 56-60.  White count was 13,900.  UA revealed moderate leukocytes and with many bacteria and budding yeast.  Empirically Rocephin was initiated. Culture and sensitivity revealed greater than 100,000 colonies of Enterococcus faecalis and 50,000 colonies of Stenotrophomonas maltophilia. AKI was present with creatinine of 1.39 and GFR 47; at discharge creatinine was 1.22 and GFR 55 indicating CKD stage IIIa.  There was some progression of anemia which was initially normocytic, normochromic.  At discharge hemoglobin/hematocrit values were 10/31.4 with an MCV of 103. A. fib with RVR was noted but rate control was achieved with metoprolol.  He was not felt to be a candidate for anticoagulation due to advanced age, comorbidities, and high risk of falling. He was to continue [facility] for 3 days post discharge to complete a 7-day  course for bacterial UTI.  He was also to continue Diflucan for 10 additional days to complete a 14-day course for Candida UTI. Urology follow-up for Foley catheter exchange by Dr. Alyson Ingles was to be scheduled for 7/20. PT/OT recommended placement at the SNF for rehab.  Past medical and surgical history:Includes history of CAD, dyslipidemia, history of penile cancer, obesity, and neurogenic bladder. Surgeries and procedures include back surgery,coronary angioplasty, CABG, and cholecystectomy.  Social history: Former smoker; nondrinker.  Family history: Is noncontributory due to his advanced age; but there is a strong family history of cancer.   Review of systems: He denies any cardiac or neurologic prodrome prior to the fall in the bathroom.  He states he just turned suddenly and fell to the floor.  He validated that he was basically on his back for 6 days without food intake.  He was consuming water from the toilet bowl. At this time his major concern is being constipated for 6 days despite Maalox x2. The appointment to have the cath changed yesterday apparently was not completed. PT/OT notes that he did did exhibit some wheezing with exertion today. He voices no active cardiopulmonary symptoms. He describes soreness in his hands. He denies any bleeding dyscrasias.  Also denies any paresthesias despite the macrocytosis.  Constitutional: No fever, significant weight change Eyes: No redness, discharge, pain, vision change ENT/mouth: No nasal congestion, purulent discharge, earache, change in hearing, sore throat  Cardiovascular: No chest pain, palpitations, paroxysmal nocturnal dyspnea, claudication  Respiratory: No cough, sputum production, hemoptysis, DOE, significant snoring, apnea  Gastrointestinal: No heartburn, dysphagia, abdominal pain, nausea /vomiting, rectal bleeding,  melena Genitourinary: No dysuria, hematuria, pyuria, incontinence, nocturia Musculoskeletal: No joint stiffness,  joint swelling, weakness, pain Dermatologic: No rash, pruritus, change in appearance of skin beyond posterior thoracic "sores" he can not visualize Neurologic: No dizziness, headache, syncope, seizures Psychiatric: No significant anxiety, depression, insomnia, anorexia Endocrine: No change in hair/skin/nails, excessive thirst, excessive hunger, excessive urination  Hematologic/lymphatic: No significant lymphadenopathy, abnormal bleeding  Physical exam:  Pertinent or positive findings: His responses are short & brusque answering questions.  Initially PT/OT was working with him as he was standing with 1 person assist supported by a rolling [place].  He move slowly and deliberately.  He has diffuse low-grade rales posteriorly.  Heart rhythm is irregular.  Abdomen is protuberant.  He has 1+ edema at the sock line.  There is extensive bruising over the forearms, greater on the right than the left.  He also has hyperpigmentation of the shins greater on the right than the left. Three large patches are present over posterior thorax.  General appearance: no acute distress, increased work of breathing is present.   Lymphatic: No lymphadenopathy about the head, neck, axilla. Eyes: No conjunctival inflammation or lid edema is present. There is no scleral icterus. Ears:  External ear exam shows no significant lesions or deformities.   Nose:  External nasal examination shows no deformity or inflammation. Nasal mucosa are pink and moist without lesions, exudates Oral exam: Lips and gums are healthy appearing.There is no oropharyngeal erythema or exudate. Neck:  No thyromegaly, masses, tenderness noted.    Heart:  No gallop, murmur, click, rub.  Lungs:  without wheezes, rhonchi, rubs. Abdomen: Bowel sounds are normal.  Abdomen is soft and nontender with no organomegaly, hernias, masses. GU: Deferred  Extremities:  No cyanosis, clubbing Neurologic exam:  Balance, Rhomberg, finger to nose testing could not be  completed due to clinical state Deep tendon reflexes are equal Skin: Warm & dry w/o tenting. No significant  rash.  See clinical summary under each active problem in the Problem List with associated updated therapeutic plan

## 2021-07-17 NOTE — Assessment & Plan Note (Signed)
Monitor CBC and verify B12 status.

## 2021-07-17 NOTE — Assessment & Plan Note (Signed)
I discussed the pathophysiology of rhabdomyolysis with him.  Because of his instability; I recommended a Medic Alert system as he lives alone.

## 2021-07-17 NOTE — Assessment & Plan Note (Signed)
SNF Wound Care Nurse to monitor resident. Refer to [facility] if progressive

## 2021-07-17 NOTE — Assessment & Plan Note (Addendum)
Clinically he is in A. fib today with an adequate rate control. Low grade rales & edema present. PT/OT reported wheezing w exertion. Trial of low dose Lasix. BNP & CXR if symptoms & signs progress. Note : IP CXR revealed NAD but evidence of remote chest trauma.

## 2021-07-17 NOTE — Assessment & Plan Note (Signed)
Med list reviewed; no nephrotoxic medications identified.

## 2021-07-17 NOTE — Patient Instructions (Signed)
See assessment and plan under each diagnosis in the problem list and acutely for this visit 

## 2021-07-18 NOTE — Assessment & Plan Note (Signed)
Missed appt 7/20 will be rescheduled

## 2021-07-18 NOTE — Assessment & Plan Note (Signed)
Omnicef completed as of 7/21

## 2021-07-19 ENCOUNTER — Inpatient Hospital Stay (HOSPITAL_COMMUNITY)
Admission: EM | Admit: 2021-07-19 | Discharge: 2021-07-29 | DRG: 378 | Disposition: A | Discharge status: Skilled Nursing Facility | Payer: Medicare Other | Source: Skilled Nursing Facility | Attending: Internal Medicine | Admitting: Internal Medicine

## 2021-07-19 ENCOUNTER — Inpatient Hospital Stay (HOSPITAL_COMMUNITY): Payer: Medicare Other

## 2021-07-19 DIAGNOSIS — R042 Hemoptysis: Secondary | ICD-10-CM | POA: Diagnosis not present

## 2021-07-19 DIAGNOSIS — R7401 Elevation of levels of liver transaminase levels: Secondary | ICD-10-CM | POA: Diagnosis present

## 2021-07-19 DIAGNOSIS — Z741 Need for assistance with personal care: Secondary | ICD-10-CM | POA: Diagnosis not present

## 2021-07-19 DIAGNOSIS — L89151 Pressure ulcer of sacral region, stage 1: Secondary | ICD-10-CM | POA: Diagnosis present

## 2021-07-19 DIAGNOSIS — D649 Anemia, unspecified: Secondary | ICD-10-CM | POA: Diagnosis not present

## 2021-07-19 DIAGNOSIS — K264 Chronic or unspecified duodenal ulcer with hemorrhage: Secondary | ICD-10-CM | POA: Diagnosis not present

## 2021-07-19 DIAGNOSIS — I4891 Unspecified atrial fibrillation: Secondary | ICD-10-CM | POA: Diagnosis not present

## 2021-07-19 DIAGNOSIS — Z79899 Other long term (current) drug therapy: Secondary | ICD-10-CM

## 2021-07-19 DIAGNOSIS — Z9181 History of falling: Secondary | ICD-10-CM | POA: Diagnosis not present

## 2021-07-19 DIAGNOSIS — K922 Gastrointestinal hemorrhage, unspecified: Secondary | ICD-10-CM | POA: Diagnosis present

## 2021-07-19 DIAGNOSIS — Z955 Presence of coronary angioplasty implant and graft: Secondary | ICD-10-CM

## 2021-07-19 DIAGNOSIS — N179 Acute kidney failure, unspecified: Secondary | ICD-10-CM | POA: Diagnosis present

## 2021-07-19 DIAGNOSIS — Z8549 Personal history of malignant neoplasm of other male genital organs: Secondary | ICD-10-CM

## 2021-07-19 DIAGNOSIS — I2510 Atherosclerotic heart disease of native coronary artery without angina pectoris: Secondary | ICD-10-CM | POA: Diagnosis present

## 2021-07-19 DIAGNOSIS — E875 Hyperkalemia: Secondary | ICD-10-CM | POA: Diagnosis present

## 2021-07-19 DIAGNOSIS — Z951 Presence of aortocoronary bypass graft: Secondary | ICD-10-CM | POA: Diagnosis not present

## 2021-07-19 DIAGNOSIS — D689 Coagulation defect, unspecified: Secondary | ICD-10-CM | POA: Diagnosis present

## 2021-07-19 DIAGNOSIS — R41841 Cognitive communication deficit: Secondary | ICD-10-CM | POA: Diagnosis not present

## 2021-07-19 DIAGNOSIS — I130 Hypertensive heart and chronic kidney disease with heart failure and stage 1 through stage 4 chronic kidney disease, or unspecified chronic kidney disease: Secondary | ICD-10-CM | POA: Diagnosis not present

## 2021-07-19 DIAGNOSIS — R278 Other lack of coordination: Secondary | ICD-10-CM | POA: Diagnosis not present

## 2021-07-19 DIAGNOSIS — R269 Unspecified abnormalities of gait and mobility: Secondary | ICD-10-CM | POA: Diagnosis present

## 2021-07-19 DIAGNOSIS — R000 Tachycardia, unspecified: Secondary | ICD-10-CM | POA: Diagnosis not present

## 2021-07-19 DIAGNOSIS — Z20822 Contact with and (suspected) exposure to covid-19: Secondary | ICD-10-CM | POA: Diagnosis present

## 2021-07-19 DIAGNOSIS — R58 Hemorrhage, not elsewhere classified: Secondary | ICD-10-CM | POA: Diagnosis not present

## 2021-07-19 DIAGNOSIS — D696 Thrombocytopenia, unspecified: Secondary | ICD-10-CM | POA: Diagnosis present

## 2021-07-19 DIAGNOSIS — I480 Paroxysmal atrial fibrillation: Secondary | ICD-10-CM | POA: Diagnosis present

## 2021-07-19 DIAGNOSIS — R1111 Vomiting without nausea: Secondary | ICD-10-CM | POA: Diagnosis not present

## 2021-07-19 DIAGNOSIS — L89121 Pressure ulcer of left upper back, stage 1: Secondary | ICD-10-CM | POA: Diagnosis present

## 2021-07-19 DIAGNOSIS — R6510 Systemic inflammatory response syndrome (SIRS) of non-infectious origin without acute organ dysfunction: Secondary | ICD-10-CM | POA: Diagnosis not present

## 2021-07-19 DIAGNOSIS — D62 Acute posthemorrhagic anemia: Secondary | ICD-10-CM

## 2021-07-19 DIAGNOSIS — L8990 Pressure ulcer of unspecified site, unspecified stage: Secondary | ICD-10-CM | POA: Diagnosis not present

## 2021-07-19 DIAGNOSIS — K920 Hematemesis: Secondary | ICD-10-CM | POA: Diagnosis not present

## 2021-07-19 DIAGNOSIS — D72829 Elevated white blood cell count, unspecified: Secondary | ICD-10-CM | POA: Diagnosis present

## 2021-07-19 DIAGNOSIS — E785 Hyperlipidemia, unspecified: Secondary | ICD-10-CM | POA: Diagnosis present

## 2021-07-19 DIAGNOSIS — I959 Hypotension, unspecified: Secondary | ICD-10-CM | POA: Diagnosis present

## 2021-07-19 DIAGNOSIS — N319 Neuromuscular dysfunction of bladder, unspecified: Secondary | ICD-10-CM | POA: Diagnosis present

## 2021-07-19 DIAGNOSIS — E669 Obesity, unspecified: Secondary | ICD-10-CM | POA: Diagnosis not present

## 2021-07-19 DIAGNOSIS — R0902 Hypoxemia: Secondary | ICD-10-CM | POA: Diagnosis not present

## 2021-07-19 DIAGNOSIS — Z7401 Bed confinement status: Secondary | ICD-10-CM | POA: Diagnosis not present

## 2021-07-19 DIAGNOSIS — Z6835 Body mass index (BMI) 35.0-35.9, adult: Secondary | ICD-10-CM

## 2021-07-19 DIAGNOSIS — N183 Chronic kidney disease, stage 3 unspecified: Secondary | ICD-10-CM | POA: Diagnosis not present

## 2021-07-19 DIAGNOSIS — N1831 Chronic kidney disease, stage 3a: Secondary | ICD-10-CM | POA: Diagnosis present

## 2021-07-19 DIAGNOSIS — K269 Duodenal ulcer, unspecified as acute or chronic, without hemorrhage or perforation: Secondary | ICD-10-CM | POA: Diagnosis not present

## 2021-07-19 DIAGNOSIS — Z7982 Long term (current) use of aspirin: Secondary | ICD-10-CM

## 2021-07-19 DIAGNOSIS — M6281 Muscle weakness (generalized): Secondary | ICD-10-CM | POA: Diagnosis not present

## 2021-07-19 DIAGNOSIS — K2950 Unspecified chronic gastritis without bleeding: Secondary | ICD-10-CM | POA: Diagnosis not present

## 2021-07-19 DIAGNOSIS — I5032 Chronic diastolic (congestive) heart failure: Secondary | ICD-10-CM | POA: Diagnosis present

## 2021-07-19 DIAGNOSIS — Z87891 Personal history of nicotine dependence: Secondary | ICD-10-CM | POA: Diagnosis not present

## 2021-07-19 DIAGNOSIS — R1311 Dysphagia, oral phase: Secondary | ICD-10-CM | POA: Diagnosis not present

## 2021-07-19 DIAGNOSIS — R2681 Unsteadiness on feet: Secondary | ICD-10-CM | POA: Diagnosis not present

## 2021-07-19 DIAGNOSIS — H353211 Exudative age-related macular degeneration, right eye, with active choroidal neovascularization: Secondary | ICD-10-CM | POA: Diagnosis not present

## 2021-07-19 DIAGNOSIS — Z7902 Long term (current) use of antithrombotics/antiplatelets: Secondary | ICD-10-CM | POA: Insufficient documentation

## 2021-07-19 DIAGNOSIS — M6282 Rhabdomyolysis: Secondary | ICD-10-CM | POA: Diagnosis not present

## 2021-07-19 DIAGNOSIS — K9289 Other specified diseases of the digestive system: Secondary | ICD-10-CM | POA: Diagnosis not present

## 2021-07-19 HISTORY — DX: Acute posthemorrhagic anemia: D62

## 2021-07-19 LAB — COMPREHENSIVE METABOLIC PANEL
ALT: 82 U/L — ABNORMAL HIGH (ref 0–44)
AST: 62 U/L — ABNORMAL HIGH (ref 15–41)
Albumin: 2.3 g/dL — ABNORMAL LOW (ref 3.5–5.0)
Alkaline Phosphatase: 43 U/L (ref 38–126)
Anion gap: 11 (ref 5–15)
BUN: 90 mg/dL — ABNORMAL HIGH (ref 8–23)
CO2: 19 mmol/L — ABNORMAL LOW (ref 22–32)
Calcium: 8.8 mg/dL — ABNORMAL LOW (ref 8.9–10.3)
Chloride: 108 mmol/L (ref 98–111)
Creatinine, Ser: 2.13 mg/dL — ABNORMAL HIGH (ref 0.61–1.24)
GFR, Estimated: 28 mL/min — ABNORMAL LOW (ref >60)
Glucose, Bld: 125 mg/dL — ABNORMAL HIGH (ref 70–99)
Potassium: 6.2 mmol/L — ABNORMAL HIGH (ref 3.5–5.1)
Sodium: 138 mmol/L (ref 135–145)
Total Bilirubin: 0.7 mg/dL (ref 0.3–1.2)
Total Protein: 5.1 g/dL — ABNORMAL LOW (ref 6.5–8.1)

## 2021-07-19 LAB — URINALYSIS, ROUTINE W REFLEX MICROSCOPIC
Bilirubin Urine: NEGATIVE
Glucose, UA: NEGATIVE mg/dL
Hgb urine dipstick: NEGATIVE
Ketones, ur: NEGATIVE mg/dL
Leukocytes,Ua: NEGATIVE
Nitrite: NEGATIVE
Protein, ur: NEGATIVE mg/dL
Specific Gravity, Urine: 1.014 (ref 1.005–1.030)
pH: 5 (ref 5.0–8.0)

## 2021-07-19 LAB — CBC WITH DIFFERENTIAL/PLATELET
Abs Immature Granulocytes: 0.1 10*3/uL — ABNORMAL HIGH (ref 0.00–0.07)
Basophils Absolute: 0.1 10*3/uL (ref 0.0–0.1)
Basophils Relative: 0 %
Eosinophils Absolute: 0 10*3/uL (ref 0.0–0.5)
Eosinophils Relative: 0 %
HCT: 25.4 % — ABNORMAL LOW (ref 39.0–52.0)
Hemoglobin: 7.8 g/dL — ABNORMAL LOW (ref 13.0–17.0)
Immature Granulocytes: 1 %
Lymphocytes Relative: 8 %
Lymphs Abs: 1.1 10*3/uL (ref 0.7–4.0)
MCH: 31.8 pg (ref 26.0–34.0)
MCHC: 30.7 g/dL (ref 30.0–36.0)
MCV: 103.7 fL — ABNORMAL HIGH (ref 80.0–100.0)
Monocytes Absolute: 0.9 10*3/uL (ref 0.1–1.0)
Monocytes Relative: 6 %
Neutro Abs: 12.5 10*3/uL — ABNORMAL HIGH (ref 1.7–7.7)
Neutrophils Relative %: 85 %
Platelets: 223 10*3/uL (ref 150–400)
RBC: 2.45 MIL/uL — ABNORMAL LOW (ref 4.22–5.81)
RDW: 16 % — ABNORMAL HIGH (ref 11.5–15.5)
WBC: 14.7 10*3/uL — ABNORMAL HIGH (ref 4.0–10.5)
nRBC: 0.2 % (ref 0.0–0.2)

## 2021-07-19 LAB — POC OCCULT BLOOD, ED: Fecal Occult Bld: POSITIVE — AB

## 2021-07-19 LAB — POTASSIUM: Potassium: 6 mmol/L — ABNORMAL HIGH (ref 3.5–5.1)

## 2021-07-19 LAB — BASIC METABOLIC PANEL
Anion gap: 11 (ref 5–15)
BUN: 90 mg/dL — ABNORMAL HIGH (ref 8–23)
CO2: 19 mmol/L — ABNORMAL LOW (ref 22–32)
Calcium: 8.5 mg/dL — ABNORMAL LOW (ref 8.9–10.3)
Chloride: 109 mmol/L (ref 98–111)
Creatinine, Ser: 1.97 mg/dL — ABNORMAL HIGH (ref 0.61–1.24)
GFR, Estimated: 31 mL/min — ABNORMAL LOW (ref >60)
Glucose, Bld: 112 mg/dL — ABNORMAL HIGH (ref 70–99)
Potassium: 6 mmol/L — ABNORMAL HIGH (ref 3.5–5.1)
Sodium: 139 mmol/L (ref 135–145)

## 2021-07-19 LAB — RESP PANEL BY RT-PCR (FLU A&B, COVID) ARPGX2
Influenza A by PCR: NEGATIVE
Influenza B by PCR: NEGATIVE
SARS Coronavirus 2 by RT PCR: NEGATIVE

## 2021-07-19 LAB — I-STAT CHEM 8, ED
BUN: 92 mg/dL — ABNORMAL HIGH (ref 8–23)
Calcium, Ion: 1.06 mmol/L — ABNORMAL LOW (ref 1.15–1.40)
Chloride: 108 mmol/L (ref 98–111)
Creatinine, Ser: 2.1 mg/dL — ABNORMAL HIGH (ref 0.61–1.24)
Glucose, Bld: 120 mg/dL — ABNORMAL HIGH (ref 70–99)
HCT: 24 % — ABNORMAL LOW (ref 39.0–52.0)
Hemoglobin: 8.2 g/dL — ABNORMAL LOW (ref 13.0–17.0)
Potassium: 6 mmol/L — ABNORMAL HIGH (ref 3.5–5.1)
Sodium: 136 mmol/L (ref 135–145)
TCO2: 21 mmol/L — ABNORMAL LOW (ref 22–32)

## 2021-07-19 LAB — MRSA NEXT GEN BY PCR, NASAL: MRSA by PCR Next Gen: NOT DETECTED

## 2021-07-19 LAB — PROTIME-INR
INR: 1.3 — ABNORMAL HIGH (ref 0.8–1.2)
Prothrombin Time: 16.6 seconds — ABNORMAL HIGH (ref 11.4–15.2)

## 2021-07-19 LAB — PREPARE RBC (CROSSMATCH)

## 2021-07-19 LAB — ABO/RH: ABO/RH(D): O NEG

## 2021-07-19 LAB — GLUCOSE, CAPILLARY
Glucose-Capillary: 113 mg/dL — ABNORMAL HIGH (ref 70–99)
Glucose-Capillary: 137 mg/dL — ABNORMAL HIGH (ref 70–99)
Glucose-Capillary: 97 mg/dL (ref 70–99)

## 2021-07-19 LAB — HEMOGLOBIN AND HEMATOCRIT, BLOOD
HCT: 24.7 % — ABNORMAL LOW (ref 39.0–52.0)
Hemoglobin: 8.1 g/dL — ABNORMAL LOW (ref 13.0–17.0)

## 2021-07-19 MED ORDER — CHLORHEXIDINE GLUCONATE CLOTH 2 % EX PADS: 6.0000 | MEDICATED_PAD | Freq: Every day | CUTANEOUS | Status: DC

## 2021-07-19 MED ORDER — ONDANSETRON HCL 4 MG/2ML IJ SOLN
4.0000 mg | Freq: Four times a day (QID) | INTRAMUSCULAR | Status: DC | PRN
  Administered 2021-07-19: 4 mg via INTRAVENOUS
  Filled 2021-07-19: qty 2

## 2021-07-19 MED ORDER — PANTOPRAZOLE 80MG IVPB - SIMPLE MED
80.0000 mg | Freq: Once | INTRAVENOUS | Status: AC
  Administered 2021-07-19: 80 mg via INTRAVENOUS
  Filled 2021-07-19: qty 80

## 2021-07-19 MED ORDER — AMIODARONE LOAD VIA INFUSION
150.0000 mg | Freq: Once | INTRAVENOUS | Status: AC
  Administered 2021-07-19: 150 mg via INTRAVENOUS
  Filled 2021-07-19: qty 83.34

## 2021-07-19 MED ORDER — LACTATED RINGERS IV BOLUS
1000.0000 mL | Freq: Once | INTRAVENOUS | Status: AC
  Administered 2021-07-19: 1000 mL via INTRAVENOUS

## 2021-07-19 MED ORDER — CHLORHEXIDINE GLUCONATE CLOTH 2 % EX PADS
6.0000 | MEDICATED_PAD | Freq: Every day | CUTANEOUS | Status: DC
  Administered 2021-07-20 – 2021-07-28 (×10): 6 via TOPICAL

## 2021-07-19 MED ORDER — PANTOPRAZOLE SODIUM 40 MG IV SOLR
40.0000 mg | Freq: Two times a day (BID) | INTRAVENOUS | Status: DC
  Administered 2021-07-22 – 2021-07-24 (×4): 40 mg via INTRAVENOUS
  Filled 2021-07-19 (×4): qty 40

## 2021-07-19 MED ORDER — ACETAMINOPHEN 650 MG RE SUPP: 650.0000 mg | Freq: Four times a day (QID) | RECTAL | Status: DC | PRN

## 2021-07-19 MED ORDER — AMIODARONE IV BOLUS ONLY 150 MG/100ML: 150.0000 mg | Freq: Once | INTRAVENOUS | Status: DC

## 2021-07-19 MED ORDER — PANTOPRAZOLE INFUSION (NEW) - SIMPLE MED
8.0000 mg/h | INTRAVENOUS | Status: AC
  Administered 2021-07-19 – 2021-07-22 (×7): 8 mg/h via INTRAVENOUS
  Filled 2021-07-19 (×7): qty 80

## 2021-07-19 MED ORDER — SODIUM CHLORIDE 0.9% FLUSH
3.0000 mL | Freq: Two times a day (BID) | INTRAVENOUS | Status: DC
  Administered 2021-07-19 – 2021-07-28 (×16): 3 mL via INTRAVENOUS

## 2021-07-19 MED ORDER — DEXTROSE 50 % IV SOLN
1.0000 | Freq: Once | INTRAVENOUS | Status: AC
  Administered 2021-07-19: 50 mL via INTRAVENOUS
  Filled 2021-07-19: qty 50

## 2021-07-19 MED ORDER — ALBUTEROL SULFATE (2.5 MG/3ML) 0.083% IN NEBU: 2.5000 mg | INHALATION_SOLUTION | Freq: Four times a day (QID) | RESPIRATORY_TRACT | Status: DC | PRN

## 2021-07-19 MED ORDER — SODIUM ZIRCONIUM CYCLOSILICATE 10 G PO PACK
10.0000 g | PACK | Freq: Three times a day (TID) | ORAL | Status: AC
  Filled 2021-07-19 (×2): qty 1

## 2021-07-19 MED ORDER — SODIUM BICARBONATE 8.4 % IV SOLN
50.0000 meq | Freq: Once | INTRAVENOUS | Status: AC
  Administered 2021-07-19: 50 meq via INTRAVENOUS
  Filled 2021-07-19: qty 50

## 2021-07-19 MED ORDER — SODIUM CHLORIDE 0.9 % IV SOLN
10.0000 mL/h | Freq: Once | INTRAVENOUS | Status: DC
Start: 2021-07-19 — End: 2021-07-29

## 2021-07-19 MED ORDER — CALCIUM GLUCONATE-NACL 1-0.675 GM/50ML-% IV SOLN
1.0000 g | Freq: Once | INTRAVENOUS | Status: AC
  Administered 2021-07-19: 1000 mg via INTRAVENOUS
  Filled 2021-07-19: qty 50

## 2021-07-19 MED ORDER — SODIUM CHLORIDE 0.9% IV SOLUTION: Freq: Once | INTRAVENOUS | Status: DC

## 2021-07-19 MED ORDER — INSULIN ASPART 100 UNIT/ML IV SOLN
5.0000 [IU] | Freq: Once | INTRAVENOUS | Status: AC
  Administered 2021-07-19: 5 [IU] via INTRAVENOUS

## 2021-07-19 MED ORDER — ACETAMINOPHEN 325 MG PO TABS
650.0000 mg | ORAL_TABLET | Freq: Four times a day (QID) | ORAL | Status: DC | PRN
  Administered 2021-07-22 – 2021-07-23 (×2): 650 mg via ORAL
  Filled 2021-07-19 (×3): qty 2

## 2021-07-19 MED ORDER — AMIODARONE HCL IN DEXTROSE 360-4.14 MG/200ML-% IV SOLN
60.0000 mg/h | INTRAVENOUS | Status: AC
  Administered 2021-07-19: 60 mg/h via INTRAVENOUS
  Filled 2021-07-19: qty 200

## 2021-07-19 MED ORDER — AMIODARONE LOAD VIA INFUSION: 150.0000 mg | Freq: Once | INTRAVENOUS | Status: DC

## 2021-07-19 MED ORDER — ONDANSETRON HCL 4 MG PO TABS: 4.0000 mg | ORAL_TABLET | Freq: Four times a day (QID) | ORAL | Status: DC | PRN

## 2021-07-19 MED ORDER — AMIODARONE HCL IN DEXTROSE 360-4.14 MG/200ML-% IV SOLN: 30.0000 mg/h | INTRAVENOUS | Status: DC

## 2021-07-19 MED ORDER — FUROSEMIDE 10 MG/ML IJ SOLN
20.0000 mg | Freq: Once | INTRAMUSCULAR | Status: DC
  Filled 2021-07-19: qty 2

## 2021-07-19 NOTE — ED Notes (Signed)
Pt c/o tightness at IV site running protonix; protonix drip stopped; iv pulls back blood, flushes easily, but tightness and coolness noted around site; IV removed; Dr. Tamala Julian aware

## 2021-07-19 NOTE — ED Notes (Signed)
Pharmacy (spoke with Jenny Reichmann) notified of possible infiltration of Protonix; directed to apply cool compress and monitor; cool compress applied; will continue to monitor

## 2021-07-19 NOTE — Consult Note (Signed)
Referring Provider: Dr. Fuller Plan  Primary Care Physician:  Sharilyn Sites, MD Primary Gastroenterologist: Althia Forts  Reason for Consultation: Hematemesis, melena  HPI: Caleb [name] is a 87 y.o. male with a past medical history of obesity, coronary artery disease s/p DES 2006 on ASA and Plavix,  hyperlipidemia, penile cancer with neurogenic bladder requiring self-catheterization. Past appendectomy, back surgery and cholecystectomy.  He was admitted to the hospital 07/11/2021 after falling at home and was found to have rhabdomyolysis and a UTI.  During this admission, he developed atrial fibrillation with RVR.  An echo showed LVEF 65 to 70%.  He is not considered a candidate for anticoagulation secondary to his advanced age and at risk for falls.  He was discharged home to the [facility] 07/15/2021 on Omnicef for 3 days to complete his regimen for bacterial UTI and Diflucan x14 days for Candida UTI .  He developed hematemesis with melena at the SNF this morning and he was transported to [facility] ED this morning for further evaluation.  In the ED, he was tachycardic and mildly hypotensive.  He received 2 L normal saline IV.  K+ 6.2.  BUN 90  (BUN 32 on 7/19).  Creatinine 2.13 (Cr 1.22 on 7/19). Hemoglobin level 7.8 ( Hg 10.0 on 07/14/2021). Hematocrit 25.4.  WBC 14.7.  AST 62.  ALT 82.  Total bili 0.7.  Alk phos 43. Two units of PRBCs were ordered, the first unit is transfusing at this time.  He received Pantoprazole 23m IV followed by 852mhr infusion.  He denies having any nausea or vomiting.  No heartburn or dysphagia.  No chest pain or shortness of breath.  He was awakened by the nursing staff at the SNF early this morning and he felt nauseous then vomited a large amount of black appearing emesis.  He denies passing any obvious bloody bowel movements this morning.  Unaware of passing any bloody bowel movements or black stools in the past.  No prior history of a GI bleed.  History of heart  disease with past DES for which he takes Plavix 75 mg daily and aspirin 81 mg daily.  His last dose of aspirin and Plavix were taken at 9 AM on 06/2021/2022 as confirmed by the SNF medical staff.  Currently, he is in afib with RVR, heart rate 130s to 140s, received Amiodarone IV.  Blood pressure is stable at this time.  He just passed a moderate mount of melenic stool in his diaper.  The first unit of PRBCs is transfusing at this time.  He complains of left arm tightness at the IV insertion site.  His RN was called to the bed to assess the IV patency and to place a new IV if needed.  Dr. RuDarci Currents at the bedside at this time as well.  I spoke to the patient's son DaLinna Hoffho is also his POA provided an update regarding his father's current status in the ED.   Echo 07/12/2021: 1. Left ventricular ejection fraction, by estimation, is 65 to 70%. The left ventricle has hyperdynamic function. The left ventricle has no regional wall motion abnormalities. There is mild concentric left ventricular hypertrophy. Left ventricular diastolic function could not be evaluated. 2. Right ventricular systolic function is normal. The right ventricular size is normal. There is normal pulmonary artery systolic pressure. 3. Left atrial size was moderately dilated. 4. Right atrial size was mildly dilated. 5. The mitral valve is normal in structure. No evidence of mitral valve regurgitation.  6. The aortic valve is tricuspid. There is moderate calcification of the aortic valve. There is moderate thickening of the aortic valve. Aortic valve regurgitation is not visualized. Mild to moderate aortic valve stenosis. 7. Aortic dilatation noted. There is mild dilatation of the aortic root, measuring 40 mm. There is mild dilatation of the ascending aorta, measuring 39 mm. 8. The inferior vena cava is normal in size with greater than 50% respiratory variability, suggesting right atrial pressure of 3 mmHg  Past Medical History:   Diagnosis Date   Coronary artery disease    Hyperlipidemia    Obesity    Penile cancer (HCC)    Swelling of extremity    Left Leg    Past Surgical History:  Procedure Laterality Date   APPENDECTOMY     BACK SURGERY     CHOLECYSTECTOMY     CORONARY ANGIOPLASTY  11/2005   RCA PCI AND STENTING WITH A CYPHER DES   CORONARY ARTERY BYPASS GRAFT     HIATAL HERNIA REPAIR     lymph node removal     NM MYOCAR MULTIPLE W/SPECT  11/26/2009   EF 62%. NORMAL MYOCARDIAL PERFUSION STUDY.   TRANSTHORACIC ECHOCARDIOGRAM  12/22/2005   MILD TO MOD AORTIC SCLEROSIS W/O STENOSIS. MILD TO MODERATE MITRAL CALCIFICATION. LA- MILDLY DILATED.    Prior to Admission medications   Medication Sig Start Date End Date Taking? Authorizing Provider  acetaminophen (TYLENOL) 325 MG tablet Take 1-2 tablets (325-650 mg total) by mouth every 6 (six) hours as needed for fever, headache, mild pain or moderate pain. 01/14/16  Yes Nat Christen, PA-C  Ascorbic Acid (VITAMIN C) 1000 MG tablet Take 1,000 mg by mouth daily.    Yes [provider]  aspirin EC 81 MG tablet Take 81 mg by mouth daily.   Yes [provider]  Calcium Carbonate-Vitamin D (CALCIUM-D PO) Take 1 tablet by mouth daily.   Yes [provider]  clopidogrel (PLAVIX) 75 MG tablet TAKE 1 TABLET EVERY DAY Patient taking differently: Take 75 mg by mouth daily. 03/12/21  Yes Lorretta Harp, MD  fluconazole (DIFLUCAN) 100 MG tablet Take 1 tablet (100 mg total) by mouth daily for 10 days. 07/16/21 07/26/21 Yes Shah, Pratik D, DO  furosemide (LASIX) 20 MG tablet Take 20 mg by mouth daily. Take for 5 days.   Yes [provider]  lactulose (CHRONULAC) 10 GM/15ML solution Take 10 g by mouth 2 (two) times daily as needed for mild constipation.   Yes [provider]  metoprolol tartrate (LOPRESSOR) 25 MG tablet TAKE 1 TABLET TWICE DAILY (NEED MD APPOINTMENT) Patient taking differently: Take 25 mg by mouth 2 (two) times  daily. 03/06/21  Yes Lorretta Harp, MD  Multiple Vitamin (MULTIVITAMIN WITH MINERALS) TABS tablet Take 1 tablet by mouth daily. Centrum Silver   Yes [provider]  Multiple Vitamins-Minerals (PRESERVISION AREDS 2) CAPS Take by mouth.   Yes [provider]  polyethylene glycol powder (GLYCOLAX/MIRALAX) powder Take 17 g by mouth daily as needed for mild constipation or moderate constipation.   Yes [provider]  simvastatin (ZOCOR) 20 MG tablet TAKE 1 TABLET AT BEDTIME Patient taking differently: Take 20 mg by mouth at bedtime. 09/12/20  Yes Lorretta Harp, MD    Current Facility-Administered Medications  Medication Dose Route Frequency Provider Last Rate Last Admin   0.9 %  sodium chloride infusion  10 mL/hr Intravenous Once Norval Morton, MD       acetaminophen (  TYLENOL) tablet 650 mg  650 mg Oral Q6H PRN Norval Morton, MD       Or   acetaminophen (TYLENOL) suppository 650 mg  650 mg Rectal Q6H PRN Fuller Plan A, MD       albuterol (PROVENTIL) (2.5 MG/3ML) 0.083% nebulizer solution 2.5 mg  2.5 mg Nebulization Q6H PRN Tamala Julian, Rondell A, MD       ondansetron (ZOFRAN) tablet 4 mg  4 mg Oral Q6H PRN Fuller Plan A, MD       Or   ondansetron (ZOFRAN) injection 4 mg  4 mg Intravenous Q6H PRN Norval Morton, MD       [START ON 07/22/2021] pantoprazole (PROTONIX) injection 40 mg  40 mg Intravenous Q12H Smith, Rondell A, MD       pantoprozole (PROTONIX) 80 mg /NS 100 mL infusion  8 mg/hr Intravenous Continuous Fuller Plan A, MD 10 mL/hr at 07/19/21 0850 8 mg/hr at 07/19/21 0850   sodium chloride flush (NS) 0.9 % injection 3 mL  3 mL Intravenous Q12H Norval Morton, MD       Current Outpatient Medications  Medication Sig Dispense Refill   acetaminophen (TYLENOL) 325 MG tablet Take 1-2 tablets (325-650 mg total) by mouth every 6 (six) hours as needed for fever, headache, mild pain or moderate pain.     Ascorbic Acid (VITAMIN C) 1000 MG tablet Take  1,000 mg by mouth daily.      aspirin EC 81 MG tablet Take 81 mg by mouth daily.     Calcium Carbonate-Vitamin D (CALCIUM-D PO) Take 1 tablet by mouth daily.     clopidogrel (PLAVIX) 75 MG tablet TAKE 1 TABLET EVERY DAY (Patient taking differently: Take 75 mg by mouth daily.) 90 tablet 0   fluconazole (DIFLUCAN) 100 MG tablet Take 1 tablet (100 mg total) by mouth daily for 10 days. 10 tablet 0   furosemide (LASIX) 20 MG tablet Take 20 mg by mouth daily. Take for 5 days.     lactulose (CHRONULAC) 10 GM/15ML solution Take 10 g by mouth 2 (two) times daily as needed for mild constipation.     metoprolol tartrate (LOPRESSOR) 25 MG tablet TAKE 1 TABLET TWICE DAILY (NEED MD APPOINTMENT) (Patient taking differently: Take 25 mg by mouth 2 (two) times daily.) 60 tablet 0   Multiple Vitamin (MULTIVITAMIN WITH MINERALS) TABS tablet Take 1 tablet by mouth daily. Centrum Silver     Multiple Vitamins-Minerals (PRESERVISION AREDS 2) CAPS Take by mouth.     polyethylene glycol powder (GLYCOLAX/MIRALAX) powder Take 17 g by mouth daily as needed for mild constipation or moderate constipation.     simvastatin (ZOCOR) 20 MG tablet TAKE 1 TABLET AT BEDTIME (Patient taking differently: Take 20 mg by mouth at bedtime.) 60 tablet 1    Allergies as of 07/19/2021   (No Known Allergies)    Family History  Problem Relation Age of Onset   Cancer Father    Cancer Sister    Cancer Brother    Cancer Sister    Cancer Other     Social History   Socioeconomic History   Marital status: Divorced    Spouse name: Not on file   Number of children: Not on file   Years of education: Not on file   Highest education level: Not on file  Occupational History   Occupation: retired    Fish farm manager: RETIRED  Tobacco Use   Smoking status: Former    Types: Cigarettes    Quit  date: 07/14/1983    Years since quitting: 38.0   Smokeless tobacco: Never  Vaping Use   Vaping Use: Never used  Substance and Sexual Activity   Alcohol  use: No   Drug use: No   Sexual activity: Not on file  Other Topics Concern   Not on file  Social History Narrative   Not on file   Social Determinants of Health   Financial Resource Strain: Not on file  Food Insecurity: Not on file  Transportation Needs: Not on file  Physical Activity: Not on file  Stress: Not on file  Social Connections: Not on file  Intimate Partner Violence: Not on file    Review of Systems: HPI, other systems reviewed and are negative Physical Exam: Vital signs in last 24 hours: Temp:  [97.7 F (36.5 C)-98.1 F (36.7 C)] 98.1 F (36.7 C) (07/23 0911) Pulse Rate:  [126-145] 145 (07/23 0911) Resp:  [17-24] 22 (07/23 0911) BP: (78-100)/(55-78) 83/68 (07/23 0911) SpO2:  [95 %-99 %] 99 % (07/23 0845)   General: Alert ill pale appearing 87 year old male. Head:  Normocephalic and atraumatic. Eyes:  No scleral icterus. Conjunctiva pink. Ears:  Normal auditory acuity. Nose:  No deformity, discharge or lesions. Mouth:  Dentition intact. No ulcers or lesions.  Neck:  Supple. No lymphadenopathy or thyromegaly.  Lungs: Breath sounds clear throughout. Heart: Regular rhythm, no murmur. Abdomen: Soft, nondistended.  Positive bowel sounds all 4 quadrants.  Large midline upper abdominal scar intact. GU: Indwelling Foley catheter intact Rectal: Diaper filled with a moderate amount of melenic stool. Musculoskeletal:  Symmetrical without gross deformities.  Pulses:  Normal pulses noted. Extremities: Right lower extremity with 2+ edema, left lower extremity 1+ edema, bilateral brawny stasis discoloration. Neurologic:  Alert and  oriented x 4. No focal deficits.  Skin:  Intact without significant lesions or rashes. Psych:  Alert and cooperative. Normal mood and affect.  Intake/Output from previous day: No intake/output data recorded. Intake/Output this shift: Total I/O In: 1531.7 [IV Piggyback:1531.7] Out: -   Lab Results: Recent Labs    07/19/21 0603  07/19/21 0643  WBC 14.7*  --   HGB 7.8* 8.2*  HCT 25.4* 24.0*  PLT 223  --    BMET Recent Labs    07/19/21 0603 07/19/21 0643  NA 138 136  K 6.2* 6.0*  CL 108 108  CO2 19*  --   GLUCOSE 125* 120*  BUN 90* 92*  CREATININE 2.13* 2.10*  CALCIUM 8.8*  --    LFT Recent Labs    07/19/21 0603  PROT 5.1*  ALBUMIN 2.3*  AST 62*  ALT 82*  ALKPHOS 43  BILITOT 0.7   PT/INR Recent Labs    07/19/21 0652  LABPROT 16.6*  INR 1.3*   Hepatitis Panel No results for input(s): HEPBSAG, HCVAB, HEPAIGM, HEPBIGM in the last 72 hours.    Studies/Results: DG CHEST PORT 1 VIEW  Result Date: 07/19/2021 CLINICAL DATA:  Hemoptysis yesterday EXAM: PORTABLE CHEST 1 VIEW COMPARISON:  07/11/2021 FINDINGS: Normal heart size and mediastinal contours. Low volume chest. No acute infiltrate or edema. No effusion or pneumothorax. No acute osseous findings. Artifact from EKG leads. IMPRESSION: Stable exam.  No acute finding. Electronically Signed   By: Monte Fantasia M.D.   On: 07/19/2021 09:03    IMPRESSION/PLAN:  87 year old male with GI bleed/hematemesis/melena with anemia.  Hemoglobin 7.8 with a markedly elevated BUN 90.  Two units of PRBCs ordered, the first unit is transfusing at this time.  He  received Pantoprazole 80 mg IV bolus then 8 mg/hour infusion.  He passed a moderate amount of melenic stool x1 in the ED.  No further hematemesis since arriving to the ED.  BP stable with atrial fibrillation with RVR, heart rate 110's to 140s. -NPO -Ondansetron 4 mg IV every 6 hours as needed -Check H&H after second blood transfusion completed -Transfuse for hemoglobin less than 8 -Continue PPI infusion -EGD benefits and risks discussed including risk with sedation, risk of bleeding, perforation and infection.  Timing of EGD to be verified by Dr. Havery Moros.  He last took Plavix on 7/22 at 9 AM.  I spoke with his son Linna Hoff who is POA and reviewed the EGD procedure, risks and benefits and obtained his  consent to proceed with an EGD when appropriate.  The patient also agrees to proceed with an EGD. -He will require admission to the ICU  Atrial Fibrillation with RVR, received Amiodarone IV  Recently admitted to the hospital with a UTI and rhabdomyolysis.  LFTs mildly elevated.  History of CAD past DES on ASA and Plavix.  Last dose of ASA and Plavix was 9 AM on 07/18/2021     Noralyn Pick  07/19/2021, 9:19 AM

## 2021-07-19 NOTE — Progress Notes (Signed)
Spoke with Ovid Curd, RN for pt.. Discussed the potential need for a midline or PIV. Currently the pt has adequate access. RN stated he would add a second consult if the need arose.

## 2021-07-19 NOTE — ED Notes (Signed)
Iv team at bedside  

## 2021-07-19 NOTE — Progress Notes (Signed)
GI UPDATE:  I've seen the patient a few times this afternoon. Repeat Hgb just now after 2 units RBC transfusion shows Hgb of 8.1, from 7.8, so he is clearly having active bleeding. However nursing states stool output seems to be slowing. He remains in AF with RVR, rates 120s, BP stable in low 100s, and K is 6.0 and being treated for hyperkalemia per CCM. Discussed active issues with the patient and his son Rush Landmark and grandson Linton Rump, both earlier today who have since left. He warrants endoscopy question is timing. I offered him an endoscopy now to further evaluate this while he has been relatively stable, but this would entail intubation to protect his airway, and anesthesia, understands hemodynamic risks with this. He does not want to be intubated right now or anesthesia and wants to hold off on endoscopy tonight unless he worsens acutely. I explained to him that he could worsen if we don't do this now and that his blood loss is likely driving his tachycardia, but he states he "is not up for it" right now and declines. I called his son Eber and reviewed our discussion, he understands the situation and respects his fathers wishes to hold off on endoscopy right now.   Will continue IV PPI, management of hyperkalemia per CCM and would give another unit RBC transfusion now. I am available to do endoscopy this evening if needed pending his course if acute worsening. Otherwise hopefully with conservative measures he stabilizes a bit more and perhaps do his procedure in the AM. Consider platelet transfusion with worsening bleeding, hoping to avoid that if possible. He otherwise is mentating well and his pressures have been fairly stable when I have seen him. Please call me with changes in his status, we will plan on repeating a Hgb in a few hours.  Jolly Mango, MD [facility]

## 2021-07-19 NOTE — Consult Note (Signed)
NAME:  Caleb [name], MRN:  [id number], DOB:  07-27-1925, LOS: 0 ADMISSION DATE:  07/19/2021, CONSULTATION DATE:  07/19/21 REFERRING MD:  GI, CHIEF COMPLAINT:  blood in mouth   History of Present Illness:  87 year old man on Plavix for history of CAD admitted after seeing blood in mouth, melenic stools present.  Patient resides in a nursing facility.  Admitted to the ICU at the request of GI in case of decompensation and emergent endoscopy.  Patient is difficult to obtain history from. Not forthcoming with answers.  Lives in nursing facility.  Complained of nausea and vomiting.  Noticed some bloody emesis or secretions in the mouth.  Maybe some hemoptysis not clear.  Continued nausea and vomiting.  Dark stools present.  EMS was called.  Heart rates tach to the 130s.  Atrial fibrillation with RVR.  Upon arrival to the ED, tachycardia, soft blood pressures.  Was received 2 L of crystalloid.  In addition 1 unit PRBC administered.  Second unit ordered.  GI consulted.  Recommend ICU admission.  Notably, hemoglobin down a couple points from recent discharge.  Continues to have melenic stools in the ED. Pertinent  Medical History  Afib, CAD PCI RCA 2006, [place] [facility] Events: Including procedures, antibiotic start and stop dates in addition to other pertinent events   7/23 admitted with GI bleed  Interim History / Subjective:  N/a  Objective   Blood pressure 110/75, pulse (!) 125, temperature 98.6 F (37 C), temperature source Oral, resp. rate 20, SpO2 99 %.        Intake/Output Summary (Last 24 hours) at 07/19/2021 1111 Last data filed at 07/19/2021 0941 Gross per 24 hour  Intake 2531.73 ml  Output --  Net 2531.73 ml   There were no vitals filed for this visit.  Examination: General: Elderly, gruff Eyes EOMI, no icterus Neck: Supple, no JVP Cardiovascular: Irregularly irregular, no murmur Pulmonary: No work of breathing, on room air Skin: Easy bruising, no lesions Neuro:  No weakness, sensation appears intact Psych: Standoffish, not forthcoming with answers, flat affect   Resolved Hospital Problem list     Assessment & Plan:  GI bleed: Favor upper bleed given reports of spitting up blood, "" hemoptysis".  Melena on exam. --Pantoprazole infusion --Status post 1 unit PRBC transfusion, second unit ordered to be administered --Maintain 2 large-bore Ivs -- GI consult, admit to ICU for close monitoring --N.p.o. in case of procedure  Atrial fibrillation: Briefly in RVR.  Amiodarone infusion restarted.  Heart rates 90s to 100s at time of evaluation. --Continue amnio gtt. for now  CAD: Status post PCI to RCA in 2006. --Hold Plavix in setting of GI bleed, unclear why patient is on Plavix this long-term, consider low-dose aspirin in the future given history of PCI and CAD for secondary prevention --hold lasix, metoprolol  Best Practice (right click and "Reselect all SmartList Selections" daily)   Diet/type: NPO w/ oral meds DVT prophylaxis: SCD GI prophylaxis: PPI Lines: N/A Foley:  N/A Code Status:  full code Last date of multidisciplinary goals of care discussion [n/a]  Labs   CBC: Recent Labs  Lab 07/13/21 0225 07/14/21 0523 07/19/21 0603 07/19/21 0643  WBC 8.0 6.7 14.7*  --   NEUTROABS  --   --  12.5*  --   HGB 9.3* 10.0* 7.8* 8.2*  HCT 29.4* 31.4* 25.4* 24.0*  MCV 100.3* 103.0* 103.7*  --   PLT 185 209 223  --     Basic Metabolic Panel:  Recent Labs  Lab 07/13/21 0225 07/14/21 0523 07/15/21 0441 07/19/21 0603 07/19/21 0643  NA 136 136 135 138 136  K 4.1 4.5 4.5 6.2* 6.0*  CL 110 108 108 108 108  CO2 21* 23 22 19*  --   GLUCOSE 105* 99 100* 125* 120*  BUN 47* 35* 32* 90* 92*  CREATININE 1.26* 1.23 1.22 2.13* 2.10*  CALCIUM 7.8* 8.2* 8.0* 8.8*  --   MG 2.1 2.1 1.9  --   --    GFR: Estimated Creatinine Clearance: 26.3 mL/min (A) (by C-G formula based on SCr of 2.1 mg/dL (H)). Recent Labs  Lab 07/13/21 0225 07/14/21 0523  07/19/21 0603  WBC 8.0 6.7 14.7*    Liver Function Tests: Recent Labs  Lab 07/13/21 0225 07/14/21 0523 07/15/21 0441 07/19/21 0603  AST 49* 46* 45* 62*  ALT 52* 54* 55* 82*  ALKPHOS 40 42 38 43  BILITOT 0.5 0.5 0.5 0.7  PROT 4.9* 5.2* 4.9* 5.1*  ALBUMIN 2.4* 2.4* 2.3* 2.3*   No results for input(s): LIPASE, AMYLASE in the last 168 hours. No results for input(s): AMMONIA in the last 168 hours.  ABG    Component Value Date/Time   TCO2 21 (L) 07/19/2021 0643     Coagulation Profile: Recent Labs  Lab 07/19/21 0652  INR 1.3*    Cardiac Enzymes: Recent Labs  Lab 07/13/21 0225 07/14/21 0523 07/15/21 0441  CKTOTAL 393 150 91    HbA1C: No results found for: HGBA1C  CBG: No results for input(s): GLUCAP in the last 168 hours.  Review of Systems:   No chest pain. Denies SOB. Comprehensive ROS otherwise negative.   Past Medical History:  He,  has a past medical history of Coronary artery disease, Hyperlipidemia, Obesity, Penile cancer ([place]), and Swelling of extremity.   Surgical History:   Past Surgical History:  Procedure Laterality Date   APPENDECTOMY     BACK SURGERY     CHOLECYSTECTOMY     CORONARY ANGIOPLASTY  11/2005   RCA PCI AND STENTING WITH A CYPHER DES   CORONARY ARTERY BYPASS GRAFT     HIATAL HERNIA REPAIR     lymph node removal     NM MYOCAR MULTIPLE W/SPECT  11/26/2009   EF 62%. NORMAL MYOCARDIAL PERFUSION STUDY.   TRANSTHORACIC ECHOCARDIOGRAM  12/22/2005   MILD TO MOD AORTIC SCLEROSIS W/O STENOSIS. MILD TO MODERATE MITRAL CALCIFICATION. LA- MILDLY DILATED.     Social History:   reports that he quit smoking about 38 years ago. His smoking use included cigarettes. He has never used smokeless tobacco. He reports that he does not drink alcohol and does not use drugs.   Family History:  His family history includes Cancer in his brother, father, sister, sister, and another family member.   Allergies No Known Allergies   Home Medications   Prior to Admission medications   Medication Sig Start Date End Date Taking? Authorizing Provider  acetaminophen (TYLENOL) 325 MG tablet Take 1-2 tablets (325-650 mg total) by mouth every 6 (six) hours as needed for fever, headache, mild pain or moderate pain. 01/14/16  Yes Nat Christen, PA-C  Ascorbic Acid (VITAMIN C) 1000 MG tablet Take 1,000 mg by mouth daily.    Yes [provider]  aspirin EC 81 MG tablet Take 81 mg by mouth daily.   Yes [provider]  Calcium Carbonate-Vitamin D (CALCIUM-D PO) Take 1 tablet by mouth daily.   Yes [provider]  clopidogrel (PLAVIX) 75 MG  tablet TAKE 1 TABLET EVERY DAY Patient taking differently: Take 75 mg by mouth daily. 03/12/21  Yes Lorretta Harp, MD  fluconazole (DIFLUCAN) 100 MG tablet Take 1 tablet (100 mg total) by mouth daily for 10 days. 07/16/21 07/26/21 Yes Shah, Pratik D, DO  furosemide (LASIX) 20 MG tablet Take 20 mg by mouth daily. Take for 5 days.   Yes [provider]  lactulose (CHRONULAC) 10 GM/15ML solution Take 10 g by mouth 2 (two) times daily as needed for mild constipation.   Yes [provider]  metoprolol tartrate (LOPRESSOR) 25 MG tablet TAKE 1 TABLET TWICE DAILY (NEED MD APPOINTMENT) Patient taking differently: Take 25 mg by mouth 2 (two) times daily. 03/06/21  Yes Lorretta Harp, MD  Multiple Vitamin (MULTIVITAMIN WITH MINERALS) TABS tablet Take 1 tablet by mouth daily. Centrum Silver   Yes [provider]  Multiple Vitamins-Minerals (PRESERVISION AREDS 2) CAPS Take by mouth.   Yes [provider]  polyethylene glycol powder (GLYCOLAX/MIRALAX) powder Take 17 g by mouth daily as needed for mild constipation or moderate constipation.   Yes [provider]  simvastatin (ZOCOR) 20 MG tablet TAKE 1 TABLET AT BEDTIME Patient taking differently: Take 20 mg by mouth at bedtime. 09/12/20  Yes Lorretta Harp, MD     Critical care time:     CRITICAL  CARE Performed by: Lanier Clam   Total critical care time: 40 minutes  Critical care time was exclusive of separately billable procedures and treating other patients.  Critical care was necessary to treat or prevent imminent or life-threatening deterioration.  Critical care was time spent personally by me on the following activities: development of treatment plan with patient and/or surrogate as well as nursing, discussions with consultants, evaluation of patient's response to treatment, examination of patient, obtaining history from patient or surrogate, ordering and performing treatments and interventions, ordering and review of laboratory studies, ordering and review of radiographic studies, pulse oximetry and re-evaluation of patient's condition.

## 2021-07-19 NOTE — H&P (Signed)
History and Physical    Caleb [name] [id number] DOB: 15-May-1925 DOA: 07/19/2021  Referring MD/NP/PA: Charlesetta Shanks, MD PCP: Sharilyn Sites, MD  Patient coming from: [facility] skilled nursing facility via EMS  Chief Complaint: Vomiting blood  I have personally briefly reviewed patient's old medical records in [place]   HPI: Caleb [name] is a 87 y.o. male with medical history significant of coronary artery disease, hyperlipidemia, obesity, penile cancer with neurogenic bladder presents with complaints of vomiting blood.  History is obtained from the patient who admits that he may be a little confused on his dates.  He reports that he did not feel well yesterday.  Reported having nausea this morning for which she told several staff before having several episodes of dark bloody emesis.  Associated symptoms include sore throat, lower abdominal pain, and lower extremity swelling. Denies having any chest pain, shortness of breath, or dark stools to his knowledge.  He thinks that he last took aspirin and Plavix yesterday.  Patient had just recently been hospitalized from 7/15-7/19, after sustaining a fall at home and had been on the floor for 6 days prior admission with rhabdomyolysis and UTI. Empirically Rocephin was initiated. Culture and sensitivity revealed greater than 100,000 colonies of Enterococcus faecalis and 50,000 colonies of Stenotrophomonas maltophilia.  Patient was noted to be in atrial fibrillation with RVR that was noted to be rate controlled with metoprolol.  He was determined not to be a appropriate candidate for anticoagulation prior to being sent to [facility] facility for rehab.  ED Course: Upon admission into the emergency department patient was noted to be afebrile, pulse 126-145, respirations 18-24, blood pressures as low as 78/55 with improvement after initial fluid boluses up to 99/72, and O2 saturations maintained.  Labs significant for WBC 14.7,  hemoglobin 7.8, potassium 6.2, BUN 90, creatinine 2.13, albumin 2.3, AST 62, ALT 82 and INR 1.3.  COVID-19 screening and influenza screening were negative. He was typed and screened for blood products and ordered to be transfused 2 units of packed red blood cells.  GI had been formally consulted.  Patient had been bolused 2 L of lactated Ringer's and ordered Protonix 80 mg IV.  TRH called to admit.    Review of Systems  Constitutional:  Positive for malaise/fatigue.  HENT:  Positive for sore throat.   Respiratory:  Negative for shortness of breath.   Cardiovascular:  Positive for leg swelling. Negative for chest pain.  Gastrointestinal:  Positive for nausea and vomiting. Negative for blood in stool.  Skin:        Positive for wounds   Past Medical History:  Diagnosis Date   Coronary artery disease    Hyperlipidemia    Obesity    Penile cancer (HCC)    Swelling of extremity    Left Leg    Past Surgical History:  Procedure Laterality Date   APPENDECTOMY     BACK SURGERY     CHOLECYSTECTOMY     CORONARY ANGIOPLASTY  11/2005   RCA PCI AND STENTING WITH A CYPHER DES   CORONARY ARTERY BYPASS GRAFT     HIATAL HERNIA REPAIR     lymph node removal     NM MYOCAR MULTIPLE W/SPECT  11/26/2009   EF 62%. NORMAL MYOCARDIAL PERFUSION STUDY.   TRANSTHORACIC ECHOCARDIOGRAM  12/22/2005   MILD TO MOD AORTIC SCLEROSIS W/O STENOSIS. MILD TO MODERATE MITRAL CALCIFICATION. LA- MILDLY DILATED.     reports that he quit smoking about 38  years ago. His smoking use included cigarettes. He has never used smokeless tobacco. He reports that he does not drink alcohol and does not use drugs.  No Known Allergies  Family History  Problem Relation Age of Onset   Cancer Father    Cancer Sister    Cancer Brother    Cancer Sister    Cancer Other     Prior to Admission medications   Medication Sig Start Date End Date Taking? Authorizing Provider  acetaminophen (TYLENOL) 325 MG tablet Take 1-2 tablets  (325-650 mg total) by mouth every 6 (six) hours as needed for fever, headache, mild pain or moderate pain. 01/14/16   Nat Christen, PA-C  Ascorbic Acid (VITAMIN C) 1000 MG tablet Take 1,000 mg by mouth daily.     [provider]  aspirin EC 81 MG tablet Take 81 mg by mouth daily.    [provider]  Calcium Carbonate-Vitamin D (CALCIUM-D PO) Take 1 tablet by mouth daily.    [provider]  clopidogrel (PLAVIX) 75 MG tablet TAKE 1 TABLET EVERY DAY 03/12/21   Lorretta Harp, MD  fluconazole (DIFLUCAN) 100 MG tablet Take 1 tablet (100 mg total) by mouth daily for 10 days. 07/16/21 07/26/21  Manuella Ghazi, Pratik D, DO  lactulose (CHRONULAC) 10 GM/15ML solution Take by mouth 2 (two) times daily as needed for mild constipation.    [provider]  metoprolol tartrate (LOPRESSOR) 25 MG tablet TAKE 1 TABLET TWICE DAILY (NEED MD APPOINTMENT) 03/06/21   Lorretta Harp, MD  Multiple Vitamin (MULTIVITAMIN WITH MINERALS) TABS tablet Take 1 tablet by mouth daily. Centrum Silver    [provider]  Multiple Vitamins-Minerals (PRESERVISION AREDS 2) CAPS Take by mouth.    [provider]  polyethylene glycol powder (GLYCOLAX/MIRALAX) powder Take 17 g by mouth daily as needed for mild constipation or moderate constipation.    [provider]  simvastatin (ZOCOR) 20 MG tablet TAKE 1 TABLET AT BEDTIME 09/12/20   Lorretta Harp, MD    Physical Exam:  Constitutional: Elderly male who appears ill Vitals:   07/19/21 0630 07/19/21 0645 07/19/21 0700 07/19/21 0715  BP: (!) 84/62 (!) '78/55 99/72 95/67 '$  Pulse:  (!) 126  (!) 129  Resp: (!) 24 (!) '23 20 20  '$ Temp:      TempSrc:      SpO2:  96%  95%   Eyes: PERRL, lids and conjunctivae normal ENMT: Mucous membranes are dry. Posterior pharynx clear of any exudate or lesions.  Neck: normal, supple, no masses, no thyromegaly Respiratory: clear to auscultation bilaterally, no wheezing, no crackles. Normal  respiratory effort. No accessory muscle use.  Cardiovascular: Irregular irregular and tachycardic 2+ pitting bilateral lower extremity edema. 2+ pedal pulses. No carotid bruits.  Abdomen: no tenderness, no masses palpated. No hepatosplenomegaly. Bowel sounds positive.  Stool: Patient has dark tarry stools present in his brief. Musculoskeletal: no clubbing / cyanosis. No joint deformity upper and lower extremities. Good ROM, no contractures. Normal muscle tone.  Skin: Pallor present.  Bruising noted of the bilateral upper extremities stage I pressure wounds noted of the sacrum and upper left back.  Wounds noted of the bilateral toes with some scabs present and mild surrounding erythema.  Chronic venous stasis changes to the lower extremities. Neurologic: CN 2-12 grossly intact.  Strength 4/5 in all 4.  Psychiatric: Normal judgment and insight. Alert and oriented x person, place, and situation.  Normal mood.     Labs on Admission:  I have personally reviewed following labs and imaging studies  CBC: Recent Labs  Lab 07/13/21 0225 07/14/21 0523 07/19/21 0603 07/19/21 0643  WBC 8.0 6.7 14.7*  --   NEUTROABS  --   --  12.5*  --   HGB 9.3* 10.0* 7.8* 8.2*  HCT 29.4* 31.4* 25.4* 24.0*  MCV 100.3* 103.0* 103.7*  --   PLT 185 209 223  --    Basic Metabolic Panel: Recent Labs  Lab 07/13/21 0225 07/14/21 0523 07/15/21 0441 07/19/21 0603 07/19/21 0643  NA 136 136 135 138 136  K 4.1 4.5 4.5 6.2* 6.0*  CL 110 108 108 108 108  CO2 21* 23 22 19*  --   GLUCOSE 105* 99 100* 125* 120*  BUN 47* 35* 32* 90* 92*  CREATININE 1.26* 1.23 1.22 2.13* 2.10*  CALCIUM 7.8* 8.2* 8.0* 8.8*  --   MG 2.1 2.1 1.9  --   --    GFR: Estimated Creatinine Clearance: 26.3 mL/min (A) (by C-G formula based on SCr of 2.1 mg/dL (H)). Liver Function Tests: Recent Labs  Lab 07/13/21 0225 07/14/21 0523 07/15/21 0441 07/19/21 0603  AST 49* 46* 45* 62*  ALT 52* 54* 55* 82*  ALKPHOS 40 42 38 43  BILITOT 0.5 0.5  0.5 0.7  PROT 4.9* 5.2* 4.9* 5.1*  ALBUMIN 2.4* 2.4* 2.3* 2.3*   No results for input(s): LIPASE, AMYLASE in the last 168 hours. No results for input(s): AMMONIA in the last 168 hours. Coagulation Profile: Recent Labs  Lab 07/19/21 0652  INR 1.3*   Cardiac Enzymes: Recent Labs  Lab 07/13/21 0225 07/14/21 0523 07/15/21 0441  CKTOTAL 393 150 91   BNP (last 3 results) No results for input(s): PROBNP in the last 8760 hours. HbA1C: No results for input(s): HGBA1C in the last 72 hours. CBG: No results for input(s): GLUCAP in the last 168 hours. Lipid Profile: No results for input(s): CHOL, HDL, LDLCALC, TRIG, CHOLHDL, LDLDIRECT in the last 72 hours. Thyroid Function Tests: No results for input(s): TSH, T4TOTAL, FREET4, T3FREE, THYROIDAB in the last 72 hours. Anemia Panel: No results for input(s): VITAMINB12, FOLATE, FERRITIN, TIBC, IRON, RETICCTPCT in the last 72 hours. Urine analysis:    Component Value Date/Time   COLORURINE YELLOW 07/11/2021 1259   APPEARANCEUR CLOUDY (A) 07/11/2021 1259   LABSPEC 1.019 07/11/2021 1259   PHURINE 7.0 07/11/2021 1259   GLUCOSEU NEGATIVE 07/11/2021 1259   HGBUR SMALL (A) 07/11/2021 1259   BILIRUBINUR NEGATIVE 07/11/2021 1259   BILIRUBINUR negative 03/25/2020 1156   KETONESUR 20 (A) 07/11/2021 1259   PROTEINUR 30 (A) 07/11/2021 1259   UROBILINOGEN 0.2 03/25/2020 1156   NITRITE NEGATIVE 07/11/2021 1259   LEUKOCYTESUR MODERATE (A) 07/11/2021 1259   Sepsis Labs: Recent Results (from the past 240 hour(s))  Urine Culture     Status: Abnormal   Collection Time: 07/11/21 12:59 PM   Specimen: Urine, Catheterized  Result Value Ref Range Status   Specimen Description   Final    URINE, CATHETERIZED Performed at [facility], [address], [place], [place] [address]    Special Requests   Final    NONE Performed at [facility], [address] [place], [place] [address]    Culture MULTIPLE SPECIES PRESENT, SUGGEST RECOLLECTION (A)  Final    Report Status 07/12/2021 FINAL  Final  Resp Panel by RT-PCR (Flu A&B, Covid) Nasopharyngeal Swab     Status: None   Collection Time: 07/11/21  5:47 PM   Specimen: Nasopharyngeal Swab; Nasopharyngeal(NP)  swabs in vial transport medium  Result Value Ref Range Status   SARS Coronavirus 2 by RT PCR NEGATIVE NEGATIVE Final    Comment: (NOTE) SARS-CoV-2 target nucleic acids are NOT DETECTED.  The SARS-CoV-2 RNA is generally detectable in upper respiratory specimens during the acute phase of infection. The lowest concentration of SARS-CoV-2 viral copies this assay can detect is 138 copies/mL. A negative result does not preclude SARS-Cov-2 infection and should not be used as the sole basis for treatment or other patient management decisions. A negative result may occur with  improper specimen collection/handling, submission of specimen other than nasopharyngeal swab, presence of viral mutation(s) within the areas targeted by this assay, and inadequate number of viral copies(<138 copies/mL). A negative result must be combined with clinical observations, patient history, and epidemiological information. The expected result is Negative.  Fact Sheet for Patients:  EntrepreneurPulse.com.au  Fact Sheet for Healthcare Providers:  IncredibleEmployment.be  This test is no t yet approved or cleared by the [place] FDA and  has been authorized for detection and/or diagnosis of SARS-CoV-2 by FDA under an Emergency Use Authorization (EUA). This EUA will remain  in effect (meaning this test can be used) for the duration of the COVID-19 declaration under Section 564(b)(1) of the Act, 21 U.S.C.section 360bbb-3(b)(1), unless the authorization is terminated  or revoked sooner.       Influenza A by PCR NEGATIVE NEGATIVE Final   Influenza B by PCR NEGATIVE NEGATIVE Final    Comment: (NOTE) The Xpert Xpress SARS-CoV-2/FLU/RSV plus assay is intended as an aid in  the diagnosis of influenza from Nasopharyngeal swab specimens and should not be used as a sole basis for treatment. Nasal washings and aspirates are unacceptable for Xpert Xpress SARS-CoV-2/FLU/RSV testing.  Fact Sheet for Patients: EntrepreneurPulse.com.au  Fact Sheet for Healthcare Providers: IncredibleEmployment.be  This test is not yet approved or cleared by the [place] FDA and has been authorized for detection and/or diagnosis of SARS-CoV-2 by FDA under an Emergency Use Authorization (EUA). This EUA will remain in effect (meaning this test can be used) for the duration of the COVID-19 declaration under Section 564(b)(1) of the Act, 21 U.S.C. section 360bbb-3(b)(1), unless the authorization is terminated or revoked.  Performed at [facility], [address] [place], [place] [address]   Urine Culture     Status: Abnormal   Collection Time: 07/13/21 10:30 AM   Specimen: Urine, Clean Catch  Result Value Ref Range Status   Specimen Description   Final    URINE, CLEAN CATCH Performed at [facility], [address] [place], [place] [address]    Special Requests   Final    NONE Performed at [facility], [address] [place],  [address]    Culture (A)  Final    >=100,000 COLONIES/mL ENTEROCOCCUS FAECALIS 50,000 COLONIES/mL STENOTROPHOMONAS MALTOPHILIA    Report Status 07/17/2021 FINAL  Final   Organism ID, Bacteria ENTEROCOCCUS FAECALIS (A)  Final   Organism ID, Bacteria STENOTROPHOMONAS MALTOPHILIA (A)  Final      Susceptibility   Enterococcus faecalis - MIC*    AMPICILLIN <=2 SENSITIVE Sensitive     NITROFURANTOIN <=16 SENSITIVE Sensitive     VANCOMYCIN 1 SENSITIVE Sensitive     * >=100,000 COLONIES/mL ENTEROCOCCUS FAECALIS   Stenotrophomonas maltophilia - MIC*    LEVOFLOXACIN 1 SENSITIVE Sensitive     TRIMETH/SULFA <=20 SENSITIVE Sensitive     * 50,000 COLONIES/mL STENOTROPHOMONAS MALTOPHILIA  Resp Panel by RT-PCR  (Flu A&B, Covid) Nasopharyngeal  Swab     Status: None   Collection Time: 07/15/21 11:13 AM   Specimen: Nasopharyngeal Swab; Nasopharyngeal(NP) swabs in vial transport medium  Result Value Ref Range Status   SARS Coronavirus 2 by RT PCR NEGATIVE NEGATIVE Final    Comment: (NOTE) SARS-CoV-2 target nucleic acids are NOT DETECTED.  The SARS-CoV-2 RNA is generally detectable in upper respiratory specimens during the acute phase of infection. The lowest concentration of SARS-CoV-2 viral copies this assay can detect is 138 copies/mL. A negative result does not preclude SARS-Cov-2 infection and should not be used as the sole basis for treatment or other patient management decisions. A negative result may occur with  improper specimen collection/handling, submission of specimen other than nasopharyngeal swab, presence of viral mutation(s) within the areas targeted by this assay, and inadequate number of viral copies(<138 copies/mL). A negative result must be combined with clinical observations, patient history, and epidemiological information. The expected result is Negative.  Fact Sheet for Patients:  EntrepreneurPulse.com.au  Fact Sheet for Healthcare Providers:  IncredibleEmployment.be  This test is no t yet approved or cleared by the [place] FDA and  has been authorized for detection and/or diagnosis of SARS-CoV-2 by FDA under an Emergency Use Authorization (EUA). This EUA will remain  in effect (meaning this test can be used) for the duration of the COVID-19 declaration under Section 564(b)(1) of the Act, 21 U.S.C.section 360bbb-3(b)(1), unless the authorization is terminated  or revoked sooner.       Influenza A by PCR NEGATIVE NEGATIVE Final   Influenza B by PCR NEGATIVE NEGATIVE Final    Comment: (NOTE) The Xpert Xpress SARS-CoV-2/FLU/RSV plus assay is intended as an aid in the diagnosis of influenza from Nasopharyngeal swab specimens  and should not be used as a sole basis for treatment. Nasal washings and aspirates are unacceptable for Xpert Xpress SARS-CoV-2/FLU/RSV testing.  Fact Sheet for Patients: EntrepreneurPulse.com.au  Fact Sheet for Healthcare Providers: IncredibleEmployment.be  This test is not yet approved or cleared by the [place] FDA and has been authorized for detection and/or diagnosis of SARS-CoV-2 by FDA under an Emergency Use Authorization (EUA). This EUA will remain in effect (meaning this test can be used) for the duration of the COVID-19 declaration under Section 564(b)(1) of the Act, 21 U.S.C. section 360bbb-3(b)(1), unless the authorization is terminated or revoked.  Performed at [facility], [address] [place], [place] [address]      Radiological Exams on Admission: No results found.  EKG: Independently reviewed.  Atrial fibrillation with RVR 148 bpm.  Assessment/Plan Acute blood loss anemia secondary to hematemesis with nausea: Patient presents from skilled nursing facility with complaints of vomiting blood.  Hemoglobin acutely dropped from 10 g/dL 5 days ago to 7.8 g/dL on admission.  Patient was typed and screened and ordered 2 units of packed red blood cells along with given a bolus of Protonix 80 mg IV. -Admit to a progressive bed -Aspiration precautions with elevation of the head -N.p.o. -Hold aspirin and Plavix -Check chest x-ray -Continue with transfusion of 2 units of packed red blood cells -Serial monitoring of H&H and we will transfuse blood products as needed to keep hemoglobin greater than 8 g/dL -Continue Protonix drip -Antiemetics as needed -GI consulted,  will follow-up for further recommendations  Transient hypotension: On admission blood pressures noted to be as low as 78/55.  Blood pressures improved after IV fluid boluses.  Home blood pressure medications include metoprolol 25 mg twice daily.  Suspect symptoms likely  secondary to acute blood loss, but is not totally clear last dose of metoprolol. -Goal MAP greater than or equal to 65 -Hold metoprolol until bleeding is controlled and MAP stable  Atrial fibrillation with RVR: Patient had been noted to go in atrial fibrillation with RVR during last hospitalization, but was controlled with metoprolol.  -Holding metoprolol at this time due to low blood pressure -Amiodarone drip to try and obtain rate control in the acute setting due to hypotension  Leukocytosis SIRS: Acute.  WBC elevated at 14.7.  Patient was noted to be tachycardic and tachypneic, but suspect more so associated with patient's presenting symptoms as opposed to infection. -Continue to monitor, and would likely start on empiric antibiotics if patient develops fever  Hyperkalemia: Acute.  Initial potassium was elevated at 6.2.  Patient had been given 2 L normal saline IV fluids. -Lasix 20 mg IV after first unit of blood transfused if MAP remains stable. -Recheck potassium levels  Acute kidney injury superimposed on chronic kidney disease stage IIIa: Patient presents with creatinine elevated up to 2.13 with BUN 90.  Baseline creatinine previously was around 1.2.  Given elevated BUN to creatinine ratio acute kidney injury is thought to be prerenal in nature in relation to acute blood loss. -Check repeat urinalysis -Continue to monitor kidney function daily  Heart failure with preserved EF: On physical exam patient with at least 2+ pitting lower extremity edema, but lungs sound relatively clear.  Last EF was noted to be around 65-70% on 7/16. -Strict intake and output -Daily weights  Recent urinary tract infection: Patient was just recently admitted on 7/15 and treated for urinary tract infection however urine cultures grown out100,000 colonies of Enterococcus faecalis and 50,000 colonies of Stenotrophomonas maltophilia.  He had initially been started on Rocephin and switched over to [facility] at  discharge for 3 more days completed on 7/21, and continued on Diflucan to complete a 10-day course for Candida UTI to complete on 7/29. -Follow-up repeat urinalysis -Empiric antibiotics  History of penile cancer and neurogenic bladder: Patient has urinary catheter in place.  Plans had been for the patient to follow-up with Dr. Alyson Ingles of urology on 7/20 which the appointment was missed. -Patient needs to be rescheduled outpatient follow-up with Dr. Alyson Ingles of urology, but may require formal consult while here in  Pressure ulcers: Patient noted to have stage I pressure ulcers of the upper left back as well as sacrum secondary to recently rhabdomyolysis. -Wound care consult  Lower extremity edema: Acute on chronic.  Noted to have least 2+ pitting edema of the bilateral lower extremities.  Patient makes it seem like this is chronic. -Continue to monitor, but will possibly need further IV diuresis  Coronary artery disease -Hold Plavix and aspirin  Protein calorie malnutrition: Albumin 2.3 on admission. -Check prealbumin tomorrow morning  Transaminitis: Acute.  On admission AST 62 and ALT 82.  Suspect secondary to patient's presenting complaint. -Recheck CMP tomorrow  Gait disturbance -Will need to be evaluated by PT/OT once acute medical problems resolve  Hyperlipidemia -Will need to resume simvastatin when medically appropriate  Obesity: BMI 33.66 kg/m  DVT prophylaxis: SCDs Code Status: Full Family Communication: Son updated over the phone Disposition Plan: Likely discharge back to rehab Consults called: GI Admission status: Inpatient, require more than 2 midnight stay Norval Morton MD [facility]   If 7PM-7AM, please contact night-coverage   07/19/2021, 7:49 AM

## 2021-07-19 NOTE — ED Provider Notes (Signed)
[facility] EMERGENCY DEPARTMENT Provider Note   CSN: [id number] Arrival date & time: 07/19/21  0548     History Chief Complaint  Patient presents with   GI Problem    EMS arrival. Came from [place]. Staff called bleed tachy HR 130s. Plavix. AxO 4. Denies SOB, pain.  Level 5 caveat due to acuity of condition Caleb [name] is a 87 y.o. male.  The history is provided by the patient and the nursing home. The history is limited by the condition of the patient.  GI Problem This is a new problem. The problem occurs constantly. Nothing aggravates the symptoms. Nothing relieves the symptoms.     Patient history of CAD, obesity, hyperlipidemia presents from local nursing facility.  It is reported that patient had multiple episodes of vomiting that appeared bloody.  He also had a bloody bowel movement. Past Medical History:  Diagnosis Date   Coronary artery disease    Hyperlipidemia    Obesity    Penile cancer ([place])    Swelling of extremity    Left Leg    Patient Active Problem List   Diagnosis Date Noted   CKD (chronic kidney disease), stage III ([place]) 07/17/2021   Macrocytic anemia 07/17/2021   Atrial fibrillation ([place]) 07/17/2021   Pressure ulcers of skin of multiple topographic sites 07/17/2021   Rhabdomyolysis 07/11/2021   Fall at home, initial encounter 07/11/2021   Acute lower UTI 07/11/2021   Urinary retention 03/27/2020   Exudative age-related macular degeneration, right eye, with active choroidal neovascularization ([place]) 08/23/2017   BMI 32.0-32.9,adult 02/26/2016   Neurogenic bladder 02/26/2016   Scalp hematoma 01/13/2016   Self-catheterizes urinary bladder 01/13/2016   Pneumothorax, left 01/12/2016   Atypical chest pain 12/02/2015   Muscular deconditioning 02/20/2015   History of penile cancer 01/09/2015   Cellulitis 05/17/2014   Pain in limb- Slight pain- Left Leg 05/03/2014   Discoloration of skin-Left Leg 05/03/2014   Stasis edema with  ulcer ([place]) 08/17/2013   Peripheral vascular disease, unspecified ([place]) 07/13/2013   Swelling of limb 07/13/2013   Coronary artery disease 06/07/2013   Left upper extremity numbness 06/07/2013   Hyperlipidemia    Knee pain 08/11/2011    Past Surgical History:  Procedure Laterality Date   APPENDECTOMY     BACK SURGERY     CHOLECYSTECTOMY     CORONARY ANGIOPLASTY  11/2005   RCA PCI AND STENTING WITH A CYPHER DES   CORONARY ARTERY BYPASS GRAFT     HIATAL HERNIA REPAIR     lymph node removal     NM MYOCAR MULTIPLE W/SPECT  11/26/2009   EF 62%. NORMAL MYOCARDIAL PERFUSION STUDY.   TRANSTHORACIC ECHOCARDIOGRAM  12/22/2005   MILD TO MOD AORTIC SCLEROSIS W/O STENOSIS. MILD TO MODERATE MITRAL CALCIFICATION. LA- MILDLY DILATED.       Family History  Problem Relation Age of Onset   Cancer Father    Cancer Sister    Cancer Brother    Cancer Sister    Cancer Other     Social History   Tobacco Use   Smoking status: Former    Types: Cigarettes    Quit date: 07/14/1983    Years since quitting: 38.0   Smokeless tobacco: Never  Vaping Use   Vaping Use: Never used  Substance Use Topics   Alcohol use: No   Drug use: No    Home Medications Prior to Admission medications   Medication Sig Start Date End Date Taking? Authorizing Provider  acetaminophen (TYLENOL) 325 MG tablet Take 1-2 tablets (325-650 mg total) by mouth every 6 (six) hours as needed for fever, headache, mild pain or moderate pain. 01/14/16   Nat Christen, PA-C  Ascorbic Acid (VITAMIN C) 1000 MG tablet Take 1,000 mg by mouth daily.     [provider]  aspirin EC 81 MG tablet Take 81 mg by mouth daily.    [provider]  Calcium Carbonate-Vitamin D (CALCIUM-D PO) Take 1 tablet by mouth daily.    [provider]  clopidogrel (PLAVIX) 75 MG tablet TAKE 1 TABLET EVERY DAY 03/12/21   Lorretta Harp, MD  fluconazole (DIFLUCAN) 100 MG tablet Take 1 tablet (100 mg total) by mouth daily for 10  days. 07/16/21 07/26/21  Manuella Ghazi, Pratik D, DO  lactulose (CHRONULAC) 10 GM/15ML solution Take by mouth 2 (two) times daily as needed for mild constipation.    [provider]  metoprolol tartrate (LOPRESSOR) 25 MG tablet TAKE 1 TABLET TWICE DAILY (NEED MD APPOINTMENT) 03/06/21   Lorretta Harp, MD  Multiple Vitamin (MULTIVITAMIN WITH MINERALS) TABS tablet Take 1 tablet by mouth daily. Centrum Silver    [provider]  Multiple Vitamins-Minerals (PRESERVISION AREDS 2) CAPS Take by mouth.    [provider]  polyethylene glycol powder (GLYCOLAX/MIRALAX) powder Take 17 g by mouth daily as needed for mild constipation or moderate constipation.    [provider]  simvastatin (ZOCOR) 20 MG tablet TAKE 1 TABLET AT BEDTIME 09/12/20   Lorretta Harp, MD    Allergies    Patient has no known allergies.  Review of Systems   Review of Systems  Unable to perform ROS: Acuity of condition   Physical Exam Updated Vital Signs BP (!) 88/63   Pulse (!) 145   Temp 97.7 F (36.5 C) (Oral)   Resp 18   SpO2 98%   Physical Exam CONSTITUTIONAL: Elderly, ill-appearing HEAD: Normocephalic/atraumatic EYES: EOMI/PERRL, conjunctiva pale ENMT: Mucous membranes moist NECK: supple no meningeal signs SPINE/BACK:entire spine nontender CV: Tachycardic and irregular LUNGS: Lungs are clear to auscultation bilaterally, no apparent distress ABDOMEN: soft, nontender, no rebound or guarding, bowel sounds noted throughout abdomen Rectal-no blood or melena noted, chaperone present GU: Foley catheter in place on arrival NEURO: Pt is awake/alert/appropriate, moves all extremitiesx4.  No facial droop.   EXTREMITIES: pulses normal/equal, full ROM SKIN: Pale, & skin is cool to touch PSYCH: Mildly anxious ED Results / Procedures / Treatments   Labs (all labs ordered are listed, but only abnormal results are displayed) Labs Reviewed  CBC WITH DIFFERENTIAL/PLATELET - Abnormal; Notable  for the following components:      Result Value   WBC 14.7 (*)    RBC 2.45 (*)    Hemoglobin 7.8 (*)    HCT 25.4 (*)    MCV 103.7 (*)    RDW 16.0 (*)    Neutro Abs 12.5 (*)    Abs Immature Granulocytes 0.10 (*)    All other components within normal limits  COMPREHENSIVE METABOLIC PANEL - Abnormal; Notable for the following components:   Potassium 6.2 (*)    CO2 19 (*)    Glucose, Bld 125 (*)    BUN 90 (*)    Creatinine, Ser 2.13 (*)    Calcium 8.8 (*)    Total Protein 5.1 (*)    Albumin 2.3 (*)    AST 62 (*)    ALT 82 (*)    GFR, Estimated 28 (*)  All other components within normal limits  I-STAT CHEM 8, ED - Abnormal; Notable for the following components:   Potassium 6.0 (*)    BUN 92 (*)    Creatinine, Ser 2.10 (*)    Glucose, Bld 120 (*)    Calcium, Ion 1.06 (*)    TCO2 21 (*)    Hemoglobin 8.2 (*)    HCT 24.0 (*)    All other components within normal limits  RESP PANEL BY RT-PCR (FLU A&B, COVID) ARPGX2  PROTIME-INR  POC OCCULT BLOOD, ED  PREPARE RBC (CROSSMATCH)  TYPE AND SCREEN  ABO/RH    EKG EKG Interpretation  Date/Time:  Saturday July 19 2021 05:55:36 EDT Ventricular Rate:  148 PR Interval:    QRS Duration: 90 QT Interval:  301 QTC Calculation: 473 R Axis:   52 Text Interpretation: Atrial fibrillation with rapid V-rate Borderline low voltage, extremity leads Abnormal ekg Confirmed by Ripley Fraise [phone]) on 07/19/2021 6:03:42 AM  Radiology No results found.  Procedures .Critical Care  Date/Time: 07/19/2021 6:35 AM Performed by: Ripley Fraise, MD Authorized by: Ripley Fraise, MD   Critical care provider statement:    Critical care time (minutes):  55   Critical care start time:  07/19/2021 6:35 AM   Critical care end time:  07/19/2021 7:30 AM   Critical care time was exclusive of:  Separately billable procedures and treating other patients   Critical care was necessary to treat or prevent imminent or life-threatening deterioration of  the following conditions:  Shock   Critical care was time spent personally by me on the following activities:  Examination of patient, evaluation of patient's response to treatment, ordering and review of laboratory studies, pulse oximetry, re-evaluation of patient's condition, discussions with consultants, development of treatment plan with patient or surrogate, obtaining history from patient or surrogate, review of old charts and ordering and performing treatments and interventions   I assumed direction of critical care for this patient from another provider in my specialty: no     Medications Ordered in ED Medications  0.9 %  sodium chloride infusion (has no administration in time range)  lactated ringers bolus 1,000 mL (has no administration in time range)  lactated ringers bolus 1,000 mL (1,000 mLs Intravenous New Bag/Given 07/19/21 0650)    ED Course  I have reviewed the triage vital signs and the nursing notes.  Pertinent labs & imaging results that were available during my care of the patient were reviewed by me and considered in my medical decision making (see chart for details).    MDM Rules/Calculators/A&P                           6:35 AM Patient presents from local nursing home.  He was recently admitted to the hospital for UTI and rhabdomyolysis.  He now presents with concerns for GI bleed.  I spoke to his nurse [place]  at the facility and she reports multiple episodes of bloody emesis and 1 episode of bloody stool Patient is ill-appearing.  There are no known religious objections to receiving blood products.  Patient will require admission to the hospital 7:25 AM After IV access, patient is improving with IV fluids.  Blood pressure is now 99.  Patient is still atrial fibrillation with RVR He will be given blood products for his acute anemia from suspected GI bleed. Patient is mildly hyperkalemic, but he has been given IV fluids 7:31 AM Discussed with Dr. Bryan Lemma  with  gastroenterology The GI team will see patient in the ER. 7:39 AM At signout to Dr. Vallery Ridge, plan is the following: Call report to hospitalist.  Patient received IV fluids and blood products have been ordered.  Patient's blood pressure and heart rate are responding to volume. Protonix has been ordered.  Final Clinical Impression(s) / ED Diagnoses Final diagnoses:  Acute GI bleeding  Acute anemia  AKI (acute kidney injury) [facility])    Rx / DC Orders ED Discharge Orders     None        Ripley Fraise, MD 07/19/21 0740

## 2021-07-19 NOTE — ED Notes (Signed)
Increased swelling and bruising noted around IV site where blood had been infusing in left upper arm; pt denies pain; notified Dr. Tamala Julian, who assessed pt and advised to administer 2nd unit of blood in other IV site; since IV access limited, dr. Tamala Julian advised to stop amiodarone for now and use that iv for blood transfusion; Dr. Silas Flood notified as well, acknowledges plan; advised to have IV team try and place 3rd IV; IV removed from LUE; will continue to monitor

## 2021-07-19 NOTE — ED Provider Notes (Signed)
GI consulted.  Will be seen by Dr. Bryan Lemma Awaiting return call from hospitalist for admission.  Physical Exam  BP 95/67   Pulse (!) 129   Temp 97.7 F (36.5 C) (Oral)   Resp 20   SpO2 95%   Physical Exam  ED Course/Procedures     Procedures  MDM    Dr Tamala Julian accepts for admission     Charlesetta Shanks, MD 07/19/21 [phone]

## 2021-07-19 NOTE — ED Notes (Signed)
Pt had another large episode of melena; pt cleaned, brief changed; GI notified

## 2021-07-20 DIAGNOSIS — E875 Hyperkalemia: Secondary | ICD-10-CM | POA: Diagnosis not present

## 2021-07-20 DIAGNOSIS — I4891 Unspecified atrial fibrillation: Secondary | ICD-10-CM

## 2021-07-20 DIAGNOSIS — K920 Hematemesis: Secondary | ICD-10-CM | POA: Diagnosis not present

## 2021-07-20 DIAGNOSIS — D62 Acute posthemorrhagic anemia: Secondary | ICD-10-CM | POA: Diagnosis not present

## 2021-07-20 LAB — COMPREHENSIVE METABOLIC PANEL
ALT: 74 U/L — ABNORMAL HIGH (ref 0–44)
AST: 56 U/L — ABNORMAL HIGH (ref 15–41)
Albumin: 2.2 g/dL — ABNORMAL LOW (ref 3.5–5.0)
Alkaline Phosphatase: 36 U/L — ABNORMAL LOW (ref 38–126)
Anion gap: 11 (ref 5–15)
BUN: 92 mg/dL — ABNORMAL HIGH (ref 8–23)
CO2: 21 mmol/L — ABNORMAL LOW (ref 22–32)
Calcium: 8.5 mg/dL — ABNORMAL LOW (ref 8.9–10.3)
Chloride: 107 mmol/L (ref 98–111)
Creatinine, Ser: 1.98 mg/dL — ABNORMAL HIGH (ref 0.61–1.24)
GFR, Estimated: 31 mL/min — ABNORMAL LOW (ref >60)
Glucose, Bld: 105 mg/dL — ABNORMAL HIGH (ref 70–99)
Potassium: 5.1 mmol/L (ref 3.5–5.1)
Sodium: 139 mmol/L (ref 135–145)
Total Bilirubin: 0.7 mg/dL (ref 0.3–1.2)
Total Protein: 4.5 g/dL — ABNORMAL LOW (ref 6.5–8.1)

## 2021-07-20 LAB — CBC WITH DIFFERENTIAL/PLATELET
Abs Immature Granulocytes: 0.06 10*3/uL (ref 0.00–0.07)
Abs Immature Granulocytes: 0.07 10*3/uL (ref 0.00–0.07)
Basophils Absolute: 0 10*3/uL (ref 0.0–0.1)
Basophils Absolute: 0 10*3/uL (ref 0.0–0.1)
Basophils Relative: 0 %
Basophils Relative: 0 %
Eosinophils Absolute: 0 10*3/uL (ref 0.0–0.5)
Eosinophils Absolute: 0 10*3/uL (ref 0.0–0.5)
Eosinophils Relative: 0 %
Eosinophils Relative: 0 %
HCT: 23.7 % — ABNORMAL LOW (ref 39.0–52.0)
HCT: 25.3 % — ABNORMAL LOW (ref 39.0–52.0)
Hemoglobin: 8.3 g/dL — ABNORMAL LOW (ref 13.0–17.0)
Hemoglobin: 8.8 g/dL — ABNORMAL LOW (ref 13.0–17.0)
Immature Granulocytes: 1 %
Immature Granulocytes: 1 %
Lymphocytes Relative: 13 %
Lymphocytes Relative: 10 %
Lymphs Abs: 1.2 10*3/uL (ref 0.7–4.0)
Lymphs Abs: 1.2 10*3/uL (ref 0.7–4.0)
MCH: 31.6 pg (ref 26.0–34.0)
MCH: 32.1 pg (ref 26.0–34.0)
MCHC: 35 g/dL (ref 30.0–36.0)
MCHC: 34.8 g/dL (ref 30.0–36.0)
MCV: 90.1 fL (ref 80.0–100.0)
MCV: 92.3 fL (ref 80.0–100.0)
Monocytes Absolute: 1.2 10*3/uL — ABNORMAL HIGH (ref 0.1–1.0)
Monocytes Absolute: 1.4 10*3/uL — ABNORMAL HIGH (ref 0.1–1.0)
Monocytes Relative: 13 %
Monocytes Relative: 12 %
Neutro Abs: 6.8 10*3/uL (ref 1.7–7.7)
Neutro Abs: 9 10*3/uL — ABNORMAL HIGH (ref 1.7–7.7)
Neutrophils Relative %: 73 %
Neutrophils Relative %: 77 %
Platelets: 133 10*3/uL — ABNORMAL LOW (ref 150–400)
Platelets: 159 10*3/uL (ref 150–400)
RBC: 2.63 MIL/uL — ABNORMAL LOW (ref 4.22–5.81)
RBC: 2.74 MIL/uL — ABNORMAL LOW (ref 4.22–5.81)
RDW: 16.1 % — ABNORMAL HIGH (ref 11.5–15.5)
RDW: 17.1 % — ABNORMAL HIGH (ref 11.5–15.5)
WBC: 9.3 10*3/uL (ref 4.0–10.5)
WBC: 11.7 10*3/uL — ABNORMAL HIGH (ref 4.0–10.5)
nRBC: 0.6 % — ABNORMAL HIGH (ref 0.0–0.2)
nRBC: 0.4 % — ABNORMAL HIGH (ref 0.0–0.2)

## 2021-07-20 LAB — CBC
HCT: 25.6 % — ABNORMAL LOW (ref 39.0–52.0)
HCT: 19 % — ABNORMAL LOW (ref 39.0–52.0)
Hemoglobin: 8.3 g/dL — ABNORMAL LOW (ref 13.0–17.0)
Hemoglobin: 6.3 g/dL — CL (ref 13.0–17.0)
MCH: 31.9 pg (ref 26.0–34.0)
MCH: 32.5 pg (ref 26.0–34.0)
MCHC: 32.4 g/dL (ref 30.0–36.0)
MCHC: 33.2 g/dL (ref 30.0–36.0)
MCV: 98.5 fL (ref 80.0–100.0)
MCV: 97.9 fL (ref 80.0–100.0)
Platelets: 140 10*3/uL — ABNORMAL LOW (ref 150–400)
Platelets: 142 10*3/uL — ABNORMAL LOW (ref 150–400)
RBC: 2.6 MIL/uL — ABNORMAL LOW (ref 4.22–5.81)
RBC: 1.94 MIL/uL — ABNORMAL LOW (ref 4.22–5.81)
RDW: 17.2 % — ABNORMAL HIGH (ref 11.5–15.5)
RDW: 17.2 % — ABNORMAL HIGH (ref 11.5–15.5)
WBC: 11 10*3/uL — ABNORMAL HIGH (ref 4.0–10.5)
WBC: 14.5 10*3/uL — ABNORMAL HIGH (ref 4.0–10.5)
nRBC: 0.5 % — ABNORMAL HIGH (ref 0.0–0.2)
nRBC: 1 % — ABNORMAL HIGH (ref 0.0–0.2)

## 2021-07-20 LAB — BASIC METABOLIC PANEL
Anion gap: 10 (ref 5–15)
BUN: 88 mg/dL — ABNORMAL HIGH (ref 8–23)
CO2: 20 mmol/L — ABNORMAL LOW (ref 22–32)
Calcium: 8.6 mg/dL — ABNORMAL LOW (ref 8.9–10.3)
Chloride: 109 mmol/L (ref 98–111)
Creatinine, Ser: 1.9 mg/dL — ABNORMAL HIGH (ref 0.61–1.24)
GFR, Estimated: 32 mL/min — ABNORMAL LOW (ref >60)
Glucose, Bld: 102 mg/dL — ABNORMAL HIGH (ref 70–99)
Potassium: 5 mmol/L (ref 3.5–5.1)
Sodium: 139 mmol/L (ref 135–145)

## 2021-07-20 LAB — PREALBUMIN: Prealbumin: 17.5 mg/dL — ABNORMAL LOW (ref 18–38)

## 2021-07-20 LAB — GLUCOSE, CAPILLARY
Glucose-Capillary: 132 mg/dL — ABNORMAL HIGH (ref 70–99)
Glucose-Capillary: 70 mg/dL (ref 70–99)
Glucose-Capillary: 86 mg/dL (ref 70–99)
Glucose-Capillary: 90 mg/dL (ref 70–99)
Glucose-Capillary: 91 mg/dL (ref 70–99)
Glucose-Capillary: 99 mg/dL (ref 70–99)

## 2021-07-20 LAB — PREPARE RBC (CROSSMATCH)

## 2021-07-20 MED ORDER — AMIODARONE HCL 200 MG PO TABS
400.0000 mg | ORAL_TABLET | Freq: Three times a day (TID) | ORAL | Status: DC
  Administered 2021-07-20 – 2021-07-22 (×7): 400 mg via ORAL
  Filled 2021-07-20 (×6): qty 2

## 2021-07-20 MED ORDER — AMIODARONE HCL 200 MG PO TABS
400.0000 mg | ORAL_TABLET | Freq: Three times a day (TID) | ORAL | Status: DC
  Filled 2021-07-20: qty 2

## 2021-07-20 MED ORDER — AMIODARONE IV BOLUS ONLY 150 MG/100ML
150.0000 mg | Freq: Once | INTRAVENOUS | Status: AC
  Administered 2021-07-20: 150 mg via INTRAVENOUS
  Filled 2021-07-20: qty 100

## 2021-07-20 MED ORDER — LACTATED RINGERS IV BOLUS
1000.0000 mL | Freq: Once | INTRAVENOUS | Status: AC
  Administered 2021-07-20: 1000 mL via INTRAVENOUS

## 2021-07-20 MED ORDER — SODIUM CHLORIDE 0.9% IV SOLUTION: Freq: Once | INTRAVENOUS | Status: AC

## 2021-07-20 MED ORDER — AMIODARONE HCL 200 MG PO TABS: 400.0000 mg | ORAL_TABLET | Freq: Three times a day (TID) | ORAL | Status: DC

## 2021-07-20 NOTE — Progress Notes (Signed)
NAME:  Caleb [name], MRN:  [id number], DOB:  11-28-1925, LOS: 1 ADMISSION DATE:  07/19/2021, CONSULTATION DATE:  07/20/21 REFERRING MD:  GI, CHIEF COMPLAINT:  blood in mouth   History of Present Illness:  87 year old man on Plavix for history of CAD admitted after seeing blood in mouth, melenic stools present.  Patient resides in a nursing facility.  Admitted to the ICU at the request of GI in case of decompensation and emergent endoscopy.  Patient is difficult to obtain history from. Not forthcoming with answers.  Lives in nursing facility.  Complained of nausea and vomiting.  Noticed some bloody emesis or secretions in the mouth.  Maybe some hemoptysis not clear.  Continued nausea and vomiting.  Dark stools present.  EMS was called.  Heart rates tach to the 130s.  Atrial fibrillation with RVR.  Upon arrival to the ED, tachycardia, soft blood pressures.  Was received 2 L of crystalloid.  In addition 1 unit PRBC administered.  Second unit ordered.  GI consulted.  Recommend ICU admission.  Notably, hemoglobin down a couple points from recent discharge.  Continues to have melenic stools in the ED. Pertinent  Medical History  Afib, CAD PCI RCA 2006, [facility] Events: Including procedures, antibiotic start and stop dates in addition to other pertinent events   7/23 admitted with GI bleed 7/24 Hgb stable after 3u PRBC, IV PPI  Interim History / Subjective:  Afib RVR intermittent back to sinus. Cr and k better. Hgb stable.  Objective   Blood pressure (!) 107/59, pulse (!) 113, temperature (!) 97.5 F (36.4 C), temperature source Axillary, resp. rate 15, weight 110.6 kg, SpO2 97 %.        Intake/Output Summary (Last 24 hours) at 07/20/2021 0944 Last data filed at 07/20/2021 0700 Gross per 24 hour  Intake 1022.58 ml  Output 1300 ml  Net -277.42 ml    Filed Weights   07/19/21 2315 07/20/21 0404  Weight: 111.8 kg 110.6 kg    Examination: General: Elderly, gruff Eyes  EOMI, no icterus Neck: Supple, no JVP Cardiovascular: Irregularly irregular, no murmur Pulmonary: No work of breathing, on room air Skin: Easy bruising, no lesions Neuro: No weakness, sensation appears intact Psych: Standoffish, not forthcoming with answers, flat affect   Resolved Hospital Problem list     Assessment & Plan:  GI bleed: Favor upper bleed given reports of spitting up blood, "" hemoptysis".  Melena on exam. Vitals, Hgb stable. Holding on EGD per patient wishes and in effort to optimize prior to procedure. --Pantoprazole infusion --Status post 3 unit PRBC transfusion, hgb stable --Maintain 2 large-bore Ivs -- GI consult, appreciate assistance --N.p.o. in case of procedure, plan for clear liquids if PM hgb stable  Atrial fibrillation: ST and RVR intermittently. --PO amio  CAD: Status post PCI to RCA in 2006. --Hold Plavix in setting of GI bleed, unclear why patient is on Plavix this long-term, consider low-dose aspirin in the future given history of PCI and CAD for secondary prevention --hold lasix, metoprolol  Best Practice (right click and "Reselect all SmartList Selections" daily)   Diet/type: NPO w/ oral meds DVT prophylaxis: SCD GI prophylaxis: PPI Lines: N/A Foley:  N/A Code Status:  full code Last date of multidisciplinary goals of care discussion [n/a]  Labs   CBC: Recent Labs  Lab 07/14/21 0523 07/19/21 0603 07/19/21 0643 07/19/21 1807 07/20/21 0017 07/20/21 0703  WBC 6.7 14.7*  --   --  9.3 11.7*  NEUTROABS  --  12.5*  --   --  6.8 9.0*  HGB 10.0* 7.8* 8.2* 8.1* 8.3* 8.8*  HCT 31.4* 25.4* 24.0* 24.7* 23.7* 25.3*  MCV 103.0* 103.7*  --   --  90.1 92.3  PLT 209 223  --   --  133* 159     Basic Metabolic Panel: Recent Labs  Lab 07/14/21 0523 07/15/21 0441 07/19/21 0603 07/19/21 0643 07/19/21 1642 07/20/21 0017 07/20/21 0703  NA 136 135 138 136 139 139 139  K 4.5 4.5 6.2* 6.0* 6.0*  6.0* 5.1 5.0  CL 108 108 108 108 109 107 109   CO2 23 22 19*  --  19* 21* 20*  GLUCOSE 99 100* 125* 120* 112* 105* 102*  BUN 35* 32* 90* 92* 90* 92* 88*  CREATININE 1.23 1.22 2.13* 2.10* 1.97* 1.98* 1.90*  CALCIUM 8.2* 8.0* 8.8*  --  8.5* 8.5* 8.6*  MG 2.1 1.9  --   --   --   --   --     GFR: Estimated Creatinine Clearance: 29.6 mL/min (A) (by C-G formula based on SCr of 1.9 mg/dL (H)). Recent Labs  Lab 07/14/21 0523 07/19/21 0603 07/20/21 0017 07/20/21 0703  WBC 6.7 14.7* 9.3 11.7*     Liver Function Tests: Recent Labs  Lab 07/14/21 0523 07/15/21 0441 07/19/21 0603 07/20/21 0017  AST 46* 45* 62* 56*  ALT 54* 55* 82* 74*  ALKPHOS 42 38 43 36*  BILITOT 0.5 0.5 0.7 0.7  PROT 5.2* 4.9* 5.1* 4.5*  ALBUMIN 2.4* 2.3* 2.3* 2.2*    No results for input(s): LIPASE, AMYLASE in the last 168 hours. No results for input(s): AMMONIA in the last 168 hours.  ABG    Component Value Date/Time   TCO2 21 (L) 07/19/2021 0643      Coagulation Profile: Recent Labs  Lab 07/19/21 0652  INR 1.3*     Cardiac Enzymes: Recent Labs  Lab 07/14/21 0523 07/15/21 0441  CKTOTAL 150 91     HbA1C: No results found for: HGBA1C  CBG: Recent Labs  Lab 07/19/21 1547 07/19/21 1933 07/19/21 2321 07/20/21 0341 07/20/21 0758  GLUCAP 113* 137* 97 91 90    Review of Systems:   No chest pain. Denies SOB. Comprehensive ROS otherwise negative.   Past Medical History:  He,  has a past medical history of Coronary artery disease, Hyperlipidemia, Obesity, Penile cancer ([place]), and Swelling of extremity.   Surgical History:   Past Surgical History:  Procedure Laterality Date   APPENDECTOMY     BACK SURGERY     CHOLECYSTECTOMY     CORONARY ANGIOPLASTY  11/2005   RCA PCI AND STENTING WITH A CYPHER DES   CORONARY ARTERY BYPASS GRAFT     HIATAL HERNIA REPAIR     lymph node removal     NM MYOCAR MULTIPLE W/SPECT  11/26/2009   EF 62%. NORMAL MYOCARDIAL PERFUSION STUDY.   TRANSTHORACIC ECHOCARDIOGRAM  12/22/2005   MILD TO MOD  AORTIC SCLEROSIS W/O STENOSIS. MILD TO MODERATE MITRAL CALCIFICATION. LA- MILDLY DILATED.     Social History:   reports that he quit smoking about 38 years ago. His smoking use included cigarettes. He has never used smokeless tobacco. He reports that he does not drink alcohol and does not use drugs.   Family History:  His family history includes Cancer in his brother, father, sister, sister, and another family member.   Allergies No Known Allergies   Home Medications  Prior to Admission medications   Medication  Sig Start Date End Date Taking? Authorizing Provider  acetaminophen (TYLENOL) 325 MG tablet Take 1-2 tablets (325-650 mg total) by mouth every 6 (six) hours as needed for fever, headache, mild pain or moderate pain. 01/14/16  Yes Nat Christen, PA-C  Ascorbic Acid (VITAMIN C) 1000 MG tablet Take 1,000 mg by mouth daily.    Yes [provider]  aspirin EC 81 MG tablet Take 81 mg by mouth daily.   Yes [provider]  Calcium Carbonate-Vitamin D (CALCIUM-D PO) Take 1 tablet by mouth daily.   Yes [provider]  clopidogrel (PLAVIX) 75 MG tablet TAKE 1 TABLET EVERY DAY Patient taking differently: Take 75 mg by mouth daily. 03/12/21  Yes Lorretta Harp, MD  fluconazole (DIFLUCAN) 100 MG tablet Take 1 tablet (100 mg total) by mouth daily for 10 days. 07/16/21 07/26/21 Yes Shah, Pratik D, DO  furosemide (LASIX) 20 MG tablet Take 20 mg by mouth daily. Take for 5 days.   Yes [provider]  lactulose (CHRONULAC) 10 GM/15ML solution Take 10 g by mouth 2 (two) times daily as needed for mild constipation.   Yes [provider]  metoprolol tartrate (LOPRESSOR) 25 MG tablet TAKE 1 TABLET TWICE DAILY (NEED MD APPOINTMENT) Patient taking differently: Take 25 mg by mouth 2 (two) times daily. 03/06/21  Yes Lorretta Harp, MD  Multiple Vitamin (MULTIVITAMIN WITH MINERALS) TABS tablet Take 1 tablet by mouth daily. Centrum Silver   Yes [provider]  Multiple Vitamins-Minerals (PRESERVISION AREDS 2) CAPS Take by mouth.   Yes [provider]  polyethylene glycol powder (GLYCOLAX/MIRALAX) powder Take 17 g by mouth daily as needed for mild constipation or moderate constipation.   Yes [provider]  simvastatin (ZOCOR) 20 MG tablet TAKE 1 TABLET AT BEDTIME Patient taking differently: Take 20 mg by mouth at bedtime. 09/12/20  Yes Lorretta Harp, MD     Critical care time:     CRITICAL CARE N/a  Lanier Clam, MD See Shea Evans for contact info

## 2021-07-20 NOTE — Progress Notes (Signed)
[facility] Progress Note Patient Name: LABRON KILIAN DOB: [birth year] MRN: [id number]   Date of Service  07/20/2021  HPI/Events of Note  Notified of Hgb 6.3 No report of bleeding recurrence since admission. Has received 3 units PRBC Remains in Afib RVR on amiodarone. BP 99/64  eICU Interventions  Transfuse 1 unit PRBC. Repeat CBC post transfusion Discussed with bedside RN     Intervention Category Intermediate Interventions: Other:  Judd Lien 07/20/2021, 10:26 PM

## 2021-07-20 NOTE — Progress Notes (Signed)
Date and time results received: 07/20/21 2100 (use smartphrase ".now" to insert current time)  Test: HGB Critical Value: 6.3  Name of Provider Notified: Elink provider  Actions Taken: Awaiting orders

## 2021-07-20 NOTE — Progress Notes (Addendum)
Progress Note   Subjective  Patient reportedly did not pass any further melena overnight, per nursing. HR low 100s to 130s, going back and forth between sinus and AF with RVR. Pressures 80 to low [id number] systolic. Got third unit of blood overnight. Hgb in 8s but has not increased.   Objective   Vital signs in last 24 hours: Temp:  [97.5 F (36.4 C)-98.6 F (37 C)] 97.5 F (36.4 C) (07/24 0404) Pulse Rate:  [31-145] 85 (07/24 0630) Resp:  [14-28] 16 (07/24 0630) BP: (81-133)/(37-104) 101/46 (07/24 0630) SpO2:  [88 %-99 %] 96 % (07/24 0630) Weight:  [110.6 kg-111.8 kg] 110.6 kg (07/24 0404) Last BM Date: 07/19/21 General:    white male in NAD Heart:  tachy Lungs: Respirations even and unlabored,  Abdomen:  Soft, nontender and nondistended.  Neurologic:  Alert and oriented,  grossly normal neurologically. Psych:  Cooperative. Normal mood and affect.  Intake/Output from previous day: 07/23 0701 - 07/24 0700 In: 3544.3 [I.V.:317.7; Blood:657.4; IV Piggyback:2569.2] Out: 1300 [Urine:1300] Intake/Output this shift: Total I/O In: 528.8 [I.V.:109.9; Blood:381.4; IV Piggyback:37.5] Out: 700 [Urine:700]  Lab Results: Recent Labs    07/19/21 0603 07/19/21 0643 07/19/21 1807 07/20/21 0017  WBC 14.7*  --   --  9.3  HGB 7.8* 8.2* 8.1* 8.3*  HCT 25.4* 24.0* 24.7* 23.7*  PLT 223  --   --  133*   BMET Recent Labs    07/19/21 0603 07/19/21 0643 07/19/21 1642 07/20/21 0017  NA 138 136 139 139  K 6.2* 6.0* 6.0*  6.0* 5.1  CL 108 108 109 107  CO2 19*  --  19* 21*  GLUCOSE 125* 120* 112* 105*  BUN 90* 92* 90* 92*  CREATININE 2.13* 2.10* 1.97* 1.98*  CALCIUM 8.8*  --  8.5* 8.5*   LFT Recent Labs    07/20/21 0017  PROT 4.5*  ALBUMIN 2.2*  AST 56*  ALT 74*  ALKPHOS 36*  BILITOT 0.7   PT/INR Recent Labs    07/19/21 0652  LABPROT 16.6*  INR 1.3*    Studies/Results: DG CHEST PORT 1 VIEW  Result Date: 07/19/2021 CLINICAL DATA:  Hemoptysis yesterday EXAM:  PORTABLE CHEST 1 VIEW COMPARISON:  07/11/2021 FINDINGS: Normal heart size and mediastinal contours. Low volume chest. No acute infiltrate or edema. No effusion or pneumothorax. No acute osseous findings. Artifact from EKG leads. IMPRESSION: Stable exam.  No acute finding. Electronically Signed   By: Monte Fantasia M.D.   On: 07/19/2021 09:03       Assessment / Plan:    87 y/o male on aspirin and plavix presenting with significant upper GI bleed. One episode of hematemesis and then multiple episodes of melena. Presented initially with Hgb in 7s, has received 3 units of blood yesterday and his Hgb has not appreciably increased but his melena has slowed overnight on IV PPI. His blood pressure has been soft but stable, HR has been low 100s to 130s AF with RVR, given amiodarone yesterday. I evaluated the patient multiple times yesterday and spoke with multiple family members. Offered him urgent endoscopy last night and he declined. BP and HR about the same this AM but concerned he did not respond to his last unit of RBC transfusion. Will check stat labs this AM and assess response. CCM has also been treating hyperkalemia which improved on last labs. I discussed role of endoscopy with the patient again today and while we will await labs, discussed if this is something  he would consider doing today. He understands he may need to be intubated for this. At this time he states he is not interested in endoscopy but if he's continuing to bleed, it will be difficult to get him through this without it. Assuming ongoing blood loss is driving his tachycardia. I will await his labs and talk with his family this morning about this further, although his mental status is very clear and he is declining endoscopy and family respects his wishes.    Will reassess him shortly and discuss with family when labs back, and see if he is willing to do endoscopy or not.   Jolly Mango, MD [facility]   ADDENDUM: Hgb up to 8.8 and holding from last night which is good. K stable. BUN slightly downtrending, so his bleeding has improved. I spoke with the patient again about an endoscopy today, and he is declining. I called his son Joniel and we discussed this for a bit. He remains at risk for recurrent severe bleeding without knowing what we are dealing with and doing an endoscopy. His HR remains quick in the low 100s, AF with RVR so higher risk for anesthesia in this setting as well. Discussed with critical care, will try to get HR improved with amiodarone today and see if that helps. Planning on checking Hgb this afternoon and trending. If the patient is stable from bleeding perspective on IV PPI can delay endoscopy until his HR is better controlled, that is the preference of the family. Unclear however if the patient will accept endoscopy at all, his mental status is sharp and he is quite clear about this. He does appear to be willing to have it done in an emergent setting of massive bleeding but does not wish to electively have this done otherwise. I asked Victorino to come in and speak with his father about this so they are on the same page, family wants endoscopy at some point in time. Otherwise continue to hold Plavix, will await his course today. If Hgb stable this afternoon can try clear liquids. We will monitor closely and determine timing of endoscopy if patient is willing at some point. If not today, perhaps next 1-2 days pending his course after discussion with the family. They agreed with the plan.  Jolly Mango, MD [facility]

## 2021-07-21 DIAGNOSIS — K920 Hematemesis: Secondary | ICD-10-CM | POA: Diagnosis not present

## 2021-07-21 LAB — BASIC METABOLIC PANEL
Anion gap: 8 (ref 5–15)
BUN: 98 mg/dL — ABNORMAL HIGH (ref 8–23)
CO2: 23 mmol/L (ref 22–32)
Calcium: 8 mg/dL — ABNORMAL LOW (ref 8.9–10.3)
Chloride: 105 mmol/L (ref 98–111)
Creatinine, Ser: 1.8 mg/dL — ABNORMAL HIGH (ref 0.61–1.24)
GFR, Estimated: 34 mL/min — ABNORMAL LOW (ref >60)
Glucose, Bld: 125 mg/dL — ABNORMAL HIGH (ref 70–99)
Potassium: 4.9 mmol/L (ref 3.5–5.1)
Sodium: 136 mmol/L (ref 135–145)

## 2021-07-21 LAB — CBC
HCT: 20.6 % — ABNORMAL LOW (ref 39.0–52.0)
Hemoglobin: 7 g/dL — ABNORMAL LOW (ref 13.0–17.0)
MCH: 31.8 pg (ref 26.0–34.0)
MCHC: 34 g/dL (ref 30.0–36.0)
MCV: 93.6 fL (ref 80.0–100.0)
Platelets: 124 10*3/uL — ABNORMAL LOW (ref 150–400)
RBC: 2.2 MIL/uL — ABNORMAL LOW (ref 4.22–5.81)
RDW: 16.9 % — ABNORMAL HIGH (ref 11.5–15.5)
WBC: 11.9 10*3/uL — ABNORMAL HIGH (ref 4.0–10.5)
nRBC: 1.9 % — ABNORMAL HIGH (ref 0.0–0.2)

## 2021-07-21 LAB — GLUCOSE, CAPILLARY
Glucose-Capillary: 125 mg/dL — ABNORMAL HIGH (ref 70–99)
Glucose-Capillary: 109 mg/dL — ABNORMAL HIGH (ref 70–99)
Glucose-Capillary: 103 mg/dL — ABNORMAL HIGH (ref 70–99)
Glucose-Capillary: 98 mg/dL (ref 70–99)
Glucose-Capillary: 122 mg/dL — ABNORMAL HIGH (ref 70–99)
Glucose-Capillary: 135 mg/dL — ABNORMAL HIGH (ref 70–99)
Glucose-Capillary: 92 mg/dL (ref 70–99)

## 2021-07-21 MED ORDER — LIDOCAINE HCL URETHRAL/MUCOSAL 2 % EX GEL
1.0000 "application " | Freq: Once | CUTANEOUS | Status: AC
  Administered 2021-07-21: 1 via URETHRAL
  Filled 2021-07-21: qty 6

## 2021-07-21 MED ORDER — PHENOL 1.4 % MT LIQD
1.0000 | OROMUCOSAL | Status: DC | PRN
  Administered 2021-07-27: 1 via OROMUCOSAL
  Filled 2021-07-21: qty 177

## 2021-07-21 NOTE — Plan of Care (Signed)

## 2021-07-21 NOTE — Progress Notes (Addendum)
        Daily Rounding Note  07/21/2021, 11:24 AM  LOS: 2 days   SUBJECTIVE:   Chief complaint:      Note heart rates in the 1 teens to 120s.  BPs as low as 80s/60s and consistently hypotensive. Denies BM's since day of admission.  No further N/V as well.   NPO.  Feeling overall better   OBJECTIVE:         Vital signs in last 24 hours:    Temp:  [97.7 F (36.5 C)-98.5 F (36.9 C)] 97.8 F (36.6 C) (07/25 0757) Pulse Rate:  [87-174] 118 (07/25 1100) Resp:  [15-26] 17 (07/25 1100) BP: (81-129)/(23-109) 89/65 (07/25 1100) SpO2:  [88 %-100 %] 99 % (07/25 1100) Weight:  [112.4 kg] 112.4 kg (07/25 0258) Last BM Date: 07/19/21 Filed Weights   07/19/21 2315 07/20/21 0404 07/21/21 0258  Weight: 111.8 kg 110.6 kg 112.4 kg   General: looks younger than 89.   Not ill looking   Heart: RRR, tachy in 120s.   Chest: no labored breathing or cough Abdomen: soft, NT, ND.  Active BS.  Firm, NT,  ridge beneath upper midline scar  Extremities: extensive purpura in LUE.   Neuro/Psych:  laconic, gruff.  Oriented x 3.  No tremor or gross deficits.    Intake/Output from previous day: 07/24 0701 - 07/25 0700 In: 831.9 [P.O.:240; I.V.:321.5; Blood:270.4] Out: 1750 [Urine:1750]  Intake/Output this shift: Total I/O In: 40 [I.V.:40] Out: 350 [Urine:350]  Lab Results: Recent Labs    07/20/21 1053 07/20/21 2011 07/21/21 0228  WBC 11.0* 14.5* 11.9*  HGB 8.3* 6.3* 7.0*  HCT 25.6* 19.0* 20.6*  PLT 140* 142* 124*   BMET Recent Labs    07/20/21 0017 07/20/21 0703 07/21/21 0228  NA 139 139 136  K 5.1 5.0 4.9  CL 107 109 105  CO2 21* 20* 23  GLUCOSE 105* 102* 125*  BUN 92* 88* 98*  CREATININE 1.98* 1.90* 1.80*  CALCIUM 8.5* 8.6* 8.0*   LFT Recent Labs    07/19/21 0603 07/20/21 0017  PROT 5.1* 4.5*  ALBUMIN 2.3* 2.2*  AST 62* 56*  ALT 82* 74*  ALKPHOS 43 36*  BILITOT 0.7 0.7   PT/INR Recent Labs    07/19/21 0652   LABPROT 16.6*  INR 1.3*   Hepatitis Panel No results for input(s): HEPBSAG, HCVAB, HEPAIGM, HEPBIGM in the last 72 hours.  Studies/Results: No results found.  Scheduled Meds:  sodium chloride   Intravenous Once   amiodarone  400 mg Oral TID   Chlorhexidine Gluconate Cloth  6 each Topical Daily   [START ON 07/22/2021] pantoprazole  40 mg Intravenous Q12H   sodium chloride flush  3 mL Intravenous Q12H   Continuous Infusions:  sodium chloride     pantoprazole 8 mg/hr (07/21/21 1100)   PRN Meds:.acetaminophen **OR** acetaminophen, albuterol, ondansetron **OR** ondansetron (ZOFRAN) IV, phenol  ASSESMENT:      Upper GI bleed with hematemesis, melena in setting of DAPT.  Pt declined EGD.  Protonix gtt. in place.     Blood loss anemia.  Hgb dipped to 7.8 >> 8.3 >> 6.3 yest >> 7 this AM.    S/p 4 PRBCs.      A. fib, RVR.  PO amiodarone in place.  PCI, DES stenting 2006. Chronic Plavix, last dose was 7/22.       Thrombocytopenia.     Coagulopathy, INR 1.3.     AKI, slightly improved.  GFR 34.    Rhabdomyolysis following fall at home/down for 6 days and hospitalization 7/15 -7/19 in [place].   Mild transaminitis.  Normal T bili.  Below normal alk phos.  Low albumin and prealbumin.  Remote cholecystectomy.   PLAN    Pt now willing to undergo EGD.  Procedure d/w pt.  Arranged for tmrw AM.  Full liquids now, NPO after mn.       Sarah Gribbin  07/21/2021, 11:24 AM Phone [phone]    Attending physician's note   I have taken an interval history, reviewed the chart and examined the patient. I agree with the Advanced Practitioner's note, impression and recommendations.   He has agreed to undergo EGD.   Scheduled EGD in a.m. I have discussed risks and benefits. Trend CBC, CMP Continue IV Protonix drip for now  Raj Nicodemus Denk, MD  GI [phone]  

## 2021-07-21 NOTE — H&P (View-Only) (Signed)
Daily Rounding Note  07/21/2021, 11:24 AM  LOS: 2 days   SUBJECTIVE:   Chief complaint:      Note heart rates in the 1 teens to 120s.  BPs as low as 80s/60s and consistently hypotensive. Denies BM's since day of admission.  No further N/V as well.   NPO.  Feeling overall better   OBJECTIVE:         Vital signs in last 24 hours:    Temp:  [97.7 F (36.5 C)-98.5 F (36.9 C)] 97.8 F (36.6 C) (07/25 0757) Pulse Rate:  [87-174] 118 (07/25 1100) Resp:  [15-26] 17 (07/25 1100) BP: (81-129)/(23-109) 89/65 (07/25 1100) SpO2:  [88 %-100 %] 99 % (07/25 1100) Weight:  [112.4 kg] 112.4 kg (07/25 0258) Last BM Date: 07/19/21 Filed Weights   07/19/21 2315 07/20/21 0404 07/21/21 0258  Weight: 111.8 kg 110.6 kg 112.4 kg   General: looks younger than 40.   Not ill looking   Heart: RRR, tachy in 120s.   Chest: no labored breathing or cough Abdomen: soft, NT, ND.  Active BS.  Firm, NT,  ridge beneath upper midline scar  Extremities: extensive purpura in LUE.   Neuro/Psych:  laconic, gruff.  Oriented x 3.  No tremor or gross deficits.    Intake/Output from previous day: 07/24 0701 - 07/25 0700 In: 831.9 [P.O.:240; I.V.:321.5; Blood:270.4] Out: 1750 [Urine:1750]  Intake/Output this shift: Total I/O In: 40 [I.V.:40] Out: 350 [Urine:350]  Lab Results: Recent Labs    07/20/21 1053 07/20/21 2011 07/21/21 0228  WBC 11.0* 14.5* 11.9*  HGB 8.3* 6.3* 7.0*  HCT 25.6* 19.0* 20.6*  PLT 140* 142* 124*   BMET Recent Labs    07/20/21 0017 07/20/21 0703 07/21/21 0228  NA 139 139 136  K 5.1 5.0 4.9  CL 107 109 105  CO2 21* 20* 23  GLUCOSE 105* 102* 125*  BUN 92* 88* 98*  CREATININE 1.98* 1.90* 1.80*  CALCIUM 8.5* 8.6* 8.0*   LFT Recent Labs    07/19/21 0603 07/20/21 0017  PROT 5.1* 4.5*  ALBUMIN 2.3* 2.2*  AST 62* 56*  ALT 82* 74*  ALKPHOS 43 36*  BILITOT 0.7 0.7   PT/INR Recent Labs    07/19/21 0652   LABPROT 16.6*  INR 1.3*   Hepatitis Panel No results for input(s): HEPBSAG, HCVAB, HEPAIGM, HEPBIGM in the last 72 hours.  Studies/Results: No results found.  Scheduled Meds:  sodium chloride   Intravenous Once   amiodarone  400 mg Oral TID   Chlorhexidine Gluconate Cloth  6 each Topical Daily   [START ON 07/22/2021] pantoprazole  40 mg Intravenous Q12H   sodium chloride flush  3 mL Intravenous Q12H   Continuous Infusions:  sodium chloride     pantoprazole 8 mg/hr (07/21/21 1100)   PRN Meds:.acetaminophen **OR** acetaminophen, albuterol, ondansetron **OR** ondansetron (ZOFRAN) IV, phenol  ASSESMENT:      Upper GI bleed with hematemesis, melena in setting of DAPT.  Pt declined EGD.  Protonix gtt. in place.     Blood loss anemia.  Hgb dipped to 7.8 >> 8.3 >> 6.3 yest >> 7 this AM.    S/p 4 PRBCs.      A. fib, RVR.  PO amiodarone in place.  PCI, DES stenting 2006. Chronic Plavix, last dose was 7/22.       Thrombocytopenia.     Coagulopathy, INR 1.3.     AKI, slightly improved.  GFR 34.  Rhabdomyolysis following fall at home/down for 6 days and hospitalization 7/15 -7/19 in [place].   Mild transaminitis.  Normal T bili.  Below normal alk phos.  Low albumin and prealbumin.  Remote cholecystectomy.   PLAN    Pt now willing to undergo EGD.  Procedure d/w pt.  Arranged for tmrw AM.  Full liquids now, NPO after mn.       Caleb [name]  07/21/2021, 11:24 AM Phone [phone]    Attending physician's note   I have taken an interval history, reviewed the chart and examined the patient. I agree with the Advanced Practitioner's note, impression and recommendations.   He has agreed to undergo EGD.   Scheduled EGD in a.m. I have discussed risks and benefits. Trend CBC, CMP Continue IV Protonix drip for now  Carmell Austria, MD [facility] [phone]

## 2021-07-21 NOTE — Progress Notes (Signed)
NAME:  Caleb [name], MRN:  [id number], DOB:  [birth year], LOS: 2 ADMISSION DATE:  07/19/2021, CONSULTATION DATE:  07/21/21 REFERRING MD:  GI, CHIEF COMPLAINT:  blood in mouth   History of Present Illness:  87 year old man on Plavix for history of CAD admitted after seeing blood in mouth, melenic stools present.  Patient resides in a nursing facility.  Admitted to the ICU at the request of GI in case of decompensation and emergent endoscopy.  Patient is difficult to obtain history from. Not forthcoming with answers.  Lives in nursing facility.  Complained of nausea and vomiting.  Noticed some bloody emesis or secretions in the mouth.  Maybe some hemoptysis not clear.  Continued nausea and vomiting.  Dark stools present.  EMS was called.  Heart rates tach to the 130s.  Atrial fibrillation with RVR.  Upon arrival to the ED, tachycardia, soft blood pressures.  Was received 2 L of crystalloid.  In addition 1 unit PRBC administered.  Second unit ordered.  GI consulted.  Recommend ICU admission.  Notably, hemoglobin down a couple points from recent discharge.  Continues to have melenic stools in the ED. Pertinent  Medical History  Afib, CAD PCI RCA 2006, [place] [facility] Events: Including procedures, antibiotic start and stop dates in addition to other pertinent events   7/23 admitted with GI bleed 7/24 Hgb stable after 3u PRBC, IV PPI 7/25 1u PRBC  Interim History / Subjective:  Transfused one additional PRBC overnight. Hypotensive this am however mental status appropriate. No further bleeding  Objective   Blood pressure (!) 89/65, pulse (!) 118, temperature 97.8 F (36.6 C), temperature source Oral, resp. rate 17, weight 112.4 kg, SpO2 99 %.        Intake/Output Summary (Last 24 hours) at 07/21/2021 1133 Last data filed at 07/21/2021 1100 Gross per 24 hour  Intake 871.88 ml  Output 2100 ml  Net -1228.12 ml   Filed Weights   07/19/21 2315 07/20/21 0404 07/21/21 0258  Weight:  111.8 kg 110.6 kg 112.4 kg   Physical Exam: General: Elderly-appearing HENT: [place], AT, OP clear, MMM Eyes: EOMI, no scleral icterus Respiratory: Clear to auscultation bilaterally.  No crackles, wheezing or rales Cardiovascular: Irregularly irregular, -M/R/G, no JVD GI: BS+, soft, nontender Extremities:-Edema,-tenderness Neuro: AAO x4, CNII-XII grossly intact Psych: Emotionally guarded  Resolved Hospital Problem list     Assessment & Plan:  GI bleed: Suspect upper source. Hematemesis, melena in setting of DAPT. Intermittent hypotension. Transfused additional unit PRBC overnight --S/p PRBC x 4 total --Pantoprazole infusion --Maintain 2 large-bore IVs --GI consult, appreciate assistance --Patient agrees to EGD. NPO tonight for procedure tomorrow  Atrial fibrillation: ST and RVR intermittently. --PO amio  CAD: Status post PCI to RCA in 2006. --Hold Plavix in setting of GI bleed, unclear why patient is on Plavix this long-term, consider low-dose aspirin in the future given history of PCI and CAD for secondary prevention --hold lasix, metoprolol  Best Practice (right click and "Reselect all SmartList Selections" daily)   Diet/type: full liquids . NPO tonight DVT prophylaxis: SCD GI prophylaxis: PPI Lines: N/A Foley:  N/A Code Status:  full code Last date of multidisciplinary goals of care discussion [n/a]  Labs   CBC: Recent Labs  Lab 07/19/21 0603 07/19/21 0643 07/20/21 0017 07/20/21 0703 07/20/21 1053 07/20/21 2011 07/21/21 0228  WBC 14.7*  --  9.3 11.7* 11.0* 14.5* 11.9*  NEUTROABS 12.5*  --  6.8 9.0*  --   --   --  HGB 7.8*   < > 8.3* 8.8* 8.3* 6.3* 7.0*  HCT 25.4*   < > 23.7* 25.3* 25.6* 19.0* 20.6*  MCV 103.7*  --  90.1 92.3 98.5 97.9 93.6  PLT 223  --  133* 159 140* 142* 124*   < > = values in this interval not displayed.    Basic Metabolic Panel: Recent Labs  Lab 07/15/21 0441 07/19/21 0603 07/19/21 0643 07/19/21 1642 07/20/21 0017 07/20/21 0703  07/21/21 0228  NA 135 138 136 139 139 139 136  K 4.5 6.2* 6.0* 6.0*  6.0* 5.1 5.0 4.9  CL 108 108 108 109 107 109 105  CO2 22 19*  --  19* 21* 20* 23  GLUCOSE 100* 125* 120* 112* 105* 102* 125*  BUN 32* 90* 92* 90* 92* 88* 98*  CREATININE 1.22 2.13* 2.10* 1.97* 1.98* 1.90* 1.80*  CALCIUM 8.0* 8.8*  --  8.5* 8.5* 8.6* 8.0*  MG 1.9  --   --   --   --   --   --    GFR: Estimated Creatinine Clearance: 31.5 mL/min (A) (by C-G formula based on SCr of 1.8 mg/dL (H)). Recent Labs  Lab 07/20/21 0703 07/20/21 1053 07/20/21 2011 07/21/21 0228  WBC 11.7* 11.0* 14.5* 11.9*    Liver Function Tests: Recent Labs  Lab 07/15/21 0441 07/19/21 0603 07/20/21 0017  AST 45* 62* 56*  ALT 55* 82* 74*  ALKPHOS 38 43 36*  BILITOT 0.5 0.7 0.7  PROT 4.9* 5.1* 4.5*  ALBUMIN 2.3* 2.3* 2.2*   No results for input(s): LIPASE, AMYLASE in the last 168 hours. No results for input(s): AMMONIA in the last 168 hours.  ABG    Component Value Date/Time   TCO2 21 (L) 07/19/2021 0643     Coagulation Profile: Recent Labs  Lab 07/19/21 0652  INR 1.3*    Cardiac Enzymes: Recent Labs  Lab 07/15/21 0441  CKTOTAL 91    HbA1C: No results found for: HGBA1C  CBG: Recent Labs  Lab 07/20/21 1943 07/20/21 2345 07/21/21 0320 07/21/21 0745 07/21/21 1114  GLUCAP 99 132* 109* 103* 98    Critical care time: 31 min    The patient is critically ill with multiple organ systems failure and requires high complexity decision making for assessment and support, frequent evaluation and titration of therapies, application of advanced monitoring technologies and extensive interpretation of multiple databases.    Rodman Pickle, M.D. [facility] Pulmonary/Critical Care Medicine 07/21/2021 11:33 AM   Please see Amion for pager number to reach on-call Pulmonary and Critical Care Team.

## 2021-07-21 NOTE — Progress Notes (Signed)
   07/21/21 1039  Clinical Encounter Type  Visited With Patient  Visit Type Spiritual support  Referral From Patient;Nurse  Consult/Referral To Chaplain  Spiritual Encounters  Spiritual Needs Prayer  Stress Factors  Family Stress Factors Health changes  Chaplain received a page from [place] that [name] requested prayer and he is [employer].  [name] talked about the procedure he must have and was concerned and wanted prayer specifically for his life.   We prayed and at the end [name] stated I did good.    Chaplain Genna Casimir Morgan-Simpson  [phone]

## 2021-07-22 ENCOUNTER — Encounter (HOSPITAL_COMMUNITY)
Admission: EM | Disposition: A | Discharge status: Skilled Nursing Facility | Payer: Self-pay | Source: Skilled Nursing Facility | Attending: Internal Medicine

## 2021-07-22 ENCOUNTER — Encounter (HOSPITAL_COMMUNITY): Payer: Self-pay | Admitting: Pulmonary Disease

## 2021-07-22 ENCOUNTER — Inpatient Hospital Stay (HOSPITAL_COMMUNITY): Payer: Medicare Other | Admitting: Anesthesiology

## 2021-07-22 DIAGNOSIS — K920 Hematemesis: Secondary | ICD-10-CM | POA: Diagnosis not present

## 2021-07-22 DIAGNOSIS — K269 Duodenal ulcer, unspecified as acute or chronic, without hemorrhage or perforation: Secondary | ICD-10-CM

## 2021-07-22 DIAGNOSIS — D62 Acute posthemorrhagic anemia: Secondary | ICD-10-CM | POA: Diagnosis not present

## 2021-07-22 HISTORY — PX: ESOPHAGOGASTRODUODENOSCOPY (EGD) WITH PROPOFOL: SHX5813

## 2021-07-22 HISTORY — PX: BIOPSY: SHX5522

## 2021-07-22 LAB — CBC
HCT: 18.7 % — ABNORMAL LOW (ref 39.0–52.0)
HCT: 23.1 % — ABNORMAL LOW (ref 39.0–52.0)
HCT: 24.3 % — ABNORMAL LOW (ref 39.0–52.0)
Hemoglobin: 6 g/dL — CL (ref 13.0–17.0)
Hemoglobin: 7.8 g/dL — ABNORMAL LOW (ref 13.0–17.0)
Hemoglobin: 8.1 g/dL — ABNORMAL LOW (ref 13.0–17.0)
MCH: 31.1 pg (ref 26.0–34.0)
MCH: 31.3 pg (ref 26.0–34.0)
MCH: 30.8 pg (ref 26.0–34.0)
MCHC: 32.1 g/dL (ref 30.0–36.0)
MCHC: 33.8 g/dL (ref 30.0–36.0)
MCHC: 33.3 g/dL (ref 30.0–36.0)
MCV: 96.9 fL (ref 80.0–100.0)
MCV: 92.8 fL (ref 80.0–100.0)
MCV: 92.4 fL (ref 80.0–100.0)
Platelets: 126 10*3/uL — ABNORMAL LOW (ref 150–400)
Platelets: 122 10*3/uL — ABNORMAL LOW (ref 150–400)
Platelets: 133 10*3/uL — ABNORMAL LOW (ref 150–400)
RBC: 1.93 MIL/uL — ABNORMAL LOW (ref 4.22–5.81)
RBC: 2.49 MIL/uL — ABNORMAL LOW (ref 4.22–5.81)
RBC: 2.63 MIL/uL — ABNORMAL LOW (ref 4.22–5.81)
RDW: 16.8 % — ABNORMAL HIGH (ref 11.5–15.5)
RDW: 18.4 % — ABNORMAL HIGH (ref 11.5–15.5)
RDW: 18.4 % — ABNORMAL HIGH (ref 11.5–15.5)
WBC: 13.4 10*3/uL — ABNORMAL HIGH (ref 4.0–10.5)
WBC: 10.9 10*3/uL — ABNORMAL HIGH (ref 4.0–10.5)
WBC: 10.4 10*3/uL (ref 4.0–10.5)
nRBC: 1.3 % — ABNORMAL HIGH (ref 0.0–0.2)
nRBC: 1 % — ABNORMAL HIGH (ref 0.0–0.2)
nRBC: 1.5 % — ABNORMAL HIGH (ref 0.0–0.2)

## 2021-07-22 LAB — GLUCOSE, CAPILLARY
Glucose-Capillary: 104 mg/dL — ABNORMAL HIGH (ref 70–99)
Glucose-Capillary: 105 mg/dL — ABNORMAL HIGH (ref 70–99)
Glucose-Capillary: 116 mg/dL — ABNORMAL HIGH (ref 70–99)
Glucose-Capillary: 117 mg/dL — ABNORMAL HIGH (ref 70–99)
Glucose-Capillary: 121 mg/dL — ABNORMAL HIGH (ref 70–99)

## 2021-07-22 LAB — BASIC METABOLIC PANEL
Anion gap: 7 (ref 5–15)
BUN: 73 mg/dL — ABNORMAL HIGH (ref 8–23)
CO2: 23 mmol/L (ref 22–32)
Calcium: 8 mg/dL — ABNORMAL LOW (ref 8.9–10.3)
Chloride: 106 mmol/L (ref 98–111)
Creatinine, Ser: 1.66 mg/dL — ABNORMAL HIGH (ref 0.61–1.24)
GFR, Estimated: 38 mL/min — ABNORMAL LOW (ref >60)
Glucose, Bld: 106 mg/dL — ABNORMAL HIGH (ref 70–99)
Potassium: 4.5 mmol/L (ref 3.5–5.1)
Sodium: 136 mmol/L (ref 135–145)

## 2021-07-22 LAB — MAGNESIUM: Magnesium: 2.3 mg/dL (ref 1.7–2.4)

## 2021-07-22 LAB — PREPARE RBC (CROSSMATCH)

## 2021-07-22 SURGERY — ESOPHAGOGASTRODUODENOSCOPY (EGD) WITH PROPOFOL
Anesthesia: Monitor Anesthesia Care

## 2021-07-22 MED ORDER — PHENYLEPHRINE HCL-NACL 10-0.9 MG/250ML-% IV SOLN
INTRAVENOUS | Status: DC | PRN
  Administered 2021-07-22: 25 ug/min via INTRAVENOUS

## 2021-07-22 MED ORDER — LIDOCAINE 2% (20 MG/ML) 5 ML SYRINGE
INTRAMUSCULAR | Status: DC | PRN
  Administered 2021-07-22: 80 mg via INTRAVENOUS

## 2021-07-22 MED ORDER — PROPOFOL 500 MG/50ML IV EMUL
INTRAVENOUS | Status: DC | PRN
  Administered 2021-07-22: 100 ug/kg/min via INTRAVENOUS

## 2021-07-22 MED ORDER — SODIUM CHLORIDE 0.9% IV SOLUTION: Freq: Once | INTRAVENOUS | Status: DC

## 2021-07-22 MED ORDER — ALBUMIN HUMAN 5 % IV SOLN: INTRAVENOUS | Status: DC | PRN

## 2021-07-22 MED ORDER — LACTATED RINGERS IV SOLN: INTRAVENOUS | Status: DC | PRN

## 2021-07-22 MED ORDER — SODIUM CHLORIDE 0.9% IV SOLUTION: Freq: Once | INTRAVENOUS | Status: AC

## 2021-07-22 SURGICAL SUPPLY — 15 items

## 2021-07-22 NOTE — Op Note (Addendum)
[facility] Patient Name: Caleb [name] Procedure Date : 07/22/2021 MRN: [id number] Attending MD: Jackquline Denmark , MD Date of Birth: Jan 11, 1925 CSN: [id number] Age: 87 Admit Type: Inpatient Procedure:                Upper GI endoscopy Indications:              H/O Hematemesis on ASA/plavix s/p 5U PRBC. Providers:                Jackquline Denmark, MD, Glori Bickers, RN, Tyrone Apple, Technician, Virgilio Belling. Beckner, CRNA Referring MD:              Medicines:                Monitored Anesthesia Care Complications:            No immediate complications. Estimated Blood Loss:     Estimated blood loss: none. Procedure:                Pre-Anesthesia Assessment:                           - Prior to the procedure, a History and Physical                            was performed, and patient medications and                            allergies were reviewed. The patient's tolerance of                            previous anesthesia was also reviewed. The risks                            and benefits of the procedure and the sedation                            options and risks were discussed with the patient.                            All questions were answered, and informed consent                            was obtained. Prior Anticoagulants: The patient has                            taken Plavix (clopidogrel), last dose was 5 days                            prior to procedure. ASA Grade Assessment: IV - A                            patient with severe systemic disease that is a  constant threat to life. After reviewing the risks                            and benefits, the patient was deemed in                            satisfactory condition to undergo the procedure.                           After obtaining informed consent, the endoscope was                            passed under direct vision. Throughout the                             procedure, the patient's blood pressure, pulse, and                            oxygen saturations were monitored continuously. The                            [id number]) Olympus endoscope was introduced                            through the mouth, and advanced to the second part                            of duodenum. The upper GI endoscopy was                            accomplished without difficulty. The patient                            tolerated the procedure well. Scope In: Scope Out: Findings:      The examined esophagus was normal with well-defined Z-line at 40 cm.      The entire examined stomach was normal. Few Biopsies were taken with a       cold forceps for histology to r/o HP.      Five non-bleeding cratered and linear duodenal ulcers were found in the       duodenal bulb and in the first portion of the duodenum. The largest       lesion was 20 mm in largest dimension with a flat pigmented spot. No       active bleeding. Endoscopically low risk for recurrent bleeding. Hence,       not treated endoscopically. The second portion of the duodenum was       normal. Impression:               -Significant duodenal ulcers with pigmented                            material. No active bleeding. Endoscopically low                            risk for recurrent  bleeding Recommendation:           - Resume previous diet.                           - Once able to tolerate p.o., switch to Protonix                            (pantoprazole) 40 mg PO BID for 8 weeks, then QD                            indefinitely.                           - No ibuprofen, naproxen, or other non-steroidal                            anti-inflammatory drugs.                           - AC- ?Need for dual therapy per notes. If                            monotherapy (ECASA or plavix) is needed, can start                            in 72 hrs.                           - The findings and  recommendations were discussed                            with the patient's family (son)                           - Follow biopsies for H. pylori.                           - FU GI clinic in 6-8 weeks.                           - Will sign off for now. Procedure Code(s):        --- Professional ---                           [phone], Esophagogastroduodenoscopy, flexible,                            transoral; with biopsy, single or multiple Diagnosis Code(s):        --- Professional ---                           K26.9, Duodenal ulcer, unspecified as acute or                            chronic, without hemorrhage or perforation  K92.0, Hematemesis CPT copyright 2019 [employer]. All rights reserved. The codes documented in this report are preliminary and upon coder review may  be revised to meet current compliance requirements. Jackquline Denmark, MD 07/22/2021 11:24:17 AM This report has been signed electronically. Number of Addenda: 0

## 2021-07-22 NOTE — Transfer of Care (Signed)
Immediate Anesthesia Transfer of Care Note  Patient: Caleb [name]  Procedure(s) Performed: ESOPHAGOGASTRODUODENOSCOPY (EGD) WITH PROPOFOL BIOPSY  Patient Location: Endoscopy Unit  Anesthesia Type:MAC  Level of Consciousness: awake, alert  and oriented  Airway & Oxygen Therapy: Patient Spontanous Breathing and Patient connected to nasal cannula oxygen  Post-op Assessment: Report given to RN and Post -op Vital signs reviewed and stable  Post vital signs: Reviewed and stable  Last Vitals:  Vitals Value Taken Time  BP 114/45 07/22/21 1119  Temp 36.7 C 07/22/21 1119  Pulse 87 07/22/21 1119  Resp 18 07/22/21 1119  SpO2 100 % 07/22/21 1119  Vitals shown include unvalidated device data.  Last Pain:  Vitals:   07/22/21 1119  TempSrc: Oral  PainSc: 0-No pain         Complications: No notable events documented.

## 2021-07-22 NOTE — Plan of Care (Signed)

## 2021-07-22 NOTE — Progress Notes (Signed)
eLink Physician-Brief Progress Note Patient Name: DOREN APOSTLE DOB: May 24, 1925 MRN: [id number]   Date of Service  07/22/2021  HPI/Events of Note  Hgb 6.0. Patient is going for EGD today.  eICU Interventions  Ordered 1u pRBCs and a repeat CBC at 1500 hrs today.     Intervention Category Intermediate Interventions: Bleeding - evaluation and treatment with blood products  Marily Lente Ayden Hardwick 07/22/2021, 5:11 AM

## 2021-07-22 NOTE — Anesthesia Preprocedure Evaluation (Addendum)
Anesthesia Evaluation  Patient identified by MRN, date of birth, ID band Patient awake    Reviewed: Allergy & Precautions, NPO status , Patient's Chart, lab work & pertinent test results  Airway Mallampati: III  TM Distance: >3 FB Neck ROM: Full    Dental  (+) Dental Advisory Given, Poor Dentition, Chipped,    Pulmonary former smoker,  Quit smoking 1984   Pulmonary exam normal breath sounds clear to auscultation       Cardiovascular + CAD, + CABG and + Peripheral Vascular Disease (plavix)  Normal cardiovascular exam+ Valvular Problems/Murmurs (mild-mod AS) AS  Rhythm:Regular Rate:Normal  Echo 07/12/21: 1. Left ventricular ejection fraction, by estimation, is 65 to 70%. The  left ventricle has hyperdynamic function. The left ventricle has no  regional wall motion abnormalities. There is mild concentric left  ventricular hypertrophy. Left ventricular  diastolic function could not be evaluated.  2. Right ventricular systolic function is normal. The right ventricular  size is normal. There is normal pulmonary artery systolic pressure.  3. Left atrial size was moderately dilated.  4. Right atrial size was mildly dilated.  5. The mitral valve is normal in structure. No evidence of mitral valve  regurgitation.  6. The aortic valve is tricuspid. There is moderate calcification of the  aortic valve. There is moderate thickening of the aortic valve. Aortic  valve regurgitation is not visualized. Mild to moderate aortic valve  stenosis.  7. Aortic dilatation noted. There is mild dilatation of the aortic root,  measuring 40 mm. There is mild dilatation of the ascending aorta,  measuring 39 mm.  8. The inferior vena cava is normal in size with greater than 50%  respiratory variability, suggesting right atrial pressure of 3 mmHg.    Neuro/Psych negative psych ROS   GI/Hepatic negative GI ROS, (+)     Substance abuse: Cr 1.66.   ,   Endo/Other  Obesity BMI 35  Renal/GU Renal InsufficiencyRenal disease  negative genitourinary   Musculoskeletal negative musculoskeletal ROS (+)   Abdominal (+) + obese,   Peds  Hematology  (+) Blood dyscrasia, anemia , Hb 6.0 this AM- s/p 1 unit prbc, will give 1 more prior to anesthesia   Anesthesia Other Findings Admitted w/ Afib RVR, Bps in 80s/50s- admitted to ICU  Reproductive/Obstetrics negative OB ROS                            Anesthesia Physical Anesthesia Plan  ASA: 4  Anesthesia Plan: MAC   Post-op Pain Management:    Induction:   PONV Risk Score and Plan: 2 and Propofol infusion and TIVA  Airway Management Planned: Natural Airway and Simple Face Mask  Additional Equipment: None  Intra-op Plan:   Post-operative Plan:   Informed Consent: I have reviewed the patients History and Physical, chart, labs and discussed the procedure including the risks, benefits and alternatives for the proposed anesthesia with the patient or authorized representative who has indicated his/her understanding and acceptance.       Plan Discussed with: CRNA  Anesthesia Plan Comments: (From floor w/ nonfunctional IV- ultrasound guided PIV placed in order to give one more unit of blood  Hypotensive at baseline 90-100SBP, will need pressors to tolerate sedation, hopefully temporarily )       Anesthesia Quick Evaluation

## 2021-07-22 NOTE — Progress Notes (Signed)
NAME:  Caleb [name], MRN:  [id number], DOB:  November 20, 1925, LOS: 3 ADMISSION DATE:  07/19/2021, CONSULTATION DATE:  07/22/21 REFERRING MD:  GI, CHIEF COMPLAINT:  blood in mouth   History of Present Illness:  87 year old man on Plavix for history of CAD admitted after seeing blood in mouth, melenic stools present.  Patient resides in a nursing facility.  Admitted to the ICU at the request of GI in case of decompensation and emergent endoscopy.  Patient is difficult to obtain history from. Not forthcoming with answers.  Lives in nursing facility.  Complained of nausea and vomiting.  Noticed some bloody emesis or secretions in the mouth.  Maybe some hemoptysis not clear.  Continued nausea and vomiting.  Dark stools present.  EMS was called.  Heart rates tach to the 130s.  Atrial fibrillation with RVR.  Upon arrival to the ED, tachycardia, soft blood pressures.  Was received 2 L of crystalloid.  In addition 1 unit PRBC administered.  Second unit ordered.  GI consulted.  Recommend ICU admission.  Notably, hemoglobin down a couple points from recent discharge.  Continues to have melenic stools in the ED. Pertinent  Medical History  Afib, CAD PCI RCA 2006, [place] Hospital Events: Including procedures, antibiotic start and stop dates in addition to other pertinent events   7/23 admitted with GI bleed 7/24 Hgb stable after 3u PRBC, IV PPI 7/25 1u PRBC. Intermittently hypotensive 7/26 HDS overnight however Hg dropped to 6.  Interim History / Subjective:  Transfused 1 U of PRBC this morning for Hg 6 and GI requested additional unit prior to procedure. No complaints this morning  Objective   Blood pressure 102/65, pulse (!) 109, temperature 98.2 F (36.8 C), temperature source Oral, resp. rate 17, weight 112 kg, SpO2 95 %.        Intake/Output Summary (Last 24 hours) at 07/22/2021 0932 Last data filed at 07/22/2021 0825 Gross per 24 hour  Intake 1254.55 ml  Output 2240 ml  Net -985.45 ml    Filed Weights   07/20/21 0404 07/21/21 0258 07/22/21 0500  Weight: 110.6 kg 112.4 kg 112 kg   Physical Exam: General: Elderly-appearing, no acute distress HENT: [place], AT, OP clear, MMM Eyes: EOMI, no scleral icterus Respiratory: Clear to auscultation bilaterally.  No crackles, wheezing or rales Cardiovascular: RRR, -M/R/G, no JVD GI: BS+, soft, nontender Extremities:-Edema,-tenderness Neuro: AAO x4, CNII-XII grossly intact Psych: Emotionally guarded   Resolved Hospital Problem list     Assessment & Plan:  GI bleed: Suspect upper source. Hematemesis, melena in setting of DAPT. Intermittent hypotension. Transfused additional unit PRBC overnight --S/p PRBC x 5 total. Give additional unit pre-procedure --Pantoprazole infusion --Maintain 2 large-bore IVs --GI consult, appreciate assistance --NPO for EGD  Hypotension - resolved --Management as above  Paroxysmal Atrial fibrillation: ST and RVR intermittently. --PO amio --Hold anticoagulation  AKI - improving --Monitor UOP/Cr --Maintain perfusion MAP >65  CAD: Status post PCI to RCA in 2006. --Hold Plavix in setting of GI bleed, unclear why patient is on Plavix this long-term, consider low-dose aspirin in the future given history of PCI and CAD for secondary prevention --hold lasix, metoprolol  Best Practice (right click and "Reselect all SmartList Selections" daily)   Diet/type: full liquids . NPO  DVT prophylaxis: SCD GI prophylaxis: PPI Lines: N/A Foley:  N/A Code Status:  full code Last date of multidisciplinary goals of care discussion [n/a]  Labs   CBC: Recent Labs  Lab 07/19/21 0603 07/19/21 [id number] 07/20/21  0017 07/20/21 0703 07/20/21 1053 07/20/21 2011 07/21/21 0228 07/22/21 0400  WBC 14.7*  --  9.3 11.7* 11.0* 14.5* 11.9* 13.4*  NEUTROABS 12.5*  --  6.8 9.0*  --   --   --   --   HGB 7.8*   < > 8.3* 8.8* 8.3* 6.3* 7.0* 6.0*  HCT 25.4*   < > 23.7* 25.3* 25.6* 19.0* 20.6* 18.7*  MCV 103.7*  --  90.1  92.3 98.5 97.9 93.6 96.9  PLT 223  --  133* 159 140* 142* 124* 126*   < > = values in this interval not displayed.    Basic Metabolic Panel: Recent Labs  Lab 07/19/21 1642 07/20/21 0017 07/20/21 0703 07/21/21 0228 07/22/21 0400  NA 139 139 139 136 136  K 6.0*  6.0* 5.1 5.0 4.9 4.5  CL 109 107 109 105 106  CO2 19* 21* 20* 23 23  GLUCOSE 112* 105* 102* 125* 106*  BUN 90* 92* 88* 98* 73*  CREATININE 1.97* 1.98* 1.90* 1.80* 1.66*  CALCIUM 8.5* 8.5* 8.6* 8.0* 8.0*  MG  --   --   --   --  2.3   GFR: Estimated Creatinine Clearance: 34.1 mL/min (A) (by C-G formula based on SCr of 1.66 mg/dL (H)). Recent Labs  Lab 07/20/21 1053 07/20/21 2011 07/21/21 0228 07/22/21 0400  WBC 11.0* 14.5* 11.9* 13.4*    Liver Function Tests: Recent Labs  Lab 07/19/21 0603 07/20/21 0017  AST 62* 56*  ALT 82* 74*  ALKPHOS 43 36*  BILITOT 0.7 0.7  PROT 5.1* 4.5*  ALBUMIN 2.3* 2.2*   No results for input(s): LIPASE, AMYLASE in the last 168 hours. No results for input(s): AMMONIA in the last 168 hours.  ABG    Component Value Date/Time   TCO2 21 (L) 07/19/2021 0643     Coagulation Profile: Recent Labs  Lab 07/19/21 0652  INR 1.3*    Cardiac Enzymes: No results for input(s): CKTOTAL, CKMB, CKMBINDEX, TROPONINI in the last 168 hours.   HbA1C: No results found for: HGBA1C  CBG: Recent Labs  Lab 07/21/21 1533 07/21/21 1936 07/21/21 2321 07/22/21 0332 07/22/21 0726  GLUCAP 122* 135* 92 104* 105*    Care time: 35 min    Transfer to [facility] for pick-up tomorrow  Rodman Pickle, M.D. [facility] Pulmonary/Critical Care Medicine 07/22/2021 9:33 AM   See Amion for personal pager For hours between 7 PM to 7 AM, please call Elink for urgent questions

## 2021-07-22 NOTE — Progress Notes (Signed)
OT Cancellation Note  Patient Details Name: Caleb [name] MRN: [id number] DOB: 06-27-1925   Cancelled Treatment:    Reason Eval/Treat Not Completed: 1) Medical issues which prohibited therapy; patient with Hgb 6.0. Per therapy protocol OT to hold this date. 2)Patient at procedure or test/ unavailable; patient of floor for EGD. OT to re-attempt evaluation 7/27.   Gloris Manchester OTR/L Supplemental OT, Department of rehab services [phone]  Zeriyah Wain R H. 07/22/2021, 9:54 AM

## 2021-07-22 NOTE — Anesthesia Postprocedure Evaluation (Signed)
Anesthesia Post Note  Patient: Caleb [name]  Procedure(s) Performed: ESOPHAGOGASTRODUODENOSCOPY (EGD) WITH PROPOFOL BIOPSY     Patient location during evaluation: PACU Anesthesia Type: MAC Level of consciousness: awake and alert Pain management: pain level controlled Vital Signs Assessment: post-procedure vital signs reviewed and stable Respiratory status: spontaneous breathing, nonlabored ventilation and respiratory function stable Cardiovascular status: stable Postop Assessment: no apparent nausea or vomiting Anesthetic complications: no Comments: Hypotensive at baseline (80-90s SBP), but SBP in 70s postoperatively- recovered slightly with albumin, pt asymptomatic. Will send back to ICU with low dose phenylephrine drip infusing.    No notable events documented.  Last Vitals:  Vitals:   07/22/21 1150 07/22/21 1155  BP: 96/67 100/64  Pulse: 81 69  Resp: 14 14  Temp:    SpO2: 100% 100%    Last Pain:  Vitals:   07/22/21 1155  TempSrc:   PainSc: 0-No pain                 Pervis Hocking

## 2021-07-22 NOTE — Interval H&P Note (Signed)
History and Physical Interval Note:  07/22/2021 10:05 AM  Caleb [name]  has presented today for surgery, with the diagnosis of upper GI bleed.  The various methods of treatment have been discussed with the patient and family. After consideration of risks, benefits and other options for treatment, the patient has consented to  Procedure(s): ESOPHAGOGASTRODUODENOSCOPY (EGD) WITH PROPOFOL (N/A) as a surgical intervention.  The patient's history has been reviewed, patient examined, no change in status, stable for surgery.  I have reviewed the patient's chart and labs.  Questions were answered to the patient's satisfaction.     Jackquline Denmark

## 2021-07-23 DIAGNOSIS — D62 Acute posthemorrhagic anemia: Secondary | ICD-10-CM | POA: Diagnosis not present

## 2021-07-23 DIAGNOSIS — N179 Acute kidney failure, unspecified: Secondary | ICD-10-CM | POA: Diagnosis not present

## 2021-07-23 DIAGNOSIS — K922 Gastrointestinal hemorrhage, unspecified: Secondary | ICD-10-CM

## 2021-07-23 LAB — BPAM RBC
Blood Product Expiration Date: 202207282359
Blood Product Expiration Date: 202208012359
Blood Product Expiration Date: 202208032359
Blood Product Expiration Date: 202208062359
Blood Product Expiration Date: 202208082359
Blood Product Expiration Date: 202208122359
Blood Product Expiration Date: 202208252359
ISSUE DATE / TIME: 202207230907
ISSUE DATE / TIME: 202207231041
ISSUE DATE / TIME: 202207231214
ISSUE DATE / TIME: 202207232021
ISSUE DATE / TIME: 202207242243
ISSUE DATE / TIME: 202207260537
ISSUE DATE / TIME: 202207260927
Unit Type and Rh: 5100
Unit Type and Rh: 9500
Unit Type and Rh: 9500
Unit Type and Rh: 9500
Unit Type and Rh: 9500
Unit Type and Rh: 9500
Unit Type and Rh: 9500

## 2021-07-23 LAB — BASIC METABOLIC PANEL
Anion gap: 6 (ref 5–15)
BUN: 55 mg/dL — ABNORMAL HIGH (ref 8–23)
CO2: 24 mmol/L (ref 22–32)
Calcium: 8.2 mg/dL — ABNORMAL LOW (ref 8.9–10.3)
Chloride: 106 mmol/L (ref 98–111)
Creatinine, Ser: 1.58 mg/dL — ABNORMAL HIGH (ref 0.61–1.24)
GFR, Estimated: 40 mL/min — ABNORMAL LOW (ref >60)
Glucose, Bld: 108 mg/dL — ABNORMAL HIGH (ref 70–99)
Potassium: 4.3 mmol/L (ref 3.5–5.1)
Sodium: 136 mmol/L (ref 135–145)

## 2021-07-23 LAB — CBC
HCT: 23.4 % — ABNORMAL LOW (ref 39.0–52.0)
HCT: 25.5 % — ABNORMAL LOW (ref 39.0–52.0)
Hemoglobin: 7.9 g/dL — ABNORMAL LOW (ref 13.0–17.0)
Hemoglobin: 8.2 g/dL — ABNORMAL LOW (ref 13.0–17.0)
MCH: 31.5 pg (ref 26.0–34.0)
MCH: 30.8 pg (ref 26.0–34.0)
MCHC: 33.8 g/dL (ref 30.0–36.0)
MCHC: 32.2 g/dL (ref 30.0–36.0)
MCV: 93.2 fL (ref 80.0–100.0)
MCV: 95.9 fL (ref 80.0–100.0)
Platelets: 128 10*3/uL — ABNORMAL LOW (ref 150–400)
Platelets: 147 10*3/uL — ABNORMAL LOW (ref 150–400)
RBC: 2.51 MIL/uL — ABNORMAL LOW (ref 4.22–5.81)
RBC: 2.66 MIL/uL — ABNORMAL LOW (ref 4.22–5.81)
RDW: 18.5 % — ABNORMAL HIGH (ref 11.5–15.5)
RDW: 18.9 % — ABNORMAL HIGH (ref 11.5–15.5)
WBC: 11.9 10*3/uL — ABNORMAL HIGH (ref 4.0–10.5)
WBC: 17.4 10*3/uL — ABNORMAL HIGH (ref 4.0–10.5)
nRBC: 1 % — ABNORMAL HIGH (ref 0.0–0.2)
nRBC: 0.8 % — ABNORMAL HIGH (ref 0.0–0.2)

## 2021-07-23 LAB — TYPE AND SCREEN
ABO/RH(D): O NEG
Antibody Screen: NEGATIVE
Unit division: 0
Unit division: 0
Unit division: 0
Unit division: 0
Unit division: 0
Unit division: 0

## 2021-07-23 LAB — GLUCOSE, CAPILLARY
Glucose-Capillary: 112 mg/dL — ABNORMAL HIGH (ref 70–99)
Glucose-Capillary: 105 mg/dL — ABNORMAL HIGH (ref 70–99)
Glucose-Capillary: 140 mg/dL — ABNORMAL HIGH (ref 70–99)
Glucose-Capillary: 147 mg/dL — ABNORMAL HIGH (ref 70–99)
Glucose-Capillary: 109 mg/dL — ABNORMAL HIGH (ref 70–99)
Glucose-Capillary: 106 mg/dL — ABNORMAL HIGH (ref 70–99)

## 2021-07-23 LAB — SURGICAL PATHOLOGY

## 2021-07-23 MED ORDER — AMIODARONE HCL 200 MG PO TABS
400.0000 mg | ORAL_TABLET | Freq: Three times a day (TID) | ORAL | Status: AC
  Administered 2021-07-23 – 2021-07-27 (×15): 400 mg via ORAL
  Filled 2021-07-23 (×15): qty 2

## 2021-07-23 MED ORDER — AMIODARONE HCL 200 MG PO TABS
200.0000 mg | ORAL_TABLET | Freq: Every day | ORAL | Status: DC
  Administered 2021-07-28: 200 mg via ORAL
  Filled 2021-07-23: qty 1

## 2021-07-23 NOTE — Progress Notes (Signed)
   07/23/21 1440  Clinical Encounter Type  Visited With Family;Other (Comment) (Male family member called from [phone])  Visit Type Other (Comment)  Referral From Other (Comment)  Consult/Referral To Chaplain   Chaplain responded to page to call [phone]. Male who declines to give his name stated  the patient has an Forensic scientist, but the patient's grandson is coming on Saturday to execute the patient's will and asked if a notary would be available. I advised spiritual care dept's notary will not be available and [place] notaries do not notarize individual wills. This note was prepared by Jeanine Luz, M.Div..  For questions please contact by phone [phone].

## 2021-07-23 NOTE — Progress Notes (Signed)
Occupational Therapy Evaluation Patient Details Name: Caleb [name] MRN: [id number] DOB: 1925/12/25 Today's Date: 07/23/2021    History of Present Illness 87 y.o. male presenting to ED 7/23 from [facility] with hematemesis, BLE edema, sore throat and lower abdominal pain. Patient admitted with acute blood loss and transient hypotension. Recent hospital admission 7/15-7/19 with rhabdo and UTI after sustaining a fall at home and being on the floor for 6 days. Patient d/c'd to [facility] for rehab. PMHx significant for CAD, HLD, obesity and penile cancer with neurogenic bladder.   Clinical Impression   Pt evaluated specifically for self-feeding adaptation and adaptive equipment. Trialed multiple AE and was most successful (biggest impact) with bendable spoon with wide (slightly weighted) handle. 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 Pt from SNF but previously at home living alone and still driving. Goals set for feeding and bedlevel grooming (HOB elevated) but would like to see patient for OOB activity in conjunction with PT for mobility and for deconditioning. OT will continue to follow acutely - he will need SNF post-acute.      Follow Up Recommendations  SNF    Equipment Recommendations  Other (comment) (defer to next venue of care)    Recommendations for Other Services PT consult     Precautions / Restrictions Precautions Precautions: Fall Precaution Comments: watch HR      Mobility Bed Mobility               General bed mobility comments: NT this session    Transfers                 General transfer comment: NT this session    Balance                                           ADL either performed or assessed with clinical judgement   ADL Overall ADL's : Needs assistance/impaired Eating/Feeding: Set up;With adaptive  utensils;Bed level (HOB elevated to chair position) Eating/Feeding Details (indicate cue type and reason): OT trialed multiple assistive devices for eating and the one that worked best was the bendable spoon with wide handle. OT was able to get it at an angle that benefitted patient and Pt was able to grasp it easier with wider grasp and slightly more weight for input Grooming: Wash/dry face;Set up;Bed level (HOB elevated)                                 General ADL Comments: Pt seen specifically for AE for eating. OT will address other ADL needs during next session for OOB activity     Vision         Perception     Praxis      Pertinent Vitals/Pain Pain Assessment: Faces Faces Pain Scale: No hurt Pain Intervention(s): Monitored during session     Hand Dominance Right   Extremity/Trunk Assessment Upper Extremity Assessment Upper Extremity Assessment: Generalized weakness (history of arthritis in Bil Hands)   Lower Extremity Assessment Lower Extremity Assessment: Defer to PT evaluation   Cervical / Trunk Assessment Cervical / Trunk Assessment: Kyphotic   Communication Communication Communication: No difficulties;Other (comment) (enjoys storytelling)   Cognition Arousal/Alertness:  Awake/alert Behavior During Therapy: WFL for tasks assessed/performed Overall Cognitive Status: Impaired/Different from baseline Area of Impairment: Memory                     Memory: Decreased short-term memory         General Comments: enjoys story-telling, did ask therapist name multiple times and "what do you do", repeats himself   General Comments       Exercises     Shoulder Instructions      Home Living Family/patient expects to be discharged to:: Private residence Living Arrangements: Alone Available Help at Discharge:  (None) Type of Home: House Home Access: Stairs to enter [employer] of Steps: 2 Entrance Stairs-Rails: Right;Left;Can  reach both Home Layout: One level     Bathroom Shower/Tub: Occupational psychologist: Handicapped height Bathroom Accessibility: Yes   Home Equipment: Environmental consultant - 4 wheels;Cane - single point;Grab bars - tub/shower   Additional Comments: recent hospitalization and stay at [facility])      Prior Functioning/Environment Level of Independence: Independent with assistive device(s)        Comments: community ambulator, drives, usually has someone with him when shopping        OT Problem List: Decreased strength;Decreased range of motion;Decreased activity tolerance;Decreased knowledge of use of DME or AE;Obesity      OT Treatment/Interventions: Self-care/ADL training;Therapeutic exercise;Energy conservation;DME and/or AE instruction;Therapeutic activities;Patient/family education;Balance training    OT Goals(Current goals can be found in the care plan section) Acute Rehab OT Goals Patient Stated Goal: return home after rehab OT Goal Formulation: With patient Time For Goal Achievement: 08/06/21 Potential to Achieve Goals: Good ADL Goals Pt Will Perform Eating: with set-up;with adaptive utensils;sitting Pt Will Perform Grooming: with set-up;sitting;bed level  OT Frequency: Min 2X/week   Barriers to D/C: Inaccessible home environment;Decreased caregiver support          Co-evaluation              AM-PAC OT "6 Clicks" Daily Activity     Outcome Measure Help from another person eating meals?: A Little Help from another person taking care of personal grooming?: A Little Help from another person toileting, which includes using toliet, bedpan, or urinal?: Total Help from another person bathing (including washing, rinsing, drying)?: A Lot Help from another person to put on and taking off regular upper body clothing?: A Lot Help from another person to put on and taking off regular lower body clothing?: Total 6 Click Score: 12   End of Session Equipment Utilized  During Treatment: Other (comment) (multiple adaptive devices for self-feeding) Nurse Communication: Mobility status;Other (comment) (do NOT throw away adaptive spoon)  Activity Tolerance: Patient tolerated treatment well Patient left: in bed;with call bell/phone within reach;with bed alarm set;with SCD's reapplied  OT Visit Diagnosis: Unsteadiness on feet (R26.81);Muscle weakness (generalized) (M62.81);Feeding difficulties (R63.3)                Time: [id number] OT Time Calculation (min): 33 min Charges:  OT General Charges $OT Visit: 1 Visit OT Evaluation $OT Eval Moderate Complexity: 1 Mod OT Treatments $Self Care/Home Management : 8-22 mins Jesse Sans OTR/L Acute Rehabilitation Services Pager: [phone] Office: [place] 07/23/2021, 12:15 PM

## 2021-07-23 NOTE — Progress Notes (Signed)
PROGRESS NOTE    Caleb [name]  [id number] DOB: 05-Aug-1925 DOA: 07/19/2021 PCP: Sharilyn Sites, MD   Chief Complaint  Patient presents with   GI Problem    EMS arrival. Came from [place]. Staff called bleed tachy HR 130s. Plavix. AxO 4. Denies SOB, pain.    Brief Narrative:   87 year old man on Plavix for history of CAD admitted after seeing blood in mouth, melenic stools present.  Patient resides in a nursing facility.  Admitted to the ICU at the request of GI in case of decompensation and emergent endoscopy.   Patient is difficult to obtain history from. Not forthcoming with answers.  Lives in nursing facility.  Complained of nausea and vomiting.  Noticed some bloody emesis or secretions in the mouth.  Maybe some hemoptysis not clear.  Continued nausea and vomiting.  Dark stools present.  EMS was called.  Heart rates tach to the 130s.  Atrial fibrillation with RVR.  Upon arrival to the ED, tachycardia, soft blood pressures.  Was received 2 L of crystalloid.  In addition 1 unit PRBC administered.  Second unit ordered.  GI consulted.  Recommend ICU admission.  Notably, hemoglobin down a couple points from recent discharge.  Continues to have melenic stools in the ED.  7/23 admitted with GI bleed 7/24 Hgb stable after 3u PRBC, IV PPI 7/25 1u PRBC. Intermittently hypotensive 7/26 HDS overnight however Hg dropped to 6.    Assessment & Plan:   Principal Problem:   Hematemesis Active Problems:   Hyperlipidemia   Coronary artery disease   History of penile cancer   Pressure ulcers of skin of multiple topographic sites   Hyperkalemia   Leukocytosis   SIRS (systemic inflammatory response syndrome) (HCC)   Acute blood loss anemia   Transient hypotension   GIB (gastrointestinal bleeding)  Upper GI bleed,  Hematemesis, melena -Patient presents with acute blood loss anemia, requiring multiple PRBC transfusions. -Continue to monitor H&H closely.  in setting of DAPT. Intermittent  hypotension. Transfused additional unit PRBC overnight -S/p PRBC x 6 total. -GI input greatly appreciated, s/p endoscopy 7/26 significant for multiple duodenal ulcer, patient endorses heavy ibuprofen use secondary to arthritis, I have discussed with him at length. -As well patient is dual antiplatelet therapy, can resume aspirin 3 days per GI recommendation -will need protonix 40 mg twice daily x8 weeks   Hypotension - resolved --Management as above -Continue to hold metoprolol   Paroxysmal Atrial fibrillation: ST and RVR intermittently. --PO amio -- This is new diagnosis during recent hospitalization, he is not anticoagulation candidate given his current GI bleed and multiple transfusions, as well history of falls, as his recent admission due to fall where he was on the floor for prolonged period of time.  AKI - -  improving, creatinine 2.1 on admission, it is 1.5 today.    CAD: Status post PCI to RCA in 2006. -We will hold dual antiplatelet therapy currently, will resume aspirin in 3 days per GI recommendations. --hold lasix, metoprolol   DVT prophylaxis: SCD Code Status: Full Family Communication: D/W som [place] by phone Disposition:   Status is: Inpatient  Remains inpatient appropriate because:IV treatments appropriate due to intensity of illness or inability to take PO  Dispo: The patient is from: SNF              Anticipated d/c is to: SNF              Patient currently is not medically stable to d/c.  Difficult to place patient No       Consultants:  Gastroenterology PCCM   Subjective:  Patient reported feeling better today, no nausea, no vomiting, reports appetite has improved. Objective: Vitals:   07/23/21 0700 07/23/21 0800 07/23/21 0900 07/23/21 1000  BP: 107/65 117/63 98/66 (!) 96/56  Pulse: 90 (!) 107 (!) 108 91  Resp: 20 (!) '21 16 14  '$ Temp: 97.6 F (36.4 C)     TempSrc: Axillary     SpO2: 96% 94% 96% 95%  Weight:        Intake/Output  Summary (Last 24 hours) at 07/23/2021 1109 Last data filed at 07/23/2021 0800 Gross per 24 hour  Intake 1615 ml  Output 1880 ml  Net -265 ml   Filed Weights   07/21/21 0258 07/22/21 0500 07/23/21 0344  Weight: 112.4 kg 112 kg 115.1 kg    Examination:  Awake Alert, frail, deconditioned, no new F.N deficits, Normal affect Symmetrical Chest wall movement, Good air movement bilaterally, CTAB RRR,No Gallops,Rubs or new Murmurs, No Parasternal Heave +ve B.Sounds, Abd Soft, No tenderness, No rebound - guarding or rigidity. No Cyanosis, Clubbing or edema, No new Rash or bruise      Data Reviewed: I have personally reviewed following labs and imaging studies  CBC: Recent Labs  Lab 07/19/21 0603 07/19/21 0643 07/20/21 0017 07/20/21 0703 07/20/21 1053 07/21/21 0228 07/22/21 0400 07/22/21 1510 07/22/21 1921 07/23/21 0614  WBC 14.7*  --  9.3 11.7*   < > 11.9* 13.4* 10.9* 10.4 11.9*  NEUTROABS 12.5*  --  6.8 9.0*  --   --   --   --   --   --   HGB 7.8*   < > 8.3* 8.8*   < > 7.0* 6.0* 7.8* 8.1* 7.9*  HCT 25.4*   < > 23.7* 25.3*   < > 20.6* 18.7* 23.1* 24.3* 23.4*  MCV 103.7*  --  90.1 92.3   < > 93.6 96.9 92.8 92.4 93.2  PLT 223  --  133* 159   < > 124* 126* 122* 133* 128*   < > = values in this interval not displayed.    Basic Metabolic Panel: Recent Labs  Lab 07/20/21 0017 07/20/21 0703 07/21/21 0228 07/22/21 0400 07/23/21 0614  NA 139 139 136 136 136  K 5.1 5.0 4.9 4.5 4.3  CL 107 109 105 106 106  CO2 21* 20* '23 23 24  '$ GLUCOSE 105* 102* 125* 106* 108*  BUN 92* 88* 98* 73* 55*  CREATININE 1.98* 1.90* 1.80* 1.66* 1.58*  CALCIUM 8.5* 8.6* 8.0* 8.0* 8.2*  MG  --   --   --  2.3  --     GFR: Estimated Creatinine Clearance: 36.3 mL/min (A) (by C-G formula based on SCr of 1.58 mg/dL (H)).  Liver Function Tests: Recent Labs  Lab 07/19/21 0603 07/20/21 0017  AST 62* 56*  ALT 82* 74*  ALKPHOS 43 36*  BILITOT 0.7 0.7  PROT 5.1* 4.5*  ALBUMIN 2.3* 2.2*     CBG: Recent Labs  Lab 07/22/21 1500 07/22/21 1955 07/22/21 2345 07/23/21 0313 07/23/21 0748  GLUCAP 121* 117* 116* 112* 105*     Recent Results (from the past 240 hour(s))  Resp Panel by RT-PCR (Flu A&B, Covid) Nasopharyngeal Swab     Status: None   Collection Time: 07/15/21 11:13 AM   Specimen: Nasopharyngeal Swab; Nasopharyngeal(NP) swabs in vial transport medium  Result Value Ref Range Status   SARS Coronavirus 2 by RT PCR NEGATIVE  NEGATIVE Final    Comment: (NOTE) SARS-CoV-2 target nucleic acids are NOT DETECTED.  The SARS-CoV-2 RNA is generally detectable in upper respiratory specimens during the acute phase of infection. The lowest concentration of SARS-CoV-2 viral copies this assay can detect is 138 copies/mL. A negative result does not preclude SARS-Cov-2 infection and should not be used as the sole basis for treatment or other patient management decisions. A negative result may occur with  improper specimen collection/handling, submission of specimen other than nasopharyngeal swab, presence of viral mutation(s) within the areas targeted by this assay, and inadequate number of viral copies(<138 copies/mL). A negative result must be combined with clinical observations, patient history, and epidemiological information. The expected result is Negative.  Fact Sheet for Patients:  EntrepreneurPulse.com.au  Fact Sheet for Healthcare Providers:  IncredibleEmployment.be  This test is no t yet approved or cleared by the [place] FDA and  has been authorized for detection and/or diagnosis of SARS-CoV-2 by FDA under an Emergency Use Authorization (EUA). This EUA will remain  in effect (meaning this test can be used) for the duration of the COVID-19 declaration under Section 564(b)(1) of the Act, 21 U.S.C.section 360bbb-3(b)(1), unless the authorization is terminated  or revoked sooner.       Influenza A by PCR NEGATIVE  NEGATIVE Final   Influenza B by PCR NEGATIVE NEGATIVE Final    Comment: (NOTE) The Xpert Xpress SARS-CoV-2/FLU/RSV plus assay is intended as an aid in the diagnosis of influenza from Nasopharyngeal swab specimens and should not be used as a sole basis for treatment. Nasal washings and aspirates are unacceptable for Xpert Xpress SARS-CoV-2/FLU/RSV testing.  Fact Sheet for Patients: EntrepreneurPulse.com.au  Fact Sheet for Healthcare Providers: IncredibleEmployment.be  This test is not yet approved or cleared by the [place] FDA and has been authorized for detection and/or diagnosis of SARS-CoV-2 by FDA under an Emergency Use Authorization (EUA). This EUA will remain in effect (meaning this test can be used) for the duration of the COVID-19 declaration under Section 564(b)(1) of the Act, 21 U.S.C. section 360bbb-3(b)(1), unless the authorization is terminated or revoked.  Performed at [facility], [address], [place], [place] [address]   Resp Panel by RT-PCR (Flu A&B, Covid) Nasopharyngeal Swab     Status: None   Collection Time: 07/19/21  6:52 AM   Specimen: Nasopharyngeal Swab; Nasopharyngeal(NP) swabs in vial transport medium  Result Value Ref Range Status   SARS Coronavirus 2 by RT PCR NEGATIVE NEGATIVE Final    Comment: (NOTE) SARS-CoV-2 target nucleic acids are NOT DETECTED.  The SARS-CoV-2 RNA is generally detectable in upper respiratory specimens during the acute phase of infection. The lowest concentration of SARS-CoV-2 viral copies this assay can detect is 138 copies/mL. A negative result does not preclude SARS-Cov-2 infection and should not be used as the sole basis for treatment or other patient management decisions. A negative result may occur with  improper specimen collection/handling, submission of specimen other than nasopharyngeal swab, presence of viral mutation(s) within the areas targeted by this assay, and  inadequate number of viral copies(<138 copies/mL). A negative result must be combined with clinical observations, patient history, and epidemiological information. The expected result is Negative.  Fact Sheet for Patients:  EntrepreneurPulse.com.au  Fact Sheet for Healthcare Providers:  IncredibleEmployment.be  This test is no t yet approved or cleared by the [place] FDA and  has been authorized for detection and/or diagnosis of SARS-CoV-2 by FDA under an Emergency Use Authorization (EUA). This EUA will  remain  in effect (meaning this test can be used) for the duration of the COVID-19 declaration under Section 564(b)(1) of the Act, 21 U.S.C.section 360bbb-3(b)(1), unless the authorization is terminated  or revoked sooner.       Influenza A by PCR NEGATIVE NEGATIVE Final   Influenza B by PCR NEGATIVE NEGATIVE Final    Comment: (NOTE) The Xpert Xpress SARS-CoV-2/FLU/RSV plus assay is intended as an aid in the diagnosis of influenza from Nasopharyngeal swab specimens and should not be used as a sole basis for treatment. Nasal washings and aspirates are unacceptable for Xpert Xpress SARS-CoV-2/FLU/RSV testing.  Fact Sheet for Patients: EntrepreneurPulse.com.au  Fact Sheet for Healthcare Providers: IncredibleEmployment.be  This test is not yet approved or cleared by the [place] FDA and has been authorized for detection and/or diagnosis of SARS-CoV-2 by FDA under an Emergency Use Authorization (EUA). This EUA will remain in effect (meaning this test can be used) for the duration of the COVID-19 declaration under Section 564(b)(1) of the Act, 21 U.S.C. section 360bbb-3(b)(1), unless the authorization is terminated or revoked.  Performed at [facility], [place] [address] [place], [place] [address]   MRSA Next Gen by PCR, Nasal     Status: None   Collection Time: 07/19/21  1:35 PM    Specimen: Nasal Mucosa; Nasal Swab  Result Value Ref Range Status   MRSA by PCR Next Gen NOT DETECTED NOT DETECTED Final    Comment: (NOTE) The GeneXpert MRSA Assay (FDA approved for NASAL specimens only), is one component of a comprehensive MRSA colonization surveillance program. It is not intended to diagnose MRSA infection nor to guide or monitor treatment for MRSA infections. Test performance is not FDA approved in patients less than 69 years old. Performed at [facility], [place] [address], [place], [place] [address]          Radiology Studies: No results found.      Scheduled Meds:  sodium chloride   Intravenous Once   sodium chloride   Intravenous Once   amiodarone  400 mg Oral TID   Followed by   Derrill Memo ON 07/28/2021] amiodarone  200 mg Oral Daily   Chlorhexidine Gluconate Cloth  6 each Topical Daily   pantoprazole  40 mg Intravenous Q12H   sodium chloride flush  3 mL Intravenous Q12H   Continuous Infusions:  sodium chloride       LOS: 4 days      Phillips Climes, MD [facility]   To contact the attending provider between 7A-7P or the covering provider during after hours 7P-7A, please log into the web site [email or web address] and access using universal [place] password for that web site. If you do not have the password, please call the hospital operator.  07/23/2021, 11:09 AM

## 2021-07-24 ENCOUNTER — Encounter (HOSPITAL_COMMUNITY): Payer: Self-pay | Admitting: Gastroenterology

## 2021-07-24 DIAGNOSIS — D62 Acute posthemorrhagic anemia: Secondary | ICD-10-CM | POA: Diagnosis not present

## 2021-07-24 DIAGNOSIS — K922 Gastrointestinal hemorrhage, unspecified: Secondary | ICD-10-CM | POA: Diagnosis not present

## 2021-07-24 LAB — CBC
HCT: 25.1 % — ABNORMAL LOW (ref 39.0–52.0)
Hemoglobin: 8.3 g/dL — ABNORMAL LOW (ref 13.0–17.0)
MCH: 31.4 pg (ref 26.0–34.0)
MCHC: 33.1 g/dL (ref 30.0–36.0)
MCV: 95.1 fL (ref 80.0–100.0)
Platelets: 143 10*3/uL — ABNORMAL LOW (ref 150–400)
RBC: 2.64 MIL/uL — ABNORMAL LOW (ref 4.22–5.81)
RDW: 19 % — ABNORMAL HIGH (ref 11.5–15.5)
WBC: 19.7 10*3/uL — ABNORMAL HIGH (ref 4.0–10.5)
nRBC: 0.3 % — ABNORMAL HIGH (ref 0.0–0.2)

## 2021-07-24 LAB — BASIC METABOLIC PANEL
Anion gap: 4 — ABNORMAL LOW (ref 5–15)
BUN: 45 mg/dL — ABNORMAL HIGH (ref 8–23)
CO2: 22 mmol/L (ref 22–32)
Calcium: 7.9 mg/dL — ABNORMAL LOW (ref 8.9–10.3)
Chloride: 106 mmol/L (ref 98–111)
Creatinine, Ser: 1.56 mg/dL — ABNORMAL HIGH (ref 0.61–1.24)
GFR, Estimated: 41 mL/min — ABNORMAL LOW (ref >60)
Glucose, Bld: 104 mg/dL — ABNORMAL HIGH (ref 70–99)
Potassium: 4.3 mmol/L (ref 3.5–5.1)
Sodium: 132 mmol/L — ABNORMAL LOW (ref 135–145)

## 2021-07-24 LAB — GLUCOSE, CAPILLARY
Glucose-Capillary: 115 mg/dL — ABNORMAL HIGH (ref 70–99)
Glucose-Capillary: 112 mg/dL — ABNORMAL HIGH (ref 70–99)
Glucose-Capillary: 131 mg/dL — ABNORMAL HIGH (ref 70–99)
Glucose-Capillary: 106 mg/dL — ABNORMAL HIGH (ref 70–99)

## 2021-07-24 LAB — PROCALCITONIN: Procalcitonin: <0.1 ng/mL

## 2021-07-24 MED ORDER — POLYETHYLENE GLYCOL 3350 17 G PO PACK
34.0000 g | PACK | Freq: Once | ORAL | Status: AC
  Administered 2021-07-24: 34 g via ORAL
  Filled 2021-07-24: qty 2

## 2021-07-24 MED ORDER — PANTOPRAZOLE SODIUM 40 MG PO TBEC
40.0000 mg | DELAYED_RELEASE_TABLET | Freq: Two times a day (BID) | ORAL | Status: DC
  Administered 2021-07-24 – 2021-07-28 (×9): 40 mg via ORAL
  Filled 2021-07-24 (×9): qty 1

## 2021-07-24 MED ORDER — SENNOSIDES-DOCUSATE SODIUM 8.6-50 MG PO TABS
1.0000 | ORAL_TABLET | Freq: Two times a day (BID) | ORAL | Status: DC
  Administered 2021-07-24 – 2021-07-28 (×8): 1 via ORAL
  Filled 2021-07-24 (×9): qty 1

## 2021-07-24 MED ORDER — BISACODYL 5 MG PO TBEC
5.0000 mg | DELAYED_RELEASE_TABLET | Freq: Once | ORAL | Status: AC
  Administered 2021-07-24: 5 mg via ORAL
  Filled 2021-07-24: qty 1

## 2021-07-24 NOTE — Evaluation (Signed)
Physical Therapy Evaluation Patient Details Name: Caleb [name] MRN: [id number] DOB: December 20, 1925 Today's Date: 07/24/2021   History of Present Illness  87 y.o. male presenting to ED 7/23 from [facility] with hematemesis, BLE edema, sore throat and lower abdominal pain. Patient admitted with acute blood loss and transient hypotension. Recent hospital admission 7/15-7/19 with rhabdo and UTI after sustaining a fall at home and being on the floor for 6 days. Patient d/c'd to [facility] for rehab. PMHx significant for CAD, HLD, obesity and penile cancer with neurogenic bladder.  Clinical Impression  Pt fatigued during session due to transferring from bed to recliner via stedy with RN prior to arrival. Pt had increased fear of falling during transfer. HR ranged from 112-118 during session. Pt's deficits include decreased activity tolerance, balance, strength, and mobility. Pt would benefit from PT to improve mentioned deficits and function. Recommend SNF upon d/c. Pt currently has all necessary equipment.    Follow Up Recommendations SNF    Equipment Recommendations  None recommended by PT    Recommendations for Other Services       Precautions / Restrictions Precautions Precautions: Fall Precaution Comments: watch HR Restrictions Weight Bearing Restrictions: No      Mobility  Bed Mobility               General bed mobility comments: Pt in recliner upon entry    Transfers Overall transfer level: Needs assistance   Transfers: Sit to/from Stand Sit to Stand: Max assist;+2 physical assistance         General transfer comment: Max A +2 for sit to stand with assist from stedy and pads. Stood for 8s. Reattempted 2nd stand, but was unsuccessful. Max HR during transfer 118. RN transferred pt using stedy prior to entry  Ambulation/Gait             General Gait Details: not assessed during session  Stairs            Wheelchair Mobility    Modified Rankin  (Stroke Patients Only)       Balance Overall balance assessment: Mild deficits observed, not formally tested         Standing balance support: Bilateral upper extremity supported;During functional activity Standing balance-Leahy Scale: Zero Standing balance comment: demonstrates increased trunk flexion with use of stedy. Maintained balance for <10s                             Pertinent Vitals/Pain Pain Assessment: No/denies pain    Home Living Family/patient expects to be discharged to:: Private residence Living Arrangements: Alone Available Help at Discharge:  (None) Type of Home: House Home Access: Stairs to enter Entrance Stairs-Rails: Right;Left;Can reach both Entrance Stairs-Number of Steps: 2 Home Layout: One level Home Equipment: Kuzel - 4 wheels;Cane - single point;Grab bars - tub/shower Additional Comments: recent hospitalization and stay at SNF [facility]). Only at [facility] for therapy. There for a few days before recent admission.    Prior Function Level of Independence: Independent with assistive device(s)         Comments: community ambulator, drives, usually has someone with him when shopping     Hand Dominance   Dominant Hand: Right    Extremity/Trunk Assessment   Upper Extremity Assessment Upper Extremity Assessment: Generalized weakness    Lower Extremity Assessment Lower Extremity Assessment: Generalized weakness       Communication   Communication: No difficulties;Other (comment) (enjoys storytelling)  Cognition  General Comments General comments (skin integrity, edema, etc.): Increased redness on bil elbows and LEs. R Great toe bleeding after transfer    Exercises General Exercises - Lower Extremity Long Arc Quad: AROM;Both;Seated;10 reps (Decreased ROM)   Assessment/Plan    PT Assessment Patient needs continued PT services  PT Problem List Decreased  strength;Decreased range of motion;Decreased mobility;Decreased balance;Decreased activity tolerance       PT Treatment Interventions Gait training;Functional mobility training;Therapeutic activities;Therapeutic exercise;Balance training    PT Goals (Current goals can be found in the Care Plan section)  Acute Rehab PT Goals Patient Stated Goal: return home after rehab PT Goal Formulation: With patient Time For Goal Achievement: 08/04/21 Potential to Achieve Goals: Good    Frequency Min 2X/week   Barriers to discharge        Co-evaluation               AM-PAC PT "6 Clicks" Mobility  Outcome Measure Help needed turning from your back to your side while in a flat bed without using bedrails?: A Lot Help needed moving from lying on your back to sitting on the side of a flat bed without using bedrails?: A Lot Help needed moving to and from a bed to a chair (including a wheelchair)?: A Lot Help needed standing up from a chair using your arms (e.g., wheelchair or bedside chair)?: Total Help needed to walk in hospital room?: Total Help needed climbing 3-5 steps with a railing? : Total 6 Click Score: 9    End of Session Equipment Utilized During Treatment: Gait belt Activity Tolerance: Patient limited by fatigue Patient left: in bed;with call bell/phone within reach;with chair alarm set Nurse Communication: Mobility status PT Visit Diagnosis: Unsteadiness on feet (R26.81);Muscle weakness (generalized) (M62.81);Other abnormalities of gait and mobility (R26.89)    Time: [id number] PT Time Calculation (min) (ACUTE ONLY): 27 min   Charges:   PT Evaluation $PT Eval Moderate Complexity: 1 Mod PT Treatments $Therapeutic Activity: 8-22 mins        Louie Casa, SPT Acute Rehab: [phone] [id number]   Domingo Dimes 07/24/2021, 2:00 PM

## 2021-07-24 NOTE — Progress Notes (Signed)
Pt arrived on unit via hospital bed by RN and NT on cardiac monitor at 1830. Pt alert and oriented x 4 in no acute distress. VSS. Respirations even and unlabored on RA. Assessment completed. Call bell within reach. Pt encouraged to call for assistance. Bed in low position. Pt oriented to room.

## 2021-07-24 NOTE — Plan of Care (Signed)

## 2021-07-24 NOTE — TOC Initial Note (Signed)
Transition of Care [facility]) - Initial/Assessment Note    Patient Details  Name: Caleb [name] MRN: [id number] Date of Birth: 18-Dec-1925  [facility] of [facility]) CM/SW Contact:    Verdell Carmine, RN Phone Number: 07/24/2021, 6:57 PM  Clinical Narrative:                 From SNF, admitted last week from hospital. Admitted with hematemesis, EGD reveals several duodenal ulcers. Patient is not able to make decisions.  On protonix,  will likely discharge back to facility will need new COVID testing.   Expected Discharge Plan: Skilled Nursing Facility Barriers to Discharge: Continued Medical Work up   Patient Goals and CMS Choice     Choice offered to / list presented to : Adult Children  Expected Discharge Plan and Services Expected Discharge Plan: [place] In-house Referral: Clinical Social Work     Living arrangements for the past 2 months: [place]                                      Prior Living Arrangements/Services Living arrangements for the past 2 months: [place] Lives with:: Facility Resident (readmit after 1 week) Patient language and need for interpreter reviewed:: Yes        Need for Family Participation in Patient Care: Yes (Comment) Care giver support system in place?: Yes (comment)   Criminal Activity/Legal Involvement Pertinent to Current Situation/Hospitalization: No - Comment as needed  Activities of Daily Living      Permission Sought/Granted                  Emotional Assessment     Affect (typically observed): Unable to Assess Orientation: : Fluctuating Orientation (Suspected and/or reported Sundowners) Alcohol / Substance Use: Not Applicable Psych Involvement: No (comment)  Admission diagnosis:  Hematemesis [K92.0] GIB (gastrointestinal bleeding) [K92.2] Acute GI bleeding [K92.2] AKI (acute kidney injury) ([place]) [N17.9] Acute anemia [D64.9] Patient Active Problem List    Diagnosis Date Noted   Hematemesis 07/19/2021   Hyperkalemia 07/19/2021   Leukocytosis 07/19/2021   SIRS (systemic inflammatory response syndrome) ([place]) 07/19/2021   Acute blood loss anemia 07/19/2021   Transient hypotension 07/19/2021   GIB (gastrointestinal bleeding) 07/19/2021   Antiplatelet or antithrombotic long-term use    CKD (chronic kidney disease), stage III ([place]) 07/17/2021   Macrocytic anemia 07/17/2021   Atrial fibrillation ([place]) 07/17/2021   Pressure ulcers of skin of multiple topographic sites 07/17/2021   Rhabdomyolysis 07/11/2021   Fall at home, initial encounter 07/11/2021   Acute lower UTI 07/11/2021   Urinary retention 03/27/2020   Exudative age-related macular degeneration, right eye, with active choroidal neovascularization ([place]) 08/23/2017   BMI 32.0-32.9,adult 02/26/2016   Neurogenic bladder 02/26/2016   Scalp hematoma 01/13/2016   Self-catheterizes urinary bladder 01/13/2016   Pneumothorax, left 01/12/2016   Atypical chest pain 12/02/2015   Muscular deconditioning 02/20/2015   History of penile cancer 01/09/2015   Cellulitis 05/17/2014   Pain in limb- Slight pain- Left Leg 05/03/2014   Discoloration of skin-Left Leg 05/03/2014   Stasis edema with ulcer ([place]) 08/17/2013   Peripheral vascular disease, unspecified ([place]) 07/13/2013   Swelling of limb 07/13/2013   Coronary artery disease 06/07/2013   Left upper extremity numbness 06/07/2013   Hyperlipidemia    Knee pain 08/11/2011   PCP:  Sharilyn Sites, MD Pharmacy:   [facility] [phone] -  [place], [place] [id number] [place] [address] Phone: [phone] Fax: [phone]  [facility] ([facility]) - [place], [place] [address] Phone: [phone] Fax: [phone]     Social Determinants of Health (SDOH) Interventions    Readmission  Risk Interventions Readmission Risk Prevention Plan 07/15/2021 07/14/2021  Post Dischage Appt Complete -  Medication Screening - Complete  Transportation Screening - Complete  Some recent data might be hidden

## 2021-07-24 NOTE — Progress Notes (Signed)
PROGRESS NOTE    Caleb [name]  [id number] DOB: Jun 01, 1925 DOA: 07/19/2021 PCP: Sharilyn Sites, MD   Chief Complaint  Patient presents with   GI Problem    EMS arrival. Came from [place]. Staff called bleed tachy HR 130s. Plavix. AxO 4. Denies SOB, pain.    Brief Narrative:   87 year old man on Plavix for history of CAD admitted after seeing blood in mouth, melenic stools present.  Patient resides in a nursing facility.  Admitted to the ICU at the request of GI in case of decompensation and emergent endoscopy.   Patient is difficult to obtain history from. Not forthcoming with answers.  Lives in nursing facility.  Complained of nausea and vomiting.  Noticed some bloody emesis or secretions in the mouth.  Maybe some hemoptysis not clear.  Continued nausea and vomiting.  Dark stools present.  EMS was called.  Heart rates tach to the 130s.  Atrial fibrillation with RVR.  Upon arrival to the ED, tachycardia, soft blood pressures.  Was received 2 L of crystalloid.  In addition 1 unit PRBC administered.  Second unit ordered.  GI consulted.  Recommend ICU admission.  Notably, hemoglobin down a couple points from recent discharge.  Continues to have melenic stools in the ED.  7/23 admitted with GI bleed 7/24 Hgb stable after 3u PRBC, IV PPI 7/25 1u PRBC. Intermittently hypotensive 7/26 HDS overnight however Hg dropped to 6. endoscopy 7/26 significant for multiple duodenal ulcer,    Assessment & Plan:   Principal Problem:   Hematemesis Active Problems:   Hyperlipidemia   Coronary artery disease   History of penile cancer   Pressure ulcers of skin of multiple topographic sites   Hyperkalemia   Leukocytosis   SIRS (systemic inflammatory response syndrome) (HCC)   Acute blood loss anemia   Transient hypotension   GIB (gastrointestinal bleeding)  Upper GI bleed,  Hematemesis, melena due to upper GI bleed from duodenal ulcers -Patient presents with acute blood loss anemia, requiring  multiple PRBC transfusions. -Continue to monitor H&H closely.  in setting of DAPT. Intermittent hypotension. Transfused additional unit PRBC overnight -S/p PRBC x 6 total. -GI input greatly appreciated, s/p endoscopy 7/26 significant for multiple duodenal ulcer, patient endorses heavy ibuprofen use secondary to arthritis, I have discussed with him at length. -As well patient is dual antiplatelet therapy, can resume aspirin in 3 days from EGD date per GI recommendation -will need protonix 40 mg twice daily x8 weeks -Hemoglobin remained stable, continue to monitor closely   Hypotension - resolved --Management as above -Continue to hold metoprolol   Paroxysmal Atrial fibrillation: ST and RVR intermittently. --PO amio -- This is new diagnosis during recent hospitalization, he is not anticoagulation candidate given his current GI bleed and multiple transfusions, as well history of falls, as his recent admission due to fall where he was on the floor for prolonged period of time.  AKI - -  improving, creatinine 2.1 on admission, it is 1.5 today.    CAD: Status post PCI to RCA in 2006. -We will hold dual antiplatelet therapy currently, will resume aspirin in 3 days per GI recommendations. --hold lasix, metoprolol  Leukocytosis -Ringdown, nontoxic-appearing, most likely stress related, procalcitonin within normal limit, will continue to watch off antibiotics.  DVT prophylaxis: SCD Code Status: Full Family Communication: D/W som Benicio by phone 7/27 Disposition:   Status is: Inpatient  Remains inpatient appropriate because:IV treatments appropriate due to intensity of illness or inability to take PO  Dispo: The  patient is from: SNF              Anticipated d/c is to: SNF              Patient currently is not medically stable to d/c.   Difficult to place patient No       Consultants:  Gastroenterology PCCM   Subjective:  No nausea, no vomiting, reported feeling a bit cold,  otherwise no complaints  Objective: Vitals:   07/24/21 0900 07/24/21 1000 07/24/21 1100 07/24/21 1156  BP: 103/61 (!) 103/58 121/67   Pulse: 94 (!) 109 (!) 105   Resp: 20 18 (!) 22   Temp:    98.6 F (37 C)  TempSrc:    Oral  SpO2: 95% 96% 95%   Weight:        Intake/Output Summary (Last 24 hours) at 07/24/2021 1300 Last data filed at 07/24/2021 0800 Gross per 24 hour  Intake 720 ml  Output 1525 ml  Net -805 ml   Filed Weights   07/21/21 0258 07/22/21 0500 07/23/21 0344  Weight: 112.4 kg 112 kg 115.1 kg    Examination:  Awake Alert, Oriented X 3, frail, deconditioned no new F.N deficits, Normal affect Symmetrical Chest wall movement, Good air movement bilaterally, CTAB RRR,No Gallops,Rubs or new Murmurs, No Parasternal Heave +ve B.Sounds, Abd Soft, No tenderness, No rebound - guarding or rigidity. No Cyanosis, Clubbing or edema, No new Rash or bruise        Data Reviewed: I have personally reviewed following labs and imaging studies  CBC: Recent Labs  Lab 07/19/21 0603 07/19/21 0643 07/20/21 0017 07/20/21 0703 07/20/21 1053 07/22/21 1510 07/22/21 1921 07/23/21 0614 07/23/21 1811 07/24/21 0745  WBC 14.7*  --  9.3 11.7*   < > 10.9* 10.4 11.9* 17.4* 19.7*  NEUTROABS 12.5*  --  6.8 9.0*  --   --   --   --   --   --   HGB 7.8*   < > 8.3* 8.8*   < > 7.8* 8.1* 7.9* 8.2* 8.3*  HCT 25.4*   < > 23.7* 25.3*   < > 23.1* 24.3* 23.4* 25.5* 25.1*  MCV 103.7*  --  90.1 92.3   < > 92.8 92.4 93.2 95.9 95.1  PLT 223  --  133* 159   < > 122* 133* 128* 147* 143*   < > = values in this interval not displayed.    Basic Metabolic Panel: Recent Labs  Lab 07/20/21 0703 07/21/21 0228 07/22/21 0400 07/23/21 0614 07/24/21 0220  NA 139 136 136 136 132*  K 5.0 4.9 4.5 4.3 4.3  CL 109 105 106 106 106  CO2 20* '23 23 24 22  '$ GLUCOSE 102* 125* 106* 108* 104*  BUN 88* 98* 73* 55* 45*  CREATININE 1.90* 1.80* 1.66* 1.58* 1.56*  CALCIUM 8.6* 8.0* 8.0* 8.2* 7.9*  MG  --   --  2.3   --   --     GFR: Estimated Creatinine Clearance: 36.8 mL/min (A) (by C-G formula based on SCr of 1.56 mg/dL (H)).  Liver Function Tests: Recent Labs  Lab 07/19/21 0603 07/20/21 0017  AST 62* 56*  ALT 82* 74*  ALKPHOS 43 36*  BILITOT 0.7 0.7  PROT 5.1* 4.5*  ALBUMIN 2.3* 2.2*    CBG: Recent Labs  Lab 07/23/21 1937 07/23/21 2325 07/24/21 0317 07/24/21 0736 07/24/21 1114  GLUCAP 109* 106* 115* 112* 131*     Recent Results (from the past 240  hour(s))  Resp Panel by RT-PCR (Flu A&B, Covid) Nasopharyngeal Swab     Status: None   Collection Time: 07/15/21 11:13 AM   Specimen: Nasopharyngeal Swab; Nasopharyngeal(NP) swabs in vial transport medium  Result Value Ref Range Status   SARS Coronavirus 2 by RT PCR NEGATIVE NEGATIVE Final    Comment: (NOTE) SARS-CoV-2 target nucleic acids are NOT DETECTED.  The SARS-CoV-2 RNA is generally detectable in upper respiratory specimens during the acute phase of infection. The lowest concentration of SARS-CoV-2 viral copies this assay can detect is 138 copies/mL. A negative result does not preclude SARS-Cov-2 infection and should not be used as the sole basis for treatment or other patient management decisions. A negative result may occur with  improper specimen collection/handling, submission of specimen other than nasopharyngeal swab, presence of viral mutation(s) within the areas targeted by this assay, and inadequate number of viral copies(<138 copies/mL). A negative result must be combined with clinical observations, patient history, and epidemiological information. The expected result is Negative.  Fact Sheet for Patients:  EntrepreneurPulse.com.au  Fact Sheet for Healthcare Providers:  IncredibleEmployment.be  This test is no t yet approved or cleared by the [place] FDA and  has been authorized for detection and/or diagnosis of SARS-CoV-2 by FDA under an Emergency Use Authorization  (EUA). This EUA will remain  in effect (meaning this test can be used) for the duration of the COVID-19 declaration under Section 564(b)(1) of the Act, 21 U.S.C.section 360bbb-3(b)(1), unless the authorization is terminated  or revoked sooner.       Influenza A by PCR NEGATIVE NEGATIVE Final   Influenza B by PCR NEGATIVE NEGATIVE Final    Comment: (NOTE) The Xpert Xpress SARS-CoV-2/FLU/RSV plus assay is intended as an aid in the diagnosis of influenza from Nasopharyngeal swab specimens and should not be used as a sole basis for treatment. Nasal washings and aspirates are unacceptable for Xpert Xpress SARS-CoV-2/FLU/RSV testing.  Fact Sheet for Patients: EntrepreneurPulse.com.au  Fact Sheet for Healthcare Providers: IncredibleEmployment.be  This test is not yet approved or cleared by the [place] FDA and has been authorized for detection and/or diagnosis of SARS-CoV-2 by FDA under an Emergency Use Authorization (EUA). This EUA will remain in effect (meaning this test can be used) for the duration of the COVID-19 declaration under Section 564(b)(1) of the Act, 21 U.S.C. section 360bbb-3(b)(1), unless the authorization is terminated or revoked.  Performed at [facility], [address] [place], [place] [address]   Resp Panel by RT-PCR (Flu A&B, Covid) Nasopharyngeal Swab     Status: None   Collection Time: 07/19/21  6:52 AM   Specimen: Nasopharyngeal Swab; Nasopharyngeal(NP) swabs in vial transport medium  Result Value Ref Range Status   SARS Coronavirus 2 by RT PCR NEGATIVE NEGATIVE Final    Comment: (NOTE) SARS-CoV-2 target nucleic acids are NOT DETECTED.  The SARS-CoV-2 RNA is generally detectable in upper respiratory specimens during the acute phase of infection. The lowest concentration of SARS-CoV-2 viral copies this assay can detect is 138 copies/mL. A negative result does not preclude SARS-Cov-2 infection and should not be  used as the sole basis for treatment or other patient management decisions. A negative result may occur with  improper specimen collection/handling, submission of specimen other than nasopharyngeal swab, presence of viral mutation(s) within the areas targeted by this assay, and inadequate number of viral copies(<138 copies/mL). A negative result must be combined with clinical observations, patient history, and epidemiological information. The expected result is Negative.  Fact  Sheet for Patients:  EntrepreneurPulse.com.au  Fact Sheet for Healthcare Providers:  IncredibleEmployment.be  This test is no t yet approved or cleared by the [place] FDA and  has been authorized for detection and/or diagnosis of SARS-CoV-2 by FDA under an Emergency Use Authorization (EUA). This EUA will remain  in effect (meaning this test can be used) for the duration of the COVID-19 declaration under Section 564(b)(1) of the Act, 21 U.S.C.section 360bbb-3(b)(1), unless the authorization is terminated  or revoked sooner.       Influenza A by PCR NEGATIVE NEGATIVE Final   Influenza B by PCR NEGATIVE NEGATIVE Final    Comment: (NOTE) The Xpert Xpress SARS-CoV-2/FLU/RSV plus assay is intended as an aid in the diagnosis of influenza from Nasopharyngeal swab specimens and should not be used as a sole basis for treatment. Nasal washings and aspirates are unacceptable for Xpert Xpress SARS-CoV-2/FLU/RSV testing.  Fact Sheet for Patients: EntrepreneurPulse.com.au  Fact Sheet for Healthcare Providers: IncredibleEmployment.be  This test is not yet approved or cleared by the [place] FDA and has been authorized for detection and/or diagnosis of SARS-CoV-2 by FDA under an Emergency Use Authorization (EUA). This EUA will remain in effect (meaning this test can be used) for the duration of the COVID-19 declaration under Section  564(b)(1) of the Act, 21 U.S.C. section 360bbb-3(b)(1), unless the authorization is terminated or revoked.  Performed at [facility], [place] [address], [place], [place] [address]   MRSA Next Gen by PCR, Nasal     Status: None   Collection Time: 07/19/21  1:35 PM   Specimen: Nasal Mucosa; Nasal Swab  Result Value Ref Range Status   MRSA by PCR Next Gen NOT DETECTED NOT DETECTED Final    Comment: (NOTE) The GeneXpert MRSA Assay (FDA approved for NASAL specimens only), is one component of a comprehensive MRSA colonization surveillance program. It is not intended to diagnose MRSA infection nor to guide or monitor treatment for MRSA infections. Test performance is not FDA approved in patients less than 66 years old. Performed at [facility], [place] [address] [place], [place] [address]          Radiology Studies: No results found.      Scheduled Meds:  sodium chloride   Intravenous Once   sodium chloride   Intravenous Once   amiodarone  400 mg Oral TID   Followed by   Derrill Memo ON 07/28/2021] amiodarone  200 mg Oral Daily   Chlorhexidine Gluconate Cloth  6 each Topical Daily   pantoprazole  40 mg Intravenous Q12H   senna-docusate  1 tablet Oral BID   sodium chloride flush  3 mL Intravenous Q12H   Continuous Infusions:  sodium chloride       LOS: 5 days      Phillips Climes, MD [facility]   To contact the attending provider between 7A-7P or the covering provider during after hours 7P-7A, please log into the web site [email or web address] and access using universal [place] password for that web site. If you do not have the password, please call the hospital operator.  07/24/2021, 1:00 PM

## 2021-07-24 NOTE — Progress Notes (Signed)
Patient transported to [phone] with cell phone, cell phone charger and other personal belongings. Son, Myer Daggett, notified at number provided in chart of patient's transfer. All questions and concerns addressed.

## 2021-07-25 ENCOUNTER — Ambulatory Visit: Payer: Medicare Other

## 2021-07-25 DIAGNOSIS — D649 Anemia, unspecified: Secondary | ICD-10-CM | POA: Diagnosis not present

## 2021-07-25 DIAGNOSIS — K920 Hematemesis: Secondary | ICD-10-CM | POA: Diagnosis not present

## 2021-07-25 DIAGNOSIS — D62 Acute posthemorrhagic anemia: Secondary | ICD-10-CM | POA: Diagnosis not present

## 2021-07-25 LAB — CBC
HCT: 22.9 % — ABNORMAL LOW (ref 39.0–52.0)
Hemoglobin: 7.5 g/dL — ABNORMAL LOW (ref 13.0–17.0)
MCH: 31.4 pg (ref 26.0–34.0)
MCHC: 32.8 g/dL (ref 30.0–36.0)
MCV: 95.8 fL (ref 80.0–100.0)
Platelets: 142 10*3/uL — ABNORMAL LOW (ref 150–400)
RBC: 2.39 MIL/uL — ABNORMAL LOW (ref 4.22–5.81)
RDW: 18.5 % — ABNORMAL HIGH (ref 11.5–15.5)
WBC: 15.5 10*3/uL — ABNORMAL HIGH (ref 4.0–10.5)
nRBC: 0.1 % (ref 0.0–0.2)

## 2021-07-25 LAB — BASIC METABOLIC PANEL
Anion gap: 7 (ref 5–15)
BUN: 36 mg/dL — ABNORMAL HIGH (ref 8–23)
CO2: 23 mmol/L (ref 22–32)
Calcium: 8.1 mg/dL — ABNORMAL LOW (ref 8.9–10.3)
Chloride: 103 mmol/L (ref 98–111)
Creatinine, Ser: 1.6 mg/dL — ABNORMAL HIGH (ref 0.61–1.24)
GFR, Estimated: 40 mL/min — ABNORMAL LOW (ref >60)
Glucose, Bld: 101 mg/dL — ABNORMAL HIGH (ref 70–99)
Potassium: 4.4 mmol/L (ref 3.5–5.1)
Sodium: 133 mmol/L — ABNORMAL LOW (ref 135–145)

## 2021-07-25 LAB — PREPARE RBC (CROSSMATCH)

## 2021-07-25 MED ORDER — FUROSEMIDE 40 MG PO TABS
40.0000 mg | ORAL_TABLET | Freq: Every day | ORAL | Status: DC
  Administered 2021-07-25 – 2021-07-28 (×4): 40 mg via ORAL
  Filled 2021-07-25 (×4): qty 1

## 2021-07-25 MED ORDER — POLYETHYLENE GLYCOL 3350 17 G PO PACK: 17.0000 g | PACK | Freq: Once | ORAL | Status: DC

## 2021-07-25 MED ORDER — BISACODYL 5 MG PO TBEC
10.0000 mg | DELAYED_RELEASE_TABLET | Freq: Once | ORAL | Status: AC
  Administered 2021-07-25: 10 mg via ORAL
  Filled 2021-07-25: qty 2

## 2021-07-25 MED ORDER — MAGNESIUM HYDROXIDE 400 MG/5ML PO SUSP
30.0000 mL | Freq: Once | ORAL | Status: DC
  Filled 2021-07-25: qty 30

## 2021-07-25 MED ORDER — POLYETHYLENE GLYCOL 3350 17 G PO PACK
34.0000 g | PACK | Freq: Once | ORAL | Status: AC
  Administered 2021-07-25: 34 g via ORAL
  Filled 2021-07-25: qty 2

## 2021-07-25 MED ORDER — SODIUM CHLORIDE 0.9% IV SOLUTION: Freq: Once | INTRAVENOUS | Status: DC

## 2021-07-25 MED ORDER — ASPIRIN EC 81 MG PO TBEC
81.0000 mg | DELAYED_RELEASE_TABLET | Freq: Every day | ORAL | Status: DC
  Administered 2021-07-26 – 2021-07-28 (×3): 81 mg via ORAL
  Filled 2021-07-25 (×3): qty 1

## 2021-07-25 NOTE — Care Management Important Message (Signed)
Important Message  Patient Details  Name: Caleb [name] MRN: [id number] Date of Birth: 20-Feb-1925   Medicare Important Message Given:  Yes     Memory Argue 07/25/2021, 5:26 PM

## 2021-07-25 NOTE — TOC Progression Note (Signed)
Transition of Care [facility]) - Progression Note    Patient Details  Name: Caleb [name] MRN: [id number] Date of Birth: [birth year]  [facility] CM/SW [place], LCSW Phone Number: 07/25/2021, 5:20 PM  Clinical Narrative:    CSW contacted [facility] and was told that admissions will not be back until Monday and they cannot receive patients until then.    Expected Discharge Plan: [place] Barriers to Discharge: Continued Medical Work up  Expected Discharge Plan and Services Expected Discharge Plan: [place] In-house Referral: Clinical Social Work     Living arrangements for the past 2 months: [place]                                       Social Determinants of Health (SDOH) Interventions    Readmission Risk Interventions Readmission Risk Prevention Plan 07/15/2021 07/14/2021  Post Dischage Appt Complete -  Medication Screening - Complete  Transportation Screening - Complete  Some recent data might be hidden

## 2021-07-25 NOTE — Progress Notes (Signed)
PROGRESS NOTE    Caleb [name]  [id number] DOB: [birth year] DOA: 07/19/2021 PCP: Sharilyn Sites, MD   Chief Complaint  Patient presents with   GI Problem    EMS arrival. Came from [place]. Staff called bleed tachy HR 130s. Plavix. AxO 4. Denies SOB, pain.    Brief Narrative:   87 year old man on Plavix for history of CAD admitted after seeing blood in mouth, melenic stools present.  Patient resides in a nursing facility.  Admitted to the ICU at the request of GI in case of decompensation and emergent endoscopy.   Patient is difficult to obtain history from. Not forthcoming with answers.  Lives in nursing facility.  Complained of nausea and vomiting.  Noticed some bloody emesis or secretions in the mouth.  Maybe some hemoptysis not clear.  Continued nausea and vomiting.  Dark stools present.  EMS was called.  Heart rates tach to the 130s.  Atrial fibrillation with RVR.  Upon arrival to the ED, tachycardia, soft blood pressures.  Was received 2 L of crystalloid.  In addition 1 unit PRBC administered.  Second unit ordered.  GI consulted.  Recommend ICU admission.  Notably, hemoglobin down a couple points from recent discharge.  Continues to have melenic stools in the ED.  7/23 admitted with GI bleed 7/24 Hgb stable after 3u PRBC, IV PPI 7/25 1u PRBC. Intermittently hypotensive 7/26 HDS overnight however Hg dropped to 6. Endoscopy 7/26 significant for multiple duodenal ulcer,    Assessment & Plan:   Principal Problem:   Hematemesis Active Problems:   Hyperlipidemia   Coronary artery disease   History of penile cancer   Pressure ulcers of skin of multiple topographic sites   Hyperkalemia   Leukocytosis   SIRS (systemic inflammatory response syndrome) (HCC)   Acute blood loss anemia   Transient hypotension   GIB (gastrointestinal bleeding)  Upper GI bleed,  Hematemesis, melena due to upper GI bleed from duodenal ulcers -Patient presents with acute blood loss anemia, requiring  multiple PRBC transfusions. -Continue to monitor H&H closely.  in setting of DAPT. Intermittent hypotension. Transfused additional unit PRBC overnight -S/p PRBC x 6 total.  And will receive another unit today. -GI input greatly appreciated, s/p endoscopy 7/26 significant for multiple duodenal ulcer, patient endorses heavy ibuprofen use secondary to arthritis, I have discussed with him at length. -As well patient is dual antiplatelet therapy, can resume aspirin in 3 days from EGD date per GI recommendation -will need protonix 40 mg twice daily x8 weeks -Hemoglobin is 7.4 today, will receive 1 unit PRBC today   Hypotension - resolved --Management as above -Continue to hold metoprolol   Paroxysmal Atrial fibrillation: ST and RVR intermittently. --PO amio -- This is new diagnosis during recent hospitalization, he is not anticoagulation candidate given his current GI bleed and multiple transfusions, as well history of falls, as his recent admission due to fall where he was on the floor for prolonged period of time. -Will be started on aspirin.  AKI - -  improving, creatinine 2.1 on admission, it is 1.5 today.    CAD: Status post PCI to RCA in 2006. -We will hold dual antiplatelet therapy currently, will resume aspirin in am --hold lasix, metoprolol  Leukocytosis -trending down , nontoxic-appearing, most likely stress related, procalcitonin within normal limit, will continue to watch off antibiotics.  DVT prophylaxis: SCD Code Status: Full Family Communication: D/W som [place] by phone 7/27 Disposition:   Status is: Inpatient  Remains inpatient appropriate because:IV treatments appropriate  due to intensity of illness or inability to take PO  Dispo: The patient is from: SNF              Anticipated d/c is to: SNF              Patient currently is not medically stable to d/c.   Difficult to place patient No       Consultants:  Gastroenterology PCCM   Subjective:  No  nausea, no vomiting, good appetite, he reports constipation  Objective: Vitals:   07/25/21 1043 07/25/21 1226 07/25/21 1231 07/25/21 1246  BP: 102/82 97/73  97/73  Pulse: (!) 102 (!) 108 93   Resp: '16 18 19   '$ Temp: 98.6 F (37 C) 98.5 F (36.9 C)  98.5 F (36.9 C)  TempSrc: Oral Oral  Oral  SpO2: 95% 94% 95%   Weight:        Intake/Output Summary (Last 24 hours) at 07/25/2021 1418 Last data filed at 07/25/2021 1226 Gross per 24 hour  Intake 555 ml  Output 1600 ml  Net -1045 ml   Filed Weights   07/21/21 0258 07/22/21 0500 07/23/21 0344  Weight: 112.4 kg 112 kg 115.1 kg    Examination:  Awake Alert, Oriented X 3, No new F.N deficits, Normal affect Symmetrical Chest wall movement, Good air movement bilaterally, CTAB RRR,No Gallops,Rubs or new Murmurs, No Parasternal Heave +ve B.Sounds, Abd Soft, No tenderness, No rebound - guarding or rigidity. No Cyanosis, Clubbing ,+1 edema, No new Rash or bruise       Data Reviewed: I have personally reviewed following labs and imaging studies  CBC: Recent Labs  Lab 07/19/21 0603 07/19/21 0643 07/20/21 0017 07/20/21 0703 07/20/21 1053 07/22/21 1921 07/23/21 0614 07/23/21 1811 07/24/21 0745 07/25/21 0118  WBC 14.7*  --  9.3 11.7*   < > 10.4 11.9* 17.4* 19.7* 15.5*  NEUTROABS 12.5*  --  6.8 9.0*  --   --   --   --   --   --   HGB 7.8*   < > 8.3* 8.8*   < > 8.1* 7.9* 8.2* 8.3* 7.5*  HCT 25.4*   < > 23.7* 25.3*   < > 24.3* 23.4* 25.5* 25.1* 22.9*  MCV 103.7*  --  90.1 92.3   < > 92.4 93.2 95.9 95.1 95.8  PLT 223  --  133* 159   < > 133* 128* 147* 143* 142*   < > = values in this interval not displayed.    Basic Metabolic Panel: Recent Labs  Lab 07/21/21 0228 07/22/21 0400 07/23/21 0614 07/24/21 0220 07/25/21 0118  NA 136 136 136 132* 133*  K 4.9 4.5 4.3 4.3 4.4  CL 105 106 106 106 103  CO2 '23 23 24 22 23  '$ GLUCOSE 125* 106* 108* 104* 101*  BUN 98* 73* 55* 45* 36*  CREATININE 1.80* 1.66* 1.58* 1.56* 1.60*   CALCIUM 8.0* 8.0* 8.2* 7.9* 8.1*  MG  --  2.3  --   --   --     GFR: Estimated Creatinine Clearance: 35.9 mL/min (A) (by C-G formula based on SCr of 1.6 mg/dL (H)).  Liver Function Tests: Recent Labs  Lab 07/19/21 0603 07/20/21 0017  AST 62* 56*  ALT 82* 74*  ALKPHOS 43 36*  BILITOT 0.7 0.7  PROT 5.1* 4.5*  ALBUMIN 2.3* 2.2*    CBG: Recent Labs  Lab 07/23/21 2325 07/24/21 0317 07/24/21 0736 07/24/21 1114 07/24/21 1522  GLUCAP 106* 115* 112* 131*  106*     Recent Results (from the past 240 hour(s))  Resp Panel by RT-PCR (Flu A&B, Covid) Nasopharyngeal Swab     Status: None   Collection Time: 07/19/21  6:52 AM   Specimen: Nasopharyngeal Swab; Nasopharyngeal(NP) swabs in vial transport medium  Result Value Ref Range Status   SARS Coronavirus 2 by RT PCR NEGATIVE NEGATIVE Final    Comment: (NOTE) SARS-CoV-2 target nucleic acids are NOT DETECTED.  The SARS-CoV-2 RNA is generally detectable in upper respiratory specimens during the acute phase of infection. The lowest concentration of SARS-CoV-2 viral copies this assay can detect is 138 copies/mL. A negative result does not preclude SARS-Cov-2 infection and should not be used as the sole basis for treatment or other patient management decisions. A negative result may occur with  improper specimen collection/handling, submission of specimen other than nasopharyngeal swab, presence of viral mutation(s) within the areas targeted by this assay, and inadequate number of viral copies(<138 copies/mL). A negative result must be combined with clinical observations, patient history, and epidemiological information. The expected result is Negative.  Fact Sheet for Patients:  EntrepreneurPulse.com.au  Fact Sheet for Healthcare Providers:  IncredibleEmployment.be  This test is no t yet approved or cleared by the [place] FDA and  has been authorized for detection and/or diagnosis of  SARS-CoV-2 by FDA under an Emergency Use Authorization (EUA). This EUA will remain  in effect (meaning this test can be used) for the duration of the COVID-19 declaration under Section 564(b)(1) of the Act, 21 U.S.C.section 360bbb-3(b)(1), unless the authorization is terminated  or revoked sooner.       Influenza A by PCR NEGATIVE NEGATIVE Final   Influenza B by PCR NEGATIVE NEGATIVE Final    Comment: (NOTE) The Xpert Xpress SARS-CoV-2/FLU/RSV plus assay is intended as an aid in the diagnosis of influenza from Nasopharyngeal swab specimens and should not be used as a sole basis for treatment. Nasal washings and aspirates are unacceptable for Xpert Xpress SARS-CoV-2/FLU/RSV testing.  Fact Sheet for Patients: EntrepreneurPulse.com.au  Fact Sheet for Healthcare Providers: IncredibleEmployment.be  This test is not yet approved or cleared by the [place] FDA and has been authorized for detection and/or diagnosis of SARS-CoV-2 by FDA under an Emergency Use Authorization (EUA). This EUA will remain in effect (meaning this test can be used) for the duration of the COVID-19 declaration under Section 564(b)(1) of the Act, 21 U.S.C. section 360bbb-3(b)(1), unless the authorization is terminated or revoked.  Performed at [facility], [place] [address], [place], [place] [address]   MRSA Next Gen by PCR, Nasal     Status: None   Collection Time: 07/19/21  1:35 PM   Specimen: Nasal Mucosa; Nasal Swab  Result Value Ref Range Status   MRSA by PCR Next Gen NOT DETECTED NOT DETECTED Final    Comment: (NOTE) The GeneXpert MRSA Assay (FDA approved for NASAL specimens only), is one component of a comprehensive MRSA colonization surveillance program. It is not intended to diagnose MRSA infection nor to guide or monitor treatment for MRSA infections. Test performance is not FDA approved in patients less than 33 years old. Performed at [facility], [place] [address] [place], [place] [address]          Radiology Studies: No results found.      Scheduled Meds:  sodium chloride   Intravenous Once   amiodarone  400 mg Oral TID   Followed by   Derrill Memo ON 07/28/2021] amiodarone  200 mg Oral Daily   Chlorhexidine Gluconate Cloth  6 each Topical Daily   furosemide  40 mg Oral Daily   pantoprazole  40 mg Oral BID   senna-docusate  1 tablet Oral BID   sodium chloride flush  3 mL Intravenous Q12H   Continuous Infusions:  sodium chloride       LOS: 6 days      Phillips Climes, MD [facility]   To contact the attending provider between 7A-7P or the covering provider during after hours 7P-7A, please log into the web site [email or web address] and access using universal [place] password for that web site. If you do not have the password, please call the hospital operator.  07/25/2021, 2:18 PM

## 2021-07-26 DIAGNOSIS — D62 Acute posthemorrhagic anemia: Secondary | ICD-10-CM | POA: Diagnosis not present

## 2021-07-26 DIAGNOSIS — N179 Acute kidney failure, unspecified: Secondary | ICD-10-CM | POA: Diagnosis not present

## 2021-07-26 DIAGNOSIS — K920 Hematemesis: Secondary | ICD-10-CM | POA: Diagnosis not present

## 2021-07-26 DIAGNOSIS — D72829 Elevated white blood cell count, unspecified: Secondary | ICD-10-CM | POA: Diagnosis not present

## 2021-07-26 LAB — BPAM RBC
Blood Product Expiration Date: 202208022359
ISSUE DATE / TIME: 202207291020
Unit Type and Rh: 9500

## 2021-07-26 LAB — CBC
HCT: 25.6 % — ABNORMAL LOW (ref 39.0–52.0)
Hemoglobin: 8.5 g/dL — ABNORMAL LOW (ref 13.0–17.0)
MCH: 31.1 pg (ref 26.0–34.0)
MCHC: 33.2 g/dL (ref 30.0–36.0)
MCV: 93.8 fL (ref 80.0–100.0)
Platelets: 149 10*3/uL — ABNORMAL LOW (ref 150–400)
RBC: 2.73 MIL/uL — ABNORMAL LOW (ref 4.22–5.81)
RDW: 19 % — ABNORMAL HIGH (ref 11.5–15.5)
WBC: 9.7 10*3/uL (ref 4.0–10.5)
nRBC: 0.2 % (ref 0.0–0.2)

## 2021-07-26 LAB — TYPE AND SCREEN
ABO/RH(D): O NEG
Antibody Screen: NEGATIVE
Unit division: 0

## 2021-07-26 LAB — BASIC METABOLIC PANEL
Anion gap: 5 (ref 5–15)
BUN: 35 mg/dL — ABNORMAL HIGH (ref 8–23)
CO2: 24 mmol/L (ref 22–32)
Calcium: 8 mg/dL — ABNORMAL LOW (ref 8.9–10.3)
Chloride: 105 mmol/L (ref 98–111)
Creatinine, Ser: 1.64 mg/dL — ABNORMAL HIGH (ref 0.61–1.24)
GFR, Estimated: 39 mL/min — ABNORMAL LOW (ref >60)
Glucose, Bld: 104 mg/dL — ABNORMAL HIGH (ref 70–99)
Potassium: 4.3 mmol/L (ref 3.5–5.1)
Sodium: 134 mmol/L — ABNORMAL LOW (ref 135–145)

## 2021-07-26 NOTE — Progress Notes (Signed)
PROGRESS NOTE    Caleb [name]  [id number] DOB: 04/12/1925 DOA: 07/19/2021 PCP: Sharilyn Sites, MD   Chief Complaint  Patient presents with   GI Problem    EMS arrival. Came from [place]. Staff called bleed tachy HR 130s. Plavix. AxO 4. Denies SOB, pain.    Brief Narrative:   87 year old man on Plavix for history of CAD admitted after seeing blood in mouth, melenic stools present.  Patient resides in a nursing facility.  Admitted to the ICU at the request of GI in case of decompensation and emergent endoscopy.   Patient is difficult to obtain history from. Not forthcoming with answers.  Lives in nursing facility.  Complained of nausea and vomiting.  Noticed some bloody emesis or secretions in the mouth.  Maybe some hemoptysis not clear.  Continued nausea and vomiting.  Dark stools present.  EMS was called.  Heart rates tach to the 130s.  Atrial fibrillation with RVR.  Upon arrival to the ED, tachycardia, soft blood pressures.  Was received 2 L of crystalloid.  In addition 1 unit PRBC administered.  Second unit ordered.  GI consulted.  Recommend ICU admission.  Notably, hemoglobin down a couple points from recent discharge.  Continues to have melenic stools in the ED.  7/23 admitted with GI bleed 7/24 Hgb stable after 3u PRBC, IV PPI 7/25 1u PRBC. Intermittently hypotensive 7/26 HDS overnight however Hg dropped to 6. Endoscopy 7/26 significant for multiple duodenal ulcer,    Assessment & Plan:   Principal Problem:   Hematemesis Active Problems:   Hyperlipidemia   Coronary artery disease   History of penile cancer   Pressure ulcers of skin of multiple topographic sites   Hyperkalemia   Leukocytosis   SIRS (systemic inflammatory response syndrome) (HCC)   Acute blood loss anemia   Transient hypotension   GIB (gastrointestinal bleeding)  Upper GI bleed,  Hematemesis, melena due to upper GI bleed from duodenal ulcers -Patient presents with acute blood loss anemia, requiring  multiple PRBC transfusions. -Continue to monitor H&H closely.  in setting of DAPT. Intermittent hypotension. Transfused additional unit PRBC overnight -S/p PRBC x 7 total.   -GI input greatly appreciated, s/p endoscopy 7/26 significant for multiple duodenal ulcer, patient endorses heavy ibuprofen use secondary to arthritis, I have discussed with him at length. -As well patient is dual antiplatelet therapy, can resume aspirin in 3 days from EGD date per GI recommendation -will need protonix 40 mg twice daily x8 weeks -Hemoglobin 8.5 today.  Hypotension  - resolved -Management as above -Continue to hold metoprolol   Paroxysmal Atrial fibrillation: ST and RVR intermittently. --PO amio -- This is new diagnosis during recent hospitalization, he is not anticoagulation candidate given his current GI bleed and multiple transfusions, as well history of falls, as his recent admission due to fall where he was on the floor for prolonged period of time. -Will be started on aspirin.  AKI - -  improving, creatinine 2.1 on admission, it is 1.6 today.  CAD: Status post PCI to RCA in 2006. -We will hold dual antiplatelet therapy currently, back on aspirin, monitor H&H closely -- Toprol on hold given soft blood pressure, resumed on Lasix as he is developing lower extremity edema  Leukocytosis -resolved, nontoxic-appearing, most likely stress related, procalcitonin within normal limit, will continue to watch off antibiotics.  DVT prophylaxis: SCD, no chemical prophylaxis in the setting of GI bleed Code Status: Full Family Communication: D/W som [place] by phone 7/29 Disposition:   Status  is: Inpatient  Remains inpatient appropriate because:IV treatments appropriate due to intensity of illness or inability to take PO  Dispo: The patient is from: SNF              Anticipated d/c is to: SNF              Patient currently is medically stable to d/c.  Patient is medically stable for discharge, he can  leave once bed is available   Difficult to place patient No       Consultants:  Gastroenterology PCCM   Subjective:  Patient reports good bowel movement yesterday, reports appetite has improved, no nausea, no vomiting.  Objective: Vitals:   07/26/21 0016 07/26/21 0400 07/26/21 0736 07/26/21 1129  BP: 106/80 105/70 129/74 110/65  Pulse:  90 96 92  Resp: '19 16 18 18  '$ Temp: 98.4 F (36.9 C) 98.3 F (36.8 C) 98 F (36.7 C) 98.9 F (37.2 C)  TempSrc: Oral Oral Oral Axillary  SpO2:  97% 96% 93%  Weight:        Intake/Output Summary (Last 24 hours) at 07/26/2021 1309 Last data filed at 07/26/2021 1100 Gross per 24 hour  Intake 480 ml  Output 2300 ml  Net -1820 ml   Filed Weights   07/21/21 0258 07/22/21 0500 07/23/21 0344  Weight: 112.4 kg 112 kg 115.1 kg    Examination:  Awake Alert, Oriented X 3, No new F.N deficits, Normal affect Symmetrical Chest wall movement, Good air movement bilaterally, CTAB RRR,No Gallops,Rubs or new Murmurs, No Parasternal Heave +ve B.Sounds, Abd Soft, No tenderness, No rebound - guarding or rigidity. No Cyanosis, Clubbing ,+1 edema, No new Rash or bruise  , left arm bruising      Data Reviewed: I have personally reviewed following labs and imaging studies  CBC: Recent Labs  Lab 07/20/21 0017 07/20/21 0703 07/20/21 1053 07/23/21 0614 07/23/21 1811 07/24/21 0745 07/25/21 0118 07/26/21 0159  WBC 9.3 11.7*   < > 11.9* 17.4* 19.7* 15.5* 9.7  NEUTROABS 6.8 9.0*  --   --   --   --   --   --   HGB 8.3* 8.8*   < > 7.9* 8.2* 8.3* 7.5* 8.5*  HCT 23.7* 25.3*   < > 23.4* 25.5* 25.1* 22.9* 25.6*  MCV 90.1 92.3   < > 93.2 95.9 95.1 95.8 93.8  PLT 133* 159   < > 128* 147* 143* 142* 149*   < > = values in this interval not displayed.    Basic Metabolic Panel: Recent Labs  Lab 07/22/21 0400 07/23/21 0614 07/24/21 0220 07/25/21 0118 07/26/21 0159  NA 136 136 132* 133* 134*  K 4.5 4.3 4.3 4.4 4.3  CL 106 106 106 103 105  CO2 '23  24 22 23 24  '$ GLUCOSE 106* 108* 104* 101* 104*  BUN 73* 55* 45* 36* 35*  CREATININE 1.66* 1.58* 1.56* 1.60* 1.64*  CALCIUM 8.0* 8.2* 7.9* 8.1* 8.0*  MG 2.3  --   --   --   --     GFR: Estimated Creatinine Clearance: 35 mL/min (A) (by C-G formula based on SCr of 1.64 mg/dL (H)).  Liver Function Tests: Recent Labs  Lab 07/20/21 0017  AST 56*  ALT 74*  ALKPHOS 36*  BILITOT 0.7  PROT 4.5*  ALBUMIN 2.2*    CBG: Recent Labs  Lab 07/23/21 2325 07/24/21 0317 07/24/21 0736 07/24/21 1114 07/24/21 1522  GLUCAP 106* 115* 112* 131* 106*     Recent  Results (from the past 240 hour(s))  Resp Panel by RT-PCR (Flu A&B, Covid) Nasopharyngeal Swab     Status: None   Collection Time: 07/19/21  6:52 AM   Specimen: Nasopharyngeal Swab; Nasopharyngeal(NP) swabs in vial transport medium  Result Value Ref Range Status   SARS Coronavirus 2 by RT PCR NEGATIVE NEGATIVE Final    Comment: (NOTE) SARS-CoV-2 target nucleic acids are NOT DETECTED.  The SARS-CoV-2 RNA is generally detectable in upper respiratory specimens during the acute phase of infection. The lowest concentration of SARS-CoV-2 viral copies this assay can detect is 138 copies/mL. A negative result does not preclude SARS-Cov-2 infection and should not be used as the sole basis for treatment or other patient management decisions. A negative result may occur with  improper specimen collection/handling, submission of specimen other than nasopharyngeal swab, presence of viral mutation(s) within the areas targeted by this assay, and inadequate number of viral copies(<138 copies/mL). A negative result must be combined with clinical observations, patient history, and epidemiological information. The expected result is Negative.  Fact Sheet for Patients:  EntrepreneurPulse.com.au  Fact Sheet for Healthcare Providers:  IncredibleEmployment.be  This test is no t yet approved or cleared by the  [place] FDA and  has been authorized for detection and/or diagnosis of SARS-CoV-2 by FDA under an Emergency Use Authorization (EUA). This EUA will remain  in effect (meaning this test can be used) for the duration of the COVID-19 declaration under Section 564(b)(1) of the Act, 21 U.S.C.section 360bbb-3(b)(1), unless the authorization is terminated  or revoked sooner.       Influenza A by PCR NEGATIVE NEGATIVE Final   Influenza B by PCR NEGATIVE NEGATIVE Final    Comment: (NOTE) The Xpert Xpress SARS-CoV-2/FLU/RSV plus assay is intended as an aid in the diagnosis of influenza from Nasopharyngeal swab specimens and should not be used as a sole basis for treatment. Nasal washings and aspirates are unacceptable for Xpert Xpress SARS-CoV-2/FLU/RSV testing.  Fact Sheet for Patients: EntrepreneurPulse.com.au  Fact Sheet for Healthcare Providers: IncredibleEmployment.be  This test is not yet approved or cleared by the [place] FDA and has been authorized for detection and/or diagnosis of SARS-CoV-2 by FDA under an Emergency Use Authorization (EUA). This EUA will remain in effect (meaning this test can be used) for the duration of the COVID-19 declaration under Section 564(b)(1) of the Act, 21 U.S.C. section 360bbb-3(b)(1), unless the authorization is terminated or revoked.  Performed at [facility], [place] [address] [place], [place] [address]   MRSA Next Gen by PCR, Nasal     Status: None   Collection Time: 07/19/21  1:35 PM   Specimen: Nasal Mucosa; Nasal Swab  Result Value Ref Range Status   MRSA by PCR Next Gen NOT DETECTED NOT DETECTED Final    Comment: (NOTE) The GeneXpert MRSA Assay (FDA approved for NASAL specimens only), is one component of a comprehensive MRSA colonization surveillance program. It is not intended to diagnose MRSA infection nor to guide or monitor treatment for MRSA infections. Test performance is not  FDA approved in patients less than 21 years old. Performed at [facility], [place] [address] [place], [place] [address]          Radiology Studies: No results found.      Scheduled Meds:  sodium chloride   Intravenous Once   amiodarone  400 mg Oral TID   Followed by   Derrill Memo ON 07/28/2021] amiodarone  200 mg Oral Daily  aspirin EC  81 mg Oral Daily   Chlorhexidine Gluconate Cloth  6 each Topical Daily   furosemide  40 mg Oral Daily   magnesium hydroxide  30 mL Oral Once   pantoprazole  40 mg Oral BID   polyethylene glycol  17 g Oral Once   senna-docusate  1 tablet Oral BID   sodium chloride flush  3 mL Intravenous Q12H   Continuous Infusions:  sodium chloride       LOS: 7 days      Phillips Climes, MD [facility]   To contact the attending provider between 7A-7P or the covering provider during after hours 7P-7A, please log into the web site [email or web address] and access using universal [place] password for that web site. If you do not have the password, please call the hospital operator.  07/26/2021, 1:09 PM

## 2021-07-27 DIAGNOSIS — K920 Hematemesis: Secondary | ICD-10-CM | POA: Diagnosis not present

## 2021-07-27 LAB — CBC
HCT: 26.3 % — ABNORMAL LOW (ref 39.0–52.0)
Hemoglobin: 8.7 g/dL — ABNORMAL LOW (ref 13.0–17.0)
MCH: 31.4 pg (ref 26.0–34.0)
MCHC: 33.1 g/dL (ref 30.0–36.0)
MCV: 94.9 fL (ref 80.0–100.0)
Platelets: 165 10*3/uL (ref 150–400)
RBC: 2.77 MIL/uL — ABNORMAL LOW (ref 4.22–5.81)
RDW: 17.7 % — ABNORMAL HIGH (ref 11.5–15.5)
WBC: 6.9 10*3/uL (ref 4.0–10.5)
nRBC: 0 % (ref 0.0–0.2)

## 2021-07-27 LAB — BASIC METABOLIC PANEL
Anion gap: 7 (ref 5–15)
BUN: 32 mg/dL — ABNORMAL HIGH (ref 8–23)
CO2: 25 mmol/L (ref 22–32)
Calcium: 8.1 mg/dL — ABNORMAL LOW (ref 8.9–10.3)
Chloride: 104 mmol/L (ref 98–111)
Creatinine, Ser: 1.59 mg/dL — ABNORMAL HIGH (ref 0.61–1.24)
GFR, Estimated: 40 mL/min — ABNORMAL LOW (ref >60)
Glucose, Bld: 119 mg/dL — ABNORMAL HIGH (ref 70–99)
Potassium: 4.4 mmol/L (ref 3.5–5.1)
Sodium: 136 mmol/L (ref 135–145)

## 2021-07-27 MED ORDER — MENTHOL 3 MG MT LOZG
1.0000 | LOZENGE | OROMUCOSAL | Status: DC | PRN
  Administered 2021-07-27 (×2): 3 mg via ORAL
  Filled 2021-07-27: qty 9

## 2021-07-27 NOTE — NC FL2 (Signed)
[place] LEVEL OF CARE SCREENING TOOL     IDENTIFICATION  Patient Name: Caleb [name] Birthdate: 1925/07/14 Sex: male Admission Date (Current Location): 07/19/2021  [facility] and [place] Number:  [employer] and Address:  [facility] [place] [address], [place], [place] [address]      Provider Number: [id number]  Attending Physician Name and Address:  Elgergawy, Silver Huguenin, MD  Relative Name and Phone Number:  Mitchell Churchwell - Son [phone]    Current Level of Care: Hospital Recommended Level of Care: [place] Prior Approval Number:    Date Approved/Denied:   PASRR Number: [id number]  Discharge Plan: SNF    Current Diagnoses: Patient Active Problem List   Diagnosis Date Noted   Hematemesis 07/19/2021   Hyperkalemia 07/19/2021   Leukocytosis 07/19/2021   SIRS (systemic inflammatory response syndrome) ([place]) 07/19/2021   Acute blood loss anemia 07/19/2021   Transient hypotension 07/19/2021   GIB (gastrointestinal bleeding) 07/19/2021   Antiplatelet or antithrombotic long-term use    CKD (chronic kidney disease), stage III ([place]) 07/17/2021   Macrocytic anemia 07/17/2021   Atrial fibrillation ([place]) 07/17/2021   Pressure ulcers of skin of multiple topographic sites 07/17/2021   Rhabdomyolysis 07/11/2021   Fall at home, initial encounter 07/11/2021   Acute lower UTI 07/11/2021   Urinary retention 03/27/2020   Exudative age-related macular degeneration, right eye, with active choroidal neovascularization ([place]) 08/23/2017   BMI 32.0-32.9,adult 02/26/2016   Neurogenic bladder 02/26/2016   Scalp hematoma 01/13/2016   Self-catheterizes urinary bladder 01/13/2016   Pneumothorax, left 01/12/2016   Atypical chest pain 12/02/2015   Muscular deconditioning 02/20/2015   History of penile cancer 01/09/2015   Cellulitis 05/17/2014   Pain in limb- Slight pain- Left Leg 05/03/2014   Discoloration of skin-Left Leg  05/03/2014   Stasis edema with ulcer ([place]) 08/17/2013   Peripheral vascular disease, unspecified ([place]) 07/13/2013   Swelling of limb 07/13/2013   Coronary artery disease 06/07/2013   Left upper extremity numbness 06/07/2013   Hyperlipidemia    Knee pain 08/11/2011    Orientation RESPIRATION BLADDER Height & Weight     Self, Time, Situation, Place  Normal Continent, Indwelling catheter Weight: 246 lb 14.6 oz (112 kg) Height:     BEHAVIORAL SYMPTOMS/MOOD NEUROLOGICAL BOWEL NUTRITION STATUS      Incontinent Diet (Please see DC Summary)  AMBULATORY STATUS COMMUNICATION OF NEEDS Skin   Extensive Assist Verbally PU Stage and Appropriate Care, Other (Comment) (Deep tissue injury on buttocks with foam dressing and heel; unstageable on toe with adhesive bandage and knee; skin tear on abdomen)                       Personal Care Assistance Level of Assistance  Bathing, Feeding, Dressing Bathing Assistance: Maximum assistance Feeding assistance: Independent Dressing Assistance: Limited assistance     Functional Limitations Info  Sight, Hearing, Speech Sight Info: Adequate Hearing Info: Impaired Speech Info: Adequate    SPECIAL CARE FACTORS FREQUENCY  PT (By licensed PT), OT (By licensed OT)     PT Frequency: 5x/week OT Frequency: 5x/week            Contractures Contractures Info: Not present    Additional Factors Info  Code Status, Allergies Code Status Info: Full Allergies Info: NKA           Current Medications (07/27/2021):  This is the current hospital active medication list Current Facility-Administered Medications  Medication Dose  Route Frequency Provider Last Rate Last Admin   0.9 %  sodium chloride infusion (Manually program via Guardrails IV Fluids)   Intravenous Once Elgergawy, Silver Huguenin, MD       0.9 %  sodium chloride infusion  10 mL/hr Intravenous Once Fuller Plan A, MD       acetaminophen (TYLENOL) tablet 650 mg  650 mg Oral Q6H PRN Fuller Plan  A, MD   650 mg at 07/23/21 [id number]   Or   acetaminophen (TYLENOL) suppository 650 mg  650 mg Rectal Q6H PRN Fuller Plan A, MD       albuterol (PROVENTIL) (2.5 MG/3ML) 0.083% nebulizer solution 2.5 mg  2.5 mg Nebulization Q6H PRN Fuller Plan A, MD       amiodarone (PACERONE) tablet 400 mg  400 mg Oral TID Elgergawy, Silver Huguenin, MD   400 mg at 07/27/21 0756   Followed by   Derrill Memo ON 07/28/2021] amiodarone (PACERONE) tablet 200 mg  200 mg Oral Daily Elgergawy, Silver Huguenin, MD       aspirin EC tablet 81 mg  81 mg Oral Daily Elgergawy, Silver Huguenin, MD   81 mg at 07/27/21 0757   Chlorhexidine Gluconate Cloth 2 % PADS 6 each  6 each Topical Daily Hunsucker, Bonna Gains, MD   6 each at 07/27/21 1024   furosemide (LASIX) tablet 40 mg  40 mg Oral Daily Elgergawy, Silver Huguenin, MD   40 mg at 07/27/21 0757   magnesium hydroxide (MILK OF MAGNESIA) suspension 30 mL  30 mL Oral Once Elgergawy, Silver Huguenin, MD       menthol-cetylpyridinium (CEPACOL) lozenge 3 mg  1 lozenge Oral PRN Elgergawy, Silver Huguenin, MD   3 mg at 07/27/21 0623   ondansetron (ZOFRAN) tablet 4 mg  4 mg Oral Q6H PRN Fuller Plan A, MD       Or   ondansetron (ZOFRAN) injection 4 mg  4 mg Intravenous Q6H PRN Tamala Julian, Rondell A, MD   4 mg at 07/19/21 1139   pantoprazole (PROTONIX) EC tablet 40 mg  40 mg Oral BID Elgergawy, Silver Huguenin, MD   40 mg at 07/27/21 0756   phenol (CHLORASEPTIC) mouth spray 1 spray  1 spray Mouth/Throat PRN Margaretha Seeds, MD   1 spray at 07/27/21 0132   polyethylene glycol (MIRALAX / GLYCOLAX) packet 17 g  17 g Oral Once Elgergawy, Silver Huguenin, MD       senna-docusate (Senokot-S) tablet 1 tablet  1 tablet Oral BID Elgergawy, Silver Huguenin, MD   1 tablet at 07/27/21 0756   sodium chloride flush (NS) 0.9 % injection 3 mL  3 mL Intravenous Q12H Fuller Plan A, MD   3 mL at 07/27/21 0757     Discharge Medications: Please see discharge summary for a list of discharge medications.  Relevant Imaging Results:  Relevant Lab  Results:   Additional Information SS# [id number]. Moderna COVID-19 Vaccine 12/12/2020 , 02/09/2020 , 01/09/2020  Benard Halsted, LCSW

## 2021-07-27 NOTE — Progress Notes (Signed)
PROGRESS NOTE    Caleb [name]  [id number] DOB: [birth year] DOA: 07/19/2021 PCP: Sharilyn Sites, MD   Chief Complaint  Patient presents with   GI Problem    EMS arrival. Came from [place]. Staff called bleed tachy HR 130s. Plavix. AxO 4. Denies SOB, pain.    Brief Narrative:   87 year old man on Plavix for history of CAD admitted after seeing blood in mouth, melenic stools present.  Patient resides in a nursing facility.  Admitted to the ICU at the request of GI in case of decompensation and emergent endoscopy.   Patient is difficult to obtain history from. Not forthcoming with answers.  Lives in nursing facility.  Complained of nausea and vomiting.  Noticed some bloody emesis or secretions in the mouth.  Maybe some hemoptysis not clear.  Continued nausea and vomiting.  Dark stools present.  EMS was called.  Heart rates tach to the 130s.  Atrial fibrillation with RVR.  Upon arrival to the ED, tachycardia, soft blood pressures.  Was received 2 L of crystalloid.  In addition 1 unit PRBC administered.  Second unit ordered.  GI consulted.  Recommend ICU admission.  Notably, hemoglobin down a couple points from recent discharge.  Continues to have melenic stools in the ED.  7/23 admitted with GI bleed 7/24 Hgb stable after 3u PRBC, IV PPI 7/25 1u PRBC. Intermittently hypotensive 7/26 HDS overnight however Hg dropped to 6. Endoscopy 7/26 significant for multiple duodenal ulcer,    Assessment & Plan:   Principal Problem:   Hematemesis Active Problems:   Hyperlipidemia   Coronary artery disease   History of penile cancer   Pressure ulcers of skin of multiple topographic sites   Hyperkalemia   Leukocytosis   SIRS (systemic inflammatory response syndrome) (HCC)   Acute blood loss anemia   Transient hypotension   GIB (gastrointestinal bleeding)  Upper GI bleed,  Hematemesis, melena due to upper GI bleed from duodenal ulcers -Patient presents with acute blood loss anemia, requiring  multiple PRBC transfusions. -Continue to monitor H&H closely.  in setting of DAPT. Intermittent hypotension. Transfused additional unit PRBC overnight -S/p PRBC x 7 total.   -GI input greatly appreciated, s/p endoscopy 7/26 significant for multiple duodenal ulcer, patient endorses heavy ibuprofen use secondary to arthritis, I have discussed with him at length. -As well patient is dual antiplatelet therapy, can resume aspirin in 3 days from EGD date per GI recommendation -will need protonix 40 mg twice daily x8 weeks -Hemoglobin 8.5 today.  Hypotension  - resolved -Management as above -Continue to hold metoprolol   Paroxysmal Atrial fibrillation: ST and RVR intermittently. --PO amio -- This is new diagnosis during recent hospitalization, he is not anticoagulation candidate given his current GI bleed and multiple transfusions, as well history of falls, as his recent admission due to fall where he was on the floor for prolonged period of time. -Will be started on aspirin.  AKI - -  improving, creatinine 2.1 on admission, it is 1.6 today.  CAD: Status post PCI to RCA in 2006. -We will hold dual antiplatelet therapy currently, back on aspirin, monitor H&H closely -- Toprol on hold given soft blood pressure, resumed on Lasix as he is developing lower extremity edema  Leukocytosis -resolved, nontoxic-appearing, most likely stress related, procalcitonin within normal limit, will continue to watch off antibiotics.  DVT prophylaxis: SCD, no chemical prophylaxis in the setting of GI bleed Code Status: Full Family Communication: D/W som [name] by phone 7/29 Disposition:   Status  is: Inpatient  Remains inpatient appropriate because:IV treatments appropriate due to intensity of illness or inability to take PO  Dispo: The patient is from: SNF              Anticipated d/c is to: SNF              Patient currently is medically stable to d/c.  Patient is medically stable for discharge, he can  leave once bed is available   Difficult to place patient No       Consultants:  Gastroenterology PCCM   Subjective:  Denies any complaints today, no chest pain, no shortness of breath.  Objective: Vitals:   07/27/21 0346 07/27/21 0725 07/27/21 0800 07/27/21 1200  BP: 105/75 99/65 128/76 131/77  Pulse: 85 99 100 98  Resp: 15 18 (!) 22 20  Temp: 97.9 F (36.6 C) 97.9 F (36.6 C)  98 F (36.7 C)  TempSrc: Axillary Oral  Oral  SpO2: 98% 92% 97% 98%  Weight: 112 kg       Intake/Output Summary (Last 24 hours) at 07/27/2021 1501 Last data filed at 07/27/2021 1436 Gross per 24 hour  Intake 240 ml  Output 4650 ml  Net -4410 ml   Filed Weights   07/22/21 0500 07/23/21 0344 07/27/21 0346  Weight: 112 kg 115.1 kg 112 kg    Examination:  Awake Alert, frail, No new F.N deficits, Normal affect Symmetrical Chest wall movement, Good air movement bilaterally, CTAB RRR,No Gallops,Rubs or new Murmurs, No Parasternal Heave +ve B.Sounds, Abd Soft, No tenderness, No rebound - guarding or rigidity. No Cyanosis, Clubbing , trace edema, No new Rash or bruise         Data Reviewed: I have personally reviewed following labs and imaging studies  CBC: Recent Labs  Lab 07/23/21 1811 07/24/21 0745 07/25/21 0118 07/26/21 0159 07/27/21 0014  WBC 17.4* 19.7* 15.5* 9.7 6.9  HGB 8.2* 8.3* 7.5* 8.5* 8.7*  HCT 25.5* 25.1* 22.9* 25.6* 26.3*  MCV 95.9 95.1 95.8 93.8 94.9  PLT 147* 143* 142* 149* [id number]    Basic Metabolic Panel: Recent Labs  Lab 07/22/21 0400 07/23/21 0614 07/24/21 0220 07/25/21 0118 07/26/21 0159 07/27/21 0014  NA 136 136 132* 133* 134* 136  K 4.5 4.3 4.3 4.4 4.3 4.4  CL 106 106 106 103 105 104  CO2 '23 24 22 23 24 25  '$ GLUCOSE 106* 108* 104* 101* 104* 119*  BUN 73* 55* 45* 36* 35* 32*  CREATININE 1.66* 1.58* 1.56* 1.60* 1.64* 1.59*  CALCIUM 8.0* 8.2* 7.9* 8.1* 8.0* 8.1*  MG 2.3  --   --   --   --   --     GFR: Estimated Creatinine Clearance: 35.6 mL/min  (A) (by C-G formula based on SCr of 1.59 mg/dL (H)).  Liver Function Tests: No results for input(s): AST, ALT, ALKPHOS, BILITOT, PROT, ALBUMIN in the last 168 hours.   CBG: Recent Labs  Lab 07/23/21 2325 07/24/21 0317 07/24/21 0736 07/24/21 1114 07/24/21 1522  GLUCAP 106* 115* 112* 131* 106*     Recent Results (from the past 240 hour(s))  Resp Panel by RT-PCR (Flu A&B, Covid) Nasopharyngeal Swab     Status: None   Collection Time: 07/19/21  6:52 AM   Specimen: Nasopharyngeal Swab; Nasopharyngeal(NP) swabs in vial transport medium  Result Value Ref Range Status   SARS Coronavirus 2 by RT PCR NEGATIVE NEGATIVE Final    Comment: (NOTE) SARS-CoV-2 target nucleic acids are NOT DETECTED.  The SARS-CoV-2  RNA is generally detectable in upper respiratory specimens during the acute phase of infection. The lowest concentration of SARS-CoV-2 viral copies this assay can detect is 138 copies/mL. A negative result does not preclude SARS-Cov-2 infection and should not be used as the sole basis for treatment or other patient management decisions. A negative result may occur with  improper specimen collection/handling, submission of specimen other than nasopharyngeal swab, presence of viral mutation(s) within the areas targeted by this assay, and inadequate number of viral copies(<138 copies/mL). A negative result must be combined with clinical observations, patient history, and epidemiological information. The expected result is Negative.  Fact Sheet for Patients:  EntrepreneurPulse.com.au  Fact Sheet for Healthcare Providers:  IncredibleEmployment.be  This test is no t yet approved or cleared by the [place] FDA and  has been authorized for detection and/or diagnosis of SARS-CoV-2 by FDA under an Emergency Use Authorization (EUA). This EUA will remain  in effect (meaning this test can be used) for the duration of the COVID-19 declaration under  Section 564(b)(1) of the Act, 21 U.S.C.section 360bbb-3(b)(1), unless the authorization is terminated  or revoked sooner.       Influenza A by PCR NEGATIVE NEGATIVE Final   Influenza B by PCR NEGATIVE NEGATIVE Final    Comment: (NOTE) The Xpert Xpress SARS-CoV-2/FLU/RSV plus assay is intended as an aid in the diagnosis of influenza from Nasopharyngeal swab specimens and should not be used as a sole basis for treatment. Nasal washings and aspirates are unacceptable for Xpert Xpress SARS-CoV-2/FLU/RSV testing.  Fact Sheet for Patients: EntrepreneurPulse.com.au  Fact Sheet for Healthcare Providers: IncredibleEmployment.be  This test is not yet approved or cleared by the [place] FDA and has been authorized for detection and/or diagnosis of SARS-CoV-2 by FDA under an Emergency Use Authorization (EUA). This EUA will remain in effect (meaning this test can be used) for the duration of the COVID-19 declaration under Section 564(b)(1) of the Act, 21 U.S.C. section 360bbb-3(b)(1), unless the authorization is terminated or revoked.  Performed at [facility], [place] [address], [place], [place] [address]   MRSA Next Gen by PCR, Nasal     Status: None   Collection Time: 07/19/21  1:35 PM   Specimen: Nasal Mucosa; Nasal Swab  Result Value Ref Range Status   MRSA by PCR Next Gen NOT DETECTED NOT DETECTED Final    Comment: (NOTE) The GeneXpert MRSA Assay (FDA approved for NASAL specimens only), is one component of a comprehensive MRSA colonization surveillance program. It is not intended to diagnose MRSA infection nor to guide or monitor treatment for MRSA infections. Test performance is not FDA approved in patients less than 42 years old. Performed at [facility], [place] [address] [place], [place] [address]          Radiology Studies: No results found.      Scheduled Meds:  sodium chloride   Intravenous Once   amiodarone   400 mg Oral TID   Followed by   Derrill Memo ON 07/28/2021] amiodarone  200 mg Oral Daily   aspirin EC  81 mg Oral Daily   Chlorhexidine Gluconate Cloth  6 each Topical Daily   furosemide  40 mg Oral Daily   magnesium hydroxide  30 mL Oral Once   pantoprazole  40 mg Oral BID   polyethylene glycol  17 g Oral Once   senna-docusate  1 tablet Oral BID   sodium chloride flush  3 mL Intravenous Q12H   Continuous Infusions:  sodium chloride       LOS: 8 days      Phillips Climes, MD [facility]   To contact the attending provider between 7A-7P or the covering provider during after hours 7P-7A, please log into the web site [email or web address] and access using universal [place] password for that web site. If you do not have the password, please call the hospital operator.  07/27/2021, 3:01 PM

## 2021-07-27 NOTE — TOC Progression Note (Signed)
Transition of Care [facility]) - Progression Note    Patient Details  Name: Caleb [name] MRN: [id number] Date of Birth: May 29, 1925  Transition of Care [facility]) CM/SW [place], LCSW Phone Number: 07/27/2021, 12:23 PM  Clinical Narrative:    CSW spoke with patient's son, Rush Landmark regarding potential discharge back to [facility] tomorrow. He reported agreement with plan to transport by [facility]. CSW will follow up with [facility] tomorrow; requested COVID test from MD.    Expected Discharge Plan: [place] Barriers to Discharge: Continued Medical Work up  Expected Discharge Plan and Services Expected Discharge Plan: [place] In-house Referral: Clinical Social Work     Living arrangements for the past 2 months: [place]                                       Social Determinants of Health (SDOH) Interventions    Readmission Risk Interventions Readmission Risk Prevention Plan 07/15/2021 07/14/2021  Post Dischage Appt Complete -  Medication Screening - Complete  Transportation Screening - Complete  Some recent data might be hidden

## 2021-07-28 DIAGNOSIS — K920 Hematemesis: Secondary | ICD-10-CM | POA: Diagnosis not present

## 2021-07-28 DIAGNOSIS — D62 Acute posthemorrhagic anemia: Secondary | ICD-10-CM | POA: Diagnosis not present

## 2021-07-28 DIAGNOSIS — K922 Gastrointestinal hemorrhage, unspecified: Secondary | ICD-10-CM | POA: Diagnosis not present

## 2021-07-28 LAB — BASIC METABOLIC PANEL
Anion gap: 7 (ref 5–15)
BUN: 30 mg/dL — ABNORMAL HIGH (ref 8–23)
CO2: 27 mmol/L (ref 22–32)
Calcium: 8.1 mg/dL — ABNORMAL LOW (ref 8.9–10.3)
Chloride: 103 mmol/L (ref 98–111)
Creatinine, Ser: 1.7 mg/dL — ABNORMAL HIGH (ref 0.61–1.24)
GFR, Estimated: 37 mL/min — ABNORMAL LOW (ref >60)
Glucose, Bld: 103 mg/dL — ABNORMAL HIGH (ref 70–99)
Potassium: 4.2 mmol/L (ref 3.5–5.1)
Sodium: 137 mmol/L (ref 135–145)

## 2021-07-28 LAB — CBC
HCT: 26.8 % — ABNORMAL LOW (ref 39.0–52.0)
Hemoglobin: 8.7 g/dL — ABNORMAL LOW (ref 13.0–17.0)
MCH: 31 pg (ref 26.0–34.0)
MCHC: 32.5 g/dL (ref 30.0–36.0)
MCV: 95.4 fL (ref 80.0–100.0)
Platelets: 196 10*3/uL (ref 150–400)
RBC: 2.81 MIL/uL — ABNORMAL LOW (ref 4.22–5.81)
RDW: 17.4 % — ABNORMAL HIGH (ref 11.5–15.5)
WBC: 6 10*3/uL (ref 4.0–10.5)
nRBC: 0 % (ref 0.0–0.2)

## 2021-07-28 LAB — RESP PANEL BY RT-PCR (FLU A&B, COVID) ARPGX2
Influenza A by PCR: NEGATIVE
Influenza B by PCR: NEGATIVE
SARS Coronavirus 2 by RT PCR: NEGATIVE

## 2021-07-28 MED ORDER — PANTOPRAZOLE SODIUM 40 MG PO TBEC: 40.0000 mg | DELAYED_RELEASE_TABLET | Freq: Two times a day (BID) | ORAL | Status: DC

## 2021-07-28 MED ORDER — ACETAMINOPHEN 325 MG PO TABS: 650.0000 mg | ORAL_TABLET | Freq: Four times a day (QID) | ORAL | Status: DC | PRN

## 2021-07-28 MED ORDER — FUROSEMIDE 20 MG PO TABS: 20.0000 mg | ORAL_TABLET | Freq: Every day | ORAL | Status: DC

## 2021-07-28 MED ORDER — METOPROLOL TARTRATE 25 MG PO TABS
12.5000 mg | ORAL_TABLET | Freq: Two times a day (BID) | ORAL | 0 refills | Status: DC
Start: 2021-07-28 — End: 2021-09-23

## 2021-07-28 NOTE — Progress Notes (Signed)
Occupational Therapy Treatment Patient Details Name: Caleb [name] MRN: [id number] DOB: June 28, 1925 Today's Date: 07/28/2021    History of present illness 87 y.o. male presenting to ED 7/23 from [facility] with hematemesis, BLE edema, sore throat and lower abdominal pain. Patient admitted with acute blood loss and transient hypotension. Recent hospital admission 7/15-7/19 with rhabdo and UTI after sustaining a fall at home and being on the floor for 6 days. Patient d/c'd to [facility] for rehab. PMHx significant for CAD, HLD, obesity and penile cancer with neurogenic bladder.   OT comments  Pt progressing towards established OT goals and motivated to participate in session. Pt seen in collaboration with PT to address transfers and ADLs. Pt performing seated UB ADL with min Guard A. Performing LB ADL with mod A +2. Perfroming sit<>stand transfers with [facility] this session with mod A +2 to power up and steady. Pt presenting with decreased strength, balance, activity tolerance, problem solving, and STM. Recommend continued OT services at SNF and will follow acutely as admitted.   BP 107/66 (78) supine; 117/77 (90) EOB; 92/55 (68) standing.    Follow Up Recommendations  SNF    Equipment Recommendations  Other (comment) (defer to next venue)    Recommendations for Other Services      Precautions / Restrictions Precautions Precautions: Fall Precaution Comments: watch HR Restrictions Weight Bearing Restrictions: No       Mobility Bed Mobility Overal bed mobility: Needs Assistance Bed Mobility: Supine to Sit;Sit to Supine     Supine to sit: Max assist;+2 for safety/equipment;+2 for physical assistance;HOB elevated Sit to supine: Max assist;+2 for physical assistance;+2 for safety/equipment   General bed mobility comments: Max A +2 to bring BLE off of bed and for trunk elevation.    Transfers Overall transfer level: Needs assistance Equipment used: 2 person hand held assist    Sit to Stand: +2 physical assistance;Mod assist         General transfer comment: Mod A +2 for sit<>stand with stedy.    Balance Overall balance assessment: Needs assistance Sitting-balance support: Feet supported;No upper extremity supported Sitting balance-Leahy Scale: Fair Sitting balance - Comments: Min Guard A sitting EOB.   Standing balance support: Bilateral upper extremity supported;During functional activity Standing balance-Leahy Scale: Zero Standing balance comment: Pt requiring mod A to maintain standing balance with stedy       ADL either performed or assessed with clinical judgement   ADL Overall ADL's : Needs assistance/impaired Eating/Feeding: Set up;With adaptive utensils;Bed level (bed in chair position) Eating/Feeding Details (indicate cue type and reason): Pt using regular fork and adaptive spoon for self-feeding. Pt requiring OT to open containers. Grooming: Sitting;Min Psychologist, sport and exercise: Moderate assistance;+2 for physical assistance;+2 for safety/equipment;Stand-pivot (sit<>stand to stedy in preparation for transfer to [facility])     Functional mobility during ADLs: Moderate assistance;+2 for physical assistance;+2 for safety/equipment (sit<>stand with stedy) General ADL Comments: Pt performing UB ADL with min Guard A and LB ADL with mod A +2. Perfroming sit<>stand transfers with [facility] this session with mod A +2 to power up and steady.     Cognition Arousal/Alertness: Awake/alert Behavior During Therapy: WFL for tasks assessed/performed Overall Cognitive Status: Impaired/Different from baseline Area of Impairment: Memory;Following commands;Problem solving;Attention         Current Attention Level: Selective Memory: Decreased short-term memory Following Commands: Follows one step commands consistently;Follows one step commands with increased time     Problem Solving: Slow processing;Requires verbal cues;Difficulty  sequencing General  Comments: enjoys story-telling. Repeating same joke and asking the same questions multiple times during session. Pt with decreased problem solving requiring verbal step-by-step cues for bed mobility. Pt with selective attention as indicated by conversating with TV on at beginning of session. Pt requiring increased time to follow commands. Pt with decreased problem solving requiring verbal cues to determine where to put fork to switch to spoon.              General Comments Pt pleasant and conversational throughout. BP 107/66 (78) supine; 117/77 (90) EOB; 92/55 (68) standing.    Pertinent Vitals/ Pain       Pain Assessment: Faces Faces Pain Scale: Hurts a little bit Pain Location: generalized pain Pain Descriptors / Indicators: Discomfort;Grimacing Pain Intervention(s): Limited activity within patient's tolerance;Monitored during session;Repositioned   Frequency  Min 2X/week        Progress Toward Goals  OT Goals(current goals can now be found in the care plan section)     Acute Rehab OT Goals Patient Stated Goal: return home after rehab OT Goal Formulation: With patient Time For Goal Achievement: 08/06/21 Potential to Achieve Goals: Good ADL Goals Pt Will Perform Eating: with set-up;with adaptive utensils;sitting Pt Will Perform Grooming: with set-up;sitting;bed level  Plan Discharge plan remains appropriate    Co-evaluation    PT/OT/SLP Co-Evaluation/Treatment: Yes Reason for Co-Treatment: For patient/therapist safety;To address functional/ADL transfers PT goals addressed during session: Mobility/safety with mobility;Balance OT goals addressed during session: ADL's and self-care      AM-PAC OT "6 Clicks" Daily Activity     Outcome Measure   Help from another person eating meals?: A Little Help from another person taking care of personal grooming?: A Little Help from another person toileting, which includes using toliet, bedpan, or urinal?: Total Help from  another person bathing (including washing, rinsing, drying)?: A Lot Help from another person to put on and taking off regular upper body clothing?: A Little Help from another person to put on and taking off regular lower body clothing?: Total 6 Click Score: 13    End of Session Equipment Utilized During Treatment: Gait belt;Other (comment) (stedy)  OT Visit Diagnosis: Unsteadiness on feet (R26.81);Muscle weakness (generalized) (M62.81);Feeding difficulties (R63.3)   Activity Tolerance Patient tolerated treatment well   Patient Left in bed;with call bell/phone within reach;with bed alarm set   Nurse Communication Mobility status        Time: [id number] OT Time Calculation (min): 34 min  Charges: OT General Charges $OT Visit: 1 Visit OT Treatments $Self Care/Home Management : 8-22 mins  Shanda Howells, OTDS    Shanda Howells 07/28/2021, 1:35 PM

## 2021-07-28 NOTE — Discharge Instructions (Signed)
Follow with Primary MD Sharilyn Sites, MD /SNF physician  Get CBC, CMP, checked  by Primary MD next visit.    Activity: As tolerated with Full fall precautions use Dueitt/cane & assistance as needed   Disposition SNF   Diet: Heart Healthy .  For Heart failure patients - Check your Weight same time everyday, if you gain over 2 pounds, or you develop in leg swelling, experience more shortness of breath or chest pain, call your Primary MD immediately. Follow Cardiac Low Salt Diet and 1.5 lit/day fluid restriction.   On your next visit with your primary care physician please Get Medicines reviewed and adjusted.   Please request your Prim.MD to go over all Hospital Tests and Procedure/Radiological results at the follow up, please get all Hospital records sent to your Prim MD by signing hospital release before you go home.   If you experience worsening of your admission symptoms, develop shortness of breath, life threatening emergency, suicidal or homicidal thoughts you must seek medical attention immediately by calling 911 or calling your MD immediately  if symptoms less severe.  You Must read complete instructions/literature along with all the possible adverse reactions/side effects for all the Medicines you take and that have been prescribed to you. Take any new Medicines after you have completely understood and accpet all the possible adverse reactions/side effects.   Do not drive, operating heavy machinery, perform activities at heights, swimming or participation in water activities or provide baby sitting services if your were admitted for syncope or siezures until you have seen by Primary MD or a Neurologist and advised to do so again.  Do not drive when taking Pain medications.    Do not take more than prescribed Pain, Sleep and Anxiety Medications  Special Instructions: If you have smoked or chewed Tobacco  in the last 2 yrs please stop smoking, stop any regular Alcohol  and or any  Recreational drug use.  Wear Seat belts while driving.   Please note  You were cared for by a hospitalist during your hospital stay. If you have any questions about your discharge medications or the care you received while you were in the hospital after you are discharged, you can call the unit and asked to speak with the hospitalist on call if the hospitalist that took care of you is not available. Once you are discharged, your primary care physician will handle any further medical issues. Please note that NO REFILLS for any discharge medications will be authorized once you are discharged, as it is imperative that you return to your primary care physician (or establish a relationship with a primary care physician if you do not have one) for your aftercare needs so that they can reassess your need for medications and monitor your lab values.

## 2021-07-28 NOTE — Consult Note (Signed)
   [facility] Inpatient Consult   07/28/2021  Caleb [name] [birth year] [id number]  [place] [facility] [ACO] Patient: Medicare CMS DCE  Primary Care Provider:  Sharilyn Sites, MD [facility]    Patient screened for length of stay hospitalization with noted high risk score for unplanned readmission risk and  to assess for potential [facility] Management service needs for post hospital transition.  Review of patient's medical record reveals patient is for a skilled nursing facilitty level of care.   Plan:  Continue to follow progress and disposition to assess for post hospital care management needs.    For questions contact:   Natividad Brood, RN BSN [facility] Liaison  [phone] business mobile phone Toll free office [phone]  Fax number: [phone] [email or web address]

## 2021-07-28 NOTE — Discharge Summary (Addendum)
Physician Discharge Summary  Caleb [name] [id number] DOB: [birth year] DOA: 07/19/2021  PCP: Sharilyn Sites, MD  Admit date: 07/19/2021 Discharge date: 07/28/2021  Admitted From: SNF Disposition:  SNF/rehab [facility]  Recommendations for Outpatient Follow-up:  check CBC in 3 days Will need to continue Protonix 40 mg oral twice daily x8 weeks, then once daily indefinitely:  Discharge Condition:Stable CODE STATUS:FULL Diet recommendation: Heart Healthy      Discharge Diagnoses:  Principal Problem:   Hematemesis Active Problems:   Hyperlipidemia   Coronary artery disease   History of penile cancer   Pressure ulcers of skin of multiple topographic sites   Hyperkalemia   Leukocytosis   SIRS (systemic inflammatory response syndrome) (HCC)   Acute blood loss anemia   Transient hypotension   GIB (gastrointestinal bleeding)   Brief/Interim Summary:  87 year old man on Plavix for history of CAD admitted after seeing blood in mouth, melenic stools present.  Patient resides in a nursing facility.  Admitted to the ICU at the request of GI in case of decompensation and emergent endoscopy.   Patient is difficult to obtain history from. Not forthcoming with answers.  Lives in nursing facility.  Complained of nausea and vomiting.  Noticed some bloody emesis or secretions in the mouth.  Maybe some hemoptysis not clear.  Continued nausea and vomiting.  Dark stools present.  EMS was called.  Heart rates tach to the 130s.  Atrial fibrillation with RVR.  Upon arrival to the ED, tachycardia, soft blood pressures.  Was received 2 L of crystalloid.  In addition 1 unit PRBC administered.  Second unit ordered.  GI consulted.  Recommend ICU admission.  Notably, hemoglobin down a couple points from recent discharge.  Continues to have melenic stools in the ED.   7/23 admitted with GI bleed 7/24 Hgb stable after 3u PRBC, IV PPI 7/25 1u PRBC. Intermittently hypotensive 7/26 HDS overnight however Hg  dropped to 6. Endoscopy 7/26 significant for multiple duodenal ulcer,  Upper GI bleed,  Hematemesis, melena due to upper GI bleed from duodenal ulcers -Patient presents with acute blood loss anemia, requiring multiple PRBC transfusions. - in setting of DAPT. Intermittent hypotension.  -S/p PRBC x 7 total.   -GI input greatly appreciated, s/p endoscopy 7/26 significant for multiple duodenal ulcer, patient endorses heavy ibuprofen use secondary to arthritis, I have discussed with him at length. -Need for dual antiplatelet therapy, he is to continue aspirin daily . -will need protonix 40 mg twice daily x8 weeks, then once daily indefinitely -Hemoglobin 8.7 today.   Hypotension - resolved -Dumon lower dose metoprolol for A. fib.  Paroxysmal Atrial fibrillation: ST and RVR intermittently. -- When low blood pressure his metoprolol has been held, and he was started on oral amiodarone initially, now blood pressure has improved he is transition to metoprolol as an outpatient, but at a lower dose at 12.5 mg oral twice daily. -- This is new diagnosis during recent hospitalization, he is not anticoagulation candidate given his current GI bleed and multiple transfusions, as well history of falls, as his recent admission due to fall where he was on the floor for prolonged period of time. -started on aspirin.   AKI - -  improving, creatinine 2.1 on admission, it is 1.7 today.   CAD: Status post PCI to RCA in 2006. -Used to be on dual antiplatelet therapy, Plavix has been stopped, back on aspirin. -- Toprol on hold given soft blood pressure, it resumed at a lower dose, resumed on Lasix as he  is developing lower extremity edema   Leukocytosis -resolved, nontoxic-appearing, most likely stress related, procalcitonin within normal limit.    Discharge Instructions  Discharge Instructions     Increase activity slowly   Complete by: As directed    No wound care   Complete by: As directed        Allergies as of 07/28/2021   No Known Allergies      Medication List     STOP taking these medications    clopidogrel 75 MG tablet Commonly known as: PLAVIX   fluconazole 100 MG tablet Commonly known as: DIFLUCAN   vitamin C 1000 MG tablet       TAKE these medications    acetaminophen 325 MG tablet Commonly known as: TYLENOL Take 2 tablets (650 mg total) by mouth every 6 (six) hours as needed for mild pain (or Fever >/= 101). What changed:  how much to take reasons to take this   aspirin EC 81 MG tablet Take 81 mg by mouth daily.   CALCIUM-D PO Take 1 tablet by mouth daily.   furosemide 20 MG tablet Commonly known as: LASIX Take 1 tablet (20 mg total) by mouth daily. What changed: additional instructions   lactulose 10 GM/15ML solution Commonly known as: CHRONULAC Take 10 g by mouth 2 (two) times daily as needed for mild constipation.   metoprolol tartrate 25 MG tablet Commonly known as: LOPRESSOR Take 0.5 tablets (12.5 mg total) by mouth 2 (two) times daily. What changed: See the new instructions.   multivitamin with minerals Tabs tablet Take 1 tablet by mouth daily. Centrum Silver   pantoprazole 40 MG tablet Commonly known as: PROTONIX Take 1 tablet (40 mg total) by mouth 2 (two) times daily.   polyethylene glycol powder 17 GM/SCOOP powder Commonly known as: GLYCOLAX/MIRALAX Take 17 g by mouth daily as needed for mild constipation or moderate constipation.   PreserVision AREDS 2 Caps Take by mouth.   simvastatin 20 MG tablet Commonly known as: ZOCOR TAKE 1 TABLET AT BEDTIME        Contact information for after-discharge care     Destination     [facility] Preferred SNF .   Service: Skilled Nursing Contact information: [id number] [place] [phone]                    No Known Allergies  Consultations: Gastroenterology PCCM   Procedures/Studies: DG Chest 1  View  Result Date: 07/11/2021 CLINICAL DATA:  Fall 6 days ago.  Chest and bilateral hip pain. EXAM: CHEST  1 VIEW COMPARISON:  Radiographs 01/14/2016 and 01/13/2016.  CT 12/20/2005. FINDINGS: 1317 hours. The heart size and mediastinal contours are stable with aortic atherosclerosis. The lungs appear clear. There is no pleural effusion or pneumothorax. There are old left-sided rib fractures and sequela of prior left AC joint injury with heterotopic ossification. No acute osseous findings evident. Telemetry leads overlie the chest. IMPRESSION: No acute cardiopulmonary process. Sequela of previous upper left chest trauma. Electronically Signed   By: Richardean Sale M.D.   On: 07/11/2021 13:54   CT Head Wo Contrast  Result Date: 07/11/2021 CLINICAL DATA:  Fall, head trauma EXAM: CT HEAD WITHOUT CONTRAST TECHNIQUE: Contiguous axial images were obtained from the base of the skull through the vertex without intravenous contrast. COMPARISON:  CT head 01/12/2016 FINDINGS: Brain: No acute intracranial hemorrhage. No focal mass lesion. No CT evidence of acute infarction. No midline shift or mass  effect. No hydrocephalus. Basilar cisterns are patent. There are periventricular and subcortical white matter hypodensities. Generalized cortical atrophy. Vascular: No hyperdense vessel or unexpected calcification. Skull: Normal. Negative for fracture or focal lesion. Sinuses/Orbits: Paranasal sinuses and mastoid air cells are clear. Orbits are clear. Other: None. IMPRESSION: 1. No intracranial trauma. 2. Advanced atrophy and white matter microvascular disease. Electronically Signed   By: Suzy Bouchard M.D.   On: 07/11/2021 14:38   DG CHEST PORT 1 VIEW  Result Date: 07/19/2021 CLINICAL DATA:  Hemoptysis yesterday EXAM: PORTABLE CHEST 1 VIEW COMPARISON:  07/11/2021 FINDINGS: Normal heart size and mediastinal contours. Low volume chest. No acute infiltrate or edema. No effusion or pneumothorax. No acute osseous findings.  Artifact from EKG leads. IMPRESSION: Stable exam.  No acute finding. Electronically Signed   By: Monte Fantasia M.D.   On: 07/19/2021 09:03   ECHOCARDIOGRAM COMPLETE  Result Date: 07/12/2021    ECHOCARDIOGRAM REPORT   Patient Name:   LEROY LARISCY Date of Exam: 07/12/2021 Medical Rec #:  [id number]       Height:       70.0 in Accession #:    [id number]      Weight:       237.0 lb Date of Birth:  03-Apr-1925        BSA:          2.243 m Patient Age:    41 years        BP:           107/64 mmHg Patient Gender: M               HR:           112 bpm. Exam Location:  [facility] Procedure: 2D Echo, Cardiac Doppler and Color Doppler Indications:    I48.0 Paroxysmal atrial fibrillation  History:        Patient has prior history of Echocardiogram examinations, most                 recent 12/22/2005. CAD; Risk Factors:Hypertension and                 Dyslipidemia. Cancer.  Sonographer:    Jonelle Sidle Dance Referring Phys: [id number] [place] D [place]  1. Left ventricular ejection fraction, by estimation, is 65 to 70%. The left ventricle has hyperdynamic function. The left ventricle has no regional wall motion abnormalities. There is mild concentric left ventricular hypertrophy. Left ventricular diastolic function could not be evaluated.  2. Right ventricular systolic function is normal. The right ventricular size is normal. There is normal pulmonary artery systolic pressure.  3. Left atrial size was moderately dilated.  4. Right atrial size was mildly dilated.  5. The mitral valve is normal in structure. No evidence of mitral valve regurgitation.  6. The aortic valve is tricuspid. There is moderate calcification of the aortic valve. There is moderate thickening of the aortic valve. Aortic valve regurgitation is not visualized. Mild to moderate aortic valve stenosis.  7. Aortic dilatation noted. There is mild dilatation of the aortic root, measuring 40 mm. There is mild dilatation of the ascending aorta, measuring 39 mm.   8. The inferior vena cava is normal in size with greater than 50% respiratory variability, suggesting right atrial pressure of 3 mmHg. FINDINGS  Left Ventricle: Left ventricular ejection fraction, by estimation, is 65 to 70%. The left ventricle has hyperdynamic function. The left ventricle has no regional wall motion abnormalities. The left ventricular internal cavity  size was normal in size. There is mild concentric left ventricular hypertrophy. Left ventricular diastolic function could not be evaluated due to atrial fibrillation. Left ventricular diastolic function could not be evaluated. Right Ventricle: The right ventricular size is normal. No increase in right ventricular wall thickness. Right ventricular systolic function is normal. There is normal pulmonary artery systolic pressure. The tricuspid regurgitant velocity is 2.72 m/s, and  with an assumed right atrial pressure of 3 mmHg, the estimated right ventricular systolic pressure is [id number] mmHg. Left Atrium: Left atrial size was moderately dilated. Right Atrium: Right atrial size was mildly dilated. Pericardium: There is no evidence of pericardial effusion. Mitral Valve: The mitral valve is normal in structure. Mild to moderate mitral annular calcification. No evidence of mitral valve regurgitation. Tricuspid Valve: The tricuspid valve is normal in structure. Tricuspid valve regurgitation is trivial. Aortic Valve: The aortic valve is tricuspid. There is moderate calcification of the aortic valve. There is moderate thickening of the aortic valve. Aortic valve regurgitation is not visualized. Mild to moderate aortic stenosis is present. Aortic valve mean gradient measures 14.8 mmHg. Aortic valve peak gradient measures 28.6 mmHg. Aortic valve area, by VTI measures 1.35 cm. Pulmonic Valve: The pulmonic valve was not well visualized. Pulmonic valve regurgitation is not visualized. Aorta: Aortic dilatation noted. There is mild dilatation of the aortic root,  measuring 40 mm. There is mild dilatation of the ascending aorta, measuring 39 mm. Venous: The inferior vena cava is normal in size with greater than 50% respiratory variability, suggesting right atrial pressure of 3 mmHg. IAS/Shunts: No atrial level shunt detected by color flow Doppler.  LEFT VENTRICLE PLAX 2D LVIDd:         3.08 cm LVIDs:         2.98 cm LV PW:         1.36 cm LV IVS:        1.24 cm LVOT diam:     2.10 cm LV SV:         57 LV SV Index:   26 LVOT Area:     3.46 cm  RIGHT VENTRICLE RV Basal diam:  3.21 cm TAPSE (M-mode): 1.7 cm LEFT ATRIUM             Index       RIGHT ATRIUM           Index LA diam:        4.60 cm 2.05 cm/m  RA Area:     16.30 cm LA Vol (A2C):   74.8 ml 33.34 ml/m RA Volume:   36.70 ml  16.36 ml/m LA Vol (A4C):   73.9 ml 32.94 ml/m LA Biplane Vol: 79.1 ml 35.26 ml/m  AORTIC VALVE AV Area (Vmax):    1.26 cm AV Area (Vmean):   1.29 cm AV Area (VTI):     1.35 cm AV Vmax:           267.50 cm/s AV Vmean:          178.500 cm/s AV VTI:            0.427 m AV Peak Grad:      28.6 mmHg AV Mean Grad:      14.8 mmHg LVOT Vmax:         97.55 cm/s LVOT Vmean:        66.650 cm/s LVOT VTI:          0.166 m LVOT/AV VTI ratio: 0.39  AORTA Ao Root diam: 4.00 cm  Ao Asc diam:  3.90 cm MITRAL VALVE               TRICUSPID VALVE MV Area (PHT): 2.81 cm    TR Peak grad:   29.6 mmHg MV Decel Time: 270 msec    TR Vmax:        272.00 cm/s MV E velocity: 99.90 cm/s                            SHUNTS                            Systemic VTI:  0.17 m                            Systemic Diam: 2.10 cm Dani Gobble Croitoru MD Electronically signed by Sanda Klein MD Signature Date/Time: 07/12/2021/5:32:23 PM    Final    DG Hips Bilat W or Wo Pelvis 3-4 Views  Result Date: 07/11/2021 CLINICAL DATA:  Bilateral hip pain since falling 6 days ago. EXAM: DG HIP (WITH OR WITHOUT PELVIS) 3-4V BILAT COMPARISON:  Radiographs 05/09/2015. FINDINGS: The bones appear demineralized. There is no evidence of acute fracture,  dislocation or bone destruction. The hip joint spaces are preserved. There are mild degenerative changes in the lower lumbar spine and sacroiliac joints. There are surgical clips in both groins and scattered vascular calcifications. IMPRESSION: No evidence of acute fracture, dislocation or bone destruction. Scattered vascular calcifications. Electronically Signed   By: Richardean Sale M.D.   On: 07/11/2021 13:56      Subjective:  He denies any complaints today, reports he had a good bowel movement, no dyspnea, no chest pain.  Discharge Exam: Vitals:   07/28/21 1208 07/28/21 1656  BP: 107/66 106/67  Pulse: 100 96  Resp: 18 18  Temp: 98.4 F (36.9 C) 98.4 F (36.9 C)  SpO2: 95%    Vitals:   07/28/21 0740 07/28/21 0800 07/28/21 1208 07/28/21 1656  BP: 135/77 118/75 107/66 106/67  Pulse: 98 94 100 96  Resp: '18 20 18 18  '$ Temp: 98.7 F (37.1 C)  98.4 F (36.9 C) 98.4 F (36.9 C)  TempSrc: Oral  Oral Oral  SpO2: 91% 93% 95%   Weight:        General: Pt is alert, awake, not in acute distress Cardiovascular: RRR, S1/S2 +, no rubs, no gallops Respiratory: CTA bilaterally, no wheezing, no rhonchi Abdominal: Soft, NT, ND, bowel sounds + Extremities: +1 edema, no cyanosis    The results of significant diagnostics from this hospitalization (including imaging, microbiology, ancillary and laboratory) are listed below for reference.     Microbiology: Recent Results (from the past 240 hour(s))  Resp Panel by RT-PCR (Flu A&B, Covid) Nasopharyngeal Swab     Status: None   Collection Time: 07/19/21  6:52 AM   Specimen: Nasopharyngeal Swab; Nasopharyngeal(NP) swabs in vial transport medium  Result Value Ref Range Status   SARS Coronavirus 2 by RT PCR NEGATIVE NEGATIVE Final    Comment: (NOTE) SARS-CoV-2 target nucleic acids are NOT DETECTED.  The SARS-CoV-2 RNA is generally detectable in upper respiratory specimens during the acute phase of infection. The lowest concentration of  SARS-CoV-2 viral copies this assay can detect is 138 copies/mL. A negative result does not preclude SARS-Cov-2 infection and should not be used as the sole basis for treatment or other patient  management decisions. A negative result may occur with  improper specimen collection/handling, submission of specimen other than nasopharyngeal swab, presence of viral mutation(s) within the areas targeted by this assay, and inadequate number of viral copies(<138 copies/mL). A negative result must be combined with clinical observations, patient history, and epidemiological information. The expected result is Negative.  Fact Sheet for Patients:  EntrepreneurPulse.com.au  Fact Sheet for Healthcare Providers:  IncredibleEmployment.be  This test is no t yet approved or cleared by the [place] FDA and  has been authorized for detection and/or diagnosis of SARS-CoV-2 by FDA under an Emergency Use Authorization (EUA). This EUA will remain  in effect (meaning this test can be used) for the duration of the COVID-19 declaration under Section 564(b)(1) of the Act, 21 U.S.C.section 360bbb-3(b)(1), unless the authorization is terminated  or revoked sooner.       Influenza A by PCR NEGATIVE NEGATIVE Final   Influenza B by PCR NEGATIVE NEGATIVE Final    Comment: (NOTE) The Xpert Xpress SARS-CoV-2/FLU/RSV plus assay is intended as an aid in the diagnosis of influenza from Nasopharyngeal swab specimens and should not be used as a sole basis for treatment. Nasal washings and aspirates are unacceptable for Xpert Xpress SARS-CoV-2/FLU/RSV testing.  Fact Sheet for Patients: EntrepreneurPulse.com.au  Fact Sheet for Healthcare Providers: IncredibleEmployment.be  This test is not yet approved or cleared by the [place] FDA and has been authorized for detection and/or diagnosis of SARS-CoV-2 by FDA under an Emergency Use  Authorization (EUA). This EUA will remain in effect (meaning this test can be used) for the duration of the COVID-19 declaration under Section 564(b)(1) of the Act, 21 U.S.C. section 360bbb-3(b)(1), unless the authorization is terminated or revoked.  Performed at [facility], [place] [address] [place], [place] [address]   MRSA Next Gen by PCR, Nasal     Status: None   Collection Time: 07/19/21  1:35 PM   Specimen: Nasal Mucosa; Nasal Swab  Result Value Ref Range Status   MRSA by PCR Next Gen NOT DETECTED NOT DETECTED Final    Comment: (NOTE) The GeneXpert MRSA Assay (FDA approved for NASAL specimens only), is one component of a comprehensive MRSA colonization surveillance program. It is not intended to diagnose MRSA infection nor to guide or monitor treatment for MRSA infections. Test performance is not FDA approved in patients less than 15 years old. Performed at [facility], [place] [address], [place], [place] [address]   Resp Panel by RT-PCR (Flu A&B, Covid) Nasopharyngeal Swab     Status: None   Collection Time: 07/28/21 10:16 AM   Specimen: Nasopharyngeal Swab; Nasopharyngeal(NP) swabs in vial transport medium  Result Value Ref Range Status   SARS Coronavirus 2 by RT PCR NEGATIVE NEGATIVE Final    Comment: (NOTE) SARS-CoV-2 target nucleic acids are NOT DETECTED.  The SARS-CoV-2 RNA is generally detectable in upper respiratory specimens during the acute phase of infection. The lowest concentration of SARS-CoV-2 viral copies this assay can detect is 138 copies/mL. A negative result does not preclude SARS-Cov-2 infection and should not be used as the sole basis for treatment or other patient management decisions. A negative result may occur with  improper specimen collection/handling, submission of specimen other than nasopharyngeal swab, presence of viral mutation(s) within the areas targeted by this assay, and inadequate number of viral copies(<138 copies/mL). A  negative result must be combined with clinical observations, patient history, and epidemiological information. The expected result is Negative.  Fact Sheet  for Patients:  EntrepreneurPulse.com.au  Fact Sheet for Healthcare Providers:  IncredibleEmployment.be  This test is no t yet approved or cleared by the [place] FDA and  has been authorized for detection and/or diagnosis of SARS-CoV-2 by FDA under an Emergency Use Authorization (EUA). This EUA will remain  in effect (meaning this test can be used) for the duration of the COVID-19 declaration under Section 564(b)(1) of the Act, 21 U.S.C.section 360bbb-3(b)(1), unless the authorization is terminated  or revoked sooner.       Influenza A by PCR NEGATIVE NEGATIVE Final   Influenza B by PCR NEGATIVE NEGATIVE Final    Comment: (NOTE) The Xpert Xpress SARS-CoV-2/FLU/RSV plus assay is intended as an aid in the diagnosis of influenza from Nasopharyngeal swab specimens and should not be used as a sole basis for treatment. Nasal washings and aspirates are unacceptable for Xpert Xpress SARS-CoV-2/FLU/RSV testing.  Fact Sheet for Patients: EntrepreneurPulse.com.au  Fact Sheet for Healthcare Providers: IncredibleEmployment.be  This test is not yet approved or cleared by the [place] FDA and has been authorized for detection and/or diagnosis of SARS-CoV-2 by FDA under an Emergency Use Authorization (EUA). This EUA will remain in effect (meaning this test can be used) for the duration of the COVID-19 declaration under Section 564(b)(1) of the Act, 21 U.S.C. section 360bbb-3(b)(1), unless the authorization is terminated or revoked.  Performed at [facility], [place] [address] [place], [place] [address]      Labs: BNP (last 3 results) No results for input(s): BNP in the last 8760 hours. Basic Metabolic Panel: Recent Labs  Lab 07/22/21 0400  07/23/21 0614 07/24/21 0220 07/25/21 0118 07/26/21 0159 07/27/21 0014 07/28/21 0104  NA 136   < > 132* 133* 134* 136 137  K 4.5   < > 4.3 4.4 4.3 4.4 4.2  CL 106   < > 106 103 105 104 103  CO2 23   < > '22 23 24 25 27  '$ GLUCOSE 106*   < > 104* 101* 104* 119* 103*  BUN 73*   < > 45* 36* 35* 32* 30*  CREATININE 1.66*   < > 1.56* 1.60* 1.64* 1.59* 1.70*  CALCIUM 8.0*   < > 7.9* 8.1* 8.0* 8.1* 8.1*  MG 2.3  --   --   --   --   --   --    < > = values in this interval not displayed.   Liver Function Tests: No results for input(s): AST, ALT, ALKPHOS, BILITOT, PROT, ALBUMIN in the last 168 hours. No results for input(s): LIPASE, AMYLASE in the last 168 hours. No results for input(s): AMMONIA in the last 168 hours. CBC: Recent Labs  Lab 07/24/21 0745 07/25/21 0118 07/26/21 0159 07/27/21 0014 07/28/21 0104  WBC 19.7* 15.5* 9.7 6.9 6.0  HGB 8.3* 7.5* 8.5* 8.7* 8.7*  HCT 25.1* 22.9* 25.6* 26.3* 26.8*  MCV 95.1 95.8 93.8 94.9 95.4  PLT 143* 142* 149* 165 196   Cardiac Enzymes: No results for input(s): CKTOTAL, CKMB, CKMBINDEX, TROPONINI in the last 168 hours. BNP: Invalid input(s): POCBNP CBG: Recent Labs  Lab 07/23/21 2325 07/24/21 0317 07/24/21 0736 07/24/21 1114 07/24/21 1522  GLUCAP 106* 115* 112* 131* 106*   D-Dimer No results for input(s): DDIMER in the last 72 hours. Hgb A1c No results for input(s): HGBA1C in the last 72 hours. Lipid Profile No results for input(s): CHOL, HDL, LDLCALC, TRIG, CHOLHDL, LDLDIRECT in the last 72 hours. Thyroid function studies No results for  input(s): TSH, T4TOTAL, T3FREE, THYROIDAB in the last 72 hours.  Invalid input(s): FREET3 Anemia work up No results for input(s): VITAMINB12, FOLATE, FERRITIN, TIBC, IRON, RETICCTPCT in the last 72 hours. Urinalysis    Component Value Date/Time   COLORURINE YELLOW 07/19/2021 1018   APPEARANCEUR CLEAR 07/19/2021 1018   LABSPEC 1.014 07/19/2021 1018   PHURINE 5.0 07/19/2021 1018   GLUCOSEU  NEGATIVE 07/19/2021 1018   HGBUR NEGATIVE 07/19/2021 [place] 07/19/2021 1018   BILIRUBINUR negative 03/25/2020 1156   KETONESUR NEGATIVE 07/19/2021 1018   PROTEINUR NEGATIVE 07/19/2021 1018   UROBILINOGEN 0.2 03/25/2020 1156   NITRITE NEGATIVE 07/19/2021 1018   LEUKOCYTESUR NEGATIVE 07/19/2021 1018   Sepsis Labs Invalid input(s): PROCALCITONIN,  WBC,  LACTICIDVEN Microbiology Recent Results (from the past 240 hour(s))  Resp Panel by RT-PCR (Flu A&B, Covid) Nasopharyngeal Swab     Status: None   Collection Time: 07/19/21  6:52 AM   Specimen: Nasopharyngeal Swab; Nasopharyngeal(NP) swabs in vial transport medium  Result Value Ref Range Status   SARS Coronavirus 2 by RT PCR NEGATIVE NEGATIVE Final    Comment: (NOTE) SARS-CoV-2 target nucleic acids are NOT DETECTED.  The SARS-CoV-2 RNA is generally detectable in upper respiratory specimens during the acute phase of infection. The lowest concentration of SARS-CoV-2 viral copies this assay can detect is 138 copies/mL. A negative result does not preclude SARS-Cov-2 infection and should not be used as the sole basis for treatment or other patient management decisions. A negative result may occur with  improper specimen collection/handling, submission of specimen other than nasopharyngeal swab, presence of viral mutation(s) within the areas targeted by this assay, and inadequate number of viral copies(<138 copies/mL). A negative result must be combined with clinical observations, patient history, and epidemiological information. The expected result is Negative.  Fact Sheet for Patients:  EntrepreneurPulse.com.au  Fact Sheet for Healthcare Providers:  IncredibleEmployment.be  This test is no t yet approved or cleared by the [place] FDA and  has been authorized for detection and/or diagnosis of SARS-CoV-2 by FDA under an Emergency Use Authorization (EUA). This EUA will  remain  in effect (meaning this test can be used) for the duration of the COVID-19 declaration under Section 564(b)(1) of the Act, 21 U.S.C.section 360bbb-3(b)(1), unless the authorization is terminated  or revoked sooner.       Influenza A by PCR NEGATIVE NEGATIVE Final   Influenza B by PCR NEGATIVE NEGATIVE Final    Comment: (NOTE) The Xpert Xpress SARS-CoV-2/FLU/RSV plus assay is intended as an aid in the diagnosis of influenza from Nasopharyngeal swab specimens and should not be used as a sole basis for treatment. Nasal washings and aspirates are unacceptable for Xpert Xpress SARS-CoV-2/FLU/RSV testing.  Fact Sheet for Patients: EntrepreneurPulse.com.au  Fact Sheet for Healthcare Providers: IncredibleEmployment.be  This test is not yet approved or cleared by the [place] FDA and has been authorized for detection and/or diagnosis of SARS-CoV-2 by FDA under an Emergency Use Authorization (EUA). This EUA will remain in effect (meaning this test can be used) for the duration of the COVID-19 declaration under Section 564(b)(1) of the Act, 21 U.S.C. section 360bbb-3(b)(1), unless the authorization is terminated or revoked.  Performed at [facility], [place] [address] [place], [place] [address]   MRSA Next Gen by PCR, Nasal     Status: None   Collection Time: 07/19/21  1:35 PM   Specimen: Nasal Mucosa; Nasal Swab  Result Value Ref Range Status   MRSA  by PCR Next Gen NOT DETECTED NOT DETECTED Final    Comment: (NOTE) The GeneXpert MRSA Assay (FDA approved for NASAL specimens only), is one component of a comprehensive MRSA colonization surveillance program. It is not intended to diagnose MRSA infection nor to guide or monitor treatment for MRSA infections. Test performance is not FDA approved in patients less than 46 years old. Performed at [facility], [place] [address], [place], [place] [address]   Resp Panel by RT-PCR (Flu  A&B, Covid) Nasopharyngeal Swab     Status: None   Collection Time: 07/28/21 10:16 AM   Specimen: Nasopharyngeal Swab; Nasopharyngeal(NP) swabs in vial transport medium  Result Value Ref Range Status   SARS Coronavirus 2 by RT PCR NEGATIVE NEGATIVE Final    Comment: (NOTE) SARS-CoV-2 target nucleic acids are NOT DETECTED.  The SARS-CoV-2 RNA is generally detectable in upper respiratory specimens during the acute phase of infection. The lowest concentration of SARS-CoV-2 viral copies this assay can detect is 138 copies/mL. A negative result does not preclude SARS-Cov-2 infection and should not be used as the sole basis for treatment or other patient management decisions. A negative result may occur with  improper specimen collection/handling, submission of specimen other than nasopharyngeal swab, presence of viral mutation(s) within the areas targeted by this assay, and inadequate number of viral copies(<138 copies/mL). A negative result must be combined with clinical observations, patient history, and epidemiological information. The expected result is Negative.  Fact Sheet for Patients:  EntrepreneurPulse.com.au  Fact Sheet for Healthcare Providers:  IncredibleEmployment.be  This test is no t yet approved or cleared by the [place] FDA and  has been authorized for detection and/or diagnosis of SARS-CoV-2 by FDA under an Emergency Use Authorization (EUA). This EUA will remain  in effect (meaning this test can be used) for the duration of the COVID-19 declaration under Section 564(b)(1) of the Act, 21 U.S.C.section 360bbb-3(b)(1), unless the authorization is terminated  or revoked sooner.       Influenza A by PCR NEGATIVE NEGATIVE Final   Influenza B by PCR NEGATIVE NEGATIVE Final    Comment: (NOTE) The Xpert Xpress SARS-CoV-2/FLU/RSV plus assay is intended as an aid in the diagnosis of influenza from Nasopharyngeal swab specimens  and should not be used as a sole basis for treatment. Nasal washings and aspirates are unacceptable for Xpert Xpress SARS-CoV-2/FLU/RSV testing.  Fact Sheet for Patients: EntrepreneurPulse.com.au  Fact Sheet for Healthcare Providers: IncredibleEmployment.be  This test is not yet approved or cleared by the [place] FDA and has been authorized for detection and/or diagnosis of SARS-CoV-2 by FDA under an Emergency Use Authorization (EUA). This EUA will remain in effect (meaning this test can be used) for the duration of the COVID-19 declaration under Section 564(b)(1) of the Act, 21 U.S.C. section 360bbb-3(b)(1), unless the authorization is terminated or revoked.  Performed at [facility], [place] [address], [place], [place] [address]      Time coordinating discharge: Over 30 minutes  SIGNED:   Phillips Climes, MD  [facility] 07/28/2021, 5:20 PM Pager   If 7PM-7AM, please contact night-coverage [email or web address] Password [email or web address]

## 2021-07-28 NOTE — TOC Transition Note (Signed)
Transition of Care [facility]) - CM/SW Discharge Note   Patient Details  Name: Caleb [name] MRN: [id number] Date of Birth: 09-Mar-1925  Transition of Care [facility]) CM/SW Contact:  Benard Halsted, LCSW Phone Number: 07/28/2021, 1:33 PM   Clinical Narrative:    Patient will DC to: [facility] Anticipated DC date: 07/28/21 Family notified: Son, Engineer, technical sales Transport by: Corey Harold   Per MD patient ready for DC to [place]. RN to call report prior to discharge [phone] or [phone]). RN, patient, patient's family, and facility notified of DC. Discharge Summary and FL2 sent to facility. DC packet on chart. Ambulance transport requested for patient.   CSW will sign off for now as social work intervention is no longer needed. Please consult [place] again if new needs arise.     Final next level of care: Skilled Nursing Facility Barriers to Discharge: Barriers Resolved   Patient Goals and CMS Choice     Choice offered to / list presented to : Adult Children  Discharge Placement   Existing PASRR number confirmed : 07/28/21          Patient chooses bed at: [facility] Patient to be transferred to facility by: [facility] Name of family member notified: Bill Patient and family notified of of transfer: 07/28/21  Discharge Plan and Services In-house Referral: Clinical Social Work                                   Social Determinants of Health (SDOH) Interventions     Readmission Risk Interventions Readmission Risk Prevention Plan 07/15/2021 07/14/2021  Post Dischage Appt Complete -  Medication Screening - Complete  Transportation Screening - Complete  Some recent data might be hidden

## 2021-07-28 NOTE — TOC Progression Note (Signed)
Transition of Care [facility]) - Progression Note    Patient Details  Name: RAEDEN PASKINS MRN: [id number] Date of Birth: July 31, 1925  Transition of Care [facility]) CM/SW [place], LCSW Phone Number: 07/28/2021, 9:08 AM  Clinical Narrative:    CSW was able to speak with Freda Munro at [facility]. She will let CSW know when to send patient.    Expected Discharge Plan: [place] Barriers to Discharge: Continued Medical Work up  Expected Discharge Plan and Services Expected Discharge Plan: [place] In-house Referral: Clinical Social Work     Living arrangements for the past 2 months: [place]                                       Social Determinants of Health (SDOH) Interventions    Readmission Risk Interventions Readmission Risk Prevention Plan 07/15/2021 07/14/2021  Post Dischage Appt Complete -  Medication Screening - Complete  Transportation Screening - Complete  Some recent data might be hidden

## 2021-07-28 NOTE — Progress Notes (Signed)
Physical Therapy Treatment Patient Details Name: TANYA MARVIN MRN: [id number] DOB: Dec 15, 1925 Today's Date: 07/28/2021    History of Present Illness 87 y.o. male presenting to ED 7/23 from [facility] with hematemesis, BLE edema, sore throat and lower abdominal pain. Patient admitted with acute blood loss and transient hypotension. Recent hospital admission 7/15-7/19 with rhabdo and UTI after sustaining a fall at home and being on the floor for 6 days. Patient d/c'd to [facility] for rehab. PMHx significant for CAD, HLD, obesity and penile cancer with neurogenic bladder.    PT Comments    Patient received in bed, pleasant and cooperative with therapy today. Does require heavy +2 physical assist for all mobility tasks, and has a difficult time with sitting and standing balance. Sit to stands improved with repeated practice, however he did have a hard time correcting left lean in standing even with 2 person assist. Does seem a bit orthostatic- BP drop to 92/55 from 117/77 after sitting at EOB/in stedy pedals for 5-10 minutes. Left in bed with all needs met, bed alarm active this afternoon. Continue to recommend SNF.    Follow Up Recommendations  SNF     Equipment Recommendations  None recommended by PT    Recommendations for Other Services       Precautions / Restrictions Precautions Precautions: Fall Precaution Comments: watch HR and BP Restrictions Weight Bearing Restrictions: No    Mobility  Bed Mobility Overal bed mobility: Needs Assistance Bed Mobility: Supine to Sit;Sit to Supine     Supine to sit: Max assist;+2 for safety/equipment;+2 for physical assistance;HOB elevated Sit to supine: Max assist;+2 for physical assistance;+2 for safety/equipment   General bed mobility comments: Max A +2 to bring BLE off of bed and for trunk elevation.    Transfers Overall transfer level: Needs assistance Equipment used: Ambulation equipment used Transfers: Sit to/from  Stand Sit to Stand: +2 physical assistance;Mod assist         General transfer comment: Mod A +2 for sit<>stand with stedy, also Modx2 to correct tendency for left lean in standing even with visual feedback for where midline is  Ambulation/Gait             General Gait Details: unable   Stairs             Wheelchair Mobility    Modified Rankin (Stroke Patients Only)       Balance Overall balance assessment: Needs assistance Sitting-balance support: Feet supported;No upper extremity supported Sitting balance-Leahy Scale: Fair Sitting balance - Comments: Min Guard A sitting EOB.   Standing balance support: Bilateral upper extremity supported;During functional activity Standing balance-Leahy Scale: Zero Standing balance comment: Pt requiring mod A to maintain standing balance with stedy                            Cognition Arousal/Alertness: Awake/alert Behavior During Therapy: WFL for tasks assessed/performed Overall Cognitive Status: Impaired/Different from baseline Area of Impairment: Memory;Following commands;Problem solving;Attention                   Current Attention Level: Selective Memory: Decreased short-term memory Following Commands: Follows one step commands consistently;Follows one step commands with increased time     Problem Solving: Slow processing;Requires verbal cues;Difficulty sequencing General Comments: enjoys story-telling. Repeating same joke and asking the same questions multiple times during session. Pt with decreased problem solving requiring verbal step-by-step cues for bed mobility. Pt with selective attention as  indicated by conversating with TV on at beginning of session. Pt requiring increased time to follow commands. Pt with decreased problem solving requiring verbal cues to determine where to put fork to switch to spoon.      Exercises      General Comments General comments (skin integrity, edema, etc.):  pleasant and conversational throughout session. BP 107/66 supine, 117/77 sitting EOB, 92/55 sitting in stedy pedals      Pertinent Vitals/Pain Pain Assessment: Faces Faces Pain Scale: Hurts a little bit Pain Location: generalized pain Pain Descriptors / Indicators: Discomfort;Grimacing Pain Intervention(s): Limited activity within patient's tolerance;Monitored during session;Repositioned    Home Living                      Prior Function            PT Goals (current goals can now be found in the care plan section) Acute Rehab PT Goals Patient Stated Goal: return home after rehab PT Goal Formulation: With patient Time For Goal Achievement: 08/04/21 Potential to Achieve Goals: Good Progress towards PT goals: Progressing toward goals    Frequency    Min 2X/week      PT Plan Current plan remains appropriate    Co-evaluation   Reason for Co-Treatment: For patient/therapist safety;To address functional/ADL transfers PT goals addressed during session: Mobility/safety with mobility;Balance OT goals addressed during session: ADL's and self-care      AM-PAC PT "6 Clicks" Mobility   Outcome Measure  Help needed turning from your back to your side while in a flat bed without using bedrails?: A Lot Help needed moving from lying on your back to sitting on the side of a flat bed without using bedrails?: Total Help needed moving to and from a bed to a chair (including a wheelchair)?: Total Help needed standing up from a chair using your arms (e.g., wheelchair or bedside chair)?: Total Help needed to walk in hospital room?: Total Help needed climbing 3-5 steps with a railing? : Total 6 Click Score: 7    End of Session Equipment Utilized During Treatment: Gait belt Activity Tolerance: Patient tolerated treatment well Patient left: in bed;with call bell/phone within reach;with bed alarm set;with SCD's reapplied Nurse Communication: Mobility status PT Visit Diagnosis:  Unsteadiness on feet (R26.81);Muscle weakness (generalized) (M62.81);Other abnormalities of gait and mobility (R26.89)     Time: 3668-1594 PT Time Calculation (min) (ACUTE ONLY): 33 min  Charges:  $Therapeutic Activity: 8-22 mins (co-tx with OT)                    Windell Norfolk, DPT, PN1   Supplemental Physical Therapist [place]    Pager [phone] Acute Rehab Office [phone]

## 2021-07-28 NOTE — Progress Notes (Signed)
Obtained COVID test on pt.  Spoke with Lab, they did not receive specimen yesterday. New order obtained today and sent for stat for pending d/c to SNF today.

## 2021-07-29 ENCOUNTER — Non-Acute Institutional Stay (SKILLED_NURSING_FACILITY): Payer: Medicare Other | Admitting: Adult Health

## 2021-07-29 ENCOUNTER — Encounter: Payer: Self-pay | Admitting: Adult Health

## 2021-07-29 DIAGNOSIS — I480 Paroxysmal atrial fibrillation: Secondary | ICD-10-CM

## 2021-07-29 DIAGNOSIS — N319 Neuromuscular dysfunction of bladder, unspecified: Secondary | ICD-10-CM | POA: Diagnosis not present

## 2021-07-29 DIAGNOSIS — M7989 Other specified soft tissue disorders: Secondary | ICD-10-CM | POA: Diagnosis not present

## 2021-07-29 DIAGNOSIS — R278 Other lack of coordination: Secondary | ICD-10-CM | POA: Diagnosis not present

## 2021-07-29 DIAGNOSIS — N3000 Acute cystitis without hematuria: Secondary | ICD-10-CM | POA: Diagnosis not present

## 2021-07-29 DIAGNOSIS — I4819 Other persistent atrial fibrillation: Secondary | ICD-10-CM | POA: Diagnosis not present

## 2021-07-29 DIAGNOSIS — Z741 Need for assistance with personal care: Secondary | ICD-10-CM | POA: Diagnosis not present

## 2021-07-29 DIAGNOSIS — K922 Gastrointestinal hemorrhage, unspecified: Secondary | ICD-10-CM | POA: Diagnosis not present

## 2021-07-29 DIAGNOSIS — N179 Acute kidney failure, unspecified: Secondary | ICD-10-CM

## 2021-07-29 DIAGNOSIS — R7989 Other specified abnormal findings of blood chemistry: Secondary | ICD-10-CM | POA: Diagnosis not present

## 2021-07-29 DIAGNOSIS — K921 Melena: Secondary | ICD-10-CM | POA: Diagnosis not present

## 2021-07-29 DIAGNOSIS — I2510 Atherosclerotic heart disease of native coronary artery without angina pectoris: Secondary | ICD-10-CM

## 2021-07-29 DIAGNOSIS — R41841 Cognitive communication deficit: Secondary | ICD-10-CM | POA: Diagnosis not present

## 2021-07-29 DIAGNOSIS — R29818 Other symptoms and signs involving the nervous system: Secondary | ICD-10-CM | POA: Diagnosis not present

## 2021-07-29 DIAGNOSIS — H353211 Exudative age-related macular degeneration, right eye, with active choroidal neovascularization: Secondary | ICD-10-CM | POA: Diagnosis not present

## 2021-07-29 DIAGNOSIS — R58 Hemorrhage, not elsewhere classified: Secondary | ICD-10-CM | POA: Diagnosis not present

## 2021-07-29 DIAGNOSIS — R4189 Other symptoms and signs involving cognitive functions and awareness: Secondary | ICD-10-CM | POA: Diagnosis not present

## 2021-07-29 DIAGNOSIS — M6282 Rhabdomyolysis: Secondary | ICD-10-CM | POA: Diagnosis not present

## 2021-07-29 DIAGNOSIS — K269 Duodenal ulcer, unspecified as acute or chronic, without hemorrhage or perforation: Secondary | ICD-10-CM | POA: Diagnosis not present

## 2021-07-29 DIAGNOSIS — R531 Weakness: Secondary | ICD-10-CM | POA: Diagnosis not present

## 2021-07-29 DIAGNOSIS — M6281 Muscle weakness (generalized): Secondary | ICD-10-CM | POA: Diagnosis not present

## 2021-07-29 DIAGNOSIS — D649 Anemia, unspecified: Secondary | ICD-10-CM | POA: Diagnosis not present

## 2021-07-29 DIAGNOSIS — Z7401 Bed confinement status: Secondary | ICD-10-CM | POA: Diagnosis not present

## 2021-07-29 DIAGNOSIS — K9289 Other specified diseases of the digestive system: Secondary | ICD-10-CM | POA: Diagnosis not present

## 2021-07-29 DIAGNOSIS — K5901 Slow transit constipation: Secondary | ICD-10-CM | POA: Diagnosis not present

## 2021-07-29 NOTE — Progress Notes (Signed)
Left the facility at 0033H with the EMS via stretcher going to [facility] of choice. Alert and oriented x4. Not in distress. No IV access. With indwelling foley catheter for chronic use. No complaint. Stable vital signs.

## 2021-07-29 NOTE — Plan of Care (Signed)
  Problem: Education: Goal: Knowledge of General Education information will improve Description: Including pain rating scale, medication(s)/side effects and non-pharmacologic comfort measures 07/29/2021 0037 by Kaylyn Lim, RN Outcome: Adequate for Discharge 07/28/2021 1944 by Kaylyn Lim, RN Outcome: Adequate for Discharge   Problem: Health Behavior/Discharge Planning: Goal: Ability to manage health-related needs will improve 07/29/2021 0037 by Kaylyn Lim, RN Outcome: Adequate for Discharge 07/28/2021 1944 by Kaylyn Lim, RN Outcome: Adequate for Discharge   Problem: Clinical Measurements: Goal: Ability to maintain clinical measurements within normal limits will improve 07/29/2021 0037 by Kaylyn Lim, RN Outcome: Adequate for Discharge 07/28/2021 1944 by Kaylyn Lim, RN Outcome: Adequate for Discharge Goal: Will remain free from infection 07/29/2021 0037 by Kaylyn Lim, RN Outcome: Adequate for Discharge 07/28/2021 1944 by Kaylyn Lim, RN Outcome: Adequate for Discharge Goal: Diagnostic test results will improve 07/29/2021 0037 by Kaylyn Lim, RN Outcome: Adequate for Discharge 07/28/2021 1944 by Kaylyn Lim, RN Outcome: Adequate for Discharge Goal: Respiratory complications will improve 07/29/2021 0037 by Kaylyn Lim, RN Outcome: Adequate for Discharge 07/28/2021 1944 by Kaylyn Lim, RN Outcome: Adequate for Discharge Goal: Cardiovascular complication will be avoided 07/29/2021 0037 by Kaylyn Lim, RN Outcome: Adequate for Discharge 07/28/2021 1944 by Kaylyn Lim, RN Outcome: Adequate for Discharge   Problem: Activity: Goal: Risk for activity intolerance will decrease 07/29/2021 0037 by Kaylyn Lim, RN Outcome: Adequate for Discharge 07/28/2021 1944 by Kaylyn Lim, RN Outcome: Adequate for Discharge   Problem: Nutrition: Goal: Adequate nutrition will be maintained 07/29/2021 0037 by Kaylyn Lim, RN Outcome: Adequate for Discharge 07/28/2021 1944 by Kaylyn Lim, RN Outcome:  Adequate for Discharge   Problem: Coping: Goal: Level of anxiety will decrease 07/29/2021 0037 by Kaylyn Lim, RN Outcome: Adequate for Discharge 07/28/2021 1944 by Kaylyn Lim, RN Outcome: Adequate for Discharge   Problem: Elimination: Goal: Will not experience complications related to bowel motility 07/29/2021 0037 by Kaylyn Lim, RN Outcome: Adequate for Discharge 07/28/2021 1944 by Kaylyn Lim, RN Outcome: Adequate for Discharge Goal: Will not experience complications related to urinary retention 07/29/2021 0037 by Kaylyn Lim, RN Outcome: Adequate for Discharge 07/28/2021 1944 by Kaylyn Lim, RN Outcome: Adequate for Discharge   Problem: Safety: Goal: Ability to remain free from injury will improve 07/29/2021 0037 by Kaylyn Lim, RN Outcome: Adequate for Discharge 07/28/2021 1944 by Kaylyn Lim, RN Outcome: Adequate for Discharge   Problem: Pain Managment: Goal: General experience of comfort will improve 07/29/2021 0037 by Kaylyn Lim, RN Outcome: Adequate for Discharge 07/28/2021 1944 by Kaylyn Lim, RN Outcome: Adequate for Discharge   Problem: Skin Integrity: Goal: Risk for impaired skin integrity will decrease 07/29/2021 0037 by Kaylyn Lim, RN Outcome: Adequate for Discharge 07/28/2021 1944 by Kaylyn Lim, RN Outcome: Adequate for Discharge

## 2021-07-29 NOTE — Progress Notes (Addendum)
Location:  [place] Room Number: [id number] Place of Service:  SNF (31) Provider:  Durenda Age, DNP, FNP-BC  Patient Care Team: Sharilyn Sites, MD as PCP - General (Family Medicine)  Extended Emergency Contact Information Primary Emergency Contact: Natale Lay, [place] Phone: [phone] Mobile Phone: [phone] Relation: Son Secondary Emergency Contact: [facility] Phone: [phone] Relation: Son  Code Status: Full code   Goals of care: Advanced Directive information Advanced Directives 07/29/2021  Does Patient Have a Medical Advance Directive? No  Type of Advance Directive -  Does patient want to make changes to medical advance directive? -  Copy of [place] in Chart? -  Would patient like information on creating a medical advance directive? No - Patient declined     Chief Complaint  Patient presents with   Hospitalization Follow-up    HPI:  Pt is a 87 y.o. male who was admitted to [facility] on 07/28/21 post hospital admission 07/19/21 to 07/28/21.  He has a PMH of CAD and currently on Plavix. Of note, he was having a short-term rehabilitation at [facility] post hospitalization 07/11/2021 to 07/15/2021 for S/P fall at home and was found to have atrial fibrillation with RVR.  He complained of nausea and vomiting and noticed some bloody emesis or secretions in the mouth.  He was noted to have dark stools.  EMS was called.  Heart rates up to 130s, atrial fibrillation with RVR.  GI was consulted and recommended ICU admission.  He was noted to have a couple points from recent discharge.  He continued to have melenic stools in the ED. Endoscopy was done on 7/26 which was significant for multiple duodenal ulcers in the setting of DAPT. He had a total of 7 units PRBC transfusion and IV PPI. He endorses heavy Ibuprofen use secondary to arthritis. He will need to  continue Protonix 40 mg BID X 8 weeks, then once daily.  When his blood pressure was low, his metoprolol has been held and was started on oral amiodarone initially.  Now his BP has improved, he was transitioned to metoprolol at a lower dose 12.5 mg BID. He is not an anticoagulation candidate given his current GI bleed with multiple transfusions and history of falls. Plavix was discontinued and was started on ASA  He was seen in his room today. He denies having pain. Speech therapist performed SLUMS for which he scored 22/30, ranging in minimal cognitive deficit.   Past Medical History:  Diagnosis Date   Coronary artery disease    Hyperlipidemia    Obesity    Penile cancer ([place])    Swelling of extremity    Left Leg   Past Surgical History:  Procedure Laterality Date   APPENDECTOMY     BACK SURGERY     BIOPSY  07/22/2021   Procedure: BIOPSY;  Surgeon: Jackquline Denmark, MD;  Location: [facility] ENDOSCOPY;  Service: Gastroenterology;;   CHOLECYSTECTOMY     CORONARY ANGIOPLASTY  11/2005   RCA PCI AND STENTING WITH A CYPHER DES   CORONARY ARTERY BYPASS GRAFT     ESOPHAGOGASTRODUODENOSCOPY (EGD) WITH PROPOFOL N/A 07/22/2021   Procedure: ESOPHAGOGASTRODUODENOSCOPY (EGD) WITH PROPOFOL;  Surgeon: Jackquline Denmark, MD;  Location: [facility];  Service: Gastroenterology;  Laterality: N/A;   HIATAL HERNIA REPAIR     lymph node removal     NM MYOCAR MULTIPLE W/SPECT  11/26/2009   EF 62%.  NORMAL MYOCARDIAL PERFUSION STUDY.   TRANSTHORACIC ECHOCARDIOGRAM  12/22/2005   MILD TO MOD AORTIC SCLEROSIS W/O STENOSIS. MILD TO MODERATE MITRAL CALCIFICATION. LA- MILDLY DILATED.    No Known Allergies  Outpatient Encounter Medications as of 07/29/2021  Medication Sig   acetaminophen (TYLENOL) 325 MG tablet Take 2 tablets (650 mg total) by mouth every 6 (six) hours as needed for mild pain (or Fever >/= 101).   aspirin EC 81 MG tablet Take 81 mg by mouth daily.   bisacodyl (DULCOLAX) 10 MG suppository If not relieved by  MOM, give 10 mg Bisacodyl suppositiory rectally X 1 dose in 24 hours as needed   Calcium Carbonate-Vitamin D (CALCIUM-D PO) Take 1 tablet by mouth daily.   furosemide (LASIX) 20 MG tablet Take 1 tablet (20 mg total) by mouth daily.   lactulose (CHRONULAC) 10 GM/15ML solution Take 10 g by mouth 2 (two) times daily as needed for mild constipation.   Magnesium Hydroxide (MILK OF MAGNESIA PO) Take by mouth. If no BM in 3 days, give 30 cc Milk of Magnesium p.o. x 1 dose in 24 hours as needed (Do not use standing constipation orders for residents with renal failure CFR less than 30. Contact MD for orders)   metoprolol tartrate (LOPRESSOR) 25 MG tablet Take 0.5 tablets (12.5 mg total) by mouth 2 (two) times daily.   Multiple Vitamin (MULTIVITAMIN WITH MINERALS) TABS tablet Take 1 tablet by mouth daily. Centrum Silver   Multiple Vitamins-Minerals (PRESERVISION AREDS 2) CAPS Take by mouth.   NON FORMULARY Diet:Heart healthy   pantoprazole (PROTONIX) 40 MG tablet Take 40 mg by mouth daily. Give one tablet by mouth twice daily for Upper GI Bleed x 8wks   polyethylene glycol powder (GLYCOLAX/MIRALAX) powder Take 17 g by mouth daily as needed for mild constipation or moderate constipation.   simvastatin (ZOCOR) 20 MG tablet TAKE 1 TABLET AT BEDTIME   Sodium Phosphates (RA SALINE ENEMA RE) Place rectally. If not relieved by Biscodyl suppository, give disposable Saline Enema rectally X 1 dose/24 hrs as needed   [DISCONTINUED] pantoprazole (PROTONIX) 40 MG tablet Take 1 tablet (40 mg total) by mouth 2 (two) times daily.   No facility-administered encounter medications on file as of 07/29/2021.    Review of Systems  GENERAL: No change in appetite, no fatigue, no weight changes, no fever or chills  MOUTH and THROAT: Denies oral discomfort, gingival pain or bleeding RESPIRATORY: no cough, SOB, DOE, wheezing, hemoptysis CARDIAC: No chest pain, edema or palpitations GI: No abdominal pain, diarrhea, constipation,  heart burn, nausea or vomiting GU: Denies dysuria, frequency, hematuria or discharge NEUROLOGICAL: Denies dizziness, syncope, numbness, or headache PSYCHIATRIC: Denies feelings of depression or anxiety. No report of hallucinations, insomnia, paranoia, or agitation   Immunization History  Administered Date(s) Administered   H1N1 10/30/2015   Influenza, High Dose Seasonal PF 10/16/2020   Influenza-Unspecified 10/28/2014, 09/27/2018   Moderna Sars-Covid-2 Vaccination 01/09/2020, 02/09/2020, 12/12/2020   Pneumococcal Polysaccharide-23 11/15/1995, 10/19/2006   Tdap 01/12/2016   Pertinent  Health Maintenance Due  Topic Date Due   PNA vac Low Risk Adult (2 of 2 - PCV13) 10/20/2007   INFLUENZA VACCINE  07/28/2021   No flowsheet data found.   Vitals:   07/29/21 1323  BP: 102/61  Pulse: 95  Resp: 18  Temp: 98.1 F (36.7 C)  Weight: 246 lb (111.6 kg)  Height: '5\' 10"'$  (1.778 m)   Body mass index is 35.3 kg/m.  Physical Exam  GENERAL APPEARANCE: Well nourished. In  no acute distress. Obese. MOUTH and THROAT: Lips are without lesions. Oral mucosa is moist and without lesions.  RESPIRATORY: Breathing is even & unlabored, BS CTAB CARDIAC: RRR, no murmur,no extra heart sounds, no edema GI: Abdomen soft, normal BS, no masses, no tenderness GU:  Has foley catheter, French 16 NEUROLOGICAL: There is no tremor. Speech is clear. Alert and oriented X 3. PSYCHIATRIC:  Affect and behavior are appropriate  Labs reviewed: Recent Labs    07/14/21 0523 07/15/21 0441 07/19/21 0603 07/22/21 0400 07/23/21 0614 07/26/21 0159 07/27/21 0014 07/28/21 0104  NA 136 135   < > 136   < > 134* 136 137  K 4.5 4.5   < > 4.5   < > 4.3 4.4 4.2  CL 108 108   < > 106   < > 105 104 103  CO2 23 22   < > 23   < > '24 25 27  '$ GLUCOSE 99 100*   < > 106*   < > 104* 119* 103*  BUN 35* 32*   < > 73*   < > 35* 32* 30*  CREATININE 1.23 1.22   < > 1.66*   < > 1.64* 1.59* 1.70*  CALCIUM 8.2* 8.0*   < > 8.0*   < >  8.0* 8.1* 8.1*  MG 2.1 1.9  --  2.3  --   --   --   --    < > = values in this interval not displayed.   Recent Labs    07/15/21 0441 07/19/21 0603 07/20/21 0017  AST 45* 62* 56*  ALT 55* 82* 74*  ALKPHOS 38 43 36*  BILITOT 0.5 0.7 0.7  PROT 4.9* 5.1* 4.5*  ALBUMIN 2.3* 2.3* 2.2*   Recent Labs    07/19/21 0603 07/19/21 0643 07/20/21 0017 07/20/21 0703 07/20/21 1053 07/26/21 0159 07/27/21 0014 07/28/21 0104  WBC 14.7*  --  9.3 11.7*   < > 9.7 6.9 6.0  NEUTROABS 12.5*  --  6.8 9.0*  --   --   --   --   HGB 7.8*   < > 8.3* 8.8*   < > 8.5* 8.7* 8.7*  HCT 25.4*   < > 23.7* 25.3*   < > 25.6* 26.3* 26.8*  MCV 103.7*  --  90.1 92.3   < > 93.8 94.9 95.4  PLT 223  --  133* 159   < > 149* 165 196   < > = values in this interval not displayed.   Lab Results  Component Value Date   TSH 1.278 07/11/2021   No results found for: HGBA1C Lab Results  Component Value Date   CHOL 121 06/15/2014   HDL 42 06/15/2014   LDLCALC 67 06/15/2014   TRIG 59 06/15/2014   CHOLHDL 2.9 06/15/2014    Significant Diagnostic Results in last 30 days:  DG Chest 1 View  Result Date: 07/11/2021 CLINICAL DATA:  Fall 6 days ago.  Chest and bilateral hip pain. EXAM: CHEST  1 VIEW COMPARISON:  Radiographs 01/14/2016 and 01/13/2016.  CT 12/20/2005. FINDINGS: 1317 hours. The heart size and mediastinal contours are stable with aortic atherosclerosis. The lungs appear clear. There is no pleural effusion or pneumothorax. There are old left-sided rib fractures and sequela of prior left AC joint injury with heterotopic ossification. No acute osseous findings evident. Telemetry leads overlie the chest. IMPRESSION: No acute cardiopulmonary process. Sequela of previous upper left chest trauma. Electronically Signed   By: Caryl Comes.D.  On: 07/11/2021 13:54   CT Head Wo Contrast  Result Date: 07/11/2021 CLINICAL DATA:  Fall, head trauma EXAM: CT HEAD WITHOUT CONTRAST TECHNIQUE: Contiguous axial images were  obtained from the base of the skull through the vertex without intravenous contrast. COMPARISON:  CT head 01/12/2016 FINDINGS: Brain: No acute intracranial hemorrhage. No focal mass lesion. No CT evidence of acute infarction. No midline shift or mass effect. No hydrocephalus. Basilar cisterns are patent. There are periventricular and subcortical white matter hypodensities. Generalized cortical atrophy. Vascular: No hyperdense vessel or unexpected calcification. Skull: Normal. Negative for fracture or focal lesion. Sinuses/Orbits: Paranasal sinuses and mastoid air cells are clear. Orbits are clear. Other: None. IMPRESSION: 1. No intracranial trauma. 2. Advanced atrophy and white matter microvascular disease. Electronically Signed   By: Suzy Bouchard M.D.   On: 07/11/2021 14:38   DG CHEST PORT 1 VIEW  Result Date: 07/19/2021 CLINICAL DATA:  Hemoptysis yesterday EXAM: PORTABLE CHEST 1 VIEW COMPARISON:  07/11/2021 FINDINGS: Normal heart size and mediastinal contours. Low volume chest. No acute infiltrate or edema. No effusion or pneumothorax. No acute osseous findings. Artifact from EKG leads. IMPRESSION: Stable exam.  No acute finding. Electronically Signed   By: Monte Fantasia M.D.   On: 07/19/2021 09:03   ECHOCARDIOGRAM COMPLETE  Result Date: 07/12/2021    ECHOCARDIOGRAM REPORT   Patient Name:   ROCKNEY BALTHAZOR Date of Exam: 07/12/2021 Medical Rec #:  [id number]       Height:       70.0 in Accession #:    [id number]      Weight:       237.0 lb Date of Birth:  [birth year]        BSA:          2.243 m Patient Age:    44 years        BP:           107/64 mmHg Patient Gender: M               HR:           112 bpm. Exam Location:  [facility] Procedure: 2D Echo, Cardiac Doppler and Color Doppler Indications:    I48.0 Paroxysmal atrial fibrillation  History:        Patient has prior history of Echocardiogram examinations, most                 recent 12/22/2005. CAD; Risk Factors:Hypertension and                  Dyslipidemia. Cancer.  Sonographer:    Jonelle Sidle Dance Referring Phys: [id number] [address] [place]  1. Left ventricular ejection fraction, by estimation, is 65 to 70%. The left ventricle has hyperdynamic function. The left ventricle has no regional wall motion abnormalities. There is mild concentric left ventricular hypertrophy. Left ventricular diastolic function could not be evaluated.  2. Right ventricular systolic function is normal. The right ventricular size is normal. There is normal pulmonary artery systolic pressure.  3. Left atrial size was moderately dilated.  4. Right atrial size was mildly dilated.  5. The mitral valve is normal in structure. No evidence of mitral valve regurgitation.  6. The aortic valve is tricuspid. There is moderate calcification of the aortic valve. There is moderate thickening of the aortic valve. Aortic valve regurgitation is not visualized. Mild to moderate aortic valve stenosis.  7. Aortic dilatation noted. There is mild dilatation of the aortic root, measuring 40 mm.  There is mild dilatation of the ascending aorta, measuring 39 mm.  8. The inferior vena cava is normal in size with greater than 50% respiratory variability, suggesting right atrial pressure of 3 mmHg. FINDINGS  Left Ventricle: Left ventricular ejection fraction, by estimation, is 65 to 70%. The left ventricle has hyperdynamic function. The left ventricle has no regional wall motion abnormalities. The left ventricular internal cavity size was normal in size. There is mild concentric left ventricular hypertrophy. Left ventricular diastolic function could not be evaluated due to atrial fibrillation. Left ventricular diastolic function could not be evaluated. Right Ventricle: The right ventricular size is normal. No increase in right ventricular wall thickness. Right ventricular systolic function is normal. There is normal pulmonary artery systolic pressure. The tricuspid regurgitant velocity is 2.72 m/s, and   with an assumed right atrial pressure of 3 mmHg, the estimated right ventricular systolic pressure is [id number] mmHg. Left Atrium: Left atrial size was moderately dilated. Right Atrium: Right atrial size was mildly dilated. Pericardium: There is no evidence of pericardial effusion. Mitral Valve: The mitral valve is normal in structure. Mild to moderate mitral annular calcification. No evidence of mitral valve regurgitation. Tricuspid Valve: The tricuspid valve is normal in structure. Tricuspid valve regurgitation is trivial. Aortic Valve: The aortic valve is tricuspid. There is moderate calcification of the aortic valve. There is moderate thickening of the aortic valve. Aortic valve regurgitation is not visualized. Mild to moderate aortic stenosis is present. Aortic valve mean gradient measures 14.8 mmHg. Aortic valve peak gradient measures 28.6 mmHg. Aortic valve area, by VTI measures 1.35 cm. Pulmonic Valve: The pulmonic valve was not well visualized. Pulmonic valve regurgitation is not visualized. Aorta: Aortic dilatation noted. There is mild dilatation of the aortic root, measuring 40 mm. There is mild dilatation of the ascending aorta, measuring 39 mm. Venous: The inferior vena cava is normal in size with greater than 50% respiratory variability, suggesting right atrial pressure of 3 mmHg. IAS/Shunts: No atrial level shunt detected by color flow Doppler.  LEFT VENTRICLE PLAX 2D LVIDd:         3.08 cm LVIDs:         2.98 cm LV PW:         1.36 cm LV IVS:        1.24 cm LVOT diam:     2.10 cm LV SV:         57 LV SV Index:   26 LVOT Area:     3.46 cm  RIGHT VENTRICLE RV Basal diam:  3.21 cm TAPSE (M-mode): 1.7 cm LEFT ATRIUM             Index       RIGHT ATRIUM           Index LA diam:        4.60 cm 2.05 cm/m  RA Area:     16.30 cm LA Vol (A2C):   74.8 ml 33.34 ml/m RA Volume:   36.70 ml  16.36 ml/m LA Vol (A4C):   73.9 ml 32.94 ml/m LA Biplane Vol: 79.1 ml 35.26 ml/m  AORTIC VALVE AV Area (Vmax):    1.26 cm  AV Area (Vmean):   1.29 cm AV Area (VTI):     1.35 cm AV Vmax:           267.50 cm/s AV Vmean:          178.500 cm/s AV VTI:  0.427 m AV Peak Grad:      28.6 mmHg AV Mean Grad:      14.8 mmHg LVOT Vmax:         97.55 cm/s LVOT Vmean:        66.650 cm/s LVOT VTI:          0.166 m LVOT/AV VTI ratio: 0.39  AORTA Ao Root diam: 4.00 cm Ao Asc diam:  3.90 cm MITRAL VALVE               TRICUSPID VALVE MV Area (PHT): 2.81 cm    TR Peak grad:   29.6 mmHg MV Decel Time: 270 msec    TR Vmax:        272.00 cm/s MV E velocity: 99.90 cm/s                            SHUNTS                            Systemic VTI:  0.17 m                            Systemic Diam: 2.10 cm Dani Gobble Croitoru MD Electronically signed by Sanda Klein MD Signature Date/Time: 07/12/2021/5:32:23 PM    Final    DG Hips Bilat W or Wo Pelvis 3-4 Views  Result Date: 07/11/2021 CLINICAL DATA:  Bilateral hip pain since falling 6 days ago. EXAM: DG HIP (WITH OR WITHOUT PELVIS) 3-4V BILAT COMPARISON:  Radiographs 05/09/2015. FINDINGS: The bones appear demineralized. There is no evidence of acute fracture, dislocation or bone destruction. The hip joint spaces are preserved. There are mild degenerative changes in the lower lumbar spine and sacroiliac joints. There are surgical clips in both groins and scattered vascular calcifications. IMPRESSION: No evidence of acute fracture, dislocation or bone destruction. Scattered vascular calcifications. Electronically Signed   By: Richardean Sale M.D.   On: 07/11/2021 13:56    Assessment/Plan  1. Upper GI bleed -Endoscopy done on 7/26 showed multiple duodenal ulcers in the setting of DAPT and ibuprofen use secondary to arthritis -  was transfused 7 units of PRBC -   will continue Protonix 40 mg twice daily x8 weeks, then once daily indefinitely  2. PAF (paroxysmal atrial fibrillation) (HCC) -   not an anticoagulation candidate given his current GI bleed with multiple transfusions and history of  falls -   was started on ASA EC 81 mg daily  3. Coronary artery disease involving native coronary artery of native heart without angina pectoris -  S/P PCI to RCA in 2006 -  Plavix was discontinued and re-started on ASA EC 81 mg daily -   continue Simvastatin 20 mg daily  4. AKI (acute kidney injury) [facility]) Lab Results  Component Value Date   NA 137 07/28/2021   K 4.2 07/28/2021   CO2 27 07/28/2021   GLUCOSE 103 (H) 07/28/2021   BUN 30 (H) 07/28/2021   CREATININE 1.70 (H) 07/28/2021   CALCIUM 8.1 (L) 07/28/2021   GFRNONAA 37 (L) 07/28/2021   GFRAA 48 (L) 03/17/2016   -   creatinine was 2.1 on hospital admission   Family/ staff Communication: Discussed plan of care with resident and charge nurse.  Labs/tests ordered:   CBC  Goals of care:   Short-term care   Durenda Age, DNP, MSN, FNP-BC [facility] [phone] (Monday-Friday 8:00 a.m. - 5:00 p.m.) [phone] (after hours)

## 2021-07-31 ENCOUNTER — Encounter: Payer: Self-pay | Admitting: Internal Medicine

## 2021-07-31 ENCOUNTER — Non-Acute Institutional Stay (SKILLED_NURSING_FACILITY): Payer: Medicare Other | Admitting: Internal Medicine

## 2021-07-31 DIAGNOSIS — R4189 Other symptoms and signs involving cognitive functions and awareness: Secondary | ICD-10-CM

## 2021-07-31 DIAGNOSIS — N179 Acute kidney failure, unspecified: Secondary | ICD-10-CM

## 2021-07-31 DIAGNOSIS — M7989 Other specified soft tissue disorders: Secondary | ICD-10-CM

## 2021-07-31 DIAGNOSIS — I4819 Other persistent atrial fibrillation: Secondary | ICD-10-CM

## 2021-07-31 DIAGNOSIS — K269 Duodenal ulcer, unspecified as acute or chronic, without hemorrhage or perforation: Secondary | ICD-10-CM

## 2021-07-31 DIAGNOSIS — K921 Melena: Secondary | ICD-10-CM

## 2021-07-31 DIAGNOSIS — R29818 Other symptoms and signs involving the nervous system: Secondary | ICD-10-CM

## 2021-07-31 DIAGNOSIS — Z8719 Personal history of other diseases of the digestive system: Secondary | ICD-10-CM | POA: Insufficient documentation

## 2021-07-31 HISTORY — DX: Acute kidney failure, unspecified: N17.9

## 2021-07-31 NOTE — Assessment & Plan Note (Addendum)
07/31/2021 the right lower extremity below the knee appears larger than the left.  There is equivocal tenderness to compression in this area.  D-dimer & venous Doppler will be checked.  He would be at extremely high risk with anticoagulation in view of the recent severe anemia in the context of bleeding duodenal ulcers

## 2021-07-31 NOTE — Assessment & Plan Note (Addendum)
CKD Stage 3b Medication List reviewed; no nephrotoxic agents identified.

## 2021-07-31 NOTE — Assessment & Plan Note (Addendum)
Rate controlled Not anticoagulation candidate because of history of recent severe GI bleeding and risk of falls.

## 2021-07-31 NOTE — Progress Notes (Signed)
NURSING HOME LOCATION:  [facility] ROOM NUMBER:  [id number]  CODE STATUS:  Full Code  PCP:  Sharilyn Sites MD  This is a comprehensive admission note to this SNF performed on this date less than 30 days from date of admission. Included are preadmission medical/surgical history; reconciled medication list; family history; social history and comprehensive review of systems.  Corrections and additions to the records were documented. Comprehensive physical exam was also performed. Additionally a clinical summary was entered for each active diagnosis pertinent to this admission in the Problem List to enhance continuity of care.  HPI: He was hospitalized 7/23 - 07/28/2021 brought from a nursing facility because of visualized intraoral bleeding and melanotic stools.  This is in the context of DAPT and reported regular ibuprofen use for arthritic symptoms.  At presentation he exhibited tachycardia with heart rates into the 130s.  EKG revealed A. fib with RVR.  In the ED blood pressure was soft prompting initiation of 2 L of crystalloid.  Metoprolol was held.  Amiodarone was initiated.  Hemoglobin was down "a couple of points" from recent discharge prompting administration of 1 unit of packed red cells .  GI consulted and requested ICU placement in case of decompensation requiring emergent endoscopy. The hemoglobin stabilized after a total of 3 units of PRBCs and IV PPI.  Because of intermittent hypotension he received another unit of PRBCs on 7/25.  Overnight 7/25-7/26 hemoglobin dropped to 6.  EGD 7/26  revealed multiple duodenal ulcers. In total the patient received 7 units of PRBCs.  At discharge hemoglobin was 8.7.  Hypotension had resolved; but at discharge metoprolol was decreased to 12.5 mg twice daily.  Anticoagulation was not pursued for the PAF because of the current GI bleed requiring multiple transfusions as well as a history of falls. Aspirin was reinitiated as prophylaxis.  Plavix was discontinued. The risk of NSAIDs with the DAPT was discussed in detail with the patient. He was to continue Protonix 40 mg twice daily for 8 weeks then once daily indefinitely. Hospital course was complicated by AKI with initial creatinine of 2.1; it was 1.7 prior to D/C.  Past medical and surgical history: Includes history of CAD, dyslipidemia, history of penile cancer, Surgeries and procedures include CABG, EGD, hiatal hernia repair, back surgery, essential HTN,and cholecystectomy.  Social history: Nondrinker; former smoker.  Family history: Reviewed, noncontributory due to advanced age.   Review of systems: He seems to be a good historian.  He contradicts the medical records which stated he was ingesting ibuprofen.  He states that he only takes Tylenol. When I asked why he had been admitted to the hospital;he responded "I threw up blood".  When asked the etiology his response was "ulcers".  He denies any GI symptoms at this time or bleeding dyscrasias.  Constitutional: No fever, significant weight change, fatigue  Eyes: No redness, discharge, pain, vision change ENT/mouth: No nasal congestion, purulent discharge, earache, change in hearing, sore throat  Cardiovascular: No chest pain, palpitations, paroxysmal nocturnal dyspnea, claudication, edema  Respiratory: No cough, sputum production, hemoptysis, DOE, significant snoring, apnea Gastrointestinal: No heartburn, dysphagia, abdominal pain, nausea /vomiting, rectal bleeding, melena, change in bowels Genitourinary: No dysuria, hematuria, pyuria, incontinence, nocturia Musculoskeletal: No joint stiffness, joint swelling, weakness, pain Dermatologic: No rash, pruritus, change in appearance of skin Neurologic: No dizziness, headache, syncope, seizures, numbness, tingling Psychiatric: No significant anxiety, depression, insomnia, anorexia Endocrine: No change in hair/skin/nails, excessive thirst, excessive hunger, excessive urination   Hematologic/lymphatic: No  significant bruising, lymphadenopathy, abnormal bleeding Allergy/immunology: No itchy/watery eyes, significant sneezing, urticaria, angioedema  Physical exam:  Pertinent or positive findings: He appears much younger than his stated age.  He is Geneticist, molecular.  He has an irregular rhythm with a flow murmur.  Low-grade rales are noted posteriorly in a diffuse pattern.  Abdomen is protuberant.  He has 1+ edema of the lower extremities.  Pedal pulses are decreased.  He has bruising over the dorsum of the hands.  There is extensive bruising over the left forearm with an area of hypopigmented scarring.  He has hyperpigmented, sclerotic changes of the skin over the shins. The right lower extremity appears larger than the left.  He has equivocal tenderness to compression of the right calf without a definitive positive Bevelyn Buckles' sign.  General appearance: Adequately nourished; no acute distress, increased work of breathing is present.   Lymphatic: No lymphadenopathy about the head, neck, axilla. Eyes: No conjunctival inflammation or lid edema is present. There is no scleral icterus. Ears:  External ear exam shows no significant lesions or deformities.   Nose:  External nasal examination shows no deformity or inflammation. Nasal mucosa are pink and moist without lesions, exudates Oral exam: Lips and gums are healthy appearing.There is no oropharyngeal erythema or exudate. Neck:  No thyromegaly, masses, tenderness noted.    Heart:  No gallop, murmur, click, rub.  Lungs:  without wheezes, rhonchi,  rubs. Abdomen: Bowel sounds are normal.  Abdomen is soft and nontender with no organomegaly, hernias, masses. GU: Deferred  Extremities:  No cyanosis, clubbing. Neurologic exam: Balance, Rhomberg, finger to nose testing could not be completed due to clinical state Skin: Warm & dry w/o tenting. No significant rash.  See clinical summary under each active problem in the Problem List with  associated updated therapeutic plan

## 2021-07-31 NOTE — Assessment & Plan Note (Signed)
No active GI symptoms or bleeding dyscrasias present.

## 2021-07-31 NOTE — Patient Instructions (Signed)
See assessment and plan under each diagnosis in the problem list and acutely for this visit 

## 2021-08-01 ENCOUNTER — Non-Acute Institutional Stay (SKILLED_NURSING_FACILITY): Payer: Medicare Other | Admitting: Adult Health

## 2021-08-01 DIAGNOSIS — K921 Melena: Secondary | ICD-10-CM | POA: Diagnosis not present

## 2021-08-01 DIAGNOSIS — R7989 Other specified abnormal findings of blood chemistry: Secondary | ICD-10-CM | POA: Diagnosis not present

## 2021-08-01 DIAGNOSIS — I480 Paroxysmal atrial fibrillation: Secondary | ICD-10-CM | POA: Diagnosis not present

## 2021-08-01 LAB — CBC AND DIFFERENTIAL
HCT: 32 — AB (ref 41–53)
Hemoglobin: 10.5 — AB (ref 13.5–17.5)
Platelets: 220 (ref 150–399)
WBC: 5.9

## 2021-08-01 LAB — CBC: RBC: 3.47 — AB (ref 3.87–5.11)

## 2021-08-01 NOTE — Progress Notes (Signed)
Location:  [facility] Room Number: [id number] A Place of Service:  SNF (31) Provider:  Durenda Age, DNP, FNP-BC  Patient Care Team: Sharilyn Sites, MD as PCP - General (Family Medicine)  Extended Emergency Contact Information Primary Emergency Contact: Natale Lay, [place] Phone: [phone] Mobile Phone: [phone] Relation: Son Secondary Emergency Contact: [facility] Phone: [phone] Relation: Son  Code Status:  FULL CODE  Goals of care: Advanced Directive information Advanced Directives 08/01/2021  Does Patient Have a Medical Advance Directive? No  Type of Advance Directive -  Does patient want to make changes to medical advance directive? -  Copy of [place] in Chart? -  Would patient like information on creating a medical advance directive? -     Chief Complaint  Patient presents with   Acute Visit    Elevated D-dimer    HPI:  Pt is a 87 y.o. male seen today for an acute visit due to elevated D-dimer. He is currently having a short-term rehabilitation at [facility]. Right lower extremity has been noted to have 2+edema. Right pedal pulse +2. D-dimer obtained 13.29, elevated. He had a recent upper GI bleed with endoscopy, done on 7/26, showed multiple duodenal ulcers in the setting of DAPT and ibuprofen use secondary to arthritis.  He had transfusion of 7 units of PRBC.  He is currently taking Protonix 40 mg twice a day for GI bleed.   Past Medical History:  Diagnosis Date   Coronary artery disease    Hyperlipidemia    Obesity    Penile cancer ([place])    Swelling of extremity    Left Leg   Past Surgical History:  Procedure Laterality Date   APPENDECTOMY     BACK SURGERY     BIOPSY  07/22/2021   Procedure: BIOPSY;  Surgeon: Jackquline Denmark, MD;  Location[facility];  Service: Gastroenterology;;   CHOLECYSTECTOMY     CORONARY  ANGIOPLASTY  11/2005   RCA PCI AND STENTING WITH A CYPHER DES   CORONARY ARTERY BYPASS GRAFT     ESOPHAGOGASTRODUODENOSCOPY (EGD) WITH PROPOFOL N/A 07/22/2021   Procedure: ESOPHAGOGASTRODUODENOSCOPY (EGD) WITH PROPOFOL;  Surgeon: Jackquline Denmark, MD;  Location: [facility];  Service: Gastroenterology;  Laterality: N/A;   HIATAL HERNIA REPAIR     lymph node removal     NM MYOCAR MULTIPLE W/SPECT  11/26/2009   EF 62%. NORMAL MYOCARDIAL PERFUSION STUDY.   TRANSTHORACIC ECHOCARDIOGRAM  12/22/2005   MILD TO MOD AORTIC SCLEROSIS W/O STENOSIS. MILD TO MODERATE MITRAL CALCIFICATION. LA- MILDLY DILATED.    No Known Allergies  Outpatient Encounter Medications as of 08/01/2021  Medication Sig   acetaminophen (TYLENOL) 325 MG tablet Take 2 tablets (650 mg total) by mouth every 6 (six) hours as needed for mild pain (or Fever >/= 101).   aspirin EC 81 MG tablet Take 81 mg by mouth daily.   bisacodyl (DULCOLAX) 10 MG suppository If not relieved by MOM, give 10 mg Bisacodyl suppositiory rectally X 1 dose in 24 hours as needed   Calcium Carbonate-Vitamin D (CALCIUM-D PO) Take 1 tablet by mouth daily.   furosemide (LASIX) 20 MG tablet Take 1 tablet (20 mg total) by mouth daily.   lactulose (CHRONULAC) 10 GM/15ML solution Take 10 g by mouth 2 (two) times daily as needed for mild constipation.   Magnesium Hydroxide (MILK OF MAGNESIA PO) Take by mouth. If no BM  in 3 days, give 30 cc Milk of Magnesium p.o. x 1 dose in 24 hours as needed (Do not use standing constipation orders for residents with renal failure CFR less than 30. Contact MD for orders)   metoprolol tartrate (LOPRESSOR) 25 MG tablet Take 0.5 tablets (12.5 mg total) by mouth 2 (two) times daily.   Multiple Vitamin (MULTIVITAMIN WITH MINERALS) TABS tablet Take 1 tablet by mouth daily. Centrum Silver   Multiple Vitamins-Minerals (PRESERVISION AREDS 2) CAPS Take by mouth.   NON FORMULARY Diet:Heart healthy   pantoprazole (PROTONIX) 40 MG tablet Take 40 mg  by mouth daily. Give one tablet by mouth twice daily for Upper GI Bleed x 8wks   polyethylene glycol powder (GLYCOLAX/MIRALAX) powder Take 17 g by mouth daily as needed for mild constipation or moderate constipation.   simvastatin (ZOCOR) 20 MG tablet TAKE 1 TABLET AT BEDTIME   Sodium Phosphates (RA SALINE ENEMA RE) Place rectally. If not relieved by Biscodyl suppository, give disposable Saline Enema rectally X 1 dose/24 hrs as needed   No facility-administered encounter medications on file as of 08/01/2021.    Review of Systems  GENERAL: No change in appetite, no fatigue, no weight changes, no fever or chills MOUTH and THROAT: Denies oral discomfort, gingival pain or bleeding, pain from teeth or hoarseness   RESPIRATORY: no cough, SOB, DOE, wheezing, hemoptysis CARDIAC: No chest pain or palpitations GI: No abdominal pain, diarrhea, constipation, heart burn, nausea or vomiting NEUROLOGICAL: Denies dizziness, syncope, numbness, or headache PSYCHIATRIC: Denies feelings of depression or anxiety. No report of hallucinations, insomnia, paranoia, or agitation   Immunization History  Administered Date(s) Administered   H1N1 10/30/2015   Influenza, High Dose Seasonal PF 10/16/2020   Influenza-Unspecified 10/28/2014, 09/27/2018   Moderna Sars-Covid-2 Vaccination 01/09/2020, 02/09/2020, 12/12/2020   Pneumococcal Polysaccharide-23 11/15/1995, 10/19/2006   Tdap 01/12/2016   Pertinent  Health Maintenance Due  Topic Date Due   PNA vac Low Risk Adult (2 of 2 - PCV13) 10/20/2007   INFLUENZA VACCINE  07/28/2021   No flowsheet data found.   Vitals:   08/01/21 1657  BP: (!) 154/76  Pulse: (!) 59  Resp: (!) 21  Temp: 97.7 F (36.5 C)  Weight: 246 lb (111.6 kg)  Height: '5\' 10"'$  (1.778 m)   Body mass index is 35.3 kg/m.  Physical Exam  GENERAL APPEARANCE: Well nourished. In no acute distress. Obese  SKIN:  Skin is warm and dry.  MOUTH and THROAT: Lips are without lesions. Oral mucosa is  moist and without lesions.  RESPIRATORY: Breathing is even & unlabored, BS CTAB CARDIAC: RRR, no murmur,no extra heart sounds, no edema GI: Abdomen soft, normal BS, no masses, no tenderness, NEUROLOGICAL: There is no tremor. Speech is clear. Alert and oriented X 3. PSYCHIATRIC:  Affect and behavior are appropriate  Labs reviewed: Recent Labs    07/14/21 0523 07/15/21 0441 07/19/21 0603 07/22/21 0400 07/23/21 0614 07/26/21 0159 07/27/21 0014 07/28/21 0104  NA 136 135   < > 136   < > 134* 136 137  K 4.5 4.5   < > 4.5   < > 4.3 4.4 4.2  CL 108 108   < > 106   < > 105 104 103  CO2 23 22   < > 23   < > '24 25 27  '$ GLUCOSE 99 100*   < > 106*   < > 104* 119* 103*  BUN 35* 32*   < > 73*   < > 35* 32*  30*  CREATININE 1.23 1.22   < > 1.66*   < > 1.64* 1.59* 1.70*  CALCIUM 8.2* 8.0*   < > 8.0*   < > 8.0* 8.1* 8.1*  MG 2.1 1.9  --  2.3  --   --   --   --    < > = values in this interval not displayed.   Recent Labs    07/15/21 0441 07/19/21 0603 07/20/21 0017  AST 45* 62* 56*  ALT 55* 82* 74*  ALKPHOS 38 43 36*  BILITOT 0.5 0.7 0.7  PROT 4.9* 5.1* 4.5*  ALBUMIN 2.3* 2.3* 2.2*   Recent Labs    07/19/21 0603 07/19/21 0643 07/20/21 0017 07/20/21 0703 07/20/21 1053 07/26/21 0159 07/27/21 0014 07/28/21 0104  WBC 14.7*  --  9.3 11.7*   < > 9.7 6.9 6.0  NEUTROABS 12.5*  --  6.8 9.0*  --   --   --   --   HGB 7.8*   < > 8.3* 8.8*   < > 8.5* 8.7* 8.7*  HCT 25.4*   < > 23.7* 25.3*   < > 25.6* 26.3* 26.8*  MCV 103.7*  --  90.1 92.3   < > 93.8 94.9 95.4  PLT 223  --  133* 159   < > 149* 165 196   < > = values in this interval not displayed.   Lab Results  Component Value Date   TSH 1.278 07/11/2021   No results found for: HGBA1C Lab Results  Component Value Date   CHOL 121 06/15/2014   HDL 42 06/15/2014   LDLCALC 67 06/15/2014   TRIG 59 06/15/2014   CHOLHDL 2.9 06/15/2014    Significant Diagnostic Results in last 30 days:  DG Chest 1 View  Result Date:  07/11/2021 CLINICAL DATA:  Fall 6 days ago.  Chest and bilateral hip pain. EXAM: CHEST  1 VIEW COMPARISON:  Radiographs 01/14/2016 and 01/13/2016.  CT 12/20/2005. FINDINGS: 1317 hours. The heart size and mediastinal contours are stable with aortic atherosclerosis. The lungs appear clear. There is no pleural effusion or pneumothorax. There are old left-sided rib fractures and sequela of prior left AC joint injury with heterotopic ossification. No acute osseous findings evident. Telemetry leads overlie the chest. IMPRESSION: No acute cardiopulmonary process. Sequela of previous upper left chest trauma. Electronically Signed   By: Richardean Sale M.D.   On: 07/11/2021 13:54   CT Head Wo Contrast  Result Date: 07/11/2021 CLINICAL DATA:  Fall, head trauma EXAM: CT HEAD WITHOUT CONTRAST TECHNIQUE: Contiguous axial images were obtained from the base of the skull through the vertex without intravenous contrast. COMPARISON:  CT head 01/12/2016 FINDINGS: Brain: No acute intracranial hemorrhage. No focal mass lesion. No CT evidence of acute infarction. No midline shift or mass effect. No hydrocephalus. Basilar cisterns are patent. There are periventricular and subcortical white matter hypodensities. Generalized cortical atrophy. Vascular: No hyperdense vessel or unexpected calcification. Skull: Normal. Negative for fracture or focal lesion. Sinuses/Orbits: Paranasal sinuses and mastoid air cells are clear. Orbits are clear. Other: None. IMPRESSION: 1. No intracranial trauma. 2. Advanced atrophy and white matter microvascular disease. Electronically Signed   By: Suzy Bouchard M.D.   On: 07/11/2021 14:38   DG CHEST PORT 1 VIEW  Result Date: 07/19/2021 CLINICAL DATA:  Hemoptysis yesterday EXAM: PORTABLE CHEST 1 VIEW COMPARISON:  07/11/2021 FINDINGS: Normal heart size and mediastinal contours. Low volume chest. No acute infiltrate or edema. No effusion or pneumothorax. No acute osseous findings.  Artifact from EKG leads.  IMPRESSION: Stable exam.  No acute finding. Electronically Signed   By: Monte Fantasia M.D.   On: 07/19/2021 09:03   ECHOCARDIOGRAM COMPLETE  Result Date: 07/12/2021    ECHOCARDIOGRAM REPORT   Patient Name:   KAYEDEN VANDERSLUIS Date of Exam: 07/12/2021 Medical Rec #:  [id number]       Height:       70.0 in Accession #:    [id number]      Weight:       237.0 lb Date of Birth:  May 11, 1925        BSA:          2.243 m Patient Age:    40 years        BP:           107/64 mmHg Patient Gender: M               HR:           112 bpm. Exam Location:  [facility] Procedure: 2D Echo, Cardiac Doppler and Color Doppler Indications:    I48.0 Paroxysmal atrial fibrillation  History:        Patient has prior history of Echocardiogram examinations, most                 recent 12/22/2005. CAD; Risk Factors:Hypertension and                 Dyslipidemia. Cancer.  Sonographer:    Jonelle Sidle Dance Referring Phys: [id number] [address] [place]  1. Left ventricular ejection fraction, by estimation, is 65 to 70%. The left ventricle has hyperdynamic function. The left ventricle has no regional wall motion abnormalities. There is mild concentric left ventricular hypertrophy. Left ventricular diastolic function could not be evaluated.  2. Right ventricular systolic function is normal. The right ventricular size is normal. There is normal pulmonary artery systolic pressure.  3. Left atrial size was moderately dilated.  4. Right atrial size was mildly dilated.  5. The mitral valve is normal in structure. No evidence of mitral valve regurgitation.  6. The aortic valve is tricuspid. There is moderate calcification of the aortic valve. There is moderate thickening of the aortic valve. Aortic valve regurgitation is not visualized. Mild to moderate aortic valve stenosis.  7. Aortic dilatation noted. There is mild dilatation of the aortic root, measuring 40 mm. There is mild dilatation of the ascending aorta, measuring 39 mm.  8. The inferior vena cava  is normal in size with greater than 50% respiratory variability, suggesting right atrial pressure of 3 mmHg. FINDINGS  Left Ventricle: Left ventricular ejection fraction, by estimation, is 65 to 70%. The left ventricle has hyperdynamic function. The left ventricle has no regional wall motion abnormalities. The left ventricular internal cavity size was normal in size. There is mild concentric left ventricular hypertrophy. Left ventricular diastolic function could not be evaluated due to atrial fibrillation. Left ventricular diastolic function could not be evaluated. Right Ventricle: The right ventricular size is normal. No increase in right ventricular wall thickness. Right ventricular systolic function is normal. There is normal pulmonary artery systolic pressure. The tricuspid regurgitant velocity is 2.72 m/s, and  with an assumed right atrial pressure of 3 mmHg, the estimated right ventricular systolic pressure is [id number] mmHg. Left Atrium: Left atrial size was moderately dilated. Right Atrium: Right atrial size was mildly dilated. Pericardium: There is no evidence of pericardial effusion. Mitral Valve: The mitral valve is normal in structure. Mild  to moderate mitral annular calcification. No evidence of mitral valve regurgitation. Tricuspid Valve: The tricuspid valve is normal in structure. Tricuspid valve regurgitation is trivial. Aortic Valve: The aortic valve is tricuspid. There is moderate calcification of the aortic valve. There is moderate thickening of the aortic valve. Aortic valve regurgitation is not visualized. Mild to moderate aortic stenosis is present. Aortic valve mean gradient measures 14.8 mmHg. Aortic valve peak gradient measures 28.6 mmHg. Aortic valve area, by VTI measures 1.35 cm. Pulmonic Valve: The pulmonic valve was not well visualized. Pulmonic valve regurgitation is not visualized. Aorta: Aortic dilatation noted. There is mild dilatation of the aortic root, measuring 40 mm. There is mild  dilatation of the ascending aorta, measuring 39 mm. Venous: The inferior vena cava is normal in size with greater than 50% respiratory variability, suggesting right atrial pressure of 3 mmHg. IAS/Shunts: No atrial level shunt detected by color flow Doppler.  LEFT VENTRICLE PLAX 2D LVIDd:         3.08 cm LVIDs:         2.98 cm LV PW:         1.36 cm LV IVS:        1.24 cm LVOT diam:     2.10 cm LV SV:         57 LV SV Index:   26 LVOT Area:     3.46 cm  RIGHT VENTRICLE RV Basal diam:  3.21 cm TAPSE (M-mode): 1.7 cm LEFT ATRIUM             Index       RIGHT ATRIUM           Index LA diam:        4.60 cm 2.05 cm/m  RA Area:     16.30 cm LA Vol (A2C):   74.8 ml 33.34 ml/m RA Volume:   36.70 ml  16.36 ml/m LA Vol (A4C):   73.9 ml 32.94 ml/m LA Biplane Vol: 79.1 ml 35.26 ml/m  AORTIC VALVE AV Area (Vmax):    1.26 cm AV Area (Vmean):   1.29 cm AV Area (VTI):     1.35 cm AV Vmax:           267.50 cm/s AV Vmean:          178.500 cm/s AV VTI:            0.427 m AV Peak Grad:      28.6 mmHg AV Mean Grad:      14.8 mmHg LVOT Vmax:         97.55 cm/s LVOT Vmean:        66.650 cm/s LVOT VTI:          0.166 m LVOT/AV VTI ratio: 0.39  AORTA Ao Root diam: 4.00 cm Ao Asc diam:  3.90 cm MITRAL VALVE               TRICUSPID VALVE MV Area (PHT): 2.81 cm    TR Peak grad:   29.6 mmHg MV Decel Time: 270 msec    TR Vmax:        272.00 cm/s MV E velocity: 99.90 cm/s                            SHUNTS                            Systemic VTI:  0.17 m                            Systemic Diam: 2.10 cm Sanda Klein MD Electronically signed by Sanda Klein MD Signature Date/Time: 07/12/2021/5:32:23 PM    Final    DG Hips Bilat W or Wo Pelvis 3-4 Views  Result Date: 07/11/2021 CLINICAL DATA:  Bilateral hip pain since falling 6 days ago. EXAM: DG HIP (WITH OR WITHOUT PELVIS) 3-4V BILAT COMPARISON:  Radiographs 05/09/2015. FINDINGS: The bones appear demineralized. There is no evidence of acute fracture, dislocation or bone  destruction. The hip joint spaces are preserved. There are mild degenerative changes in the lower lumbar spine and sacroiliac joints. There are surgical clips in both groins and scattered vascular calcifications. IMPRESSION: No evidence of acute fracture, dislocation or bone destruction. Scattered vascular calcifications. Electronically Signed   By: Richardean Sale M.D.   On: 07/11/2021 13:56    Assessment/Plan  1. Elevated d-dimer -  d-dimer  13.29, elevated -  will start on Lovenox 40 mg daily  2. Gastrointestinal hemorrhage with melena -Continue pantoprazole  3. PAF (paroxysmal atrial fibrillation) (HCC) -    rate controlled, continue metoprolol tartrate 12.5 mg twice a day for rate control -    not on any anticoagulation due to a recent GI bleed -   Continue aspirin EC 81 mg daily    Family/ staff Communication:   Discussed plan of care with resident and charge nurse.  Labs/tests ordered:  CBC on 08/04/21  Goals of care:   Short-term care   Durenda Age, DNP, MSN, FNP-BC [facility] [phone] (Monday-Friday 8:00 a.m. - 5:00 p.m.) [phone] (after hours)

## 2021-08-02 NOTE — Assessment & Plan Note (Signed)
This should be reevaluated once stable. He seemed to be a good historian, but there is discrepancy between D/C summary & his history as to NSAID ingestion PTA.

## 2021-08-02 NOTE — Assessment & Plan Note (Addendum)
He denies abdominal pain, dyspepsia, dysphagia, melena, or rectal bleeding. Triggers for GERD discussed

## 2021-08-05 LAB — CBC AND DIFFERENTIAL
HCT: 31 — AB (ref 41–53)
Hemoglobin: 10 — AB (ref 13.5–17.5)
Platelets: 170 (ref 150–399)
WBC: 5

## 2021-08-05 LAB — CBC: RBC: 3.31 — AB (ref 3.87–5.11)

## 2021-08-19 ENCOUNTER — Non-Acute Institutional Stay (SKILLED_NURSING_FACILITY): Payer: Medicare Other | Admitting: Adult Health

## 2021-08-19 ENCOUNTER — Encounter: Payer: Self-pay | Admitting: Adult Health

## 2021-08-19 DIAGNOSIS — N319 Neuromuscular dysfunction of bladder, unspecified: Secondary | ICD-10-CM | POA: Diagnosis not present

## 2021-08-19 DIAGNOSIS — R7989 Other specified abnormal findings of blood chemistry: Secondary | ICD-10-CM

## 2021-08-19 DIAGNOSIS — K921 Melena: Secondary | ICD-10-CM | POA: Diagnosis not present

## 2021-08-19 DIAGNOSIS — I4819 Other persistent atrial fibrillation: Secondary | ICD-10-CM

## 2021-08-19 DIAGNOSIS — I480 Paroxysmal atrial fibrillation: Secondary | ICD-10-CM

## 2021-08-19 DIAGNOSIS — I2510 Atherosclerotic heart disease of native coronary artery without angina pectoris: Secondary | ICD-10-CM

## 2021-08-19 NOTE — Progress Notes (Signed)
Location:  [facility] Room Number: [id number] A Place of Service:  SNF (31) Provider:  Durenda Age, DNP, FNP-BC  Patient Care Team: Sharilyn Sites, MD as PCP - General (Family Medicine)  Extended Emergency Contact Information Primary Emergency Contact: Natale Lay, [place] Phone: [phone] Mobile Phone: [phone] Relation: Son Secondary Emergency Contact: [facility] Phone: [phone] Relation: Son  Code Status:    Goals of care: Advanced Directive information Advanced Directives 08/01/2021  Does Patient Have a Medical Advance Directive? No  Type of Advance Directive -  Does patient want to make changes to medical advance directive? -  Copy of [place] in Chart? -  Would patient like information on creating a medical advance directive? -     Chief Complaint  Patient presents with   Acute Visit    Short-term rehabilitation     HPI:  Pt is a 87 y.o. male seen today for short-term rehabilitation visit. He has a PMH of CAD, hyperlipidemia, obesity, history of penile cancer with neurogenic bladder. He has a chronic indwelling foley catheter due to neurogenic bladder. He takes Metoprolol tartrate 12.5 mg BID for PAF. He denies having palpitations. He takes Lovenox for elevated d-dimer 13.29. No SOB. He has BLE edema. He denies abdominal pain nor blood in stool. He takes  Pantoprazole 40 mg BID for GI bleed.   Past Medical History:  Diagnosis Date   Coronary artery disease    Hyperlipidemia    Obesity    Penile cancer ([place])    Swelling of extremity    Left Leg   Past Surgical History:  Procedure Laterality Date   APPENDECTOMY     BACK SURGERY     BIOPSY  07/22/2021   Procedure: BIOPSY;  Surgeon: Jackquline Denmark, MD;  Location: [facility] ENDOSCOPY;  Service: Gastroenterology;;   CHOLECYSTECTOMY     CORONARY ANGIOPLASTY  11/2005   RCA PCI AND STENTING WITH A CYPHER DES    CORONARY ARTERY BYPASS GRAFT     ESOPHAGOGASTRODUODENOSCOPY (EGD) WITH PROPOFOL N/A 07/22/2021   Procedure: ESOPHAGOGASTRODUODENOSCOPY (EGD) WITH PROPOFOL;  Surgeon: Jackquline Denmark, MD;  Location: [facility];  Service: Gastroenterology;  Laterality: N/A;   HIATAL HERNIA REPAIR     lymph node removal     NM MYOCAR MULTIPLE W/SPECT  11/26/2009   EF 62%. NORMAL MYOCARDIAL PERFUSION STUDY.   TRANSTHORACIC ECHOCARDIOGRAM  12/22/2005   MILD TO MOD AORTIC SCLEROSIS W/O STENOSIS. MILD TO MODERATE MITRAL CALCIFICATION. LA- MILDLY DILATED.    No Known Allergies  Outpatient Encounter Medications as of 08/19/2021  Medication Sig   acetaminophen (TYLENOL) 325 MG tablet Take 2 tablets (650 mg total) by mouth every 6 (six) hours as needed for mild pain (or Fever >/= 101).   aspirin EC 81 MG tablet Take 81 mg by mouth daily.   bisacodyl (DULCOLAX) 10 MG suppository If not relieved by MOM, give 10 mg Bisacodyl suppositiory rectally X 1 dose in 24 hours as needed   Calcium Carbonate-Vitamin D (CALCIUM-D PO) Take 1 tablet by mouth daily.   furosemide (LASIX) 20 MG tablet Take 1 tablet (20 mg total) by mouth daily.   lactulose (CHRONULAC) 10 GM/15ML solution Take 10 g by mouth 2 (two) times daily as needed for mild constipation.   Magnesium Hydroxide (MILK OF MAGNESIA PO) Take by mouth. If no BM in 3 days, give 30 cc Milk of Magnesium p.o. x 1 dose  in 24 hours as needed (Do not use standing constipation orders for residents with renal failure CFR less than 30. Contact MD for orders)   metoprolol tartrate (LOPRESSOR) 25 MG tablet Take 0.5 tablets (12.5 mg total) by mouth 2 (two) times daily.   Multiple Vitamin (MULTIVITAMIN WITH MINERALS) TABS tablet Take 1 tablet by mouth daily. Centrum Silver   Multiple Vitamins-Minerals (PRESERVISION AREDS 2) CAPS Take by mouth.   NON FORMULARY Diet:Heart healthy   pantoprazole (PROTONIX) 40 MG tablet Take 40 mg by mouth daily. Give one tablet by mouth twice daily for Upper  GI Bleed x 8wks   polyethylene glycol powder (GLYCOLAX/MIRALAX) powder Take 17 g by mouth daily as needed for mild constipation or moderate constipation.   simvastatin (ZOCOR) 20 MG tablet TAKE 1 TABLET AT BEDTIME   Sodium Phosphates (RA SALINE ENEMA RE) Place rectally. If not relieved by Biscodyl suppository, give disposable Saline Enema rectally X 1 dose/24 hrs as needed   No facility-administered encounter medications on file as of 08/19/2021.    Review of Systems  GENERAL: No change in appetite, no fatigue, no weight changes, no fever, chills or weakness MOUTH and THROAT: Denies oral discomfort, gingival pain or bleeding, pain from teeth or hoarseness   RESPIRATORY: no cough, SOB, DOE, wheezing, hemoptysis CARDIAC: No chest pain, edema or palpitations GI: No abdominal pain, diarrhea, constipation, heart burn, nausea or vomiting GU: Denies dysuria, frequency, hematuria, incontinence, or discharge NEUROLOGICAL: Denies dizziness, syncope, numbness, or headache PSYCHIATRIC: Denies feelings of depression or anxiety. No report of hallucinations, insomnia, paranoia, or agitation   Immunization History  Administered Date(s) Administered   H1N1 10/30/2015   Influenza, High Dose Seasonal PF 10/16/2020   Influenza-Unspecified 10/28/2014, 09/27/2018   Moderna Sars-Covid-2 Vaccination 01/09/2020, 02/09/2020, 12/12/2020   Pneumococcal Polysaccharide-23 11/15/1995, 10/19/2006   Tdap 01/12/2016   Pertinent  Health Maintenance Due  Topic Date Due   PNA vac Low Risk Adult (2 of 2 - PCV13) 10/20/2007   INFLUENZA VACCINE  07/28/2021   No flowsheet data found.   Vitals:   08/19/21 1300  BP: 112/68  Pulse: 82  Resp: 20  Temp: 97.9 F (36.6 C)  Weight: 232 lb 3.2 oz (105.3 kg)  Height: '5\' 7"'$  (1.702 m)   Body mass index is 36.37 kg/m.  Physical Exam  GENERAL APPEARANCE: Well nourished. In no acute distress. Normal body habitus SKIN:  Skin is warm and dry.  MOUTH and THROAT: Lips are  without lesions. Oral mucosa is moist and without lesions.  RESPIRATORY: Breathing is even & unlabored, BS CTAB CARDIAC: no murmur,no extra heart sounds, no edema GI: Abdomen soft, normal BS, no masses, no tenderness NEUROLOGICAL: There is no tremor. Speech is clear. Alert and oriented X 3. PSYCHIATRIC:  Affect and behavior are appropriate  Labs reviewed: Recent Labs    07/14/21 0523 07/15/21 0441 07/19/21 0603 07/22/21 0400 07/23/21 0614 07/26/21 0159 07/27/21 0014 07/28/21 0104  NA 136 135   < > 136   < > 134* 136 137  K 4.5 4.5   < > 4.5   < > 4.3 4.4 4.2  CL 108 108   < > 106   < > 105 104 103  CO2 23 22   < > 23   < > '24 25 27  '$ GLUCOSE 99 100*   < > 106*   < > 104* 119* 103*  BUN 35* 32*   < > 73*   < > 35* 32* 30*  CREATININE 1.23  1.22   < > 1.66*   < > 1.64* 1.59* 1.70*  CALCIUM 8.2* 8.0*   < > 8.0*   < > 8.0* 8.1* 8.1*  MG 2.1 1.9  --  2.3  --   --   --   --    < > = values in this interval not displayed.   Recent Labs    07/15/21 0441 07/19/21 0603 07/20/21 0017  AST 45* 62* 56*  ALT 55* 82* 74*  ALKPHOS 38 43 36*  BILITOT 0.5 0.7 0.7  PROT 4.9* 5.1* 4.5*  ALBUMIN 2.3* 2.3* 2.2*   Recent Labs    07/19/21 0603 07/19/21 0643 07/20/21 0017 07/20/21 0703 07/20/21 1053 07/26/21 0159 07/27/21 0014 07/28/21 0104  WBC 14.7*  --  9.3 11.7*   < > 9.7 6.9 6.0  NEUTROABS 12.5*  --  6.8 9.0*  --   --   --   --   HGB 7.8*   < > 8.3* 8.8*   < > 8.5* 8.7* 8.7*  HCT 25.4*   < > 23.7* 25.3*   < > 25.6* 26.3* 26.8*  MCV 103.7*  --  90.1 92.3   < > 93.8 94.9 95.4  PLT 223  --  133* 159   < > 149* 165 196   < > = values in this interval not displayed.   Lab Results  Component Value Date   TSH 1.278 07/11/2021   No results found for: HGBA1C Lab Results  Component Value Date   CHOL 121 06/15/2014   HDL 42 06/15/2014   LDLCALC 67 06/15/2014   TRIG 59 06/15/2014   CHOLHDL 2.9 06/15/2014    Significant Diagnostic Results in last 30 days:  No results  found.  Assessment/Plan  1. Neurogenic bladder -  has chronic indwelling catheter -  catheter care daily  2. PAF (paroxysmal atrial fibrillation) (HCC) -  rate-controlled -  continue Metoprolol tartrate  3. Elevated d-dimer -  13.29, d-dimer, 8/05 -  no SOB -  continue Lovenox injections  4. Gastrointestinal hemorrhage with melena -  denies blood in stool nor abdominal pain -  continue Pantoprazole     Family/ staff Communication:   Discussed plan of care with resident and charge nurse.  Labs/tests ordered:   d-dimer, CBC and CMP  Goals of care:   Short-term care   Durenda Age, DNP, MSN, FNP-BC [facility] [phone] (Monday-Friday 8:00 a.m. - 5:00 p.m.) [phone] (after hours)

## 2021-08-20 LAB — CBC: RBC: 3.64 — AB (ref 3.87–5.11)

## 2021-08-20 LAB — BASIC METABOLIC PANEL
BUN: 31 — AB (ref 4–21)
CO2: 20 (ref 13–22)
Chloride: 105 (ref 99–108)
Creatinine: 1.5 — AB (ref 0.6–1.3)
Glucose: 101
Potassium: 4.7 (ref 3.4–5.3)
Sodium: 140 (ref 137–147)

## 2021-08-20 LAB — CBC AND DIFFERENTIAL
HCT: 34 — AB (ref 41–53)
Hemoglobin: 11.3 — AB (ref 13.5–17.5)
Platelets: 153 (ref 150–399)
WBC: 9

## 2021-08-20 LAB — HEPATIC FUNCTION PANEL
ALT: 10 (ref 10–40)
AST: 16 (ref 14–40)

## 2021-08-20 LAB — COMPREHENSIVE METABOLIC PANEL
Albumin: 3.8 (ref 3.5–5.0)
Calcium: 9.3 (ref 8.7–10.7)
GFR calc Af Amer: 44.28
GFR calc non Af Amer: 38.21

## 2021-08-25 ENCOUNTER — Telehealth: Payer: Self-pay | Admitting: Urology

## 2021-08-25 NOTE — Telephone Encounter (Signed)
PT's son icalled and stated pt is in a rehab facility in [place]. He will need his catheter changed and the last time he had it changed was 7/29. He was wondering about Home Health. Please call the son when you get time.

## 2021-08-27 NOTE — Telephone Encounter (Signed)
Returned call with no answer. Message left to return call to office. 

## 2021-09-05 LAB — BASIC METABOLIC PANEL
BUN: 19 (ref 4–21)
CO2: 25 — AB (ref 13–22)
Chloride: 107 (ref 99–108)
Creatinine: 1.3 (ref 0.6–1.3)
Glucose: 87
Potassium: 4.6 (ref 3.4–5.3)
Sodium: 141 (ref 137–147)

## 2021-09-05 LAB — COMPREHENSIVE METABOLIC PANEL
Calcium: 9.1 (ref 8.7–10.7)
GFR calc Af Amer: 52.44
GFR calc non Af Amer: 45.25

## 2021-09-09 ENCOUNTER — Non-Acute Institutional Stay (SKILLED_NURSING_FACILITY): Payer: Medicare Other | Admitting: Adult Health

## 2021-09-09 ENCOUNTER — Encounter: Payer: Self-pay | Admitting: Adult Health

## 2021-09-09 DIAGNOSIS — N3000 Acute cystitis without hematuria: Secondary | ICD-10-CM | POA: Diagnosis not present

## 2021-09-09 DIAGNOSIS — N319 Neuromuscular dysfunction of bladder, unspecified: Secondary | ICD-10-CM

## 2021-09-09 NOTE — Progress Notes (Addendum)
Location:  [place] Room Number: [id number] A Place of Service:  SNF (31) Provider:  Durenda Age, DNP, FNP-BC  Patient Care Team: Sharilyn Sites, MD as PCP - General (Family Medicine)  Extended Emergency Contact Information Primary Emergency Contact: Natale Lay, [place] Phone: [phone] Mobile Phone: [phone] Relation: Son Secondary Emergency Contact: [facility] Phone: [phone] Relation: Son  Code Status:  FULL CODE  Goals of care: Advanced Directive information Advanced Directives 08/01/2021  Does Patient Have a Medical Advance Directive? No  Type of Advance Directive -  Does patient want to make changes to medical advance directive? -  Copy of [place] in Chart? -  Would patient like information on creating a medical advance directive? -     Chief Complaint  Patient presents with   Acute Visit    UTI    HPI:  Pt is a 87 y.o. male seen today for an acute visit for an abnormal urine culture which showed > 100,000 CFU/mL lactose fermenting gram-negative rods, E. coli isolate.  He has a chronic indwelling Foley catheter.  Urinalysis was done due to pain change urine output. He denies chills and fever.  He was seen in the room with son at bedside.    Past Medical History:  Diagnosis Date   Coronary artery disease    Hyperlipidemia    Obesity    Penile cancer ([place])    Swelling of extremity    Left Leg   Past Surgical History:  Procedure Laterality Date   APPENDECTOMY     BACK SURGERY     BIOPSY  07/22/2021   Procedure: BIOPSY;  Surgeon: Jackquline Denmark, MD;  Location: [facility] ENDOSCOPY;  Service: Gastroenterology;;   CHOLECYSTECTOMY     CORONARY ANGIOPLASTY  11/2005   RCA PCI AND STENTING WITH A CYPHER DES   CORONARY ARTERY BYPASS GRAFT     ESOPHAGOGASTRODUODENOSCOPY (EGD) WITH PROPOFOL N/A 07/22/2021   Procedure: ESOPHAGOGASTRODUODENOSCOPY (EGD) WITH PROPOFOL;   Surgeon: Jackquline Denmark, MD;  Location: [place];  Service: Gastroenterology;  Laterality: N/A;   HIATAL HERNIA REPAIR     lymph node removal     NM MYOCAR MULTIPLE W/SPECT  11/26/2009   EF 62%. NORMAL MYOCARDIAL PERFUSION STUDY.   TRANSTHORACIC ECHOCARDIOGRAM  12/22/2005   MILD TO MOD AORTIC SCLEROSIS W/O STENOSIS. MILD TO MODERATE MITRAL CALCIFICATION. LA- MILDLY DILATED.    No Known Allergies  Outpatient Encounter Medications as of 09/09/2021  Medication Sig   acetaminophen (TYLENOL) 325 MG tablet Take 2 tablets (650 mg total) by mouth every 6 (six) hours as needed for mild pain (or Fever >/= 101).   aspirin EC 81 MG tablet Take 81 mg by mouth daily.   Calcium Carbonate-Vitamin D (CALCIUM-D PO) Take 1 tablet by mouth daily.   lactulose (CHRONULAC) 10 GM/15ML solution Take 10 g by mouth 2 (two) times daily as needed for mild constipation.   metoprolol tartrate (LOPRESSOR) 25 MG tablet Take 0.5 tablets (12.5 mg total) by mouth 2 (two) times daily.   Multiple Vitamin (MULTIVITAMIN WITH MINERALS) TABS tablet Take 1 tablet by mouth daily. Centrum Silver   Multiple Vitamins-Minerals (PRESERVISION AREDS 2) CAPS Take by mouth.   nitrofurantoin, macrocrystal-monohydrate, (MACROBID) 100 MG capsule Take 100 mg by mouth 2 (two) times daily. For 7 days starting 09/08/2021   NON FORMULARY Diet:Heart healthy   pantoprazole (PROTONIX) 40 MG tablet Take 40 mg by mouth  daily. Give one tablet by mouth twice daily for Upper GI Bleed x 8wks   polyethylene glycol powder (GLYCOLAX/MIRALAX) powder Take 17 g by mouth daily as needed for mild constipation or moderate constipation.   SACCHAROMYCES BOULARDII PO Take 250 mg by mouth in the morning and at bedtime. Daily for 7 days Starting 09/08/2021   simvastatin (ZOCOR) 20 MG tablet TAKE 1 TABLET AT BEDTIME   [DISCONTINUED] bisacodyl (DULCOLAX) 10 MG suppository If not relieved by MOM, give 10 mg Bisacodyl suppositiory rectally X 1 dose in 24 hours as needed    [DISCONTINUED] furosemide (LASIX) 20 MG tablet Take 1 tablet (20 mg total) by mouth daily.   [DISCONTINUED] Magnesium Hydroxide (MILK OF MAGNESIA PO) Take by mouth. If no BM in 3 days, give 30 cc Milk of Magnesium p.o. x 1 dose in 24 hours as needed (Do not use standing constipation orders for residents with renal failure CFR less than 30. Contact MD for orders)   [DISCONTINUED] Sodium Phosphates (RA SALINE ENEMA RE) Place rectally. If not relieved by Biscodyl suppository, give disposable Saline Enema rectally X 1 dose/24 hrs as needed   No facility-administered encounter medications on file as of 09/09/2021.    Review of Systems  GENERAL: No change in appetite, no fatigue, no weight changes, no fever or chills MOUTH and THROAT: Denies oral discomfort, gingival pain or bleeding RESPIRATORY: no cough, SOB, DOE, wheezing, hemoptysis CARDIAC: No chest pain, edema or palpitations GI: No abdominal pain, diarrhea, constipation, heart burn, nausea or vomiting GU: Denies discharge NEUROLOGICAL: Denies dizziness, syncope, numbness, or headache PSYCHIATRIC: Denies feelings of depression or anxiety. No report of hallucinations, insomnia, paranoia, or agitation   Immunization History  Administered Date(s) Administered   H1N1 10/30/2015   Influenza, High Dose Seasonal PF 10/16/2020   Influenza-Unspecified 10/28/2014, 09/27/2018   Moderna Sars-Covid-2 Vaccination 01/09/2020, 02/09/2020, 12/12/2020   Pneumococcal Polysaccharide-23 11/15/1995, 10/19/2006   Tdap 01/12/2016   Pertinent  Health Maintenance Due  Topic Date Due   PNA vac Low Risk Adult (2 of 2 - PCV13) 10/20/2007   INFLUENZA VACCINE  07/28/2021   No flowsheet data found.   Vitals:   09/09/21 1337  BP: 104/61  Pulse: 88  Resp: 18  Temp: 97.9 F (36.6 C)  Weight: 233 lb 6.4 oz (105.9 kg)  Height: '5\' 7"'$  (1.702 m)   Body mass index is 36.56 kg/m.  Physical Exam  GENERAL APPEARANCE: Well nourished. In no acute distress.  Obese. SKIN:  Skin is warm and dry.  MOUTH and THROAT: Lips are without lesions. Oral mucosa is moist and without lesions.  RESPIRATORY: Breathing is even & unlabored, BS CTAB CARDIAC: RRR, no murmur,no extra heart sounds, BLE 2+ edema, R>L GI: Abdomen soft, normal BS, no masses, no tenderness GU:  has foley catheter draining to urine bag with yellowish urine NEUROLOGICAL: There is no tremor. Speech is clear. Alert and oriented X 3. PSYCHIATRIC:  Affect and behavior are appropriate  Labs reviewed: Recent Labs    07/14/21 0523 07/15/21 0441 07/19/21 0603 07/22/21 0400 07/23/21 0614 07/26/21 0159 07/27/21 0014 07/28/21 0104  NA 136 135   < > 136   < > 134* 136 137  K 4.5 4.5   < > 4.5   < > 4.3 4.4 4.2  CL 108 108   < > 106   < > 105 104 103  CO2 23 22   < > 23   < > '24 25 27  '$ GLUCOSE 99 100*   < >  106*   < > 104* 119* 103*  BUN 35* 32*   < > 73*   < > 35* 32* 30*  CREATININE 1.23 1.22   < > 1.66*   < > 1.64* 1.59* 1.70*  CALCIUM 8.2* 8.0*   < > 8.0*   < > 8.0* 8.1* 8.1*  MG 2.1 1.9  --  2.3  --   --   --   --    < > = values in this interval not displayed.   Recent Labs    07/15/21 0441 07/19/21 0603 07/20/21 0017  AST 45* 62* 56*  ALT 55* 82* 74*  ALKPHOS 38 43 36*  BILITOT 0.5 0.7 0.7  PROT 4.9* 5.1* 4.5*  ALBUMIN 2.3* 2.3* 2.2*   Recent Labs    07/19/21 0603 07/19/21 0643 07/20/21 0017 07/20/21 0703 07/20/21 1053 07/26/21 0159 07/27/21 0014 07/28/21 0104  WBC 14.7*  --  9.3 11.7*   < > 9.7 6.9 6.0  NEUTROABS 12.5*  --  6.8 9.0*  --   --   --   --   HGB 7.8*   < > 8.3* 8.8*   < > 8.5* 8.7* 8.7*  HCT 25.4*   < > 23.7* 25.3*   < > 25.6* 26.3* 26.8*  MCV 103.7*  --  90.1 92.3   < > 93.8 94.9 95.4  PLT 223  --  133* 159   < > 149* 165 196   < > = values in this interval not displayed.   Lab Results  Component Value Date   TSH 1.278 07/11/2021   No results found for: HGBA1C Lab Results  Component Value Date   CHOL 121 06/15/2014   HDL 42 06/15/2014    LDLCALC 67 06/15/2014   TRIG 59 06/15/2014   CHOLHDL 2.9 06/15/2014    Significant Diagnostic Results in last 30 days:  No results found.  Assessment/Plan  1. Acute cystitis without hematuria -  will start on Macrobid 100 mg 1 capsule twice a day x7 days and probiotic 250 mg 1 capsule twice a day x7 days  2. Neurogenic bladder -Monitor urine output -Catheter care daily, catheter was changed last night     Family/ staff Communication: Discussed plan of care with resident and charge nurse.  Labs/tests ordered: None  Goals of care:   Short-term care   Durenda Age, DNP, MSN, FNP-BC [facility] [phone] (Monday-Friday 8:00 a.m. - 5:00 p.m.) [phone] (after hours)

## 2021-09-16 ENCOUNTER — Non-Acute Institutional Stay (SKILLED_NURSING_FACILITY): Payer: Medicare Other | Admitting: Adult Health

## 2021-09-16 ENCOUNTER — Encounter: Payer: Self-pay | Admitting: Adult Health

## 2021-09-16 DIAGNOSIS — I2510 Atherosclerotic heart disease of native coronary artery without angina pectoris: Secondary | ICD-10-CM

## 2021-09-16 DIAGNOSIS — I480 Paroxysmal atrial fibrillation: Secondary | ICD-10-CM

## 2021-09-16 DIAGNOSIS — N3000 Acute cystitis without hematuria: Secondary | ICD-10-CM | POA: Diagnosis not present

## 2021-09-16 DIAGNOSIS — K922 Gastrointestinal hemorrhage, unspecified: Secondary | ICD-10-CM | POA: Diagnosis not present

## 2021-09-16 NOTE — Progress Notes (Signed)
Location:  [place] Room Number: 222-L Place of Service:  SNF (31) Provider:  Durenda Age, DNP, FNP-BC  Patient Care Team: Sharilyn Sites, MD as PCP - General (Family Medicine)  Extended Emergency Contact Information Primary Emergency Contact: Natale Lay, [place] Phone: [phone] Mobile Phone: [phone] Relation: Son Secondary Emergency Contact: [facility] Phone: [phone] Relation: Son  Code Status:  FULL CODE  Goals of care: Advanced Directive information Advanced Directives 09/16/2021  Does Patient Have a Medical Advance Directive? No  Type of Advance Directive -  Does patient want to make changes to medical advance directive? -  Copy of [place] in Chart? -  Would patient like information on creating a medical advance directive? No - Patient declined     Chief Complaint  Patient presents with   Acute Visit    For probable discharge     HPI:  Pt is a 87 y.o. Caleb [name] seen today for probable discharge. He is appealing to the insurance for extension of his stay at [facility], to continue short-term rehabilitation. He does not know yet where he is discharging. He used to be independently in his house.  However, he is now needing additional care and needing 24/7 care.  He was admitted to [facility] on 07/28/21 post hospital admission 07/19/2021 to 07/28/21.  He has a PMH of CAD and currently on Plavix.  Of note, he was having a short-term rehabilitation at [facility] post hospitalization 07/11/2021 to 07/15/2021 for S/P fall at home and was found to have atrial fibrillation with RVR.  He complained of nausea and vomiting and noticed some bloody emesis or secretions in the mouth.  He was noted to have dark stools.  GI was consulted and recommended ICU admission.  He was noted to have hemoglobin a couple points down from recent discharge.  He  continued to have melenic stools in the ED.  Endoscopy was done on 7/26 which was significant for multiple duodenal ulcers in the setting of DAPT.  He had a total of 7 units PRBC transfusion and IV PPI.  He endorsed heavy ibuprofen use secondary to arthritis.  He will need to continue Protonix 40 mg twice daily x8 weeks, then once daily.  When his blood pressure was low, his metoprolol has been held and was restarted on oral amiodarone initially.  Now his BP improved, he was transitioned to metoprolol at a lower dose 12.5 mg twice daily.  He is not an anticoagulation candidate given his current GI bleed with multiple transfusions and history of falls.  Plavix was discontinued and was started on aspirin.   Past Medical History:  Diagnosis Date   Coronary artery disease    Hyperlipidemia    Obesity    Penile cancer ([place])    Swelling of extremity    Left Leg   Past Surgical History:  Procedure Laterality Date   APPENDECTOMY     BACK SURGERY     BIOPSY  07/22/2021   Procedure: BIOPSY;  Surgeon: Jackquline Denmark, MD;  Location: [facility] ENDOSCOPY;  Service: Gastroenterology;;   CHOLECYSTECTOMY     CORONARY ANGIOPLASTY  11/2005   RCA PCI AND STENTING WITH A CYPHER DES   CORONARY ARTERY BYPASS GRAFT     ESOPHAGOGASTRODUODENOSCOPY (EGD) WITH PROPOFOL N/A 07/22/2021   Procedure: ESOPHAGOGASTRODUODENOSCOPY (EGD) WITH PROPOFOL;  Surgeon: Jackquline Denmark, MD;  Location: [place];  Service: Gastroenterology;  Laterality: N/A;   HIATAL HERNIA REPAIR     lymph node removal     NM MYOCAR MULTIPLE W/SPECT  11/26/2009   EF 62%. NORMAL MYOCARDIAL PERFUSION STUDY.   TRANSTHORACIC ECHOCARDIOGRAM  12/22/2005   MILD TO MOD AORTIC SCLEROSIS W/O STENOSIS. MILD TO MODERATE MITRAL CALCIFICATION. LA- MILDLY DILATED.    No Known Allergies  Outpatient Encounter Medications as of 09/16/2021  Medication Sig   acetaminophen (TYLENOL) 325 MG tablet Take 2 tablets (650 mg total) by mouth every 6 (six) hours as needed for  mild pain (or Fever >/= 101).   aspirin EC 81 MG tablet Take 81 mg by mouth daily.   Calcium Carbonate-Vitamin D (CALCIUM-D PO) Take 1 tablet by mouth daily.   lactulose (CHRONULAC) 10 GM/15ML solution Take 10 g by mouth 2 (two) times daily as needed for mild constipation.   metoprolol tartrate (LOPRESSOR) 25 MG tablet Take 0.5 tablets (12.5 mg total) by mouth 2 (two) times daily.   Multiple Vitamin (MULTIVITAMIN WITH MINERALS) TABS tablet Take 1 tablet by mouth daily. Centrum Silver   Multiple Vitamins-Minerals (PRESERVISION AREDS 2) CAPS Take by mouth.   NON FORMULARY Diet:Heart healthy   pantoprazole (PROTONIX) 40 MG tablet Take 40 mg by mouth daily. Give one tablet by mouth twice daily for Upper GI Bleed x 8wks   polyethylene glycol powder (GLYCOLAX/MIRALAX) powder Take 17 g by mouth daily as needed for mild constipation or moderate constipation.   simvastatin (ZOCOR) 20 MG tablet TAKE 1 TABLET AT BEDTIME   [DISCONTINUED] nitrofurantoin, macrocrystal-monohydrate, (MACROBID) 100 MG capsule Take 100 mg by mouth 2 (two) times daily. For 7 days starting 09/08/2021   [DISCONTINUED] SACCHAROMYCES BOULARDII PO Take 250 mg by mouth in the morning and at bedtime. Daily for 7 days Starting 09/08/2021   No facility-administered encounter medications on file as of 09/16/2021.    Review of Systems  GENERAL: No change in appetite, no fatigue, no weight changes, no fever, chills or weakness MOUTH and THROAT: Denies oral discomfort, gingival pain or bleeding, RESPIRATORY: no cough, SOB, DOE, wheezing, hemoptysis CARDIAC: No chest pain, edema or palpitations GI: No abdominal pain, diarrhea, constipation, heart burn, nausea or vomiting NEUROLOGICAL: Denies dizziness, syncope, numbness, or headache PSYCHIATRIC: Denies feelings of depression or anxiety. No report of hallucinations, insomnia, paranoia, or agitation   Immunization History  Administered Date(s) Administered   H1N1 10/30/2015   Influenza,  High Dose Seasonal PF 10/16/2020   Influenza-Unspecified 10/28/2014, 09/27/2018   Moderna Sars-Covid-2 Vaccination 01/09/2020, 02/09/2020, 12/12/2020   Pneumococcal Polysaccharide-23 11/15/1995, 10/19/2006   Tdap 01/12/2016   Pertinent  Health Maintenance Due  Topic Date Due   INFLUENZA VACCINE  07/28/2021   No flowsheet data found.   Vitals:   09/16/21 1338  BP: 135/74  Pulse: 75  Resp: 19  Temp: (!) 97.5 F (36.4 C)  Height: 5\' 7"  (1.702 m)   Body mass index is 36.56 kg/m.  Physical Exam  GENERAL APPEARANCE: Well nourished. In no acute distress. Obese. SKIN:  Skin is warm and dry.  MOUTH and THROAT: Lips are without lesions. Oral mucosa is moist and without lesions.  RESPIRATORY: Breathing is even & unlabored, BS CTAB CARDIAC:  no murmur,no extra heart sounds, no edema GI: Abdomen soft, normal BS, no masses, no tenderness GU:  Has foley catheter NEUROLOGICAL: There is no tremor. Speech is clear. Alert and oriented X 3. PSYCHIATRIC:  Affect and behavior are appropriate  Labs reviewed: Recent Labs    07/14/21 0523 07/15/21 0441 07/19/21  3832 07/22/21 0400 07/23/21 0614 07/26/21 0159 07/27/21 0014 07/28/21 0104  NA 136 135   < > 136   < > 134* 136 137  K 4.5 4.5   < > 4.5   < > 4.3 4.4 4.2  CL 108 108   < > 106   < > 105 104 103  CO2 23 22   < > 23   < > 24 25 27   GLUCOSE 99 100*   < > 106*   < > 104* 119* 103*  BUN 35* 32*   < > 73*   < > 35* 32* 30*  CREATININE 1.23 1.22   < > 1.66*   < > 1.64* 1.59* 1.70*  CALCIUM 8.2* 8.0*   < > 8.0*   < > 8.0* 8.1* 8.1*  MG 2.1 1.9  --  2.3  --   --   --   --    < > = values in this interval not displayed.   Recent Labs    07/15/21 0441 07/19/21 0603 07/20/21 0017  AST 45* 62* 56*  ALT 55* 82* 74*  ALKPHOS Caleb 43 36*  BILITOT 0.5 0.7 0.7  PROT 4.9* 5.1* 4.5*  ALBUMIN 2.3* 2.3* 2.2*   Recent Labs    07/19/21 0603 07/19/21 0643 07/20/21 0017 07/20/21 0703 07/20/21 1053 07/26/21 0159 07/27/21 0014  07/28/21 0104  WBC 14.7*  --  9.3 11.7*   < > 9.7 6.9 6.0  NEUTROABS 12.5*  --  6.8 9.0*  --   --   --   --   HGB 7.8*   < > 8.3* 8.8*   < > 8.5* 8.7* 8.7*  HCT 25.4*   < > 23.7* 25.3*   < > 25.6* 26.3* 26.8*  MCV 103.7*  --  90.1 92.3   < > 93.8 94.9 95.4  PLT 223  --  133* 159   < > 149* 165 196   < > = values in this interval not displayed.   Lab Results  Component Value Date   TSH 1.278 07/11/2021   No results found for: HGBA1C Lab Results  Component Value Date   CHOL 121 06/15/2014   HDL 42 06/15/2014   LDLCALC 67 06/15/2014   TRIG 59 06/15/2014   CHOLHDL 2.9 06/15/2014    Significant Diagnostic Results in last 30 days:  No results found.  Assessment/Plan  1. Acute cystitis without hematuria -Recently completed Macrobid X 7 days  2. Upper GI bleed -   No reported hemoptysis, continue pantoprazole 40 twice a day x8 weeks then daily  3. PAF (paroxysmal atrial fibrillation) (HCC) -Rate controlled, continue metoprolol titrate 12.5 mg twice a day  4. Coronary artery disease involving native coronary artery of native heart without angina pectoris -Denies chest pain, continue aspirin EC 81 mg daily and simvastatin 20 mg at bedtime     Family/ staff Communication:   Discussed plan of care with resident and charge nurse.  Labs/tests ordered:   None  Goals of care:   Short-term care   Durenda Age, DNP, MSN, FNP-BC [facility] and Adult Medicine [phone] (Monday-Friday 8:00 a.m. - 5:00 p.m.) [phone] (after hours)

## 2021-09-23 ENCOUNTER — Non-Acute Institutional Stay (SKILLED_NURSING_FACILITY): Payer: Medicare Other | Admitting: Adult Health

## 2021-09-23 ENCOUNTER — Other Ambulatory Visit: Payer: Self-pay | Admitting: Adult Health

## 2021-09-23 ENCOUNTER — Encounter: Payer: Self-pay | Admitting: Adult Health

## 2021-09-23 DIAGNOSIS — N3000 Acute cystitis without hematuria: Secondary | ICD-10-CM

## 2021-09-23 DIAGNOSIS — I480 Paroxysmal atrial fibrillation: Secondary | ICD-10-CM | POA: Diagnosis not present

## 2021-09-23 DIAGNOSIS — K922 Gastrointestinal hemorrhage, unspecified: Secondary | ICD-10-CM

## 2021-09-23 DIAGNOSIS — N319 Neuromuscular dysfunction of bladder, unspecified: Secondary | ICD-10-CM

## 2021-09-23 DIAGNOSIS — R531 Weakness: Secondary | ICD-10-CM

## 2021-09-23 DIAGNOSIS — I2510 Atherosclerotic heart disease of native coronary artery without angina pectoris: Secondary | ICD-10-CM

## 2021-09-23 DIAGNOSIS — K5901 Slow transit constipation: Secondary | ICD-10-CM

## 2021-09-23 MED ORDER — POLYETHYLENE GLYCOL 3350 17 GM/SCOOP PO POWD: 17.0000 g | Freq: Every day | ORAL | 1 refills | Status: DC

## 2021-09-23 MED ORDER — SIMVASTATIN 20 MG PO TABS: 20.0000 mg | ORAL_TABLET | Freq: Every day | ORAL | 0 refills | Status: DC

## 2021-09-23 MED ORDER — ASPIRIN EC 81 MG PO TBEC: 81.0000 mg | DELAYED_RELEASE_TABLET | Freq: Every day | ORAL | 0 refills | Status: DC

## 2021-09-23 MED ORDER — LACTULOSE 10 GM/15ML PO SOLN: 10.0000 g | Freq: Two times a day (BID) | ORAL | 0 refills | Status: DC | PRN

## 2021-09-23 MED ORDER — PANTOPRAZOLE SODIUM 40 MG PO TBEC: 40.0000 mg | DELAYED_RELEASE_TABLET | Freq: Every day | ORAL | 0 refills | Status: DC

## 2021-09-23 MED ORDER — METOPROLOL TARTRATE 25 MG PO TABS: 12.5000 mg | ORAL_TABLET | Freq: Two times a day (BID) | ORAL | 0 refills | Status: DC

## 2021-09-23 NOTE — Progress Notes (Signed)
Location:  [place] Room Number: 542-H Place of Service:  SNF (31) Provider:  Durenda Age, DNP, FNP-BC  Patient Care Team: Sharilyn Sites, MD as PCP - General (Family Medicine)  Extended Emergency Contact Information Primary Emergency Contact: Natale Lay, [place] of [place] Phone: [phone] Mobile Phone: [phone] Relation: Son Secondary Emergency Contact: [facility] Phone: [phone] Relation: Son  Code Status:  FULL CODE  Goals of care: Advanced Directive information Advanced Directives 09/23/2021  Does Patient Have a Medical Advance Directive? No  Type of Advance Directive -  Does patient want to make changes to medical advance directive? -  Copy of [place] in Chart? -  Would patient like information on creating a medical advance directive? No - Patient declined     Chief Complaint  Patient presents with   Discharge Note    For discharge to [facility] on 09/23/21 with Home health PT and OT     HPI:  Pt is a 87 y.o. male who is for discharge to [place] with Home health PT and OT.  He was admitted to [place] on 07/28/21 post hospital admission 07/19/21 to 07/28/21.  He has a PMH of CAD and currently on Plavix.  He was having a short-term rehabilitation at [facility] post hospitalization 07/11/2021 to 07/15/2021 for S/P fall at home and was found to have atrial fibrillation with RVR. On 07/28/21, he complained of nausea and vomiting and noticed some bloody emesis or secretions in the mouth.  He was noted to have dark stools and was transferred to ED. GI was consulted and recommended ICU admission.  He was noted to have hemoglobin couple points down from recent discharge.  He continued to have melenic stools in the ED.  Endoscopy was done on 7/26 which was significant for multiple duodenal ulcers in the setting of DAPT.  He had a total of 7  units PRBC transfusion and IV PPI.  He endorsed heavy ibuprofen use secondary to arthritis.  He was put on Protonix 40 mg twice daily x8 weeks then once daily.  He was initially restarted on oral amiodarone and was transitioned to metoprolol.  His BPs were low so metoprolol was adjusted to 12.5 mg twice daily.  He is not an anticoagulation candidate given his current GI bleed with multiple transfusions and history of falls.  Plavix was discontinued and was started on aspirin.  Patient was admitted to this facility for short-term rehabilitation after the patient's recent hospitalization.  Patient has completed SNF rehabilitation and therapy has cleared the patient for discharge.   Past Medical History:  Diagnosis Date   Coronary artery disease    Hyperlipidemia    Obesity    Penile cancer ([place])    Swelling of extremity    Left Leg   Past Surgical History:  Procedure Laterality Date   APPENDECTOMY     BACK SURGERY     BIOPSY  07/22/2021   Procedure: BIOPSY;  Surgeon: Jackquline Denmark, MD;  Location: [facility] ENDOSCOPY;  Service: Gastroenterology;;   CHOLECYSTECTOMY     CORONARY ANGIOPLASTY  11/2005   RCA PCI AND STENTING WITH A CYPHER DES   CORONARY ARTERY BYPASS GRAFT     ESOPHAGOGASTRODUODENOSCOPY (EGD) WITH PROPOFOL N/A 07/22/2021   Procedure: ESOPHAGOGASTRODUODENOSCOPY (EGD) WITH PROPOFOL;  Surgeon: Jackquline Denmark, MD;  Location: [place];  Service: Gastroenterology;  Laterality: N/A;   HIATAL HERNIA REPAIR  lymph node removal     NM MYOCAR MULTIPLE W/SPECT  11/26/2009   EF 62%. NORMAL MYOCARDIAL PERFUSION STUDY.   TRANSTHORACIC ECHOCARDIOGRAM  12/22/2005   MILD TO MOD AORTIC SCLEROSIS W/O STENOSIS. MILD TO MODERATE MITRAL CALCIFICATION. LA- MILDLY DILATED.    No Known Allergies  Outpatient Encounter Medications as of 09/23/2021  Medication Sig   acetaminophen (TYLENOL) 325 MG tablet Take 2 tablets (650 mg total) by mouth every 6 (six) hours as needed for mild pain (or Fever >/=  101).   aspirin EC 81 MG tablet Take 1 tablet (81 mg total) by mouth daily.   Calcium Carbonate-Vitamin D (CALCIUM-D PO) Take 1 tablet by mouth daily.   lactulose (CHRONULAC) 10 GM/15ML solution Take 15 mLs (10 g total) by mouth 2 (two) times daily as needed for mild constipation.   metoprolol tartrate (LOPRESSOR) 25 MG tablet Take 0.5 tablets (12.5 mg total) by mouth 2 (two) times daily.   Multiple Vitamin (MULTIVITAMIN WITH MINERALS) TABS tablet Take 1 tablet by mouth daily. Centrum Silver   Multiple Vitamins-Minerals (PRESERVISION AREDS 2) CAPS Take by mouth.   NON FORMULARY Diet:Heart healthy   pantoprazole (PROTONIX) 40 MG tablet Take 1 tablet (40 mg total) by mouth daily. Give one tablet by mouth twice daily for Upper GI Bleed x 8wks   polyethylene glycol powder (GLYCOLAX/MIRALAX) 17 GM/SCOOP powder Take 17 g by mouth daily.   simvastatin (ZOCOR) 20 MG tablet Take 1 tablet (20 mg total) by mouth at bedtime.   [DISCONTINUED] aspirin EC 81 MG tablet Take 81 mg by mouth daily.   [DISCONTINUED] lactulose (CHRONULAC) 10 GM/15ML solution Take 10 g by mouth 2 (two) times daily as needed for mild constipation.   [DISCONTINUED] metoprolol tartrate (LOPRESSOR) 25 MG tablet Take 0.5 tablets (12.5 mg total) by mouth 2 (two) times daily.   [DISCONTINUED] pantoprazole (PROTONIX) 40 MG tablet Take 40 mg by mouth daily. Give one tablet by mouth twice daily for Upper GI Bleed x 8wks   [DISCONTINUED] polyethylene glycol powder (GLYCOLAX/MIRALAX) powder Take 17 g by mouth daily as needed for mild constipation or moderate constipation.   [DISCONTINUED] simvastatin (ZOCOR) 20 MG tablet TAKE 1 TABLET AT BEDTIME   No facility-administered encounter medications on file as of 09/23/2021.    Review of Systems  GENERAL: No change in appetite, no fatigue, no weight changes, no fever, chills or weakness MOUTH and THROAT: Denies oral discomfort, gingival pain or bleeding RESPIRATORY: no cough, SOB, DOE, wheezing,  hemoptysis CARDIAC: No chest pain, edema or palpitations GI: No abdominal pain, diarrhea, constipation, heart burn, nausea or vomiting NEUROLOGICAL: Denies dizziness, syncope, numbness, or headache PSYCHIATRIC: Denies feelings of depression or anxiety. No report of hallucinations, insomnia, paranoia, or agitation   Immunization History  Administered Date(s) Administered   H1N1 10/30/2015   Influenza, High Dose Seasonal PF 10/16/2020   Influenza-Unspecified 10/28/2014, 09/27/2018   Moderna Sars-Covid-2 Vaccination 01/09/2020, 02/09/2020, 12/12/2020   Pneumococcal Polysaccharide-23 11/15/1995, 10/19/2006   Tdap 01/12/2016   Pertinent  Health Maintenance Due  Topic Date Due   INFLUENZA VACCINE  07/28/2021   No flowsheet data found.   Vitals:   09/23/21 1526  BP: 124/74  Pulse: 70  Temp: 97.7 F (36.5 C)  Weight: 233 lb 6.4 oz (105.9 kg)  Height: 5\' 7"  (1.702 m)   Body mass index is 36.56 kg/m.  Physical Exam  GENERAL APPEARANCE: Well nourished. In no acute distress. Obese. SKIN:  Skin is warm and dry.  MOUTH and THROAT: Lips are  without lesions. Oral mucosa is moist and without lesions.  RESPIRATORY: Breathing is even & unlabored, BS CTAB CARDIAC: no murmur,no extra heart sounds, RLE 2+ edema GI: Abdomen soft, normal BS, no masses, no tenderness GU:  has foley catheter draining to urine bag with clear yellowish urine NEUROLOGICAL: There is no tremor. Speech is clear. Alert and oriented X 3. PSYCHIATRIC:  Affect and behavior are appropriate  Labs reviewed: Recent Labs    07/14/21 0523 07/15/21 0441 07/19/21 0603 07/22/21 0400 07/23/21 0614 07/26/21 0159 07/27/21 0014 07/28/21 0104 08/20/21 0000 09/05/21 0000  NA 136 135   < > 136   < > 134* 136 137 140 141  K 4.5 4.5   < > 4.5   < > 4.3 4.4 4.2 4.7 4.6  CL 108 108   < > 106   < > 105 104 103 105 107  CO2 23 22   < > 23   < > 24 25 27 20  25*  GLUCOSE 99 100*   < > 106*   < > 104* 119* 103*  --   --   BUN  35* 32*   < > 73*   < > 35* 32* 30* 31* 19  CREATININE 1.23 1.22   < > 1.66*   < > 1.64* 1.59* 1.70* 1.5* 1.3  CALCIUM 8.2* 8.0*   < > 8.0*   < > 8.0* 8.1* 8.1* 9.3 9.1  MG 2.1 1.9  --  2.3  --   --   --   --   --   --    < > = values in this interval not displayed.   Recent Labs    07/15/21 0441 07/19/21 0603 07/20/21 0017 08/20/21 0000  AST 45* 62* 56* 16  ALT 55* 82* 74* 10  ALKPHOS 38 43 36*  --   BILITOT 0.5 0.7 0.7  --   PROT 4.9* 5.1* 4.5*  --   ALBUMIN 2.3* 2.3* 2.2* 3.8   Recent Labs    07/19/21 0603 07/19/21 0643 07/20/21 0017 07/20/21 0703 07/20/21 1053 07/26/21 0159 07/27/21 0014 07/28/21 0104 08/01/21 0000 08/05/21 0000 08/20/21 0000  WBC 14.7*  --  9.3 11.7*   < > 9.7 6.9 6.0 5.9 5.0 9.0  NEUTROABS 12.5*  --  6.8 9.0*  --   --   --   --   --   --   --   HGB 7.8*   < > 8.3* 8.8*   < > 8.5* 8.7* 8.7* 10.5* 10.0* 11.3*  HCT 25.4*   < > 23.7* 25.3*   < > 25.6* 26.3* 26.8* 32* 31* 34*  MCV 103.7*  --  90.1 92.3   < > 93.8 94.9 95.4  --   --   --   PLT 223  --  133* 159   < > 149* 165 196 220 170 153   < > = values in this interval not displayed.   Lab Results  Component Value Date   TSH 1.278 07/11/2021   No results found for: HGBA1C Lab Results  Component Value Date   CHOL 121 06/15/2014   HDL 42 06/15/2014   LDLCALC 67 06/15/2014   TRIG 59 06/15/2014   CHOLHDL 2.9 06/15/2014    Significant Diagnostic Results in last 30 days:  No results found.  Assessment/Plan  1. Upper GI bleed -   Recently completed Protonix 40 mg twice a day x8 weeks then Protonix 40 mg daily -  hgb  10.4, 09/05/21 - pantoprazole (PROTONIX) 40 MG tablet; Take 1 tablet (40 mg total) by mouth daily. Give one tablet by mouth twice daily for Upper GI Bleed x 8wks  Dispense: 30 tablet; Refill: 0  2. PAF (paroxysmal atrial fibrillation) (HCC) -    Not an anticoagulation candidate given his current GI bleed with multiple transfusions and history of falls, Plavix was discontinued  in the hospital - aspirin EC 81 MG tablet; Take 1 tablet (81 mg total) by mouth daily.  Dispense: 30 tablet; Refill: 0 - metoprolol tartrate (LOPRESSOR) 25 MG tablet; Take 0.5 tablets (12.5 mg total) by mouth 2 (two) times daily.  Dispense: 60 tablet; Refill: 0  3. Acute cystitis without hematuria -   Recently completed Macrobid x7 days -   Resolved  4. Coronary artery disease involving native coronary artery of native heart without angina pectoris -  on ASA EC 81 mg daily - simvastatin (ZOCOR) 20 MG tablet; Take 1 tablet (20 mg total) by mouth at bedtime.  Dispense: 30 tablet; Refill: 0  5. Neurogenic bladder -   Has chronic indwelling Foley catheter -   Catheter care daily  6. Slow transit constipation - lactulose (CHRONULAC) 10 GM/15ML solution; Take 15 mLs (10 g total) by mouth 2 (two) times daily as needed for mild constipation.  Dispense: 236 mL; Refill: 0 - polyethylene glycol powder (GLYCOLAX/MIRALAX) 17 GM/SCOOP powder; Take 17 g by mouth daily.  Dispense: 255 g; Refill: 1  7.  Generalized weakness -   For home health PT and OT, for therapeutic strengthening exercises -   Fall precautions     I have filled out patient's discharge paperwork and e-prescribed medications.  Patient will have home health PT and OT.  DME provided: Wheelchair, 3- in- 1 and [place]  Wheelchair  -patient suffers from generalized weakness which impairs his ability to perform daily activities like toileting, feeding, dressing, grooming and bathing in the home.  A cane or [place] will not resolve the issue with performing activities of daily living.  A wheelchair will allow patient to safely perform daily activities.  Patient can safely propel the wheelchair in the home.  Total discharge time: Greater than 30 minutes  Discharge time involved coordination of the discharge process with social worker, nursing staff and therapy department. Medical justification for home health services/DME  verified.    Durenda Age, DNP, MSN, FNP-BC [facility] and Adult Medicine [phone] (Monday-Friday 8:00 a.m. - 5:00 p.m.) [phone] (after hours)

## 2021-09-24 ENCOUNTER — Ambulatory Visit: Payer: Medicare Other | Admitting: Urology

## 2021-09-25 DIAGNOSIS — Z9181 History of falling: Secondary | ICD-10-CM | POA: Diagnosis not present

## 2021-09-25 DIAGNOSIS — D62 Acute posthemorrhagic anemia: Secondary | ICD-10-CM | POA: Diagnosis not present

## 2021-09-25 DIAGNOSIS — H353211 Exudative age-related macular degeneration, right eye, with active choroidal neovascularization: Secondary | ICD-10-CM | POA: Diagnosis not present

## 2021-09-25 DIAGNOSIS — I480 Paroxysmal atrial fibrillation: Secondary | ICD-10-CM | POA: Diagnosis not present

## 2021-09-25 DIAGNOSIS — S71102D Unspecified open wound, left thigh, subsequent encounter: Secondary | ICD-10-CM | POA: Diagnosis not present

## 2021-09-25 DIAGNOSIS — M6282 Rhabdomyolysis: Secondary | ICD-10-CM | POA: Diagnosis not present

## 2021-09-25 DIAGNOSIS — S31000D Unspecified open wound of lower back and pelvis without penetration into retroperitoneum, subsequent encounter: Secondary | ICD-10-CM | POA: Diagnosis not present

## 2021-09-25 DIAGNOSIS — N319 Neuromuscular dysfunction of bladder, unspecified: Secondary | ICD-10-CM | POA: Diagnosis not present

## 2021-09-25 DIAGNOSIS — K922 Gastrointestinal hemorrhage, unspecified: Secondary | ICD-10-CM | POA: Diagnosis not present

## 2021-09-25 DIAGNOSIS — I2510 Atherosclerotic heart disease of native coronary artery without angina pectoris: Secondary | ICD-10-CM | POA: Diagnosis not present

## 2021-09-25 DIAGNOSIS — N179 Acute kidney failure, unspecified: Secondary | ICD-10-CM | POA: Diagnosis not present

## 2021-09-25 DIAGNOSIS — S51802D Unspecified open wound of left forearm, subsequent encounter: Secondary | ICD-10-CM | POA: Diagnosis not present

## 2021-09-25 DIAGNOSIS — E785 Hyperlipidemia, unspecified: Secondary | ICD-10-CM | POA: Diagnosis not present

## 2021-09-25 DIAGNOSIS — R338 Other retention of urine: Secondary | ICD-10-CM | POA: Diagnosis not present

## 2021-09-25 DIAGNOSIS — I739 Peripheral vascular disease, unspecified: Secondary | ICD-10-CM | POA: Diagnosis not present

## 2021-09-25 DIAGNOSIS — Z466 Encounter for fitting and adjustment of urinary device: Secondary | ICD-10-CM | POA: Diagnosis not present

## 2021-09-29 DIAGNOSIS — S71112A Laceration without foreign body, left thigh, initial encounter: Secondary | ICD-10-CM | POA: Diagnosis not present

## 2021-09-29 DIAGNOSIS — M6282 Rhabdomyolysis: Secondary | ICD-10-CM | POA: Diagnosis not present

## 2021-09-29 DIAGNOSIS — K5901 Slow transit constipation: Secondary | ICD-10-CM | POA: Diagnosis not present

## 2021-09-29 DIAGNOSIS — Z466 Encounter for fitting and adjustment of urinary device: Secondary | ICD-10-CM | POA: Diagnosis not present

## 2021-09-29 DIAGNOSIS — I10 Essential (primary) hypertension: Secondary | ICD-10-CM | POA: Diagnosis not present

## 2021-09-29 DIAGNOSIS — K922 Gastrointestinal hemorrhage, unspecified: Secondary | ICD-10-CM | POA: Diagnosis not present

## 2021-09-29 DIAGNOSIS — L97429 Non-pressure chronic ulcer of left heel and midfoot with unspecified severity: Secondary | ICD-10-CM | POA: Diagnosis not present

## 2021-09-29 DIAGNOSIS — E782 Mixed hyperlipidemia: Secondary | ICD-10-CM | POA: Diagnosis not present

## 2021-09-29 DIAGNOSIS — N179 Acute kidney failure, unspecified: Secondary | ICD-10-CM | POA: Diagnosis not present

## 2021-09-29 DIAGNOSIS — N319 Neuromuscular dysfunction of bladder, unspecified: Secondary | ICD-10-CM | POA: Diagnosis not present

## 2021-09-29 DIAGNOSIS — D62 Acute posthemorrhagic anemia: Secondary | ICD-10-CM | POA: Diagnosis not present

## 2021-10-02 DIAGNOSIS — N319 Neuromuscular dysfunction of bladder, unspecified: Secondary | ICD-10-CM | POA: Diagnosis not present

## 2021-10-02 DIAGNOSIS — K922 Gastrointestinal hemorrhage, unspecified: Secondary | ICD-10-CM | POA: Diagnosis not present

## 2021-10-02 DIAGNOSIS — D62 Acute posthemorrhagic anemia: Secondary | ICD-10-CM | POA: Diagnosis not present

## 2021-10-02 DIAGNOSIS — M6282 Rhabdomyolysis: Secondary | ICD-10-CM | POA: Diagnosis not present

## 2021-10-02 DIAGNOSIS — Z466 Encounter for fitting and adjustment of urinary device: Secondary | ICD-10-CM | POA: Diagnosis not present

## 2021-10-02 DIAGNOSIS — N179 Acute kidney failure, unspecified: Secondary | ICD-10-CM | POA: Diagnosis not present

## 2021-10-05 DIAGNOSIS — D62 Acute posthemorrhagic anemia: Secondary | ICD-10-CM | POA: Diagnosis not present

## 2021-10-05 DIAGNOSIS — Z466 Encounter for fitting and adjustment of urinary device: Secondary | ICD-10-CM | POA: Diagnosis not present

## 2021-10-05 DIAGNOSIS — M6282 Rhabdomyolysis: Secondary | ICD-10-CM | POA: Diagnosis not present

## 2021-10-05 DIAGNOSIS — N319 Neuromuscular dysfunction of bladder, unspecified: Secondary | ICD-10-CM | POA: Diagnosis not present

## 2021-10-05 DIAGNOSIS — K922 Gastrointestinal hemorrhage, unspecified: Secondary | ICD-10-CM | POA: Diagnosis not present

## 2021-10-05 DIAGNOSIS — N179 Acute kidney failure, unspecified: Secondary | ICD-10-CM | POA: Diagnosis not present

## 2021-10-06 DIAGNOSIS — N179 Acute kidney failure, unspecified: Secondary | ICD-10-CM | POA: Diagnosis not present

## 2021-10-06 DIAGNOSIS — Z466 Encounter for fitting and adjustment of urinary device: Secondary | ICD-10-CM | POA: Diagnosis not present

## 2021-10-06 DIAGNOSIS — N319 Neuromuscular dysfunction of bladder, unspecified: Secondary | ICD-10-CM | POA: Diagnosis not present

## 2021-10-06 DIAGNOSIS — K922 Gastrointestinal hemorrhage, unspecified: Secondary | ICD-10-CM | POA: Diagnosis not present

## 2021-10-06 DIAGNOSIS — M6282 Rhabdomyolysis: Secondary | ICD-10-CM | POA: Diagnosis not present

## 2021-10-06 DIAGNOSIS — D62 Acute posthemorrhagic anemia: Secondary | ICD-10-CM | POA: Diagnosis not present

## 2021-10-08 DIAGNOSIS — D62 Acute posthemorrhagic anemia: Secondary | ICD-10-CM | POA: Diagnosis not present

## 2021-10-08 DIAGNOSIS — N179 Acute kidney failure, unspecified: Secondary | ICD-10-CM | POA: Diagnosis not present

## 2021-10-08 DIAGNOSIS — M6282 Rhabdomyolysis: Secondary | ICD-10-CM | POA: Diagnosis not present

## 2021-10-08 DIAGNOSIS — K922 Gastrointestinal hemorrhage, unspecified: Secondary | ICD-10-CM | POA: Diagnosis not present

## 2021-10-08 DIAGNOSIS — Z466 Encounter for fitting and adjustment of urinary device: Secondary | ICD-10-CM | POA: Diagnosis not present

## 2021-10-08 DIAGNOSIS — N319 Neuromuscular dysfunction of bladder, unspecified: Secondary | ICD-10-CM | POA: Diagnosis not present

## 2021-10-10 DIAGNOSIS — N319 Neuromuscular dysfunction of bladder, unspecified: Secondary | ICD-10-CM | POA: Diagnosis not present

## 2021-10-10 DIAGNOSIS — N179 Acute kidney failure, unspecified: Secondary | ICD-10-CM | POA: Diagnosis not present

## 2021-10-10 DIAGNOSIS — K922 Gastrointestinal hemorrhage, unspecified: Secondary | ICD-10-CM | POA: Diagnosis not present

## 2021-10-10 DIAGNOSIS — D62 Acute posthemorrhagic anemia: Secondary | ICD-10-CM | POA: Diagnosis not present

## 2021-10-10 DIAGNOSIS — M6282 Rhabdomyolysis: Secondary | ICD-10-CM | POA: Diagnosis not present

## 2021-10-10 DIAGNOSIS — Z466 Encounter for fitting and adjustment of urinary device: Secondary | ICD-10-CM | POA: Diagnosis not present

## 2021-10-13 DIAGNOSIS — N319 Neuromuscular dysfunction of bladder, unspecified: Secondary | ICD-10-CM | POA: Diagnosis not present

## 2021-10-13 DIAGNOSIS — Z466 Encounter for fitting and adjustment of urinary device: Secondary | ICD-10-CM | POA: Diagnosis not present

## 2021-10-13 DIAGNOSIS — K922 Gastrointestinal hemorrhage, unspecified: Secondary | ICD-10-CM | POA: Diagnosis not present

## 2021-10-13 DIAGNOSIS — N179 Acute kidney failure, unspecified: Secondary | ICD-10-CM | POA: Diagnosis not present

## 2021-10-13 DIAGNOSIS — D62 Acute posthemorrhagic anemia: Secondary | ICD-10-CM | POA: Diagnosis not present

## 2021-10-13 DIAGNOSIS — M6282 Rhabdomyolysis: Secondary | ICD-10-CM | POA: Diagnosis not present

## 2021-10-15 DIAGNOSIS — Z23 Encounter for immunization: Secondary | ICD-10-CM | POA: Diagnosis not present

## 2021-10-16 DIAGNOSIS — D62 Acute posthemorrhagic anemia: Secondary | ICD-10-CM | POA: Diagnosis not present

## 2021-10-16 DIAGNOSIS — K922 Gastrointestinal hemorrhage, unspecified: Secondary | ICD-10-CM | POA: Diagnosis not present

## 2021-10-16 DIAGNOSIS — Z466 Encounter for fitting and adjustment of urinary device: Secondary | ICD-10-CM | POA: Diagnosis not present

## 2021-10-16 DIAGNOSIS — M6282 Rhabdomyolysis: Secondary | ICD-10-CM | POA: Diagnosis not present

## 2021-10-16 DIAGNOSIS — N319 Neuromuscular dysfunction of bladder, unspecified: Secondary | ICD-10-CM | POA: Diagnosis not present

## 2021-10-16 DIAGNOSIS — N179 Acute kidney failure, unspecified: Secondary | ICD-10-CM | POA: Diagnosis not present

## 2021-10-20 DIAGNOSIS — N179 Acute kidney failure, unspecified: Secondary | ICD-10-CM | POA: Diagnosis not present

## 2021-10-20 DIAGNOSIS — D62 Acute posthemorrhagic anemia: Secondary | ICD-10-CM | POA: Diagnosis not present

## 2021-10-20 DIAGNOSIS — M6282 Rhabdomyolysis: Secondary | ICD-10-CM | POA: Diagnosis not present

## 2021-10-20 DIAGNOSIS — N319 Neuromuscular dysfunction of bladder, unspecified: Secondary | ICD-10-CM | POA: Diagnosis not present

## 2021-10-20 DIAGNOSIS — K922 Gastrointestinal hemorrhage, unspecified: Secondary | ICD-10-CM | POA: Diagnosis not present

## 2021-10-20 DIAGNOSIS — Z466 Encounter for fitting and adjustment of urinary device: Secondary | ICD-10-CM | POA: Diagnosis not present

## 2021-10-22 DIAGNOSIS — M6282 Rhabdomyolysis: Secondary | ICD-10-CM | POA: Diagnosis not present

## 2021-10-22 DIAGNOSIS — K922 Gastrointestinal hemorrhage, unspecified: Secondary | ICD-10-CM | POA: Diagnosis not present

## 2021-10-22 DIAGNOSIS — D62 Acute posthemorrhagic anemia: Secondary | ICD-10-CM | POA: Diagnosis not present

## 2021-10-22 DIAGNOSIS — N179 Acute kidney failure, unspecified: Secondary | ICD-10-CM | POA: Diagnosis not present

## 2021-10-22 DIAGNOSIS — N319 Neuromuscular dysfunction of bladder, unspecified: Secondary | ICD-10-CM | POA: Diagnosis not present

## 2021-10-22 DIAGNOSIS — Z466 Encounter for fitting and adjustment of urinary device: Secondary | ICD-10-CM | POA: Diagnosis not present

## 2021-10-23 DIAGNOSIS — M6282 Rhabdomyolysis: Secondary | ICD-10-CM | POA: Diagnosis not present

## 2021-10-23 DIAGNOSIS — N179 Acute kidney failure, unspecified: Secondary | ICD-10-CM | POA: Diagnosis not present

## 2021-10-23 DIAGNOSIS — K922 Gastrointestinal hemorrhage, unspecified: Secondary | ICD-10-CM | POA: Diagnosis not present

## 2021-10-23 DIAGNOSIS — D62 Acute posthemorrhagic anemia: Secondary | ICD-10-CM | POA: Diagnosis not present

## 2021-10-23 DIAGNOSIS — Z466 Encounter for fitting and adjustment of urinary device: Secondary | ICD-10-CM | POA: Diagnosis not present

## 2021-10-23 DIAGNOSIS — N319 Neuromuscular dysfunction of bladder, unspecified: Secondary | ICD-10-CM | POA: Diagnosis not present

## 2021-10-25 DIAGNOSIS — S31000D Unspecified open wound of lower back and pelvis without penetration into retroperitoneum, subsequent encounter: Secondary | ICD-10-CM | POA: Diagnosis not present

## 2021-10-25 DIAGNOSIS — I739 Peripheral vascular disease, unspecified: Secondary | ICD-10-CM | POA: Diagnosis not present

## 2021-10-25 DIAGNOSIS — N319 Neuromuscular dysfunction of bladder, unspecified: Secondary | ICD-10-CM | POA: Diagnosis not present

## 2021-10-25 DIAGNOSIS — R338 Other retention of urine: Secondary | ICD-10-CM | POA: Diagnosis not present

## 2021-10-25 DIAGNOSIS — M6282 Rhabdomyolysis: Secondary | ICD-10-CM | POA: Diagnosis not present

## 2021-10-25 DIAGNOSIS — H353211 Exudative age-related macular degeneration, right eye, with active choroidal neovascularization: Secondary | ICD-10-CM | POA: Diagnosis not present

## 2021-10-25 DIAGNOSIS — D62 Acute posthemorrhagic anemia: Secondary | ICD-10-CM | POA: Diagnosis not present

## 2021-10-25 DIAGNOSIS — I480 Paroxysmal atrial fibrillation: Secondary | ICD-10-CM | POA: Diagnosis not present

## 2021-10-25 DIAGNOSIS — Z9181 History of falling: Secondary | ICD-10-CM | POA: Diagnosis not present

## 2021-10-25 DIAGNOSIS — N179 Acute kidney failure, unspecified: Secondary | ICD-10-CM | POA: Diagnosis not present

## 2021-10-25 DIAGNOSIS — I2510 Atherosclerotic heart disease of native coronary artery without angina pectoris: Secondary | ICD-10-CM | POA: Diagnosis not present

## 2021-10-25 DIAGNOSIS — E785 Hyperlipidemia, unspecified: Secondary | ICD-10-CM | POA: Diagnosis not present

## 2021-10-25 DIAGNOSIS — S71102D Unspecified open wound, left thigh, subsequent encounter: Secondary | ICD-10-CM | POA: Diagnosis not present

## 2021-10-25 DIAGNOSIS — S51802D Unspecified open wound of left forearm, subsequent encounter: Secondary | ICD-10-CM | POA: Diagnosis not present

## 2021-10-25 DIAGNOSIS — K922 Gastrointestinal hemorrhage, unspecified: Secondary | ICD-10-CM | POA: Diagnosis not present

## 2021-10-25 DIAGNOSIS — Z466 Encounter for fitting and adjustment of urinary device: Secondary | ICD-10-CM | POA: Diagnosis not present

## 2021-10-27 DIAGNOSIS — K922 Gastrointestinal hemorrhage, unspecified: Secondary | ICD-10-CM | POA: Diagnosis not present

## 2021-10-27 DIAGNOSIS — N179 Acute kidney failure, unspecified: Secondary | ICD-10-CM | POA: Diagnosis not present

## 2021-10-27 DIAGNOSIS — D62 Acute posthemorrhagic anemia: Secondary | ICD-10-CM | POA: Diagnosis not present

## 2021-10-27 DIAGNOSIS — M6282 Rhabdomyolysis: Secondary | ICD-10-CM | POA: Diagnosis not present

## 2021-10-27 DIAGNOSIS — N319 Neuromuscular dysfunction of bladder, unspecified: Secondary | ICD-10-CM | POA: Diagnosis not present

## 2021-10-27 DIAGNOSIS — Z466 Encounter for fitting and adjustment of urinary device: Secondary | ICD-10-CM | POA: Diagnosis not present

## 2021-10-28 DIAGNOSIS — K922 Gastrointestinal hemorrhage, unspecified: Secondary | ICD-10-CM | POA: Diagnosis not present

## 2021-10-28 DIAGNOSIS — N319 Neuromuscular dysfunction of bladder, unspecified: Secondary | ICD-10-CM | POA: Diagnosis not present

## 2021-10-28 DIAGNOSIS — N179 Acute kidney failure, unspecified: Secondary | ICD-10-CM | POA: Diagnosis not present

## 2021-10-28 DIAGNOSIS — M6282 Rhabdomyolysis: Secondary | ICD-10-CM | POA: Diagnosis not present

## 2021-10-28 DIAGNOSIS — Z466 Encounter for fitting and adjustment of urinary device: Secondary | ICD-10-CM | POA: Diagnosis not present

## 2021-10-28 DIAGNOSIS — D62 Acute posthemorrhagic anemia: Secondary | ICD-10-CM | POA: Diagnosis not present

## 2021-10-30 DIAGNOSIS — M6282 Rhabdomyolysis: Secondary | ICD-10-CM | POA: Diagnosis not present

## 2021-10-30 DIAGNOSIS — Z466 Encounter for fitting and adjustment of urinary device: Secondary | ICD-10-CM | POA: Diagnosis not present

## 2021-10-30 DIAGNOSIS — K922 Gastrointestinal hemorrhage, unspecified: Secondary | ICD-10-CM | POA: Diagnosis not present

## 2021-10-30 DIAGNOSIS — D62 Acute posthemorrhagic anemia: Secondary | ICD-10-CM | POA: Diagnosis not present

## 2021-10-30 DIAGNOSIS — N319 Neuromuscular dysfunction of bladder, unspecified: Secondary | ICD-10-CM | POA: Diagnosis not present

## 2021-10-30 DIAGNOSIS — N179 Acute kidney failure, unspecified: Secondary | ICD-10-CM | POA: Diagnosis not present

## 2021-11-03 DIAGNOSIS — R600 Localized edema: Secondary | ICD-10-CM | POA: Diagnosis not present

## 2021-11-04 DIAGNOSIS — D62 Acute posthemorrhagic anemia: Secondary | ICD-10-CM | POA: Diagnosis not present

## 2021-11-04 DIAGNOSIS — M6282 Rhabdomyolysis: Secondary | ICD-10-CM | POA: Diagnosis not present

## 2021-11-04 DIAGNOSIS — N319 Neuromuscular dysfunction of bladder, unspecified: Secondary | ICD-10-CM | POA: Diagnosis not present

## 2021-11-04 DIAGNOSIS — K922 Gastrointestinal hemorrhage, unspecified: Secondary | ICD-10-CM | POA: Diagnosis not present

## 2021-11-04 DIAGNOSIS — N179 Acute kidney failure, unspecified: Secondary | ICD-10-CM | POA: Diagnosis not present

## 2021-11-04 DIAGNOSIS — Z466 Encounter for fitting and adjustment of urinary device: Secondary | ICD-10-CM | POA: Diagnosis not present

## 2021-11-06 DIAGNOSIS — N319 Neuromuscular dysfunction of bladder, unspecified: Secondary | ICD-10-CM | POA: Diagnosis not present

## 2021-11-06 DIAGNOSIS — N179 Acute kidney failure, unspecified: Secondary | ICD-10-CM | POA: Diagnosis not present

## 2021-11-06 DIAGNOSIS — M6282 Rhabdomyolysis: Secondary | ICD-10-CM | POA: Diagnosis not present

## 2021-11-06 DIAGNOSIS — K922 Gastrointestinal hemorrhage, unspecified: Secondary | ICD-10-CM | POA: Diagnosis not present

## 2021-11-06 DIAGNOSIS — Z466 Encounter for fitting and adjustment of urinary device: Secondary | ICD-10-CM | POA: Diagnosis not present

## 2021-11-06 DIAGNOSIS — D62 Acute posthemorrhagic anemia: Secondary | ICD-10-CM | POA: Diagnosis not present

## 2021-11-10 DIAGNOSIS — D62 Acute posthemorrhagic anemia: Secondary | ICD-10-CM | POA: Diagnosis not present

## 2021-11-10 DIAGNOSIS — N179 Acute kidney failure, unspecified: Secondary | ICD-10-CM | POA: Diagnosis not present

## 2021-11-10 DIAGNOSIS — K922 Gastrointestinal hemorrhage, unspecified: Secondary | ICD-10-CM | POA: Diagnosis not present

## 2021-11-10 DIAGNOSIS — N319 Neuromuscular dysfunction of bladder, unspecified: Secondary | ICD-10-CM | POA: Diagnosis not present

## 2021-11-10 DIAGNOSIS — M6282 Rhabdomyolysis: Secondary | ICD-10-CM | POA: Diagnosis not present

## 2021-11-10 DIAGNOSIS — Z466 Encounter for fitting and adjustment of urinary device: Secondary | ICD-10-CM | POA: Diagnosis not present

## 2021-11-12 ENCOUNTER — Other Ambulatory Visit (HOSPITAL_COMMUNITY)
Admission: RE | Admit: 2021-11-12 | Discharge: 2021-11-12 | Disposition: A | Discharge status: Home / Self Care | Payer: Medicare Other | Source: Other Acute Inpatient Hospital | Attending: Nurse Practitioner | Admitting: Nurse Practitioner

## 2021-11-12 DIAGNOSIS — M6282 Rhabdomyolysis: Secondary | ICD-10-CM | POA: Diagnosis not present

## 2021-11-12 DIAGNOSIS — Z029 Encounter for administrative examinations, unspecified: Secondary | ICD-10-CM | POA: Insufficient documentation

## 2021-11-12 DIAGNOSIS — N179 Acute kidney failure, unspecified: Secondary | ICD-10-CM | POA: Diagnosis not present

## 2021-11-12 DIAGNOSIS — Z466 Encounter for fitting and adjustment of urinary device: Secondary | ICD-10-CM | POA: Diagnosis not present

## 2021-11-12 DIAGNOSIS — K922 Gastrointestinal hemorrhage, unspecified: Secondary | ICD-10-CM | POA: Diagnosis not present

## 2021-11-12 DIAGNOSIS — N319 Neuromuscular dysfunction of bladder, unspecified: Secondary | ICD-10-CM | POA: Diagnosis not present

## 2021-11-12 DIAGNOSIS — I480 Paroxysmal atrial fibrillation: Secondary | ICD-10-CM | POA: Diagnosis not present

## 2021-11-12 DIAGNOSIS — I2510 Atherosclerotic heart disease of native coronary artery without angina pectoris: Secondary | ICD-10-CM | POA: Diagnosis not present

## 2021-11-12 DIAGNOSIS — D62 Acute posthemorrhagic anemia: Secondary | ICD-10-CM | POA: Diagnosis not present

## 2021-11-12 LAB — CBC WITH DIFFERENTIAL/PLATELET
Abs Immature Granulocytes: 0.01 10*3/uL (ref 0.00–0.07)
Basophils Absolute: 0 10*3/uL (ref 0.0–0.1)
Basophils Relative: 1 %
Eosinophils Absolute: 0.1 10*3/uL (ref 0.0–0.5)
Eosinophils Relative: 1 %
HCT: 33.4 % — ABNORMAL LOW (ref 39.0–52.0)
Hemoglobin: 10.7 g/dL — ABNORMAL LOW (ref 13.0–17.0)
Immature Granulocytes: 0 %
Lymphocytes Relative: 21 %
Lymphs Abs: 1.4 10*3/uL (ref 0.7–4.0)
MCH: 31.8 pg (ref 26.0–34.0)
MCHC: 32 g/dL (ref 30.0–36.0)
MCV: 99.1 fL (ref 80.0–100.0)
Monocytes Absolute: 0.8 10*3/uL (ref 0.1–1.0)
Monocytes Relative: 12 %
Neutro Abs: 4.1 10*3/uL (ref 1.7–7.7)
Neutrophils Relative %: 65 %
Platelets: 174 10*3/uL (ref 150–400)
RBC: 3.37 MIL/uL — ABNORMAL LOW (ref 4.22–5.81)
RDW: 16 % — ABNORMAL HIGH (ref 11.5–15.5)
WBC: 6.4 10*3/uL (ref 4.0–10.5)
nRBC: 0 % (ref 0.0–0.2)

## 2021-11-12 LAB — COMPREHENSIVE METABOLIC PANEL
ALT: 15 U/L (ref 0–44)
AST: 19 U/L (ref 15–41)
Albumin: 3.8 g/dL (ref 3.5–5.0)
Alkaline Phosphatase: 53 U/L (ref 38–126)
Anion gap: 7 (ref 5–15)
BUN: 27 mg/dL — ABNORMAL HIGH (ref 8–23)
CO2: 26 mmol/L (ref 22–32)
Calcium: 9.1 mg/dL (ref 8.9–10.3)
Chloride: 106 mmol/L (ref 98–111)
Creatinine, Ser: 1.43 mg/dL — ABNORMAL HIGH (ref 0.61–1.24)
GFR, Estimated: 45 mL/min — ABNORMAL LOW (ref >60)
Glucose, Bld: 97 mg/dL (ref 70–99)
Potassium: 4.6 mmol/L (ref 3.5–5.1)
Sodium: 139 mmol/L (ref 135–145)
Total Bilirubin: 0.6 mg/dL (ref 0.3–1.2)
Total Protein: 6.4 g/dL — ABNORMAL LOW (ref 6.5–8.1)

## 2021-11-12 LAB — HEMOGLOBIN A1C
Hgb A1c MFr Bld: 5.3 % (ref 4.8–5.6)
Mean Plasma Glucose: 105.41 mg/dL

## 2021-11-14 DIAGNOSIS — N179 Acute kidney failure, unspecified: Secondary | ICD-10-CM | POA: Diagnosis not present

## 2021-11-14 DIAGNOSIS — D62 Acute posthemorrhagic anemia: Secondary | ICD-10-CM | POA: Diagnosis not present

## 2021-11-14 DIAGNOSIS — N319 Neuromuscular dysfunction of bladder, unspecified: Secondary | ICD-10-CM | POA: Diagnosis not present

## 2021-11-14 DIAGNOSIS — K922 Gastrointestinal hemorrhage, unspecified: Secondary | ICD-10-CM | POA: Diagnosis not present

## 2021-11-14 DIAGNOSIS — Z466 Encounter for fitting and adjustment of urinary device: Secondary | ICD-10-CM | POA: Diagnosis not present

## 2021-11-14 DIAGNOSIS — M6282 Rhabdomyolysis: Secondary | ICD-10-CM | POA: Diagnosis not present

## 2021-11-16 DIAGNOSIS — M6282 Rhabdomyolysis: Secondary | ICD-10-CM | POA: Diagnosis not present

## 2021-11-16 DIAGNOSIS — N179 Acute kidney failure, unspecified: Secondary | ICD-10-CM | POA: Diagnosis not present

## 2021-11-16 DIAGNOSIS — D62 Acute posthemorrhagic anemia: Secondary | ICD-10-CM | POA: Diagnosis not present

## 2021-11-16 DIAGNOSIS — K922 Gastrointestinal hemorrhage, unspecified: Secondary | ICD-10-CM | POA: Diagnosis not present

## 2021-11-16 DIAGNOSIS — N319 Neuromuscular dysfunction of bladder, unspecified: Secondary | ICD-10-CM | POA: Diagnosis not present

## 2021-11-16 DIAGNOSIS — Z466 Encounter for fitting and adjustment of urinary device: Secondary | ICD-10-CM | POA: Diagnosis not present

## 2021-11-17 DIAGNOSIS — M6282 Rhabdomyolysis: Secondary | ICD-10-CM | POA: Diagnosis not present

## 2021-11-17 DIAGNOSIS — Z466 Encounter for fitting and adjustment of urinary device: Secondary | ICD-10-CM | POA: Diagnosis not present

## 2021-11-17 DIAGNOSIS — N179 Acute kidney failure, unspecified: Secondary | ICD-10-CM | POA: Diagnosis not present

## 2021-11-17 DIAGNOSIS — K922 Gastrointestinal hemorrhage, unspecified: Secondary | ICD-10-CM | POA: Diagnosis not present

## 2021-11-17 DIAGNOSIS — D62 Acute posthemorrhagic anemia: Secondary | ICD-10-CM | POA: Diagnosis not present

## 2021-11-17 DIAGNOSIS — N319 Neuromuscular dysfunction of bladder, unspecified: Secondary | ICD-10-CM | POA: Diagnosis not present

## 2021-11-18 DIAGNOSIS — N179 Acute kidney failure, unspecified: Secondary | ICD-10-CM | POA: Diagnosis not present

## 2021-11-18 DIAGNOSIS — Z466 Encounter for fitting and adjustment of urinary device: Secondary | ICD-10-CM | POA: Diagnosis not present

## 2021-11-18 DIAGNOSIS — K922 Gastrointestinal hemorrhage, unspecified: Secondary | ICD-10-CM | POA: Diagnosis not present

## 2021-11-18 DIAGNOSIS — M6282 Rhabdomyolysis: Secondary | ICD-10-CM | POA: Diagnosis not present

## 2021-11-18 DIAGNOSIS — N319 Neuromuscular dysfunction of bladder, unspecified: Secondary | ICD-10-CM | POA: Diagnosis not present

## 2021-11-18 DIAGNOSIS — D62 Acute posthemorrhagic anemia: Secondary | ICD-10-CM | POA: Diagnosis not present

## 2021-11-19 DIAGNOSIS — D62 Acute posthemorrhagic anemia: Secondary | ICD-10-CM | POA: Diagnosis not present

## 2021-11-19 DIAGNOSIS — N179 Acute kidney failure, unspecified: Secondary | ICD-10-CM | POA: Diagnosis not present

## 2021-11-19 DIAGNOSIS — K922 Gastrointestinal hemorrhage, unspecified: Secondary | ICD-10-CM | POA: Diagnosis not present

## 2021-11-19 DIAGNOSIS — Z466 Encounter for fitting and adjustment of urinary device: Secondary | ICD-10-CM | POA: Diagnosis not present

## 2021-11-19 DIAGNOSIS — M6282 Rhabdomyolysis: Secondary | ICD-10-CM | POA: Diagnosis not present

## 2021-11-19 DIAGNOSIS — N319 Neuromuscular dysfunction of bladder, unspecified: Secondary | ICD-10-CM | POA: Diagnosis not present

## 2021-11-24 DIAGNOSIS — I2510 Atherosclerotic heart disease of native coronary artery without angina pectoris: Secondary | ICD-10-CM | POA: Diagnosis not present

## 2021-11-24 DIAGNOSIS — N319 Neuromuscular dysfunction of bladder, unspecified: Secondary | ICD-10-CM | POA: Diagnosis not present

## 2021-11-24 DIAGNOSIS — I10 Essential (primary) hypertension: Secondary | ICD-10-CM | POA: Diagnosis not present

## 2021-11-24 DIAGNOSIS — Z9181 History of falling: Secondary | ICD-10-CM | POA: Diagnosis not present

## 2021-11-24 DIAGNOSIS — R600 Localized edema: Secondary | ICD-10-CM | POA: Diagnosis not present

## 2021-11-24 DIAGNOSIS — Z466 Encounter for fitting and adjustment of urinary device: Secondary | ICD-10-CM | POA: Diagnosis not present

## 2021-11-24 DIAGNOSIS — M6282 Rhabdomyolysis: Secondary | ICD-10-CM | POA: Diagnosis not present

## 2021-11-24 DIAGNOSIS — E785 Hyperlipidemia, unspecified: Secondary | ICD-10-CM | POA: Diagnosis not present

## 2021-11-24 DIAGNOSIS — N1831 Chronic kidney disease, stage 3a: Secondary | ICD-10-CM | POA: Diagnosis not present

## 2021-11-24 DIAGNOSIS — I480 Paroxysmal atrial fibrillation: Secondary | ICD-10-CM | POA: Diagnosis not present

## 2021-11-24 DIAGNOSIS — H353211 Exudative age-related macular degeneration, right eye, with active choroidal neovascularization: Secondary | ICD-10-CM | POA: Diagnosis not present

## 2021-11-24 DIAGNOSIS — R338 Other retention of urine: Secondary | ICD-10-CM | POA: Diagnosis not present

## 2021-11-24 DIAGNOSIS — I739 Peripheral vascular disease, unspecified: Secondary | ICD-10-CM | POA: Diagnosis not present

## 2021-11-24 DIAGNOSIS — E782 Mixed hyperlipidemia: Secondary | ICD-10-CM | POA: Diagnosis not present

## 2021-11-24 DIAGNOSIS — I129 Hypertensive chronic kidney disease with stage 1 through stage 4 chronic kidney disease, or unspecified chronic kidney disease: Secondary | ICD-10-CM | POA: Diagnosis not present

## 2021-11-24 DIAGNOSIS — K922 Gastrointestinal hemorrhage, unspecified: Secondary | ICD-10-CM | POA: Diagnosis not present

## 2021-11-24 DIAGNOSIS — K5901 Slow transit constipation: Secondary | ICD-10-CM | POA: Diagnosis not present

## 2021-11-24 DIAGNOSIS — D62 Acute posthemorrhagic anemia: Secondary | ICD-10-CM | POA: Diagnosis not present

## 2021-11-24 DIAGNOSIS — N179 Acute kidney failure, unspecified: Secondary | ICD-10-CM | POA: Diagnosis not present

## 2021-11-24 DIAGNOSIS — Z7982 Long term (current) use of aspirin: Secondary | ICD-10-CM | POA: Diagnosis not present

## 2021-11-24 DIAGNOSIS — S31000D Unspecified open wound of lower back and pelvis without penetration into retroperitoneum, subsequent encounter: Secondary | ICD-10-CM | POA: Diagnosis not present

## 2021-11-25 DIAGNOSIS — D62 Acute posthemorrhagic anemia: Secondary | ICD-10-CM | POA: Diagnosis not present

## 2021-11-25 DIAGNOSIS — Z466 Encounter for fitting and adjustment of urinary device: Secondary | ICD-10-CM | POA: Diagnosis not present

## 2021-11-25 DIAGNOSIS — M6282 Rhabdomyolysis: Secondary | ICD-10-CM | POA: Diagnosis not present

## 2021-11-25 DIAGNOSIS — N179 Acute kidney failure, unspecified: Secondary | ICD-10-CM | POA: Diagnosis not present

## 2021-11-25 DIAGNOSIS — N319 Neuromuscular dysfunction of bladder, unspecified: Secondary | ICD-10-CM | POA: Diagnosis not present

## 2021-11-25 DIAGNOSIS — K922 Gastrointestinal hemorrhage, unspecified: Secondary | ICD-10-CM | POA: Diagnosis not present

## 2021-11-26 DIAGNOSIS — N179 Acute kidney failure, unspecified: Secondary | ICD-10-CM | POA: Diagnosis not present

## 2021-11-26 DIAGNOSIS — M6282 Rhabdomyolysis: Secondary | ICD-10-CM | POA: Diagnosis not present

## 2021-11-26 DIAGNOSIS — Z466 Encounter for fitting and adjustment of urinary device: Secondary | ICD-10-CM | POA: Diagnosis not present

## 2021-11-26 DIAGNOSIS — N319 Neuromuscular dysfunction of bladder, unspecified: Secondary | ICD-10-CM | POA: Diagnosis not present

## 2021-11-26 DIAGNOSIS — D62 Acute posthemorrhagic anemia: Secondary | ICD-10-CM | POA: Diagnosis not present

## 2021-11-26 DIAGNOSIS — K922 Gastrointestinal hemorrhage, unspecified: Secondary | ICD-10-CM | POA: Diagnosis not present

## 2021-11-27 DIAGNOSIS — D62 Acute posthemorrhagic anemia: Secondary | ICD-10-CM | POA: Diagnosis not present

## 2021-11-27 DIAGNOSIS — N319 Neuromuscular dysfunction of bladder, unspecified: Secondary | ICD-10-CM | POA: Diagnosis not present

## 2021-11-27 DIAGNOSIS — Z466 Encounter for fitting and adjustment of urinary device: Secondary | ICD-10-CM | POA: Diagnosis not present

## 2021-11-27 DIAGNOSIS — M6282 Rhabdomyolysis: Secondary | ICD-10-CM | POA: Diagnosis not present

## 2021-11-27 DIAGNOSIS — N179 Acute kidney failure, unspecified: Secondary | ICD-10-CM | POA: Diagnosis not present

## 2021-11-27 DIAGNOSIS — K922 Gastrointestinal hemorrhage, unspecified: Secondary | ICD-10-CM | POA: Diagnosis not present

## 2021-12-01 DIAGNOSIS — M6282 Rhabdomyolysis: Secondary | ICD-10-CM | POA: Diagnosis not present

## 2021-12-01 DIAGNOSIS — D62 Acute posthemorrhagic anemia: Secondary | ICD-10-CM | POA: Diagnosis not present

## 2021-12-01 DIAGNOSIS — Z466 Encounter for fitting and adjustment of urinary device: Secondary | ICD-10-CM | POA: Diagnosis not present

## 2021-12-01 DIAGNOSIS — K922 Gastrointestinal hemorrhage, unspecified: Secondary | ICD-10-CM | POA: Diagnosis not present

## 2021-12-01 DIAGNOSIS — N319 Neuromuscular dysfunction of bladder, unspecified: Secondary | ICD-10-CM | POA: Diagnosis not present

## 2021-12-01 DIAGNOSIS — N179 Acute kidney failure, unspecified: Secondary | ICD-10-CM | POA: Diagnosis not present

## 2021-12-02 DIAGNOSIS — R2681 Unsteadiness on feet: Secondary | ICD-10-CM | POA: Diagnosis not present

## 2021-12-02 DIAGNOSIS — D62 Acute posthemorrhagic anemia: Secondary | ICD-10-CM | POA: Diagnosis not present

## 2021-12-02 DIAGNOSIS — M6282 Rhabdomyolysis: Secondary | ICD-10-CM | POA: Diagnosis not present

## 2021-12-02 DIAGNOSIS — M79674 Pain in right toe(s): Secondary | ICD-10-CM | POA: Diagnosis not present

## 2021-12-02 DIAGNOSIS — Z466 Encounter for fitting and adjustment of urinary device: Secondary | ICD-10-CM | POA: Diagnosis not present

## 2021-12-02 DIAGNOSIS — N319 Neuromuscular dysfunction of bladder, unspecified: Secondary | ICD-10-CM | POA: Diagnosis not present

## 2021-12-02 DIAGNOSIS — B351 Tinea unguium: Secondary | ICD-10-CM | POA: Diagnosis not present

## 2021-12-02 DIAGNOSIS — N179 Acute kidney failure, unspecified: Secondary | ICD-10-CM | POA: Diagnosis not present

## 2021-12-02 DIAGNOSIS — L603 Nail dystrophy: Secondary | ICD-10-CM | POA: Diagnosis not present

## 2021-12-02 DIAGNOSIS — I739 Peripheral vascular disease, unspecified: Secondary | ICD-10-CM | POA: Diagnosis not present

## 2021-12-02 DIAGNOSIS — K922 Gastrointestinal hemorrhage, unspecified: Secondary | ICD-10-CM | POA: Diagnosis not present

## 2021-12-04 DIAGNOSIS — Z466 Encounter for fitting and adjustment of urinary device: Secondary | ICD-10-CM | POA: Diagnosis not present

## 2021-12-04 DIAGNOSIS — N319 Neuromuscular dysfunction of bladder, unspecified: Secondary | ICD-10-CM | POA: Diagnosis not present

## 2021-12-04 DIAGNOSIS — M6282 Rhabdomyolysis: Secondary | ICD-10-CM | POA: Diagnosis not present

## 2021-12-04 DIAGNOSIS — K922 Gastrointestinal hemorrhage, unspecified: Secondary | ICD-10-CM | POA: Diagnosis not present

## 2021-12-04 DIAGNOSIS — N179 Acute kidney failure, unspecified: Secondary | ICD-10-CM | POA: Diagnosis not present

## 2021-12-04 DIAGNOSIS — D62 Acute posthemorrhagic anemia: Secondary | ICD-10-CM | POA: Diagnosis not present

## 2021-12-05 DIAGNOSIS — N319 Neuromuscular dysfunction of bladder, unspecified: Secondary | ICD-10-CM | POA: Diagnosis not present

## 2021-12-05 DIAGNOSIS — D62 Acute posthemorrhagic anemia: Secondary | ICD-10-CM | POA: Diagnosis not present

## 2021-12-05 DIAGNOSIS — Z466 Encounter for fitting and adjustment of urinary device: Secondary | ICD-10-CM | POA: Diagnosis not present

## 2021-12-05 DIAGNOSIS — M6282 Rhabdomyolysis: Secondary | ICD-10-CM | POA: Diagnosis not present

## 2021-12-05 DIAGNOSIS — N179 Acute kidney failure, unspecified: Secondary | ICD-10-CM | POA: Diagnosis not present

## 2021-12-05 DIAGNOSIS — K922 Gastrointestinal hemorrhage, unspecified: Secondary | ICD-10-CM | POA: Diagnosis not present

## 2021-12-08 DIAGNOSIS — N179 Acute kidney failure, unspecified: Secondary | ICD-10-CM | POA: Diagnosis not present

## 2021-12-08 DIAGNOSIS — N319 Neuromuscular dysfunction of bladder, unspecified: Secondary | ICD-10-CM | POA: Diagnosis not present

## 2021-12-08 DIAGNOSIS — Z466 Encounter for fitting and adjustment of urinary device: Secondary | ICD-10-CM | POA: Diagnosis not present

## 2021-12-08 DIAGNOSIS — M6282 Rhabdomyolysis: Secondary | ICD-10-CM | POA: Diagnosis not present

## 2021-12-08 DIAGNOSIS — K922 Gastrointestinal hemorrhage, unspecified: Secondary | ICD-10-CM | POA: Diagnosis not present

## 2021-12-08 DIAGNOSIS — D62 Acute posthemorrhagic anemia: Secondary | ICD-10-CM | POA: Diagnosis not present

## 2021-12-10 DIAGNOSIS — N1831 Chronic kidney disease, stage 3a: Secondary | ICD-10-CM | POA: Diagnosis not present

## 2021-12-10 DIAGNOSIS — E782 Mixed hyperlipidemia: Secondary | ICD-10-CM | POA: Diagnosis not present

## 2021-12-10 DIAGNOSIS — K5901 Slow transit constipation: Secondary | ICD-10-CM | POA: Diagnosis not present

## 2021-12-11 DIAGNOSIS — K922 Gastrointestinal hemorrhage, unspecified: Secondary | ICD-10-CM | POA: Diagnosis not present

## 2021-12-11 DIAGNOSIS — N179 Acute kidney failure, unspecified: Secondary | ICD-10-CM | POA: Diagnosis not present

## 2021-12-11 DIAGNOSIS — M6282 Rhabdomyolysis: Secondary | ICD-10-CM | POA: Diagnosis not present

## 2021-12-11 DIAGNOSIS — N319 Neuromuscular dysfunction of bladder, unspecified: Secondary | ICD-10-CM | POA: Diagnosis not present

## 2021-12-11 DIAGNOSIS — D62 Acute posthemorrhagic anemia: Secondary | ICD-10-CM | POA: Diagnosis not present

## 2021-12-11 DIAGNOSIS — Z466 Encounter for fitting and adjustment of urinary device: Secondary | ICD-10-CM | POA: Diagnosis not present

## 2021-12-15 DIAGNOSIS — N319 Neuromuscular dysfunction of bladder, unspecified: Secondary | ICD-10-CM | POA: Diagnosis not present

## 2021-12-15 DIAGNOSIS — E782 Mixed hyperlipidemia: Secondary | ICD-10-CM | POA: Diagnosis not present

## 2021-12-15 DIAGNOSIS — N1831 Chronic kidney disease, stage 3a: Secondary | ICD-10-CM | POA: Diagnosis not present

## 2021-12-15 DIAGNOSIS — K5901 Slow transit constipation: Secondary | ICD-10-CM | POA: Diagnosis not present

## 2021-12-15 DIAGNOSIS — R070 Pain in throat: Secondary | ICD-10-CM | POA: Diagnosis not present

## 2021-12-15 DIAGNOSIS — I129 Hypertensive chronic kidney disease with stage 1 through stage 4 chronic kidney disease, or unspecified chronic kidney disease: Secondary | ICD-10-CM | POA: Diagnosis not present

## 2021-12-15 DIAGNOSIS — R600 Localized edema: Secondary | ICD-10-CM | POA: Diagnosis not present

## 2021-12-15 DIAGNOSIS — Z466 Encounter for fitting and adjustment of urinary device: Secondary | ICD-10-CM | POA: Diagnosis not present

## 2021-12-15 DIAGNOSIS — M6282 Rhabdomyolysis: Secondary | ICD-10-CM | POA: Diagnosis not present

## 2021-12-15 DIAGNOSIS — K922 Gastrointestinal hemorrhage, unspecified: Secondary | ICD-10-CM | POA: Diagnosis not present

## 2021-12-15 DIAGNOSIS — D62 Acute posthemorrhagic anemia: Secondary | ICD-10-CM | POA: Diagnosis not present

## 2021-12-15 DIAGNOSIS — N179 Acute kidney failure, unspecified: Secondary | ICD-10-CM | POA: Diagnosis not present

## 2021-12-16 DIAGNOSIS — K922 Gastrointestinal hemorrhage, unspecified: Secondary | ICD-10-CM | POA: Diagnosis not present

## 2021-12-16 DIAGNOSIS — D62 Acute posthemorrhagic anemia: Secondary | ICD-10-CM | POA: Diagnosis not present

## 2021-12-16 DIAGNOSIS — M6282 Rhabdomyolysis: Secondary | ICD-10-CM | POA: Diagnosis not present

## 2021-12-17 DIAGNOSIS — N179 Acute kidney failure, unspecified: Secondary | ICD-10-CM | POA: Diagnosis not present

## 2021-12-17 DIAGNOSIS — D62 Acute posthemorrhagic anemia: Secondary | ICD-10-CM | POA: Diagnosis not present

## 2021-12-17 DIAGNOSIS — K922 Gastrointestinal hemorrhage, unspecified: Secondary | ICD-10-CM | POA: Diagnosis not present

## 2021-12-17 DIAGNOSIS — M6282 Rhabdomyolysis: Secondary | ICD-10-CM | POA: Diagnosis not present

## 2021-12-17 DIAGNOSIS — N319 Neuromuscular dysfunction of bladder, unspecified: Secondary | ICD-10-CM | POA: Diagnosis not present

## 2021-12-17 DIAGNOSIS — Z466 Encounter for fitting and adjustment of urinary device: Secondary | ICD-10-CM | POA: Diagnosis not present

## 2021-12-18 DIAGNOSIS — K922 Gastrointestinal hemorrhage, unspecified: Secondary | ICD-10-CM | POA: Diagnosis not present

## 2021-12-18 DIAGNOSIS — N319 Neuromuscular dysfunction of bladder, unspecified: Secondary | ICD-10-CM | POA: Diagnosis not present

## 2021-12-18 DIAGNOSIS — D62 Acute posthemorrhagic anemia: Secondary | ICD-10-CM | POA: Diagnosis not present

## 2021-12-18 DIAGNOSIS — Z466 Encounter for fitting and adjustment of urinary device: Secondary | ICD-10-CM | POA: Diagnosis not present

## 2021-12-18 DIAGNOSIS — M6282 Rhabdomyolysis: Secondary | ICD-10-CM | POA: Diagnosis not present

## 2021-12-18 DIAGNOSIS — N179 Acute kidney failure, unspecified: Secondary | ICD-10-CM | POA: Diagnosis not present

## 2021-12-22 DIAGNOSIS — Z466 Encounter for fitting and adjustment of urinary device: Secondary | ICD-10-CM | POA: Diagnosis not present

## 2021-12-22 DIAGNOSIS — D62 Acute posthemorrhagic anemia: Secondary | ICD-10-CM | POA: Diagnosis not present

## 2021-12-22 DIAGNOSIS — K922 Gastrointestinal hemorrhage, unspecified: Secondary | ICD-10-CM | POA: Diagnosis not present

## 2021-12-22 DIAGNOSIS — N319 Neuromuscular dysfunction of bladder, unspecified: Secondary | ICD-10-CM | POA: Diagnosis not present

## 2021-12-22 DIAGNOSIS — M6282 Rhabdomyolysis: Secondary | ICD-10-CM | POA: Diagnosis not present

## 2021-12-22 DIAGNOSIS — N179 Acute kidney failure, unspecified: Secondary | ICD-10-CM | POA: Diagnosis not present

## 2021-12-24 DIAGNOSIS — M6282 Rhabdomyolysis: Secondary | ICD-10-CM | POA: Diagnosis not present

## 2021-12-24 DIAGNOSIS — R338 Other retention of urine: Secondary | ICD-10-CM | POA: Diagnosis not present

## 2021-12-24 DIAGNOSIS — Z466 Encounter for fitting and adjustment of urinary device: Secondary | ICD-10-CM | POA: Diagnosis not present

## 2021-12-24 DIAGNOSIS — E785 Hyperlipidemia, unspecified: Secondary | ICD-10-CM | POA: Diagnosis not present

## 2021-12-24 DIAGNOSIS — Z9181 History of falling: Secondary | ICD-10-CM | POA: Diagnosis not present

## 2021-12-24 DIAGNOSIS — I2510 Atherosclerotic heart disease of native coronary artery without angina pectoris: Secondary | ICD-10-CM | POA: Diagnosis not present

## 2021-12-24 DIAGNOSIS — N179 Acute kidney failure, unspecified: Secondary | ICD-10-CM | POA: Diagnosis not present

## 2021-12-24 DIAGNOSIS — K922 Gastrointestinal hemorrhage, unspecified: Secondary | ICD-10-CM | POA: Diagnosis not present

## 2021-12-24 DIAGNOSIS — S31000D Unspecified open wound of lower back and pelvis without penetration into retroperitoneum, subsequent encounter: Secondary | ICD-10-CM | POA: Diagnosis not present

## 2021-12-24 DIAGNOSIS — H353211 Exudative age-related macular degeneration, right eye, with active choroidal neovascularization: Secondary | ICD-10-CM | POA: Diagnosis not present

## 2021-12-24 DIAGNOSIS — Z7982 Long term (current) use of aspirin: Secondary | ICD-10-CM | POA: Diagnosis not present

## 2021-12-24 DIAGNOSIS — N319 Neuromuscular dysfunction of bladder, unspecified: Secondary | ICD-10-CM | POA: Diagnosis not present

## 2021-12-24 DIAGNOSIS — I10 Essential (primary) hypertension: Secondary | ICD-10-CM | POA: Diagnosis not present

## 2021-12-24 DIAGNOSIS — I480 Paroxysmal atrial fibrillation: Secondary | ICD-10-CM | POA: Diagnosis not present

## 2021-12-24 DIAGNOSIS — I739 Peripheral vascular disease, unspecified: Secondary | ICD-10-CM | POA: Diagnosis not present

## 2021-12-24 DIAGNOSIS — D62 Acute posthemorrhagic anemia: Secondary | ICD-10-CM | POA: Diagnosis not present

## 2021-12-31 DIAGNOSIS — T83498A Other mechanical complication of other prosthetic devices, implants and grafts of genital tract, initial encounter: Secondary | ICD-10-CM | POA: Diagnosis not present

## 2021-12-31 DIAGNOSIS — Z743 Need for continuous supervision: Secondary | ICD-10-CM | POA: Diagnosis not present

## 2022-01-01 ENCOUNTER — Other Ambulatory Visit: Payer: Self-pay

## 2022-01-01 ENCOUNTER — Emergency Department (HOSPITAL_COMMUNITY)
Admission: EM | Admit: 2022-01-01 | Discharge: 2022-01-01 | Disposition: A | Discharge status: Home / Self Care | Payer: Medicare Other | Attending: Emergency Medicine | Admitting: Emergency Medicine

## 2022-01-01 DIAGNOSIS — Z743 Need for continuous supervision: Secondary | ICD-10-CM | POA: Diagnosis not present

## 2022-01-01 DIAGNOSIS — T83011A Breakdown (mechanical) of indwelling urethral catheter, initial encounter: Secondary | ICD-10-CM

## 2022-01-01 DIAGNOSIS — T83098A Other mechanical complication of other indwelling urethral catheter, initial encounter: Secondary | ICD-10-CM | POA: Insufficient documentation

## 2022-01-01 DIAGNOSIS — Z7982 Long term (current) use of aspirin: Secondary | ICD-10-CM | POA: Diagnosis not present

## 2022-01-01 DIAGNOSIS — R82998 Other abnormal findings in urine: Secondary | ICD-10-CM | POA: Insufficient documentation

## 2022-01-01 DIAGNOSIS — I10 Essential (primary) hypertension: Secondary | ICD-10-CM | POA: Diagnosis not present

## 2022-01-01 NOTE — ED Notes (Signed)
Patient yelling from room c/o length of time he has been in the ED.  States "this should have only been a 15 minute procedure.  My catheter needs to be changed."  Patient informed of procedure and process.  Continues to argue with RN.

## 2022-01-01 NOTE — ED Notes (Signed)
PTAR given report and all questions answered.  All personal belongings transported back to facility with patient

## 2022-01-01 NOTE — ED Notes (Signed)
Report attempted.  No answer from staff at [facility].  AVS provided to PTAR to given to facility.  Patient made aware of discharge instructions

## 2022-01-01 NOTE — ED Provider Notes (Signed)
[facility] DEPT Provider Note   CSN: [id number] Arrival date & time: 01/01/22  0003     History  Chief Complaint  Patient presents with   Urinary Catheter Problem    Caleb [name] is a 88 y.o. male.  The history is provided by the patient.  Caleb [name] is a 88 y.o. male who presents to the Emergency Department complaining of Foley catheter problem.  He presents to the emergency department by EMS from [facility] facility for evaluation of Foley catheter dysfunction.  He states that he was covered in urine and it was leaking.  Per report patient's catheter was intact on arrival but the bag was disconnected.  It was reconnected and flushed and draining without difficulty.  When this was discussed with the patient he states that it needs to be changed and they will not change at the facility and it has not been changed for 6 weeks.  He denies any fevers, abdominal pain, nausea, vomiting, back pain.     Home Medications Prior to Admission medications   Medication Sig Start Date End Date Taking? Authorizing Provider  acetaminophen (TYLENOL) 325 MG tablet Take 2 tablets (650 mg total) by mouth every 6 (six) hours as needed for mild pain (or Fever >/= 101). 07/28/21   Elgergawy, Silver Huguenin, MD  aspirin EC 81 MG tablet Take 1 tablet (81 mg total) by mouth daily. 09/23/21   Medina-Vargas, Monina C, NP  Calcium Carbonate-Vitamin D (CALCIUM-D PO) Take 1 tablet by mouth daily.    [provider]  lactulose (CHRONULAC) 10 GM/15ML solution Take 15 mLs (10 g total) by mouth 2 (two) times daily as needed for mild constipation. 09/23/21   Medina-Vargas, Monina C, NP  metoprolol tartrate (LOPRESSOR) 25 MG tablet Take 0.5 tablets (12.5 mg total) by mouth 2 (two) times daily. 09/23/21   Medina-Vargas, Monina C, NP  Multiple Vitamin (MULTIVITAMIN WITH MINERALS) TABS tablet Take 1 tablet by mouth daily. Centrum Silver    [provider]  Multiple  Vitamins-Minerals (PRESERVISION AREDS 2) CAPS Take by mouth.    [provider]  NON FORMULARY Diet:Heart healthy    [provider]  pantoprazole (PROTONIX) 40 MG tablet Take 1 tablet (40 mg total) by mouth daily. Give one tablet by mouth twice daily for Upper GI Bleed x 8wks 09/23/21   Medina-Vargas, Monina C, NP  polyethylene glycol powder (GLYCOLAX/MIRALAX) 17 GM/SCOOP powder Take 17 g by mouth daily. 09/23/21   Medina-Vargas, Monina C, NP  simvastatin (ZOCOR) 20 MG tablet Take 1 tablet (20 mg total) by mouth at bedtime. 09/23/21   Medina-Vargas, Monina C, NP      Allergies    Patient has no known allergies.    Review of Systems   Review of Systems  All other systems reviewed and are negative.  Physical Exam Updated Vital Signs BP (!) 161/90 (BP Location: Left Arm)    Pulse 72    Temp 98.2 F (36.8 C) (Oral)    Resp 18    Ht 5\' 10"  (1.778 m)    Wt 113.4 kg    SpO2 99%    BMI 35.87 kg/m  Physical Exam Vitals and nursing note reviewed.  Constitutional:      Appearance: He is well-developed.  HENT:     Head: Normocephalic and atraumatic.  Cardiovascular:     Rate and Rhythm: Normal rate and regular rhythm.     Heart sounds: No murmur heard. Pulmonary:  Effort: Pulmonary effort is normal. No respiratory distress.     Breath sounds: Normal breath sounds.  Abdominal:     Palpations: Abdomen is soft.     Tenderness: There is no abdominal tenderness. There is no guarding or rebound.  Genitourinary:    Comments: Foley catheter in place with clear pale urine in the tubing. Musculoskeletal:        General: No tenderness.  Skin:    General: Skin is warm and dry.  Neurological:     Mental Status: He is alert and oriented to person, place, and time.    ED Results / Procedures / Treatments   Labs (all labs ordered are listed, but only abnormal results are displayed) Labs Reviewed - No data to display  EKG None  Radiology No results  found.  Procedures Procedures    Medications Ordered in ED Medications - No data to display  ED Course/ Medical Decision Making/ A&P                           Medical Decision Making  Patient with chronic Foley catheter dependence here for evaluation of what he believes is a dislodged Foley catheter.  The catheter is not dislodged on assessment and is draining without difficulty.  Attempted to contact [facility] staff to ascertain whether or not he has been getting appropriate changes at the facility.  No answers when called.  Patient is adamant regarding having this catheter changed, will change in the department. Catheter was exchanged without difficulty.  Plan to discharge back to facility for outpatient follow-up.  He has no symptoms of acute infectious process at this time.       Final Clinical Impression(s) / ED Diagnoses Final diagnoses:  Malfunction of Foley catheter, initial encounter [facility])    Rx / DC Orders ED Discharge Orders     None         Quintella Reichert, MD 01/01/22 [phone]

## 2022-01-01 NOTE — ED Notes (Signed)
Patient's catheter checked.  Balloon appears to be inflated and catheter appears to be intact.  New drainage bag applied.  Patient continues to want foley replaced.

## 2022-01-01 NOTE — ED Triage Notes (Signed)
Patient BIB EMS for evaluation of "dislodged catheter."  Patient reports "I was sitting in my recliner and I noticed everything was wet."  On arrival, catheter remains in place.  No bag connected to foley.  States "I think the balloon burst.  They were suppose to change it this month, but they missed it."  Balloon checked and remains inflated.  Patient requesting catheter be changed because "it has been up there too long."

## 2022-01-01 NOTE — ED Notes (Signed)
Patient continues to scream from room.  States "he wants to leave because the clock is ticking."  Patient reminded that MD has to see patient prior to changing catheter.  Patient reports he is not currently having any pain and that current catheter is not bothering him.  Offered comfort measures and patient refused

## 2022-01-01 NOTE — ED Notes (Signed)
Patient provided with warm blanket for comfort ?

## 2022-01-01 NOTE — ED Notes (Signed)
[facility] called and made aware of patient's need for transport

## 2022-01-07 DIAGNOSIS — M6282 Rhabdomyolysis: Secondary | ICD-10-CM | POA: Diagnosis not present

## 2022-01-07 DIAGNOSIS — N179 Acute kidney failure, unspecified: Secondary | ICD-10-CM | POA: Diagnosis not present

## 2022-01-07 DIAGNOSIS — D62 Acute posthemorrhagic anemia: Secondary | ICD-10-CM | POA: Diagnosis not present

## 2022-01-07 DIAGNOSIS — K922 Gastrointestinal hemorrhage, unspecified: Secondary | ICD-10-CM | POA: Diagnosis not present

## 2022-01-07 DIAGNOSIS — Z466 Encounter for fitting and adjustment of urinary device: Secondary | ICD-10-CM | POA: Diagnosis not present

## 2022-01-07 DIAGNOSIS — N319 Neuromuscular dysfunction of bladder, unspecified: Secondary | ICD-10-CM | POA: Diagnosis not present

## 2022-01-21 DIAGNOSIS — N179 Acute kidney failure, unspecified: Secondary | ICD-10-CM | POA: Diagnosis not present

## 2022-01-21 DIAGNOSIS — K922 Gastrointestinal hemorrhage, unspecified: Secondary | ICD-10-CM | POA: Diagnosis not present

## 2022-01-21 DIAGNOSIS — D62 Acute posthemorrhagic anemia: Secondary | ICD-10-CM | POA: Diagnosis not present

## 2022-01-21 DIAGNOSIS — Z466 Encounter for fitting and adjustment of urinary device: Secondary | ICD-10-CM | POA: Diagnosis not present

## 2022-01-21 DIAGNOSIS — N319 Neuromuscular dysfunction of bladder, unspecified: Secondary | ICD-10-CM | POA: Diagnosis not present

## 2022-01-21 DIAGNOSIS — M6282 Rhabdomyolysis: Secondary | ICD-10-CM | POA: Diagnosis not present

## 2022-01-23 DIAGNOSIS — K922 Gastrointestinal hemorrhage, unspecified: Secondary | ICD-10-CM | POA: Diagnosis not present

## 2022-01-23 DIAGNOSIS — M6282 Rhabdomyolysis: Secondary | ICD-10-CM | POA: Diagnosis not present

## 2022-01-23 DIAGNOSIS — E785 Hyperlipidemia, unspecified: Secondary | ICD-10-CM | POA: Diagnosis not present

## 2022-01-23 DIAGNOSIS — H353211 Exudative age-related macular degeneration, right eye, with active choroidal neovascularization: Secondary | ICD-10-CM | POA: Diagnosis not present

## 2022-01-23 DIAGNOSIS — D62 Acute posthemorrhagic anemia: Secondary | ICD-10-CM | POA: Diagnosis not present

## 2022-01-23 DIAGNOSIS — I739 Peripheral vascular disease, unspecified: Secondary | ICD-10-CM | POA: Diagnosis not present

## 2022-01-23 DIAGNOSIS — N319 Neuromuscular dysfunction of bladder, unspecified: Secondary | ICD-10-CM | POA: Diagnosis not present

## 2022-01-23 DIAGNOSIS — I480 Paroxysmal atrial fibrillation: Secondary | ICD-10-CM | POA: Diagnosis not present

## 2022-01-23 DIAGNOSIS — Z7982 Long term (current) use of aspirin: Secondary | ICD-10-CM | POA: Diagnosis not present

## 2022-01-23 DIAGNOSIS — Z466 Encounter for fitting and adjustment of urinary device: Secondary | ICD-10-CM | POA: Diagnosis not present

## 2022-01-23 DIAGNOSIS — R338 Other retention of urine: Secondary | ICD-10-CM | POA: Diagnosis not present

## 2022-01-23 DIAGNOSIS — N179 Acute kidney failure, unspecified: Secondary | ICD-10-CM | POA: Diagnosis not present

## 2022-01-23 DIAGNOSIS — Z9181 History of falling: Secondary | ICD-10-CM | POA: Diagnosis not present

## 2022-01-23 DIAGNOSIS — I2510 Atherosclerotic heart disease of native coronary artery without angina pectoris: Secondary | ICD-10-CM | POA: Diagnosis not present

## 2022-01-23 DIAGNOSIS — I10 Essential (primary) hypertension: Secondary | ICD-10-CM | POA: Diagnosis not present

## 2022-01-26 DIAGNOSIS — K5901 Slow transit constipation: Secondary | ICD-10-CM | POA: Diagnosis not present

## 2022-01-26 DIAGNOSIS — E782 Mixed hyperlipidemia: Secondary | ICD-10-CM | POA: Diagnosis not present

## 2022-01-26 DIAGNOSIS — R339 Retention of urine, unspecified: Secondary | ICD-10-CM | POA: Diagnosis not present

## 2022-01-26 DIAGNOSIS — N1831 Chronic kidney disease, stage 3a: Secondary | ICD-10-CM | POA: Diagnosis not present

## 2022-01-26 DIAGNOSIS — I129 Hypertensive chronic kidney disease with stage 1 through stage 4 chronic kidney disease, or unspecified chronic kidney disease: Secondary | ICD-10-CM | POA: Diagnosis not present

## 2022-01-26 DIAGNOSIS — R600 Localized edema: Secondary | ICD-10-CM | POA: Diagnosis not present

## 2022-02-01 ENCOUNTER — Encounter (HOSPITAL_COMMUNITY): Payer: Self-pay | Admitting: *Deleted

## 2022-02-01 ENCOUNTER — Emergency Department (HOSPITAL_COMMUNITY)
Admission: EM | Admit: 2022-02-01 | Discharge: 2022-02-01 | Disposition: A | Discharge status: Home / Self Care | Payer: Medicare Other | Attending: Emergency Medicine | Admitting: Emergency Medicine

## 2022-02-01 DIAGNOSIS — Z7982 Long term (current) use of aspirin: Secondary | ICD-10-CM | POA: Insufficient documentation

## 2022-02-01 DIAGNOSIS — R109 Unspecified abdominal pain: Secondary | ICD-10-CM | POA: Diagnosis not present

## 2022-02-01 DIAGNOSIS — R339 Retention of urine, unspecified: Secondary | ICD-10-CM | POA: Insufficient documentation

## 2022-02-01 DIAGNOSIS — T83098A Other mechanical complication of other indwelling urethral catheter, initial encounter: Secondary | ICD-10-CM | POA: Diagnosis not present

## 2022-02-01 DIAGNOSIS — T83091A Other mechanical complication of indwelling urethral catheter, initial encounter: Secondary | ICD-10-CM

## 2022-02-01 DIAGNOSIS — N390 Urinary tract infection, site not specified: Secondary | ICD-10-CM

## 2022-02-01 LAB — URINALYSIS, ROUTINE W REFLEX MICROSCOPIC
Bilirubin Urine: NEGATIVE
Glucose, UA: NEGATIVE mg/dL
Ketones, ur: NEGATIVE mg/dL
Nitrite: NEGATIVE
Protein, ur: >300 mg/dL — AB
Specific Gravity, Urine: 1.015 (ref 1.005–1.030)
pH: 8.5 — ABNORMAL HIGH (ref 5.0–8.0)

## 2022-02-01 LAB — URINALYSIS, MICROSCOPIC (REFLEX)
RBC / HPF: >50 RBC/hpf (ref 0–5)
WBC, UA: >50 WBC/hpf (ref 0–5)

## 2022-02-01 MED ORDER — CEPHALEXIN 500 MG PO CAPS
500.0000 mg | ORAL_CAPSULE | Freq: Once | ORAL | Status: AC
  Administered 2022-02-01: 500 mg via ORAL

## 2022-02-01 NOTE — ED Triage Notes (Signed)
Catheter is stopped up

## 2022-02-01 NOTE — Discharge Instructions (Signed)
Your urine shows signs of possible infection We have changed to a new catheter Take Keflex three times daily for 7 days  ER for worsening symptoms  Thank you for letting us take care of you today!  Please obtain all of your results from medical records or have your doctors office obtain the results - share them with your doctor - you should be seen at your doctors office in the next 2 days. Call today to arrange your follow up. Take the medications as prescribed. Please review all of the medicines and only take them if you do not have an allergy to them. Please be aware that if you are taking birth control pills, taking other prescriptions, ESPECIALLY ANTIBIOTICS may make the birth control ineffective - if this is the case, either do not engage in sexual activity or use alternative methods of birth control such as condoms until you have finished the medicine and your family doctor says it is OK to restart them. If you are on a blood thinner such as COUMADIN, be aware that any other medicine that you take may cause the coumadin to either work too much, or not enough - you should have your coumadin level rechecked in next 7 days if this is the case.  ?  It is also a possibility that you have an allergic reaction to any of the medicines that you have been prescribed - Everybody reacts differently to medications and while MOST people have no trouble with most medicines, you may have a reaction such as nausea, vomiting, rash, swelling, shortness of breath. If this is the case, please stop taking the medicine immediately and contact your physician.   If you were given a medication in the ED such as percocet, vicodin, or morphine, be aware that these medicines are sedating and may change your ability to take care of yourself adequately for several hours after being given this medicines - you should not drive or take care of small children if you were given this medicine in the Emergency Department or if you  have been prescribed these types of medicines. ?   You should return to the ER IMMEDIATELY if you develop severe or worsening symptoms.

## 2022-02-01 NOTE — ED Notes (Signed)
Pt undressed, put in gown and on the cardiac monitor

## 2022-02-01 NOTE — ED Provider Notes (Signed)
[facility] EMERGENCY DEPARTMENT Provider Note   CSN: [id number] Arrival date & time: 02/01/22  1733     History  Chief Complaint  Patient presents with   Urinary Retention    Caleb [name] is a 88 y.o. male.  HPI  This patient is a 88 year old male, this patient has a history of prior urinary retention after prostate surgery.  Presents to the hospital today with a complaint of catheter dysfunction stating that since last night his catheter has not been draining any urine and he has had progressive abdominal discomfort.  It was last changed about a week and a half ago.  He denies fevers nausea vomiting or back pain.  Home Medications Prior to Admission medications   Medication Sig Start Date End Date Taking? Authorizing Provider  acetaminophen (TYLENOL) 325 MG tablet Take 2 tablets (650 mg total) by mouth every 6 (six) hours as needed for mild pain (or Fever >/= 101). 07/28/21   Elgergawy, Silver Huguenin, MD  aspirin EC 81 MG tablet Take 1 tablet (81 mg total) by mouth daily. 09/23/21   Medina-Vargas, Monina C, NP  Calcium Carbonate-Vitamin D (CALCIUM-D PO) Take 1 tablet by mouth daily.    [provider]  lactulose (CHRONULAC) 10 GM/15ML solution Take 15 mLs (10 g total) by mouth 2 (two) times daily as needed for mild constipation. 09/23/21   Medina-Vargas, Monina C, NP  metoprolol tartrate (LOPRESSOR) 25 MG tablet Take 0.5 tablets (12.5 mg total) by mouth 2 (two) times daily. 09/23/21   Medina-Vargas, Monina C, NP  Multiple Vitamin (MULTIVITAMIN WITH MINERALS) TABS tablet Take 1 tablet by mouth daily. Centrum Silver    [provider]  Multiple Vitamins-Minerals (PRESERVISION AREDS 2) CAPS Take by mouth.    [provider]  NON FORMULARY Diet:Heart healthy    [provider]  pantoprazole (PROTONIX) 40 MG tablet Take 1 tablet (40 mg total) by mouth daily. Give one tablet by mouth twice daily for Upper GI Bleed x 8wks 09/23/21   Medina-Vargas, Monina C, NP   polyethylene glycol powder (GLYCOLAX/MIRALAX) 17 GM/SCOOP powder Take 17 g by mouth daily. 09/23/21   Medina-Vargas, Monina C, NP  simvastatin (ZOCOR) 20 MG tablet Take 1 tablet (20 mg total) by mouth at bedtime. 09/23/21   Medina-Vargas, Monina C, NP      Allergies    Patient has no known allergies.    Review of Systems   Review of Systems  All other systems reviewed and are negative.  Physical Exam Updated Vital Signs BP (!) 175/101    Pulse (!) 108    Temp 98.3 F (36.8 C)    Resp 20    SpO2 98%  Physical Exam Vitals and nursing note reviewed.  Constitutional:      General: He is not in acute distress.    Appearance: He is well-developed.  HENT:     Head: Normocephalic and atraumatic.     Mouth/Throat:     Pharynx: No oropharyngeal exudate.  Eyes:     General: No scleral icterus.       Right eye: No discharge.        Left eye: No discharge.     Conjunctiva/sclera: Conjunctivae normal.     Pupils: Pupils are equal, round, and reactive to light.  Neck:     Thyroid: No thyromegaly.     Vascular: No JVD.  Cardiovascular:     Rate and Rhythm: Normal rate and regular rhythm.     Heart sounds:  Normal heart sounds. No murmur heard.   No friction rub. No gallop.  Pulmonary:     Effort: Pulmonary effort is normal. No respiratory distress.     Breath sounds: Normal breath sounds. No wheezing or rales.  Abdominal:     General: Bowel sounds are normal. There is no distension.     Palpations: Abdomen is soft. There is no mass.     Tenderness: There is no abdominal tenderness.  Genitourinary:    Comments: Catheter seated in the urethra, does not appear to have any active drainage from the catheter Musculoskeletal:        General: No tenderness. Normal range of motion.     Cervical back: Normal range of motion and neck supple.  Lymphadenopathy:     Cervical: No cervical adenopathy.  Skin:    General: Skin is warm and dry.     Findings: Erythema present. No rash.     Comments:  Some unilateral erythema and swelling of the leg  Neurological:     Mental Status: He is alert.     Coordination: Coordination normal.  Psychiatric:        Behavior: Behavior normal.    ED Results / Procedures / Treatments   Labs (all labs ordered are listed, but only abnormal results are displayed) Labs Reviewed  URINALYSIS, ROUTINE W REFLEX MICROSCOPIC - Abnormal; Notable for the following components:      Result Value   APPearance CLOUDY (*)    pH 8.5 (*)    Hgb urine dipstick LARGE (*)    Protein, ur >300 (*)    Leukocytes,Ua MODERATE (*)    All other components within normal limits  URINALYSIS, MICROSCOPIC (REFLEX) - Abnormal; Notable for the following components:   Bacteria, UA MANY (*)    All other components within normal limits  URINE CULTURE    EKG None  Radiology No results found.  Procedures Procedures    Medications Ordered in ED Medications  cephALEXin (KEFLEX) capsule 500 mg (has no administration in time range)    ED Course/ Medical Decision Making/ A&P                           Medical Decision Making Amount and/or Complexity of Data Reviewed Labs: ordered.  Risk Prescription drug management.   This elderly male does appear slightly generally weak but his vital signs are unremarkable except for mild tachycardia and hypertension both of which can be explained by his urinary retention.  We will proceed with changing catheter, send off for urinalysis, right leg chronically swollen, no signs of infection today - no fever and no tendrenss.  UTI present  Catheter is now draining perfectly, patient is feeling much better, cephalexin prescribed, given first dose prior to discharge.  On my personal interpretation of the lab work the patient has many bacteria and too numerous to count white blood cells consistent with urinary tract infection.  His catheter was most recently changed was in the last 2 weeks, this may have been the  nidus.        Final Clinical Impression(s) / ED Diagnoses Final diagnoses:  None    Rx / DC Orders ED Discharge Orders     None         Noemi Chapel, MD 02/01/22 [phone]

## 2022-02-02 ENCOUNTER — Other Ambulatory Visit: Payer: Self-pay

## 2022-02-02 ENCOUNTER — Ambulatory Visit (INDEPENDENT_AMBULATORY_CARE_PROVIDER_SITE_OTHER): Payer: Medicare Other | Admitting: Physician Assistant

## 2022-02-02 VITALS — BP 131/71 | HR 78

## 2022-02-02 DIAGNOSIS — T839XXD Unspecified complication of genitourinary prosthetic device, implant and graft, subsequent encounter: Secondary | ICD-10-CM

## 2022-02-02 DIAGNOSIS — N319 Neuromuscular dysfunction of bladder, unspecified: Secondary | ICD-10-CM

## 2022-02-02 DIAGNOSIS — T839XXA Unspecified complication of genitourinary prosthetic device, implant and graft, initial encounter: Secondary | ICD-10-CM | POA: Insufficient documentation

## 2022-02-02 DIAGNOSIS — R339 Retention of urine, unspecified: Secondary | ICD-10-CM

## 2022-02-02 NOTE — Patient Instructions (Signed)
Keep appt with Dr. Alyson Ingles on 3/29.

## 2022-02-02 NOTE — Progress Notes (Signed)
Assessment: 1. Problem with Foley catheter, subsequent encounter   2. Urinary retention   3. Neurogenic bladder     Plan: Tension noted on foley when attached to leg clamp with standing. Position adjusted to avoid tension on the ostomy sight. Pt will continue to change catheters monthly and keep FU appt with Dr. Alyson Ingles on 3/29. If he has additional concerns with the foley, he will let us know. Culture from ED eval is pending. He was given Keflex at DC from the ED, but has not started the medication.   Chief Complaint: No chief complaint on file. Foley problems  HPI: Caleb [name] is a 88 y.o. male with h/o perineal urethrostomy and long tern indwelling foley who presents for continued evaluation of issues with his catheter draining. Pt developed abdominal pain yesterday due to lack of urine output through his catheter and presented to the ED. Catheter replaced and discomfort resolved with good output noted. ED ordered cx and gave Keflex Rx, but pt did not start. Pt is asymptomatic for UTI today. Urine appears somewhat cloudy. No fever, chills, body aches. No abdominal pain. No gross hematuria.   Medical records from Urology and ED visits personally reviewed during OV  Portions of the above documentation were copied from a prior visit for review purposes only.  Allergies: No Known Allergies  PMH: Past Medical History:  Diagnosis Date   Coronary artery disease    Hyperlipidemia    Obesity    Penile cancer (HCC)    Swelling of extremity    Left Leg    PSH: Past Surgical History:  Procedure Laterality Date   APPENDECTOMY     BACK SURGERY     BIOPSY  07/22/2021   Procedure: BIOPSY;  Surgeon: Jackquline Denmark, MD;  Location: [facility] ENDOSCOPY;  Service: Gastroenterology;;   CHOLECYSTECTOMY     CORONARY ANGIOPLASTY  11/2005   RCA PCI AND STENTING WITH A CYPHER DES   CORONARY ARTERY BYPASS GRAFT     ESOPHAGOGASTRODUODENOSCOPY (EGD) WITH PROPOFOL N/A 07/22/2021   Procedure:  ESOPHAGOGASTRODUODENOSCOPY (EGD) WITH PROPOFOL;  Surgeon: Jackquline Denmark, MD;  Location: [facility];  Service: Gastroenterology;  Laterality: N/A;   HIATAL HERNIA REPAIR     lymph node removal     NM MYOCAR MULTIPLE W/SPECT  11/26/2009   EF 62%. NORMAL MYOCARDIAL PERFUSION STUDY.   TRANSTHORACIC ECHOCARDIOGRAM  12/22/2005   MILD TO MOD AORTIC SCLEROSIS W/O STENOSIS. MILD TO MODERATE MITRAL CALCIFICATION. LA- MILDLY DILATED.    SH: Social History   Tobacco Use   Smoking status: Former    Types: Cigarettes    Quit date: 07/14/1983    Years since quitting: 38.5   Smokeless tobacco: Never  Vaping Use   Vaping Use: Never used  Substance Use Topics   Alcohol use: No   Drug use: No    ROS: Constitutional:  Negative for fever, chills, weight loss CV: Negative for chest pain, previous MI, hypertension Respiratory:  Negative for shortness of breath, wheezing, sleep apnea, frequent cough GI:  Negative for nausea, vomiting, bloody stool, GERD  PE: BP 131/71    Pulse 78  GENERAL APPEARANCE:  Well appearing, well developed, well nourished, NAD HEENT:  Atraumatic, normocephalic NECK:  Supple. Trachea midline ABDOMEN:  Soft, non-tender, no masses GU: Catheter intact and draining well. Ostomy sight clean without drainage, erythema, or breach in skin integrity EXTREMITIES:  Moves all extremities well, without clubbing, cyanosis, or edema NEUROLOGIC:  Alert and oriented x 3, ambulates with rolling Berger  MENTAL STATUS:  appropriate BACK:  Non-tender to palpation, No CVAT SKIN:  Warm, dry, and intact   Results: Laboratory Data: Lab Results  Component Value Date   WBC 6.4 11/12/2021   HGB 10.7 (L) 11/12/2021   HCT 33.4 (L) 11/12/2021   MCV 99.1 11/12/2021   PLT 174 11/12/2021    Lab Results  Component Value Date   CREATININE 1.43 (H) 11/12/2021    No results found for: PSA  No results found for: TESTOSTERONE  Lab Results  Component Value Date   HGBA1C 5.3 11/12/2021     Urinalysis    Component Value Date/Time   COLORURINE YELLOW 02/01/2022 1829   APPEARANCEUR CLOUDY (A) 02/01/2022 1829   LABSPEC 1.015 02/01/2022 1829   PHURINE 8.5 (H) 02/01/2022 1829   GLUCOSEU NEGATIVE 02/01/2022 1829   HGBUR LARGE (A) 02/01/2022 1829   BILIRUBINUR NEGATIVE 02/01/2022 1829   BILIRUBINUR negative 03/25/2020 1156   KETONESUR NEGATIVE 02/01/2022 1829   PROTEINUR >300 (A) 02/01/2022 1829   UROBILINOGEN 0.2 03/25/2020 1156   NITRITE NEGATIVE 02/01/2022 1829   LEUKOCYTESUR MODERATE (A) 02/01/2022 1829    Lab Results  Component Value Date   BACTERIA MANY (A) 02/01/2022    Pertinent Imaging:  No results found for this or any previous visit.  Results for orders placed during the hospital encounter of 05/13/05  US VenoUS Imaging Bilateral  Narrative Clinical data: Bilateral lower extremity edema and pain  Bilateral lower extremity venous Doppler ultrasound:  The lower extremity deep venous system demonstrates normal compressibility, phasicity, and augmentation.   No evidence of DVT.  Impression: 1. Negative for   lower extremity DVT  Provider: Mertie Clause  No results found for this or any previous visit.  No results found for this or any previous visit.  No results found for this or any previous visit.  No results found for this or any previous visit.  No results found for this or any previous visit.  No results found for this or any previous visit.  Results for orders placed or performed during the hospital encounter of 02/01/22 (from the past 24 hour(s))  Urinalysis, Routine w reflex microscopic Urine, Clean Catch   Collection Time: 02/01/22  6:29 PM  Result Value Ref Range   Color, Urine YELLOW YELLOW   APPearance CLOUDY (A) CLEAR   Specific Gravity, Urine 1.015 1.005 - 1.030   pH 8.5 (H) 5.0 - 8.0   Glucose, UA NEGATIVE NEGATIVE mg/dL   Hgb urine dipstick LARGE (A) NEGATIVE   Bilirubin Urine NEGATIVE NEGATIVE   Ketones, ur  NEGATIVE NEGATIVE mg/dL   Protein, ur >300 (A) NEGATIVE mg/dL   Nitrite NEGATIVE NEGATIVE   Leukocytes,Ua MODERATE (A) NEGATIVE  Urinalysis, Microscopic (reflex)   Collection Time: 02/01/22  6:29 PM  Result Value Ref Range   RBC / HPF >50 0 - 5 RBC/hpf   WBC, UA >50 0 - 5 WBC/hpf   Bacteria, UA MANY (A) NONE SEEN   Squamous Epithelial / LPF 0-5 0 - 5   Triple Phosphate Crystal PRESENT

## 2022-02-04 LAB — URINE CULTURE: Culture: >=100000 — AB

## 2022-02-05 ENCOUNTER — Telehealth: Payer: Self-pay | Admitting: *Deleted

## 2022-02-05 NOTE — Telephone Encounter (Signed)
Post ED Visit - Positive Culture Follow-up  Culture report reviewed by antimicrobial stewardship pharmacist: [facility] Team []  Elenor Quinones, Pharm.D. []  Heide Guile, Pharm.D., BCPS AQ-ID []  Parks Neptune, Pharm.D., BCPS []  Alycia Rossetti, Pharm.D., BCPS []  Ingalls, Pharm.D., BCPS, AAHIVP []  Legrand Como, Pharm.D., BCPS, AAHIVP []  Salome Arnt, PharmD, BCPS []  Johnnette Gourd, PharmD, BCPS []  Hughes Better, PharmD, BCPS []  Leeroy Cha, PharmD []  Laqueta Linden, PharmD, BCPS []  Albertina Parr, PharmD  [facility] Team []  Leodis Sias, PharmD []  Lindell Spar, PharmD []  Royetta Asal, PharmD []  Graylin Shiver, Rph []  Rema Fendt) Glennon Mac, PharmD []  Arlyn Dunning, PharmD []  Netta Cedars, PharmD []  Dia Sitter, PharmD []  Leone Haven, PharmD []  Gretta Arab, PharmD []  Theodis Shove, PharmD []  Peggyann Juba, PharmD []  Reuel Boom, PharmD   Positive urine culture Treated with Keflex, organism sensitive to the same and no further patient follow-up is required at this time.  Carlisle Cater, PA  Harlon Flor [place] 02/05/2022, 11:42 AM

## 2022-02-06 DIAGNOSIS — E782 Mixed hyperlipidemia: Secondary | ICD-10-CM | POA: Diagnosis not present

## 2022-02-06 DIAGNOSIS — I10 Essential (primary) hypertension: Secondary | ICD-10-CM | POA: Diagnosis not present

## 2022-02-06 DIAGNOSIS — Z0000 Encounter for general adult medical examination without abnormal findings: Secondary | ICD-10-CM | POA: Diagnosis not present

## 2022-02-06 DIAGNOSIS — T839XXD Unspecified complication of genitourinary prosthetic device, implant and graft, subsequent encounter: Secondary | ICD-10-CM | POA: Diagnosis not present

## 2022-02-06 DIAGNOSIS — E6609 Other obesity due to excess calories: Secondary | ICD-10-CM | POA: Diagnosis not present

## 2022-02-06 DIAGNOSIS — E7849 Other hyperlipidemia: Secondary | ICD-10-CM | POA: Diagnosis not present

## 2022-02-06 DIAGNOSIS — C61 Malignant neoplasm of prostate: Secondary | ICD-10-CM | POA: Diagnosis not present

## 2022-02-06 DIAGNOSIS — C609 Malignant neoplasm of penis, unspecified: Secondary | ICD-10-CM | POA: Diagnosis not present

## 2022-02-06 DIAGNOSIS — Z6833 Body mass index (BMI) 33.0-33.9, adult: Secondary | ICD-10-CM | POA: Diagnosis not present

## 2022-02-06 DIAGNOSIS — I2510 Atherosclerotic heart disease of native coronary artery without angina pectoris: Secondary | ICD-10-CM | POA: Diagnosis not present

## 2022-02-06 DIAGNOSIS — Z1331 Encounter for screening for depression: Secondary | ICD-10-CM | POA: Diagnosis not present

## 2022-02-11 DIAGNOSIS — R338 Other retention of urine: Secondary | ICD-10-CM | POA: Diagnosis not present

## 2022-02-11 DIAGNOSIS — N319 Neuromuscular dysfunction of bladder, unspecified: Secondary | ICD-10-CM | POA: Diagnosis not present

## 2022-02-11 DIAGNOSIS — M6282 Rhabdomyolysis: Secondary | ICD-10-CM | POA: Diagnosis not present

## 2022-02-17 ENCOUNTER — Encounter (HOSPITAL_COMMUNITY): Payer: Self-pay | Admitting: *Deleted

## 2022-02-17 ENCOUNTER — Emergency Department (HOSPITAL_COMMUNITY): Payer: Medicare Other

## 2022-02-17 ENCOUNTER — Emergency Department (HOSPITAL_COMMUNITY)
Admission: EM | Admit: 2022-02-17 | Discharge: 2022-02-17 | Disposition: A | Discharge status: Home / Self Care | Payer: Medicare Other | Attending: Emergency Medicine | Admitting: Emergency Medicine

## 2022-02-17 DIAGNOSIS — K5900 Constipation, unspecified: Secondary | ICD-10-CM | POA: Diagnosis not present

## 2022-02-17 DIAGNOSIS — Z7982 Long term (current) use of aspirin: Secondary | ICD-10-CM | POA: Diagnosis not present

## 2022-02-17 DIAGNOSIS — T83091A Other mechanical complication of indwelling urethral catheter, initial encounter: Secondary | ICD-10-CM | POA: Diagnosis not present

## 2022-02-17 DIAGNOSIS — T83098A Other mechanical complication of other indwelling urethral catheter, initial encounter: Secondary | ICD-10-CM | POA: Insufficient documentation

## 2022-02-17 NOTE — ED Provider Notes (Signed)
[facility] Provider Note   CSN: [id number] Arrival date & time: 02/17/22  1431     History  Chief Complaint  Patient presents with   Urinary Retention    Constipation     Caleb [name] is a 88 y.o. male.  HPI     Caleb [name] is a 88 y.o. male who presents to the Emergency Department complaining of urinary retention and constipation since yesterday.  States he has an indwelling Foley catheter, catheter was replaced on 02/01/2022.  States urine stopped flowing from the catheter yesterday.  He complains of lower abdominal pain and pressure sensation.  Believes he is constipated although he states he has passed some small hard stool.  Denies any chest pain, shortness of breath, fever or chills.  No nausea or vomiting.  has history of neurogenic bladder  Home Medications Prior to Admission medications   Medication Sig Start Date End Date Taking? Authorizing Provider  acetaminophen (TYLENOL) 325 MG tablet Take 2 tablets (650 mg total) by mouth every 6 (six) hours as needed for mild pain (or Fever >/= 101). 07/28/21   Elgergawy, Silver Huguenin, MD  aspirin EC 81 MG tablet Take 1 tablet (81 mg total) by mouth daily. 09/23/21   Medina-Vargas, Monina C, NP  Calcium Carbonate-Vitamin D (CALCIUM-D PO) Take 1 tablet by mouth daily.    [provider]  lactulose (CHRONULAC) 10 GM/15ML solution Take 15 mLs (10 g total) by mouth 2 (two) times daily as needed for mild constipation. 09/23/21   Medina-Vargas, Monina C, NP  metoprolol tartrate (LOPRESSOR) 25 MG tablet Take 0.5 tablets (12.5 mg total) by mouth 2 (two) times daily. 09/23/21   Medina-Vargas, Monina C, NP  Multiple Vitamin (MULTIVITAMIN WITH MINERALS) TABS tablet Take 1 tablet by mouth daily. Centrum Silver    [provider]  Multiple Vitamins-Minerals (PRESERVISION AREDS 2) CAPS Take by mouth.    [provider]  NON FORMULARY Diet:Heart healthy    [provider]  pantoprazole  (PROTONIX) 40 MG tablet Take 1 tablet (40 mg total) by mouth daily. Give one tablet by mouth twice daily for Upper GI Bleed x 8wks 09/23/21   Medina-Vargas, Monina C, NP  polyethylene glycol powder (GLYCOLAX/MIRALAX) 17 GM/SCOOP powder Take 17 g by mouth daily. 09/23/21   Medina-Vargas, Monina C, NP  simvastatin (ZOCOR) 20 MG tablet Take 1 tablet (20 mg total) by mouth at bedtime. 09/23/21   Medina-Vargas, Monina C, NP      Allergies    Patient has no known allergies.    Review of Systems   Review of Systems  Respiratory:  Negative for shortness of breath.   Cardiovascular:  Negative for chest pain.  Gastrointestinal:  Positive for abdominal pain and constipation. Negative for blood in stool, nausea and vomiting.  Genitourinary:  Positive for difficulty urinating. Negative for penile swelling, scrotal swelling and testicular pain.  All other systems reviewed and are negative.  Physical Exam Updated Vital Signs BP 133/83 (BP Location: Left Arm)    Pulse (!) 113    Temp 98.4 F (36.9 C) (Oral)    Resp 20    SpO2 93%  Physical Exam Vitals and nursing note reviewed.  Constitutional:      General: He is not in acute distress.    Appearance: Normal appearance.  Cardiovascular:     Rate and Rhythm: Normal rate and regular rhythm.     Pulses: Normal pulses.  Pulmonary:     Effort: Pulmonary effort  is normal.     Breath sounds: Normal breath sounds.  Abdominal:     General: There is distension.     Palpations: Abdomen is soft.     Tenderness: There is abdominal tenderness.     Comments: Tenderness to palpation of the central lower abdomen with some mild distention over the bladder,  No guarding or rebound tenderness.  No CVA tenderness  Skin:    General: Skin is warm.     Capillary Refill: Capillary refill takes less than 2 seconds.  Neurological:     General: No focal deficit present.     Mental Status: He is alert.     Sensory: No sensory deficit.     Motor: No weakness.    ED  Results / Procedures / Treatments   Labs (all labs ordered are listed, but only abnormal results are displayed) Labs Reviewed - No data to display  EKG None  Radiology DG Abdomen 1 View  Result Date: 02/17/2022 CLINICAL DATA:  Constipation, urine retention EXAM: ABDOMEN - 1 VIEW COMPARISON:  01/09/2018 FINDINGS: Curvilinear lucency projects along the medial left hemidiaphragm. Gas distended nondilated small bowel and colon. Iliac arterial calcifications. Lower lumbar spondylitic change. Surgical clips over the left hip. IMPRESSION: 1. Nonobstructive bowel gas pattern. 2. Nonspecific linear lucency adjacent to the left diaphragmatic leaflet. Recommend horizontal beam radiograph if free intraperitoneal gas is a clinical concern. Electronically Signed   By: Lucrezia Europe M.D.   On: 02/17/2022 16:49    Procedures Procedures    Medications Ordered in ED Medications - No data to display  ED Course/ Medical Decision Making/ A&P                           Medical Decision Making Patient here for complication of Foley catheter.  Hx of neurogenic bladder. Unable to void since yesterday.  He describes having some pain and fullness sensation of his lower abdomen as well.  Concerned that he may be constipated.  Endorses having passage of some small hard stools earlier today.  No vomiting, or chills.  No upper abdominal pain or chest pain.  Amount and/or Complexity of Data Reviewed Radiology: ordered.    Details: 1 view of the abdomen obtained.  Shows nonobstructive bowel gas pattern.  There is a nonspecific linear lucency at the left diaphragmatic leaflet.   Patient here for possible obstruction of Foley catheter.  Last urinated yesterday.  On initial exam, patient has tenderness and mild distention of the lower abdomen over the area of the bladder.  Bladder scan was performed that showed 987 cc urine within the bladder. Nursing attempted to irrigate Foley catheter with significant resistance.  Some  thick sediment was removed and small amount of urine draining from catheter after irrigation.  1200 cc of saline used.  Likely significant obstruction related to sediment, will proceed with replacement of his Foley catheter.  Foley catheter was replaced with 16 [place].  Patient had 2250 cc urine output.  On recheck, abdomen now soft nontender.  Catheter draining well.  Nonspecific linear lucency seen on x-ray at the left diaphragm of unknown etiology.  Patient did not complain of upper abdominal pain, chest pain or shortness of breath.  After insertion of new Foley catheter and bladder drained patient now pain-free.  His abdomen is soft.  There is no peritoneal signs.  Origin of this lucency seen on x-ray is unclear, but I doubt this is related to his presenting  sx's as his abd is now soft and nontender  Pt also seen by Dr. Regenia Skeeter  Pt appears appropriate for discharge home.  We will follow-up with urology.  Return precautions were discussed.  I have advised patient to take over-the-counter stool softener as directed if needed for issues with hard stools.         Final Clinical Impression(s) / ED Diagnoses Final diagnoses:  Obstruction of Foley catheter, initial encounter [facility])    Rx / [place] Orders ED Discharge Orders     None         Kem Parkinson, PA-C 02/17/22 1805    Sherwood Gambler, MD 02/18/22 2253

## 2022-02-17 NOTE — ED Triage Notes (Signed)
Catheter stopped flowing today, also c/o constipation

## 2022-02-17 NOTE — ED Notes (Signed)
Irrigated foley with minimal success. 124ml input and 800 output. Bladder scan amount still noted

## 2022-02-17 NOTE — Discharge Instructions (Signed)
Your Foley catheter was obstructed.  It is important that you keep the area where the catheter is inserted clean with mild soap and water.  You may take over-the-counter stool softener as needed for constipation.  Please follow-up with your primary care provider or return to the emergency department for any new or worsening symptoms.

## 2022-02-17 NOTE — ED Notes (Signed)
Bladder scan, 987 ml's noted

## 2022-02-21 ENCOUNTER — Other Ambulatory Visit: Payer: Self-pay

## 2022-02-21 ENCOUNTER — Emergency Department (HOSPITAL_COMMUNITY)
Admission: EM | Admit: 2022-02-21 | Discharge: 2022-02-21 | Disposition: A | Discharge status: Home / Self Care | Payer: Medicare Other | Attending: Emergency Medicine | Admitting: Emergency Medicine

## 2022-02-21 ENCOUNTER — Encounter (HOSPITAL_COMMUNITY): Payer: Self-pay

## 2022-02-21 DIAGNOSIS — Z7982 Long term (current) use of aspirin: Secondary | ICD-10-CM | POA: Diagnosis not present

## 2022-02-21 DIAGNOSIS — T839XXA Unspecified complication of genitourinary prosthetic device, implant and graft, initial encounter: Secondary | ICD-10-CM

## 2022-02-21 DIAGNOSIS — T83098A Other mechanical complication of other indwelling urethral catheter, initial encounter: Secondary | ICD-10-CM | POA: Diagnosis not present

## 2022-02-21 DIAGNOSIS — T83091A Other mechanical complication of indwelling urethral catheter, initial encounter: Secondary | ICD-10-CM | POA: Diagnosis not present

## 2022-02-21 DIAGNOSIS — Y846 Urinary catheterization as the cause of abnormal reaction of the patient, or of later complication, without mention of misadventure at the time of the procedure: Secondary | ICD-10-CM | POA: Diagnosis not present

## 2022-02-21 NOTE — ED Notes (Signed)
Leg bag given to pt for home use.

## 2022-02-21 NOTE — Discharge Instructions (Addendum)
Return if any problems.  Follow up with Dr. Alyson Ingles

## 2022-02-21 NOTE — ED Triage Notes (Signed)
Catheter stopped flowing for a few hours

## 2022-02-21 NOTE — ED Notes (Signed)
Attempted to flush catheter but unsuccessful

## 2022-02-22 DIAGNOSIS — H353211 Exudative age-related macular degeneration, right eye, with active choroidal neovascularization: Secondary | ICD-10-CM | POA: Diagnosis not present

## 2022-02-22 DIAGNOSIS — Z9181 History of falling: Secondary | ICD-10-CM | POA: Diagnosis not present

## 2022-02-22 DIAGNOSIS — I10 Essential (primary) hypertension: Secondary | ICD-10-CM | POA: Diagnosis not present

## 2022-02-22 DIAGNOSIS — I2510 Atherosclerotic heart disease of native coronary artery without angina pectoris: Secondary | ICD-10-CM | POA: Diagnosis not present

## 2022-02-22 DIAGNOSIS — N179 Acute kidney failure, unspecified: Secondary | ICD-10-CM | POA: Diagnosis not present

## 2022-02-22 DIAGNOSIS — D62 Acute posthemorrhagic anemia: Secondary | ICD-10-CM | POA: Diagnosis not present

## 2022-02-22 DIAGNOSIS — N319 Neuromuscular dysfunction of bladder, unspecified: Secondary | ICD-10-CM | POA: Diagnosis not present

## 2022-02-22 DIAGNOSIS — M6282 Rhabdomyolysis: Secondary | ICD-10-CM | POA: Diagnosis not present

## 2022-02-22 DIAGNOSIS — I739 Peripheral vascular disease, unspecified: Secondary | ICD-10-CM | POA: Diagnosis not present

## 2022-02-22 DIAGNOSIS — I480 Paroxysmal atrial fibrillation: Secondary | ICD-10-CM | POA: Diagnosis not present

## 2022-02-22 DIAGNOSIS — K922 Gastrointestinal hemorrhage, unspecified: Secondary | ICD-10-CM | POA: Diagnosis not present

## 2022-02-22 DIAGNOSIS — Z466 Encounter for fitting and adjustment of urinary device: Secondary | ICD-10-CM | POA: Diagnosis not present

## 2022-02-22 DIAGNOSIS — E785 Hyperlipidemia, unspecified: Secondary | ICD-10-CM | POA: Diagnosis not present

## 2022-02-22 DIAGNOSIS — R338 Other retention of urine: Secondary | ICD-10-CM | POA: Diagnosis not present

## 2022-02-22 DIAGNOSIS — Z7982 Long term (current) use of aspirin: Secondary | ICD-10-CM | POA: Diagnosis not present

## 2022-02-22 NOTE — ED Provider Notes (Signed)
[facility] EMERGENCY DEPARTMENT Provider Note   CSN: [id number] Arrival date & time: 02/21/22  1751     History  Chief Complaint  Patient presents with   catheter issues    Caleb [name] is a 87 y.o. male.  Patient reports his Foley catheter stopped draining.  Patient states that the Foley catheter that he has in is too small.  Patient has a Foley secondary to chronic urinary retention.  He reports an episode several days ago of similar and had a 16 Foley placed.  Patient reports a 20 normally works best.  Patient denies any other symptoms he has not had a fever or chills.  He complains of some discomfort from urinary retention.  The history is provided by the patient. No language interpreter was used.      Home Medications Prior to Admission medications   Medication Sig Start Date End Date Taking? Authorizing Provider  acetaminophen (TYLENOL) 325 MG tablet Take 2 tablets (650 mg total) by mouth every 6 (six) hours as needed for mild pain (or Fever >/= 101). 07/28/21   Elgergawy, Silver Huguenin, MD  aspirin EC 81 MG tablet Take 1 tablet (81 mg total) by mouth daily. 09/23/21   Medina-Vargas, Monina C, NP  Calcium Carbonate-Vitamin D (CALCIUM-D PO) Take 1 tablet by mouth daily.    [provider]  lactulose (CHRONULAC) 10 GM/15ML solution Take 15 mLs (10 g total) by mouth 2 (two) times daily as needed for mild constipation. 09/23/21   Medina-Vargas, Monina C, NP  metoprolol tartrate (LOPRESSOR) 25 MG tablet Take 0.5 tablets (12.5 mg total) by mouth 2 (two) times daily. 09/23/21   Medina-Vargas, Monina C, NP  Multiple Vitamin (MULTIVITAMIN WITH MINERALS) TABS tablet Take 1 tablet by mouth daily. Centrum Silver    [provider]  Multiple Vitamins-Minerals (PRESERVISION AREDS 2) CAPS Take by mouth.    [provider]  NON FORMULARY Diet:Heart healthy    [provider]  pantoprazole (PROTONIX) 40 MG tablet Take 1 tablet (40 mg total) by mouth daily. Give  one tablet by mouth twice daily for Upper GI Bleed x 8wks 09/23/21   Medina-Vargas, Monina C, NP  polyethylene glycol powder (GLYCOLAX/MIRALAX) 17 GM/SCOOP powder Take 17 g by mouth daily. 09/23/21   Medina-Vargas, Monina C, NP  simvastatin (ZOCOR) 20 MG tablet Take 1 tablet (20 mg total) by mouth at bedtime. 09/23/21   Medina-Vargas, Monina C, NP      Allergies    Patient has no known allergies.    Review of Systems   Review of Systems  All other systems reviewed and are negative.  Physical Exam Updated Vital Signs BP (!) 157/79    Pulse 98    Temp 98.7 F (37.1 C) (Oral)    Resp 16    Ht 5\' 10"  (1.778 m)    Wt 113.4 kg    SpO2 96%    BMI 35.87 kg/m  Physical Exam Vitals and nursing note reviewed.  Constitutional:      Appearance: He is well-developed.  HENT:     Head: Normocephalic.  Cardiovascular:     Rate and Rhythm: Normal rate.  Pulmonary:     Effort: Pulmonary effort is normal.  Abdominal:     General: Abdomen is flat. There is no distension.     Palpations: Abdomen is soft.  Musculoskeletal:        General: Normal range of motion.     Cervical back: Normal range of motion.  Skin:  General: Skin is warm.  Neurological:     General: No focal deficit present.     Mental Status: He is alert and oriented to person, place, and time.  Psychiatric:        Mood and Affect: Mood normal.    ED Results / Procedures / Treatments   Labs (all labs ordered are listed, but only abnormal results are displayed) Labs Reviewed - No data to display  EKG None  Radiology No results found.  Procedures Procedures    Medications Ordered in ED Medications - No data to display  ED Course/ Medical Decision Making/ A&P Clinical Course as of 02/22/22 1706  Sat Feb 22, 4512  3512 88 year old male complaining of suprapubic pain and decreased output from his Foley catheter.  Catheter was replaced with good improvement in urine flow.  Patient to follow-up with urology. [MB]     Clinical Course User Index [MB] Hayden Rasmussen, MD                           Medical Decision Making Pt complains of foley being obstructed.  Pt reports current catheter is to small   Problems Addressed: Complication of Foley catheter, initial encounter [facility]): acute illness or injury  Amount and/or Complexity of Data Reviewed Independent Historian: friend External Data Reviewed: notes.    Details: Primary care and urology notes reviewed   RN placed a 22 French catheter.  Patient had approximately 1400 cc of urine output he reports he feels much better.  Patient is advised to return if any problems he is to follow-up with his urologist for recheck.        Final Clinical Impression(s) / ED Diagnoses Final diagnoses:  Complication of Foley catheter, initial encounter [facility])    Rx / DC Orders ED Discharge Orders     None      An After Visit Summary was printed and given to the patient.    Fransico Meadow, [place] 02/22/22 1712    Hayden Rasmussen, MD 03/05/22 [phone]

## 2022-02-23 DIAGNOSIS — C609 Malignant neoplasm of penis, unspecified: Secondary | ICD-10-CM | POA: Diagnosis not present

## 2022-02-23 DIAGNOSIS — E6609 Other obesity due to excess calories: Secondary | ICD-10-CM | POA: Diagnosis not present

## 2022-02-23 DIAGNOSIS — Z6833 Body mass index (BMI) 33.0-33.9, adult: Secondary | ICD-10-CM | POA: Diagnosis not present

## 2022-02-23 DIAGNOSIS — T839XXD Unspecified complication of genitourinary prosthetic device, implant and graft, subsequent encounter: Secondary | ICD-10-CM | POA: Diagnosis not present

## 2022-02-24 ENCOUNTER — Telehealth: Payer: Self-pay

## 2022-02-24 DIAGNOSIS — I10 Essential (primary) hypertension: Secondary | ICD-10-CM | POA: Diagnosis not present

## 2022-02-24 DIAGNOSIS — E782 Mixed hyperlipidemia: Secondary | ICD-10-CM | POA: Diagnosis not present

## 2022-02-24 DIAGNOSIS — I480 Paroxysmal atrial fibrillation: Secondary | ICD-10-CM | POA: Diagnosis not present

## 2022-02-24 DIAGNOSIS — R338 Other retention of urine: Secondary | ICD-10-CM | POA: Diagnosis not present

## 2022-02-24 DIAGNOSIS — K922 Gastrointestinal hemorrhage, unspecified: Secondary | ICD-10-CM | POA: Diagnosis not present

## 2022-02-24 DIAGNOSIS — N319 Neuromuscular dysfunction of bladder, unspecified: Secondary | ICD-10-CM | POA: Diagnosis not present

## 2022-02-24 DIAGNOSIS — M6282 Rhabdomyolysis: Secondary | ICD-10-CM | POA: Diagnosis not present

## 2022-02-24 NOTE — Telephone Encounter (Signed)
Received call from patient Chronic Care team patient went to ER on 02/25 for foley not draining.  I called patient- has appt to see Dr. Alyson Ingles on 03/29- will keep catheter in until then as it was changed on 02/25 and call our office if any problems arise.   Per ER note- #22 Fr catheter was placed.

## 2022-03-17 DIAGNOSIS — I2510 Atherosclerotic heart disease of native coronary artery without angina pectoris: Secondary | ICD-10-CM | POA: Diagnosis not present

## 2022-03-17 DIAGNOSIS — Z6834 Body mass index (BMI) 34.0-34.9, adult: Secondary | ICD-10-CM | POA: Diagnosis not present

## 2022-03-17 DIAGNOSIS — A46 Erysipelas: Secondary | ICD-10-CM | POA: Diagnosis not present

## 2022-03-17 DIAGNOSIS — E6609 Other obesity due to excess calories: Secondary | ICD-10-CM | POA: Diagnosis not present

## 2022-03-19 DIAGNOSIS — R338 Other retention of urine: Secondary | ICD-10-CM | POA: Diagnosis not present

## 2022-03-19 DIAGNOSIS — I10 Essential (primary) hypertension: Secondary | ICD-10-CM | POA: Diagnosis not present

## 2022-03-19 DIAGNOSIS — I480 Paroxysmal atrial fibrillation: Secondary | ICD-10-CM | POA: Diagnosis not present

## 2022-03-19 DIAGNOSIS — R531 Weakness: Secondary | ICD-10-CM | POA: Diagnosis not present

## 2022-03-19 DIAGNOSIS — N319 Neuromuscular dysfunction of bladder, unspecified: Secondary | ICD-10-CM | POA: Diagnosis not present

## 2022-03-19 DIAGNOSIS — M6282 Rhabdomyolysis: Secondary | ICD-10-CM | POA: Diagnosis not present

## 2022-03-19 DIAGNOSIS — K922 Gastrointestinal hemorrhage, unspecified: Secondary | ICD-10-CM | POA: Diagnosis not present

## 2022-03-20 DIAGNOSIS — Z6833 Body mass index (BMI) 33.0-33.9, adult: Secondary | ICD-10-CM | POA: Diagnosis not present

## 2022-03-20 DIAGNOSIS — E6609 Other obesity due to excess calories: Secondary | ICD-10-CM | POA: Diagnosis not present

## 2022-03-20 DIAGNOSIS — A46 Erysipelas: Secondary | ICD-10-CM | POA: Diagnosis not present

## 2022-03-23 ENCOUNTER — Ambulatory Visit: Payer: Medicare Other

## 2022-03-24 ENCOUNTER — Encounter (HOSPITAL_COMMUNITY): Payer: Self-pay | Admitting: Physical Therapy

## 2022-03-24 ENCOUNTER — Other Ambulatory Visit: Payer: Self-pay

## 2022-03-24 ENCOUNTER — Ambulatory Visit (HOSPITAL_COMMUNITY): Payer: Medicare Other | Attending: Family Medicine | Admitting: Physical Therapy

## 2022-03-24 DIAGNOSIS — R2689 Other abnormalities of gait and mobility: Secondary | ICD-10-CM

## 2022-03-24 DIAGNOSIS — L989 Disorder of the skin and subcutaneous tissue, unspecified: Secondary | ICD-10-CM | POA: Diagnosis not present

## 2022-03-24 DIAGNOSIS — R600 Localized edema: Secondary | ICD-10-CM | POA: Diagnosis not present

## 2022-03-24 NOTE — Therapy (Signed)
[facility] [address] [place], [place], [address] Phone: [phone]   Fax:  [phone]  Wound Care Evaluation  Patient Details  Name: Caleb [name] MRN: [id number] Date of Birth: [birth year] Referring Provider (PT): Assunta Found MD   Encounter Date: 03/24/2022   PT End of Session - 03/24/22 1400     Visit Number 1    Number of Visits 12    Date for PT Re-Evaluation 05/05/22    Authorization Type Primary: medicare Secondary A[employer]    Progress Note Due on Visit 10    PT Start Time 1310    PT Stop Time 1354    PT Time Calculation (min) 44 min    Activity Tolerance Patient tolerated treatment well    Behavior During Therapy [facility] for tasks assessed/performed             Past Medical History:  Diagnosis Date   Coronary artery disease    Hyperlipidemia    Obesity    Penile cancer (HCC)    Swelling of extremity    Left Leg    Past Surgical History:  Procedure Laterality Date   APPENDECTOMY     BACK SURGERY     BIOPSY  07/22/2021   Procedure: BIOPSY;  Surgeon: Lynann Bologna, MD;  Location: [facility] ENDOSCOPY;  Service: Gastroenterology;;   CHOLECYSTECTOMY     CORONARY ANGIOPLASTY  11/2005   RCA PCI AND STENTING WITH A CYPHER DES   CORONARY ARTERY BYPASS GRAFT     ESOPHAGOGASTRODUODENOSCOPY (EGD) WITH PROPOFOL N/A 07/22/2021   Procedure: ESOPHAGOGASTRODUODENOSCOPY (EGD) WITH PROPOFOL;  Surgeon: Lynann Bologna, MD;  Location: [facility] ENDOSCOPY;  Service: Gastroenterology;  Laterality: N/A;   HIATAL HERNIA REPAIR     lymph node removal     NM MYOCAR MULTIPLE W/SPECT  11/26/2009   EF 62%. NORMAL MYOCARDIAL PERFUSION STUDY.   TRANSTHORACIC ECHOCARDIOGRAM  12/22/2005   MILD TO MOD AORTIC SCLEROSIS W/O STENOSIS. MILD TO MODERATE MITRAL CALCIFICATION. LA- MILDLY DILATED.    There were no vitals filed for this visit.     [facility] PT Assessment - 03/24/22 0001       Assessment   Medical Diagnosis Lesion of RLE    Referring Provider (PT)  Assunta Found MD                OUTPATIENT PHYSICAL THERAPY ONCOLOGY EVALUATION  Patient Name: Caleb [name] MRN: [id number] DOB:[birth year], 88 y.o., male Today's Date: 03/24/2022   PT End of Session - 03/24/22 1400     Visit Number 1    Number of Visits 12    Date for PT Re-Evaluation 05/05/22    Authorization Type Primary: medicare Secondary AARP    Progress Note Due on Visit 10    PT Start Time 1310    PT Stop Time 1354    PT Time Calculation (min) 44 min    Activity Tolerance Patient tolerated treatment well    Behavior During Therapy WFL for tasks assessed/performed             Past Medical History:  Diagnosis Date   Coronary artery disease    Hyperlipidemia    Obesity    Penile cancer (HCC)    Swelling of extremity    Left Leg   Past Surgical History:  Procedure Laterality Date   APPENDECTOMY     BACK SURGERY     BIOPSY  07/22/2021   Procedure: BIOPSY;  Surgeon: Lynann Bologna, MD;  Location: [facility] ENDOSCOPY;  Service: Gastroenterology;;   CHOLECYSTECTOMY     CORONARY ANGIOPLASTY  11/2005   RCA PCI AND STENTING WITH A CYPHER DES   CORONARY ARTERY BYPASS GRAFT     ESOPHAGOGASTRODUODENOSCOPY (EGD) WITH PROPOFOL N/A 07/22/2021   Procedure: ESOPHAGOGASTRODUODENOSCOPY (EGD) WITH PROPOFOL;  Surgeon: Lynann Bologna, MD;  Location: [facility] ENDOSCOPY;  Service: Gastroenterology;  Laterality: N/A;   HIATAL HERNIA REPAIR     lymph node removal     NM MYOCAR MULTIPLE W/SPECT  11/26/2009   EF 62%. NORMAL MYOCARDIAL PERFUSION STUDY.   TRANSTHORACIC ECHOCARDIOGRAM  12/22/2005   MILD TO MOD AORTIC SCLEROSIS W/O STENOSIS. MILD TO MODERATE MITRAL CALCIFICATION. LA- MILDLY DILATED.   Patient Active Problem List   Diagnosis Date Noted   Foley catheter problem (HCC) 02/02/2022   AKI (acute kidney injury) (HCC) 07/31/2021   Multiple duodenal ulcers 07/31/2021   Neurocognitive deficits 07/31/2021   Hematemesis 07/19/2021   Hyperkalemia 07/19/2021   Leukocytosis 07/19/2021    SIRS (systemic inflammatory response syndrome) (HCC) 07/19/2021   Acute blood loss anemia 07/19/2021   Transient hypotension 07/19/2021   GIB (gastrointestinal bleeding) 07/19/2021   Antiplatelet or antithrombotic long-term use    CKD (chronic kidney disease), stage III (HCC) 07/17/2021   Macrocytic anemia 07/17/2021   Atrial fibrillation (HCC) 07/17/2021   Pressure ulcers of skin of multiple topographic sites 07/17/2021   Rhabdomyolysis 07/11/2021   Fall at home, initial encounter 07/11/2021   Acute lower UTI 07/11/2021   Urinary retention 03/27/2020   Exudative age-related macular degeneration, right eye, with active choroidal neovascularization (HCC) 08/23/2017   BMI 32.0-32.9,adult 02/26/2016   Neurogenic bladder 02/26/2016   Scalp hematoma 01/13/2016   Self-catheterizes urinary bladder 01/13/2016   Pneumothorax, left 01/12/2016   Atypical chest pain 12/02/2015   Muscular deconditioning 02/20/2015   History of penile cancer 01/09/2015   Cellulitis 05/17/2014   Pain in limb- Slight pain- Left Leg 05/03/2014   Discoloration of skin-Left Leg 05/03/2014   Stasis edema with ulcer (HCC) 08/17/2013   Peripheral vascular disease, unspecified (HCC) 07/13/2013   Swelling of limb 07/13/2013   Coronary artery disease 06/07/2013   Left upper extremity numbness 06/07/2013   Hyperlipidemia    Knee pain 08/11/2011    PCP: Assunta Found, MD  REFERRING PROVIDER: Assunta Found, MD  REFERRING DIAG: Lesion of RLE  THERAPY DIAG:  Edema of right lower extremity  Skin lesion of right lower extremity  Other abnormalities of gait and mobility  ONSET DATE: several years  SUBJECTIVE  SUBJECTIVE STATEMENT: Patient states RLE swelling that has been going on for years. Wore compression garments years but  stopped wearing them.  PERTINENT HISTORY:   hx prostate cancer  PAIN:  Are you having pain? No NPRS scale: 0/10 Pain location: n/a Pain orientation: Other: none   PAIN TYPE:  Pain description:      Aggravating factors:  Relieving factors:   PRECAUTIONS: Fall  WEIGHT BEARING RESTRICTIONS No  FALLS:  Has patient fallen in last 6 months? No   OCCUPATION: retired   PATIENT GOALS decrease swelling   OBJECTIVE  COGNITION:  Overall cognitive status: Within functional limits for tasks assessed   PALPATION: 3+ pitting edema RLE  OBSERVATIONS / OTHER ASSESSMENTS: severe RLE edema, slight LLE, R pitting edema, hemosiderin staining    LE AROM/PROM:  A/PROM Right 03/24/2022  Hip flexion   Hip extension   Hip abduction   Hip adduction   Hip internal rotation   Hip external rotation   Knee flexion   Knee extension   Ankle dorsiflexion   Ankle plantarflexion   Ankle inversion   Ankle eversion    (Blank rows = not tested)  A/PROM LEFT 03/24/2022  Hip flexion   Hip extension   Hip abduction   Hip adduction   Hip internal rotation   Hip external rotation   Knee flexion   Knee extension   Ankle dorsiflexion   Ankle plantarflexion   Ankle inversion   Ankle eversion    (Blank rows = not tested)  LE MMT:     LYMPHEDEMA ASSESSMENTS:      LE LANDMARK RIGHT 03/24/2022  At groin   30 cm proximal to suprapatella   20 cm proximal to suprapatella   10 cm proximal to suprapatella   At midpatella / popliteal crease   30 cm proximal to floor at lateral plantar foot 51 cm  20 cm proximal to floor at lateral plantar foot 32 cm  10 cm proximal to floor at lateral plantar foot 33 cm  Circumference of ankle/heel 33 cm  5 cm proximal to 1st MTP joint 26 cm  Across MTP joint 26 cm  Around proximal great toe   (Blank rows = not tested)  LE LANDMARK LEFT 03/24/2022  At groin   30 cm proximal to suprapatella   20 cm proximal to suprapatella   10 cm proximal to  suprapatella   At midpatella / popliteal crease   30 cm proximal to floor at lateral plantar foot   20 cm proximal to floor at lateral plantar foot   10 cm proximal to floor at lateral plantar foot   Circumference of ankle/heel   5 cm proximal to 1st MTP joint   Across MTP joint   Around proximal great toe   (Blank rows = not tested)   GAIT: Distance walked: 150 feet  Assistive device utilized: Environmental consultant - 4 wheeled Level of assistance: Modified independence Comments: slow, labored cadence with rollator; impaired L ankle DF strength   TODAY'S TREATMENT  Edema massage to RLE with lotion 9 minutes Vaseline, profore lite wrap, netting   PATIENT EDUCATION:  Education details: Patient educated on exam findings, POC, scope of PT, HEP. Person educated: Patient Education method: Medical illustrator Education comprehension: verbalized understanding and returned demonstration   HOME EXERCISE PROGRAM: Ankle pumps  ASSESSMENT:  CLINICAL IMPRESSION: Patient is a 88 y.o. male who was seen today for physical therapy evaluation and treatment for lesion of RLE. Patient without wound present but dry  and cracking skin, pitting edema and hemosiderin staining. Patient with history of RLE vascular disease but also with possible lymphedema contributing to symptoms. Patient will continue to benefit from physical therapy in order to improve function and reduce impairment.     OBJECTIVE IMPAIRMENTS Abnormal gait, decreased activity tolerance, decreased balance, decreased endurance, decreased mobility, difficulty walking, decreased ROM, increased edema, impaired perceived functional ability, impaired flexibility, and improper body mechanics.   ACTIVITY LIMITATIONS cleaning, community activity, laundry, yard work, shopping, and yard work.   PERSONAL FACTORS Age, Fitness, and Time since onset of injury/illness/exacerbation are also affecting patient's functional outcome. hx cancer, PVD, CAD,  increased BMI, sedentary   REHAB POTENTIAL: Fair    CLINICAL DECISION MAKING: Evolving/moderate complexity  EVALUATION COMPLEXITY: Moderate          PT Education - 03/24/22 1300     Education Details Patient educated on exam findings, POC, scope of PT, ankle pumps    Person(s) Educated Patient    Methods Explanation;Demonstration    Comprehension Verbalized understanding;Returned demonstration              PT Short Term Goals - 03/24/22 1408       PT SHORT TERM GOAL #1   Title Patient will experience a 50% reduction in RLE edema for improved ability to dress.    Time 3    Period Weeks    Status New    Target Date 04/14/22               PT Long Term Goals - 03/24/22 1409       PT LONG TERM GOAL #1   Title Patient will verbalize under standing of proper skin care and compression garment use.    Time 6    Period Weeks    Status New    Target Date 05/05/22                  Plan - 03/24/22 1402     Clinical Impression Statement see above    Personal Factors and Comorbidities Age;Comorbidity 3+;Fitness;Time since onset of injury/illness/exacerbation    Comorbidities hx cancer, PVD, CAD, increased BMI, sedentary    Examination-Activity Limitations Lift;Locomotion Level;Transfers;Stand;Stairs;Squat;Bend;Hygiene/Grooming    Examination-Participation Restrictions Community Activity;Yard Work;Shop    Stability/Clinical Decision Making Evolving/Moderate complexity    Clinical Decision Making Moderate    Rehab Potential Fair    PT Frequency 2x / week    PT Duration 6 weeks    PT Treatment/Interventions ADLs/Self Care Home Management;Taping;Scar mobilization;Compression bandaging;Manual lymph drainage;Manual techniques;Orthotic Fit/Training;Patient/family education;DME Instruction;Gait training;Stair training;Functional mobility training;Therapeutic activities;Therapeutic exercise;Balance training;Neuromuscular re-education    PT Next Visit Plan manual  and wrapping for edema    PT Home Exercise Plan ankle pumps    Consulted and Agree with Plan of Care Patient             Patient will benefit from skilled therapeutic intervention in order to improve the following deficits and impairments:  Abnormal gait, Decreased balance, Decreased mobility, Difficulty walking, Increased edema, Decreased strength, Decreased activity tolerance, Pain, Decreased range of motion  Visit Diagnosis: Edema of right lower extremity  Skin lesion of right lower extremity  Other abnormalities of gait and mobility    Problem List Patient Active Problem List   Diagnosis Date Noted   Foley catheter problem (HCC) 02/02/2022   AKI (acute kidney injury) (HCC) 07/31/2021   Multiple duodenal ulcers 07/31/2021   Neurocognitive deficits 07/31/2021   Hematemesis 07/19/2021   Hyperkalemia 07/19/2021  Leukocytosis 07/19/2021   SIRS (systemic inflammatory response syndrome) (HCC) 07/19/2021   Acute blood loss anemia 07/19/2021   Transient hypotension 07/19/2021   GIB (gastrointestinal bleeding) 07/19/2021   Antiplatelet or antithrombotic long-term use    CKD (chronic kidney disease), stage III (HCC) 07/17/2021   Macrocytic anemia 07/17/2021   Atrial fibrillation (HCC) 07/17/2021   Pressure ulcers of skin of multiple topographic sites 07/17/2021   Rhabdomyolysis 07/11/2021   Fall at home, initial encounter 07/11/2021   Acute lower UTI 07/11/2021   Urinary retention 03/27/2020   Exudative age-related macular degeneration, right eye, with active choroidal neovascularization (HCC) 08/23/2017   BMI 32.0-32.9,adult 02/26/2016   Neurogenic bladder 02/26/2016   Scalp hematoma 01/13/2016   Self-catheterizes urinary bladder 01/13/2016   Pneumothorax, left 01/12/2016   Atypical chest pain 12/02/2015   Muscular deconditioning 02/20/2015   History of penile cancer 01/09/2015   Cellulitis 05/17/2014   Pain in limb- Slight pain- Left Leg 05/03/2014   Discoloration  of skin-Left Leg 05/03/2014   Stasis edema with ulcer (HCC) 08/17/2013   Peripheral vascular disease, unspecified (HCC) 07/13/2013   Swelling of limb 07/13/2013   Coronary artery disease 06/07/2013   Left upper extremity numbness 06/07/2013   Hyperlipidemia    Knee pain 08/11/2011    2:41 PM, 03/24/22 Wyman Songster PT, DPT Physical Therapist at [facility]   [facility] [address] [place], [place], [address] Phone: [phone]   Fax:  [phone]  Name: Caleb [name] MRN: [id number] Date of Birth: 03/13/1925

## 2022-03-25 ENCOUNTER — Encounter: Payer: Self-pay | Admitting: Urology

## 2022-03-25 ENCOUNTER — Ambulatory Visit (INDEPENDENT_AMBULATORY_CARE_PROVIDER_SITE_OTHER): Payer: Medicare Other | Admitting: Urology

## 2022-03-25 VITALS — BP 122/67 | HR 112

## 2022-03-25 DIAGNOSIS — R2689 Other abnormalities of gait and mobility: Secondary | ICD-10-CM | POA: Diagnosis not present

## 2022-03-25 DIAGNOSIS — R531 Weakness: Secondary | ICD-10-CM | POA: Diagnosis not present

## 2022-03-25 DIAGNOSIS — R339 Retention of urine, unspecified: Secondary | ICD-10-CM

## 2022-03-25 DIAGNOSIS — Z9181 History of falling: Secondary | ICD-10-CM | POA: Diagnosis not present

## 2022-03-25 DIAGNOSIS — N319 Neuromuscular dysfunction of bladder, unspecified: Secondary | ICD-10-CM

## 2022-03-25 DIAGNOSIS — M2569 Stiffness of other specified joint, not elsewhere classified: Secondary | ICD-10-CM | POA: Diagnosis not present

## 2022-03-25 NOTE — Patient Instructions (Signed)

## 2022-03-25 NOTE — Progress Notes (Signed)
? ?03/25/2022 ?11:55 AM  ? ?Caleb [name] ?[birth year] ?[id number] ? ?Referring provider: Sharilyn Sites, MD ?[address] ?[place],  [place] [address] ? ?Followup urinary retention ? ? ?HPI: ?Caleb [name] is a 88yo here for followup for urinary retention and neurogenic bladder. He had issues with a clogged foley and was switched to a 22 french foley. He now uses a 22 french foley in his perineal urethrostomy. No hematuria. No UTIs. No ew complaints today ? ? ?PMH: ?Past Medical History:  ?Diagnosis Date  ? Coronary artery disease   ? Hyperlipidemia   ? Obesity   ? Penile cancer ([place])   ? Swelling of extremity   ? Left Leg  ? ? ?Surgical History: ?Past Surgical History:  ?Procedure Laterality Date  ? APPENDECTOMY    ? BACK SURGERY    ? BIOPSY  07/22/2021  ? Procedure: BIOPSY;  Surgeon: Jackquline Denmark, MD;  Location: [place];  Service: Gastroenterology;;  ? CHOLECYSTECTOMY    ? CORONARY ANGIOPLASTY  11/2005  ? RCA PCI AND STENTING WITH A CYPHER DES  ? CORONARY ARTERY BYPASS GRAFT    ? ESOPHAGOGASTRODUODENOSCOPY (EGD) WITH PROPOFOL N/A 07/22/2021  ? Procedure: ESOPHAGOGASTRODUODENOSCOPY (EGD) WITH PROPOFOL;  Surgeon: Jackquline Denmark, MD;  Location: [place];  Service: Gastroenterology;  Laterality: N/A;  ? HIATAL HERNIA REPAIR    ? lymph node removal    ? NM MYOCAR MULTIPLE W/SPECT  11/26/2009  ? EF 62%. NORMAL MYOCARDIAL PERFUSION STUDY.  ? TRANSTHORACIC ECHOCARDIOGRAM  12/22/2005  ? MILD TO MOD AORTIC SCLEROSIS W/O STENOSIS. MILD TO MODERATE MITRAL CALCIFICATION. LA- MILDLY DILATED.  ? ? ?Home Medications:  ?Allergies as of 03/25/2022   ?No Known Allergies ?  ? ?  ?Medication List  ?  ? ?  ? Accurate as of March 25, 2022 11:55 AM. If you have any questions, ask your nurse or doctor.  ?  ?  ? ?  ? ?acetaminophen 325 MG tablet ?Commonly known as: TYLENOL ?Take 2 tablets (650 mg total) by mouth every 6 (six) hours as needed for mild pain (or Fever >/= 101). ?  ?aspirin EC 81 MG tablet ?Take 1 tablet (81 mg total) by  mouth daily. ?  ?CALCIUM-D PO ?Take 1 tablet by mouth daily. ?  ?lactulose 10 GM/15ML solution ?Commonly known as: [place] ?Take 15 mLs (10 g total) by mouth 2 (two) times daily as needed for mild constipation. ?  ?metoprolol tartrate 25 MG tablet ?Commonly known as: LOPRESSOR ?Take 0.5 tablets (12.5 mg total) by mouth 2 (two) times daily. ?  ?multivitamin with minerals Tabs tablet ?Take 1 tablet by mouth daily. Centrum Silver ?  ?NON FORMULARY ?Diet:Heart healthy ?  ?pantoprazole 40 MG tablet ?Commonly known as: PROTONIX ?Take 1 tablet (40 mg total) by mouth daily. Give one tablet by mouth twice daily for Upper GI Bleed x 8wks ?  ?polyethylene glycol powder 17 GM/SCOOP powder ?Commonly known as: GLYCOLAX/MIRALAX ?Take 17 g by mouth daily. ?  ?PreserVision AREDS 2 Caps ?Take by mouth. ?  ?simvastatin 20 MG tablet ?Commonly known as: ZOCOR ?Take 1 tablet (20 mg total) by mouth at bedtime. ?  ? ?  ? ? ?Allergies: No Known Allergies ? ?Family History: ?Family History  ?Problem Relation Age of Onset  ? Cancer Father   ? Cancer Sister   ? Cancer Brother   ? Cancer Sister   ? Cancer Other   ? ? ?Social History:  reports that he quit smoking about 38 years ago. His smoking  use included cigarettes. He has never used smokeless tobacco. He reports that he does not drink alcohol and does not use drugs. ? ?ROS: ?All other review of systems were reviewed and are negative except what is noted above in HPI ? ?Physical Exam: ?There were no vitals taken for this visit.  ?Constitutional:  Alert and oriented, No acute distress. ?HEENT: [place] AT, moist mucus membranes.  Trachea midline, no masses. ?Cardiovascular: No clubbing, cyanosis, or edema. ?Respiratory: Normal respiratory effort, no increased work of breathing. ?GI: Abdomen is soft, nontender, nondistended, no abdominal masses ?GU: No CVA tenderness.  ?Lymph: No cervical or inguinal lymphadenopathy. ?Skin: No rashes, bruises or suspicious lesions. ?Neurologic: Grossly intact, no  focal deficits, moving all 4 extremities. ?Psychiatric: Normal mood and affect. ? ?Laboratory Data: ?Lab Results  ?Component Value Date  ? WBC 6.4 11/12/2021  ? HGB 10.7 (L) 11/12/2021  ? HCT 33.4 (L) 11/12/2021  ? MCV 99.1 11/12/2021  ? PLT 174 11/12/2021  ? ? ?Lab Results  ?Component Value Date  ? CREATININE 1.43 (H) 11/12/2021  ? ? ?No results found for: PSA ? ?No results found for: TESTOSTERONE ? ?Lab Results  ?Component Value Date  ? HGBA1C 5.3 11/12/2021  ? ? ?Urinalysis ?   ?Component Value Date/Time  ? [place] YELLOW 02/01/2022 1829  ? APPEARANCEUR CLOUDY (A) 02/01/2022 1829  ? LABSPEC 1.015 02/01/2022 1829  ? PHURINE 8.5 (H) 02/01/2022 1829  ? [place] NEGATIVE 02/01/2022 1829  ? HGBUR LARGE (A) 02/01/2022 1829  ? [place] NEGATIVE 02/01/2022 1829  ? BILIRUBINUR negative 03/25/2020 1156  ? [place] NEGATIVE 02/01/2022 1829  ? PROTEINUR >300 (A) 02/01/2022 1829  ? UROBILINOGEN 0.2 03/25/2020 1156  ? NITRITE NEGATIVE 02/01/2022 1829  ? LEUKOCYTESUR MODERATE (A) 02/01/2022 1829  ? ? ?Lab Results  ?Component Value Date  ? BACTERIA MANY (A) 02/01/2022  ? ? ?Pertinent Imaging: ? ?Results for orders placed during the hospital encounter of 02/17/22 ? ?DG Abdomen 1 View ? ?Narrative ?CLINICAL DATA:  Constipation, urine retention ? ?EXAM: ?ABDOMEN - 1 VIEW ? ?COMPARISON:  01/09/2018 ? ?FINDINGS: ?Curvilinear lucency projects along the medial left hemidiaphragm. ?Gas distended nondilated small bowel and colon. Iliac arterial ?calcifications. Lower lumbar spondylitic change. Surgical clips over ?the left hip. ? ?IMPRESSION: ?1. Nonobstructive bowel gas pattern. ?2. Nonspecific linear lucency adjacent to the left diaphragmatic ?leaflet. Recommend horizontal beam radiograph if free ?intraperitoneal gas is a clinical concern. ? ? ?Electronically Signed ?By: Lucrezia Europe M.D. ?On: 02/17/2022 16:49 ? ?Results for orders placed during the hospital encounter of 05/13/05 ? ?US VenoUS Imaging  Bilateral ? ?Narrative ?Clinical data: Bilateral lower extremity edema and pain ? ?Bilateral lower extremity venous Doppler ultrasound: ? ?The lower extremity deep venous system demonstrates normal compressibility, ?phasicity, and augmentation.   No evidence of DVT. ? ?Impression: ?1. Negative for   lower extremity DVT ? ?Provider: Mertie Clause ? ?No results found for this or any previous visit. ? ?No results found for this or any previous visit. ? ?No results found for this or any previous visit. ? ?No results found for this or any previous visit. ? ?No results found for this or any previous visit. ? ?No results found for this or any previous visit. ? ? ?Assessment & Plan:   ? ?1. Urinary retention ?Continue monthly foley changes ? ?2. Neurogenic bladder ?-Continue 22 french indwelling foley ? ? ?No follow-ups on file. ? ?Nicolette Bang, MD ? ?[facility] [place] ?  ?

## 2022-03-25 NOTE — Progress Notes (Signed)
Cath Change/ Replacement ? ?Patient is present today for a catheter change due to urinary retention.  48m of water was removed from the balloon, a 22FR foley cath was removed with out difficulty.  Patient was cleaned and prepped in a sterile fashion with betadine. A 22 FR foley cath was replaced into the bladder no complications were noted Urine return was noted 515mand urine was yellow in color. The balloon was filled with 1042mf sterile water. A leg bag was attached for drainage.  A night bag was also given to the patient and patient was given instruction on how to change from one bag to another. Patient was given proper instruction on catheter care.   ? ?Performed by: Robbyn Hodkinson Lpn  ? ?Follow up: 1 month cath change ?

## 2022-03-27 DIAGNOSIS — R531 Weakness: Secondary | ICD-10-CM | POA: Diagnosis not present

## 2022-03-27 DIAGNOSIS — M2569 Stiffness of other specified joint, not elsewhere classified: Secondary | ICD-10-CM | POA: Diagnosis not present

## 2022-03-27 DIAGNOSIS — R2689 Other abnormalities of gait and mobility: Secondary | ICD-10-CM | POA: Diagnosis not present

## 2022-03-27 DIAGNOSIS — Z9181 History of falling: Secondary | ICD-10-CM | POA: Diagnosis not present

## 2022-03-31 DIAGNOSIS — R2689 Other abnormalities of gait and mobility: Secondary | ICD-10-CM | POA: Diagnosis not present

## 2022-03-31 DIAGNOSIS — M2569 Stiffness of other specified joint, not elsewhere classified: Secondary | ICD-10-CM | POA: Diagnosis not present

## 2022-03-31 DIAGNOSIS — R531 Weakness: Secondary | ICD-10-CM | POA: Diagnosis not present

## 2022-03-31 DIAGNOSIS — Z9181 History of falling: Secondary | ICD-10-CM | POA: Diagnosis not present

## 2022-04-02 ENCOUNTER — Ambulatory Visit (HOSPITAL_COMMUNITY): Payer: Medicare Other | Attending: Family Medicine | Admitting: Physical Therapy

## 2022-04-02 ENCOUNTER — Encounter (HOSPITAL_COMMUNITY): Payer: Self-pay | Admitting: Physical Therapy

## 2022-04-02 DIAGNOSIS — R600 Localized edema: Secondary | ICD-10-CM | POA: Diagnosis not present

## 2022-04-02 DIAGNOSIS — R2689 Other abnormalities of gait and mobility: Secondary | ICD-10-CM | POA: Diagnosis not present

## 2022-04-02 DIAGNOSIS — L989 Disorder of the skin and subcutaneous tissue, unspecified: Secondary | ICD-10-CM | POA: Insufficient documentation

## 2022-04-02 NOTE — Therapy (Addendum)
?OUTPATIENT PHYSICAL THERAPY TREATMENT NOTE ? ? ?Patient Name: Caleb [name] ?MRN: [id number] ?DOB:03/27/1925, 88 y.o., male ?Today's Date: 04/02/2022 ? ?PCP: Sharilyn Sites, MD ?REFERRING PROVIDER: Sharilyn Sites, MD ? ?END OF SESSION:  ? PT End of Session - 04/02/22 1134   ? ? Visit Number 2   ? Number of Visits 12   ? Date for PT Re-Evaluation 05/05/22   ? Authorization Type Primary: medicare Secondary [employer]   ? Progress Note Due on Visit 10   ? PT Start Time 1050   ? PT Stop Time 1130   ? PT Time Calculation (min) 40 min   ? Activity Tolerance Patient tolerated treatment well   ? Behavior During Therapy [facility] for tasks assessed/performed   ? ?  ?  ? ?  ? ? ?Past Medical History:  ?Diagnosis Date  ? Coronary artery disease   ? Hyperlipidemia   ? Obesity   ? Penile cancer ([place])   ? Swelling of extremity   ? Left Leg  ? ?Past Surgical History:  ?Procedure Laterality Date  ? APPENDECTOMY    ? BACK SURGERY    ? BIOPSY  07/22/2021  ? Procedure: BIOPSY;  Surgeon: Jackquline Denmark, MD;  Location: [place];  Service: Gastroenterology;;  ? CHOLECYSTECTOMY    ? CORONARY ANGIOPLASTY  11/2005  ? RCA PCI AND STENTING WITH A CYPHER DES  ? CORONARY ARTERY BYPASS GRAFT    ? ESOPHAGOGASTRODUODENOSCOPY (EGD) WITH PROPOFOL N/A 07/22/2021  ? Procedure: ESOPHAGOGASTRODUODENOSCOPY (EGD) WITH PROPOFOL;  Surgeon: Jackquline Denmark, MD;  Location: [place];  Service: Gastroenterology;  Laterality: N/A;  ? HIATAL HERNIA REPAIR    ? lymph node removal    ? NM MYOCAR MULTIPLE W/SPECT  11/26/2009  ? EF 62%. NORMAL MYOCARDIAL PERFUSION STUDY.  ? TRANSTHORACIC ECHOCARDIOGRAM  12/22/2005  ? MILD TO MOD AORTIC SCLEROSIS W/O STENOSIS. MILD TO MODERATE MITRAL CALCIFICATION. LA- MILDLY DILATED.  ? ?Patient Active Problem List  ? Diagnosis Date Noted  ? Foley catheter problem ([place]) 02/02/2022  ? AKI (acute kidney injury) ([place]) 07/31/2021  ? Multiple duodenal ulcers 07/31/2021  ? Neurocognitive deficits 07/31/2021  ? Hematemesis 07/19/2021  ? Hyperkalemia  07/19/2021  ? Leukocytosis 07/19/2021  ? SIRS (systemic inflammatory response syndrome) ([place]) 07/19/2021  ? Acute blood loss anemia 07/19/2021  ? Transient hypotension 07/19/2021  ? GIB (gastrointestinal bleeding) 07/19/2021  ? Antiplatelet or antithrombotic long-term use   ? CKD (chronic kidney disease), stage III ([place]) 07/17/2021  ? Macrocytic anemia 07/17/2021  ? Atrial fibrillation ([place]) 07/17/2021  ? Pressure ulcers of skin of multiple topographic sites 07/17/2021  ? Rhabdomyolysis 07/11/2021  ? Fall at home, initial encounter 07/11/2021  ? Acute lower UTI 07/11/2021  ? Urinary retention 03/27/2020  ? Exudative age-related macular degeneration, right eye, with active choroidal neovascularization ([place]) 08/23/2017  ? BMI 32.0-32.9,adult 02/26/2016  ? Neurogenic bladder 02/26/2016  ? Scalp hematoma 01/13/2016  ? Self-catheterizes urinary bladder 01/13/2016  ? Pneumothorax, left 01/12/2016  ? Atypical chest pain 12/02/2015  ? Muscular deconditioning 02/20/2015  ? History of penile cancer 01/09/2015  ? Cellulitis 05/17/2014  ? Pain in limb- Slight pain- Left Leg 05/03/2014  ? Discoloration of skin-Left Leg 05/03/2014  ? Stasis edema with ulcer ([place]) 08/17/2013  ? Peripheral vascular disease, unspecified ([place]) 07/13/2013  ? Swelling of limb 07/13/2013  ? Coronary artery disease 06/07/2013  ? Left upper extremity numbness 06/07/2013  ? Hyperlipidemia   ? Knee pain 08/11/2011  ? ? ?REFERRING DIAG: Lesion on right lower ext  ? ?  THERAPY DIAG:  ?Edema of right lower extremity ? ?PERTINENT HISTORY:  hx prostate cancer ?  ? ?PRECAUTIONS: falls  ? 04/02/2022 ?SUBJECTIVE: PT states that the bandage became uncomfortable and he had to take it off. ? ?PAIN:  ?Are you having pain? No ?04/02/2022        TX:  Pt has no open wounds on LE but noted induration and paplliomas  ?                        Indicative of lymphedema.  ?Therapist cleansed and moisturized LE, debrided dry skin, cut 1/2" foam for LE bandaging. ?Therapist began  decongestive manual techniques to decreased edema followed by compression  ?Bandaging using foam and profore lite.   ? ?LE LANDMARK RIGHT ?03/24/2022  ?At groin    ?30 cm proximal to suprapatella    ?20 cm proximal to suprapatella    ?10 cm proximal to suprapatella    ?At [place] / popliteal crease    ?30 cm proximal to floor at lateral plantar foot 51 cm  ?20 cm proximal to floor at lateral plantar foot 32 cm  ?10 cm proximal to floor at lateral plantar foot 33 cm  ?Circumference of ankle/heel 33 cm  ?5 cm proximal to 1st MTP joint 26 cm  ?Across MTP joint 26 cm  ?Around proximal great toe    ?(Blank rows = not tested) ?  ?  ?TODAY'S TREATMENT Eval:  ?Edema massage to RLE with lotion 9 minutes ?Vaseline, profore lite wrap, netting ?  ?  ?PATIENT EDUCATION:  ?            3/6:  Lymphedema, how it is treated and its chronic nature.  ?Education details: Patient educated on exam findings, POC, scope of PT, HEP. ?Person educated: Patient ?Education method: Explanation and Demonstration ?Education comprehension: verbalized understanding and returned demonstration ?  ?  ?HOME EXERCISE PROGRAM: ?Ankle pumps ?  ?ASSESSMENT: ?  ?CLINICAL IMPRESSION:  PT Rt LE continues to be indurated with noted palliomas pt most likely has lymphedema and will benefit form  ?Total decongestive techniques followed by compression garment  and pump for maintenance.   ?  ?  ?OBJECTIVE IMPAIRMENTS Abnormal gait, decreased activity tolerance, decreased balance, decreased endurance, decreased mobility, difficulty walking, decreased ROM, increased edema, impaired perceived functional ability, impaired flexibility, and improper body mechanics.  ?  ?ACTIVITY LIMITATIONS cleaning, community activity, laundry, yard work, shopping, and yard work.  ?  ?PERSONAL FACTORS Age, Fitness, and Time since onset of injury/illness/exacerbation are also affecting patient's functional outcome. hx cancer, PVD, CAD, increased BMI, sedentary ?  ?  ?REHAB POTENTIAL:  Fair   ?  ?CLINICAL DECISION MAKING: Evolving/moderate complexity ?  ?EVALUATION COMPLEXITY: Moderate ?  ?  ?  ?  ?  ?  ?  ?  ?  PT Education - 03/24/22 1300   ?  ?  Education Details Patient educated on exam findings, POC, scope of PT, ankle pumps   ?  Person(s) Educated Patient   ?  Methods Explanation;Demonstration   ?  Comprehension Verbalized understanding;Returned demonstration   ?  ?   ?  ?  ?   ?  ?  ?  PT Short Term Goals - 03/24/22 1408   ?  ?    ?     ?  PT SHORT TERM GOAL #1  ?  Title Patient will experience a 50% reduction in RLE edema for improved ability to dress.   ?  Time 3   ?  Period Weeks   ?  Status  On-going   ?  Target Date 04/14/22   ?  ?   ?  ?  ?   ?  ?  ?  ?  PT Long Term Goals - 03/24/22 1409   ?  ?    ?     ?  PT LONG TERM GOAL #1  ?  Title Patient will verbalize under standing of proper skin care and compression garment use.   ?  Time 6   ?  Period Weeks   ?  Status On-going   ?  Target Date 05/05/22   ?  ?   ?  ?  ?   ?  ?  ?  ?  ?  ?  ?  Plan - 03/24/22 1402   ?  ?  Clinical Impression Statement see above   ?  Personal Factors and Comorbidities Age;Comorbidity 3+;Fitness;Time since onset of injury/illness/exacerbation   ?  Comorbidities hx cancer, PVD, CAD, increased BMI, sedentary   ?  Examination-Activity Limitations Lift;Locomotion Level;Transfers;Stand;Stairs;Squat;Bend;Hygiene/Grooming   ?  Examination-Participation Restrictions Community Activity;Yard Work;Shop   ?  Stability/Clinical Decision Making Evolving/Moderate complexity   ?  Clinical Decision Making Moderate   ?  Rehab Potential Fair   ?  PT Frequency 2x / week   ?  PT Duration 6 weeks   ?  PT Treatment/Interventions ADLs/Self Care Home Management;Taping;Scar mobilization;Compression bandaging;Manual lymph drainage;Manual techniques;Orthotic Fit/Training;Patient/family education;DME Instruction;Gait training;Stair training;Functional mobility training;Therapeutic activities;Therapeutic exercise;Balance  training;Neuromuscular re-education   ?  PT Next Visit Plan Begin exercises for lymphedema as well as total decongestive techniques.   ?  PT Home Exercise Plan ankle pumps   ?  Consulted and Agree with Plan of Care Patient   ?

## 2022-04-03 DIAGNOSIS — R531 Weakness: Secondary | ICD-10-CM | POA: Diagnosis not present

## 2022-04-03 DIAGNOSIS — M2569 Stiffness of other specified joint, not elsewhere classified: Secondary | ICD-10-CM | POA: Diagnosis not present

## 2022-04-03 DIAGNOSIS — Z9181 History of falling: Secondary | ICD-10-CM | POA: Diagnosis not present

## 2022-04-03 DIAGNOSIS — R2689 Other abnormalities of gait and mobility: Secondary | ICD-10-CM | POA: Diagnosis not present

## 2022-04-07 DIAGNOSIS — R2689 Other abnormalities of gait and mobility: Secondary | ICD-10-CM | POA: Diagnosis not present

## 2022-04-07 DIAGNOSIS — M2569 Stiffness of other specified joint, not elsewhere classified: Secondary | ICD-10-CM | POA: Diagnosis not present

## 2022-04-07 DIAGNOSIS — Z9181 History of falling: Secondary | ICD-10-CM | POA: Diagnosis not present

## 2022-04-07 DIAGNOSIS — R531 Weakness: Secondary | ICD-10-CM | POA: Diagnosis not present

## 2022-04-09 ENCOUNTER — Ambulatory Visit (HOSPITAL_COMMUNITY): Payer: Medicare Other | Admitting: Physical Therapy

## 2022-04-09 DIAGNOSIS — R600 Localized edema: Secondary | ICD-10-CM | POA: Diagnosis not present

## 2022-04-09 DIAGNOSIS — M2569 Stiffness of other specified joint, not elsewhere classified: Secondary | ICD-10-CM | POA: Diagnosis not present

## 2022-04-09 DIAGNOSIS — R531 Weakness: Secondary | ICD-10-CM | POA: Diagnosis not present

## 2022-04-09 DIAGNOSIS — Z9181 History of falling: Secondary | ICD-10-CM | POA: Diagnosis not present

## 2022-04-09 DIAGNOSIS — R2689 Other abnormalities of gait and mobility: Secondary | ICD-10-CM | POA: Diagnosis not present

## 2022-04-09 DIAGNOSIS — L989 Disorder of the skin and subcutaneous tissue, unspecified: Secondary | ICD-10-CM

## 2022-04-09 NOTE — Therapy (Addendum)
?OUTPATIENT PHYSICAL THERAPY TREATMENT NOTE ? ? ?Patient Name: Caleb [name] ?MRN: [id number] ?DOB:[birth year], 88 y.o., male ?Today's Date: 04/09/2022 ? ?PCP: Sharilyn Sites, MD ?REFERRING PROVIDER: Sharilyn Sites, MD ? ?END OF SESSION:  ? PT End of Session - 04/09/22 1141   ? ? Visit Number 3   ? Number of Visits 12   ? Date for PT Re-Evaluation 05/05/22   ? Authorization Type Primary: medicare Secondary AARP   ? Progress Note Due on Visit 10   ? PT Start Time 1140   ? PT Stop Time 4098   ? PT Time Calculation (min) 35 min   ? Activity Tolerance Patient tolerated treatment well   ? Behavior During Therapy [facility] for tasks assessed/performed   ? ?  ?  ? ?  ? ? ?Past Medical History:  ?Diagnosis Date  ? Coronary artery disease   ? Hyperlipidemia   ? Obesity   ? Penile cancer ([place])   ? Swelling of extremity   ? Left Leg  ? ?Past Surgical History:  ?Procedure Laterality Date  ? APPENDECTOMY    ? BACK SURGERY    ? BIOPSY  07/22/2021  ? Procedure: BIOPSY;  Surgeon: Jackquline Denmark, MD;  Location: [place];  Service: Gastroenterology;;  ? CHOLECYSTECTOMY    ? CORONARY ANGIOPLASTY  11/2005  ? RCA PCI AND STENTING WITH A CYPHER DES  ? CORONARY ARTERY BYPASS GRAFT    ? ESOPHAGOGASTRODUODENOSCOPY (EGD) WITH PROPOFOL N/A 07/22/2021  ? Procedure: ESOPHAGOGASTRODUODENOSCOPY (EGD) WITH PROPOFOL;  Surgeon: Jackquline Denmark, MD;  Location: [place];  Service: Gastroenterology;  Laterality: N/A;  ? HIATAL HERNIA REPAIR    ? lymph node removal    ? NM MYOCAR MULTIPLE W/SPECT  11/26/2009  ? EF 62%. NORMAL MYOCARDIAL PERFUSION STUDY.  ? TRANSTHORACIC ECHOCARDIOGRAM  12/22/2005  ? MILD TO MOD AORTIC SCLEROSIS W/O STENOSIS. MILD TO MODERATE MITRAL CALCIFICATION. LA- MILDLY DILATED.  ? ?Patient Active Problem List  ? Diagnosis Date Noted  ? Foley catheter problem ([place]) 02/02/2022  ? AKI (acute kidney injury) ([place]) 07/31/2021  ? Multiple duodenal ulcers 07/31/2021  ? Neurocognitive deficits 07/31/2021  ? Hematemesis 07/19/2021  ? Hyperkalemia  07/19/2021  ? Leukocytosis 07/19/2021  ? SIRS (systemic inflammatory response syndrome) ([place]) 07/19/2021  ? Acute blood loss anemia 07/19/2021  ? Transient hypotension 07/19/2021  ? GIB (gastrointestinal bleeding) 07/19/2021  ? Antiplatelet or antithrombotic long-term use   ? CKD (chronic kidney disease), stage III ([place]) 07/17/2021  ? Macrocytic anemia 07/17/2021  ? Atrial fibrillation ([place]) 07/17/2021  ? Pressure ulcers of skin of multiple topographic sites 07/17/2021  ? Rhabdomyolysis 07/11/2021  ? Fall at home, initial encounter 07/11/2021  ? Acute lower UTI 07/11/2021  ? Urinary retention 03/27/2020  ? Exudative age-related macular degeneration, right eye, with active choroidal neovascularization ([place]) 08/23/2017  ? BMI 32.0-32.9,adult 02/26/2016  ? Neurogenic bladder 02/26/2016  ? Scalp hematoma 01/13/2016  ? Self-catheterizes urinary bladder 01/13/2016  ? Pneumothorax, left 01/12/2016  ? Atypical chest pain 12/02/2015  ? Muscular deconditioning 02/20/2015  ? History of penile cancer 01/09/2015  ? Cellulitis 05/17/2014  ? Pain in limb- Slight pain- Left Leg 05/03/2014  ? Discoloration of skin-Left Leg 05/03/2014  ? Stasis edema with ulcer ([place]) 08/17/2013  ? Peripheral vascular disease, unspecified ([place]) 07/13/2013  ? Swelling of limb 07/13/2013  ? Coronary artery disease 06/07/2013  ? Left upper extremity numbness 06/07/2013  ? Hyperlipidemia   ? Knee pain 08/11/2011  ? ? ?REFERRING DIAG: Lesion on right lower ext  ? ?  THERAPY DIAG:  ?Skin lesion of right lower extremity ? ?Edema of right lower extremity ? ?PERTINENT HISTORY:  hx prostate cancer ?  ? ?PRECAUTIONS: falls  ? 04/09/2022 ?SUBJECTIVE: PT states that the bandage became uncomfortable and he had to take it off. ? ?PAIN:  ?Are you having pain? No ?Treatment:  Therapist cleansed and moisturized LE, debrided dry skin, followed by decongestive manual techniques to LE.  LE was then bandaged using 1/2 " foam and compression bandaging.   ? ?04/02/2022        TX:   Pt has no open wounds on LE but noted induration and paplliomas  ?                        Indicative of lymphedema.  ?Therapist cleansed and moisturized LE, debrided dry skin, cut 1/2" foam for LE bandaging. ?Therapist began decongestive manual techniques to decreased edema followed by compression  ?Bandaging using foam and profore lite.   ? ?LE LANDMARK RIGHT ?03/24/2022   04/09/2022  ?At groin     ?30 cm proximal to suprapatella     ?20 cm proximal to suprapatella     ?10 cm proximal to suprapatella     ?At [place] / popliteal crease     ?30 cm proximal to floor at lateral plantar foot 51 cm  44.4 cm  ?20 cm proximal to floor at lateral plantar foot 32 cm  36.8 cm  ?10 cm proximal to floor at lateral plantar foot 33 cm  26.3  ?Circumference of ankle/heel 33 cm  34  ?5 cm proximal to 1st MTP joint 26 cm  24.8  ?Across MTP joint 26 cm  25.8  ?Around proximal great toe     ?(Blank rows = not tested) ?  ?  ?TODAY'S TREATMENT Eval:  ?Edema massage to RLE with lotion 9 minutes ?Vaseline, profore lite wrap, netting ?  ?  ?PATIENT EDUCATION:  ?            3/6:  Lymphedema, how it is treated and its chronic nature.  ?Education details: Patient educated on exam findings, POC, scope of PT, HEP. ?Person educated: Patient ?Education method: Explanation and Demonstration ?Education comprehension: verbalized understanding and returned demonstration ?  ?  ?HOME EXERCISE PROGRAM: ?Ankle pumps ?  ?ASSESSMENT: ?  ?CLINICAL IMPRESSION:  Pt edema has decreased significantly but LE continues to have significant edema.  Mr. Trapani will continue to benefit from compression bandaging and manual techniques to decrease his edema.   ?  ?OBJECTIVE IMPAIRMENTS Abnormal gait, decreased activity tolerance, decreased balance, decreased endurance, decreased mobility, difficulty walking, decreased ROM, increased edema, impaired perceived functional ability, impaired flexibility, and improper body mechanics.  ?  ?ACTIVITY LIMITATIONS cleaning,  community activity, laundry, yard work, shopping, and yard work.  ?  ?PERSONAL FACTORS Age, Fitness, and Time since onset of injury/illness/exacerbation are also affecting patient's functional outcome. hx cancer, PVD, CAD, increased BMI, sedentary ?  ?  ?REHAB POTENTIAL: Fair   ?  ?CLINICAL DECISION MAKING: Evolving/moderate complexity ?  ?EVALUATION COMPLEXITY: Moderate ?  ?  ?  ?  ?  ?  ?  ?  ?  PT Education - 03/24/22 1300   ?  ?  Education Details Patient educated on exam findings, POC, scope of PT, ankle pumps   ?  Person(s) Educated Patient   ?  Methods Explanation;Demonstration   ?  Comprehension Verbalized understanding;Returned demonstration   ?  ?   ?  ?  ?   ?  ?  ?  PT Short Term Goals - 03/24/22 1408   ?  ?    ?     ?  PT SHORT TERM GOAL #1  ?  Title Patient will experience a 50% reduction in RLE edema for improved ability to dress.   ?  Time 3   ?  Period Weeks   ?  Status  On-going   ?  Target Date 04/14/22   ?  ?   ?  ?  ?   ?  ?  ?  ?  PT Long Term Goals - 03/24/22 1409   ?  ?    ?     ?  PT LONG TERM GOAL #1  ?  Title Patient will verbalize under standing of proper skin care and compression garment use.   ?  Time 6   ?  Period Weeks   ?  Status On-going   ?  Target Date 05/05/22   ?  ?   ?  ?  ?   ?  ?  ?  ?  ?  ?  ?  Plan - 03/24/22 1402   ?  ?  Clinical Impression Statement see above   ?  Personal Factors and Comorbidities Age;Comorbidity 3+;Fitness;Time since onset of injury/illness/exacerbation   ?  Comorbidities hx cancer, PVD, CAD, increased BMI, sedentary   ?  Examination-Activity Limitations Lift;Locomotion Level;Transfers;Stand;Stairs;Squat;Bend;Hygiene/Grooming   ?  Examination-Participation Restrictions Community Activity;Yard Work;Shop   ?  Stability/Clinical Decision Making Evolving/Moderate complexity   ?  Clinical Decision Making Moderate   ?  Rehab Potential Fair   ?  PT Frequency 2x / week   ?  PT Duration 6 weeks   ?  PT Treatment/Interventions ADLs/Self Care Home  Management;Taping;Scar mobilization;Compression bandaging;Manual lymph drainage;Manual techniques;Orthotic Fit/Training;Patient/family education;DME Instruction;Gait training;Stair training;Functional mobility training;

## 2022-04-14 ENCOUNTER — Ambulatory Visit (HOSPITAL_COMMUNITY): Payer: Medicare Other | Admitting: Physical Therapy

## 2022-04-14 ENCOUNTER — Encounter (HOSPITAL_COMMUNITY): Payer: Medicare Other | Admitting: Physical Therapy

## 2022-04-14 DIAGNOSIS — L989 Disorder of the skin and subcutaneous tissue, unspecified: Secondary | ICD-10-CM | POA: Diagnosis not present

## 2022-04-14 DIAGNOSIS — R600 Localized edema: Secondary | ICD-10-CM | POA: Diagnosis not present

## 2022-04-14 DIAGNOSIS — R2689 Other abnormalities of gait and mobility: Secondary | ICD-10-CM

## 2022-04-14 DIAGNOSIS — M2569 Stiffness of other specified joint, not elsewhere classified: Secondary | ICD-10-CM | POA: Diagnosis not present

## 2022-04-14 DIAGNOSIS — R531 Weakness: Secondary | ICD-10-CM | POA: Diagnosis not present

## 2022-04-14 DIAGNOSIS — Z9181 History of falling: Secondary | ICD-10-CM | POA: Diagnosis not present

## 2022-04-14 NOTE — Therapy (Signed)
?OUTPATIENT PHYSICAL THERAPY TREATMENT NOTE ? ? ?Patient Name: ANTWANN PREZIOSI ?MRN: [id number] ?DOB:10/28/1925, 88 y.o., male ?Today's Date: 04/14/2022 ? ?PCP: Sharilyn Sites, MD ?REFERRING PROVIDER: Sharilyn Sites, MD ? ?END OF SESSION:  ? PT End of Session - 04/14/22 1005   ? ? Visit Number 4   ? Number of Visits 12   ? Date for PT Re-Evaluation 05/05/22   ? Authorization Type Primary: medicare Secondary AARP   ? Progress Note Due on Visit 10   ? PT Start Time 1007   ? PT Stop Time 1046   ? PT Time Calculation (min) 39 min   ? Activity Tolerance Patient tolerated treatment well   ? Behavior During Therapy [facility] for tasks assessed/performed   ? ?  ?  ? ?  ? ? ?Past Medical History:  ?Diagnosis Date  ? Coronary artery disease   ? Hyperlipidemia   ? Obesity   ? Penile cancer ([place])   ? Swelling of extremity   ? Left Leg  ? ?Past Surgical History:  ?Procedure Laterality Date  ? APPENDECTOMY    ? BACK SURGERY    ? BIOPSY  07/22/2021  ? Procedure: BIOPSY;  Surgeon: Jackquline Denmark, MD;  Location: [place];  Service: Gastroenterology;;  ? CHOLECYSTECTOMY    ? CORONARY ANGIOPLASTY  11/2005  ? RCA PCI AND STENTING WITH A CYPHER DES  ? CORONARY ARTERY BYPASS GRAFT    ? ESOPHAGOGASTRODUODENOSCOPY (EGD) WITH PROPOFOL N/A 07/22/2021  ? Procedure: ESOPHAGOGASTRODUODENOSCOPY (EGD) WITH PROPOFOL;  Surgeon: Jackquline Denmark, MD;  Location: [place];  Service: Gastroenterology;  Laterality: N/A;  ? HIATAL HERNIA REPAIR    ? lymph node removal    ? NM MYOCAR MULTIPLE W/SPECT  11/26/2009  ? EF 62%. NORMAL MYOCARDIAL PERFUSION STUDY.  ? TRANSTHORACIC ECHOCARDIOGRAM  12/22/2005  ? MILD TO MOD AORTIC SCLEROSIS W/O STENOSIS. MILD TO MODERATE MITRAL CALCIFICATION. LA- MILDLY DILATED.  ? ?Patient Active Problem List  ? Diagnosis Date Noted  ? Foley catheter problem ([place]) 02/02/2022  ? AKI (acute kidney injury) ([place]) 07/31/2021  ? Multiple duodenal ulcers 07/31/2021  ? Neurocognitive deficits 07/31/2021  ? Hematemesis 07/19/2021  ? Hyperkalemia  07/19/2021  ? Leukocytosis 07/19/2021  ? SIRS (systemic inflammatory response syndrome) ([place]) 07/19/2021  ? Acute blood loss anemia 07/19/2021  ? Transient hypotension 07/19/2021  ? GIB (gastrointestinal bleeding) 07/19/2021  ? Antiplatelet or antithrombotic long-term use   ? CKD (chronic kidney disease), stage III ([place]) 07/17/2021  ? Macrocytic anemia 07/17/2021  ? Atrial fibrillation ([place]) 07/17/2021  ? Pressure ulcers of skin of multiple topographic sites 07/17/2021  ? Rhabdomyolysis 07/11/2021  ? Fall at home, initial encounter 07/11/2021  ? Acute lower UTI 07/11/2021  ? Urinary retention 03/27/2020  ? Exudative age-related macular degeneration, right eye, with active choroidal neovascularization ([place]) 08/23/2017  ? BMI 32.0-32.9,adult 02/26/2016  ? Neurogenic bladder 02/26/2016  ? Scalp hematoma 01/13/2016  ? Self-catheterizes urinary bladder 01/13/2016  ? Pneumothorax, left 01/12/2016  ? Atypical chest pain 12/02/2015  ? Muscular deconditioning 02/20/2015  ? History of penile cancer 01/09/2015  ? Cellulitis 05/17/2014  ? Pain in limb- Slight pain- Left Leg 05/03/2014  ? Discoloration of skin-Left Leg 05/03/2014  ? Stasis edema with ulcer ([place]) 08/17/2013  ? Peripheral vascular disease, unspecified ([place]) 07/13/2013  ? Swelling of limb 07/13/2013  ? Coronary artery disease 06/07/2013  ? Left upper extremity numbness 06/07/2013  ? Hyperlipidemia   ? Knee pain 08/11/2011  ? ? ?REFERRING DIAG: Lesion on right lower ext  ? ?  THERAPY DIAG:  ?Edema of right lower extremity ? ?Other abnormalities of gait and mobility ? ?PERTINENT HISTORY:  hx prostate cancer ?  ? ?PRECAUTIONS: falls  ? ?Treatment: ? ?04/14/2022 ?Subjective:  No pain, everything seems to be going well. ?Treatment:  Therapist cleansed and moisturized LE, debrided dry skin, followed by decongestive manual techniques to LE.  LE was then bandaged using 1/2 " foam and compression bandaging. ?Pt measurement close to Lt LE at this time and will be ready for  discharge next visit.  Lt Le measured for compression garment.  Pt given information for elastic therapy as he has obtained compression garments from there before  ? 04/09/2022 ?SUBJECTIVE: PT states that the bandage were comfortable.  He is exercising with therapy at another facility.  ? ?PAIN:  ?Are you having pain? No ?Treatment:  Therapist cleansed and moisturized LE, debrided dry skin, followed by decongestive manual techniques to LE.  LE was then bandaged using 1/2 " foam and compression bandaging.   ? ?04/02/2022        TX:  Pt has no open wounds on LE but noted induration and paplliomas  ?                        Indicative of lymphedema.  ?Therapist cleansed and moisturized LE, debrided dry skin, cut 1/2" foam for LE bandaging. ?Therapist began decongestive manual techniques to decreased edema followed by compression  ?Bandaging using foam and profore lite.   ? ?LE LANDMARK RIGHT ?03/24/2022   04/09/2022 04/14/2022  ?At groin      ?30 cm proximal to suprapatella      ?20 cm proximal to suprapatella      ?10 cm proximal to suprapatella      ?At [place] / popliteal crease      ?30 cm proximal to floor at lateral plantar foot 51 cm  44.4 cm 38.3     LT  35.4  ?20 cm proximal to floor at lateral plantar foot 32 cm  36.8 cm 31.7           29.5  ?10 cm proximal to floor at lateral plantar foot 33 cm  26.3 25.2           25.8  ?Circumference of ankle/heel 33 cm  34 33.3 ?  ?5 cm proximal to 1st MTP joint 26 cm  24.8 23  ?Across MTP joint 26 cm  25.8 25  ?Around proximal great toe      ?(Blank rows = not tested) ?  ?  ?TODAY'S TREATMENT Eval:  ?Edema massage to RLE with lotion 9 minutes ?Vaseline, profore lite wrap, netting ?  ?  ?PATIENT EDUCATION:  ?            3/6:  Lymphedema, how it is treated and its chronic nature.  ?Education details: Patient educated on exam findings, POC, scope of PT, HEP. ?Person educated: Patient ?Education method: Explanation and Demonstration ?Education comprehension: verbalized  understanding and returned demonstration ?  ?  ?HOME EXERCISE PROGRAM: ?Ankle pumps ?  ?ASSESSMENT: ?  ?CLINICAL IMPRESSION:  Pt edema continues to decreased and is without induration at this time.    Mr. Volkov will most likely be ready for discharge next session. ?  ?OBJECTIVE IMPAIRMENTS Abnormal gait, decreased activity tolerance, decreased balance, decreased endurance, decreased mobility, difficulty walking, decreased ROM, increased edema, impaired perceived functional ability, impaired flexibility, and improper body mechanics.  ?  ?ACTIVITY LIMITATIONS cleaning, community activity, laundry,  yard work, shopping, and yard work.  ?  ?PERSONAL FACTORS Age, Fitness, and Time since onset of injury/illness/exacerbation are also affecting patient's functional outcome. hx cancer, PVD, CAD, increased BMI, sedentary ?  ?  ?REHAB POTENTIAL: Fair   ?  ?CLINICAL DECISION MAKING: Evolving/moderate complexity ?  ?EVALUATION COMPLEXITY: Moderate ?  ?  ?  ?  ?  ?  ?  ?  ?  PT Education - 03/24/22 1300   ?  ?  Education Details Patient educated on exam findings, POC, scope of PT, ankle pumps   ?  Person(s) Educated Patient   ?  Methods Explanation;Demonstration   ?  Comprehension Verbalized understanding;Returned demonstration   ?  ?   ?  ?  ?   ?  ?  ?  PT Short Term Goals - 03/24/22 1408   ?  ?    ?     ?  PT SHORT TERM GOAL #1  ?  Title Patient will experience a 50% reduction in RLE edema for improved ability to dress.   ?  Time 3   ?  Period Weeks   ?  Status  met  ?  Target Date 04/14/22   ?  ?   ?  ?  ?   ?  ?  ?  ?  PT Long Term Goals - 03/24/22 1409   ?  ?    ?     ?  PT LONG TERM GOAL #1  ?  Title Patient will verbalize under standing of proper skin care and compression garment use.   ?  Time 6   ?  Period Weeks   ?  Status On-going   ?  Target Date 05/05/22   ?  ?   ?  ?  ?   ?  ?  ?  ?  ?  ?  ?  Plan - 03/24/22 1402   ?  ?  Clinical Impression Statement see above   ?  Personal Factors and Comorbidities Age;Comorbidity  3+;Fitness;Time since onset of injury/illness/exacerbation   ?  Comorbidities hx cancer, PVD, CAD, increased BMI, sedentary   ?  Examination-Activity Limitations Lift;Locomotion Level;Transfers;Stand;Stairs;Squat;

## 2022-04-16 ENCOUNTER — Encounter (HOSPITAL_COMMUNITY): Payer: Medicare Other | Admitting: Physical Therapy

## 2022-04-16 DIAGNOSIS — M2569 Stiffness of other specified joint, not elsewhere classified: Secondary | ICD-10-CM | POA: Diagnosis not present

## 2022-04-16 DIAGNOSIS — R2689 Other abnormalities of gait and mobility: Secondary | ICD-10-CM | POA: Diagnosis not present

## 2022-04-16 DIAGNOSIS — Z9181 History of falling: Secondary | ICD-10-CM | POA: Diagnosis not present

## 2022-04-16 DIAGNOSIS — R531 Weakness: Secondary | ICD-10-CM | POA: Diagnosis not present

## 2022-04-17 ENCOUNTER — Ambulatory Visit (HOSPITAL_COMMUNITY): Payer: Medicare Other | Admitting: Physical Therapy

## 2022-04-17 DIAGNOSIS — R2689 Other abnormalities of gait and mobility: Secondary | ICD-10-CM | POA: Diagnosis not present

## 2022-04-17 DIAGNOSIS — L989 Disorder of the skin and subcutaneous tissue, unspecified: Secondary | ICD-10-CM | POA: Diagnosis not present

## 2022-04-17 DIAGNOSIS — R600 Localized edema: Secondary | ICD-10-CM | POA: Diagnosis not present

## 2022-04-17 NOTE — Therapy (Addendum)
?OUTPATIENT PHYSICAL THERAPY TREATMENT NOTE ? ? ?Patient Name: Caleb [name] ?MRN: [id number] ?DOB:Jun 30, 1925, 88 y.o., male ?Today's Date: 04/17/2022 ? ?PCP: Sharilyn Sites, MD ?REFERRING PROVIDER: Sharilyn Sites, MD ?PHYSICAL THERAPY DISCHARGE SUMMARY ? ?Visits from Start of Care: 5 ? ?Current functional level related to goals / functional outcomes: ?met ?  ?Remaining deficits: ?None  ?  ?Education / Equipment: ?As below   ? ?Patient agrees to discharge. Patient goals were met. Patient is being discharged due to meeting the stated rehab goals.  ?END OF SESSION:  ? PT End of Session - 04/17/22 0933   ? ? Visit Number 5   ? Number of Visits 5   ? Authorization Type Primary: medicare Secondary AARP   ? PT Start Time 4166   ? PT Stop Time 1000   ? PT Time Calculation (min) 40 min   ? ?  ?  ? ?  ? ? ?Past Medical History:  ?Diagnosis Date  ? Coronary artery disease   ? Hyperlipidemia   ? Obesity   ? Penile cancer ([place])   ? Swelling of extremity   ? Left Leg  ? ?Past Surgical History:  ?Procedure Laterality Date  ? APPENDECTOMY    ? BACK SURGERY    ? BIOPSY  07/22/2021  ? Procedure: BIOPSY;  Surgeon: Jackquline Denmark, MD;  Location: [place];  Service: Gastroenterology;;  ? CHOLECYSTECTOMY    ? CORONARY ANGIOPLASTY  11/2005  ? RCA PCI AND STENTING WITH A CYPHER DES  ? CORONARY ARTERY BYPASS GRAFT    ? ESOPHAGOGASTRODUODENOSCOPY (EGD) WITH PROPOFOL N/A 07/22/2021  ? Procedure: ESOPHAGOGASTRODUODENOSCOPY (EGD) WITH PROPOFOL;  Surgeon: Jackquline Denmark, MD;  Location: [place];  Service: Gastroenterology;  Laterality: N/A;  ? HIATAL HERNIA REPAIR    ? lymph node removal    ? NM MYOCAR MULTIPLE W/SPECT  11/26/2009  ? EF 62%. NORMAL MYOCARDIAL PERFUSION STUDY.  ? TRANSTHORACIC ECHOCARDIOGRAM  12/22/2005  ? MILD TO MOD AORTIC SCLEROSIS W/O STENOSIS. MILD TO MODERATE MITRAL CALCIFICATION. LA- MILDLY DILATED.  ? ?Patient Active Problem List  ? Diagnosis Date Noted  ? Foley catheter problem ([place]) 02/02/2022  ? AKI (acute kidney  injury) ([place]) 07/31/2021  ? Multiple duodenal ulcers 07/31/2021  ? Neurocognitive deficits 07/31/2021  ? Hematemesis 07/19/2021  ? Hyperkalemia 07/19/2021  ? Leukocytosis 07/19/2021  ? SIRS (systemic inflammatory response syndrome) ([place]) 07/19/2021  ? Acute blood loss anemia 07/19/2021  ? Transient hypotension 07/19/2021  ? GIB (gastrointestinal bleeding) 07/19/2021  ? Antiplatelet or antithrombotic long-term use   ? CKD (chronic kidney disease), stage III ([place]) 07/17/2021  ? Macrocytic anemia 07/17/2021  ? Atrial fibrillation ([place]) 07/17/2021  ? Pressure ulcers of skin of multiple topographic sites 07/17/2021  ? Rhabdomyolysis 07/11/2021  ? Fall at home, initial encounter 07/11/2021  ? Acute lower UTI 07/11/2021  ? Urinary retention 03/27/2020  ? Exudative age-related macular degeneration, right eye, with active choroidal neovascularization ([place]) 08/23/2017  ? BMI 32.0-32.9,adult 02/26/2016  ? Neurogenic bladder 02/26/2016  ? Scalp hematoma 01/13/2016  ? Self-catheterizes urinary bladder 01/13/2016  ? Pneumothorax, left 01/12/2016  ? Atypical chest pain 12/02/2015  ? Muscular deconditioning 02/20/2015  ? History of penile cancer 01/09/2015  ? Cellulitis 05/17/2014  ? Pain in limb- Slight pain- Left Leg 05/03/2014  ? Discoloration of skin-Left Leg 05/03/2014  ? Stasis edema with ulcer ([place]) 08/17/2013  ? Peripheral vascular disease, unspecified ([place]) 07/13/2013  ? Swelling of limb 07/13/2013  ? Coronary artery disease 06/07/2013  ? Left upper extremity  numbness 06/07/2013  ? Hyperlipidemia   ? Knee pain 08/11/2011  ? ? ?REFERRING DIAG: Lesion on right lower ext  ? ?THERAPY DIAG:  ?Edema of right lower extremity ? ?PERTINENT HISTORY:  hx prostate cancer ?  ? ?PRECAUTIONS: falls  ? ?Treatment: ? ?04/16/2022:  Subjective:  PT comes to department stating that his garments have came in.  States that he is having some increased pain in his leg today.  Aide states that he was worked more than  ?                                         normal at his last Therapy session and she feels that pt is sore. ?                   Objective:     Profore removed with noted dry skin.  LE cleansed but not moisturized as therapist is demonstrating donning of compression sock.  PT has received his compression pump.   ?                                        Therapist explained to don socks on in AM, take off, cleanse and moisturize LE then pump.  In the Am repeat above.  Aide and pt verbalized understanding.  PT Rt LE measured  ?                                        Edema is stable and pt is ready for discharge.  ?04/14/2022 ?Subjective:  No pain, everything seems to be going well. ?Treatment:  Therapist cleansed and moisturized LE, debrided dry skin, followed by decongestive manual techniques to LE.  LE was then bandaged using 1/2 " foam and compression bandaging. ?Pt measurement close to Lt LE at this time and will be ready for discharge next visit.  Lt Le measured for compression garment.  Pt given information for elastic therapy as he has obtained compression garments from there before  ? 04/09/2022 ?SUBJECTIVE: PT states that the bandage were comfortable.  He is exercising with therapy at another facility.  ? ?PAIN:  ?Are you having pain? No ?Treatment:  Therapist cleansed and moisturized LE, debrided dry skin, followed by decongestive manual techniques to LE.  LE was then bandaged using 1/2 " foam and compression bandaging.   ? ?04/02/2022        TX:  Pt has no open wounds on LE but noted induration and paplliomas  ?                        Indicative of lymphedema.  ?Therapist cleansed and moisturized LE, debrided dry skin, cut 1/2" foam for LE bandaging. ?Therapist began decongestive manual techniques to decreased edema followed by compression  ?Bandaging using foam and profore lite.   ? ?LE LANDMARK RIGHT ?03/24/2022   04/09/2022 04/14/2022 04/17/2022  ?At groin       ?30 cm proximal to suprapatella       ?20 cm proximal to suprapatella       ?10 cm  proximal to suprapatella       ?At [place] / popliteal crease       ?  30 cm proximal to floor at lateral plantar foot 51 cm  44.4 cm 38.3     LT  35.4 RT 39  ?20 cm proximal to floor at lateral plantar foot 32 cm  36.8 cm 31.7           29.5      29.8  ?10 cm proximal to floor at lateral plantar foot 33 cm  26.3 25.2           25.8      24.8  ?Circumference of ankle/heel 33 cm  34 33.3 ?      33.8  ?5 cm proximal to 1st MTP joint 26 cm  24.8 23      22.8  ?Across MTP joint 26 cm  25.8 25      25.2 ?  ?Around proximal great toe       ?(Blank rows = not tested) ?  ?  ?TODAY'S TREATMENT Eval:  ?Edema massage to RLE with lotion 9 minutes ?Vaseline, profore lite wrap, netting ?  ?  ?PATIENT EDUCATION: 4/21: please see note  ?            3/6:  Lymphedema, how it is treated and its chronic nature.  ?Education details: Patient educated on exam findings, POC, scope of PT, HEP. ?Person educated: Patient ?Education method: Explanation and Demonstration ?Education comprehension: verbalized understanding and returned demonstration ?  ?  ?HOME EXERCISE PROGRAM: ?Ankle pumps ?  ?ASSESSMENT: ?  ?CLINICAL IMPRESSION:  Pt edema has stabilized.  Pt has compression socks, and pump.  He is ready for discharge.  ?  ?OBJECTIVE IMPAIRMENTS Abnormal gait, decreased activity tolerance, decreased balance, decreased endurance, decreased mobility, difficulty walking, decreased ROM, increased edema, impaired perceived functional ability, impaired flexibility, and improper body mechanics.  ?  ?ACTIVITY LIMITATIONS cleaning, community activity, laundry, yard work, shopping, and yard work.  ?  ?PERSONAL FACTORS Age, Fitness, and Time since onset of injury/illness/exacerbation are also affecting patient's functional outcome. hx cancer, PVD, CAD, increased BMI, sedentary ?  ?  ?REHAB POTENTIAL: Fair   ?  ?CLINICAL DECISION MAKING: Evolving/moderate complexity ?  ?EVALUATION COMPLEXITY: Moderate ?  ?  ?  ?  ?  ?  ?  ?  ?  PT Education - 03/24/22  1300   ?  ?  Education Details Patient educated on exam findings, POC, scope of PT, ankle pumps   ?  Person(s) Educated Patient   ?  Methods Explanation;Demonstration   ?  Comprehension Verbalized understan

## 2022-04-21 ENCOUNTER — Encounter (HOSPITAL_COMMUNITY): Payer: Medicare Other | Admitting: Physical Therapy

## 2022-04-22 ENCOUNTER — Ambulatory Visit (INDEPENDENT_AMBULATORY_CARE_PROVIDER_SITE_OTHER): Payer: Medicare Other | Admitting: Physician Assistant

## 2022-04-22 DIAGNOSIS — Z466 Encounter for fitting and adjustment of urinary device: Secondary | ICD-10-CM

## 2022-04-22 DIAGNOSIS — R339 Retention of urine, unspecified: Secondary | ICD-10-CM | POA: Diagnosis not present

## 2022-04-22 NOTE — Progress Notes (Signed)
Cath Change/ Replacement ? ?Patient is present today for a catheter change due to urinary retention.  83m of water was removed from the balloon, a 22FR foley cath was removed with out difficulty.  Patient was cleaned and prepped in a sterile fashion with betadine. A 22 FR foley cath was replaced into the bladder no complications were noted Urine return was noted 223mand urine was yellow in color. The balloon was filled with 1017mf sterile water. A leg bag was attached for drainage.  A night bag was also given to the patient and patient was given instruction on how to change from one bag to another. Patient was given proper instruction on catheter care.   ? ?Performed by: KouLevi AlandMA ? ?Follow up: Follow up as scheduled.   ?

## 2022-04-23 ENCOUNTER — Encounter (HOSPITAL_COMMUNITY): Payer: Medicare Other | Admitting: Physical Therapy

## 2022-04-23 DIAGNOSIS — R531 Weakness: Secondary | ICD-10-CM | POA: Diagnosis not present

## 2022-04-23 DIAGNOSIS — R2689 Other abnormalities of gait and mobility: Secondary | ICD-10-CM | POA: Diagnosis not present

## 2022-04-23 DIAGNOSIS — M2569 Stiffness of other specified joint, not elsewhere classified: Secondary | ICD-10-CM | POA: Diagnosis not present

## 2022-04-23 DIAGNOSIS — Z9181 History of falling: Secondary | ICD-10-CM | POA: Diagnosis not present

## 2022-04-28 ENCOUNTER — Encounter (HOSPITAL_COMMUNITY): Payer: Medicare Other | Admitting: Physical Therapy

## 2022-04-28 DIAGNOSIS — R531 Weakness: Secondary | ICD-10-CM | POA: Diagnosis not present

## 2022-04-28 DIAGNOSIS — Z9181 History of falling: Secondary | ICD-10-CM | POA: Diagnosis not present

## 2022-04-28 DIAGNOSIS — R2689 Other abnormalities of gait and mobility: Secondary | ICD-10-CM | POA: Diagnosis not present

## 2022-04-28 DIAGNOSIS — M2569 Stiffness of other specified joint, not elsewhere classified: Secondary | ICD-10-CM | POA: Diagnosis not present

## 2022-04-30 ENCOUNTER — Encounter (HOSPITAL_COMMUNITY): Payer: Medicare Other | Admitting: Physical Therapy

## 2022-04-30 DIAGNOSIS — R2689 Other abnormalities of gait and mobility: Secondary | ICD-10-CM | POA: Diagnosis not present

## 2022-04-30 DIAGNOSIS — M2569 Stiffness of other specified joint, not elsewhere classified: Secondary | ICD-10-CM | POA: Diagnosis not present

## 2022-04-30 DIAGNOSIS — R531 Weakness: Secondary | ICD-10-CM | POA: Diagnosis not present

## 2022-04-30 DIAGNOSIS — Z9181 History of falling: Secondary | ICD-10-CM | POA: Diagnosis not present

## 2022-05-04 ENCOUNTER — Encounter (HOSPITAL_COMMUNITY): Payer: Medicare Other | Admitting: Physical Therapy

## 2022-05-05 DIAGNOSIS — Z9181 History of falling: Secondary | ICD-10-CM | POA: Diagnosis not present

## 2022-05-05 DIAGNOSIS — M2569 Stiffness of other specified joint, not elsewhere classified: Secondary | ICD-10-CM | POA: Diagnosis not present

## 2022-05-05 DIAGNOSIS — R2689 Other abnormalities of gait and mobility: Secondary | ICD-10-CM | POA: Diagnosis not present

## 2022-05-05 DIAGNOSIS — R531 Weakness: Secondary | ICD-10-CM | POA: Diagnosis not present

## 2022-05-06 ENCOUNTER — Encounter (HOSPITAL_COMMUNITY): Payer: Medicare Other

## 2022-05-07 DIAGNOSIS — R2689 Other abnormalities of gait and mobility: Secondary | ICD-10-CM | POA: Diagnosis not present

## 2022-05-07 DIAGNOSIS — M2569 Stiffness of other specified joint, not elsewhere classified: Secondary | ICD-10-CM | POA: Diagnosis not present

## 2022-05-07 DIAGNOSIS — Z9181 History of falling: Secondary | ICD-10-CM | POA: Diagnosis not present

## 2022-05-07 DIAGNOSIS — R531 Weakness: Secondary | ICD-10-CM | POA: Diagnosis not present

## 2022-05-08 ENCOUNTER — Encounter (HOSPITAL_COMMUNITY): Payer: Medicare Other | Admitting: Physical Therapy

## 2022-05-11 ENCOUNTER — Encounter (HOSPITAL_COMMUNITY): Payer: Medicare Other | Admitting: Physical Therapy

## 2022-05-11 DIAGNOSIS — Z6834 Body mass index (BMI) 34.0-34.9, adult: Secondary | ICD-10-CM | POA: Diagnosis not present

## 2022-05-11 DIAGNOSIS — H6123 Impacted cerumen, bilateral: Secondary | ICD-10-CM | POA: Diagnosis not present

## 2022-05-11 DIAGNOSIS — E6609 Other obesity due to excess calories: Secondary | ICD-10-CM | POA: Diagnosis not present

## 2022-05-12 DIAGNOSIS — M2569 Stiffness of other specified joint, not elsewhere classified: Secondary | ICD-10-CM | POA: Diagnosis not present

## 2022-05-12 DIAGNOSIS — Z9181 History of falling: Secondary | ICD-10-CM | POA: Diagnosis not present

## 2022-05-12 DIAGNOSIS — R531 Weakness: Secondary | ICD-10-CM | POA: Diagnosis not present

## 2022-05-12 DIAGNOSIS — R2689 Other abnormalities of gait and mobility: Secondary | ICD-10-CM | POA: Diagnosis not present

## 2022-05-13 ENCOUNTER — Encounter (HOSPITAL_COMMUNITY): Payer: Medicare Other | Admitting: Physical Therapy

## 2022-05-19 ENCOUNTER — Encounter (HOSPITAL_COMMUNITY): Payer: Medicare Other | Admitting: Physical Therapy

## 2022-05-19 DIAGNOSIS — R2689 Other abnormalities of gait and mobility: Secondary | ICD-10-CM | POA: Diagnosis not present

## 2022-05-19 DIAGNOSIS — R531 Weakness: Secondary | ICD-10-CM | POA: Diagnosis not present

## 2022-05-19 DIAGNOSIS — W57XXXA Bitten or stung by nonvenomous insect and other nonvenomous arthropods, initial encounter: Secondary | ICD-10-CM | POA: Diagnosis not present

## 2022-05-19 DIAGNOSIS — Z6833 Body mass index (BMI) 33.0-33.9, adult: Secondary | ICD-10-CM | POA: Diagnosis not present

## 2022-05-19 DIAGNOSIS — M2569 Stiffness of other specified joint, not elsewhere classified: Secondary | ICD-10-CM | POA: Diagnosis not present

## 2022-05-19 DIAGNOSIS — E6609 Other obesity due to excess calories: Secondary | ICD-10-CM | POA: Diagnosis not present

## 2022-05-19 DIAGNOSIS — Z9181 History of falling: Secondary | ICD-10-CM | POA: Diagnosis not present

## 2022-05-21 ENCOUNTER — Ambulatory Visit (INDEPENDENT_AMBULATORY_CARE_PROVIDER_SITE_OTHER): Payer: Medicare Other | Admitting: Physician Assistant

## 2022-05-21 ENCOUNTER — Encounter (HOSPITAL_COMMUNITY): Payer: Medicare Other | Admitting: Physical Therapy

## 2022-05-21 DIAGNOSIS — R531 Weakness: Secondary | ICD-10-CM | POA: Diagnosis not present

## 2022-05-21 DIAGNOSIS — Z9181 History of falling: Secondary | ICD-10-CM | POA: Diagnosis not present

## 2022-05-21 DIAGNOSIS — R339 Retention of urine, unspecified: Secondary | ICD-10-CM

## 2022-05-21 DIAGNOSIS — R2689 Other abnormalities of gait and mobility: Secondary | ICD-10-CM | POA: Diagnosis not present

## 2022-05-21 DIAGNOSIS — M2569 Stiffness of other specified joint, not elsewhere classified: Secondary | ICD-10-CM | POA: Diagnosis not present

## 2022-05-21 NOTE — Progress Notes (Signed)
Cath Change/ Replacement  Patient is present today for a catheter change due to urinary retention.  33m of water was removed from the balloon, a 22FR foley cath was removed with out difficulty.  Patient was cleaned and prepped in a sterile fashion with betadine. A 22 FR foley cath was replaced into the bladder no complications were noted Urine return was noted 143mand urine was yellow in color. The balloon was filled with 1047mf sterile water. A leg bag was attached for drainage.  A night bag was also given to the patient and patient was given instruction on how to change from one bag to another. Patient was given proper instruction on catheter care.    Performed by: KouLevi AlandMA  Follow up: Follow up in 1 month

## 2022-05-25 ENCOUNTER — Other Ambulatory Visit: Payer: Self-pay

## 2022-05-25 ENCOUNTER — Encounter (HOSPITAL_COMMUNITY): Payer: Self-pay

## 2022-05-25 ENCOUNTER — Emergency Department (HOSPITAL_COMMUNITY)
Admission: EM | Admit: 2022-05-25 | Discharge: 2022-05-25 | Disposition: A | Discharge status: Home / Self Care | Payer: Medicare Other | Attending: Emergency Medicine | Admitting: Emergency Medicine

## 2022-05-25 ENCOUNTER — Emergency Department (HOSPITAL_COMMUNITY): Payer: Medicare Other

## 2022-05-25 DIAGNOSIS — R062 Wheezing: Secondary | ICD-10-CM | POA: Insufficient documentation

## 2022-05-25 DIAGNOSIS — Z7982 Long term (current) use of aspirin: Secondary | ICD-10-CM | POA: Insufficient documentation

## 2022-05-25 DIAGNOSIS — R059 Cough, unspecified: Secondary | ICD-10-CM | POA: Diagnosis present

## 2022-05-25 DIAGNOSIS — Z20822 Contact with and (suspected) exposure to covid-19: Secondary | ICD-10-CM | POA: Insufficient documentation

## 2022-05-25 DIAGNOSIS — Z8549 Personal history of malignant neoplasm of other male genital organs: Secondary | ICD-10-CM | POA: Diagnosis not present

## 2022-05-25 DIAGNOSIS — B9789 Other viral agents as the cause of diseases classified elsewhere: Secondary | ICD-10-CM | POA: Diagnosis not present

## 2022-05-25 DIAGNOSIS — I2510 Atherosclerotic heart disease of native coronary artery without angina pectoris: Secondary | ICD-10-CM | POA: Insufficient documentation

## 2022-05-25 DIAGNOSIS — J069 Acute upper respiratory infection, unspecified: Secondary | ICD-10-CM | POA: Insufficient documentation

## 2022-05-25 DIAGNOSIS — J029 Acute pharyngitis, unspecified: Secondary | ICD-10-CM | POA: Diagnosis not present

## 2022-05-25 DIAGNOSIS — R9431 Abnormal electrocardiogram [ECG] [EKG]: Secondary | ICD-10-CM | POA: Diagnosis not present

## 2022-05-25 LAB — BASIC METABOLIC PANEL
Anion gap: 4 — ABNORMAL LOW (ref 5–15)
BUN: 28 mg/dL — ABNORMAL HIGH (ref 8–23)
CO2: 26 mmol/L (ref 22–32)
Calcium: 9.4 mg/dL (ref 8.9–10.3)
Chloride: 110 mmol/L (ref 98–111)
Creatinine, Ser: 1.46 mg/dL — ABNORMAL HIGH (ref 0.61–1.24)
GFR, Estimated: 44 mL/min — ABNORMAL LOW (ref >60)
Glucose, Bld: 82 mg/dL (ref 70–99)
Potassium: 4.3 mmol/L (ref 3.5–5.1)
Sodium: 140 mmol/L (ref 135–145)

## 2022-05-25 LAB — CBC WITH DIFFERENTIAL/PLATELET
Abs Immature Granulocytes: 0.03 10*3/uL (ref 0.00–0.07)
Basophils Absolute: 0 10*3/uL (ref 0.0–0.1)
Basophils Relative: 0 %
Eosinophils Absolute: 0 10*3/uL (ref 0.0–0.5)
Eosinophils Relative: 0 %
HCT: 35.9 % — ABNORMAL LOW (ref 39.0–52.0)
Hemoglobin: 11.5 g/dL — ABNORMAL LOW (ref 13.0–17.0)
Immature Granulocytes: 0 %
Lymphocytes Relative: 9 %
Lymphs Abs: 0.8 10*3/uL (ref 0.7–4.0)
MCH: 31.8 pg (ref 26.0–34.0)
MCHC: 32 g/dL (ref 30.0–36.0)
MCV: 99.2 fL (ref 80.0–100.0)
Monocytes Absolute: 0.8 10*3/uL (ref 0.1–1.0)
Monocytes Relative: 9 %
Neutro Abs: 8 10*3/uL — ABNORMAL HIGH (ref 1.7–7.7)
Neutrophils Relative %: 82 %
Platelets: 155 10*3/uL (ref 150–400)
RBC: 3.62 MIL/uL — ABNORMAL LOW (ref 4.22–5.81)
RDW: 16.3 % — ABNORMAL HIGH (ref 11.5–15.5)
WBC: 9.8 10*3/uL (ref 4.0–10.5)
nRBC: 0 % (ref 0.0–0.2)

## 2022-05-25 LAB — RESP PANEL BY RT-PCR (FLU A&B, COVID) ARPGX2
Influenza A by PCR: NEGATIVE
Influenza B by PCR: NEGATIVE
SARS Coronavirus 2 by RT PCR: NEGATIVE

## 2022-05-25 LAB — GROUP A STREP BY PCR: Group A Strep by PCR: NOT DETECTED

## 2022-05-25 MED ORDER — BENZONATATE 100 MG PO CAPS
100.0000 mg | ORAL_CAPSULE | Freq: Once | ORAL | Status: AC
  Administered 2022-05-25: 100 mg via ORAL
  Filled 2022-05-25: qty 1

## 2022-05-25 MED ORDER — ACETAMINOPHEN 500 MG PO TABS
1000.0000 mg | ORAL_TABLET | Freq: Once | ORAL | Status: AC
  Administered 2022-05-25: 1000 mg via ORAL
  Filled 2022-05-25: qty 2

## 2022-05-25 MED ORDER — ALBUTEROL SULFATE HFA 108 (90 BASE) MCG/ACT IN AERS
2.0000 | INHALATION_SPRAY | Freq: Once | RESPIRATORY_TRACT | Status: AC
  Administered 2022-05-25: 2 via RESPIRATORY_TRACT
  Filled 2022-05-25: qty 6.7

## 2022-05-25 MED ORDER — DOXYCYCLINE HYCLATE 100 MG PO CAPS: 100.0000 mg | ORAL_CAPSULE | Freq: Two times a day (BID) | ORAL | 0 refills | Status: DC

## 2022-05-25 MED ORDER — BENZONATATE 100 MG PO CAPS: 100.0000 mg | ORAL_CAPSULE | Freq: Three times a day (TID) | ORAL | 0 refills | Status: DC | PRN

## 2022-05-25 MED ORDER — IPRATROPIUM-ALBUTEROL 0.5-2.5 (3) MG/3ML IN SOLN
3.0000 mL | Freq: Once | RESPIRATORY_TRACT | Status: AC
  Administered 2022-05-25: 3 mL via RESPIRATORY_TRACT
  Filled 2022-05-25: qty 3

## 2022-05-25 NOTE — ED Provider Notes (Signed)
[facility] Provider Note   CSN: [id number] Arrival date & time: 05/25/22  5638     History  Chief Complaint  Patient presents with   Cough    Caleb [name] is a 88 y.o. male.  Caleb [name] is a 88 y.o. male with a history of CAD, hyperlipidemia, penile cancer, and obesity, who presents to the emergency department for evaluation of cough, congestion and sore throat.  Symptoms have been present for the last 3 days.  Cough is primarily dry and patient reports he has been using a lot of cough drops without much improvement.  His caregiver is at bedside and reports he used several cough drops throughout the night.  This morning have an episode where he coughed so hard that he had an episode of diarrhea on himself.  He denies any chest pain or shortness of breath.  No fevers or chills.  No known sick contacts.   The history is provided by the patient and a caregiver.  Cough Associated symptoms: rhinorrhea and sore throat   Associated symptoms: no chest pain, no chills, no ear pain, no fever, no myalgias and no shortness of breath       Home Medications Prior to Admission medications   Medication Sig Start Date End Date Taking? Authorizing Provider  acetaminophen (TYLENOL) 325 MG tablet Take 2 tablets (650 mg total) by mouth every 6 (six) hours as needed for mild pain (or Fever >/= 101). 07/28/21  Yes Elgergawy, Silver Huguenin, MD  aspirin EC 81 MG tablet Take 1 tablet (81 mg total) by mouth daily. 09/23/21  Yes Medina-Vargas, Monina C, NP  benzonatate (TESSALON) 100 MG capsule Take 1 capsule (100 mg total) by mouth 3 (three) times daily as needed for cough. 05/25/22  Yes Jacqlyn Larsen, PA-C  Calcium Carbonate-Vitamin D (CALCIUM-D PO) Take 1 tablet by mouth daily.   Yes [provider]  doxycycline (VIBRAMYCIN) 100 MG capsule Take 1 capsule (100 mg total) by mouth 2 (two) times daily. One po bid x 7 days 05/25/22  Yes Jacqlyn Larsen, PA-C  furosemide (LASIX) 40 MG  tablet Take 40 mg by mouth daily. 03/17/22  Yes [provider]  metoprolol tartrate (LOPRESSOR) 25 MG tablet Take 0.5 tablets (12.5 mg total) by mouth 2 (two) times daily. 09/23/21  Yes Medina-Vargas, Monina C, NP  Multiple Vitamin (MULTIVITAMIN WITH MINERALS) TABS tablet Take 1 tablet by mouth daily. Centrum Silver   Yes [provider]  Multiple Vitamins-Minerals (PRESERVISION AREDS 2) CAPS Take by mouth.   Yes [provider]  pantoprazole (PROTONIX) 40 MG tablet Take 1 tablet (40 mg total) by mouth daily. Give one tablet by mouth twice daily for Upper GI Bleed x 8wks 09/23/21  Yes Medina-Vargas, Monina C, NP  simvastatin (ZOCOR) 20 MG tablet Take 1 tablet (20 mg total) by mouth at bedtime. 09/23/21  Yes Medina-Vargas, Monina C, NP  lactulose (CHRONULAC) 10 GM/15ML solution Take 15 mLs (10 g total) by mouth 2 (two) times daily as needed for mild constipation. Patient not taking: Reported on 05/25/2022 09/23/21   Medina-Vargas, Monina C, NP  polyethylene glycol powder (GLYCOLAX/MIRALAX) 17 GM/SCOOP powder Take 17 g by mouth daily. Patient not taking: Reported on 05/25/2022 09/23/21   Medina-Vargas, Senaida Lange, NP      Allergies    Patient has no known allergies.    Review of Systems   Review of Systems  Constitutional:  Negative for chills and fever.  HENT:  Positive for congestion, rhinorrhea and sore throat. Negative for ear pain.   Respiratory:  Positive for cough. Negative for shortness of breath.   Cardiovascular:  Negative for chest pain.  Gastrointestinal:  Positive for diarrhea. Negative for abdominal pain, blood in stool, nausea and vomiting.  Genitourinary:  Negative for dysuria.  Musculoskeletal:  Negative for myalgias and neck pain.  All other systems reviewed and are negative.  Physical Exam Updated Vital Signs BP 114/61   Pulse 85   Temp 98.4 F (36.9 C)   Resp 20   Ht '5\' 10"'$  (1.778 m)   Wt 114.3 kg   SpO2 95%   BMI 36.16 kg/m  Physical  Exam Vitals and nursing note reviewed.  Constitutional:      General: He is not in acute distress.    Appearance: He is well-developed. He is not ill-appearing or diaphoretic.  HENT:     Head: Normocephalic and atraumatic.     Right Ear: Tympanic membrane and ear canal normal.     Left Ear: Tympanic membrane and ear canal normal.     Nose: Congestion and rhinorrhea present.     Mouth/Throat:     Mouth: Mucous membranes are moist.     Pharynx: Oropharynx is clear.     Comments: Posterior oropharynx clear and mucous membranes moist, there is mild erythema but no edema or tonsillar exudates, uvula midline, normal phonation, no trismus, tolerating secretions without difficulty. Eyes:     General:        Right eye: No discharge.        Left eye: No discharge.  Neck:     Comments: No rigidity Cardiovascular:     Rate and Rhythm: Normal rate and regular rhythm.     Heart sounds: Normal heart sounds. No murmur heard.   No friction rub. No gallop.  Pulmonary:     Effort: Pulmonary effort is normal. No respiratory distress.     Breath sounds: Wheezing present.     Comments: Respirations equal and unlabored, patient able to speak in full sentences, satting well on room air, patient does have some faint scattered expiratory wheezes in bilateral lung bases, no rales or rhonchi Abdominal:     General: Bowel sounds are normal. There is no distension.     Palpations: Abdomen is soft. There is no mass.     Tenderness: There is no abdominal tenderness. There is no guarding.     Comments: Abdomen soft, nondistended, nontender to palpation in all quadrants without guarding or peritoneal signs  Musculoskeletal:        General: No deformity.     Cervical back: Neck supple.  Lymphadenopathy:     Cervical: No cervical adenopathy.  Skin:    General: Skin is warm and dry.     Capillary Refill: Capillary refill takes less than 2 seconds.  Neurological:     Mental Status: He is alert and oriented to  person, place, and time.  Psychiatric:        Mood and Affect: Mood normal.        Behavior: Behavior normal.    ED Results / Procedures / Treatments   Labs (all labs ordered are listed, but only abnormal results are displayed) Labs Reviewed  BASIC METABOLIC PANEL - Abnormal; Notable for the following components:      Result Value   BUN 28 (*)    Creatinine, Ser 1.46 (*)    GFR, Estimated 44 (*)    Anion gap 4 (*)  All other components within normal limits  CBC WITH DIFFERENTIAL/PLATELET - Abnormal; Notable for the following components:   RBC 3.62 (*)    Hemoglobin 11.5 (*)    HCT 35.9 (*)    RDW 16.3 (*)    Neutro Abs 8.0 (*)    All other components within normal limits  GROUP A STREP BY PCR  RESP PANEL BY RT-PCR (FLU A&B, COVID) [id number]    EKG EKG Interpretation  Date/Time:  Monday May 25 2022 11:01:10 EDT Ventricular Rate:  82 PR Interval:  57 QRS Duration: 114 QT Interval:  388 QTC Calculation: 656 R Axis:   -7 Text Interpretation: Sinus rhythm Atrial premature complex Short PR interval Borderline intraventricular conduction delay Borderline low voltage, extremity leads Confirmed by Noemi Chapel [id number]) on 05/25/2022 11:06:16 AM  Radiology DG Chest Port 1 View  Result Date: 05/25/2022 CLINICAL DATA:  88 year old male with history of diarrhea, cough and congestion. Sore throat. EXAM: PORTABLE CHEST 1 VIEW COMPARISON:  Chest x-ray 07/19/2021. FINDINGS: Lung volumes are low. No consolidative airspace disease. No pleural effusions. No pneumothorax. No pulmonary nodule or mass noted. Pulmonary vasculature and the cardiomediastinal silhouette are within normal limits. Atherosclerotic calcifications in the thoracic aorta. Old healed fractures of the anterolateral aspects of the left first and second ribs are incidentally noted. IMPRESSION: 1. Low lung volumes without radiographic evidence of acute cardiopulmonary disease. 2. Aortic atherosclerosis. Electronically Signed   By:  Vinnie Langton M.D.   On: 05/25/2022 10:50    Procedures Procedures    Medications Ordered in ED Medications  acetaminophen (TYLENOL) tablet 1,000 mg (1,000 mg Oral Given 05/25/22 1055)  benzonatate (TESSALON) capsule 100 mg (100 mg Oral Given 05/25/22 1056)  ipratropium-albuterol (DUONEB) 0.5-2.5 (3) MG/3ML nebulizer solution 3 mL (3 mLs Nebulization Given 05/25/22 1119)  albuterol (VENTOLIN HFA) 108 (90 Base) MCG/ACT inhaler 2 puff (2 puffs Inhalation Given 05/25/22 1255)    ED Course/ Medical Decision Making/ A&P                           Medical Decision Making Amount and/or Complexity of Data Reviewed Labs: ordered. Radiology: ordered.  Risk OTC drugs. Prescription drug management.   88 y.o. male presents to the ED with complaints of cough, congestion, sore throat and diarrhea, this involves an extensive number of treatment options, and is a complaint that carries with it a high risk of complications and morbidity.  The differential diagnosis includes   On arrival pt is nontoxic, vitals overall reassuring, patient afebrile, satting well on room air. Exam significant for some faint expiratory wheezes, some nasal congestion and mild erythema of the oropharynx, no chest pain or increased work of breathing and no abdominal tenderness  Additional history obtained from caregiver at bedside. Previous records obtained and reviewed   I ordered medications including, Tylenol, Tessalon Perles and DuoNeb for wheezing cough  Lab Tests:  I Ordered, reviewed, and interpreted labs, which included: No leukocytosis, stable hemoglobin, no significant electrolyte derangements, renal function at baseline, negative strep, COVID and flu testing  Imaging Studies ordered:  I ordered imaging studies which included chest x-ray, I independently visualized and interpreted imaging which showed no evidence of pneumonia or other active cardiopulmonary disease.  ED Course:   On reevaluation patient is  feeling better has had a decrease in cough with Tessalon and wheezing cleared with DuoNeb.  Discussed results of reassuring work-up.  Suspect likely viral upper respiratory infection, but given patient's mild  wheezing could have developing pneumonia, will treat with course of doxycycline, patient provided an inhaler to use as needed at home and given prescription for [facility].  Discussed outpatient follow-up and strict return precautions with patient and caregiver at bedside.  At this time there does not appear to be any evidence of an acute emergency medical condition requiring further emergent evaluation and the patient appears stable for discharge with appropriate outpatient follow up. Diagnosis and return precautions discussed with patient who verbalizes understanding and is agreeable to discharge.       Portions of this note were generated with [employer]. Dictation errors may occur despite best attempts at proofreading. `        Final Clinical Impression(s) / ED Diagnoses Final diagnoses:  Viral URI with cough    Rx / DC Orders ED Discharge Orders          Ordered    doxycycline (VIBRAMYCIN) 100 MG capsule  2 times daily        05/25/22 1244    benzonatate (TESSALON) 100 MG capsule  3 times daily PRN        05/25/22 1244              Jacqlyn Larsen, [place] 05/25/22 1304    Noemi Chapel, MD 05/26/22 [phone]

## 2022-05-25 NOTE — ED Notes (Signed)
Drained pts leg bag. Pt had around 421ms of output. Nurse notified.

## 2022-05-25 NOTE — Discharge Instructions (Addendum)
Symptoms are likely due to a viral upper respiratory infection.  Your chest x-ray today did not show a pneumonia but given productive cough will prescribe antibiotics twice daily for the next 7 days to treat for any possible brewing pneumonia.  In addition to this you can use prescribed cough medicine 3 times a day as needed and you can use the albuterol inhaler every 6 hours as needed for cough or shortness of breath.  You can use Tylenol as needed for pain.  If symptoms or not improving over the next few days please follow-up closely with your primary care doctor.  If you develop fevers, chest pain, shortness of breath or difficulty breathing or other new or concerning symptoms return to the emergency department for reevaluation.

## 2022-05-25 NOTE — ED Triage Notes (Signed)
Pt c/o diarrhea, cough, congestion, sore throat since Sat.

## 2022-05-27 DIAGNOSIS — Z9181 History of falling: Secondary | ICD-10-CM | POA: Diagnosis not present

## 2022-05-27 DIAGNOSIS — R531 Weakness: Secondary | ICD-10-CM | POA: Diagnosis not present

## 2022-05-27 DIAGNOSIS — R2689 Other abnormalities of gait and mobility: Secondary | ICD-10-CM | POA: Diagnosis not present

## 2022-05-27 DIAGNOSIS — M2569 Stiffness of other specified joint, not elsewhere classified: Secondary | ICD-10-CM | POA: Diagnosis not present

## 2022-06-03 DIAGNOSIS — Z9181 History of falling: Secondary | ICD-10-CM | POA: Diagnosis not present

## 2022-06-03 DIAGNOSIS — R2689 Other abnormalities of gait and mobility: Secondary | ICD-10-CM | POA: Diagnosis not present

## 2022-06-03 DIAGNOSIS — R531 Weakness: Secondary | ICD-10-CM | POA: Diagnosis not present

## 2022-06-03 DIAGNOSIS — M2569 Stiffness of other specified joint, not elsewhere classified: Secondary | ICD-10-CM | POA: Diagnosis not present

## 2022-06-04 DIAGNOSIS — M2569 Stiffness of other specified joint, not elsewhere classified: Secondary | ICD-10-CM | POA: Diagnosis not present

## 2022-06-04 DIAGNOSIS — Z9181 History of falling: Secondary | ICD-10-CM | POA: Diagnosis not present

## 2022-06-04 DIAGNOSIS — R531 Weakness: Secondary | ICD-10-CM | POA: Diagnosis not present

## 2022-06-04 DIAGNOSIS — R2689 Other abnormalities of gait and mobility: Secondary | ICD-10-CM | POA: Diagnosis not present

## 2022-06-09 DIAGNOSIS — M2569 Stiffness of other specified joint, not elsewhere classified: Secondary | ICD-10-CM | POA: Diagnosis not present

## 2022-06-09 DIAGNOSIS — R531 Weakness: Secondary | ICD-10-CM | POA: Diagnosis not present

## 2022-06-09 DIAGNOSIS — Z9181 History of falling: Secondary | ICD-10-CM | POA: Diagnosis not present

## 2022-06-09 DIAGNOSIS — R2689 Other abnormalities of gait and mobility: Secondary | ICD-10-CM | POA: Diagnosis not present

## 2022-06-11 DIAGNOSIS — R2689 Other abnormalities of gait and mobility: Secondary | ICD-10-CM | POA: Diagnosis not present

## 2022-06-11 DIAGNOSIS — R531 Weakness: Secondary | ICD-10-CM | POA: Diagnosis not present

## 2022-06-11 DIAGNOSIS — M2569 Stiffness of other specified joint, not elsewhere classified: Secondary | ICD-10-CM | POA: Diagnosis not present

## 2022-06-11 DIAGNOSIS — Z9181 History of falling: Secondary | ICD-10-CM | POA: Diagnosis not present

## 2022-06-16 DIAGNOSIS — R531 Weakness: Secondary | ICD-10-CM | POA: Diagnosis not present

## 2022-06-16 DIAGNOSIS — R2689 Other abnormalities of gait and mobility: Secondary | ICD-10-CM | POA: Diagnosis not present

## 2022-06-16 DIAGNOSIS — Z9181 History of falling: Secondary | ICD-10-CM | POA: Diagnosis not present

## 2022-06-16 DIAGNOSIS — M2569 Stiffness of other specified joint, not elsewhere classified: Secondary | ICD-10-CM | POA: Diagnosis not present

## 2022-06-22 ENCOUNTER — Ambulatory Visit: Payer: Medicare Other | Admitting: Physician Assistant

## 2022-06-22 ENCOUNTER — Ambulatory Visit (INDEPENDENT_AMBULATORY_CARE_PROVIDER_SITE_OTHER): Payer: Medicare Other | Admitting: Urology

## 2022-06-22 DIAGNOSIS — R339 Retention of urine, unspecified: Secondary | ICD-10-CM

## 2022-06-22 NOTE — Progress Notes (Signed)
Cath Change/ Replacement  Patient is present today for a catheter change due to urinary retention.  28m of water was removed from the balloon, a 22FR foley cath was removed with out difficulty.  Patient was cleaned and prepped in a sterile fashion with betadine. A 22 FR foley cath was replaced into the bladder no complications were noted Urine return was noted 356mand urine was yellow in color. The balloon was filled with 1030mf sterile water. A leg bag was attached for drainage.  A night bag was also given to the patient and patient was given instruction on how to change from one bag to another. Patient was given proper instruction on catheter care.    Performed by: Lamberto Dinapoli LPN  Follow up: 1 month cath change

## 2022-06-26 DIAGNOSIS — I10 Essential (primary) hypertension: Secondary | ICD-10-CM | POA: Diagnosis not present

## 2022-06-26 DIAGNOSIS — E782 Mixed hyperlipidemia: Secondary | ICD-10-CM | POA: Diagnosis not present

## 2022-07-10 DIAGNOSIS — H02831 Dermatochalasis of right upper eyelid: Secondary | ICD-10-CM | POA: Diagnosis not present

## 2022-07-10 DIAGNOSIS — H02834 Dermatochalasis of left upper eyelid: Secondary | ICD-10-CM | POA: Diagnosis not present

## 2022-07-10 DIAGNOSIS — H18413 Arcus senilis, bilateral: Secondary | ICD-10-CM | POA: Diagnosis not present

## 2022-07-10 DIAGNOSIS — H43813 Vitreous degeneration, bilateral: Secondary | ICD-10-CM | POA: Diagnosis not present

## 2022-07-10 DIAGNOSIS — H353231 Exudative age-related macular degeneration, bilateral, with active choroidal neovascularization: Secondary | ICD-10-CM | POA: Diagnosis not present

## 2022-07-10 DIAGNOSIS — H35363 Drusen (degenerative) of macula, bilateral: Secondary | ICD-10-CM | POA: Diagnosis not present

## 2022-07-10 DIAGNOSIS — Z83518 Family history of other specified eye disorder: Secondary | ICD-10-CM | POA: Diagnosis not present

## 2022-07-10 DIAGNOSIS — H2513 Age-related nuclear cataract, bilateral: Secondary | ICD-10-CM | POA: Diagnosis not present

## 2022-07-20 ENCOUNTER — Ambulatory Visit (INDEPENDENT_AMBULATORY_CARE_PROVIDER_SITE_OTHER): Payer: Medicare Other | Admitting: Physician Assistant

## 2022-07-20 VITALS — BP 131/66 | HR 85

## 2022-07-20 DIAGNOSIS — R339 Retention of urine, unspecified: Secondary | ICD-10-CM | POA: Diagnosis not present

## 2022-07-20 NOTE — Patient Instructions (Signed)

## 2022-07-20 NOTE — Progress Notes (Signed)
Cath Change/ Replacement  Patient is present today for a catheter change due to urinary retention.  22m of water was removed from the balloon, a 22FR foley cath was removed with out difficulty.  Patient was cleaned and prepped in a sterile fashion with betadine. A 22 FR foley cath was replaced into the bladder no complications were noted. Catheter flush25cc sterile water with return. The balloon was filled with 125mof sterile water. A leg bag was attached for drainage.  A night bag was also given to the patient and patient was given instruction on how to change from one bag to another. Patient was given proper instruction on catheter care.    Performed by: Hope, LPN , [facility] [place]Follow up: Keep next schedule appt.

## 2022-08-03 ENCOUNTER — Telehealth: Payer: Self-pay

## 2022-08-03 ENCOUNTER — Ambulatory Visit (INDEPENDENT_AMBULATORY_CARE_PROVIDER_SITE_OTHER): Payer: Medicare Other | Admitting: Urology

## 2022-08-03 DIAGNOSIS — T839XXD Unspecified complication of genitourinary prosthetic device, implant and graft, subsequent encounter: Secondary | ICD-10-CM | POA: Diagnosis not present

## 2022-08-03 NOTE — Progress Notes (Signed)
See Hope Cobb, LPN note

## 2022-08-03 NOTE — Telephone Encounter (Signed)
Patient seen in office today. 

## 2022-08-03 NOTE — Patient Instructions (Signed)

## 2022-08-03 NOTE — Progress Notes (Signed)
Patient present in office concerned catheter was pulled out to far. Leg bag noted to be completely full. Leg bag drained around 600ccs. Catheter balloon deflated and catheter pushed in slightly without difficulty. Balloon inflated and new catheter guard placed. Urine noted to be draining freely into leg bag.Reiterated to patient that he needs to empty the bag more frequently. Patient voiced understanding and states catheter feels much better.

## 2022-08-17 DIAGNOSIS — H353222 Exudative age-related macular degeneration, left eye, with inactive choroidal neovascularization: Secondary | ICD-10-CM | POA: Diagnosis not present

## 2022-08-17 DIAGNOSIS — H18413 Arcus senilis, bilateral: Secondary | ICD-10-CM | POA: Diagnosis not present

## 2022-08-17 DIAGNOSIS — H02834 Dermatochalasis of left upper eyelid: Secondary | ICD-10-CM | POA: Diagnosis not present

## 2022-08-17 DIAGNOSIS — H2513 Age-related nuclear cataract, bilateral: Secondary | ICD-10-CM | POA: Diagnosis not present

## 2022-08-17 DIAGNOSIS — H43813 Vitreous degeneration, bilateral: Secondary | ICD-10-CM | POA: Diagnosis not present

## 2022-08-17 DIAGNOSIS — Z83518 Family history of other specified eye disorder: Secondary | ICD-10-CM | POA: Diagnosis not present

## 2022-08-17 DIAGNOSIS — H35363 Drusen (degenerative) of macula, bilateral: Secondary | ICD-10-CM | POA: Diagnosis not present

## 2022-08-17 DIAGNOSIS — H02831 Dermatochalasis of right upper eyelid: Secondary | ICD-10-CM | POA: Diagnosis not present

## 2022-08-17 DIAGNOSIS — H353211 Exudative age-related macular degeneration, right eye, with active choroidal neovascularization: Secondary | ICD-10-CM | POA: Diagnosis not present

## 2022-08-17 DIAGNOSIS — H269 Unspecified cataract: Secondary | ICD-10-CM | POA: Diagnosis not present

## 2022-08-20 ENCOUNTER — Ambulatory Visit (INDEPENDENT_AMBULATORY_CARE_PROVIDER_SITE_OTHER): Payer: Medicare Other | Admitting: Physician Assistant

## 2022-08-20 DIAGNOSIS — R339 Retention of urine, unspecified: Secondary | ICD-10-CM

## 2022-08-24 DIAGNOSIS — Z6835 Body mass index (BMI) 35.0-35.9, adult: Secondary | ICD-10-CM | POA: Diagnosis not present

## 2022-08-24 DIAGNOSIS — M7989 Other specified soft tissue disorders: Secondary | ICD-10-CM | POA: Diagnosis not present

## 2022-08-24 DIAGNOSIS — R609 Edema, unspecified: Secondary | ICD-10-CM | POA: Diagnosis not present

## 2022-08-25 ENCOUNTER — Other Ambulatory Visit (HOSPITAL_COMMUNITY): Payer: Self-pay | Admitting: Family Medicine

## 2022-08-25 DIAGNOSIS — M79604 Pain in right leg: Secondary | ICD-10-CM

## 2022-08-25 DIAGNOSIS — M7989 Other specified soft tissue disorders: Secondary | ICD-10-CM

## 2022-08-25 NOTE — Progress Notes (Signed)
Cath Change/ Replacement  Patient is present today for a catheter change due to urinary retention.  25m of water was removed from the balloon, a 20FR foley cath was removed with out difficulty.  Patient was cleaned and prepped in a sterile fashion with betadine. A 20 FR foley cath was replaced into the bladder no complications were noted Urine return was noted 653mand urine was yellow in color. The balloon was filled with 1065mf sterile water. A leg bag was attached for drainage.  A night bag was also given to the patient and patient was given instruction on how to change from one bag to another. Patient was given proper instruction on catheter care.    Performed by: Adelynn Gipe LPN  Follow up: 1 month nv

## 2022-08-27 ENCOUNTER — Ambulatory Visit (HOSPITAL_COMMUNITY)
Admission: RE | Admit: 2022-08-27 | Discharge: 2022-08-27 | Disposition: A | Discharge status: Home / Self Care | Payer: Medicare Other | Source: Ambulatory Visit | Attending: Family Medicine | Admitting: Family Medicine

## 2022-08-27 DIAGNOSIS — R600 Localized edema: Secondary | ICD-10-CM | POA: Diagnosis not present

## 2022-08-27 DIAGNOSIS — M79604 Pain in right leg: Secondary | ICD-10-CM | POA: Insufficient documentation

## 2022-08-27 DIAGNOSIS — I10 Essential (primary) hypertension: Secondary | ICD-10-CM | POA: Diagnosis not present

## 2022-08-27 DIAGNOSIS — E782 Mixed hyperlipidemia: Secondary | ICD-10-CM | POA: Diagnosis not present

## 2022-08-27 DIAGNOSIS — M7989 Other specified soft tissue disorders: Secondary | ICD-10-CM | POA: Insufficient documentation

## 2022-08-27 DIAGNOSIS — M79661 Pain in right lower leg: Secondary | ICD-10-CM | POA: Diagnosis not present

## 2022-09-18 DIAGNOSIS — M7989 Other specified soft tissue disorders: Secondary | ICD-10-CM | POA: Diagnosis not present

## 2022-09-18 DIAGNOSIS — M79601 Pain in right arm: Secondary | ICD-10-CM | POA: Diagnosis not present

## 2022-09-18 DIAGNOSIS — E6609 Other obesity due to excess calories: Secondary | ICD-10-CM | POA: Diagnosis not present

## 2022-09-18 DIAGNOSIS — Z6836 Body mass index (BMI) 36.0-36.9, adult: Secondary | ICD-10-CM | POA: Diagnosis not present

## 2022-09-18 DIAGNOSIS — I8311 Varicose veins of right lower extremity with inflammation: Secondary | ICD-10-CM | POA: Diagnosis not present

## 2022-09-18 DIAGNOSIS — M79602 Pain in left arm: Secondary | ICD-10-CM | POA: Diagnosis not present

## 2022-09-18 DIAGNOSIS — R609 Edema, unspecified: Secondary | ICD-10-CM | POA: Diagnosis not present

## 2022-09-21 ENCOUNTER — Ambulatory Visit (INDEPENDENT_AMBULATORY_CARE_PROVIDER_SITE_OTHER): Payer: Medicare Other | Admitting: Physician Assistant

## 2022-09-21 DIAGNOSIS — R339 Retention of urine, unspecified: Secondary | ICD-10-CM | POA: Diagnosis not present

## 2022-09-21 NOTE — Progress Notes (Unsigned)
Cath Change/ Replacement  Patient is present today for a catheter change due to urinary retention.  33m of water was removed from the balloon, a 22FR foley cath was removed without difficulty.  Patient was cleaned and prepped in a sterile fashion with betadine.  A 22 FR foley cath was replaced into the bladder, no complications were noted. Urine return was noted 380mand urine was yellow in color. The balloon was filled with 1067mf sterile water. A leg bag was attached for drainage.  A night bag was also given to the patient and patient was given instruction on how to change from one bag to another. Patient was given proper instruction on catheter care.    Performed by: KouLevi AlandMA  Follow up: Follow up in one month for catheter change.    Procedure reviewed. Agree with clinical documentation.  Julienne A Summerlin PA-C

## 2022-10-13 DIAGNOSIS — N1831 Chronic kidney disease, stage 3a: Secondary | ICD-10-CM | POA: Diagnosis not present

## 2022-10-13 DIAGNOSIS — E6609 Other obesity due to excess calories: Secondary | ICD-10-CM | POA: Diagnosis not present

## 2022-10-13 DIAGNOSIS — J029 Acute pharyngitis, unspecified: Secondary | ICD-10-CM | POA: Diagnosis not present

## 2022-10-13 DIAGNOSIS — Z6835 Body mass index (BMI) 35.0-35.9, adult: Secondary | ICD-10-CM | POA: Diagnosis not present

## 2022-10-13 DIAGNOSIS — I4891 Unspecified atrial fibrillation: Secondary | ICD-10-CM | POA: Diagnosis not present

## 2022-10-19 DIAGNOSIS — H353222 Exudative age-related macular degeneration, left eye, with inactive choroidal neovascularization: Secondary | ICD-10-CM | POA: Diagnosis not present

## 2022-10-19 DIAGNOSIS — H353211 Exudative age-related macular degeneration, right eye, with active choroidal neovascularization: Secondary | ICD-10-CM | POA: Diagnosis not present

## 2022-10-26 ENCOUNTER — Ambulatory Visit (INDEPENDENT_AMBULATORY_CARE_PROVIDER_SITE_OTHER): Payer: Medicare Other | Admitting: Physician Assistant

## 2022-10-26 DIAGNOSIS — R339 Retention of urine, unspecified: Secondary | ICD-10-CM

## 2022-10-26 NOTE — Progress Notes (Signed)
Cath Change/ Replacement  Patient is present today for a catheter change due to urinary retention.  96m of water was removed from the balloon, a 22FR foley cath was removed without difficulty.  Patient was cleaned and prepped in a sterile fashion with betadine. A 22 FR foley cath was replaced into the bladder, no complications were noted. Urine return was noted 358mand urine was yellow in color. The balloon was filled with 1011mf sterile water. A leg bag was attached for drainage.  A night bag was also given to the patient and patient was given instruction on how to change from one bag to another. Patient was given proper instruction on catheter care.    Performed by: Chekesha Behlke LPN  Follow up: 1 month cath change  Procedure reviewed. Agree with clinical documentation.  Julienne A Summerlin PA-C

## 2022-10-26 NOTE — Patient Instructions (Signed)

## 2022-11-13 DIAGNOSIS — Z23 Encounter for immunization: Secondary | ICD-10-CM | POA: Diagnosis not present

## 2022-11-17 DIAGNOSIS — Z23 Encounter for immunization: Secondary | ICD-10-CM | POA: Diagnosis not present

## 2022-11-20 IMAGING — DX DG ABDOMEN 1V
2 series · 2 of 2 positions shown · non-contrast
Comparison: 01/09/2018

CLINICAL DATA: Constipation, urine retention

EXAM:
ABDOMEN - 1 VIEW

[abdomen supine (1 of 2)]
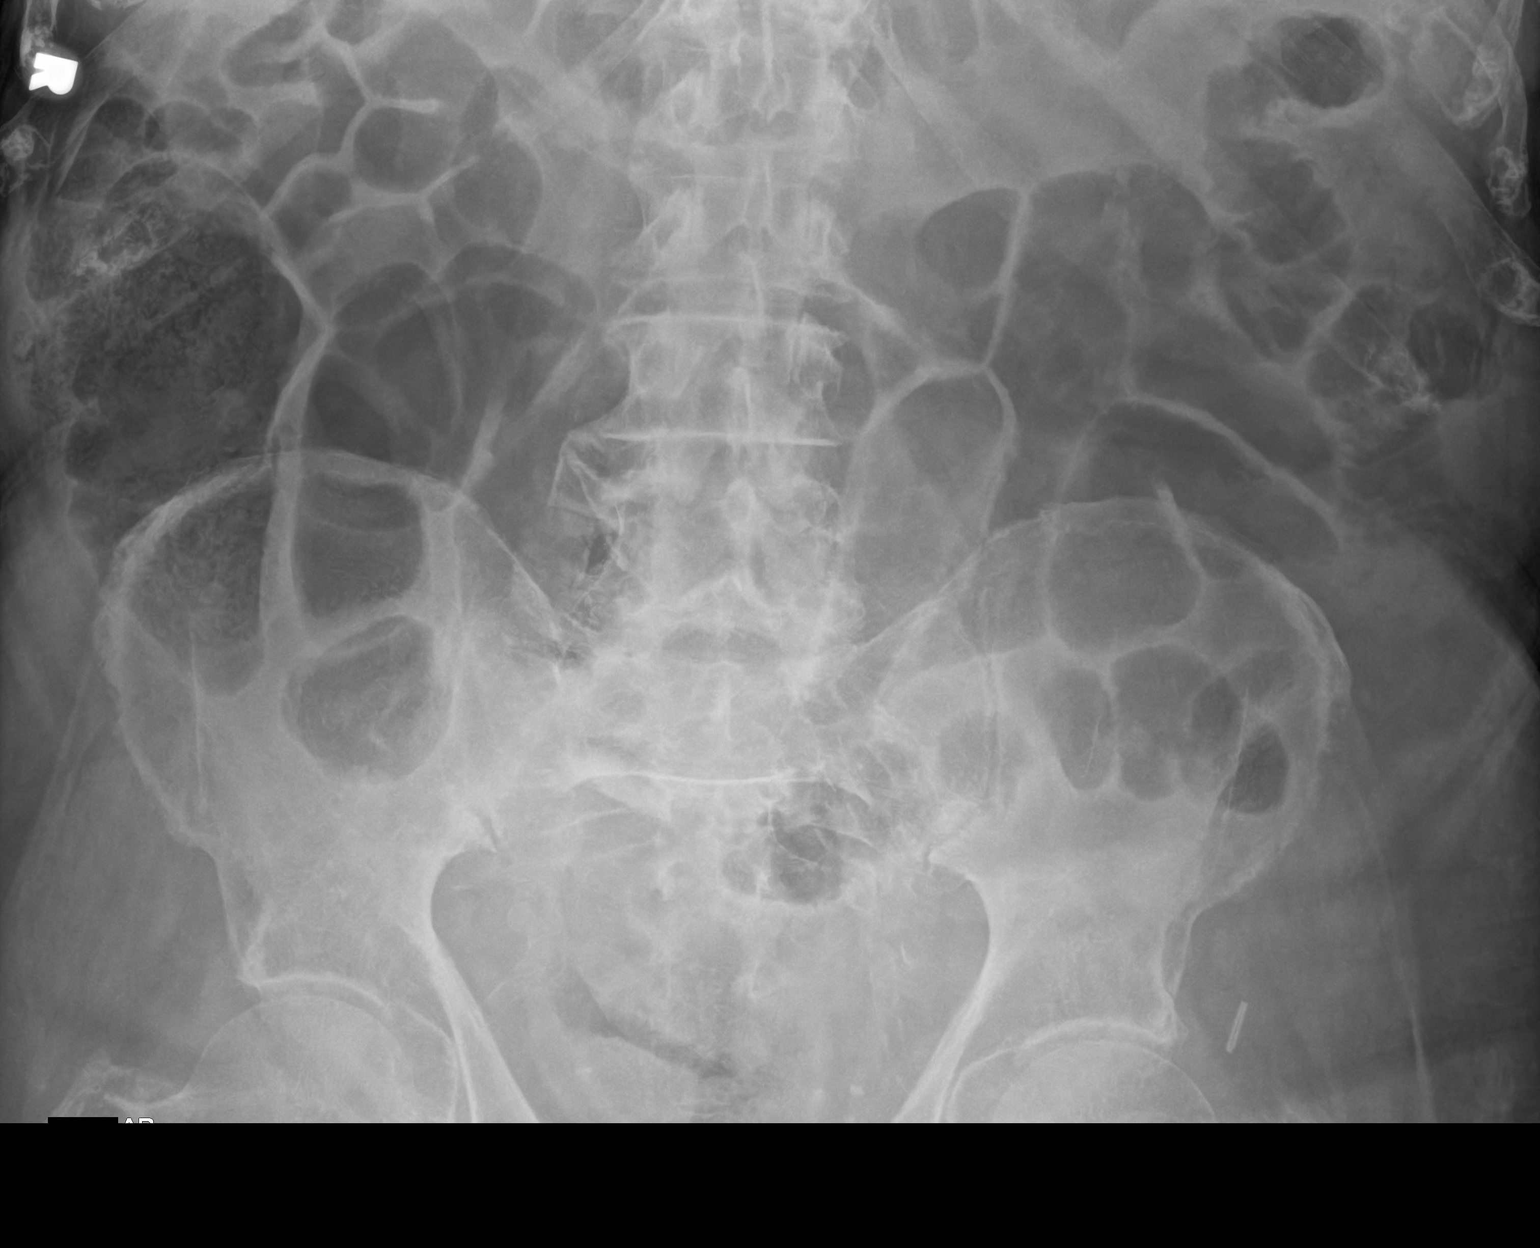

[abdomen supine (2 of 2)]
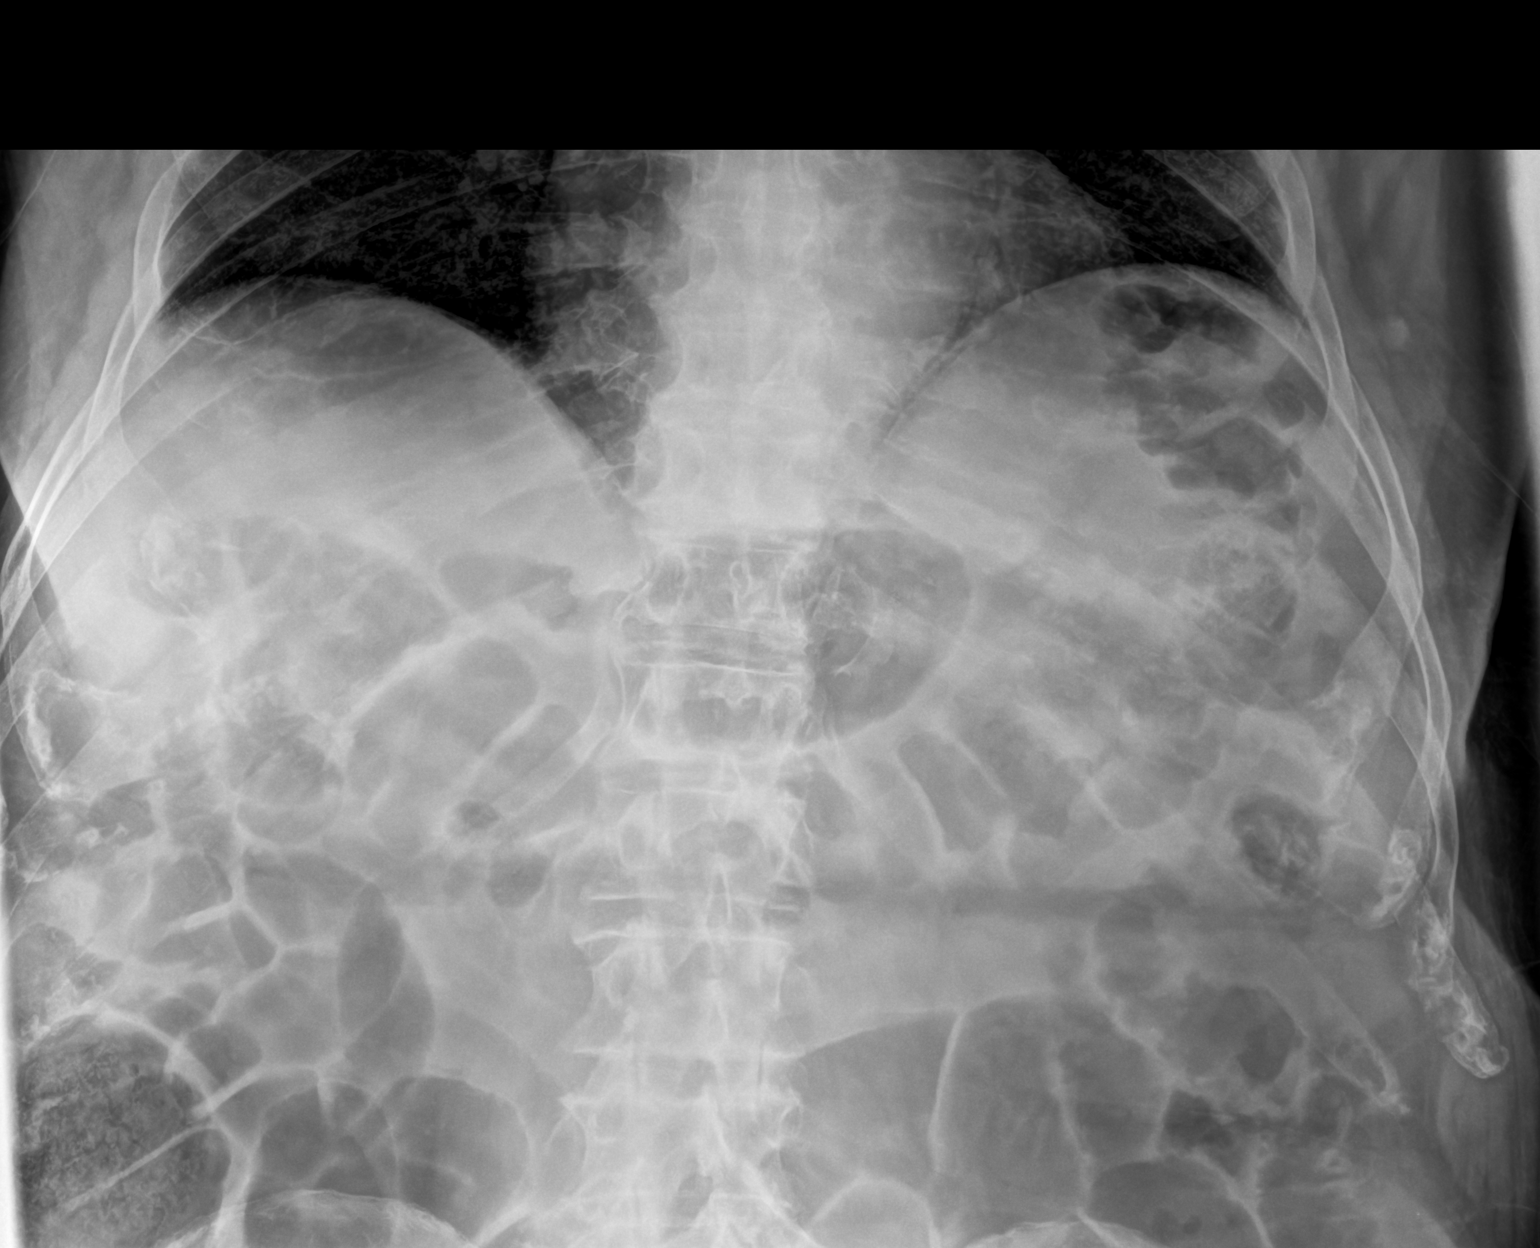

[2 of 2 positions shown; findings below may reference images not displayed]

FINDINGS: Curvilinear lucency projects along the medial left hemidiaphragm.
Gas distended nondilated small bowel and colon. Iliac arterial
calcifications. Lower lumbar spondylitic change. Surgical clips over
the left hip.
IMPRESSION: 1. Nonobstructive bowel gas pattern.
2. Nonspecific linear lucency adjacent to the left diaphragmatic
leaflet. Recommend horizontal beam radiograph if free
intraperitoneal gas is a clinical concern.

## 2022-11-24 ENCOUNTER — Ambulatory Visit (INDEPENDENT_AMBULATORY_CARE_PROVIDER_SITE_OTHER): Payer: Medicare Other | Admitting: Urology

## 2022-11-24 ENCOUNTER — Encounter: Payer: Self-pay | Admitting: Urology

## 2022-11-24 DIAGNOSIS — R339 Retention of urine, unspecified: Secondary | ICD-10-CM

## 2022-11-24 NOTE — Progress Notes (Signed)
Cath Change/ Replacement  Patient is present today for a catheter change due to urinary retention.  12m of water was removed from the balloon, a 22FR foley cath was removed without difficulty.  Patient was cleaned and prepped in a sterile fashion with betadine. A 22 FR foley cath was replaced into the bladder, no complications were noted. Urine return was noted 335mand urine was yellow in color. The balloon was filled with 1020mf sterile water. A leg bag was attached for drainage.  A night bag was also given to the patient and patient was given instruction on how to change from one bag to another. Patient was given proper instruction on catheter care.    Performed by: Hope LPN  Follow up: keep scheduled NV

## 2022-11-24 NOTE — Patient Instructions (Signed)

## 2022-12-18 ENCOUNTER — Ambulatory Visit (INDEPENDENT_AMBULATORY_CARE_PROVIDER_SITE_OTHER): Payer: Medicare Other | Admitting: Urology

## 2022-12-18 DIAGNOSIS — R339 Retention of urine, unspecified: Secondary | ICD-10-CM

## 2022-12-18 NOTE — Progress Notes (Signed)
Cath Change/ Replacement  Patient is present today for a catheter change due to urinary retention.  44m of water was removed from the balloon, a 22FR foley cath was removed without difficulty.  Patient was cleaned and prepped in a sterile fashion with betadine and 2% lidocaine jelly was instilled into the urethra. A 22 FR coude (per Dr. MAlyson Ingles foley cath was replaced into the bladder, no complications were noted. Urine return was noted 158mand urine was yellow in color. The balloon was filled with 1026mf sterile water. A leg bag was attached for drainage.  A night bag was also given to the patient and patient was given instruction on how to change from one bag to another. Patient was given proper instruction on catheter care.    Performed by: ShaYOVZCHYIA Follow up: keep 1 month cath change

## 2023-01-06 DIAGNOSIS — M1990 Unspecified osteoarthritis, unspecified site: Secondary | ICD-10-CM | POA: Diagnosis not present

## 2023-01-06 DIAGNOSIS — E6609 Other obesity due to excess calories: Secondary | ICD-10-CM | POA: Diagnosis not present

## 2023-01-06 DIAGNOSIS — Z6835 Body mass index (BMI) 35.0-35.9, adult: Secondary | ICD-10-CM | POA: Diagnosis not present

## 2023-01-06 DIAGNOSIS — K649 Unspecified hemorrhoids: Secondary | ICD-10-CM | POA: Diagnosis not present

## 2023-01-19 ENCOUNTER — Ambulatory Visit: Payer: Medicare Other

## 2023-01-20 ENCOUNTER — Ambulatory Visit (INDEPENDENT_AMBULATORY_CARE_PROVIDER_SITE_OTHER): Payer: Medicare Other | Admitting: Urology

## 2023-01-20 DIAGNOSIS — R339 Retention of urine, unspecified: Secondary | ICD-10-CM

## 2023-01-20 NOTE — Progress Notes (Signed)
Cath Change/ Replacement  Patient is present today for a catheter change due to urinary retention.  69m of water was removed from the balloon, a 22FR foley cath was removed without difficulty.  Patient was cleaned and prepped in a sterile fashion with betadine and 2% lidocaine jelly was instilled into the urethra. A 22 FR foley cath was replaced into the bladder, no complications were noted. Urine return was noted 268mand urine was yellow in color. The balloon was filled with 1043mf sterile water. A leg bag was attached for drainage.  A night bag was also given to the patient and patient was given instruction on how to change from one bag to another. Patient was given proper instruction on catheter care.    Performed by: KouLevi AlandMA  Follow up: Follow up as scheduled.

## 2023-02-16 ENCOUNTER — Ambulatory Visit (INDEPENDENT_AMBULATORY_CARE_PROVIDER_SITE_OTHER): Payer: Medicare Other | Admitting: Urology

## 2023-02-16 DIAGNOSIS — R339 Retention of urine, unspecified: Secondary | ICD-10-CM | POA: Diagnosis not present

## 2023-02-16 MED ORDER — AMBULATORY NON FORMULARY MEDICATION
11 refills | Status: DC
Start: 2023-02-16 — End: 2023-07-03

## 2023-02-16 NOTE — Progress Notes (Addendum)
Cath Change/ Replacement  Patient is present today for a catheter change due to urinary retention.  80m of water was removed from the balloon, a 22FR foley cath was removed without difficulty.  Patient was cleaned and prepped in a sterile fashion with betadine and 2% lidocaine jelly was instilled into the urethra. A 22 FR foley cath was replaced into the bladder, no complications were noted. Urine return was noted 342mand urine was yellow in color. The balloon was filled with 10106mf sterile water. A leg bag was attached for drainage.  A night bag was also given to the patient and patient was given instruction on how to change from one bag to another. Patient was given proper instruction on catheter care.    Performed by: KouLevi AlandMA  Follow up: Follow up in 1 month

## 2023-02-17 ENCOUNTER — Ambulatory Visit: Payer: Medicare Other

## 2023-02-23 DIAGNOSIS — J029 Acute pharyngitis, unspecified: Secondary | ICD-10-CM | POA: Diagnosis not present

## 2023-02-23 DIAGNOSIS — E669 Obesity, unspecified: Secondary | ICD-10-CM | POA: Diagnosis not present

## 2023-02-23 DIAGNOSIS — J019 Acute sinusitis, unspecified: Secondary | ICD-10-CM | POA: Diagnosis not present

## 2023-02-23 DIAGNOSIS — Z6838 Body mass index (BMI) 38.0-38.9, adult: Secondary | ICD-10-CM | POA: Diagnosis not present

## 2023-02-25 IMAGING — DX DG CHEST 1V PORT
1 series · 1 of 1 positions shown · non-contrast
Comparison: Chest x-ray 07/19/2021.

CLINICAL DATA: [AGE] male with history of diarrhea, cough and
congestion. Sore throat.

EXAM:
PORTABLE CHEST 1 VIEW

[chest ap]
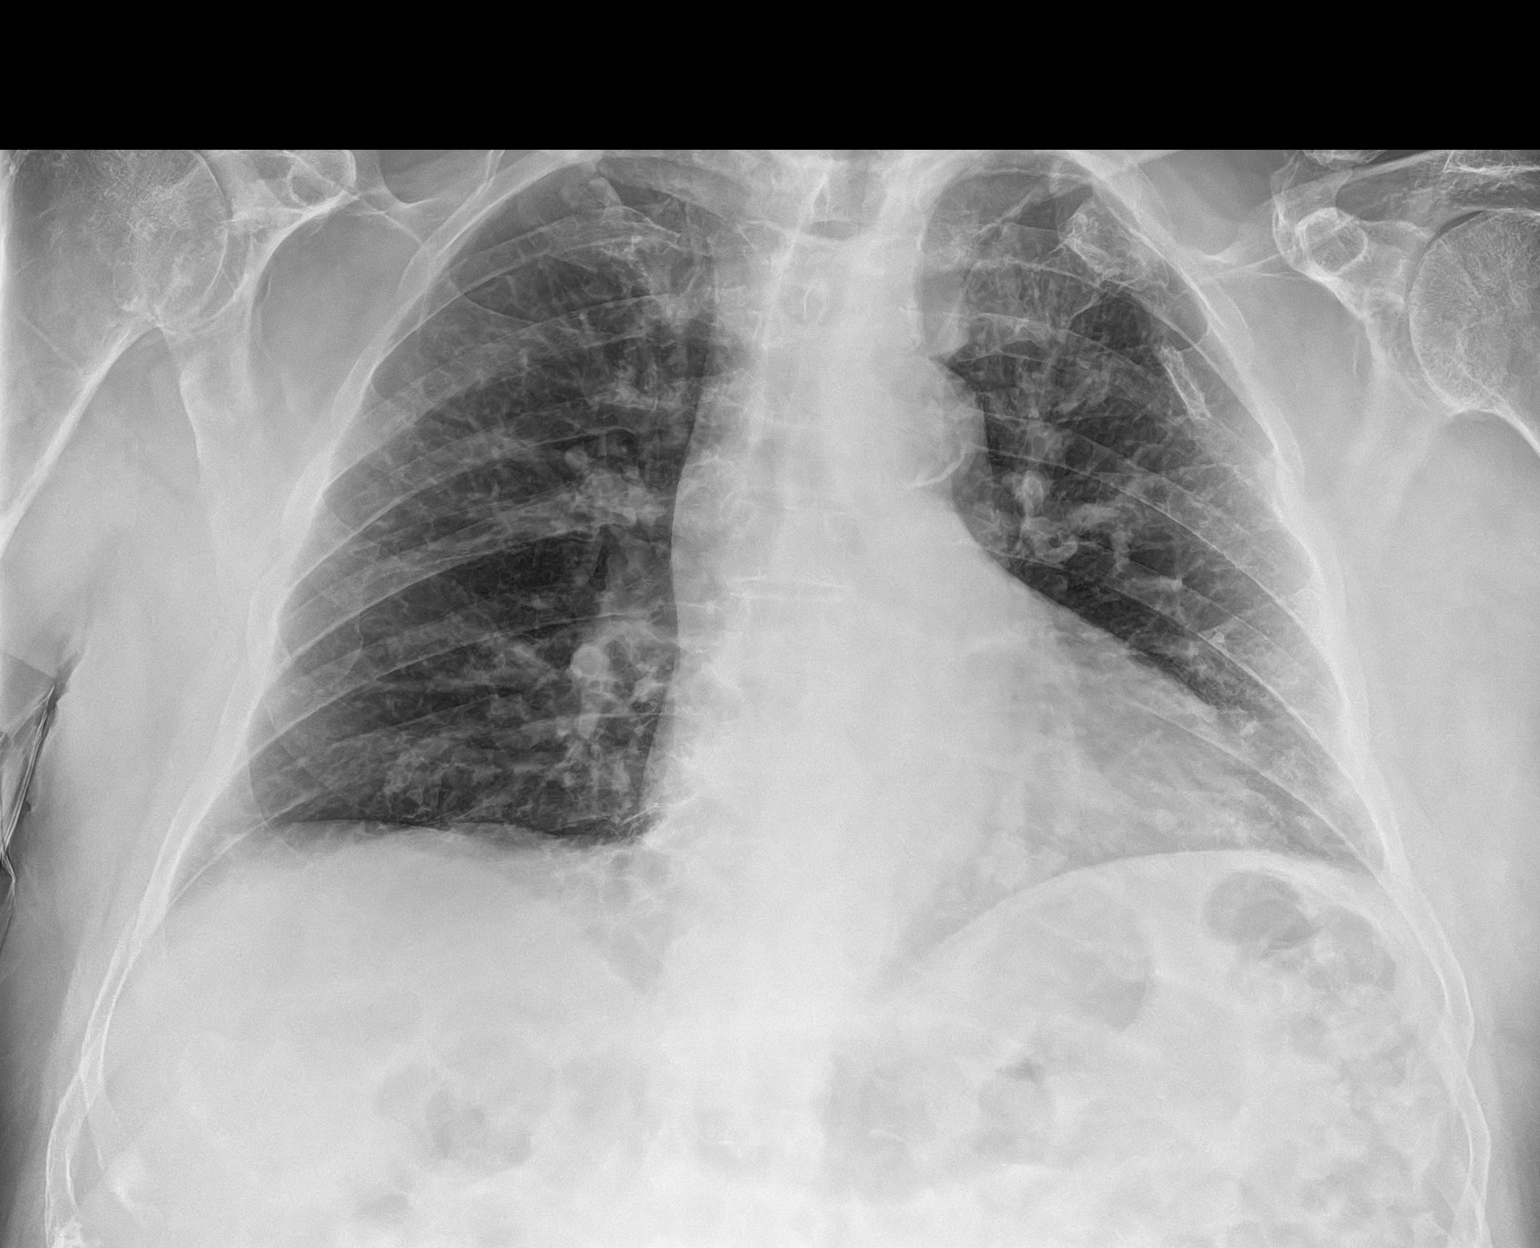

[1 of 1 positions shown; findings below may reference images not displayed]

FINDINGS: Lung volumes are low. No consolidative airspace disease. No pleural
effusions. No pneumothorax. No pulmonary nodule or mass noted.
Pulmonary vasculature and the cardiomediastinal silhouette are
within normal limits. Atherosclerotic calcifications in the thoracic
aorta. Old healed fractures of the anterolateral aspects of the left
first and second ribs are incidentally noted.
IMPRESSION: 1. Low lung volumes without radiographic evidence of acute
cardiopulmonary disease.
2. Aortic atherosclerosis.

## 2023-03-15 ENCOUNTER — Ambulatory Visit (INDEPENDENT_AMBULATORY_CARE_PROVIDER_SITE_OTHER): Payer: Medicare Other | Admitting: Urology

## 2023-03-15 DIAGNOSIS — R339 Retention of urine, unspecified: Secondary | ICD-10-CM | POA: Diagnosis not present

## 2023-03-15 NOTE — Patient Instructions (Signed)

## 2023-03-15 NOTE — Progress Notes (Signed)
Cath Change/ Replacement  Patient is present today for a catheter change due to urinary retention.  75ml of water was removed from the balloon, a 22FR foley cath was removed without difficulty.  Patient was cleaned and prepped in a sterile fashion with betadine.  A 22 FR foley cath was replaced into the bladder, no complications were noted. Urine return was noted 73ml and urine was yellow in color. The balloon was filled with 33ml of sterile water. A leg bag was attached for drainage.  Patient was given proper instruction on catheter care.    Performed by: Tifanny Dollens LPN  Follow up: keep scheduled NV

## 2023-03-17 ENCOUNTER — Ambulatory Visit: Payer: Medicare Other

## 2023-03-18 ENCOUNTER — Ambulatory Visit: Payer: Medicare Other

## 2023-03-26 ENCOUNTER — Ambulatory Visit: Payer: Medicare Other | Admitting: Urology

## 2023-04-07 ENCOUNTER — Encounter: Payer: Self-pay | Admitting: Urology

## 2023-04-07 ENCOUNTER — Ambulatory Visit (INDEPENDENT_AMBULATORY_CARE_PROVIDER_SITE_OTHER): Payer: Medicare Other | Admitting: Urology

## 2023-04-07 VITALS — BP 120/72 | HR 76

## 2023-04-07 DIAGNOSIS — R339 Retention of urine, unspecified: Secondary | ICD-10-CM

## 2023-04-07 NOTE — Progress Notes (Unsigned)
04/07/2023 10:54 AM   Caleb [name] 08/30/1925 [id number]  Referring provider: Assunta Found, MD [address] [place],  [place] [address]  Followup urinary retention    HPI: Caleb [name] is a 89yo here for followup for urinary retention. No UTIs since last visit. No hematuria. He is doing well with foley in perineal urethrostomy. He denies pelvic pain. No other complaints today  PMH: Past Medical History:  Diagnosis Date   Coronary artery disease    Hyperlipidemia    Obesity    Penile cancer (HCC)    Swelling of extremity    Left Leg    Surgical History: Past Surgical History:  Procedure Laterality Date   APPENDECTOMY     BACK SURGERY     BIOPSY  07/22/2021   Procedure: BIOPSY;  Surgeon: Lynann Bologna, MD;  Location: [facility] ENDOSCOPY;  Service: Gastroenterology;;   CHOLECYSTECTOMY     CORONARY ANGIOPLASTY  11/2005   RCA PCI AND STENTING WITH A CYPHER DES   CORONARY ARTERY BYPASS GRAFT     ESOPHAGOGASTRODUODENOSCOPY (EGD) WITH PROPOFOL N/A 07/22/2021   Procedure: ESOPHAGOGASTRODUODENOSCOPY (EGD) WITH PROPOFOL;  Surgeon: Lynann Bologna, MD;  Location: [facility] ENDOSCOPY;  Service: Gastroenterology;  Laterality: N/A;   HIATAL HERNIA REPAIR     lymph node removal     NM MYOCAR MULTIPLE W/SPECT  11/26/2009   EF 62%. NORMAL MYOCARDIAL PERFUSION STUDY.   TRANSTHORACIC ECHOCARDIOGRAM  12/22/2005   MILD TO MOD AORTIC SCLEROSIS W/O STENOSIS. MILD TO MODERATE MITRAL CALCIFICATION. LA- MILDLY DILATED.    Home Medications:  Allergies as of 04/07/2023   No Known Allergies      Medication List        Accurate as of April 07, 2023 10:54 AM. If you have any questions, ask your nurse or doctor.          acetaminophen 325 MG tablet Commonly known as: TYLENOL Take 2 tablets (650 mg total) by mouth every 6 (six) hours as needed for mild pain (or Fever >/= 101).   AMBULATORY NON FORMULARY MEDICATION Change drainage bags as needed.   aspirin EC 81 MG tablet Take 1 tablet (81 mg  total) by mouth daily.   benzonatate 100 MG capsule Commonly known as: TESSALON Take 1 capsule (100 mg total) by mouth 3 (three) times daily as needed for cough.   CALCIUM-D PO Take 1 tablet by mouth daily.   doxycycline 100 MG capsule Commonly known as: VIBRAMYCIN Take 1 capsule (100 mg total) by mouth 2 (two) times daily. One po bid x 7 days   furosemide 40 MG tablet Commonly known as: LASIX Take 40 mg by mouth daily.   lactulose 10 GM/15ML solution Commonly known as: CHRONULAC Take 15 mLs (10 g total) by mouth 2 (two) times daily as needed for mild constipation.   metoprolol tartrate 25 MG tablet Commonly known as: LOPRESSOR Take 0.5 tablets (12.5 mg total) by mouth 2 (two) times daily.   multivitamin with minerals Tabs tablet Take 1 tablet by mouth daily. Centrum Silver   pantoprazole 40 MG tablet Commonly known as: PROTONIX Take 1 tablet (40 mg total) by mouth daily. Give one tablet by mouth twice daily for Upper GI Bleed x 8wks   polyethylene glycol powder 17 GM/SCOOP powder Commonly known as: GLYCOLAX/MIRALAX Take 17 g by mouth daily.   PreserVision AREDS 2 Caps Take by mouth.   simvastatin 20 MG tablet Commonly known as: ZOCOR Take 1 tablet (20 mg total) by mouth at bedtime.  Allergies: No Known Allergies  Family History: Family History  Problem Relation Age of Onset   Cancer Father    Cancer Sister    Cancer Brother    Cancer Sister    Cancer Other     Social History:  reports that he quit smoking about 39 years ago. His smoking use included cigarettes. He has never used smokeless tobacco. He reports that he does not drink alcohol and does not use drugs.  ROS: All other review of systems were reviewed and are negative except what is noted above in HPI  Physical Exam: BP 120/72   Pulse 76   Constitutional:  Alert and oriented, No acute distress. HEENT: [place] AT, moist mucus membranes.  Trachea midline, no masses. Cardiovascular: No  clubbing, cyanosis, or edema. Respiratory: Normal respiratory effort, no increased work of breathing. GI: Abdomen is soft, nontender, nondistended, no abdominal masses GU: No CVA tenderness.  Lymph: No cervical or inguinal lymphadenopathy. Skin: No rashes, bruises or suspicious lesions. Neurologic: Grossly intact, no focal deficits, moving all 4 extremities. Psychiatric: Normal mood and affect.  Laboratory Data: Lab Results  Component Value Date   WBC 9.8 05/25/2022   HGB 11.5 (L) 05/25/2022   HCT 35.9 (L) 05/25/2022   MCV 99.2 05/25/2022   PLT 155 05/25/2022    Lab Results  Component Value Date   CREATININE 1.46 (H) 05/25/2022    No results found for: "PSA"  No results found for: "TESTOSTERONE"  Lab Results  Component Value Date   HGBA1C 5.3 11/12/2021    Urinalysis    Component Value Date/Time   COLORURINE YELLOW 02/01/2022 1829   APPEARANCEUR CLOUDY (A) 02/01/2022 1829   LABSPEC 1.015 02/01/2022 1829   PHURINE 8.5 (H) 02/01/2022 1829   GLUCOSEU NEGATIVE 02/01/2022 1829   HGBUR LARGE (A) 02/01/2022 1829   BILIRUBINUR NEGATIVE 02/01/2022 1829   BILIRUBINUR negative 03/25/2020 1156   KETONESUR NEGATIVE 02/01/2022 1829   PROTEINUR >300 (A) 02/01/2022 1829   UROBILINOGEN 0.2 03/25/2020 1156   NITRITE NEGATIVE 02/01/2022 1829   LEUKOCYTESUR MODERATE (A) 02/01/2022 1829    Lab Results  Component Value Date   BACTERIA MANY (A) 02/01/2022    Pertinent Imaging:  Results for orders placed during the hospital encounter of 02/17/22  DG Abdomen 1 View  Narrative CLINICAL DATA:  Constipation, urine retention  EXAM: ABDOMEN - 1 VIEW  COMPARISON:  01/09/2018  FINDINGS: Curvilinear lucency projects along the medial left hemidiaphragm. Gas distended nondilated small bowel and colon. Iliac arterial calcifications. Lower lumbar spondylitic change. Surgical clips over the left hip.  IMPRESSION: 1. Nonobstructive bowel gas pattern. 2. Nonspecific linear  lucency adjacent to the left diaphragmatic leaflet. Recommend horizontal beam radiograph if free intraperitoneal gas is a clinical concern.   Electronically Signed By: Corlis Leak  Hassell M.D. On: 02/17/2022 16:49  Results for orders placed during the hospital encounter of 05/13/05  US VenoUS Imaging Bilateral  Narrative Clinical data: Bilateral lower extremity edema and pain  Bilateral lower extremity venous Doppler ultrasound:  The lower extremity deep venous system demonstrates normal compressibility, phasicity, and augmentation.   No evidence of DVT.  Impression: 1. Negative for   lower extremity DVT  Provider: Vertell NovakErica Reynolds  No results found for this or any previous visit.  No results found for this or any previous visit.  No results found for this or any previous visit.  No valid procedures specified. No results found for this or any previous visit.  No results found for this or any  previous visit.   Assessment & Plan:    1. Urinary retention Continue indwelling foley - INSERT,TEMP INDWELLING BLAD CATH,SIMPLE   No follow-ups on file.  Wilkie Aye, MD  [facility] [place]

## 2023-04-07 NOTE — Progress Notes (Signed)
Cath Change/ Replacement  Patient is present today for a catheter change due to urinary retention.  4ml of water was removed from the balloon, a 22FR foley cath was removed without difficulty.  Patient was cleaned and prepped in a sterile fashion with betadine . A 22 FR foley cath was replaced into the bladder, no complications were noted. Urine return was noted 10 ml and urine was yellow in color. The balloon was filled with 66ml of sterile water. A leg bag was attached for drainage.  A leg bag was also given to the patient and patient was given instruction on how to change from one bag to another. Patient was given proper instruction on catheter care.    Performed by: Kennyth Lose, CMA  Follow up: Keep scheduled nurse visit

## 2023-04-12 DIAGNOSIS — T148XXA Other injury of unspecified body region, initial encounter: Secondary | ICD-10-CM | POA: Diagnosis not present

## 2023-04-12 DIAGNOSIS — E669 Obesity, unspecified: Secondary | ICD-10-CM | POA: Diagnosis not present

## 2023-04-12 DIAGNOSIS — W1839XA Other fall on same level, initial encounter: Secondary | ICD-10-CM | POA: Diagnosis not present

## 2023-04-12 DIAGNOSIS — Z6836 Body mass index (BMI) 36.0-36.9, adult: Secondary | ICD-10-CM | POA: Diagnosis not present

## 2023-04-12 DIAGNOSIS — S81812A Laceration without foreign body, left lower leg, initial encounter: Secondary | ICD-10-CM | POA: Diagnosis not present

## 2023-04-23 DIAGNOSIS — L03116 Cellulitis of left lower limb: Secondary | ICD-10-CM | POA: Diagnosis not present

## 2023-04-23 DIAGNOSIS — E6609 Other obesity due to excess calories: Secondary | ICD-10-CM | POA: Diagnosis not present

## 2023-04-23 DIAGNOSIS — Z0001 Encounter for general adult medical examination with abnormal findings: Secondary | ICD-10-CM | POA: Diagnosis not present

## 2023-04-23 DIAGNOSIS — N1831 Chronic kidney disease, stage 3a: Secondary | ICD-10-CM | POA: Diagnosis not present

## 2023-04-23 DIAGNOSIS — Z125 Encounter for screening for malignant neoplasm of prostate: Secondary | ICD-10-CM | POA: Diagnosis not present

## 2023-04-23 DIAGNOSIS — R609 Edema, unspecified: Secondary | ICD-10-CM | POA: Diagnosis not present

## 2023-04-23 DIAGNOSIS — H353210 Exudative age-related macular degeneration, right eye, stage unspecified: Secondary | ICD-10-CM | POA: Diagnosis not present

## 2023-04-23 DIAGNOSIS — H6123 Impacted cerumen, bilateral: Secondary | ICD-10-CM | POA: Diagnosis not present

## 2023-04-23 DIAGNOSIS — D649 Anemia, unspecified: Secondary | ICD-10-CM | POA: Diagnosis not present

## 2023-04-23 DIAGNOSIS — D518 Other vitamin B12 deficiency anemias: Secondary | ICD-10-CM | POA: Diagnosis not present

## 2023-04-23 DIAGNOSIS — E559 Vitamin D deficiency, unspecified: Secondary | ICD-10-CM | POA: Diagnosis not present

## 2023-04-23 DIAGNOSIS — S81812A Laceration without foreign body, left lower leg, initial encounter: Secondary | ICD-10-CM | POA: Diagnosis not present

## 2023-04-23 DIAGNOSIS — G9332 Myalgic encephalomyelitis/chronic fatigue syndrome: Secondary | ICD-10-CM | POA: Diagnosis not present

## 2023-04-23 DIAGNOSIS — C61 Malignant neoplasm of prostate: Secondary | ICD-10-CM | POA: Diagnosis not present

## 2023-04-23 DIAGNOSIS — Z1331 Encounter for screening for depression: Secondary | ICD-10-CM | POA: Diagnosis not present

## 2023-04-23 DIAGNOSIS — I10 Essential (primary) hypertension: Secondary | ICD-10-CM | POA: Diagnosis not present

## 2023-04-23 DIAGNOSIS — E785 Hyperlipidemia, unspecified: Secondary | ICD-10-CM | POA: Diagnosis not present

## 2023-04-23 DIAGNOSIS — Z6830 Body mass index (BMI) 30.0-30.9, adult: Secondary | ICD-10-CM | POA: Diagnosis not present

## 2023-05-06 ENCOUNTER — Ambulatory Visit (INDEPENDENT_AMBULATORY_CARE_PROVIDER_SITE_OTHER): Payer: Medicare Other

## 2023-05-06 DIAGNOSIS — R339 Retention of urine, unspecified: Secondary | ICD-10-CM | POA: Diagnosis not present

## 2023-05-06 NOTE — Progress Notes (Addendum)
Cath Change/ Replacement  Patient is present today for a catheter change due to urinary retention.  10 ml of water was removed from the balloon, a 22 FR foley cath was removed without difficulty.  Patient was cleaned and prepped in a sterile fashion with betadine and 2% lidocaine jelly was instilled into the urethra. A 22  FR foley cath was replaced into the bladder, no complications were noted. Urine return was noted 5 ml and urine was yellow in color. The balloon was filled with 10ml of sterile water. A leg bag was attached for drainage.  A night bag was also given to the patient and patient was given instruction on how to change from one bag to another. Patient was given proper instruction on catheter care.    Performed by: Kennyth Lose, CMA  Follow up: No follow-ups on file.

## 2023-06-09 ENCOUNTER — Ambulatory Visit: Payer: Medicare Other

## 2023-06-17 ENCOUNTER — Telehealth: Payer: Self-pay

## 2023-06-17 ENCOUNTER — Ambulatory Visit (INDEPENDENT_AMBULATORY_CARE_PROVIDER_SITE_OTHER): Payer: Medicare Other

## 2023-06-17 DIAGNOSIS — R339 Retention of urine, unspecified: Secondary | ICD-10-CM

## 2023-06-17 MED ORDER — AMBULATORY NON FORMULARY MEDICATION: 12 refills | Status: DC

## 2023-06-17 NOTE — Progress Notes (Signed)
Cath Change/ Replacement  Patient is present today for a catheter change due to urinary retention.  10ml of water was removed from the balloon, a 22FR foley cath was removed without difficulty.  Patient was cleaned and prepped in a sterile fashion with betadine. A 22 FR foley cath was replaced into the bladder, no complications were noted. Urine return was noted 10 ml and urine was yellow in color. The balloon was filled with 10ml of sterile water. A leg bag was attached for drainage.  A night bag was also given to the patient and patient was given instruction on how to change from one bag to another. Patient was given proper instruction on catheter care.    Performed by: Kennyth Lose, CMA  Follow up: No follow-ups on file.

## 2023-06-17 NOTE — Telephone Encounter (Signed)
Patient is made aware that a RX for legs is in office for him to pick up at his earliest convince. Patient voiced understanding

## 2023-06-21 DIAGNOSIS — I10 Essential (primary) hypertension: Secondary | ICD-10-CM | POA: Diagnosis not present

## 2023-06-21 DIAGNOSIS — I4891 Unspecified atrial fibrillation: Secondary | ICD-10-CM | POA: Diagnosis not present

## 2023-06-21 DIAGNOSIS — N1831 Chronic kidney disease, stage 3a: Secondary | ICD-10-CM | POA: Diagnosis not present

## 2023-06-21 DIAGNOSIS — E6609 Other obesity due to excess calories: Secondary | ICD-10-CM | POA: Diagnosis not present

## 2023-06-21 DIAGNOSIS — R609 Edema, unspecified: Secondary | ICD-10-CM | POA: Diagnosis not present

## 2023-06-21 DIAGNOSIS — H353210 Exudative age-related macular degeneration, right eye, stage unspecified: Secondary | ICD-10-CM | POA: Diagnosis not present

## 2023-06-21 DIAGNOSIS — Z6835 Body mass index (BMI) 35.0-35.9, adult: Secondary | ICD-10-CM | POA: Diagnosis not present

## 2023-07-01 ENCOUNTER — Inpatient Hospital Stay (HOSPITAL_COMMUNITY)
Admission: EM | Admit: 2023-07-01 | Discharge: 2023-07-09 | DRG: 699 | Disposition: A | Discharge status: Skilled Nursing Facility | Payer: Medicare Other | Attending: Cardiovascular Disease | Admitting: Cardiovascular Disease

## 2023-07-01 ENCOUNTER — Emergency Department (HOSPITAL_COMMUNITY): Payer: Medicare Other

## 2023-07-01 ENCOUNTER — Encounter (HOSPITAL_COMMUNITY): Payer: Self-pay

## 2023-07-01 ENCOUNTER — Other Ambulatory Visit: Payer: Self-pay

## 2023-07-01 DIAGNOSIS — R319 Hematuria, unspecified: Secondary | ICD-10-CM | POA: Diagnosis present

## 2023-07-01 DIAGNOSIS — R5383 Other fatigue: Secondary | ICD-10-CM | POA: Diagnosis present

## 2023-07-01 DIAGNOSIS — I083 Combined rheumatic disorders of mitral, aortic and tricuspid valves: Secondary | ICD-10-CM | POA: Diagnosis present

## 2023-07-01 DIAGNOSIS — N179 Acute kidney failure, unspecified: Secondary | ICD-10-CM | POA: Diagnosis present

## 2023-07-01 DIAGNOSIS — M19041 Primary osteoarthritis, right hand: Secondary | ICD-10-CM | POA: Diagnosis present

## 2023-07-01 DIAGNOSIS — L03115 Cellulitis of right lower limb: Secondary | ICD-10-CM | POA: Diagnosis present

## 2023-07-01 DIAGNOSIS — I422 Other hypertrophic cardiomyopathy: Secondary | ICD-10-CM | POA: Diagnosis not present

## 2023-07-01 DIAGNOSIS — Z7401 Bed confinement status: Secondary | ICD-10-CM | POA: Diagnosis not present

## 2023-07-01 DIAGNOSIS — S2249XA Multiple fractures of ribs, unspecified side, initial encounter for closed fracture: Secondary | ICD-10-CM | POA: Diagnosis not present

## 2023-07-01 DIAGNOSIS — Z79899 Other long term (current) drug therapy: Secondary | ICD-10-CM

## 2023-07-01 DIAGNOSIS — I517 Cardiomegaly: Secondary | ICD-10-CM | POA: Diagnosis not present

## 2023-07-01 DIAGNOSIS — T83518A Infection and inflammatory reaction due to other urinary catheter, initial encounter: Principal | ICD-10-CM | POA: Diagnosis present

## 2023-07-01 DIAGNOSIS — N1832 Chronic kidney disease, stage 3b: Secondary | ICD-10-CM | POA: Diagnosis not present

## 2023-07-01 DIAGNOSIS — I739 Peripheral vascular disease, unspecified: Secondary | ICD-10-CM | POA: Diagnosis present

## 2023-07-01 DIAGNOSIS — N390 Urinary tract infection, site not specified: Secondary | ICD-10-CM | POA: Diagnosis present

## 2023-07-01 DIAGNOSIS — E222 Syndrome of inappropriate secretion of antidiuretic hormone: Secondary | ICD-10-CM | POA: Diagnosis present

## 2023-07-01 DIAGNOSIS — K920 Hematemesis: Secondary | ICD-10-CM | POA: Diagnosis present

## 2023-07-01 DIAGNOSIS — I4510 Unspecified right bundle-branch block: Secondary | ICD-10-CM | POA: Diagnosis present

## 2023-07-01 DIAGNOSIS — Z87891 Personal history of nicotine dependence: Secondary | ICD-10-CM

## 2023-07-01 DIAGNOSIS — E785 Hyperlipidemia, unspecified: Secondary | ICD-10-CM | POA: Diagnosis present

## 2023-07-01 DIAGNOSIS — Z8549 Personal history of malignant neoplasm of other male genital organs: Secondary | ICD-10-CM

## 2023-07-01 DIAGNOSIS — I340 Nonrheumatic mitral (valve) insufficiency: Secondary | ICD-10-CM | POA: Diagnosis not present

## 2023-07-01 DIAGNOSIS — E860 Dehydration: Secondary | ICD-10-CM | POA: Diagnosis present

## 2023-07-01 DIAGNOSIS — W19XXXA Unspecified fall, initial encounter: Secondary | ICD-10-CM | POA: Diagnosis present

## 2023-07-01 DIAGNOSIS — M6281 Muscle weakness (generalized): Secondary | ICD-10-CM | POA: Diagnosis not present

## 2023-07-01 DIAGNOSIS — I350 Nonrheumatic aortic (valve) stenosis: Secondary | ICD-10-CM | POA: Diagnosis not present

## 2023-07-01 DIAGNOSIS — E871 Hypo-osmolality and hyponatremia: Secondary | ICD-10-CM

## 2023-07-01 DIAGNOSIS — Z7982 Long term (current) use of aspirin: Secondary | ICD-10-CM

## 2023-07-01 DIAGNOSIS — R2689 Other abnormalities of gait and mobility: Secondary | ICD-10-CM | POA: Diagnosis not present

## 2023-07-01 DIAGNOSIS — I440 Atrioventricular block, first degree: Secondary | ICD-10-CM | POA: Diagnosis present

## 2023-07-01 DIAGNOSIS — I872 Venous insufficiency (chronic) (peripheral): Secondary | ICD-10-CM | POA: Diagnosis present

## 2023-07-01 DIAGNOSIS — R278 Other lack of coordination: Secondary | ICD-10-CM | POA: Diagnosis not present

## 2023-07-01 DIAGNOSIS — I959 Hypotension, unspecified: Secondary | ICD-10-CM | POA: Diagnosis not present

## 2023-07-01 DIAGNOSIS — R61 Generalized hyperhidrosis: Secondary | ICD-10-CM | POA: Diagnosis not present

## 2023-07-01 DIAGNOSIS — Z9049 Acquired absence of other specified parts of digestive tract: Secondary | ICD-10-CM

## 2023-07-01 DIAGNOSIS — N319 Neuromuscular dysfunction of bladder, unspecified: Secondary | ICD-10-CM | POA: Diagnosis present

## 2023-07-01 DIAGNOSIS — I700 Atherosclerosis of aorta: Secondary | ICD-10-CM | POA: Diagnosis not present

## 2023-07-01 DIAGNOSIS — M7989 Other specified soft tissue disorders: Secondary | ICD-10-CM | POA: Diagnosis present

## 2023-07-01 DIAGNOSIS — I2489 Other forms of acute ischemic heart disease: Secondary | ICD-10-CM | POA: Diagnosis present

## 2023-07-01 DIAGNOSIS — E669 Obesity, unspecified: Secondary | ICD-10-CM | POA: Diagnosis not present

## 2023-07-01 DIAGNOSIS — L03116 Cellulitis of left lower limb: Secondary | ICD-10-CM | POA: Diagnosis present

## 2023-07-01 DIAGNOSIS — I249 Acute ischemic heart disease, unspecified: Secondary | ICD-10-CM | POA: Diagnosis not present

## 2023-07-01 DIAGNOSIS — R531 Weakness: Secondary | ICD-10-CM | POA: Diagnosis not present

## 2023-07-01 DIAGNOSIS — I4891 Unspecified atrial fibrillation: Secondary | ICD-10-CM | POA: Diagnosis present

## 2023-07-01 DIAGNOSIS — D631 Anemia in chronic kidney disease: Secondary | ICD-10-CM | POA: Diagnosis present

## 2023-07-01 DIAGNOSIS — D539 Nutritional anemia, unspecified: Secondary | ICD-10-CM | POA: Diagnosis not present

## 2023-07-01 DIAGNOSIS — I2510 Atherosclerotic heart disease of native coronary artery without angina pectoris: Secondary | ICD-10-CM | POA: Diagnosis present

## 2023-07-01 DIAGNOSIS — K922 Gastrointestinal hemorrhage, unspecified: Secondary | ICD-10-CM | POA: Diagnosis not present

## 2023-07-01 DIAGNOSIS — R110 Nausea: Secondary | ICD-10-CM | POA: Diagnosis not present

## 2023-07-01 DIAGNOSIS — Y846 Urinary catheterization as the cause of abnormal reaction of the patient, or of later complication, without mention of misadventure at the time of the procedure: Secondary | ICD-10-CM | POA: Diagnosis present

## 2023-07-01 DIAGNOSIS — E876 Hypokalemia: Secondary | ICD-10-CM | POA: Diagnosis present

## 2023-07-01 DIAGNOSIS — R339 Retention of urine, unspecified: Secondary | ICD-10-CM | POA: Diagnosis not present

## 2023-07-01 DIAGNOSIS — Z6832 Body mass index (BMI) 32.0-32.9, adult: Secondary | ICD-10-CM

## 2023-07-01 DIAGNOSIS — Z8711 Personal history of peptic ulcer disease: Secondary | ICD-10-CM

## 2023-07-01 DIAGNOSIS — Z955 Presence of coronary angioplasty implant and graft: Secondary | ICD-10-CM

## 2023-07-01 DIAGNOSIS — I3481 Nonrheumatic mitral (valve) annulus calcification: Secondary | ICD-10-CM | POA: Diagnosis not present

## 2023-07-01 DIAGNOSIS — Z951 Presence of aortocoronary bypass graft: Secondary | ICD-10-CM

## 2023-07-01 LAB — APTT: aPTT: 28 seconds (ref 24–36)

## 2023-07-01 LAB — COMPREHENSIVE METABOLIC PANEL
ALT: 11 U/L (ref 0–44)
AST: 37 U/L (ref 15–41)
Albumin: 3.9 g/dL (ref 3.5–5.0)
Alkaline Phosphatase: 49 U/L (ref 38–126)
Anion gap: 18 — ABNORMAL HIGH (ref 5–15)
BUN: 39 mg/dL — ABNORMAL HIGH (ref 8–23)
CO2: 20 mmol/L — ABNORMAL LOW (ref 22–32)
Calcium: 8.6 mg/dL — ABNORMAL LOW (ref 8.9–10.3)
Chloride: 83 mmol/L — ABNORMAL LOW (ref 98–111)
Creatinine, Ser: 2.11 mg/dL — ABNORMAL HIGH (ref 0.61–1.24)
GFR, Estimated: 28 mL/min — ABNORMAL LOW (ref >60)
Glucose, Bld: 144 mg/dL — ABNORMAL HIGH (ref 70–99)
Potassium: 3.6 mmol/L (ref 3.5–5.1)
Sodium: 121 mmol/L — ABNORMAL LOW (ref 135–145)
Total Bilirubin: 0.9 mg/dL (ref 0.3–1.2)
Total Protein: 7.3 g/dL (ref 6.5–8.1)

## 2023-07-01 LAB — CBC WITH DIFFERENTIAL/PLATELET
Abs Immature Granulocytes: 0.02 10*3/uL (ref 0.00–0.07)
Basophils Absolute: 0 10*3/uL (ref 0.0–0.1)
Basophils Relative: 0 %
Eosinophils Absolute: 0 10*3/uL (ref 0.0–0.5)
Eosinophils Relative: 0 %
HCT: 33.1 % — ABNORMAL LOW (ref 39.0–52.0)
Hemoglobin: 11.5 g/dL — ABNORMAL LOW (ref 13.0–17.0)
Immature Granulocytes: 0 %
Lymphocytes Relative: 6 %
Lymphs Abs: 0.4 10*3/uL — ABNORMAL LOW (ref 0.7–4.0)
MCH: 32.2 pg (ref 26.0–34.0)
MCHC: 34.7 g/dL (ref 30.0–36.0)
MCV: 92.7 fL (ref 80.0–100.0)
Monocytes Absolute: 0.6 10*3/uL (ref 0.1–1.0)
Monocytes Relative: 8 %
Neutro Abs: 6.6 10*3/uL (ref 1.7–7.7)
Neutrophils Relative %: 86 %
Platelets: 206 10*3/uL (ref 150–400)
RBC: 3.57 MIL/uL — ABNORMAL LOW (ref 4.22–5.81)
RDW: 15 % (ref 11.5–15.5)
WBC: 7.6 10*3/uL (ref 4.0–10.5)
nRBC: 0 % (ref 0.0–0.2)

## 2023-07-01 LAB — URINALYSIS, ROUTINE W REFLEX MICROSCOPIC
Bilirubin Urine: NEGATIVE
Glucose, UA: NEGATIVE mg/dL
Ketones, ur: NEGATIVE mg/dL
Nitrite: POSITIVE — AB
Protein, ur: NEGATIVE mg/dL
Specific Gravity, Urine: 1.01 (ref 1.005–1.030)
pH: 7 (ref 5.0–8.0)

## 2023-07-01 LAB — LIPASE, BLOOD: Lipase: 43 U/L (ref 11–51)

## 2023-07-01 LAB — TROPONIN I (HIGH SENSITIVITY)
Troponin I (High Sensitivity): 635 ng/L (ref <18)
Troponin I (High Sensitivity): 573 ng/L (ref <18)

## 2023-07-01 LAB — PROTIME-INR
INR: 1.1 (ref 0.8–1.2)
Prothrombin Time: 14.2 seconds (ref 11.4–15.2)

## 2023-07-01 MED ORDER — SODIUM CHLORIDE 0.9 % IV SOLN: INTRAVENOUS | Status: DC

## 2023-07-01 MED ORDER — HEPARIN BOLUS VIA INFUSION
4000.0000 [IU] | Freq: Once | INTRAVENOUS | Status: AC
  Administered 2023-07-01: 4000 [IU] via INTRAVENOUS

## 2023-07-01 MED ORDER — HEPARIN (PORCINE) 25000 UT/250ML-% IV SOLN
1500.0000 [IU]/h | INTRAVENOUS | Status: DC
  Administered 2023-07-01 – 2023-07-03 (×4): 1500 [IU]/h via INTRAVENOUS
  Filled 2023-07-01 (×4): qty 250

## 2023-07-01 MED ORDER — SODIUM CHLORIDE 0.9 % IV BOLUS
500.0000 mL | Freq: Once | INTRAVENOUS | Status: AC
  Administered 2023-07-01: 500 mL via INTRAVENOUS

## 2023-07-01 MED ORDER — SODIUM CHLORIDE 0.9 % IV SOLN
1.0000 g | Freq: Once | INTRAVENOUS | Status: AC
  Administered 2023-07-01: 1 g via INTRAVENOUS
  Filled 2023-07-01: qty 10

## 2023-07-01 MED ORDER — ASPIRIN 81 MG PO CHEW
324.0000 mg | CHEWABLE_TABLET | Freq: Once | ORAL | Status: AC
  Administered 2023-07-01: 324 mg via ORAL
  Filled 2023-07-01: qty 4

## 2023-07-01 NOTE — H&P (Signed)
Referring Physician: Burgess Amor, PA-C  Caleb [name] is an 89 y.o. male.                       Chief Complaint: Weakness  HPI: 89 years old white male with PMH of CAD, HLD, PVD, Venous stasis dermatitis and obesity had 2 day h/o weakness and fall x 2. He was found on his bedroom floor by his live-in caregiver. He was found to be clammy and had vomited x 2 per EMS during transport.  His Troponin I levels are elevated to 635 ng and 573 ng.  His sodium is very low at 121 mmol and creatinine is elevated at 2.11 mg/dL. EKG: NSR, RBBB. CXR: Cardiomegaly.  Past Medical History:  Diagnosis Date   Coronary artery disease    Hyperlipidemia    Obesity    Penile cancer (HCC)    Swelling of extremity    Left Leg      Past Surgical History:  Procedure Laterality Date   APPENDECTOMY     BACK SURGERY     BIOPSY  07/22/2021   Procedure: BIOPSY;  Surgeon: Lynann Bologna, MD;  Location: [facility] ENDOSCOPY;  Service: Gastroenterology;;   CHOLECYSTECTOMY     CORONARY ANGIOPLASTY  11/2005   RCA PCI AND STENTING WITH A CYPHER DES   CORONARY ARTERY BYPASS GRAFT     ESOPHAGOGASTRODUODENOSCOPY (EGD) WITH PROPOFOL N/A 07/22/2021   Procedure: ESOPHAGOGASTRODUODENOSCOPY (EGD) WITH PROPOFOL;  Surgeon: Lynann Bologna, MD;  Location: [facility] ENDOSCOPY;  Service: Gastroenterology;  Laterality: N/A;   HIATAL HERNIA REPAIR     lymph node removal     NM MYOCAR MULTIPLE W/SPECT  11/26/2009   EF 62%. NORMAL MYOCARDIAL PERFUSION STUDY.   TRANSTHORACIC ECHOCARDIOGRAM  12/22/2005   MILD TO MOD AORTIC SCLEROSIS W/O STENOSIS. MILD TO MODERATE MITRAL CALCIFICATION. LA- MILDLY DILATED.    Family History  Problem Relation Age of Onset   Cancer Father    Cancer Sister    Cancer Brother    Cancer Sister    Cancer Other    Social History:  reports that he quit smoking about 39 years ago. His smoking use included cigarettes. He has never used smokeless tobacco. He reports that he does not drink alcohol and does not use  drugs.  Allergies: No Known Allergies  Medications Prior to Admission  Medication Sig Dispense Refill   acetaminophen (TYLENOL) 325 MG tablet Take 2 tablets (650 mg total) by mouth every 6 (six) hours as needed for mild pain (or Fever >/= 101).     AMBULATORY NON FORMULARY MEDICATION Change drainage bags as needed. 5 Bag 11   AMBULATORY NON FORMULARY MEDICATION Medication Name: Catheter leg bags 2 Bag 12   aspirin EC 81 MG tablet Take 1 tablet (81 mg total) by mouth daily. 30 tablet 0   benzonatate (TESSALON) 100 MG capsule Take 1 capsule (100 mg total) by mouth 3 (three) times daily as needed for cough. 21 capsule 0   Calcium Carbonate-Vitamin D (CALCIUM-D PO) Take 1 tablet by mouth daily.     doxycycline (VIBRAMYCIN) 100 MG capsule Take 1 capsule (100 mg total) by mouth 2 (two) times daily. One po bid x 7 days 14 capsule 0   furosemide (LASIX) 40 MG tablet Take 40 mg by mouth daily.     lactulose (CHRONULAC) 10 GM/15ML solution Take 15 mLs (10 g total) by mouth 2 (two) times daily as needed for mild constipation. 236 mL 0   metoprolol  tartrate (LOPRESSOR) 25 MG tablet Take 0.5 tablets (12.5 mg total) by mouth 2 (two) times daily. 60 tablet 0   Multiple Vitamin (MULTIVITAMIN WITH MINERALS) TABS tablet Take 1 tablet by mouth daily. Centrum Silver     Multiple Vitamins-Minerals (PRESERVISION AREDS 2) CAPS Take by mouth.     pantoprazole (PROTONIX) 40 MG tablet Take 1 tablet (40 mg total) by mouth daily. Give one tablet by mouth twice daily for Upper GI Bleed x 8wks 30 tablet 0   polyethylene glycol powder (GLYCOLAX/MIRALAX) 17 GM/SCOOP powder Take 17 g by mouth daily. 255 g 1   simvastatin (ZOCOR) 20 MG tablet Take 1 tablet (20 mg total) by mouth at bedtime. 30 tablet 0    Results for orders placed or performed during the hospital encounter of 07/01/23 (from the past 48 hour(s))  CBC with Differential/Platelet     Status: Abnormal   Collection Time: 07/01/23  6:53 PM  Result Value Ref Range    WBC 7.6 4.0 - 10.5 K/uL   RBC 3.57 (L) 4.22 - 5.81 MIL/uL   Hemoglobin 11.5 (L) 13.0 - 17.0 g/dL   HCT 28.4 (L) 13.2 - 44.0 %   MCV 92.7 80.0 - 100.0 fL   MCH 32.2 26.0 - 34.0 pg   MCHC 34.7 30.0 - 36.0 g/dL   RDW 10.2 72.5 - 36.6 %   Platelets 206 150 - 400 K/uL   nRBC 0.0 0.0 - 0.2 %   Neutrophils Relative % 86 %   Neutro Abs 6.6 1.7 - 7.7 K/uL   Lymphocytes Relative 6 %   Lymphs Abs 0.4 (L) 0.7 - 4.0 K/uL   Monocytes Relative 8 %   Monocytes Absolute 0.6 0.1 - 1.0 K/uL   Eosinophils Relative 0 %   Eosinophils Absolute 0.0 0.0 - 0.5 K/uL   Basophils Relative 0 %   Basophils Absolute 0.0 0.0 - 0.1 K/uL   Immature Granulocytes 0 %   Abs Immature Granulocytes 0.02 0.00 - 0.07 K/uL    Comment: Performed at [facility], [address], [place], [place] [address]  Protime-INR     Status: None   Collection Time: 07/01/23  6:53 PM  Result Value Ref Range   Prothrombin Time 14.2 11.4 - 15.2 seconds   INR 1.1 0.8 - 1.2    Comment: (NOTE) INR goal varies based on device and disease states. Performed at [facility], [address] [place], [place] [address]   APTT     Status: None   Collection Time: 07/01/23  6:53 PM  Result Value Ref Range   aPTT 28 24 - 36 seconds    Comment: Performed at [facility], [address], [place], [place] [address]  Comprehensive metabolic panel     Status: Abnormal   Collection Time: 07/01/23  6:53 PM  Result Value Ref Range   Sodium 121 (L) 135 - 145 mmol/L   Potassium 3.6 3.5 - 5.1 mmol/L    Comment: HEMOLYSIS AT THIS LEVEL MAY AFFECT RESULT HEMOLYSIS AT THIS LEVEL MAY AFFECT RESULT    Chloride 83 (L) 98 - 111 mmol/L   CO2 20 (L) 22 - 32 mmol/L   Glucose, Bld 144 (H) 70 - 99 mg/dL    Comment: Glucose reference range applies only to samples taken after fasting for at least 8 hours.   BUN 39 (H) 8 - 23 mg/dL   Creatinine, Ser 5.64 (H) 0.61 - 1.24 mg/dL   Calcium 8.6 (L) 8.9 - 10.3 mg/dL  Total Protein 7.3 6.5 - 8.1 g/dL   Albumin 3.9 3.5  - 5.0 g/dL   AST 37 15 - 41 U/L    Comment: HEMOLYSIS AT THIS LEVEL MAY AFFECT RESULT HEMOLYSIS AT THIS LEVEL MAY AFFECT RESULT    ALT 11 0 - 44 U/L    Comment: HEMOLYSIS AT THIS LEVEL MAY AFFECT RESULT HEMOLYSIS AT THIS LEVEL MAY AFFECT RESULT    Alkaline Phosphatase 49 38 - 126 U/L   Total Bilirubin 0.9 0.3 - 1.2 mg/dL    Comment: HEMOLYSIS AT THIS LEVEL MAY AFFECT RESULT HEMOLYSIS AT THIS LEVEL MAY AFFECT RESULT    GFR, Estimated 28 (L) >60 mL/min    Comment: (NOTE) Calculated using the CKD-EPI Creatinine Equation (2021)    Anion gap 18 (H) 5 - 15    Comment: Performed at [facility], [address] [place], [place] [address]  Troponin I (High Sensitivity)     Status: Abnormal   Collection Time: 07/01/23  6:53 PM  Result Value Ref Range   Troponin I (High Sensitivity) 635 (HH) <18 ng/L    Comment: CRITICAL RESULT CALLED TO, READ BACK BY AND VERIFIED WITH C EANES AT 1950 ON [id number] BY S DALTON (NOTE) Elevated high sensitivity troponin I (hsTnI) values and significant  changes across serial measurements may suggest ACS but many other  chronic and acute conditions are known to elevate hsTnI results.  Refer to the Links section for chest pain algorithms and additional  guidance. Performed at [facility], [address] [place], [place] [address]   Lipase, blood     Status: None   Collection Time: 07/01/23  6:53 PM  Result Value Ref Range   Lipase 43 11 - 51 U/L    Comment: Performed at [facility], [address] [place], [place] [address]  Urinalysis, Routine w reflex microscopic -Urine, Catheterized; Indwelling urinary catheter     Status: Abnormal   Collection Time: 07/01/23  7:38 PM  Result Value Ref Range   Color, Urine YELLOW YELLOW   APPearance CLOUDY (A) CLEAR   Specific Gravity, Urine 1.010 1.005 - 1.030   pH 7.0 5.0 - 8.0   Glucose, UA NEGATIVE NEGATIVE mg/dL   Hgb urine dipstick SMALL (A) NEGATIVE   Bilirubin Urine NEGATIVE NEGATIVE   Ketones, ur NEGATIVE  NEGATIVE mg/dL   Protein, ur NEGATIVE NEGATIVE mg/dL   Nitrite POSITIVE (A) NEGATIVE   Leukocytes,Ua LARGE (A) NEGATIVE   RBC / HPF 0-5 0 - 5 RBC/hpf   WBC, UA 21-50 0 - 5 WBC/hpf   Bacteria, UA MANY (A) NONE SEEN   Squamous Epithelial / HPF 0-5 0 - 5 /HPF   Mucus PRESENT    Hyaline Casts, UA PRESENT     Comment: Performed at [facility] [place] [facility], [address], [place], [place] [address]  Troponin I (High Sensitivity)     Status: Abnormal   Collection Time: 07/01/23  9:00 PM  Result Value Ref Range   Troponin I (High Sensitivity) 573 (HH) <18 ng/L    Comment: CRITICAL VALUE NOTED. VALUE IS CONSISTENT WITH PREVIOUSLY REPORTED/CALLED VALUE DELTA CHECK NOTED (NOTE) Elevated high sensitivity troponin I (hsTnI) values and significant  changes across serial measurements may suggest ACS but many other  chronic and acute conditions are known to elevate hsTnI results.  Refer to the "Links" section for chest pain algorithms and additional  guidance. Performed at [facility], [address] [place], [place] [address]    DG Chest Portable 1 View  Result Date: 07/01/2023 CLINICAL DATA:  Weakness EXAM: PORTABLE CHEST 1 VIEW COMPARISON:  Chest radiograph 05/25/2022. FINDINGS: The heart is enlarged, unchanged. The upper mediastinal contours are stable. Dense mitral annular calcifications are noted. Mildly coarsened interstitial markings are similar to the prior study, chronic. There is no focal consolidation or pulmonary edema. There is no pleural effusion or pneumothorax There is no acute osseous abnormality. Remote left first and second rib fractures are noted. IMPRESSION: 1. No radiographic evidence of acute cardiopulmonary process. 2. Cardiomegaly and mitral annular calcifications. Electronically Signed   By: Lesia Hausen M.D.   On: 07/01/2023 19:30    Review Of Systems Constitutional: No fever, chills, Chronic weight gain. Eyes: No vision change, wears glasses. No discharge or pain. Ears: Postive  hearing loss, No tinnitus. Respiratory: No asthma, COPD, pneumonias. No shortness of breath. No hemoptysis. Cardiovascular: No chest pain, palpitation, leg edema. Gastrointestinal: No nausea, vomiting, diarrhea, constipation. No GI bleed. No hepatitis. Genitourinary: No dysuria, hematuria, kidney stone. No incontinance. Neurological: No headache, stroke, seizures.  Psychiatry: No psych facility admission for anxiety, depression, suicide. No detox. Skin: Bilateral lower extremities venous stasis dermatitis. Musculoskeletal: Positive joint pain, fibromyalgia. No neck pain, back pain. Lymphadenopathy: No lymphadenopathy. Hematology: No anemia or easy bruising.   Blood pressure 126/66, pulse 84, temperature 98.3 F (36.8 C), temperature source Oral, resp. rate 18, height 6\' 2"  (1.88 m), weight 113.4 kg, SpO2 95 %. Body mass index is 32.1 kg/m. General appearance: alert, cooperative, appears stated age and no distress Head: Normocephalic, atraumatic. Eyes: Blue eyes, pink conjunctiva, corneas clear.  Neck: No adenopathy, no carotid bruit, no JVD, supple, symmetrical, trachea midline and thyroid not enlarged. Resp: Clear to auscultation bilaterally. Cardio: Regular rate and rhythm, S1, S2 normal, II/VI systolic murmur, no click, rub or gallop GI: Soft, non-tender; bowel sounds normal; no organomegaly. Extremities: 1 + edema, no cyanosis or clubbing. Venous stasis dermatitis of both lower legs. Skin: Warm and dry.  Neurologic: Alert and oriented X 3, normal strength. Normal coordination and gait.  Assessment/Plan Dehydration Abnormal troponin I from demand ischemia Acute renal failure Severe hyponatremia CAD PVD Obesity  Plan: Admit. IV saline. Echocardiogram. Encourage medical treatment due to advanced age.  Time spent: Review of old records, Lab, x-rays, EKG, other cardiac tests, examination, discussion with patient over 70 minutes.  Ricki Rodriguez, MD  07/01/2023, 10:34  PM

## 2023-07-01 NOTE — ED Triage Notes (Signed)
89yoM coming from home Weakness and fallsx2 days Found on bedroom floor clammy and vomiting for EMS NEW RBBB and AFIB SOB on exertion  Unsteady gait, uses [employer] at home  118/55 81 HR  EMS got IV access  Pt denies CP or Sob at this time Denies ABD pain Denies HA, dizziness and blurred vision  12 lead completed and given to MD

## 2023-07-01 NOTE — ED Notes (Signed)
Pt does have a cath with leg bag Pt has hx of penile CA Pt stated he had to have his penis removed Pt does see urology every month for remove and replace

## 2023-07-01 NOTE — ED Provider Notes (Addendum)
[place] EMERGENCY DEPARTMENT AT [facility] Provider Note   CSN: [id number] Arrival date & time: 07/01/23  1826     History  Chief Complaint  Patient presents with   Weakness    Caleb [name] is a 89 y.o. male with a history including hyperlipidemia, CAD, peripheral edema associated with peripheral vascular disease, history of penile cancer, uses a indwelling Foley catheter secondary to neurogenic bladder, chronic kidney disease and history of atrial fibrillation, also history of duodenal ulcers with hematemesis requiring transfusions presenting for evaluation of a 2-day history of generalized fatigue and weakness.  He endorses falling both yesterday and today with ambulation.  He states that he is very sedentary and is just weak from not getting enough exercise at the source of his fall.  He denies dizziness, chest pain, abdominal pain, also denies headache or injury sustained with falls.  He was found on his bedroom floor by his live-in caregiver.  He was found to be clammy and had vomited x 2 per EMS during transport.  The history is provided by the patient.       Home Medications Prior to Admission medications   Medication Sig Start Date End Date Taking? Authorizing Provider  acetaminophen (TYLENOL) 325 MG tablet Take 2 tablets (650 mg total) by mouth every 6 (six) hours as needed for mild pain (or Fever >/= 101). 07/28/21   Elgergawy, Leana Roe, MD  AMBULATORY NON FORMULARY MEDICATION Change drainage bags as needed. 02/16/23   McKenzie, Mardene Celeste, MD  AMBULATORY NON FORMULARY MEDICATION Medication Name: Catheter leg bags 06/17/23   McKenzie, Mardene Celeste, MD  aspirin EC 81 MG tablet Take 1 tablet (81 mg total) by mouth daily. 09/23/21   Medina-Vargas, Monina C, NP  benzonatate (TESSALON) 100 MG capsule Take 1 capsule (100 mg total) by mouth 3 (three) times daily as needed for cough. 05/25/22   Dartha Lodge, PA-C  Calcium Carbonate-Vitamin D (CALCIUM-D PO) Take 1 tablet by  mouth daily.    [provider]  doxycycline (VIBRAMYCIN) 100 MG capsule Take 1 capsule (100 mg total) by mouth 2 (two) times daily. One po bid x 7 days 05/25/22   Dartha Lodge, PA-C  furosemide (LASIX) 40 MG tablet Take 40 mg by mouth daily. 03/17/22   [provider]  lactulose (CHRONULAC) 10 GM/15ML solution Take 15 mLs (10 g total) by mouth 2 (two) times daily as needed for mild constipation. 09/23/21   Medina-Vargas, Monina C, NP  metoprolol tartrate (LOPRESSOR) 25 MG tablet Take 0.5 tablets (12.5 mg total) by mouth 2 (two) times daily. 09/23/21   Medina-Vargas, Monina C, NP  Multiple Vitamin (MULTIVITAMIN WITH MINERALS) TABS tablet Take 1 tablet by mouth daily. Centrum Silver    [provider]  Multiple Vitamins-Minerals (PRESERVISION AREDS 2) CAPS Take by mouth.    [provider]  pantoprazole (PROTONIX) 40 MG tablet Take 1 tablet (40 mg total) by mouth daily. Give one tablet by mouth twice daily for Upper GI Bleed x 8wks 09/23/21   Medina-Vargas, Monina C, NP  polyethylene glycol powder (GLYCOLAX/MIRALAX) 17 GM/SCOOP powder Take 17 g by mouth daily. 09/23/21   Medina-Vargas, Monina C, NP  simvastatin (ZOCOR) 20 MG tablet Take 1 tablet (20 mg total) by mouth at bedtime. 09/23/21   Medina-Vargas, Monina C, NP      Allergies    Patient has no known allergies.    Review of Systems   Review of Systems  Constitutional:  Positive for  diaphoresis. Negative for fever.  HENT:  Negative for congestion and sore throat.   Eyes: Negative.   Respiratory:  Negative for chest tightness and shortness of breath.   Cardiovascular:  Negative for chest pain.  Gastrointestinal:  Positive for nausea and vomiting. Negative for abdominal pain.  Genitourinary: Negative.   Musculoskeletal:  Negative for arthralgias, joint swelling and neck pain.  Skin: Negative.  Negative for rash and wound.  Neurological:  Positive for weakness. Negative for dizziness, light-headedness,  numbness and headaches.  Psychiatric/Behavioral: Negative.      Physical Exam Updated Vital Signs BP 110/65 (BP Location: Left Arm)   Pulse 69   Temp 97.9 F (36.6 C) (Oral)   Resp 17   Ht 6\' 2"  (1.88 m)   Wt 113.4 kg   SpO2 92%   BMI 32.10 kg/m  Physical Exam Vitals and nursing note reviewed.  Constitutional:      Appearance: He is well-developed.  HENT:     Head: Normocephalic and atraumatic.  Eyes:     Conjunctiva/sclera: Conjunctivae normal.  Cardiovascular:     Rate and Rhythm: Normal rate and regular rhythm.     Heart sounds: Murmur heard.  Pulmonary:     Effort: Pulmonary effort is normal.     Breath sounds: Normal breath sounds. No wheezing.  Abdominal:     General: Bowel sounds are normal.     Palpations: Abdomen is soft.     Tenderness: There is no abdominal tenderness.  Musculoskeletal:        General: Normal range of motion.     Cervical back: Normal range of motion.     Right lower leg: Edema present.     Left lower leg: Edema present.     Comments: Rle edema > Lle,  pt reports chronic  Skin:    General: Skin is warm and dry.  Neurological:     Mental Status: He is alert.     ED Results / Procedures / Treatments   Labs (all labs ordered are listed, but only abnormal results are displayed) Labs Reviewed  CBC WITH DIFFERENTIAL/PLATELET - Abnormal; Notable for the following components:      Result Value   RBC 3.57 (*)    Hemoglobin 11.5 (*)    HCT 33.1 (*)    Lymphs Abs 0.4 (*)    All other components within normal limits  COMPREHENSIVE METABOLIC PANEL - Abnormal; Notable for the following components:   Sodium 121 (*)    Chloride 83 (*)    CO2 20 (*)    Glucose, Bld 144 (*)    BUN 39 (*)    Creatinine, Ser 2.11 (*)    Calcium 8.6 (*)    GFR, Estimated 28 (*)    Anion gap 18 (*)    All other components within normal limits  URINALYSIS, ROUTINE W REFLEX MICROSCOPIC - Abnormal; Notable for the following components:   APPearance CLOUDY (*)     Hgb urine dipstick SMALL (*)    Nitrite POSITIVE (*)    Leukocytes,Ua LARGE (*)    Bacteria, UA MANY (*)    All other components within normal limits  TROPONIN I (HIGH SENSITIVITY) - Abnormal; Notable for the following components:   Troponin I (High Sensitivity) 635 (*)    All other components within normal limits  PROTIME-INR  APTT  LIPASE, BLOOD  HEPARIN LEVEL (UNFRACTIONATED)  CBC  TROPONIN I (HIGH SENSITIVITY)    EKG EKG Interpretation Date/Time:  Thursday July 01 2023 18:40:51  EDT Ventricular Rate:  83 PR Interval:  45 QRS Duration:  169 QT Interval:  434 QTC Calculation: 510 R Axis:   -37  Text Interpretation: Sinus rhythm Atrial premature complex Short PR interval Right bundle branch block Confirmed by Bethann Berkshire [phone]) on 07/01/2023 8:00:12 PM  Radiology DG Chest Portable 1 View  Result Date: 07/01/2023 CLINICAL DATA:  Weakness EXAM: PORTABLE CHEST 1 VIEW COMPARISON:  Chest radiograph 05/25/2022. FINDINGS: The heart is enlarged, unchanged. The upper mediastinal contours are stable. Dense mitral annular calcifications are noted. Mildly coarsened interstitial markings are similar to the prior study, chronic. There is no focal consolidation or pulmonary edema. There is no pleural effusion or pneumothorax There is no acute osseous abnormality. Remote left first and second rib fractures are noted. IMPRESSION: 1. No radiographic evidence of acute cardiopulmonary process. 2. Cardiomegaly and mitral annular calcifications. Electronically Signed   By: Lesia Hausen M.D.   On: 07/01/2023 19:30    Procedures Procedures    Medications Ordered in ED Medications  heparin ADULT infusion 100 units/mL (25000 units/272mL) (1,500 Units/hr Intravenous New Bag/Given 07/01/23 2042)  cefTRIAXone (ROCEPHIN) 1 g in sodium chloride 0.9 % 100 mL IVPB (has no administration in time range)  sodium chloride 0.9 % bolus 500 mL (0 mLs Intravenous Stopped 07/01/23 2015)  aspirin chewable tablet 324  mg (324 mg Oral Given 07/01/23 2029)  sodium chloride 0.9 % bolus 500 mL (500 mLs Intravenous New Bag/Given 07/01/23 2029)  heparin bolus via infusion 4,000 Units (4,000 Units Intravenous Bolus from Bag 07/01/23 2043)    ED Course/ Medical Decision Making/ A&P                             Medical Decision Making Patient presenting with a 2-day history of weakness, 2 falls in that period of time, when EMS arrived today he had nausea with emesis and diaphoresis.  He denies chest pain, shortness of breath or any focal complaints, just states he feels generally weak.  Differential diagnosis including dehydration, electrolyte abnormality.  He does have significant cardiac history, this could represent ACS, pneumonia, UTI or other infectious process.  Amount and/or Complexity of Data Reviewed Labs: ordered.    Details: Current resulted labs are significant for an elevated troponin at 635.  He also has a sodium of 121, renal insufficiency with some dehydration with a BUN of 39 and a creatinine of 2.11. Radiology: ordered. Discussion of management or test interpretation with external provider(s): Discussed with Dr. Algie Coffer of cardiology who accepts patient for admission, he will be transferred to [facility] telemetry.  Dr. Algie Coffer has asked for heparin bolus and drip and aspirin, both have been ordered.  It is noted also that he has a significant hyponatremia.  He is currently receiving gentle normal saline per IV.  Risk OTC drugs. Prescription drug management. Decision regarding hospitalization.    CRITICAL CARE Performed by: Burgess Amor Total critical care time: 45 minutes Critical care time was exclusive of separately billable procedures and treating other patients. Critical care was necessary to treat or prevent imminent or life-threatening deterioration. Critical care was time spent personally by me on the following activities: development of treatment plan with patient and/or surrogate as well as  nursing, discussions with consultants, evaluation of patient's response to treatment, examination of patient, obtaining history from patient or surrogate, ordering and performing treatments and interventions, ordering and review of laboratory studies, ordering and review of radiographic studies, pulse oximetry  and re-evaluation of patient's condition.        Final Clinical Impression(s) / ED Diagnoses Final diagnoses:  ACS (acute coronary syndrome) (HCC)  Hyponatremia  Urinary tract infection with hematuria, site unspecified    Rx / DC Orders ED Discharge Orders     None         Victoriano Lain 07/01/23 2017    Burgess Amor, PA-C 07/01/23 2045    Bethann Berkshire, MD 07/02/23 1320

## 2023-07-01 NOTE — Progress Notes (Signed)
ANTICOAGULATION CONSULT NOTE - Initial Consult  Pharmacy Consult for Heparin Indication: chest pain/ACS  No Known Allergies  Patient Measurements: Height: 6\' 2"  (188 cm) Weight: 113.4 kg (250 lb) IBW/kg (Calculated) : 82.2 Heparin Dosing Weight: 106 kg  Vital Signs: Temp: 97.9 F (36.6 C) (07/04 1834) Temp Source: Oral (07/04 1834) BP: 110/65 (07/04 2000) Pulse Rate: 69 (07/04 2000)  Labs: Recent Labs    07/01/23 1853  HGB 11.5*  HCT 33.1*  PLT 206  APTT 28  LABPROT 14.2  INR 1.1  CREATININE 2.11*  TROPONINIHS 635*    Estimated Creatinine Clearance: 27.4 mL/min (A) (by C-G formula based on SCr of 2.11 mg/dL (H)).   Medical History: Past Medical History:  Diagnosis Date   Coronary artery disease    Hyperlipidemia    Obesity    Penile cancer (HCC)    Swelling of extremity    Left Leg    Medications:  Home med rec pending  Assessment: 89 y.o. M presents with weakness and fall. Found to have troponin 635. To begin heparin for r/o ACS. No AC PTA. Hgb 11.5, plt wnl.  Goal of Therapy:  Heparin level 0.3-0.7 units/ml Monitor platelets by anticoagulation protocol: Yes   Plan:  Heparin IV bolus 4000 Heparin gtt at 1500 units/hr Will f/u heparin level in 8 hours Daily heparin level and CBC  Christoper Fabian, PharmD, BCPS Please see amion for complete clinical pharmacist phone list 07/01/2023,8:26 PM

## 2023-07-02 ENCOUNTER — Inpatient Hospital Stay (HOSPITAL_COMMUNITY): Payer: Medicare Other

## 2023-07-02 DIAGNOSIS — M7989 Other specified soft tissue disorders: Secondary | ICD-10-CM

## 2023-07-02 LAB — ECHOCARDIOGRAM COMPLETE
AR max vel: 1.65 cm2
AV Area VTI: 1.77 cm2
AV Area mean vel: 1.65 cm2
AV Mean grad: 13 mmHg
AV Peak grad: 25 mmHg
Ao pk vel: 2.5 m/s
Area-P 1/2: 4.15 cm2
Height: 70 in
S' Lateral: 3.3 cm
Weight: 4176.39 oz

## 2023-07-02 LAB — BASIC METABOLIC PANEL
Anion gap: 13 (ref 5–15)
BUN: 36 mg/dL — ABNORMAL HIGH (ref 8–23)
CO2: 25 mmol/L (ref 22–32)
Calcium: 8.7 mg/dL — ABNORMAL LOW (ref 8.9–10.3)
Chloride: 85 mmol/L — ABNORMAL LOW (ref 98–111)
Creatinine, Ser: 1.77 mg/dL — ABNORMAL HIGH (ref 0.61–1.24)
GFR, Estimated: 35 mL/min — ABNORMAL LOW (ref >60)
Glucose, Bld: 93 mg/dL (ref 70–99)
Potassium: 2.8 mmol/L — ABNORMAL LOW (ref 3.5–5.1)
Sodium: 123 mmol/L — ABNORMAL LOW (ref 135–145)

## 2023-07-02 LAB — CBC
HCT: 29.9 % — ABNORMAL LOW (ref 39.0–52.0)
Hemoglobin: 10.5 g/dL — ABNORMAL LOW (ref 13.0–17.0)
MCH: 32.5 pg (ref 26.0–34.0)
MCHC: 35.1 g/dL (ref 30.0–36.0)
MCV: 92.6 fL (ref 80.0–100.0)
Platelets: 190 10*3/uL (ref 150–400)
RBC: 3.23 MIL/uL — ABNORMAL LOW (ref 4.22–5.81)
RDW: 15.1 % (ref 11.5–15.5)
WBC: 7.9 10*3/uL (ref 4.0–10.5)
nRBC: 0 % (ref 0.0–0.2)

## 2023-07-02 LAB — HEPARIN LEVEL (UNFRACTIONATED)
Heparin Unfractionated: 0.46 IU/mL (ref 0.30–0.70)
Heparin Unfractionated: 0.47 IU/mL (ref 0.30–0.70)

## 2023-07-02 LAB — PHOSPHORUS: Phosphorus: 3.5 mg/dL (ref 2.5–4.6)

## 2023-07-02 LAB — MAGNESIUM: Magnesium: 2.1 mg/dL (ref 1.7–2.4)

## 2023-07-02 MED ORDER — SODIUM CHLORIDE 0.9 % IV SOLN
1.0000 g | INTRAVENOUS | Status: DC
  Administered 2023-07-02 – 2023-07-07 (×6): 1 g via INTRAVENOUS
  Filled 2023-07-02 (×6): qty 10

## 2023-07-02 MED ORDER — CHLORHEXIDINE GLUCONATE CLOTH 2 % EX PADS
6.0000 | MEDICATED_PAD | Freq: Every day | CUTANEOUS | Status: DC
  Administered 2023-07-02 – 2023-07-09 (×8): 6 via TOPICAL

## 2023-07-02 MED ORDER — POTASSIUM CHLORIDE ER 10 MEQ PO TBCR
20.0000 meq | EXTENDED_RELEASE_TABLET | Freq: Three times a day (TID) | ORAL | Status: DC
  Administered 2023-07-02 (×3): 20 meq via ORAL
  Filled 2023-07-02 (×7): qty 2

## 2023-07-02 MED ORDER — MAGNESIUM HYDROXIDE 400 MG/5ML PO SUSP
30.0000 mL | Freq: Every day | ORAL | Status: DC | PRN
  Administered 2023-07-02: 30 mL via ORAL
  Filled 2023-07-02 (×2): qty 30

## 2023-07-02 NOTE — Progress Notes (Signed)
ANTICOAGULATION CONSULT NOTE  Pharmacy Consult for Heparin Indication: chest pain/ACS  No Known Allergies  Patient Measurements: Height: 5\' 10"  (177.8 cm) Weight: 118.4 kg (261 lb 0.4 oz) IBW/kg (Calculated) : 73 Heparin Dosing Weight: 106 kg  Vital Signs: Temp: 97.5 F (36.4 C) (07/05 1214) Temp Source: Oral (07/05 1214) BP: 158/71 (07/05 1214) Pulse Rate: 78 (07/05 1214)  Labs: Recent Labs    07/01/23 1853 07/01/23 2100 07/02/23 0658 07/02/23 1440  HGB 11.5*  --  10.5*  --   HCT 33.1*  --  29.9*  --   PLT 206  --  190  --   APTT 28  --   --   --   LABPROT 14.2  --   --   --   INR 1.1  --   --   --   HEPARINUNFRC  --   --  0.46 0.47  CREATININE 2.11*  --  1.77*  --   TROPONINIHS 635* 573*  --   --      Estimated Creatinine Clearance: 31.5 mL/min (A) (by C-G formula based on SCr of 1.77 mg/dL (H)).   Medical History: Past Medical History:  Diagnosis Date   Coronary artery disease    Hyperlipidemia    Obesity    Penile cancer (HCC)    Swelling of extremity    Left Leg    Assessment: 89 y.o. M presents with weakness and fall. Found to have troponin 635. To begin heparin for r/o ACS. No AC PTA. Preliminary LE doppler negative for DVT.  Confirmatory heparin level remains therapeutic (0.47) on infusion at 1500 units/hr. No bleeding noted.  Goal of Therapy:  Heparin level 0.3-0.7 units/ml Monitor platelets by anticoagulation protocol: Yes   Plan:  Continue heparin gtt at 1500 units/hr Daily heparin level and CBC  Thank you for allowing pharmacy to be a part of this patient's care.   Christoper Fabian, PharmD, BCPS Please see amion for complete clinical pharmacist phone list 07/02/2023 4:10 PM

## 2023-07-02 NOTE — Progress Notes (Signed)
ANTICOAGULATION CONSULT NOTE  Pharmacy Consult for Heparin Indication: chest pain/ACS  No Known Allergies  Patient Measurements: Height: 5\' 10"  (177.8 cm) Weight: 118.4 kg (261 lb 0.4 oz) IBW/kg (Calculated) : 73 Heparin Dosing Weight: 106 kg  Vital Signs: Temp: 97.5 F (36.4 C) (07/05 0736) Temp Source: Oral (07/05 0736) BP: 115/71 (07/05 0736) Pulse Rate: 84 (07/05 0736)  Labs: Recent Labs    07/01/23 1853 07/01/23 2100 07/02/23 0658  HGB 11.5*  --  10.5*  HCT 33.1*  --  29.9*  PLT 206  --  190  APTT 28  --   --   LABPROT 14.2  --   --   INR 1.1  --   --   HEPARINUNFRC  --   --  0.46  CREATININE 2.11*  --  1.77*  TROPONINIHS 635* 573*  --      Estimated Creatinine Clearance: 31.5 mL/min (A) (by C-G formula based on SCr of 1.77 mg/dL (H)).   Medical History: Past Medical History:  Diagnosis Date   Coronary artery disease    Hyperlipidemia    Obesity    Penile cancer (HCC)    Swelling of extremity    Left Leg    Medications:  Home med rec pending  Assessment: 89 y.o. M presents with weakness and fall. Found to have troponin 635. To begin heparin for r/o ACS. No AC PTA. Hgb down a little to 10.5, plt wnl.  Heparin level therapeutic this morning at 0.46. No issues with infusion or bleeding reported.  Goal of Therapy:  Heparin level 0.3-0.7 units/ml Monitor platelets by anticoagulation protocol: Yes   Plan:  Continue Heparin gtt at 1500 units/hr Will f/u heparin level in 8 hours Daily heparin level and CBC    Thank you for allowing pharmacy to be a part of this patient's care.   Signe Colt, PharmD 07/02/2023 9:22 AM  **Pharmacist phone directory can be found on [email or web address] listed under [facility]**

## 2023-07-02 NOTE — Plan of Care (Signed)
  Problem: Education: Goal: Knowledge of General Education information will improve Description: Including pain rating scale, medication(s)/side effects and non-pharmacologic comfort measures Outcome: Progressing   Problem: Health Behavior/Discharge Planning: Goal: Ability to manage health-related needs will improve Outcome: Progressing   Problem: Clinical Measurements: Goal: Ability to maintain clinical measurements within normal limits will improve Outcome: Progressing Goal: Will remain free from infection Outcome: Progressing Goal: Diagnostic test results will improve Outcome: Progressing   Problem: Activity: Goal: Risk for activity intolerance will decrease Outcome: Progressing   Problem: Nutrition: Goal: Adequate nutrition will be maintained Outcome: Progressing   Problem: Coping: Goal: Level of anxiety will decrease Outcome: Progressing   Problem: Elimination: Goal: Will not experience complications related to bowel motility Outcome: Progressing Goal: Will not experience complications related to urinary retention Outcome: Progressing   Problem: Pain Managment: Goal: General experience of comfort will improve Outcome: Progressing   Problem: Safety: Goal: Ability to remain free from injury will improve Outcome: Progressing   

## 2023-07-02 NOTE — Progress Notes (Signed)
Ref: Assunta Found, MD   Subjective:  Awake. Afebrile. VS stable. Echocardiogram shows mild AS, moderate MR, moderate LVH and preserved LV systolic function. SR, RBBB and 1st degree AV block. Hyponatremia and hypokalemia present. Right leg more red and swollen.    Objective:  Vital Signs in the last 24 hours: Temp:  [97.4 F (36.3 C)-98.3 F (36.8 C)] 97.5 F (36.4 C) (07/05 1214) Pulse Rate:  [67-86] 78 (07/05 1214) Cardiac Rhythm: Normal sinus rhythm (07/04 2231) Resp:  [16-19] 17 (07/05 1214) BP: (102-158)/(58-80) 158/71 (07/05 1214) SpO2:  [91 %-99 %] 96 % (07/05 1214) Weight:  [113.4 kg-120 kg] 118.4 kg (07/05 0411)  Physical Exam: BP Readings from Last 1 Encounters:  07/02/23 (!) 158/71     Wt Readings from Last 1 Encounters:  07/02/23 118.4 kg    Weight change:  Body mass index is 37.45 kg/m. HEENT: [place]/AT, Eyes-Blue, Conjunctiva-Pink, Sclera-Non-icteric Neck: No JVD, No bruit, Trachea midline. Lungs:  Clearing, Bilateral. Cardiac:  Regular rhythm, normal S1 and S2, no S3. II/VI systolic murmur. Abdomen:  Soft, non-tender. BS present. Extremities:  Mild swelling and redness of both legs right more than left. No cyanosis. No clubbing. CNS: AxOx3, Cranial nerves grossly intact, moves all 4 extremities.  Skin: Warm and dry.   Intake/Output from previous day: 07/04 0701 - 07/05 0700 In: 500 [IV Piggyback:500] Out: 695 [Urine:695]    Lab Results: BMET    Component Value Date/Time   NA 123 (L) 07/02/2023 0658   NA 121 (L) 07/01/2023 1853   NA 140 05/25/2022 1110   NA 141 09/05/2021 0000   NA 140 08/20/2021 0000   K 2.8 (L) 07/02/2023 0658   K 3.6 07/01/2023 1853   K 4.3 05/25/2022 1110   CL 85 (L) 07/02/2023 0658   CL 83 (L) 07/01/2023 1853   CL 110 05/25/2022 1110   CO2 25 07/02/2023 0658   CO2 20 (L) 07/01/2023 1853   CO2 26 05/25/2022 1110   GLUCOSE 93 07/02/2023 0658   GLUCOSE 144 (H) 07/01/2023 1853   GLUCOSE 82 05/25/2022 1110   BUN 36 (H)  07/02/2023 0658   BUN 39 (H) 07/01/2023 1853   BUN 28 (H) 05/25/2022 1110   BUN 19 09/05/2021 0000   BUN 31 (A) 08/20/2021 0000   CREATININE 1.77 (H) 07/02/2023 0658   CREATININE 2.11 (H) 07/01/2023 1853   CREATININE 1.46 (H) 05/25/2022 1110   CALCIUM 8.7 (L) 07/02/2023 0658   CALCIUM 8.6 (L) 07/01/2023 1853   CALCIUM 9.4 05/25/2022 1110   GFRNONAA 35 (L) 07/02/2023 0658   GFRNONAA 28 (L) 07/01/2023 1853   GFRNONAA 44 (L) 05/25/2022 1110   GFRAA 52.44 09/05/2021 0000   GFRAA 44.28 08/20/2021 0000   GFRAA 48 (L) 03/17/2016 2018   CBC    Component Value Date/Time   WBC 7.9 07/02/2023 0658   RBC 3.23 (L) 07/02/2023 0658   HGB 10.5 (L) 07/02/2023 0658   HCT 29.9 (L) 07/02/2023 0658   PLT 190 07/02/2023 0658   MCV 92.6 07/02/2023 0658   MCH 32.5 07/02/2023 0658   MCHC 35.1 07/02/2023 0658   RDW 15.1 07/02/2023 0658   LYMPHSABS 0.4 (L) 07/01/2023 1853   MONOABS 0.6 07/01/2023 1853   EOSABS 0.0 07/01/2023 1853   BASOSABS 0.0 07/01/2023 1853   HEPATIC Function Panel Recent Labs    07/01/23 1853  PROT 7.3  ALBUMIN 3.9  AST 37  ALT 11  ALKPHOS 49   HEMOGLOBIN A1C Lab Results  Component Value  Date   MPG 105.41 11/12/2021   CARDIAC ENZYMES Lab Results  Component Value Date   CKTOTAL 91 07/15/2021   BNP No results for input(s): "PROBNP" in the last 8760 hours. TSH No results for input(s): "TSH" in the last 8760 hours. CHOLESTEROL No results for input(s): "CHOL" in the last 8760 hours.  Scheduled Meds:  Chlorhexidine Gluconate Cloth  6 each Topical Daily   potassium chloride  20 mEq Oral TID   Continuous Infusions:  sodium chloride 50 mL/hr at 07/02/23 0917   cefTRIAXone (ROCEPHIN)  IV 1 g (07/02/23 1220)   heparin 1,500 Units/hr (07/02/23 0919)   PRN Meds:.  Assessment/Plan: Dehydration Abnormal troponin I from demand ischemia Acute renal failure Severe hyponatremia Moderate hypokalemia CAD PVD Obesity Bilateral leg swelling and  redness  Plan: Vascular US lower ext, r/o DVT Continue IV saline. Add potassium supplement   LOS: 1 day   Time spent including chart review, lab review, examination, discussion with patient/Nurse :30  min   Orpah Cobb  MD  07/02/2023, 12:30 PM

## 2023-07-02 NOTE — Progress Notes (Signed)
*  PRELIMINARY RESULTS* Echocardiogram 2D Echocardiogram has been performed.  Caleb [name] 07/02/2023, 10:54 AM

## 2023-07-03 LAB — BASIC METABOLIC PANEL
Anion gap: 9 (ref 5–15)
BUN: 38 mg/dL — ABNORMAL HIGH (ref 8–23)
CO2: 28 mmol/L (ref 22–32)
Calcium: 8.6 mg/dL — ABNORMAL LOW (ref 8.9–10.3)
Chloride: 89 mmol/L — ABNORMAL LOW (ref 98–111)
Creatinine, Ser: 2.16 mg/dL — ABNORMAL HIGH (ref 0.61–1.24)
GFR, Estimated: 27 mL/min — ABNORMAL LOW (ref >60)
Glucose, Bld: 91 mg/dL (ref 70–99)
Potassium: 3.5 mmol/L (ref 3.5–5.1)
Sodium: 126 mmol/L — ABNORMAL LOW (ref 135–145)

## 2023-07-03 LAB — CBC
HCT: 29 % — ABNORMAL LOW (ref 39.0–52.0)
Hemoglobin: 10.2 g/dL — ABNORMAL LOW (ref 13.0–17.0)
MCH: 32.7 pg (ref 26.0–34.0)
MCHC: 35.2 g/dL (ref 30.0–36.0)
MCV: 92.9 fL (ref 80.0–100.0)
Platelets: 159 10*3/uL (ref 150–400)
RBC: 3.12 MIL/uL — ABNORMAL LOW (ref 4.22–5.81)
RDW: 15.2 % (ref 11.5–15.5)
WBC: 6.7 10*3/uL (ref 4.0–10.5)
nRBC: 0 % (ref 0.0–0.2)

## 2023-07-03 LAB — HEPARIN LEVEL (UNFRACTIONATED): Heparin Unfractionated: 0.45 IU/mL (ref 0.30–0.70)

## 2023-07-03 MED ORDER — POTASSIUM CHLORIDE CRYS ER 10 MEQ PO TBCR
20.0000 meq | EXTENDED_RELEASE_TABLET | Freq: Two times a day (BID) | ORAL | Status: DC
  Administered 2023-07-03 – 2023-07-04 (×3): 20 meq via ORAL
  Filled 2023-07-03 (×3): qty 2

## 2023-07-03 MED ORDER — PANTOPRAZOLE SODIUM 40 MG PO TBEC
40.0000 mg | DELAYED_RELEASE_TABLET | Freq: Every day | ORAL | Status: DC
  Administered 2023-07-03 – 2023-07-09 (×7): 40 mg via ORAL
  Filled 2023-07-03 (×7): qty 1

## 2023-07-03 MED ORDER — SIMVASTATIN 20 MG PO TABS
20.0000 mg | ORAL_TABLET | Freq: Every day | ORAL | Status: DC
  Administered 2023-07-03 – 2023-07-08 (×6): 20 mg via ORAL
  Filled 2023-07-03 (×6): qty 1

## 2023-07-03 MED ORDER — POLYETHYLENE GLYCOL 3350 17 G PO PACK
17.0000 g | PACK | Freq: Every day | ORAL | Status: DC
  Administered 2023-07-05 – 2023-07-09 (×3): 17 g via ORAL
  Filled 2023-07-03 (×5): qty 1

## 2023-07-03 MED ORDER — ASPIRIN 81 MG PO TBEC
81.0000 mg | DELAYED_RELEASE_TABLET | Freq: Every day | ORAL | Status: DC
  Administered 2023-07-03 – 2023-07-04 (×2): 81 mg via ORAL
  Filled 2023-07-03 (×2): qty 1

## 2023-07-03 MED ORDER — ADULT MULTIVITAMIN W/MINERALS CH
1.0000 | ORAL_TABLET | Freq: Every day | ORAL | Status: DC
  Administered 2023-07-03 – 2023-07-09 (×7): 1 via ORAL
  Filled 2023-07-03 (×7): qty 1

## 2023-07-03 NOTE — Progress Notes (Signed)
ANTICOAGULATION CONSULT NOTE  Pharmacy Consult for Heparin Indication: chest pain/ACS  No Known Allergies  Patient Measurements: Height: 5\' 10"  (177.8 cm) Weight: 120.2 kg (264 lb 15.9 oz) IBW/kg (Calculated) : 73 Heparin Dosing Weight: 106 kg  Vital Signs: Temp: 97.6 F (36.4 C) (07/06 0500) Temp Source: Oral (07/06 0500) BP: 119/70 (07/06 0500) Pulse Rate: 62 (07/06 0500)  Labs: Recent Labs    07/01/23 1853 07/01/23 2100 07/02/23 0658 07/02/23 1440 07/03/23 0029  HGB 11.5*  --  10.5*  --  10.2*  HCT 33.1*  --  29.9*  --  29.0*  PLT 206  --  190  --  159  APTT 28  --   --   --   --   LABPROT 14.2  --   --   --   --   INR 1.1  --   --   --   --   HEPARINUNFRC  --   --  0.46 0.47 0.45  CREATININE 2.11*  --  1.77*  --  2.16*  TROPONINIHS 635* 573*  --   --   --      Estimated Creatinine Clearance: 26 mL/min (A) (by C-G formula based on SCr of 2.16 mg/dL (H)).   Medical History: Past Medical History:  Diagnosis Date   Coronary artery disease    Hyperlipidemia    Obesity    Penile cancer (HCC)    Swelling of extremity    Left Leg    Assessment: 89 y.o. M presents with weakness and fall. Found to have troponin 635. To begin heparin for r/o ACS. No AC PTA. Preliminary LE doppler negative for DVT.  Heparin level remains therapeutic (0.45) on heparin infusion at 1500 units/hr. No bleeding noted. CBC stable   Goal of Therapy:  Heparin level 0.3-0.7 units/ml Monitor platelets by anticoagulation protocol: Yes   Plan:  Continue heparin gtt at 1500 units/hr Daily heparin level and CBC    Leota Sauers Pharm.D. CPP, BCPS Clinical Pharmacist [phone] 07/03/2023 7:53 AM   Please see amion for complete clinical pharmacist phone list 07/03/2023 7:52 AM

## 2023-07-03 NOTE — Plan of Care (Signed)

## 2023-07-03 NOTE — Progress Notes (Signed)
Ref: Assunta Found, MD   Subjective:  Awake.  Hyponatremia mildly improved. Hypokalemia is low normal. Good urine output. DVT of lower extremities is ruled out.  Objective:  Vital Signs in the last 24 hours: Temp:  [97.5 F (36.4 C)-98.7 F (37.1 C)] 98.7 F (37.1 C) (07/06 0900) Pulse Rate:  [62-81] 72 (07/06 0900) Cardiac Rhythm: Normal sinus rhythm (07/06 0700) Resp:  [16-19] 19 (07/06 0900) BP: (110-158)/(54-71) 112/64 (07/06 0900) SpO2:  [94 %-97 %] 94 % (07/06 0900) Weight:  [120.2 kg] 120.2 kg (07/06 0500)  Physical Exam: BP Readings from Last 1 Encounters:  07/03/23 112/64     Wt Readings from Last 1 Encounters:  07/03/23 120.2 kg    Weight change: 6.801 kg Body mass index is 38.02 kg/m. HEENT: [place]/AT, Eyes-Blue,  Conjunctiva-Pink, Sclera-Non-icteric Neck: No JVD, No bruit, Trachea midline. Lungs:  Clearing, Bilateral. Cardiac:  Regular rhythm, normal S1 and S2, no S3. II/VI systolic murmur. Abdomen:  Soft, non-tender. BS present. Extremities:  Mild edema present. No cyanosis. No clubbing. Venous stasis dermatitis of both lower leg CNS: AxOx3, Cranial nerves grossly intact, moves all 4 extremities.  Skin: Warm and dry.   Intake/Output from previous day: 07/05 0701 - 07/06 0700 In: 2180 [P.O.:1000; I.V.:1080; IV Piggyback:100] Out: 3750 [Urine:3750]    Lab Results: BMET    Component Value Date/Time   NA 126 (L) 07/03/2023 0029   NA 123 (L) 07/02/2023 0658   NA 121 (L) 07/01/2023 1853   NA 141 09/05/2021 0000   NA 140 08/20/2021 0000   K 3.5 07/03/2023 0029   K 2.8 (L) 07/02/2023 0658   K 3.6 07/01/2023 1853   CL 89 (L) 07/03/2023 0029   CL 85 (L) 07/02/2023 0658   CL 83 (L) 07/01/2023 1853   CO2 28 07/03/2023 0029   CO2 25 07/02/2023 0658   CO2 20 (L) 07/01/2023 1853   GLUCOSE 91 07/03/2023 0029   GLUCOSE 93 07/02/2023 0658   GLUCOSE 144 (H) 07/01/2023 1853   BUN 38 (H) 07/03/2023 0029   BUN 36 (H) 07/02/2023 0658   BUN 39 (H) 07/01/2023  1853   BUN 19 09/05/2021 0000   BUN 31 (A) 08/20/2021 0000   CREATININE 2.16 (H) 07/03/2023 0029   CREATININE 1.77 (H) 07/02/2023 0658   CREATININE 2.11 (H) 07/01/2023 1853   CALCIUM 8.6 (L) 07/03/2023 0029   CALCIUM 8.7 (L) 07/02/2023 0658   CALCIUM 8.6 (L) 07/01/2023 1853   GFRNONAA 27 (L) 07/03/2023 0029   GFRNONAA 35 (L) 07/02/2023 0658   GFRNONAA 28 (L) 07/01/2023 1853   GFRAA 52.44 09/05/2021 0000   GFRAA 44.28 08/20/2021 0000   GFRAA 48 (L) 03/17/2016 2018   CBC    Component Value Date/Time   WBC 6.7 07/03/2023 0029   RBC 3.12 (L) 07/03/2023 0029   HGB 10.2 (L) 07/03/2023 0029   HCT 29.0 (L) 07/03/2023 0029   PLT 159 07/03/2023 0029   MCV 92.9 07/03/2023 0029   MCH 32.7 07/03/2023 0029   MCHC 35.2 07/03/2023 0029   RDW 15.2 07/03/2023 0029   LYMPHSABS 0.4 (L) 07/01/2023 1853   MONOABS 0.6 07/01/2023 1853   EOSABS 0.0 07/01/2023 1853   BASOSABS 0.0 07/01/2023 1853   HEPATIC Function Panel Recent Labs    07/01/23 1853  PROT 7.3  ALBUMIN 3.9  AST 37  ALT 11  ALKPHOS 49   HEMOGLOBIN A1C Lab Results  Component Value Date   MPG 105.41 11/12/2021   CARDIAC ENZYMES Lab Results  Component Value Date   CKTOTAL 91 07/15/2021   BNP No results for input(s): "PROBNP" in the last 8760 hours. TSH No results for input(s): "TSH" in the last 8760 hours. CHOLESTEROL No results for input(s): "CHOL" in the last 8760 hours.  Scheduled Meds:  aspirin EC  81 mg Oral Daily   Chlorhexidine Gluconate Cloth  6 each Topical Daily   multivitamin with minerals  1 tablet Oral Daily   pantoprazole  40 mg Oral Daily   polyethylene glycol  17 g Oral Daily   potassium chloride  20 mEq Oral BID   simvastatin  20 mg Oral QHS   Continuous Infusions:  sodium chloride 50 mL/hr at 07/03/23 1041   cefTRIAXone (ROCEPHIN)  IV 200 mL/hr at 07/03/23 1041   heparin 1,500 Units/hr (07/03/23 1041)   PRN Meds:.magnesium hydroxide  Assessment/Plan: Bilateral lower extremities  cellulitis Dehydration, improving Abnormal troponin I from demand ischemia Acute renal failure Severe hyponatremia Moderate hypokalemia CAD PVD Obesity Possible SIADH  Plan: Continue IV antibiotics and fluids. Medical treatment.   LOS: 2 days   Time spent including chart review, lab review, examination, discussion with patient/Nurse : 30 min   Orpah Cobb  MD  07/03/2023, 11:34 AM

## 2023-07-03 NOTE — Evaluation (Signed)
Physical Therapy Evaluation Patient Details Name: Caleb [name] MRN: [id number] DOB: January 21, 1925 Today's Date: 07/03/2023  History of Present Illness  89 years old Caleb [name] male admitted 7/4 after being  found on his bedroom floor by his live-in caregiver.  Pt with  2 day h/o weakness and fall x 2.   Pt with dehydration and r/o DVT.  Also w/u for ACS.  PMH of CAD, HLD, PVD, Venous stasis dermatitis and obesity  Clinical Impression  Pt admitted with above diagnosis. Pt was unable to stand to his feet with 1 person assist. Pt needing mod assist to come to eOB with lots of effort by pt and therapist. Will benefit from post acute therapy <3 hours day prior to d/c home.  Pt currently with functional limitations due to the deficits listed below (see PT Problem List). Pt will benefit from acute skilled PT to increase their independence and safety with mobility to allow discharge.           Assistance Recommended at Discharge Frequent or constant Supervision/Assistance  If plan is discharge home, recommend the following:  Can travel by private vehicle  Two people to help with walking and/or transfers;Two people to help with bathing/dressing/bathroom;Assistance with cooking/housework;Assist for transportation;Help with stairs or ramp for entrance   No    Equipment Recommendations None recommended by PT  Recommendations for Other Services       Functional Status Assessment Patient has had a recent decline in their functional status and demonstrates the ability to make significant improvements in function in a reasonable and predictable amount of time.     Precautions / Restrictions Precautions Precautions: Fall Restrictions Weight Bearing Restrictions: No      Mobility  Bed Mobility Overal bed mobility: Needs Assistance Bed Mobility: Supine to Sit     Supine to sit: Mod assist, HOB elevated     General bed mobility comments: Needed a lot of assist to come to eOB with assist for LES and  for trunk.  Used pad to scoot pt to EOB.    Transfers Overall transfer level: Needs assistance Equipment used: Rolling Pospisil (2 wheels) Transfers: Sit to/from Stand Sit to Stand: Total assist, From elevated surface           General transfer comment: Pt could not stand up with total assist and cues.Made several attempts and pt barely cleared bottom.  Only had 1 person to aasist therefore will bring +2 next visit.    Ambulation/Gait                  Stairs            Wheelchair Mobility     Tilt Bed    Modified Rankin (Stroke Patients Only)       Balance Overall balance assessment: Needs assistance Sitting-balance support: No upper extremity supported, Feet supported Sitting balance-Leahy Scale: Fair     Standing balance support: Bilateral upper extremity supported, During functional activity Standing balance-Leahy Scale: Zero Standing balance comment: could not stand with 1 person asist                             Pertinent Vitals/Pain      Home Living                          Prior Function  Hand Dominance        Extremity/Trunk Assessment   Upper Extremity Assessment Upper Extremity Assessment: Defer to OT evaluation    Lower Extremity Assessment Lower Extremity Assessment: RLE deficits/detail;LLE deficits/detail RLE Deficits / Details: grossly 2+/5 LLE Deficits / Details: grossly 2+/5    Cervical / Trunk Assessment Cervical / Trunk Assessment: Kyphotic  Communication      Cognition                                                General Comments General comments (skin integrity, edema, etc.): VSS    Exercises     Assessment/Plan    PT Assessment Patient needs continued PT services  PT Problem List Decreased activity tolerance;Decreased balance;Decreased strength;Decreased mobility;Decreased knowledge of use of DME;Decreased safety awareness;Decreased  knowledge of precautions;Cardiopulmonary status limiting activity       PT Treatment Interventions DME instruction;Gait training;Functional mobility training;Therapeutic activities;Therapeutic exercise;Balance training;Patient/family education    PT Goals (Current goals can be found in the Care Plan section)  Acute Rehab PT Goals Patient Stated Goal: to get Rehab PT Goal Formulation: With patient Time For Goal Achievement: 07/17/23 Potential to Achieve Goals: Good    Frequency Min 1X/week     Co-evaluation               AM-PAC PT "6 Clicks" Mobility  Outcome Measure Help needed turning from your back to your side while in a flat bed without using bedrails?: A Lot Help needed moving from lying on your back to sitting on the side of a flat bed without using bedrails?: A Lot Help needed moving to and from a bed to a chair (including a wheelchair)?: Total Help needed standing up from a chair using your arms (e.g., wheelchair or bedside chair)?: Total Help needed to walk in hospital room?: Total Help needed climbing 3-5 steps with a railing? : Total 6 Click Score: 8    End of Session Equipment Utilized During Treatment: Gait belt Activity Tolerance: Patient limited by fatigue Patient left: in bed;with call bell/phone within reach;with bed alarm set Nurse Communication: Mobility status PT Visit Diagnosis: Unsteadiness on feet (R26.81);Muscle weakness (generalized) (M62.81)    Time: 1110-1130 PT Time Calculation (min) (ACUTE ONLY): 20 min   Charges:   PT Evaluation $PT Eval Moderate Complexity: 1 Mod   PT General Charges $$ ACUTE PT VISIT: 1 Visit         Ethel Meisenheimer M,PT Acute Rehab Services [phone]   Bevelyn Buckles 07/03/2023, 3:07 PM

## 2023-07-04 LAB — CBC
HCT: 28.1 % — ABNORMAL LOW (ref 39.0–52.0)
Hemoglobin: 9.6 g/dL — ABNORMAL LOW (ref 13.0–17.0)
MCH: 32.4 pg (ref 26.0–34.0)
MCHC: 34.2 g/dL (ref 30.0–36.0)
MCV: 94.9 fL (ref 80.0–100.0)
Platelets: 157 K/uL (ref 150–400)
RBC: 2.96 MIL/uL — ABNORMAL LOW (ref 4.22–5.81)
RDW: 15.3 % (ref 11.5–15.5)
WBC: 6.9 K/uL (ref 4.0–10.5)
nRBC: 0 % (ref 0.0–0.2)

## 2023-07-04 LAB — BASIC METABOLIC PANEL WITH GFR
Anion gap: 11 (ref 5–15)
BUN: 35 mg/dL — ABNORMAL HIGH (ref 8–23)
CO2: 24 mmol/L (ref 22–32)
Calcium: 8.6 mg/dL — ABNORMAL LOW (ref 8.9–10.3)
Chloride: 92 mmol/L — ABNORMAL LOW (ref 98–111)
Creatinine, Ser: 1.65 mg/dL — ABNORMAL HIGH (ref 0.61–1.24)
GFR, Estimated: 38 mL/min — ABNORMAL LOW
Glucose, Bld: 96 mg/dL (ref 70–99)
Potassium: 4.2 mmol/L (ref 3.5–5.1)
Sodium: 127 mmol/L — ABNORMAL LOW (ref 135–145)

## 2023-07-04 LAB — HEPARIN LEVEL (UNFRACTIONATED)
Heparin Unfractionated: 0.21 [IU]/mL — ABNORMAL LOW (ref 0.30–0.70)
Heparin Unfractionated: 0.4 IU/mL (ref 0.30–0.70)

## 2023-07-04 MED ORDER — SPIRONOLACTONE 12.5 MG HALF TABLET
12.5000 mg | ORAL_TABLET | Freq: Every day | ORAL | Status: DC
  Administered 2023-07-04 – 2023-07-09 (×6): 12.5 mg via ORAL
  Filled 2023-07-04 (×6): qty 1

## 2023-07-04 MED ORDER — METOPROLOL TARTRATE 12.5 MG HALF TABLET
12.5000 mg | ORAL_TABLET | Freq: Two times a day (BID) | ORAL | Status: DC
  Administered 2023-07-04 – 2023-07-09 (×11): 12.5 mg via ORAL
  Filled 2023-07-04 (×11): qty 1

## 2023-07-04 MED ORDER — HEPARIN SODIUM (PORCINE) 5000 UNIT/ML IJ SOLN
5000.0000 [IU] | Freq: Three times a day (TID) | INTRAMUSCULAR | Status: DC
  Administered 2023-07-04 – 2023-07-09 (×15): 5000 [IU] via SUBCUTANEOUS
  Filled 2023-07-04 (×16): qty 1

## 2023-07-04 MED ORDER — CELECOXIB 100 MG PO CAPS
100.0000 mg | ORAL_CAPSULE | Freq: Two times a day (BID) | ORAL | Status: DC
  Administered 2023-07-04 – 2023-07-09 (×11): 100 mg via ORAL
  Filled 2023-07-04 (×12): qty 1

## 2023-07-04 MED ORDER — POTASSIUM CHLORIDE CRYS ER 10 MEQ PO TBCR
10.0000 meq | EXTENDED_RELEASE_TABLET | Freq: Two times a day (BID) | ORAL | Status: DC
  Administered 2023-07-04 – 2023-07-05 (×2): 10 meq via ORAL
  Filled 2023-07-04 (×2): qty 1

## 2023-07-04 NOTE — H&P (Signed)
Inpatient Rehab Admissions Coordinator:   Per therapy recommendations, patient was screened for CIR candidacy by Megan Salon, MS, CCC-SLP . At this time, Pt. is Megan Salon, MS, CCC-SLP  Pt. may have potential to progress to becoming a potential CIR candidate, so CIR admissions team will follow and monitor for progress and participation with therapies and place consult order if Pt. appears to be an appropriate candidate. Please contact me with any questions.   Megan Salon, MS, CCC-SLP Rehab Admissions Coordinator  [phone] (celll) [phone] (office)

## 2023-07-04 NOTE — TOC Initial Note (Signed)
Transition of Care [facility]) - Initial/Assessment Note    Patient Details  Name: Caleb [name] MRN: [id number] Date of Birth: 1925-09-29  [facility] CM/SW Contact:    Delilah Shan, LCSWA Phone Number: 07/04/2023, 11:11 AM  Clinical Narrative:                  CSW received consult for possible SNF placement at time of discharge. CSW spoke with patient regarding PT recommendation of SNF placement at time of discharge. Patient reports he comes from home with live in caregiver. Patient expressed understanding of PT recommendation and is agreeable to SNF placement at time of discharge. Patient gave CSW permission to fax referral near the [facility] area for possible SNF placement.CSW discussed insurance authorization process and will provide Medicare SNF ratings list with accepted SNF bed offers when available.  No further questions reported at this time. CSW to continue to follow and assist with discharge planning needs.   Expected Discharge Plan: Skilled Nursing Facility Barriers to Discharge: Continued Medical Work up   Patient Goals and CMS Choice Patient states their goals for this hospitalization and ongoing recovery are:: SNF   Choice offered to / list presented to : Patient      Expected Discharge Plan and Services In-house Referral: Clinical Social Work     Living arrangements for the past 2 months: Single Family Home                                      Prior Living Arrangements/Services Living arrangements for the past 2 months: Single Family Home Lives with:: Other (Comment) (live in caregiver) Patient language and need for interpreter reviewed:: Yes Do you feel safe going back to the place where you live?: No   SNF  Need for Family Participation in Patient Care: Yes (Comment) Care giver support system in place?: Yes (comment)   Criminal Activity/Legal Involvement Pertinent to Current Situation/Hospitalization: No - Comment as needed  Activities  of Daily Living Home Assistive Devices/Equipment: [place] (specify type) (four wheel) ADL Screening (condition at time of admission) Patient's cognitive ability adequate to safely complete daily activities?: Yes Is the patient deaf or have difficulty hearing?: Yes Does the patient have difficulty seeing, even when wearing glasses/contacts?: No Does the patient have difficulty concentrating, remembering, or making decisions?: No Patient able to express need for assistance with ADLs?: Yes Does the patient have difficulty dressing or bathing?: Yes Independently performs ADLs?: No Communication: Independent Dressing (OT): Needs assistance Is this a change from baseline?: Pre-admission baseline Grooming: Independent Feeding: Independent Bathing: Needs assistance Is this a change from baseline?: Pre-admission baseline Toileting: Independent In/Out Bed: Independent with device (comment) Dan Humphreys) Walks in Home: Independent with device (comment) Does the patient have difficulty walking or climbing stairs?: Yes Weakness of Legs: Both Weakness of Arms/Hands: None  Permission Sought/Granted Permission sought to share information with : Case Manager, Other (comment), Magazine features editor Permission granted to share information with : Yes, Verbal Permission Granted  Share Information with NAME: Chrissie Noa  Permission granted to share info w AGENCY: SNF  Permission granted to share info w Relationship: son  Permission granted to share info w Contact Information: Chrissie Noa [phone]  Emotional Assessment Appearance:: Appears stated age Attitude/Demeanor/Rapport: Gracious Affect (typically observed): Calm Orientation: : Oriented to Self, Oriented to Place, Oriented to  Time, Oriented to Situation Alcohol / Substance Use: Not Applicable Psych Involvement: No (comment)  Admission diagnosis:  Hyponatremia [E87.1] ACS (acute coronary syndrome) (HCC) [I24.9] Urinary tract infection with  hematuria, site unspecified [N39.0, R31.9] Patient Active Problem List   Diagnosis Date Noted   ACS (acute coronary syndrome) (HCC) 07/01/2023   Foley catheter problem (HCC) 02/02/2022   AKI (acute kidney injury) (HCC) 07/31/2021   Multiple duodenal ulcers 07/31/2021   Neurocognitive deficits 07/31/2021   Hematemesis 07/19/2021   Hyperkalemia 07/19/2021   Leukocytosis 07/19/2021   SIRS (systemic inflammatory response syndrome) (HCC) 07/19/2021   Acute blood loss anemia 07/19/2021   Transient hypotension 07/19/2021   GIB (gastrointestinal bleeding) 07/19/2021   Antiplatelet or antithrombotic long-term use    CKD (chronic kidney disease), stage III (HCC) 07/17/2021   Macrocytic anemia 07/17/2021   Atrial fibrillation (HCC) 07/17/2021   Pressure ulcers of skin of multiple topographic sites 07/17/2021   Rhabdomyolysis 07/11/2021   Fall at home, initial encounter 07/11/2021   Acute lower UTI 07/11/2021   Urinary retention 03/27/2020   Exudative age-related macular degeneration, right eye, with active choroidal neovascularization (HCC) 08/23/2017   BMI 32.0-32.9,adult 02/26/2016   Neurogenic bladder 02/26/2016   Scalp hematoma 01/13/2016   Self-catheterizes urinary bladder 01/13/2016   Pneumothorax, left 01/12/2016   Atypical chest pain 12/02/2015   Muscular deconditioning 02/20/2015   History of penile cancer 01/09/2015   Cellulitis 05/17/2014   Pain in limb- Slight pain- Left Leg 05/03/2014   Discoloration of skin-Left Leg 05/03/2014   Stasis edema with ulcer (HCC) 08/17/2013   Peripheral vascular disease, unspecified (HCC) 07/13/2013   Swelling of limb 07/13/2013   Coronary artery disease 06/07/2013   Left upper extremity numbness 06/07/2013   Hyperlipidemia    Knee pain 08/11/2011   PCP:  Assunta Found, MD Pharmacy:   [facility] [phone] - [place], [place] - [address] AT [facility] OF [address] & [address] [place] [address] Phone:  [phone] Fax: [phone]  [facility] - [place], [place] - [address] [place] [address] Phone: [phone] Fax: [phone]  [id number] [place], [place] - [address] AT [facility] OF [address] [facility] [address] & [facility] [address] [place] [address] Phone: [phone] Fax: [phone]  [facility] - [place], [place] - [address] [place] [address] Phone: [phone] Fax: [phone]     Social Determinants of Health (SDOH) Social History: SDOH Screenings   Food Insecurity: No Food Insecurity (07/01/2023)  Housing: Low Risk  (07/01/2023)  Transportation Needs: No Transportation Needs (07/01/2023)  Utilities: Not At Risk (07/01/2023)  Tobacco Use: Medium Risk (07/01/2023)   SDOH Interventions:     Readmission Risk Interventions    07/15/2021    1:30 PM 07/14/2021   10:40 AM  Readmission Risk Prevention Plan  Post Dischage Appt Complete   Medication Screening  Complete  Transportation Screening  Complete

## 2023-07-04 NOTE — Evaluation (Signed)
Occupational Therapy Evaluation Patient Details Name: Caleb [name] MRN: [id number] DOB: [birth year] Today's Date: 07/04/2023   History of Present Illness 89 years old white male admitted 7/4 after being  found on his bedroom floor by his live-in caregiver.  Pt with  2 day h/o weakness and fall x 2.   Pt with dehydration and r/o DVT.  Also w/u for ACS.  PMH of CAD, HLD, PVD, Venous stasis dermatitis and obesity   Clinical Impression   Pt admitted with s/p fall. Pt currently with functional limitations due to the deficits listed below (see OT Problem List).  Pt will benefit from acute skilled OT to increase their safety and independence with ADL and functional mobility for ADL to facilitate discharge. Pt so pleasant and has a full time caregiver.  Will benefit from post acute rehab prior to DC home with caregiver       Recommendations for follow up therapy are one component of a multi-disciplinary discharge planning process, led by the attending physician.  Recommendations may be updated based on patient status, additional functional criteria and insurance authorization.   Assistance Recommended at Discharge Frequent or constant Supervision/Assistance  Patient can return home with the following A lot of help with walking and/or transfers;A lot of help with bathing/dressing/bathroom;Assistance with cooking/housework    Functional Status Assessment  Patient has had a recent decline in their functional status and demonstrates the ability to make significant improvements in function in a reasonable and predictable amount of time.  Equipment Recommendations  BSC/3in1;Toilet riser    Recommendations for Other Services       Precautions / Restrictions Precautions Precautions: Fall      Mobility Bed Mobility   Bed Mobility: Rolling           General bed mobility comments: ADL activity bed level with OT.  Pt very participative but did complain of hand discomfort.    Transfers                    General transfer comment: did not perform transfer this OT visit. Pt would be a good candidate for a cotreat          ADL either performed or assessed with clinical judgement   ADL Overall ADL's : Independent Eating/Feeding: Minimal assistance;Bed level Eating/Feeding Details (indicate cue type and reason): HOB raised and items opening for pt on tray Grooming: Wash/dry hands;Wash/dry face;Oral care;Bed level;Cueing for safety;Cueing for sequencing;Minimal assistance Grooming Details (indicate cue type and reason): HOB raised Upper Body Bathing: Moderate assistance;Bed level   Lower Body Bathing: Maximal assistance;+2 for physical assistance;+2 for safety/equipment;Bed level Lower Body Bathing Details (indicate cue type and reason): rolling                     Functional mobility during ADLs: Moderate assistance;+2 for physical assistance;+2 for safety/equipment General ADL Comments: with bed mobility.  Pt is a stout guy- will need increased A post DC     Vision Patient Visual Report: No change from baseline              Pertinent Vitals/Pain Pain Assessment Pain Assessment: 0-10 Pain Score: 3  Pain Location: hands- specifically fingers- states arthritis acting up Pain Descriptors / Indicators: Discomfort Pain Intervention(s): Limited activity within patient's tolerance, Monitored during session, Repositioned     Hand Dominance Right   Extremity/Trunk Assessment Upper Extremity Assessment Upper Extremity Assessment: Generalized weakness (noted arthritis in both hands with limted FM skills and opposition,  Reports caregiver A with opening items when needed. Pt was able to hold toothbrush with OT)           Communication Communication Communication: No difficulties   Cognition Arousal/Alertness: Awake/alert Behavior During Therapy: WFL for tasks assessed/performed Overall Cognitive Status: Within Functional Limits for tasks assessed                                                   Home Living Family/patient expects to be discharged to:: Private residence Living Arrangements: Non-relatives/Friends (caregiver) Available Help at Discharge: Available 24 hours/day;Personal care attendant Type of Home: House Home Access: Stairs to enter [employer] of Steps: 2 Entrance Stairs-Rails: Right;Left;Can reach both Home Layout: One level     Bathroom Shower/Tub: Producer, television/film/video: Handicapped height     Home Equipment: Rollator (4 wheels);Cane - single point;Grab bars - tub/shower   Additional Comments: Multiple falls per pt      Prior Functioning/Environment Prior Level of Function : Needs assist       Physical Assist : Mobility (physical);ADLs (physical)   ADLs (physical): Bathing;Dressing;Toileting;IADLs Mobility Comments: used rollator at all times          OT Problem List: Decreased strength;Decreased activity tolerance;Decreased safety awareness;Impaired balance (sitting and/or standing);Decreased coordination      OT Treatment/Interventions: Self-care/ADL training;Patient/family education;Therapeutic exercise;Therapeutic activities    OT Goals(Current goals can be found in the care plan section) Acute Rehab OT Goals Patient Stated Goal: get stronger and get back home OT Goal Formulation: With patient Time For Goal Achievement: 07/18/23 Potential to Achieve Goals: Good ADL Goals Pt Will Perform Grooming: with min assist;sitting Pt Will Transfer to Toilet: with mod assist;stand pivot transfer Pt Will Perform Toileting - Clothing Manipulation and hygiene: with mod assist;sit to/from stand;sitting/lateral leans  OT Frequency: Min 2X/week       AM-PAC OT "6 Clicks" Daily Activity     Outcome Measure Help from another person eating meals?: A Little Help from another person taking care of personal grooming?: A Little Help from another person toileting, which  includes using toliet, bedpan, or urinal?: A Lot Help from another person bathing (including washing, rinsing, drying)?: A Lot Help from another person to put on and taking off regular upper body clothing?: A Lot Help from another person to put on and taking off regular lower body clothing?: A Lot 6 Click Score: 14   End of Session Nurse Communication: Mobility status  Activity Tolerance: Patient tolerated treatment well Patient left: in bed;with call bell/phone within reach  OT Visit Diagnosis: Muscle weakness (generalized) (M62.81);Repeated falls (R29.6);History of falling (Z91.81);Unsteadiness on feet (R26.81)                Time: 1610-9604 OT Time Calculation (min): 22 min Charges:  OT General Charges $OT Visit: 1 Visit OT Evaluation $OT Eval Moderate Complexity: 1 Mod   Angus Amini D 07/04/2023, 11:17 AM

## 2023-07-04 NOTE — Progress Notes (Signed)
Ref: Assunta Found, MD   Subjective:  Resting comfortably. Small improvement in hyponatremia. Hypokalemia is improved/resolved. Increased urine output with spironolactone use. C/O arthritic pain in right hand and fingers/nurse..  Objective:  Vital Signs in the last 24 hours: Temp:  [97.9 F (36.6 C)-98.1 F (36.7 C)] 98 F (36.7 C) (07/07 0713) Pulse Rate:  [67] 67 (07/07 1107) Cardiac Rhythm: Normal sinus rhythm (07/07 0700) Resp:  [13-20] 15 (07/07 0713) BP: (114-135)/(56-71) 127/61 (07/07 1107) SpO2:  [95 %-100 %] 95 % (07/07 0713) Weight:  [116.8 kg] 116.8 kg (07/07 0457)  Physical Exam: BP Readings from Last 1 Encounters:  07/04/23 127/61     Wt Readings from Last 1 Encounters:  07/04/23 116.8 kg    Weight change: -3.4 kg Body mass index is 36.95 kg/m. HEENT: [place]/AT, Eyes-Blue, Conjunctiva-Pink, Sclera-Non-icteric Neck: No JVD, No bruit, Trachea midline. Lungs:  Clearing, Bilateral. Cardiac:  Regular rhythm, normal S1 and S2, no S3. III/VI systolic murmur. Abdomen:  Soft, non-tender. BS present. Extremities:  Trace edema present. No cyanosis. No clubbing. CNS: AxOx3, Cranial nerves grossly intact, moves all 4 extremities.  Skin: Warm and dry.   Intake/Output from previous day: 07/06 0701 - 07/07 0700 In: 2361 [P.O.:1100; I.V.:1160.8; IV Piggyback:100.2] Out: 5101 [Urine:5100; Stool:1]    Lab Results: BMET    Component Value Date/Time   NA 127 (L) 07/04/2023 0031   NA 126 (L) 07/03/2023 0029   NA 123 (L) 07/02/2023 0658   NA 141 09/05/2021 0000   NA 140 08/20/2021 0000   K 4.2 07/04/2023 0031   K 3.5 07/03/2023 0029   K 2.8 (L) 07/02/2023 0658   CL 92 (L) 07/04/2023 0031   CL 89 (L) 07/03/2023 0029   CL 85 (L) 07/02/2023 0658   CO2 24 07/04/2023 0031   CO2 28 07/03/2023 0029   CO2 25 07/02/2023 0658   GLUCOSE 96 07/04/2023 0031   GLUCOSE 91 07/03/2023 0029   GLUCOSE 93 07/02/2023 0658   BUN 35 (H) 07/04/2023 0031   BUN 38 (H) 07/03/2023 0029    BUN 36 (H) 07/02/2023 0658   BUN 19 09/05/2021 0000   BUN 31 (A) 08/20/2021 0000   CREATININE 1.65 (H) 07/04/2023 0031   CREATININE 2.16 (H) 07/03/2023 0029   CREATININE 1.77 (H) 07/02/2023 0658   CALCIUM 8.6 (L) 07/04/2023 0031   CALCIUM 8.6 (L) 07/03/2023 0029   CALCIUM 8.7 (L) 07/02/2023 0658   GFRNONAA 38 (L) 07/04/2023 0031   GFRNONAA 27 (L) 07/03/2023 0029   GFRNONAA 35 (L) 07/02/2023 0658   GFRAA 52.44 09/05/2021 0000   GFRAA 44.28 08/20/2021 0000   GFRAA 48 (L) 03/17/2016 2018   CBC    Component Value Date/Time   WBC 6.9 07/04/2023 0031   RBC 2.96 (L) 07/04/2023 0031   HGB 9.6 (L) 07/04/2023 0031   HCT 28.1 (L) 07/04/2023 0031   PLT 157 07/04/2023 0031   MCV 94.9 07/04/2023 0031   MCH 32.4 07/04/2023 0031   MCHC 34.2 07/04/2023 0031   RDW 15.3 07/04/2023 0031   LYMPHSABS 0.4 (L) 07/01/2023 1853   MONOABS 0.6 07/01/2023 1853   EOSABS 0.0 07/01/2023 1853   BASOSABS 0.0 07/01/2023 1853   HEPATIC Function Panel Recent Labs    07/01/23 1853  PROT 7.3  ALBUMIN 3.9  AST 37  ALT 11  ALKPHOS 49   HEMOGLOBIN A1C Lab Results  Component Value Date   MPG 105.41 11/12/2021   CARDIAC ENZYMES Lab Results  Component Value Date  CKTOTAL 91 07/15/2021   BNP No results for input(s): "PROBNP" in the last 8760 hours. TSH No results for input(s): "TSH" in the last 8760 hours. CHOLESTEROL No results for input(s): "CHOL" in the last 8760 hours.  Scheduled Meds:  celecoxib  100 mg Oral BID   Chlorhexidine Gluconate Cloth  6 each Topical Daily   heparin  5,000 Units Subcutaneous Q8H   metoprolol tartrate  12.5 mg Oral BID   multivitamin with minerals  1 tablet Oral Daily   pantoprazole  40 mg Oral Daily   polyethylene glycol  17 g Oral Daily   potassium chloride  10 mEq Oral BID   simvastatin  20 mg Oral QHS   spironolactone  12.5 mg Oral Daily   Continuous Infusions:  sodium chloride 50 mL/hr at 07/04/23 0909   cefTRIAXone (ROCEPHIN)  IV Stopped (07/03/23  1110)   PRN Meds:.magnesium hydroxide  Assessment/Plan: Bilateral lower extremities cellulitis Dehydration, improving Abnormal troponin I from demand ischemia Acute renal failure Severe hyponatremia Moderate hypokalemia CAD PVD Obesity Possible SIADH  Plan: Add celecoxib for arthritic pain. Decrease potassium supplement. Increase activity.   LOS: 3 days   Time spent including chart review, lab review, examination, discussion with patient/Nurse : 30 min   Orpah Cobb  MD  07/04/2023, 11:12 AM

## 2023-07-04 NOTE — Plan of Care (Signed)
  Problem: Education: Goal: Knowledge of General Education information will improve Description Including pain rating scale, medication(s)/side effects and non-pharmacologic comfort measures Outcome: Progressing   Problem: Health Behavior/Discharge Planning: Goal: Ability to manage health-related needs will improve Outcome: Progressing   Problem: Clinical Measurements: Goal: Ability to maintain clinical measurements within normal limits will improve Outcome: Progressing Goal: Will remain free from infection Outcome: Progressing Goal: Diagnostic test results will improve Outcome: Progressing Goal: Respiratory complications will improve Outcome: Progressing Goal: Cardiovascular complication will be avoided Outcome: Progressing   Problem: Activity: Goal: Risk for activity intolerance will decrease Outcome: Progressing   Problem: Nutrition: Goal: Adequate nutrition will be maintained Outcome: Progressing   Problem: Elimination: Goal: Will not experience complications related to bowel motility Outcome: Progressing Goal: Will not experience complications related to urinary retention Outcome: Progressing   Problem: Pain Managment: Goal: General experience of comfort will improve Outcome: Progressing   Problem: Safety: Goal: Ability to remain free from injury will improve Outcome: Progressing   

## 2023-07-04 NOTE — NC FL2 (Signed)
[place] MEDICAID FL2 LEVEL OF CARE FORM     IDENTIFICATION  Patient Name: Caleb [name] Birthdate: 10/11/1925 Sex: male Admission Date (Current Location): 07/01/2023  [facility] and [place] Number:  Producer, television/film/video and Address:  [facility] [place], [address] [place], [place] [address]      Provider Number: [id number]  Attending Physician Name and Address:  Orpah Cobb, MD  Relative Name and Phone Number:  Chrissie Noa [phone]) [phone]    Current Level of Care: Hospital Recommended Level of Care: Skilled Nursing Facility Prior Approval Number:    Date Approved/Denied:   PASRR Number: [id number]  Discharge Plan: SNF    Current Diagnoses: Patient Active Problem List   Diagnosis Date Noted   ACS (acute coronary syndrome) (HCC) 07/01/2023   Foley catheter problem (HCC) 02/02/2022   AKI (acute kidney injury) (HCC) 07/31/2021   Multiple duodenal ulcers 07/31/2021   Neurocognitive deficits 07/31/2021   Hematemesis 07/19/2021   Hyperkalemia 07/19/2021   Leukocytosis 07/19/2021   SIRS (systemic inflammatory response syndrome) (HCC) 07/19/2021   Acute blood loss anemia 07/19/2021   Transient hypotension 07/19/2021   GIB (gastrointestinal bleeding) 07/19/2021   Antiplatelet or antithrombotic long-term use    CKD (chronic kidney disease), stage III (HCC) 07/17/2021   Macrocytic anemia 07/17/2021   Atrial fibrillation (HCC) 07/17/2021   Pressure ulcers of skin of multiple topographic sites 07/17/2021   Rhabdomyolysis 07/11/2021   Fall at home, initial encounter 07/11/2021   Acute lower UTI 07/11/2021   Urinary retention 03/27/2020   Exudative age-related macular degeneration, right eye, with active choroidal neovascularization (HCC) 08/23/2017   BMI 32.0-32.9,adult 02/26/2016   Neurogenic bladder 02/26/2016   Scalp hematoma 01/13/2016   Self-catheterizes urinary bladder 01/13/2016   Pneumothorax, left 01/12/2016   Atypical chest pain 12/02/2015    Muscular deconditioning 02/20/2015   History of penile cancer 01/09/2015   Cellulitis 05/17/2014   Pain in limb- Slight pain- Left Leg 05/03/2014   Discoloration of skin-Left Leg 05/03/2014   Stasis edema with ulcer (HCC) 08/17/2013   Peripheral vascular disease, unspecified (HCC) 07/13/2013   Swelling of limb 07/13/2013   Coronary artery disease 06/07/2013   Left upper extremity numbness 06/07/2013   Hyperlipidemia    Knee pain 08/11/2011    Orientation RESPIRATION BLADDER Height & Weight     Self, Time, Situation, Place  Normal Continent (Urethral Catheter) Weight: 257 lb 8 oz (116.8 kg) Height:  5\' 10"  (177.8 cm)  BEHAVIORAL SYMPTOMS/MOOD NEUROLOGICAL BOWEL NUTRITION STATUS      Continent Diet (Please see discharge summary)  AMBULATORY STATUS COMMUNICATION OF NEEDS Skin   Extensive Assist Verbally Other (Comment) (Abrasion,leg,lower,Bil.,Ecchymosis,leg,arm,Bil.,upper,lower,Erythema,abdomen,Groin,Leg,Bil.,R,Lower,Scratch marks,arm,leg,Bil.,Lower,Barrier Cream)                       Personal Care Assistance Level of Assistance  Bathing, Feeding, Dressing Bathing Assistance: Maximum assistance Feeding assistance: Limited assistance Dressing Assistance: Maximum assistance     Functional Limitations Info  Sight, Hearing, Speech Sight Info: Impaired (Reading glasses) Hearing Info: Adequate Speech Info: Adequate    SPECIAL CARE FACTORS FREQUENCY  OT (By licensed OT), PT (By licensed PT)     PT Frequency: 5x min weekly OT Frequency: 5x min weekly            Contractures Contractures Info: Not present    Additional Factors Info  Code Status, Allergies Code Status Info: FULL Code Allergies Info: NKA           Current Medications (  07/04/2023):  This is the current hospital active medication list Current Facility-Administered Medications  Medication Dose Route Frequency Provider Last Rate Last Admin   0.9 %  sodium chloride infusion   Intravenous Continuous  Orpah Cobb, MD 50 mL/hr at 07/04/23 0909 Infusion Verify at 07/04/23 0909   cefTRIAXone (ROCEPHIN) 1 g in sodium chloride 0.9 % 100 mL IVPB  1 g Intravenous Q24H Orpah Cobb, MD 200 mL/hr at 07/04/23 1113 1 g at 07/04/23 1113   celecoxib (CELEBREX) capsule 100 mg  100 mg Oral BID Orpah Cobb, MD   100 mg at 07/04/23 1107   Chlorhexidine Gluconate Cloth 2 % PADS 6 each  6 each Topical Daily Orpah Cobb, MD   6 each at 07/04/23 0913   heparin injection 5,000 Units  5,000 Units Subcutaneous Q8H Orpah Cobb, MD       magnesium hydroxide (MILK OF MAGNESIA) suspension 30 mL  30 mL Oral Daily PRN Orpah Cobb, MD   30 mL at 07/02/23 1749   metoprolol tartrate (LOPRESSOR) tablet 12.5 mg  12.5 mg Oral BID Orpah Cobb, MD   12.5 mg at 07/04/23 1107   multivitamin with minerals tablet 1 tablet  1 tablet Oral Daily Orpah Cobb, MD   1 tablet at 07/04/23 0912   pantoprazole (PROTONIX) EC tablet 40 mg  40 mg Oral Daily Orpah Cobb, MD   40 mg at 07/04/23 0912   polyethylene glycol (MIRALAX / GLYCOLAX) packet 17 g  17 g Oral Daily Orpah Cobb, MD       potassium chloride (KLOR-CON M) CR tablet 10 mEq  10 mEq Oral BID Orpah Cobb, MD       simvastatin (ZOCOR) tablet 20 mg  20 mg Oral QHS Orpah Cobb, MD   20 mg at 07/03/23 2123   spironolactone (ALDACTONE) tablet 12.5 mg  12.5 mg Oral Daily Orpah Cobb, MD   12.5 mg at 07/04/23 1107     Discharge Medications: Please see discharge summary for a list of discharge medications.  Relevant Imaging Results:  Relevant Lab Results:   Additional Information [id number]  Delilah Shan, LCSWA

## 2023-07-04 NOTE — Progress Notes (Signed)
ANTICOAGULATION CONSULT NOTE  Pharmacy Consult for Heparin Indication: chest pain/ACS/ R/O VTE  No Known Allergies  Patient Measurements: Height: 5\' 10"  (177.8 cm) Weight: 116.8 kg (257 lb 8 oz) IBW/kg (Calculated) : 73 Heparin Dosing Weight: 106 kg  Vital Signs: Temp: 98 F (36.7 C) (07/07 0713) Temp Source: Oral (07/07 0713) BP: 123/71 (07/07 0713)  Labs: Recent Labs    07/01/23 1853 07/01/23 2100 07/02/23 0658 07/02/23 1440 07/03/23 0029 07/04/23 0031 07/04/23 0824  HGB 11.5*  --  10.5*  --  10.2* 9.6*  --   HCT 33.1*  --  29.9*  --  29.0* 28.1*  --   PLT 206  --  190  --  159 157  --   APTT 28  --   --   --   --   --   --   LABPROT 14.2  --   --   --   --   --   --   INR 1.1  --   --   --   --   --   --   HEPARINUNFRC  --   --  0.46   < > 0.45 0.21* 0.40  CREATININE 2.11*  --  1.77*  --  2.16* 1.65*  --   TROPONINIHS 635* 573*  --   --   --   --   --    < > = values in this interval not displayed.     Estimated Creatinine Clearance: 33.5 mL/min (A) (by C-G formula based on SCr of 1.65 mg/dL (H)).   Medical History: Past Medical History:  Diagnosis Date   Coronary artery disease    Hyperlipidemia    Obesity    Penile cancer (HCC)    Swelling of extremity    Left Leg    Assessment: 89 y.o. M presents with weakness and fall. Found to have troponin 635. To begin heparin for r/o ACS. No AC PTA. Preliminary LE doppler negative for DVT.  Heparin level remains therapeutic (0.4) on heparin infusion at 1500 units/hr. No bleeding noted. CBC stable  Heparin drip stopped by MD> VTE px   Goal of Therapy:  Heparin level 0.3-0.7 units/ml Monitor platelets by anticoagulation protocol: Yes   Plan:  Stop heparin drip> SQ tid for vte px Monitor cbc     Leota Sauers Pharm.D. CPP, BCPS Clinical Pharmacist [phone] 07/04/2023 10:44 AM   Please see amion for complete clinical pharmacist phone list 07/04/2023 10:44 AM

## 2023-07-05 LAB — COMPREHENSIVE METABOLIC PANEL
ALT: 13 U/L (ref 0–44)
AST: 17 U/L (ref 15–41)
Albumin: 3 g/dL — ABNORMAL LOW (ref 3.5–5.0)
Alkaline Phosphatase: 49 U/L (ref 38–126)
Anion gap: 7 (ref 5–15)
BUN: 27 mg/dL — ABNORMAL HIGH (ref 8–23)
CO2: 25 mmol/L (ref 22–32)
Calcium: 8.6 mg/dL — ABNORMAL LOW (ref 8.9–10.3)
Chloride: 95 mmol/L — ABNORMAL LOW (ref 98–111)
Creatinine, Ser: 1.83 mg/dL — ABNORMAL HIGH (ref 0.61–1.24)
GFR, Estimated: 33 mL/min — ABNORMAL LOW (ref >60)
Glucose, Bld: 102 mg/dL — ABNORMAL HIGH (ref 70–99)
Potassium: 4.7 mmol/L (ref 3.5–5.1)
Sodium: 127 mmol/L — ABNORMAL LOW (ref 135–145)
Total Bilirubin: 0.6 mg/dL (ref 0.3–1.2)
Total Protein: 5.8 g/dL — ABNORMAL LOW (ref 6.5–8.1)

## 2023-07-05 MED ORDER — POTASSIUM CHLORIDE CRYS ER 10 MEQ PO TBCR
10.0000 meq | EXTENDED_RELEASE_TABLET | Freq: Every day | ORAL | Status: DC
  Administered 2023-07-06 – 2023-07-07 (×2): 10 meq via ORAL
  Filled 2023-07-05 (×2): qty 1

## 2023-07-05 NOTE — Progress Notes (Signed)
Physical Therapy Treatment Patient Details Name: Caleb [name] MRN: [id number] DOB: [birth year] Today's Date: 07/05/2023   History of Present Illness 89 years old white male admitted 7/4 after being  found on his bedroom floor by his live-in caregiver.  Pt with  2 day h/o weakness and fall x 2.   Pt with dehydration and r/o DVT.  Also w/u for ACS.  PMH of CAD, HLD, PVD, Venous stasis dermatitis and obesity    PT Comments  Pt received in supine and agreeable to session. Pt demonstrating improved mobility this session requiring less assistance and is able to stand to stedy. Pt able to advance BLE to EOB without assist. Pt able to stand from EOB x2 with slightly lower elevation for the second trial. Pt demonstrating improved power up this session, requiring up to min A +2. Pt requiring increased assist to sit to recliner for controlled descent due to decreased B knee flexion despite cues. Pt continues to benefit from PT services to progress toward functional mobility goals.     Assistance Recommended at Discharge Frequent or constant Supervision/Assistance  If plan is discharge home, recommend the following:  Can travel by private vehicle    Two people to help with walking and/or transfers;Two people to help with bathing/dressing/bathroom;Assistance with cooking/housework;Assist for transportation;Help with stairs or ramp for entrance   No  Equipment Recommendations  None recommended by PT    Recommendations for Other Services       Precautions / Restrictions Precautions Precautions: Fall Restrictions Weight Bearing Restrictions: No     Mobility  Bed Mobility Overal bed mobility: Needs Assistance Bed Mobility: Supine to Sit     Supine to sit: Min assist, HOB elevated     General bed mobility comments: min A for trunk elevation and scooting forward at EOB with use of bedpad    Transfers Overall transfer level: Needs assistance Equipment used: Ambulation equipment used  (stedy) Transfers: Sit to/from Stand, Bed to chair/wheelchair/BSC Sit to Stand: From elevated surface, Min assist, +2 physical assistance           General transfer comment: Pt able to stand to stedy from elevated EOB with min A +2 and from stedy paddles with min guard. Cues for anterior lean. Pt with decreased B knee flexion with sitting requiring mod A for controlled descent. Transfer via Lift Equipment: Stedy  Ambulation/Gait             Pre-gait activities: marching in stedy        Balance Overall balance assessment: Needs assistance Sitting-balance support: No upper extremity supported, Feet supported Sitting balance-Leahy Scale: Fair Sitting balance - Comments: sitting EOB   Standing balance support: Bilateral upper extremity supported, During functional activity, Reliant on assistive device for balance Standing balance-Leahy Scale: Poor Standing balance comment: with stedy support                            Cognition Arousal/Alertness: Awake/alert Behavior During Therapy: WFL for tasks assessed/performed Overall Cognitive Status: Within Functional Limits for tasks assessed                                 General Comments: Pt repeating himself and requiring increased cues at times        Exercises      General Comments General comments (skin integrity, edema, etc.): VSS      Pertinent  Vitals/Pain Pain Assessment Pain Assessment: Faces Faces Pain Scale: Hurts a little bit Pain Location: BLE Pain Descriptors / Indicators: Other (Comment) (sensitive to touch) Pain Intervention(s): Monitored during session     PT Goals (current goals can now be found in the care plan section) Acute Rehab PT Goals Patient Stated Goal: to get Rehab PT Goal Formulation: With patient Time For Goal Achievement: 07/17/23 Potential to Achieve Goals: Good Progress towards PT goals: Progressing toward goals    Frequency    Min 1X/week       PT Plan Current plan remains appropriate       AM-PAC PT "6 Clicks" Mobility   Outcome Measure  Help needed turning from your back to your side while in a flat bed without using bedrails?: A Little Help needed moving from lying on your back to sitting on the side of a flat bed without using bedrails?: A Little Help needed moving to and from a bed to a chair (including a wheelchair)?: Total Help needed standing up from a chair using your arms (e.g., wheelchair or bedside chair)?: A Lot Help needed to walk in hospital room?: Total Help needed climbing 3-5 steps with a railing? : Total 6 Click Score: 11    End of Session Equipment Utilized During Treatment: Gait belt Activity Tolerance: Patient limited by fatigue Patient left: in chair;with chair alarm set;with call bell/phone within reach Nurse Communication: Mobility status PT Visit Diagnosis: Unsteadiness on feet (R26.81);Muscle weakness (generalized) (M62.81)     Time: 1610-9604 PT Time Calculation (min) (ACUTE ONLY): 25 min  Charges:    $Therapeutic Activity: 23-37 mins PT General Charges $$ ACUTE PT VISIT: 1 Visit                     Johny Shock, PTA Acute Rehabilitation Services Secure Chat Preferred  [phone]    Johny Shock 07/05/2023, 11:23 AM

## 2023-07-05 NOTE — Care Management Important Message (Signed)
Important Message  Patient Details  Name: Caleb [name] MRN: [id number] Date of Birth: 1925-01-24   Medicare Important Message Given:  Yes     Renie Ora 07/05/2023, 8:20 AM

## 2023-07-05 NOTE — Progress Notes (Signed)
Ref: Assunta Found, MD   Subjective:  Awake. Feeling better. Had new dressing on both lower legs. Tolerated sitting up in chair.  Objective:  Vital Signs in the last 24 hours: Temp:  [97.4 F (36.3 C)-98.1 F (36.7 C)] 97.5 F (36.4 C) (07/08 1600) Pulse Rate:  [66-76] 74 (07/08 1600) Cardiac Rhythm: Normal sinus rhythm;Bundle branch block (07/08 1941) Resp:  [16-18] 18 (07/08 1600) BP: (104-143)/(66-90) 138/76 (07/08 1600) SpO2:  [95 %-99 %] 95 % (07/08 1600) Weight:  [161 kg] 117 kg (07/08 0410)  Physical Exam: BP Readings from Last 1 Encounters:  07/05/23 138/76     Wt Readings from Last 1 Encounters:  07/05/23 117 kg    Weight change: 0.2 kg Body mass index is 37.01 kg/m. HEENT: [place]/AT, Eyes-Blue, Conjunctiva-Pink, Sclera-Non-icteric Neck: No JVD, No bruit, Trachea midline. Lungs:  Clearing, Bilateral. Cardiac:  Regular rhythm, normal S1 and S2, no S3. II/VI systolic murmur. Abdomen:  Soft, non-tender. BS present. Extremities:  Trace edema present. No cyanosis. No clubbing. CNS: AxOx3, Cranial nerves grossly intact, moves all 4 extremities.  Skin: Warm and dry.   Intake/Output from previous day: 07/07 0701 - 07/08 0700 In: 2310.1 [P.O.:840; I.V.:1370.1; IV Piggyback:100] Out: 5720 [Urine:5720]    Lab Results: BMET    Component Value Date/Time   NA 127 (L) 07/05/2023 0036   NA 127 (L) 07/04/2023 0031   NA 126 (L) 07/03/2023 0029   NA 141 09/05/2021 0000   NA 140 08/20/2021 0000   K 4.7 07/05/2023 0036   K 4.2 07/04/2023 0031   K 3.5 07/03/2023 0029   CL 95 (L) 07/05/2023 0036   CL 92 (L) 07/04/2023 0031   CL 89 (L) 07/03/2023 0029   CO2 25 07/05/2023 0036   CO2 24 07/04/2023 0031   CO2 28 07/03/2023 0029   GLUCOSE 102 (H) 07/05/2023 0036   GLUCOSE 96 07/04/2023 0031   GLUCOSE 91 07/03/2023 0029   BUN 27 (H) 07/05/2023 0036   BUN 35 (H) 07/04/2023 0031   BUN 38 (H) 07/03/2023 0029   BUN 19 09/05/2021 0000   BUN 31 (A) 08/20/2021 0000    CREATININE 1.83 (H) 07/05/2023 0036   CREATININE 1.65 (H) 07/04/2023 0031   CREATININE 2.16 (H) 07/03/2023 0029   CALCIUM 8.6 (L) 07/05/2023 0036   CALCIUM 8.6 (L) 07/04/2023 0031   CALCIUM 8.6 (L) 07/03/2023 0029   GFRNONAA 33 (L) 07/05/2023 0036   GFRNONAA 38 (L) 07/04/2023 0031   GFRNONAA 27 (L) 07/03/2023 0029   GFRAA 52.44 09/05/2021 0000   GFRAA 44.28 08/20/2021 0000   GFRAA 48 (L) 03/17/2016 2018   CBC    Component Value Date/Time   WBC 6.9 07/04/2023 0031   RBC 2.96 (L) 07/04/2023 0031   HGB 9.6 (L) 07/04/2023 0031   HCT 28.1 (L) 07/04/2023 0031   PLT 157 07/04/2023 0031   MCV 94.9 07/04/2023 0031   MCH 32.4 07/04/2023 0031   MCHC 34.2 07/04/2023 0031   RDW 15.3 07/04/2023 0031   LYMPHSABS 0.4 (L) 07/01/2023 1853   MONOABS 0.6 07/01/2023 1853   EOSABS 0.0 07/01/2023 1853   BASOSABS 0.0 07/01/2023 1853   HEPATIC Function Panel Recent Labs    07/01/23 1853 07/05/23 0036  PROT 7.3 5.8*  ALBUMIN 3.9 3.0*  AST 37 17  ALT 11 13  ALKPHOS 49 49   HEMOGLOBIN A1C Lab Results  Component Value Date   MPG 105.41 11/12/2021   CARDIAC ENZYMES Lab Results  Component Value Date  CKTOTAL 91 07/15/2021   BNP No results for input(s): "PROBNP" in the last 8760 hours. TSH No results for input(s): "TSH" in the last 8760 hours. CHOLESTEROL No results for input(s): "CHOL" in the last 8760 hours.  Scheduled Meds:  celecoxib  100 mg Oral BID   Chlorhexidine Gluconate Cloth  6 each Topical Daily   heparin  5,000 Units Subcutaneous Q8H   metoprolol tartrate  12.5 mg Oral BID   multivitamin with minerals  1 tablet Oral Daily   pantoprazole  40 mg Oral Daily   polyethylene glycol  17 g Oral Daily   [START ON 07/06/2023] potassium chloride  10 mEq Oral Daily   simvastatin  20 mg Oral QHS   spironolactone  12.5 mg Oral Daily   Continuous Infusions:  sodium chloride 50 mL/hr at 07/05/23 1921   cefTRIAXone (ROCEPHIN)  IV Stopped (07/05/23 1119)   PRN Meds:.magnesium  hydroxide  Assessment/Plan: Bilateral lower extremities cellulitis Dehydration Abnormal troponin I from demand ischemia Acute renal failure Severe hyponatremia Moderate hypokalemia CAD PVD Obesity Possible SIADH  Plan: Rehab consult.   LOS: 4 days   Time spent including chart review, lab review, examination, discussion with patient/Nurse : 30 min   Orpah Cobb  MD  07/05/2023, 11:24 PM

## 2023-07-05 NOTE — Consult Note (Signed)
   [facility] Inpatient Consult   07/05/2023  Caleb [name] [birth year] [id number]  [facility] [[facility]]  Accountable Care Organization [ACO] Patient: Medicare ACO REACH  Primary Care Provider:  Assunta Found, MD With [facility]  Patient screened for hospitalization with the inpatient [facility] team to assess for potential [facility]  [[facility]] Care Management service needs for post hospital community transition for care coordination.  Review of patient's electronic medical record reveals patient is currently per therapy notes being considered for an inpatient rehabilitation transition CIR when medically read.  PT/OT notes reviewed.  Met with patient in room, patient is up in bedside chair eating lunch. Patient endorses Elfredia Nevins as his PCP but both providers are at [facility].  Left a bedside reminder appointment follow up card for post hospital follow up with PCP states, "I have 2 sons, " left information for sons."  Placed on the bedside table.   Plan:  Continue to follow progress and disposition to assess for post hospital community care coordination/management needs.  Referral request for community care coordination:  pending progress and disposition for post hospital community needs.  Of note, [facility] Care Management/Population Health does not replace or interfere with any arrangements made by the Inpatient Transition of Care team.  For questions contact:   Charlesetta Shanks, RN BSN CCM [facility]  [phone] business mobile phone Toll free office [phone]  *Concierge Line  [phone] Fax number: [phone] [email or web address]

## 2023-07-05 NOTE — TOC Progression Note (Addendum)
Transition of Care [facility]) - Progression Note    Patient Details  Name: Caleb [name] MRN: [id number] Date of Birth: March 24, 1925  Transition of Care [facility]) CM/SW Contact  Caleb [name], LCSWA Phone Number: 07/05/2023, 12:44 PM  Clinical Narrative:     Update- 2:24pm- CSW received call from Caleb [name] patients son who informed CSW that he spoke with his father who has now decided that he does not want to go to [facility] and is interested in CIR as first choice and [facility] as second choice. Patients son informed CSW that patient gave permission to fax out to [facility] area for SNF placement as back up plan to CIR. CIR currently following patient to see if candidate for CIR. CSW spoke with patient who confirmed would like CIR and SNF as back up plan. Patient gave permission to fax out to [facility] area for SNF as back up plan.Patient request for CSW to follow back up with his son Caleb [name] with SNF bed offers in [place]. CSW informed Caleb [name] with [facility]. CSW will continue to follow.   Update- 2:16pm- Caleb [name] with [facility] did confirm SNF bed for patient when patient is medically ready.   CSW provided patient with SNF bed offers. Patient accepted SNF bed offer with [facility]. CSW spoke with Caleb [name] with [facility] who is going to review referral and will call CSW on confirmation of bed offer. CSW will continue to follow and assist with patients dc planning needs.  Expected Discharge Plan: Skilled Nursing Facility Barriers to Discharge: Continued Medical Work up  Expected Discharge Plan and Services In-house Referral: Clinical Social Work     Living arrangements for the past 2 months: Single Family Home                                       Social Determinants of Health (SDOH) Interventions SDOH Screenings   Food Insecurity: No Food Insecurity (07/01/2023)  Housing: Low Risk  (07/01/2023)  Transportation Needs: No Transportation Needs (07/01/2023)   Utilities: Not At Risk (07/01/2023)  Tobacco Use: Medium Risk (07/01/2023)    Readmission Risk Interventions    07/15/2021    1:30 PM 07/14/2021   10:40 AM  Readmission Risk Prevention Plan  Post Dischage Appt Complete   Medication Screening  Complete  Transportation Screening  Complete

## 2023-07-05 NOTE — Consult Note (Signed)
WOC Nurse Consult Note: patient with history of PVD, venous stasis dermatitis.  Patient states he has had wounds on lower legs for years and is not followed by anyone outpatient  Reason for Consult: bilateral lower leg wounds  Wound type: 1.  Full thickness, likely traumatic  2.  Full thickness, likely venous in origin Pressure Injury POA: NA, not pressure related  Measurement: 1.  L anterior lower leg 4 cm x 1 cm full thickness, appears to be ruptured hematoma, 100% red bloody  2.  R anterior lower leg 3 cm x 1 cm x 0.1 cm full thickness 75% pink moist 25% yellow; likely venous in nature   Drainage (amount, consistency, odor) 1. Small amount Sanguinous to L lower leg, scant serosanguinous R lower leg  Periwound: both legs edematous with hemosiderin staining  Dressing procedure/placement/frequency: 1.  Clean L lower leg with NS, apply Xeroform gauze [place] [phone]) daily to wound bed, cover with ABD pad and wrap with Kerlix roll gauze.  2.  Clean R lower leg wound with NS, apply small piece of Silver Hydrofiber [place] [phone]) cut to fit wound bed daily.  Cover with ABD pad and wrap with Kerlix.     POC discussed with patient and bedside nurse. WOC team will not follow.  Re-consult if further needs arise.   Thank you,     Priscella Mann MSN, RN-BC, [employer] [phone]

## 2023-07-05 NOTE — Progress Notes (Signed)
Inpatient Rehab Admissions Coordinator:  ? ?Per therapy recommendations,  patient was screened for CIR candidacy by Janis Sol, MS, CCC-SLP. At this time, Pt. Appears to be a a potential candidate for CIR. I will place   order for rehab consult per protocol for full assessment. Please contact me any with questions. ? ?Tyrus Wilms, MS, CCC-SLP ?Rehab Admissions Coordinator  ?[phone] (celll) ?[phone] (office) ? ?

## 2023-07-06 NOTE — Progress Notes (Signed)
Occupational Therapy Treatment Patient Details Name: Caleb [name] MRN: [id number] DOB: 1925-03-11 Today's Date: 07/06/2023   History of present illness 89 years old white male admitted 7/4 after being  found on his bedroom floor by his live-in caregiver.  Pt with  2 day h/o weakness and fall x 2.   Pt with dehydration and r/o DVT.  Also w/u for ACS.  PMH of CAD, HLD, PVD, Venous stasis dermatitis and obesity   OT comments  Patient supine in bed upon arrival into room. Patient agreeable to complete OT treatment this date.  Patient reporting that he has a bowel movement and needed to be cleaned up.  Patient completed rolling L<>R with mod A and use of bed rails to maintain side lying positioning for peri hygiene with total A .  Patient completed UB grooming of washing face while supine in bed with HOB elevated with Supervision s/p setup and requires max A for using electric shaver to trim facial hair.Patient left supine in in bed with all needs within reach   Recommendations for follow up therapy are one component of a multi-disciplinary discharge planning process, led by the attending physician.  Recommendations may be updated based on patient status, additional functional criteria and insurance authorization.    Assistance Recommended at Discharge Frequent or constant Supervision/Assistance  Patient can return home with the following  A lot of help with walking and/or transfers;A lot of help with bathing/dressing/bathroom;Assistance with cooking/housework   Equipment Recommendations  BSC/3in1;Toilet riser    Recommendations for Other Services      Precautions / Restrictions Precautions Precautions: Fall Restrictions Weight Bearing Restrictions: No       Mobility Bed Mobility Overal bed mobility: Needs Assistance Bed Mobility: Rolling Rolling: Mod assist              Transfers                         Balance                                            ADL either performed or assessed with clinical judgement   ADL Overall ADL's : Independent Eating/Feeding: Minimal assistance;Bed level Eating/Feeding Details (indicate cue type and reason): HOB raised and items opening for pt on tray Grooming: Wash/dry face;Set up (electic razor use max A) Grooming Details (indicate cue type and reason): HOB raised     Lower Body Bathing: Total assistance (fpr peri hygiene post bowel movement)                              Extremity/Trunk Assessment Upper Extremity Assessment Upper Extremity Assessment: Generalized weakness            Vision       Perception     Praxis      Cognition Arousal/Alertness: Awake/alert Behavior During Therapy: WFL for tasks assessed/performed Overall Cognitive Status: Within Functional Limits for tasks assessed                                 General Comments: Pt repeating himself and requiring increased cues at times        Exercises      Shoulder Instructions  General Comments      Pertinent Vitals/ Pain       Pain Assessment Pain Assessment: 0-10 Pain Score: 3  Faces Pain Scale: Hurts a little bit Pain Location:  (heels of b/l feet) Pain Descriptors / Indicators: Discomfort Pain Intervention(s): Monitored during session (heels floated at end of treatment session)  Home Living                                          Prior Functioning/Environment              Frequency  Min 2X/week        Progress Toward Goals  OT Goals(current goals can now be found in the care plan section)  Progress towards OT goals: Progressing toward goals  Acute Rehab OT Goals OT Goal Formulation: With patient Time For Goal Achievement: 07/18/23 Potential to Achieve Goals: Good ADL Goals Pt Will Perform Grooming: with min assist;sitting Pt Will Transfer to Toilet: with mod assist;stand pivot transfer Pt Will Perform Toileting - Clothing  Manipulation and hygiene: with mod assist;sit to/from stand;sitting/lateral leans  Plan Discharge plan remains appropriate    Co-evaluation                 AM-PAC OT "6 Clicks" Daily Activity     Outcome Measure   Help from another person eating meals?: A Little Help from another person taking care of personal grooming?: A Little Help from another person toileting, which includes using toliet, bedpan, or urinal?: A Lot Help from another person bathing (including washing, rinsing, drying)?: A Lot Help from another person to put on and taking off regular upper body clothing?: A Lot Help from another person to put on and taking off regular lower body clothing?: A Lot 6 Click Score: 14    End of Session    OT Visit Diagnosis: Muscle weakness (generalized) (M62.81);Repeated falls (R29.6);History of falling (Z91.81);Unsteadiness on feet (R26.81)   Activity Tolerance Patient tolerated treatment well   Patient Left in bed;with call bell/phone within reach   Nurse Communication Mobility status        Time: 4098-1191 OT Time Calculation (min): 34 min  Charges: OT General Charges $OT Visit: 1 Visit OT Treatments $Therapeutic Activity: 23-37 mins  Governor Specking OT/L Denice Paradise 07/06/2023, 3:44 PM

## 2023-07-06 NOTE — Progress Notes (Signed)
Ref: Assunta Found, MD   Subjective:  Awake. VS stable.  Objective:  Vital Signs in the last 24 hours: Temp:  [97.9 F (36.6 C)-98.4 F (36.9 C)] 98 F (36.7 C) (07/09 2026) Pulse Rate:  [68-78] 73 (07/09 2026) Cardiac Rhythm: Heart block (07/09 1900) Resp:  [14-16] 16 (07/09 1542) BP: (125-157)/(63-80) 125/63 (07/09 2026) SpO2:  [95 %-98 %] 95 % (07/09 2026) Weight:  [119.7 kg] 119.7 kg (07/09 0630)  Physical Exam: BP Readings from Last 1 Encounters:  07/06/23 125/63     Wt Readings from Last 1 Encounters:  07/06/23 119.7 kg    Weight change: 2.7 kg Body mass index is 37.86 kg/m. HEENT: [place]/AT, Eyes-Blue, Conjunctiva-Pink, Sclera-Non-icteric Neck: No JVD, No bruit, Trachea midline. Lungs:  Clearing, Bilateral. Cardiac:  Regular rhythm, normal S1 and S2, no S3. II/VI systolic murmur. Abdomen:  Soft, non-tender. BS present. Extremities:  Trace edema + bilateral lower leg venous stasis dermatitis is present. No cyanosis. No clubbing. CNS: AxOx3, Cranial nerves grossly intact, moves all 4 extremities.  Skin: Warm and dry.   Intake/Output from previous day: 07/08 0701 - 07/09 0700 In: 1902.6 [P.O.:780; I.V.:1022.6; IV Piggyback:100] Out: 1520 [Urine:1520]    Lab Results: BMET    Component Value Date/Time   NA 127 (L) 07/05/2023 0036   NA 127 (L) 07/04/2023 0031   NA 126 (L) 07/03/2023 0029   NA 141 09/05/2021 0000   NA 140 08/20/2021 0000   K 4.7 07/05/2023 0036   K 4.2 07/04/2023 0031   K 3.5 07/03/2023 0029   CL 95 (L) 07/05/2023 0036   CL 92 (L) 07/04/2023 0031   CL 89 (L) 07/03/2023 0029   CO2 25 07/05/2023 0036   CO2 24 07/04/2023 0031   CO2 28 07/03/2023 0029   GLUCOSE 102 (H) 07/05/2023 0036   GLUCOSE 96 07/04/2023 0031   GLUCOSE 91 07/03/2023 0029   BUN 27 (H) 07/05/2023 0036   BUN 35 (H) 07/04/2023 0031   BUN 38 (H) 07/03/2023 0029   BUN 19 09/05/2021 0000   BUN 31 (A) 08/20/2021 0000   CREATININE 1.83 (H) 07/05/2023 0036   CREATININE 1.65  (H) 07/04/2023 0031   CREATININE 2.16 (H) 07/03/2023 0029   CALCIUM 8.6 (L) 07/05/2023 0036   CALCIUM 8.6 (L) 07/04/2023 0031   CALCIUM 8.6 (L) 07/03/2023 0029   GFRNONAA 33 (L) 07/05/2023 0036   GFRNONAA 38 (L) 07/04/2023 0031   GFRNONAA 27 (L) 07/03/2023 0029   GFRAA 52.44 09/05/2021 0000   GFRAA 44.28 08/20/2021 0000   GFRAA 48 (L) 03/17/2016 2018   CBC    Component Value Date/Time   WBC 6.9 07/04/2023 0031   RBC 2.96 (L) 07/04/2023 0031   HGB 9.6 (L) 07/04/2023 0031   HCT 28.1 (L) 07/04/2023 0031   PLT 157 07/04/2023 0031   MCV 94.9 07/04/2023 0031   MCH 32.4 07/04/2023 0031   MCHC 34.2 07/04/2023 0031   RDW 15.3 07/04/2023 0031   LYMPHSABS 0.4 (L) 07/01/2023 1853   MONOABS 0.6 07/01/2023 1853   EOSABS 0.0 07/01/2023 1853   BASOSABS 0.0 07/01/2023 1853   HEPATIC Function Panel Recent Labs    07/01/23 1853 07/05/23 0036  PROT 7.3 5.8*  ALBUMIN 3.9 3.0*  AST 37 17  ALT 11 13  ALKPHOS 49 49   HEMOGLOBIN A1C Lab Results  Component Value Date   MPG 105.41 11/12/2021   CARDIAC ENZYMES Lab Results  Component Value Date   CKTOTAL 91 07/15/2021   BNP No  results for input(s): "PROBNP" in the last 8760 hours. TSH No results for input(s): "TSH" in the last 8760 hours. CHOLESTEROL No results for input(s): "CHOL" in the last 8760 hours.  Scheduled Meds:  celecoxib  100 mg Oral BID   Chlorhexidine Gluconate Cloth  6 each Topical Daily   heparin  5,000 Units Subcutaneous Q8H   metoprolol tartrate  12.5 mg Oral BID   multivitamin with minerals  1 tablet Oral Daily   pantoprazole  40 mg Oral Daily   polyethylene glycol  17 g Oral Daily   potassium chloride  10 mEq Oral Daily   simvastatin  20 mg Oral QHS   spironolactone  12.5 mg Oral Daily   Continuous Infusions:  sodium chloride 50 mL/hr at 07/06/23 1606   cefTRIAXone (ROCEPHIN)  IV 1 g (07/06/23 1221)   PRN Meds:.magnesium hydroxide  Assessment/Plan: Bilateral lower extremities  cellulitis Dehydration Abnormal troponin I from demand ischemia Acute renal failure Severe hyponatremia Moderate hypokalemia CAD PVD Obesity Possible SIADH  Plan: Continue medical treatment.   LOS: 5 days   Time spent including chart review, lab review, examination, discussion with patient :  min   Orpah Cobb  MD  07/06/2023, 8:47 PM

## 2023-07-06 NOTE — TOC Progression Note (Signed)
Transition of Care [facility]) - Progression Note    Patient Details  Name: Caleb [name] MRN: [id number] Date of Birth: [birth year]  Transition [facility]) CM/SW Contact  Delilah Shan, LCSWA Phone Number: 07/06/2023, 2:27 PM  Clinical Narrative:      CSW received message from Vernona Rieger with Inpatient rehab who informed CSW that patients son wants patient to go to SNF and prefers [facility]. CSW spoke with patients son Caleb [name] who request for Vernona Rieger with inpatient rehab to give him a call back. Patients son confirmed he would like for patient to go to CIR when medically ready for dc. CSW informed Vernona Rieger with inpatient rehab.   Expected Discharge Plan: Skilled Nursing Facility Barriers to Discharge: Continued Medical Work up  Expected Discharge Plan and Services In-house Referral: Clinical Social Work     Living arrangements for the past 2 months: Single Family Home                                       Social Determinants of Health (SDOH) Interventions SDOH Screenings   Food Insecurity: No Food Insecurity (07/01/2023)  Housing: Low Risk  (07/01/2023)  Transportation Needs: No Transportation Needs (07/01/2023)  Utilities: Not At Risk (07/01/2023)  Tobacco Use: Medium Risk (07/01/2023)    Readmission Risk Interventions    07/15/2021    1:30 PM 07/14/2021   10:40 AM  Readmission Risk Prevention Plan  Post Dischage Appt Complete   Medication Screening  Complete  Transportation Screening  Complete

## 2023-07-06 NOTE — Progress Notes (Addendum)
Inpatient Rehab Admissions Coordinator:   14:40 addendum. I spoke again with pt.'s son Chrissie Noa who states that he wants to be considered for CIR. He states that current caregiver can provide supervision to very light min assist. I explained Pt. Is anticipated to need 24/7 physical assist (min assist) and that if the caregiver cannot provide this level of assist at d/c, they would need to look into alternative options for Pt.'s care as he could not go from CIR into SNF.  He states that he will ask family members and look into hiring additional help and get back to me on a plan to provide 24/7 physical assist after d/c from CIR.   14:00 Addendum: I spoke with pt.'s son Chrissie Noa who states that while he would like CIR for Pt., he ultimately wants pt. [facility] SNF in the long term. I explained that  plan for CIR would have to be home after CIR, not to SNF. He states that if that is the case, since Pt. Already has been accepted to [facility], he would like Pt. To d/c directly to [facility]. I will sign off and notify TOC.   I met with pt. To discuss potential CIR admit. He is interested and states that he has 24/7 support of a live in caregiver. I will follow for potential admit pending bed availability.     Megan Salon, MS, CCC-SLP Rehab Admissions Coordinator  [phone] (celll) [phone] (office)

## 2023-07-07 LAB — COMPREHENSIVE METABOLIC PANEL
ALT: 22 U/L (ref 0–44)
AST: 24 U/L (ref 15–41)
Albumin: 3.5 g/dL (ref 3.5–5.0)
Alkaline Phosphatase: 52 U/L (ref 38–126)
Anion gap: 11 (ref 5–15)
BUN: 32 mg/dL — ABNORMAL HIGH (ref 8–23)
CO2: 19 mmol/L — ABNORMAL LOW (ref 22–32)
Calcium: 9.1 mg/dL (ref 8.9–10.3)
Chloride: 100 mmol/L (ref 98–111)
Creatinine, Ser: 1.63 mg/dL — ABNORMAL HIGH (ref 0.61–1.24)
GFR, Estimated: 38 mL/min — ABNORMAL LOW (ref >60)
Glucose, Bld: 130 mg/dL — ABNORMAL HIGH (ref 70–99)
Potassium: 5.3 mmol/L — ABNORMAL HIGH (ref 3.5–5.1)
Sodium: 130 mmol/L — ABNORMAL LOW (ref 135–145)
Total Bilirubin: 0.5 mg/dL (ref 0.3–1.2)
Total Protein: 6.7 g/dL (ref 6.5–8.1)

## 2023-07-07 LAB — CBC
HCT: 32.7 % — ABNORMAL LOW (ref 39.0–52.0)
Hemoglobin: 10.8 g/dL — ABNORMAL LOW (ref 13.0–17.0)
MCH: 31.8 pg (ref 26.0–34.0)
MCHC: 33 g/dL (ref 30.0–36.0)
MCV: 96.2 fL (ref 80.0–100.0)
Platelets: 199 10*3/uL (ref 150–400)
RBC: 3.4 MIL/uL — ABNORMAL LOW (ref 4.22–5.81)
RDW: 15.7 % — ABNORMAL HIGH (ref 11.5–15.5)
WBC: 7.7 10*3/uL (ref 4.0–10.5)
nRBC: 0 % (ref 0.0–0.2)

## 2023-07-07 NOTE — Progress Notes (Signed)
Ref: Assunta Found, MD   Subjective:  Feeling better. Sodium level is improving. Potassium level is over corrected. Will DC potassium supplement. VS stable.  Objective:  Vital Signs in the last 24 hours: Temp:  [97.3 F (36.3 C)-98.2 F (36.8 C)] 98.2 F (36.8 C) (07/10 1529) Pulse Rate:  [66-74] 73 (07/10 1529) Cardiac Rhythm: Normal sinus rhythm (07/10 0700) Resp:  [16-20] 17 (07/10 1529) BP: (117-151)/(63-78) 146/78 (07/10 1529) SpO2:  [93 %-96 %] 95 % (07/10 1529) Weight:  [409 kg] 119 kg (07/10 0413)  Physical Exam: BP Readings from Last 1 Encounters:  07/07/23 (!) 146/78     Wt Readings from Last 1 Encounters:  07/07/23 119 kg    Weight change: -0.7 kg Body mass index is 37.64 kg/m. HEENT: [place]/AT, Eyes-Blue,  Conjunctiva-Pink, Sclera-Non-icteric Neck: No JVD, No bruit, Trachea midline. Lungs:  Clearing, Bilateral. Cardiac:  Regular rhythm, normal S1 and S2, no S3. II/VI systolic murmur. Abdomen:  Soft, non-tender. BS present. Extremities:  Trace edema present. No cyanosis. No clubbing.Dressing over bilateral venous stasis dermatitis CNS: AxOx3, Cranial nerves grossly intact, moves all 4 extremities.  Skin: Warm and dry.   Intake/Output from previous day: 07/09 0701 - 07/10 0700 In: 740 [P.O.:640; IV Piggyback:100] Out: 4250 [Urine:4250]    Lab Results: BMET    Component Value Date/Time   NA 130 (L) 07/07/2023 1257   NA 127 (L) 07/05/2023 0036   NA 127 (L) 07/04/2023 0031   NA 141 09/05/2021 0000   NA 140 08/20/2021 0000   K 5.3 (H) 07/07/2023 1257   K 4.7 07/05/2023 0036   K 4.2 07/04/2023 0031   CL 100 07/07/2023 1257   CL 95 (L) 07/05/2023 0036   CL 92 (L) 07/04/2023 0031   CO2 19 (L) 07/07/2023 1257   CO2 25 07/05/2023 0036   CO2 24 07/04/2023 0031   GLUCOSE 130 (H) 07/07/2023 1257   GLUCOSE 102 (H) 07/05/2023 0036   GLUCOSE 96 07/04/2023 0031   BUN 32 (H) 07/07/2023 1257   BUN 27 (H) 07/05/2023 0036   BUN 35 (H) 07/04/2023 0031   BUN 19  09/05/2021 0000   BUN 31 (A) 08/20/2021 0000   CREATININE 1.63 (H) 07/07/2023 1257   CREATININE 1.83 (H) 07/05/2023 0036   CREATININE 1.65 (H) 07/04/2023 0031   CALCIUM 9.1 07/07/2023 1257   CALCIUM 8.6 (L) 07/05/2023 0036   CALCIUM 8.6 (L) 07/04/2023 0031   GFRNONAA 38 (L) 07/07/2023 1257   GFRNONAA 33 (L) 07/05/2023 0036   GFRNONAA 38 (L) 07/04/2023 0031   GFRAA 52.44 09/05/2021 0000   GFRAA 44.28 08/20/2021 0000   GFRAA 48 (L) 03/17/2016 2018   CBC    Component Value Date/Time   WBC 7.7 07/07/2023 1257   RBC 3.40 (L) 07/07/2023 1257   HGB 10.8 (L) 07/07/2023 1257   HCT 32.7 (L) 07/07/2023 1257   PLT 199 07/07/2023 1257   MCV 96.2 07/07/2023 1257   MCH 31.8 07/07/2023 1257   MCHC 33.0 07/07/2023 1257   RDW 15.7 (H) 07/07/2023 1257   LYMPHSABS 0.4 (L) 07/01/2023 1853   MONOABS 0.6 07/01/2023 1853   EOSABS 0.0 07/01/2023 1853   BASOSABS 0.0 07/01/2023 1853   HEPATIC Function Panel Recent Labs    07/01/23 1853 07/05/23 0036 07/07/23 1257  PROT 7.3 5.8* 6.7  ALBUMIN 3.9 3.0* 3.5  AST 37 17 24  ALT 11 13 22   ALKPHOS 49 49 52   HEMOGLOBIN A1C Lab Results  Component Value Date  MPG 105.41 11/12/2021   CARDIAC ENZYMES Lab Results  Component Value Date   CKTOTAL 91 07/15/2021   BNP No results for input(s): "PROBNP" in the last 8760 hours. TSH No results for input(s): "TSH" in the last 8760 hours. CHOLESTEROL No results for input(s): "CHOL" in the last 8760 hours.  Scheduled Meds:  celecoxib  100 mg Oral BID   Chlorhexidine Gluconate Cloth  6 each Topical Daily   heparin  5,000 Units Subcutaneous Q8H   metoprolol tartrate  12.5 mg Oral BID   multivitamin with minerals  1 tablet Oral Daily   pantoprazole  40 mg Oral Daily   polyethylene glycol  17 g Oral Daily   simvastatin  20 mg Oral QHS   spironolactone  12.5 mg Oral Daily   Continuous Infusions:  sodium chloride 50 mL/hr at 07/07/23 1215   cefTRIAXone (ROCEPHIN)  IV 1 g (07/07/23 1045)   PRN  Meds:.magnesium hydroxide  Assessment/Plan: Bilateral lower extremities cellulitis Dehydration, resolved Abnormal troponin I from demand ischemia Acute renal failure, stable CKD, IIIb Severe hyponatremia, improving Moderate hypokalemia, resolved CAD PVD Obesity Possible SIADH   LOS: 6 days   Time spent including chart review, lab review, examination, discussion with patient/Nurse : 30 min   Orpah Cobb  MD  07/07/2023, 5:17 PM

## 2023-07-07 NOTE — Progress Notes (Signed)
Physical Therapy Treatment Patient Details Name: Caleb [name] MRN: [id number] DOB: 1925/06/07 Today's Date: 07/07/2023   History of Present Illness 89 years old Caleb [name] male admitted 7/4 after being  found on his bedroom floor by his live-in caregiver.  Pt with  2 day h/o weakness and fall x 2.   Pt with dehydration and r/o DVT.  Also w/u for ACS.  PMH of CAD, HLD, PVD, Venous stasis dermatitis and obesity    PT Comments  Pt admitted with above diagnosis. Pt was able to stand to [facility] with min assist of 2 persons and cues with bed elevated. Pt progressing with standing tolerance each day. Will follow acutely.   Pt currently with functional limitations due to the deficits listed below (see PT Problem List). Pt will benefit from acute skilled PT to increase their independence and safety with mobility to allow discharge.        Assistance Recommended at Discharge Frequent or constant Supervision/Assistance  If plan is discharge home, recommend the following:  Can travel by private vehicle    Two people to help with walking and/or transfers;Two people to help with bathing/dressing/bathroom;Assistance with cooking/housework;Assist for transportation;Help with stairs or ramp for entrance   No  Equipment Recommendations  None recommended by PT    Recommendations for Other Services       Precautions / Restrictions Precautions Precautions: Fall Restrictions Weight Bearing Restrictions: No     Mobility  Bed Mobility Overal bed mobility: Needs Assistance Bed Mobility: Rolling Rolling: Mod assist   Supine to sit: Min assist, HOB elevated     General bed mobility comments: min A for trunk elevation and scooting forward at EOB with use of bedpad    Transfers Overall transfer level: Needs assistance Equipment used: Ambulation equipment used (stedy) Transfers: Sit to/from Stand, Bed to chair/wheelchair/BSC Sit to Stand: From elevated surface, Min assist, +2 physical assistance            General transfer comment: Pt able to stand to stedy from elevated EOB with min A +2 and from stedy paddles with min guard. Cues for anterior lean. Pt with decreased B knee flexion with sitting requiring mod A for controlled descent.  Flexed posture as well. Transfer via Lift Equipment: Stedy  Ambulation/Gait             Pre-gait activities: Agricultural engineer in [employer]    Modified Rankin (Stroke Patients Only)       Balance Overall balance assessment: Needs assistance Sitting-balance support: No upper extremity supported, Feet supported Sitting balance-Leahy Scale: Fair Sitting balance - Comments: sitting EOB   Standing balance support: Bilateral upper extremity supported, During functional activity, Reliant on assistive device for balance Standing balance-Leahy Scale: Poor Standing balance comment: with stedy support                            Cognition Arousal/Alertness: Awake/alert Behavior During Therapy: WFL for tasks assessed/performed Overall Cognitive Status: Within Functional Limits for tasks assessed                                 General Comments: Pt repeating himself and requiring increased cues at times        Exercises General Exercises - Lower Extremity Long  Arc Quad: AROM, Both, 10 reps, Seated Hip Flexion/Marching: AROM, Both, Standing, 20 reps    General Comments General comments (skin integrity, edema, etc.): VSS      Pertinent Vitals/Pain Pain Assessment Pain Assessment: Faces Faces Pain Scale: Hurts a little bit Pain Location:  (heels of b/l feet) Pain Descriptors / Indicators: Discomfort Pain Intervention(s): Limited activity within patient's tolerance, Monitored during session, Repositioned    Home Living                          Prior Function            PT Goals (current goals can now be found in the care plan section) Acute  Rehab PT Goals Patient Stated Goal: to get Rehab Progress towards PT goals: Progressing toward goals    Frequency    Min 1X/week      PT Plan Current plan remains appropriate    Co-evaluation              AM-PAC PT "6 Clicks" Mobility   Outcome Measure  Help needed turning from your back to your side while in a flat bed without using bedrails?: A Little Help needed moving from lying on your back to sitting on the side of a flat bed without using bedrails?: A Little Help needed moving to and from a bed to a chair (including a wheelchair)?: Total Help needed standing up from a chair using your arms (e.g., wheelchair or bedside chair)?: A Lot Help needed to walk in hospital room?: Total Help needed climbing 3-5 steps with a railing? : Total 6 Click Score: 11    End of Session Equipment Utilized During Treatment: Gait belt Activity Tolerance: Patient limited by fatigue Patient left: in chair;with chair alarm set;with call bell/phone within reach Nurse Communication: Mobility status;Need for lift equipment PT Visit Diagnosis: Unsteadiness on feet (R26.81);Muscle weakness (generalized) (M62.81)     Time: 1610-9604 PT Time Calculation (min) (ACUTE ONLY): 21 min  Charges:    $Therapeutic Activity: 8-22 mins PT General Charges $$ ACUTE PT VISIT: 1 Visit                     Pratham Cassatt M,PT Acute Rehab Services [phone]    Bevelyn Buckles 07/07/2023, 2:18 PM

## 2023-07-07 NOTE — Progress Notes (Addendum)
Mobility Specialist Progress Note:   07/07/23 1359  Mobility  Activity Transferred from chair to bed  Level of Assistance Minimal assist, patient does 75% or more  Assistive Device Stedy  Activity Response Tolerated well  Mobility Referral Yes  $Mobility charge 1 Mobility  Mobility Specialist Start Time (ACUTE ONLY) 1359  Mobility Specialist Stop Time (ACUTE ONLY) 1415  Mobility Specialist Time Calculation (min) (ACUTE ONLY) 16 min   Pt ready to transfer back to bed. Required heavy minA to stand in stedy from recliner, minG to stand from stedy flaps. Pt asx throughout transfer. Left in bed with all needs met, bed alarm on.  Addison Lank Mobility Specialist Please contact via SecureChat or  Rehab office at [phone]

## 2023-07-08 MED ORDER — MAGNESIUM HYDROXIDE 400 MG/5ML PO SUSP: 30.0000 mL | Freq: Every day | ORAL | 0 refills | Status: DC | PRN

## 2023-07-08 MED ORDER — SPIRONOLACTONE 25 MG PO TABS: 12.5000 mg | ORAL_TABLET | Freq: Every day | ORAL | 3 refills | Status: DC

## 2023-07-08 MED ORDER — CELECOXIB 100 MG PO CAPS: 100.0000 mg | ORAL_CAPSULE | Freq: Every day | ORAL | 6 refills | Status: DC

## 2023-07-08 MED ORDER — PANTOPRAZOLE SODIUM 40 MG PO TBEC: 40.0000 mg | DELAYED_RELEASE_TABLET | Freq: Every day | ORAL | 3 refills | Status: DC

## 2023-07-08 MED ORDER — FUROSEMIDE 10 MG/ML IJ SOLN
20.0000 mg | Freq: Once | INTRAMUSCULAR | Status: AC
  Administered 2023-07-08: 20 mg via INTRAVENOUS
  Filled 2023-07-08: qty 2

## 2023-07-08 MED ORDER — CEPHALEXIN 250 MG PO CAPS
500.0000 mg | ORAL_CAPSULE | Freq: Two times a day (BID) | ORAL | Status: DC
  Administered 2023-07-08 – 2023-07-09 (×3): 500 mg via ORAL
  Filled 2023-07-08 (×3): qty 2

## 2023-07-08 MED ORDER — CEPHALEXIN 500 MG PO CAPS: 500.0000 mg | ORAL_CAPSULE | Freq: Two times a day (BID) | ORAL | 0 refills | Status: DC

## 2023-07-08 MED ORDER — METOPROLOL TARTRATE 25 MG PO TABS: 12.5000 mg | ORAL_TABLET | Freq: Two times a day (BID) | ORAL | 1 refills | Status: DC

## 2023-07-08 NOTE — TOC Progression Note (Addendum)
Transition of Care [facility]) - Progression Note    Patient Details  Name: Caleb [name] MRN: [id number] Date of Birth: April 17, 1925  Transition [facility]) CM/SW Contact  Dellie Burns [place], [place] Phone Number: 07/08/2023, 11:14 AM  Clinical Narrative:  Confirmed with Britta Mccreedy with CIR they signed off as pt's plan is SNF post rehab stay. Spoke to pt's son Garren and confirmed Charity fundraiser is [facility] SNF. Son verbalized understanding of reason for CIR denial. Message left for [place] in [place] admissions to confirm bed, await return call. MD updated.    UPDATE 1400: per [place] at [place], they will not have a bed until Monday. Discussed option of discharging to a different SNF and then transferring to [facility] when bed available with pt's son Juan who refused and is requesting dc Monday. MD updated and is agreeable for dc Monday to [facility]. [place] updated and confirmed they are able to admit Monday. SW will follow.     Dellie Burns, MSW, LCSW [phone] (coverage)      Expected Discharge Plan: Skilled Nursing Facility Barriers to Discharge: Continued Medical Work up  Expected Discharge Plan and Services In-house Referral: Clinical Social Work     Living arrangements for the past 2 months: Single Family Home                                       Social Determinants of Health (SDOH) Interventions SDOH Screenings   Food Insecurity: No Food Insecurity (07/01/2023)  Housing: Low Risk  (07/01/2023)  Transportation Needs: No Transportation Needs (07/01/2023)  Utilities: Not At Risk (07/01/2023)  Tobacco Use: Medium Risk (07/01/2023)    Readmission Risk Interventions    07/15/2021    1:30 PM 07/14/2021   10:40 AM  Readmission Risk Prevention Plan  Post Dischage Appt Complete   Medication Screening  Complete  Transportation Screening  Complete

## 2023-07-08 NOTE — Discharge Summary (Signed)
Physician Discharge Summary  Patient ID: Caleb [name] MRN: [id number] DOB/AGE: [birth year] 87 y.o.  Admit date: 07/01/2023 Discharge date: 07/08/2023  Admission Diagnoses: Dehydration Abnormal troponin I from demand ischemia Acute renal failure Severe hyponatremia CAD PVD Obesity Discharge Diagnoses:  Principal Problem:   Bilateral lower extremities cellulitis Active problems: Dehydration, improved Abnormal troponin I from demand ischemia HCM, non-obstructive Aortic valve stenosis, mild Mitral valve and tricuspid valve regurgitation Acute renal failure, improving CKD, IIIb Severe hyponatremia, improving CAD PVD Obesity Possible SIADH Anemia of chronic disease/Normocytic UTI  Discharged Condition: fair  Hospital Course: 89 years old white male with PMH of CAD, HLD, PVD, Venous stasis dermatitis and obesity had 2 day h/o weakness and fall x 2 prior to admission. He was found on his bedroom floor by his live-in caregiver. He was found to be clammy and had vomited x 2 per EMS during transport.  His Troponin I levels are elevated to 635 ng and 573 ng.  His sodium was very low at 121 mmol and creatinine is elevated at 2.11 mg/dL. Post hydration his sodium level improved to 130 mmol and creatinine to 1.63 mg. His diet was changed from low salt to regular salt diet. EKG: NSR, RBBB. CXR: Cardiomegaly. Echocardiogram: Preserved LV systolic function. Moderate LVH, moderate diastolic dysfunction, Mild MR and TR and mild Aortic valve stenosis. He was started on IV antibiotics followed by oral for bilateral lower extremities cellulitis. His appetite and ambulation improved some.  He was not a candidate for cardiac interventions due to 89 years old and co-morbidities. He was discharged home in stable condition with f/u by primary care in 2 weeks.  Consults: cardiology  Significant Diagnostic Studies: labs: Sodium 121 mmol on admission, 130 mmol on discharge. Creatinine 2.11 on  admission and 1.63 mg. on discharge. Hgb 9.6 gm to 11.5 gm. UA positive for nitrite, Bacteria and leukocytes. Troponin I mildly elevated and flat.  EKG: NSR, RBBB.  CXR: Cardiomegaly.  Echocardiogram: Preserved LV systolic function. Moderate LVH, moderate diastolic dysfunction, Mild MR and TR and mild Aortic valve stenosis.  Treatments: antibiotics: ceftriaxone and Keflex and cardiac meds: metoprolol, Simvastatin and Spironolactone.  Discharge Exam: Blood pressure (!) 142/69, pulse 66, temperature 97.6 F (36.4 C), temperature source Oral, resp. rate 19, height 5\' 10"  (1.778 m), weight 116.5 kg, SpO2 97%. General appearance: alert, cooperative and appears stated age. Head: Normocephalic, atraumatic. Eyes: Blue eyes, pink conjunctiva, corneas clear.   Neck: No adenopathy, no carotid bruit, no JVD, supple, symmetrical, trachea midline and thyroid not enlarged. Resp: Clear to auscultation bilaterally. Cardio: Regular rate and rhythm, S1, S2 normal, II/VI systolic murmur, no click, rub or gallop. GI: Soft, non-tender; bowel sounds normal; no organomegaly. Extremities: Trace edema, cyanosis or clubbing. Dressing over bilateral lower leg venous stasis dermatitis Skin: Warm and dry.  Neurologic: Alert and oriented X 2, normal strength and tone.  Disposition: Discharge disposition: 03-Skilled Nursing Facility        Allergies as of 07/08/2023   No Known Allergies      Medication List     STOP taking these medications    aspirin EC 81 MG tablet   furosemide 20 MG tablet Commonly known as: LASIX   metolazone 2.5 MG tablet Commonly known as: ZAROXOLYN       TAKE these medications    CALCIUM-D PO Take 1 tablet by mouth daily.   celecoxib 100 MG capsule Commonly known as: CELEBREX Take 1 capsule (100 mg total) by mouth daily.   cephALEXin 500  MG capsule Commonly known as: KEFLEX Take 1 capsule (500 mg total) by mouth every 12 (twelve) hours.   magnesium hydroxide  400 MG/5ML suspension Commonly known as: MILK OF MAGNESIA Take 30 mLs by mouth daily as needed for mild constipation.   metoprolol tartrate 25 MG tablet Commonly known as: LOPRESSOR Take 0.5 tablets (12.5 mg total) by mouth 2 (two) times daily.   multivitamin with minerals Tabs tablet Take 1 tablet by mouth daily. Centrum Silver   pantoprazole 40 MG tablet Commonly known as: PROTONIX Take 1 tablet (40 mg total) by mouth daily. Start taking on: July 09, 2023 What changed: additional instructions   polyethylene glycol powder 17 GM/SCOOP powder Commonly known as: GLYCOLAX/MIRALAX Take 17 g by mouth daily. What changed:  when to take this reasons to take this   PreserVision AREDS 2 Caps Take 1 tablet by mouth daily.   simvastatin 20 MG tablet Commonly known as: ZOCOR Take 1 tablet (20 mg total) by mouth at bedtime.   spironolactone 25 MG tablet Commonly known as: ALDACTONE Take 0.5 tablets (12.5 mg total) by mouth daily. Start taking on: July 09, 2023   TUMS PO Take 1 tablet by mouth as needed (heartburn).        Contact information for follow-up providers     Assunta Found, MD Follow up in 2 week(s).   Specialty: Family Medicine Contact information: [address] [place] [address] [phone]              Contact information for after-discharge care     Destination     [facility] Preferred SNF .   Service: Skilled Nursing Contact information: [address] Test Update Address [place] [address] [phone]                     Time spent: Review of old chart, current chart, lab, x-ray, cardiac tests and discussion with patient over 60 minutes.  Signed: Ricki Rodriguez 07/08/2023, 11:33 AM

## 2023-07-08 NOTE — Progress Notes (Signed)
Occupational Therapy Treatment Patient Details Name: Caleb [name] MRN: [id number] DOB: [birth year] Today's Date: 07/08/2023   History of present illness 89 years old white male admitted 7/4 after being  found on his bedroom floor by his live-in caregiver.  Pt with  2 day h/o weakness and fall x 2.   Pt with dehydration and r/o DVT.  Also w/u for ACS.  PMH of CAD, HLD, PVD, Venous stasis dermatitis and obesity   OT comments  Mod assist to roll for pericare. Pt needing more assist (max) to EOB. Stood from elevated bed with min assist with stedy, from stedy platforms with min guard assist and from chair with mod progressing to min assist x 2. Completed grooming at chair level with set up. Pt wheezing, RN notified.    Recommendations for follow up therapy are one component of a multi-disciplinary discharge planning process, led by the attending physician.  Recommendations may be updated based on patient status, additional functional criteria and insurance authorization.    Assistance Recommended at Discharge Frequent or constant Supervision/Assistance  Patient can return home with the following  A lot of help with walking and/or transfers;A lot of help with bathing/dressing/bathroom;Assistance with cooking/housework   Equipment Recommendations  Other (comment) (defer to next venue)    Recommendations for Other Services      Precautions / Restrictions Precautions Precautions: Fall Restrictions Weight Bearing Restrictions: No       Mobility Bed Mobility Overal bed mobility: Needs Assistance Bed Mobility: Rolling, Supine to Sit Rolling: Mod assist   Supine to sit: Max assist, HOB elevated     General bed mobility comments: assist to raise trunk and for hisp to EOB    Transfers Overall transfer level: Needs assistance Equipment used: Ambulation equipment used Transfers: Sit to/from Stand Sit to Stand: From elevated surface, Min assist           General transfer comment:  stood x 1 from elevated bed, x 5 from platforms of stedy and x 2 from chair with pillow under buttocks Transfer via Lift Equipment: Stedy   Balance Overall balance assessment: Needs assistance   Sitting balance-Leahy Scale: Fair     Standing balance support: Bilateral upper extremity supported, During functional activity, Reliant on assistive device for balance Standing balance-Leahy Scale: Poor                             ADL either performed or assessed with clinical judgement   ADL Overall ADL's : Needs assistance/impaired     Grooming: Oral care;Sitting;Set up                       Toileting- Clothing Manipulation and Hygiene: Total assistance;Bed level              Extremity/Trunk Assessment              Vision       Perception     Praxis      Cognition Arousal/Alertness: Awake/alert Behavior During Therapy: Flat affect Overall Cognitive Status: Impaired/Different from baseline Area of Impairment: Memory                     Memory: Decreased short-term memory         General Comments: repeats himself, did not recall using stedy        Exercises      Shoulder Instructions  General Comments      Pertinent Vitals/ Pain       Pain Assessment Pain Assessment: No/denies pain  Home Living                                          Prior Functioning/Environment              Frequency  Min 1X/week        Progress Toward Goals  OT Goals(current goals can now be found in the care plan section)  Progress towards OT goals: Progressing toward goals  Acute Rehab OT Goals OT Goal Formulation: With patient Time For Goal Achievement: 07/18/23 Potential to Achieve Goals: Good  Plan Discharge plan needs to be updated    Co-evaluation                 AM-PAC OT "6 Clicks" Daily Activity     Outcome Measure   Help from another person eating meals?: None Help from another  person taking care of personal grooming?: A Little Help from another person toileting, which includes using toliet, bedpan, or urinal?: Total Help from another person bathing (including washing, rinsing, drying)?: A Lot Help from another person to put on and taking off regular upper body clothing?: A Little Help from another person to put on and taking off regular lower body clothing?: Total 6 Click Score: 14    End of Session Equipment Utilized During Treatment: Gait belt  OT Visit Diagnosis: Muscle weakness (generalized) (M62.81);Repeated falls (R29.6);History of falling (Z91.81);Unsteadiness on feet (R26.81)   Activity Tolerance Patient tolerated treatment well   Patient Left in chair;with call bell/phone within reach;with chair alarm set;with family/visitor present   Nurse Communication Other (comment) (wheezing)        Time: 1610-9604 OT Time Calculation (min): 24 min  Charges: OT General Charges $OT Visit: 1 Visit OT Treatments $Self Care/Home Management : 8-22 mins $Therapeutic Activity: 8-22 mins  Berna Spare, OTR/L Acute Rehabilitation Services Office: [phone]   Evern Bio 07/08/2023, 3:04 PM

## 2023-07-08 NOTE — Progress Notes (Signed)
   07/08/23 1554  Mobility  Activity Transferred from chair to bed  Level of Assistance Minimal assist, patient does 75% or more  Assistive Device Stedy  Activity Response Tolerated well  Mobility Referral Yes  $Mobility charge 1 Mobility  Mobility Specialist Start Time (ACUTE ONLY) 1530  Mobility Specialist Stop Time (ACUTE ONLY) 1545  Mobility Specialist Time Calculation (min) (ACUTE ONLY) 15 min   Pt requested transfer back to bed from recliner. Used stedy with minA. Pt left in bed with all needs met. Bed alarm on.   Maurene Capes, BS Mobility Specialist

## 2023-07-09 DIAGNOSIS — R001 Bradycardia, unspecified: Secondary | ICD-10-CM | POA: Diagnosis not present

## 2023-07-09 DIAGNOSIS — I739 Peripheral vascular disease, unspecified: Secondary | ICD-10-CM | POA: Diagnosis not present

## 2023-07-09 DIAGNOSIS — L03116 Cellulitis of left lower limb: Secondary | ICD-10-CM | POA: Diagnosis not present

## 2023-07-09 DIAGNOSIS — I2510 Atherosclerotic heart disease of native coronary artery without angina pectoris: Secondary | ICD-10-CM | POA: Diagnosis not present

## 2023-07-09 DIAGNOSIS — R531 Weakness: Secondary | ICD-10-CM | POA: Diagnosis not present

## 2023-07-09 DIAGNOSIS — E871 Hypo-osmolality and hyponatremia: Secondary | ICD-10-CM | POA: Diagnosis not present

## 2023-07-09 DIAGNOSIS — L03115 Cellulitis of right lower limb: Secondary | ICD-10-CM | POA: Diagnosis not present

## 2023-07-09 DIAGNOSIS — N179 Acute kidney failure, unspecified: Secondary | ICD-10-CM | POA: Diagnosis not present

## 2023-07-09 DIAGNOSIS — R600 Localized edema: Secondary | ICD-10-CM | POA: Diagnosis not present

## 2023-07-09 DIAGNOSIS — I422 Other hypertrophic cardiomyopathy: Secondary | ICD-10-CM | POA: Diagnosis not present

## 2023-07-09 DIAGNOSIS — I959 Hypotension, unspecified: Secondary | ICD-10-CM | POA: Diagnosis not present

## 2023-07-09 DIAGNOSIS — M6281 Muscle weakness (generalized): Secondary | ICD-10-CM | POA: Diagnosis not present

## 2023-07-09 DIAGNOSIS — I10 Essential (primary) hypertension: Secondary | ICD-10-CM | POA: Diagnosis not present

## 2023-07-09 DIAGNOSIS — E222 Syndrome of inappropriate secretion of antidiuretic hormone: Secondary | ICD-10-CM | POA: Diagnosis not present

## 2023-07-09 DIAGNOSIS — Z7401 Bed confinement status: Secondary | ICD-10-CM | POA: Diagnosis not present

## 2023-07-09 DIAGNOSIS — R2689 Other abnormalities of gait and mobility: Secondary | ICD-10-CM | POA: Diagnosis not present

## 2023-07-09 DIAGNOSIS — E785 Hyperlipidemia, unspecified: Secondary | ICD-10-CM | POA: Diagnosis not present

## 2023-07-09 DIAGNOSIS — K5900 Constipation, unspecified: Secondary | ICD-10-CM | POA: Diagnosis not present

## 2023-07-09 DIAGNOSIS — G8921 Chronic pain due to trauma: Secondary | ICD-10-CM | POA: Diagnosis not present

## 2023-07-09 DIAGNOSIS — D72829 Elevated white blood cell count, unspecified: Secondary | ICD-10-CM | POA: Diagnosis not present

## 2023-07-09 DIAGNOSIS — Z960 Presence of urogenital implants: Secondary | ICD-10-CM | POA: Diagnosis not present

## 2023-07-09 DIAGNOSIS — K922 Gastrointestinal hemorrhage, unspecified: Secondary | ICD-10-CM | POA: Diagnosis not present

## 2023-07-09 DIAGNOSIS — R339 Retention of urine, unspecified: Secondary | ICD-10-CM | POA: Diagnosis not present

## 2023-07-09 DIAGNOSIS — R61 Generalized hyperhidrosis: Secondary | ICD-10-CM | POA: Diagnosis not present

## 2023-07-09 DIAGNOSIS — D649 Anemia, unspecified: Secondary | ICD-10-CM | POA: Diagnosis not present

## 2023-07-09 DIAGNOSIS — R410 Disorientation, unspecified: Secondary | ICD-10-CM | POA: Diagnosis not present

## 2023-07-09 DIAGNOSIS — E860 Dehydration: Secondary | ICD-10-CM | POA: Diagnosis not present

## 2023-07-09 DIAGNOSIS — R278 Other lack of coordination: Secondary | ICD-10-CM | POA: Diagnosis not present

## 2023-07-09 DIAGNOSIS — I480 Paroxysmal atrial fibrillation: Secondary | ICD-10-CM | POA: Diagnosis not present

## 2023-07-09 DIAGNOSIS — I249 Acute ischemic heart disease, unspecified: Secondary | ICD-10-CM | POA: Diagnosis not present

## 2023-07-09 DIAGNOSIS — D539 Nutritional anemia, unspecified: Secondary | ICD-10-CM | POA: Diagnosis not present

## 2023-07-09 NOTE — Progress Notes (Signed)
RN attempted multiple times to change dressing, Patient declined.

## 2023-07-09 NOTE — Care Management Important Message (Signed)
Important Message  Patient Details  Name: Caleb [name] MRN: [id number] Date of Birth: [birth year]   Medicare Important Message Given:  Yes     Renie Ora 07/09/2023, 12:19 PM

## 2023-07-09 NOTE — TOC Transition Note (Signed)
Transition of Care [facility]) - CM/SW Discharge Note   Patient Details  Name: BODHIN LOZEAU MRN: [id number] Date of Birth: [birth year]  Transition of Care [facility]) CM/SW Contact:  Deatra [place], [place] Phone Number: 07/09/2023, 11:51 AM   Clinical Narrative:   Pt for dc to [facility] today. Spoke to [place] in [facility] admissions who confirmed they are prepared to admit pt to room 404B. Pt's son Rohen aware of dc and reports agreeable. RN provided with number for report and PTAR arranged for transport. SW signing off at dc.   Dellie Burns, MSW, LCSW [phone] (coverage)      Final next level of care: Skilled Nursing Facility Barriers to Discharge: No Barriers Identified   Patient Goals and CMS Choice CMS Medicare.gov Compare Post Acute Care list provided to:: Patient Choice offered to / list presented to : Adult Children  Discharge Placement                Patient chooses bed at: [facility] Patient to be transferred to facility by: [facility]AR Name of family member notified: Lando/son Patient and family notified of of transfer: 07/09/23  Discharge Plan and Services Additional resources added to the After Visit Summary for   In-house Referral: Clinical Social Work                                   Social Determinants of Health (SDOH) Interventions SDOH Screenings   Food Insecurity: No Food Insecurity (07/01/2023)  Housing: Low Risk  (07/01/2023)  Transportation Needs: No Transportation Needs (07/01/2023)  Utilities: Not At Risk (07/01/2023)  Tobacco Use: Medium Risk (07/01/2023)     Readmission Risk Interventions    07/15/2021    1:30 PM 07/14/2021   10:40 AM  Readmission Risk Prevention Plan  Post Dischage Appt Complete   Medication Screening  Complete  Transportation Screening  Complete

## 2023-07-09 NOTE — Plan of Care (Signed)

## 2023-07-09 NOTE — Progress Notes (Signed)
   07/09/23 1100  Mobility  Activity Transferred from chair to bed  Level of Assistance Moderate assist, patient does 50-74%  Assistive Device Stedy  Activity Response Tolerated well  Mobility Referral Yes  $Mobility charge 1 Mobility  Mobility Specialist Start Time (ACUTE ONLY) 1100  Mobility Specialist Stop Time (ACUTE ONLY) 1115  Mobility Specialist Time Calculation (min) (ACUTE ONLY) 15 min   Mobility Specialist: Progress Note   Pt requested to transfer from recliner to bed. Required ModA to stand from recliner using stedy. Pt with no c/o. Left with all needs met. Bed alarm on.  Barnie Mort Mobility Specialist Please contact via SecureChat or Rehab office at [phone]

## 2023-07-09 NOTE — Plan of Care (Signed)
  Problem: Education: Goal: Knowledge of General Education information will improve Description: Including pain rating scale, medication(s)/side effects and non-pharmacologic comfort measures Outcome: Progressing   Problem: Clinical Measurements: Goal: Ability to maintain clinical measurements within normal limits will improve Outcome: Progressing Goal: Will remain free from infection Outcome: Progressing Goal: Diagnostic test results will improve Outcome: Progressing Goal: Respiratory complications will improve Outcome: Progressing   Problem: Activity: Goal: Risk for activity intolerance will decrease Outcome: Progressing   Problem: Nutrition: Goal: Adequate nutrition will be maintained Outcome: Progressing   Problem: Coping: Goal: Level of anxiety will decrease Outcome: Progressing   Problem: Elimination: Goal: Will not experience complications related to bowel motility Outcome: Progressing   Problem: Pain Managment: Goal: General experience of comfort will improve Outcome: Progressing   Problem: Safety: Goal: Ability to remain free from injury will improve Outcome: Progressing

## 2023-07-09 NOTE — Progress Notes (Signed)
Report given to Tina. 

## 2023-07-09 NOTE — Progress Notes (Signed)
Physical Therapy Treatment Patient Details Name: Caleb [name] MRN: [id number] DOB: [birth year] Today's Date: 07/09/2023   History of Present Illness 89 years old Caleb [name] male admitted 7/4 after being  found on his bedroom floor by his live-in caregiver.  Pt with  2 day h/o weakness and fall x 2.   Pt with dehydration and r/o DVT.  Also w/u for ACS.  PMH of CAD, HLD, PVD, Venous stasis dermatitis and obesity    PT Comments  Pt admitted with above diagnosis. Pt was able to progress ambulation today with +2 mod assist with close chair follow. Pt fearful and needs max encouragment. Pt making good progress overall.  Will continue to follow acutely.  Pt currently with functional limitations due to the deficits listed below (see PT Problem List). Pt will benefit from acute skilled PT to increase their independence and safety with mobility to allow discharge.        Assistance Recommended at Discharge Frequent or constant Supervision/Assistance  If plan is discharge home, recommend the following:  Can travel by private vehicle    Two people to help with walking and/or transfers;Two people to help with bathing/dressing/bathroom;Assistance with cooking/housework;Assist for transportation;Help with stairs or ramp for entrance   No  Equipment Recommendations  None recommended by PT    Recommendations for Other Services       Precautions / Restrictions Precautions Precautions: Fall Restrictions Weight Bearing Restrictions: No     Mobility  Bed Mobility Overal bed mobility: Needs Assistance Bed Mobility: Rolling, Supine to Sit Rolling: Mod assist   Supine to sit: HOB elevated, Mod assist     General bed mobility comments: assist to raise trunk and for hips to EOB    Transfers Overall transfer level: Needs assistance Equipment used: Rolling Mccandlish (2 wheels) Transfers: Sit to/from Stand Sit to Stand: From elevated surface, Mod assist, +2 physical assistance           General  transfer comment: stood x 2 from elevated bed to RW, with cues for standing tall with pt with posterior lean heavy at times.    Ambulation/Gait Ambulation/Gait assistance: Mod assist, +2 physical assistance Gait Distance (Feet): 12 Feet Assistive device: Rolling Kohlmeyer (2 wheels) Gait Pattern/deviations: Step-to pattern, Decreased step length - right, Decreased step length - left, Decreased stride length, Knee flexed in stance - right, Knee flexed in stance - left, Shuffle, Leaning posteriorly, Drifts right/left, Trunk flexed, Wide base of support   Gait velocity interpretation: <1.31 ft/sec, indicative of household ambulator   General Gait Details: Pt very fearful but convinced pt to try and progress ambulation. With encouragement and +2 mod assist progressing to min assist of 2 pt was able to take steps and walk to door with close chair follow and max cues for postureal stability.  Pt needed cues for sequencing steps and RW as well.   Stairs             Wheelchair Mobility     Tilt Bed    Modified Rankin (Stroke Patients Only)       Balance Overall balance assessment: Needs assistance Sitting-balance support: Feet supported, Bilateral upper extremity supported Sitting balance-Leahy Scale: Fair Sitting balance - Comments: Pt can sit EOB without assist but tends to maintain a posterior lean. Postural control: Posterior lean Standing balance support: Bilateral upper extremity supported, During functional activity, Reliant on assistive device for balance Standing balance-Leahy Scale: Poor Standing balance comment: Able to stand to RW with mod assist and cues as  well as assist due to posterior lean.                            Cognition Arousal/Alertness: Awake/alert Behavior During Therapy: Flat affect Overall Cognitive Status: Impaired/Different from baseline Area of Impairment: Memory                     Memory: Decreased short-term memory          General Comments: repeats himself        Exercises General Exercises - Lower Extremity Long Arc Quad: AROM, Both, 10 reps, Seated Hip Flexion/Marching: AROM, Both, Standing, 20 reps    General Comments General comments (skin integrity, edema, etc.): VSS      Pertinent Vitals/Pain Pain Assessment Pain Assessment: No/denies pain    Home Living                          Prior Function            PT Goals (current goals can now be found in the care plan section) Acute Rehab PT Goals Patient Stated Goal: to get Rehab Progress towards PT goals: Progressing toward goals    Frequency    Min 1X/week      PT Plan Current plan remains appropriate    Co-evaluation              AM-PAC PT "6 Clicks" Mobility   Outcome Measure  Help needed turning from your back to your side while in a flat bed without using bedrails?: A Little Help needed moving from lying on your back to sitting on the side of a flat bed without using bedrails?: A Little Help needed moving to and from a bed to a chair (including a wheelchair)?: Total Help needed standing up from a chair using your arms (e.g., wheelchair or bedside chair)?: A Lot Help needed to walk in hospital room?: Total Help needed climbing 3-5 steps with a railing? : Total 6 Click Score: 11    End of Session Equipment Utilized During Treatment: Gait belt Activity Tolerance: Patient limited by fatigue Patient left: in chair;with chair alarm set;with call bell/phone within reach Nurse Communication: Mobility status;Need for lift equipment PT Visit Diagnosis: Unsteadiness on feet (R26.81);Muscle weakness (generalized) (M62.81)     Time: 8657-8469 PT Time Calculation (min) (ACUTE ONLY): 23 min  Charges:    $Gait Training: 8-22 mins $Therapeutic Exercise: 8-22 mins PT General Charges $$ ACUTE PT VISIT: 1 Visit                     Merl Guardino M,PT Acute Rehab Services [phone]    Bevelyn Buckles 07/09/2023,  2:00 PM

## 2023-07-09 NOTE — Discharge Summary (Signed)
Physician Discharge Summary  Patient ID: Caleb [name] MRN: [id number] DOB/AGE: 1925-11-23 89 y.o.  Admit date: 07/01/2023 Discharge date: 07/09/2023  Admission Diagnoses: Dehydration Abnormal troponin I from demand ischemia Acute renal failure Severe hyponatremia CAD PVD Obesity  Discharge Diagnoses:  Principal Problem:   Bilateral lower extremities cellulitis Active problems: Dehydration, improved Abnormal troponin I from demand ischemia HCM, non-obstructive Aortic valve stenosis, mild Mitral valve and tricuspid valve regurgitation Acute renal failure, improving CKD, IIIb Severe hyponatremia, improving CAD PVD Obesity Possible SIADH Anemia of chronic disease/Normocytic UTI  Discharge Diagnoses:  Principal Problem:   ACS (acute coronary syndrome) (HCC)   Discharged Condition: fair  Hospital Course: 89 years old white male with PMH of CAD, HLD, PVD, Venous stasis dermatitis and obesity had 2 day h/o weakness and fall x 2 prior to admission. He was found on his bedroom floor by his live-in caregiver. He was found to be clammy and had vomited x 2 per EMS during transport.  His Troponin I levels are elevated to 635 ng and 573 ng.  His sodium was very low at 121 mmol and creatinine is elevated at 2.11 mg/dL. Post hydration his sodium level improved to 130 mmol and creatinine to 1.63 mg. His diet was changed from low salt to regular salt diet. EKG: NSR, RBBB. CXR: Cardiomegaly. Echocardiogram: Preserved LV systolic function. Moderate LVH, moderate diastolic dysfunction, Mild MR and TR and mild Aortic valve stenosis. He was started on IV antibiotics followed by oral for bilateral lower extremities cellulitis. His appetite and ambulation improved some.  He was not a candidate for cardiac interventions due to advanced age and co-morbidities. He was discharged home in stable condition with f/u by primary care in 2 weeks.  Consults: cardiology  Significant Diagnostic  Studies: labs:  Sodium 121 mmol on admission, 130 mmol on discharge. Creatinine 2.11 on admission and 1.63 mg. on discharge. Hgb 9.6 gm to 11.5 gm. UA positive for nitrite, Bacteria and leukocytes. Troponin I mildly elevated and flat.   EKG: NSR, RBBB.   CXR: Cardiomegaly.   Echocardiogram: Preserved LV systolic function. Moderate LVH, moderate diastolic dysfunction, Mild MR and TR and mild Aortic valve stenosis.  Treatments: antibiotics: ceftriaxone and oral keflex. Cardiac medications: Metoprolol, Simvastatin and spironolactone.  Discharge Exam: Blood pressure 126/73, pulse 83, temperature 97.8 F (36.6 C), temperature source Oral, resp. rate 19, height 5\' 10"  (1.778 m), weight 114.3 kg, SpO2 94%. General appearance: alert, cooperative and appears stated age. Head: Normocephalic, atraumatic. Eyes: Blue eyes, pink conjunctiva, corneas clear.   Neck: No adenopathy, no carotid bruit, no JVD, supple, symmetrical, trachea midline and thyroid not enlarged. Resp: Clear to auscultation bilaterally. Cardio: Regular rate and rhythm, S1, S2 normal, II/VI systolic murmur, no click, rub or gallop. GI: Soft, non-tender; bowel sounds normal; no organomegaly. Extremities: Trace edema, cyanosis or clubbing. Dressing over bilateral lower leg venous stasis dermatitis Skin: Warm and dry.  Neurologic: Alert and oriented X 2, normal strength and tone.  Disposition: Discharge disposition: 03-Skilled Nursing Facility   Allergies as of 07/09/2023   No Known Allergies      Medication List     STOP taking these medications    aspirin EC 81 MG tablet   furosemide 20 MG tablet Commonly known as: LASIX   metolazone 2.5 MG tablet Commonly known as: ZAROXOLYN       TAKE these medications    CALCIUM-D PO Take 1 tablet by mouth daily.   celecoxib 100 MG capsule Commonly known as: CELEBREX  Take 1 capsule (100 mg total) by mouth daily.   cephALEXin 500 MG capsule Commonly known as:  KEFLEX Take 1 capsule (500 mg total) by mouth every 12 (twelve) hours.   magnesium hydroxide 400 MG/5ML suspension Commonly known as: MILK OF MAGNESIA Take 30 mLs by mouth daily as needed for mild constipation.   metoprolol tartrate 25 MG tablet Commonly known as: LOPRESSOR Take 0.5 tablets (12.5 mg total) by mouth 2 (two) times daily.   multivitamin with minerals Tabs tablet Take 1 tablet by mouth daily. Centrum Silver   pantoprazole 40 MG tablet Commonly known as: PROTONIX Take 1 tablet (40 mg total) by mouth daily. What changed: additional instructions   polyethylene glycol powder 17 GM/SCOOP powder Commonly known as: GLYCOLAX/MIRALAX Take 17 g by mouth daily. What changed:  when to take this reasons to take this   PreserVision AREDS 2 Caps Take 1 tablet by mouth daily.   simvastatin 20 MG tablet Commonly known as: ZOCOR Take 1 tablet (20 mg total) by mouth at bedtime.   spironolactone 25 MG tablet Commonly known as: ALDACTONE Take 0.5 tablets (12.5 mg total) by mouth daily.   TUMS PO Take 1 tablet by mouth as needed (heartburn).        Follow-up Information     Assunta Found, MD Follow up in 2 week(s).   Specialty: Family Medicine Contact information: [address] [place] [address] [phone]                 Time spent: Review of old chart, current chart, lab, x-ray, cardiac tests and discussion with patient over 60 minutes.  Signed: Ricki Rodriguez 07/09/2023, 10:43 AM

## 2023-07-12 DIAGNOSIS — E860 Dehydration: Secondary | ICD-10-CM

## 2023-07-12 DIAGNOSIS — R339 Retention of urine, unspecified: Secondary | ICD-10-CM

## 2023-07-12 DIAGNOSIS — M6281 Muscle weakness (generalized): Secondary | ICD-10-CM

## 2023-07-12 DIAGNOSIS — R2689 Other abnormalities of gait and mobility: Secondary | ICD-10-CM

## 2023-07-12 DIAGNOSIS — I249 Acute ischemic heart disease, unspecified: Secondary | ICD-10-CM

## 2023-07-12 DIAGNOSIS — R278 Other lack of coordination: Secondary | ICD-10-CM

## 2023-07-12 DIAGNOSIS — K922 Gastrointestinal hemorrhage, unspecified: Secondary | ICD-10-CM

## 2023-07-12 DIAGNOSIS — D539 Nutritional anemia, unspecified: Secondary | ICD-10-CM

## 2023-07-12 DIAGNOSIS — I959 Hypotension, unspecified: Secondary | ICD-10-CM

## 2023-07-12 DIAGNOSIS — N179 Acute kidney failure, unspecified: Secondary | ICD-10-CM

## 2023-07-12 DIAGNOSIS — E785 Hyperlipidemia, unspecified: Secondary | ICD-10-CM

## 2023-07-12 NOTE — Consult Note (Signed)
   [facility] Inpatient Consult   07/12/2023  Caleb [name] Aug 03, 1925 [id number]  Covering Charlesetta Shanks, RN  Update: Pt discharged on 7/12 to [facility] for ongoing rehab. Hospital liaison collaborated with PAC-RN concerning pt's discharge disposition.  Of note, Care Management services does not replace or interfere with any services that are needed or arranged by inpatient [facility] care management team.   For additional questions or referrals please contact:    Elliot Cousin, RN, [facility] Liaison [place]   Population Health Office Hours MTWF  8:00 am-6:00 pm Off on Thursday [phone] mobile [phone] [Office toll free line] Office Hours are M-F 8:30 - 5 pm 24 hour nurse advise line [phone] Concierge  [email or web address]

## 2023-07-13 ENCOUNTER — Telehealth: Payer: Self-pay

## 2023-07-13 NOTE — Telephone Encounter (Signed)
[facility] apt canceled on 07/19

## 2023-07-13 NOTE — Telephone Encounter (Addendum)
Inetta Fermo from [facility] called to see if they could have verbal orders to change patient's catheter on 07/19 or does patient need to keep upcoming NV apt.  Call back [phone]

## 2023-07-14 DIAGNOSIS — E785 Hyperlipidemia, unspecified: Secondary | ICD-10-CM | POA: Diagnosis not present

## 2023-07-14 DIAGNOSIS — K922 Gastrointestinal hemorrhage, unspecified: Secondary | ICD-10-CM | POA: Diagnosis not present

## 2023-07-14 DIAGNOSIS — D539 Nutritional anemia, unspecified: Secondary | ICD-10-CM | POA: Diagnosis not present

## 2023-07-14 DIAGNOSIS — I959 Hypotension, unspecified: Secondary | ICD-10-CM | POA: Diagnosis not present

## 2023-07-15 ENCOUNTER — Ambulatory Visit: Payer: Medicare Other

## 2023-07-15 DIAGNOSIS — E871 Hypo-osmolality and hyponatremia: Secondary | ICD-10-CM | POA: Diagnosis not present

## 2023-07-15 DIAGNOSIS — E222 Syndrome of inappropriate secretion of antidiuretic hormone: Secondary | ICD-10-CM | POA: Diagnosis not present

## 2023-07-15 DIAGNOSIS — I739 Peripheral vascular disease, unspecified: Secondary | ICD-10-CM | POA: Diagnosis not present

## 2023-07-15 DIAGNOSIS — N179 Acute kidney failure, unspecified: Secondary | ICD-10-CM | POA: Diagnosis not present

## 2023-07-16 ENCOUNTER — Encounter (HOSPITAL_COMMUNITY): Payer: Self-pay

## 2023-07-16 ENCOUNTER — Other Ambulatory Visit: Payer: Self-pay

## 2023-07-16 ENCOUNTER — Emergency Department (HOSPITAL_COMMUNITY)
Admission: EM | Admit: 2023-07-16 | Discharge: 2023-07-17 | Disposition: A | Discharge status: Home / Self Care | Payer: Medicare Other | Attending: Emergency Medicine | Admitting: Emergency Medicine

## 2023-07-16 DIAGNOSIS — D649 Anemia, unspecified: Secondary | ICD-10-CM | POA: Insufficient documentation

## 2023-07-16 DIAGNOSIS — I2510 Atherosclerotic heart disease of native coronary artery without angina pectoris: Secondary | ICD-10-CM | POA: Insufficient documentation

## 2023-07-16 DIAGNOSIS — N179 Acute kidney failure, unspecified: Secondary | ICD-10-CM | POA: Diagnosis not present

## 2023-07-16 DIAGNOSIS — D72829 Elevated white blood cell count, unspecified: Secondary | ICD-10-CM | POA: Insufficient documentation

## 2023-07-16 DIAGNOSIS — E871 Hypo-osmolality and hyponatremia: Secondary | ICD-10-CM | POA: Insufficient documentation

## 2023-07-16 LAB — CBC WITH DIFFERENTIAL/PLATELET
Abs Immature Granulocytes: 0.05 10*3/uL (ref 0.00–0.07)
Basophils Absolute: 0 10*3/uL (ref 0.0–0.1)
Basophils Relative: 0 %
Eosinophils Absolute: 0 10*3/uL (ref 0.0–0.5)
Eosinophils Relative: 0 %
HCT: 32.8 % — ABNORMAL LOW (ref 39.0–52.0)
Hemoglobin: 11.4 g/dL — ABNORMAL LOW (ref 13.0–17.0)
Immature Granulocytes: 0 %
Lymphocytes Relative: 10 %
Lymphs Abs: 1.1 10*3/uL (ref 0.7–4.0)
MCH: 32.2 pg (ref 26.0–34.0)
MCHC: 34.8 g/dL (ref 30.0–36.0)
MCV: 92.7 fL (ref 80.0–100.0)
Monocytes Absolute: 1.2 10*3/uL — ABNORMAL HIGH (ref 0.1–1.0)
Monocytes Relative: 11 %
Neutro Abs: 8.8 10*3/uL — ABNORMAL HIGH (ref 1.7–7.7)
Neutrophils Relative %: 79 %
Platelets: 180 10*3/uL (ref 150–400)
RBC: 3.54 MIL/uL — ABNORMAL LOW (ref 4.22–5.81)
RDW: 15.3 % (ref 11.5–15.5)
WBC: 11.2 10*3/uL — ABNORMAL HIGH (ref 4.0–10.5)
nRBC: 0.2 % (ref 0.0–0.2)

## 2023-07-16 LAB — COMPREHENSIVE METABOLIC PANEL
ALT: 20 U/L (ref 0–44)
AST: 28 U/L (ref 15–41)
Albumin: 3.9 g/dL (ref 3.5–5.0)
Alkaline Phosphatase: 63 U/L (ref 38–126)
Anion gap: 16 — ABNORMAL HIGH (ref 5–15)
BUN: 50 mg/dL — ABNORMAL HIGH (ref 8–23)
CO2: 21 mmol/L — ABNORMAL LOW (ref 22–32)
Calcium: 9.6 mg/dL (ref 8.9–10.3)
Chloride: 89 mmol/L — ABNORMAL LOW (ref 98–111)
Creatinine, Ser: 2.3 mg/dL — ABNORMAL HIGH (ref 0.61–1.24)
GFR, Estimated: 25 mL/min — ABNORMAL LOW (ref >60)
Glucose, Bld: 123 mg/dL — ABNORMAL HIGH (ref 70–99)
Potassium: 4.3 mmol/L (ref 3.5–5.1)
Sodium: 126 mmol/L — ABNORMAL LOW (ref 135–145)
Total Bilirubin: 0.6 mg/dL (ref 0.3–1.2)
Total Protein: 6.9 g/dL (ref 6.5–8.1)

## 2023-07-16 MED ORDER — LACTATED RINGERS IV BOLUS
1000.0000 mL | Freq: Once | INTRAVENOUS | Status: AC
  Administered 2023-07-16: 1000 mL via INTRAVENOUS

## 2023-07-16 NOTE — ED Triage Notes (Signed)
Pt had routine blood work drawn at nursing home which came back yesterday reporting sodium level of 123. Nursing home was unable to get sodium tabs delivered today and sent patient to ED as a result. Pt is asymptomatic. Confused at baseline. Chronic foley. Denies pain, difficulty breathing, N/V.

## 2023-07-16 NOTE — ED Provider Notes (Signed)
[place] EMERGENCY DEPARTMENT AT [facility] Provider Note   CSN: [id number] Arrival date & time: 07/16/23  2105     History  Chief Complaint  Patient presents with   Abnormal Lab    Caleb [name] is a 89 y.o. male with past medical history significant for CAD, obesity, hyperlipidemia presents to the ED for hyponatremia.  Patient had routine blood work drawn at skilled nursing facility which came back reporting sodium level of 123.  [facility] was unable to get sodium tablets delivered today and sent patient to the ED for treatment.  Patient is asymptomatic.  He is mildly confused at baseline.  Patient reports that he does not have regular sodium intake with his meals.  Denies nausea, vomiting, headache, increased confusion, fatigue, muscle weakness or spasms.       Home Medications Prior to Admission medications   Medication Sig Start Date End Date Taking? Authorizing Provider  Calcium Carbonate Antacid (TUMS PO) Take 1 tablet by mouth as needed (heartburn).    [provider]  Calcium Carbonate-Vitamin D (CALCIUM-D PO) Take 1 tablet by mouth daily.    [provider]  celecoxib (CELEBREX) 100 MG capsule Take 1 capsule (100 mg total) by mouth daily. 07/08/23   Orpah Cobb, MD  cephALEXin (KEFLEX) 500 MG capsule Take 1 capsule (500 mg total) by mouth every 12 (twelve) hours. 07/08/23   Orpah Cobb, MD  magnesium hydroxide (MILK OF MAGNESIA) 400 MG/5ML suspension Take 30 mLs by mouth daily as needed for mild constipation. 07/08/23   Orpah Cobb, MD  metoprolol tartrate (LOPRESSOR) 25 MG tablet Take 0.5 tablets (12.5 mg total) by mouth 2 (two) times daily. 07/08/23   Orpah Cobb, MD  Multiple Vitamin (MULTIVITAMIN WITH MINERALS) TABS tablet Take 1 tablet by mouth daily. Centrum Silver    [provider]  Multiple Vitamins-Minerals (PRESERVISION AREDS 2) CAPS Take 1 tablet by mouth daily.    [provider]  pantoprazole (PROTONIX) 40  MG tablet Take 1 tablet (40 mg total) by mouth daily. 07/09/23   Orpah Cobb, MD  polyethylene glycol powder (GLYCOLAX/MIRALAX) 17 GM/SCOOP powder Take 17 g by mouth daily. Patient taking differently: Take 17 g by mouth as needed for mild constipation or moderate constipation. 09/23/21   Medina-Vargas, Monina C, NP  simvastatin (ZOCOR) 20 MG tablet Take 1 tablet (20 mg total) by mouth at bedtime. Patient not taking: Reported on 07/03/2023 09/23/21   Medina-Vargas, Monina C, NP  spironolactone (ALDACTONE) 25 MG tablet Take 0.5 tablets (12.5 mg total) by mouth daily. 07/09/23   Orpah Cobb, MD      Allergies    Patient has no known allergies.    Review of Systems   Review of Systems  Constitutional:  Negative for fatigue.  Gastrointestinal:  Negative for nausea and vomiting.  Neurological:  Negative for weakness and headaches.    Physical Exam Updated Vital Signs BP 119/64 (BP Location: Left Arm)   Pulse 78   Temp 98.2 F (36.8 C) (Oral)   Resp 16   Ht 5\' 10"  (1.778 m)   Wt 114.3 kg   SpO2 96%   BMI 36.16 kg/m  Physical Exam Vitals and nursing note reviewed.  Constitutional:      General: He is not in acute distress.    Appearance: Normal appearance. He is not ill-appearing or diaphoretic.  Cardiovascular:     Rate and Rhythm: Normal rate and regular rhythm.     Heart sounds: Murmur heard.  Pulmonary:  Effort: Pulmonary effort is normal. No tachypnea or respiratory distress.     Breath sounds: Normal breath sounds and air entry.  Skin:    General: Skin is warm and dry.     Capillary Refill: Capillary refill takes less than 2 seconds.  Neurological:     Mental Status: He is alert. Mental status is at baseline.     Comments: Mildly confused and asking repetitive questions, is at his baseline.  Psychiatric:        Mood and Affect: Mood normal.        Behavior: Behavior normal.     ED Results / Procedures / Treatments   Labs (all labs ordered are listed, but only  abnormal results are displayed) Labs Reviewed  COMPREHENSIVE METABOLIC PANEL - Abnormal; Notable for the following components:      Result Value   Sodium 126 (*)    Chloride 89 (*)    CO2 21 (*)    Glucose, Bld 123 (*)    BUN 50 (*)    Creatinine, Ser 2.30 (*)    GFR, Estimated 25 (*)    Anion gap 16 (*)    All other components within normal limits  CBC WITH DIFFERENTIAL/PLATELET - Abnormal; Notable for the following components:   WBC 11.2 (*)    RBC 3.54 (*)    Hemoglobin 11.4 (*)    HCT 32.8 (*)    Neutro Abs 8.8 (*)    Monocytes Absolute 1.2 (*)    All other components within normal limits    EKG EKG Interpretation Date/Time:  Friday July 16 2023 21:14:30 EDT Ventricular Rate:  80 PR Interval:  261 QRS Duration:  153 QT Interval:  400 QTC Calculation: 462 R Axis:   -34  Text Interpretation: Sinus rhythm Prolonged PR interval Right bundle branch block Inferior infarct, old Lateral infarct, acute >>> Acute MI <<< Confirmed by Glyn Ade [phone]) on 07/16/2023 11:44:38 PM Also confirmed by Glyn Ade [phone])  on 07/16/2023 11:45:01 PM  Radiology No results found.  Procedures Procedures    Medications Ordered in ED Medications  lactated ringers bolus 1,000 mL (0 mLs Intravenous Stopped 07/16/23 2356)    ED Course/ Medical Decision Making/ A&P                             Medical Decision Making Amount and/or Complexity of Data Reviewed Labs: ordered.   This patient presents to the ED with chief complaint(s) of abnormal lab/hyponatremia with pertinent past medical history of hyperlipidemia, CAD.  The complaint involves an extensive differential diagnosis and also carries with it a high risk of complications and morbidity.    The differential diagnosis includes hyponatremia, other electrolyte disturbance, dehydration  The initial plan is to obtain baseline labs  Initial Assessment:   Exam significant for overall well-appearing patient who is not in  acute distress.  Patient is mentating at his baseline.  He does ask occasional repetitive questions.  Heart rate is normal in the 70s with auscultated murmur.  Lungs are clear to auscultation bilaterally.  No obvious pedal edema.  Independent ECG/labs interpretation:  The following labs were independently interpreted:  CBC with mild leukocytosis and anemia.  Leukocytosis may be reactive, patient is afebrile and without complaint.  Metabolic panel with hyponatremia at 126.  Creatinine is elevated above patient's baseline at 2.30 with slightly elevated anion gap concerning for kidney injury.  Treatment and Reassessment: Will give patient 1 L  of lactated Ringer's and reassess BMP.  Disposition:   12:00 AM Care of patient transferred to Barrie Dunker and Dr. Clayborne Dana at the end of my shift as the patient will require reassessment once labs/imaging have resulted. Patient presentation, ED course, and plan of care discussed with review of all pertinent labs and imaging. Please see his/her note for further details regarding further ED course and disposition. Plan at time of handoff is repeat BNP following IV fluids.  If patient's Cr is improving, he will be appropriate for discharge back to his facility with push for increased PO hydration.  Patient appears to have some hyponatremia at baseline and is asymptomatic. This may be altered or completely changed at the discretion of the oncoming team pending results of further workup.           Final Clinical Impression(s) / ED Diagnoses Final diagnoses:  Hyponatremia    Rx / DC Orders ED Discharge Orders     None         Lenard Simmer, PA-C 07/17/23 0000    Glyn Ade, MD 07/19/23 [phone]

## 2023-07-17 DIAGNOSIS — E871 Hypo-osmolality and hyponatremia: Secondary | ICD-10-CM | POA: Diagnosis not present

## 2023-07-17 LAB — BASIC METABOLIC PANEL
Anion gap: 10 (ref 5–15)
BUN: 47 mg/dL — ABNORMAL HIGH (ref 8–23)
CO2: 25 mmol/L (ref 22–32)
Calcium: 9.4 mg/dL (ref 8.9–10.3)
Chloride: 92 mmol/L — ABNORMAL LOW (ref 98–111)
Creatinine, Ser: 2.11 mg/dL — ABNORMAL HIGH (ref 0.61–1.24)
GFR, Estimated: 28 mL/min — ABNORMAL LOW (ref >60)
Glucose, Bld: 109 mg/dL — ABNORMAL HIGH (ref 70–99)
Potassium: 4 mmol/L (ref 3.5–5.1)
Sodium: 127 mmol/L — ABNORMAL LOW (ref 135–145)

## 2023-07-17 NOTE — ED Provider Notes (Signed)
  Physical Exam  BP 117/67   Pulse 81   Temp 97.6 F (36.4 C) (Oral)   Resp 20   Ht 5\' 10"  (1.778 m)   Wt 114.3 kg   SpO2 99%   BMI 36.16 kg/m   Physical Exam  Procedures  Procedures  ED Course / MDM    Medical Decision Making Amount and/or Complexity of Data Reviewed Labs: ordered.   Patient care assumed at shift handoff from [facility], PA-C.  Please see her note for full details.  In short, patient presents to the emergency department from skilled nursing facility for hyponatremia.  Patient had routine lab work performed at [facility] showing sodium level of 123.  SNF was unable to get patient sodium tablets delivered today and sent the patient to the emergency department for treatment.  Patient is asymptomatic at this time, mildly confused which is baseline.  Patient reports having no regular sodium intake with his meals.  Patient denied nausea, vomiting, headache, increased confusion, fatigue, muscle weakness, spasms  Fluids have been administered prior to my assumption of care.  Patient with sodium of 126, and baseline range, and mild AKI with creatinine of 2.3 (baseline appears to be between 1.63 and 1.85).  Plan to have patient finish initial IV fluids and repeat BMP to look for improvement in kidney function.  If kidney function does not worsen or improves, patient may discharge back to facility.   BUN with a slight improvement from 50 to 47; creatinine improved from 2.3 to 2.11. With patient feeling at baseline, feel it would be reasonable to discharge back to his facility at this time with recommendations for continued oral hydration and follow up BMP in 1 week to check for AKI resolution/GFR improvement.   There is no indication at this time for admission. Discharge to SNF    Caleb [name] 07/17/23 0140    Mesner, Barbara Cower, MD 07/17/23 [phone]

## 2023-07-17 NOTE — ED Notes (Signed)
Pt picked up by [facility] to go back to [facility] SN[facility]. All paperwork and belongings sent with [facility] personnel. Pt awake/alert/ and in no distress upon departure.

## 2023-07-17 NOTE — Discharge Instructions (Addendum)
You were evaluated tonight due to low sodium levels.  Your sodium levels at this time are consistent with baseline, slightly improved from where they were a few weeks ago.  Continue to take medication as prescribed and follow-up with your primary team.    You were noted to have slightly decreased kidney function tonight compared to your baseline.  This improved with fluids.  Please continue to drink fluids and schedule a follow-up lab appointment in 1 week to check for improvement in kidney function.  If you develop any life-threatening symptoms please return to the emergency department.

## 2023-07-17 NOTE — ED Notes (Signed)
Called [facility] to transport patient back to [facility]

## 2023-07-22 ENCOUNTER — Telehealth: Payer: Self-pay | Admitting: *Deleted

## 2023-07-22 ENCOUNTER — Telehealth: Payer: Self-pay

## 2023-07-22 NOTE — Telephone Encounter (Signed)
Transition Care Management Unsuccessful Follow-up Telephone Call  Date of discharge and from where:  [facility] 7/20  Attempts:  1st Attempt  Reason for unsuccessful TCM follow-up call:  No answer/busy

## 2023-07-22 NOTE — Progress Notes (Signed)
  Care Coordination   Note   07/22/2023 Name: Caleb [name] MRN: [id number] DOB: [birth year]  Caleb [name] is a 89 y.o. year old male who sees Caleb Northern, MD for primary care. I reached out to Caleb [name] by phone today to offer care coordination services.  [name] was given information about Care Coordination services today including:   The Care Coordination services include support from the care team which includes your Nurse Coordinator, Clinical Social Worker, or Pharmacist.  The Care Coordination team is here to help remove barriers to the health concerns and goals most important to you. Care Coordination services are voluntary, and the patient may decline or stop services at any time by request to their care team member.   Care Coordination Consent Status: Patient did not agree to participate in care coordination services at this time. Patient is at [facility] per patient.      Encounter Outcome:  Pt. Refused

## 2023-07-23 ENCOUNTER — Telehealth: Payer: Self-pay

## 2023-07-23 DIAGNOSIS — L03115 Cellulitis of right lower limb: Secondary | ICD-10-CM | POA: Diagnosis not present

## 2023-07-23 DIAGNOSIS — I249 Acute ischemic heart disease, unspecified: Secondary | ICD-10-CM | POA: Diagnosis not present

## 2023-07-23 DIAGNOSIS — R278 Other lack of coordination: Secondary | ICD-10-CM | POA: Diagnosis not present

## 2023-07-23 DIAGNOSIS — R600 Localized edema: Secondary | ICD-10-CM | POA: Diagnosis not present

## 2023-07-23 DIAGNOSIS — R339 Retention of urine, unspecified: Secondary | ICD-10-CM | POA: Diagnosis not present

## 2023-07-23 DIAGNOSIS — R2689 Other abnormalities of gait and mobility: Secondary | ICD-10-CM | POA: Diagnosis not present

## 2023-07-23 DIAGNOSIS — M6281 Muscle weakness (generalized): Secondary | ICD-10-CM | POA: Diagnosis not present

## 2023-07-23 DIAGNOSIS — Z960 Presence of urogenital implants: Secondary | ICD-10-CM | POA: Diagnosis not present

## 2023-07-23 NOTE — Telephone Encounter (Signed)
Transition Care Management Unsuccessful Follow-up Telephone Call  Date of discharge and from where:  [facility] 7/20  Attempts:  1st Attempt  Reason for unsuccessful TCM follow-up call:  No answer/busy   Lenard Forth [facility] Guide, [facility] [phone] [address] [place], [place], [place] [address] Phone: [phone] Email: [email or web address]

## 2023-07-26 DIAGNOSIS — I249 Acute ischemic heart disease, unspecified: Secondary | ICD-10-CM | POA: Diagnosis not present

## 2023-07-26 DIAGNOSIS — R339 Retention of urine, unspecified: Secondary | ICD-10-CM | POA: Diagnosis not present

## 2023-07-26 DIAGNOSIS — M6281 Muscle weakness (generalized): Secondary | ICD-10-CM | POA: Diagnosis not present

## 2023-07-26 DIAGNOSIS — R600 Localized edema: Secondary | ICD-10-CM | POA: Diagnosis not present

## 2023-07-26 DIAGNOSIS — R2689 Other abnormalities of gait and mobility: Secondary | ICD-10-CM | POA: Diagnosis not present

## 2023-07-26 DIAGNOSIS — R278 Other lack of coordination: Secondary | ICD-10-CM | POA: Diagnosis not present

## 2023-07-26 DIAGNOSIS — Z960 Presence of urogenital implants: Secondary | ICD-10-CM | POA: Diagnosis not present

## 2023-07-26 DIAGNOSIS — L03115 Cellulitis of right lower limb: Secondary | ICD-10-CM | POA: Diagnosis not present

## 2023-07-27 DIAGNOSIS — M6281 Muscle weakness (generalized): Secondary | ICD-10-CM | POA: Diagnosis not present

## 2023-07-27 DIAGNOSIS — L03115 Cellulitis of right lower limb: Secondary | ICD-10-CM | POA: Diagnosis not present

## 2023-07-27 DIAGNOSIS — R339 Retention of urine, unspecified: Secondary | ICD-10-CM | POA: Diagnosis not present

## 2023-07-27 DIAGNOSIS — R278 Other lack of coordination: Secondary | ICD-10-CM | POA: Diagnosis not present

## 2023-07-27 DIAGNOSIS — R600 Localized edema: Secondary | ICD-10-CM | POA: Diagnosis not present

## 2023-07-27 DIAGNOSIS — Z960 Presence of urogenital implants: Secondary | ICD-10-CM | POA: Diagnosis not present

## 2023-07-27 DIAGNOSIS — R2689 Other abnormalities of gait and mobility: Secondary | ICD-10-CM | POA: Diagnosis not present

## 2023-07-27 DIAGNOSIS — I249 Acute ischemic heart disease, unspecified: Secondary | ICD-10-CM | POA: Diagnosis not present

## 2023-07-29 DIAGNOSIS — R2689 Other abnormalities of gait and mobility: Secondary | ICD-10-CM | POA: Diagnosis not present

## 2023-07-29 DIAGNOSIS — R600 Localized edema: Secondary | ICD-10-CM | POA: Diagnosis not present

## 2023-07-29 DIAGNOSIS — I249 Acute ischemic heart disease, unspecified: Secondary | ICD-10-CM | POA: Diagnosis not present

## 2023-07-29 DIAGNOSIS — M6281 Muscle weakness (generalized): Secondary | ICD-10-CM | POA: Diagnosis not present

## 2023-07-29 DIAGNOSIS — Z960 Presence of urogenital implants: Secondary | ICD-10-CM | POA: Diagnosis not present

## 2023-07-29 DIAGNOSIS — K5900 Constipation, unspecified: Secondary | ICD-10-CM | POA: Diagnosis not present

## 2023-07-29 DIAGNOSIS — R339 Retention of urine, unspecified: Secondary | ICD-10-CM | POA: Diagnosis not present

## 2023-07-29 DIAGNOSIS — R278 Other lack of coordination: Secondary | ICD-10-CM | POA: Diagnosis not present

## 2023-08-05 DIAGNOSIS — R600 Localized edema: Secondary | ICD-10-CM | POA: Diagnosis not present

## 2023-08-05 DIAGNOSIS — K5900 Constipation, unspecified: Secondary | ICD-10-CM | POA: Diagnosis not present

## 2023-08-05 DIAGNOSIS — I249 Acute ischemic heart disease, unspecified: Secondary | ICD-10-CM | POA: Diagnosis not present

## 2023-08-05 DIAGNOSIS — Z960 Presence of urogenital implants: Secondary | ICD-10-CM | POA: Diagnosis not present

## 2023-08-05 DIAGNOSIS — R278 Other lack of coordination: Secondary | ICD-10-CM | POA: Diagnosis not present

## 2023-08-05 DIAGNOSIS — R2689 Other abnormalities of gait and mobility: Secondary | ICD-10-CM | POA: Diagnosis not present

## 2023-08-05 DIAGNOSIS — M6281 Muscle weakness (generalized): Secondary | ICD-10-CM | POA: Diagnosis not present

## 2023-08-05 DIAGNOSIS — R339 Retention of urine, unspecified: Secondary | ICD-10-CM | POA: Diagnosis not present

## 2023-08-11 ENCOUNTER — Encounter: Payer: Self-pay | Admitting: Urology

## 2023-08-11 NOTE — Telephone Encounter (Signed)
Outside labs

## 2023-08-12 DIAGNOSIS — Z7982 Long term (current) use of aspirin: Secondary | ICD-10-CM | POA: Diagnosis not present

## 2023-08-12 DIAGNOSIS — I872 Venous insufficiency (chronic) (peripheral): Secondary | ICD-10-CM | POA: Diagnosis not present

## 2023-08-12 DIAGNOSIS — L97811 Non-pressure chronic ulcer of other part of right lower leg limited to breakdown of skin: Secondary | ICD-10-CM | POA: Diagnosis not present

## 2023-08-12 DIAGNOSIS — L03115 Cellulitis of right lower limb: Secondary | ICD-10-CM | POA: Diagnosis not present

## 2023-08-12 DIAGNOSIS — I249 Acute ischemic heart disease, unspecified: Secondary | ICD-10-CM | POA: Diagnosis not present

## 2023-08-12 DIAGNOSIS — Z466 Encounter for fitting and adjustment of urinary device: Secondary | ICD-10-CM | POA: Diagnosis not present

## 2023-08-12 DIAGNOSIS — M6281 Muscle weakness (generalized): Secondary | ICD-10-CM | POA: Diagnosis not present

## 2023-08-12 DIAGNOSIS — R339 Retention of urine, unspecified: Secondary | ICD-10-CM | POA: Diagnosis not present

## 2023-08-12 DIAGNOSIS — E871 Hypo-osmolality and hyponatremia: Secondary | ICD-10-CM | POA: Diagnosis not present

## 2023-08-12 DIAGNOSIS — D649 Anemia, unspecified: Secondary | ICD-10-CM | POA: Diagnosis not present

## 2023-08-13 DIAGNOSIS — L03115 Cellulitis of right lower limb: Secondary | ICD-10-CM | POA: Diagnosis not present

## 2023-08-13 DIAGNOSIS — E871 Hypo-osmolality and hyponatremia: Secondary | ICD-10-CM | POA: Diagnosis not present

## 2023-08-13 DIAGNOSIS — I249 Acute ischemic heart disease, unspecified: Secondary | ICD-10-CM | POA: Diagnosis not present

## 2023-08-13 DIAGNOSIS — D649 Anemia, unspecified: Secondary | ICD-10-CM | POA: Diagnosis not present

## 2023-08-13 DIAGNOSIS — I872 Venous insufficiency (chronic) (peripheral): Secondary | ICD-10-CM | POA: Diagnosis not present

## 2023-08-13 DIAGNOSIS — L97811 Non-pressure chronic ulcer of other part of right lower leg limited to breakdown of skin: Secondary | ICD-10-CM | POA: Diagnosis not present

## 2023-08-16 ENCOUNTER — Ambulatory Visit: Payer: Medicare Other

## 2023-08-17 ENCOUNTER — Ambulatory Visit: Payer: Medicare Other

## 2023-08-17 DIAGNOSIS — L03115 Cellulitis of right lower limb: Secondary | ICD-10-CM | POA: Diagnosis not present

## 2023-08-17 DIAGNOSIS — L97811 Non-pressure chronic ulcer of other part of right lower leg limited to breakdown of skin: Secondary | ICD-10-CM | POA: Diagnosis not present

## 2023-08-17 DIAGNOSIS — E871 Hypo-osmolality and hyponatremia: Secondary | ICD-10-CM | POA: Diagnosis not present

## 2023-08-17 DIAGNOSIS — I872 Venous insufficiency (chronic) (peripheral): Secondary | ICD-10-CM | POA: Diagnosis not present

## 2023-08-17 DIAGNOSIS — I249 Acute ischemic heart disease, unspecified: Secondary | ICD-10-CM | POA: Diagnosis not present

## 2023-08-17 DIAGNOSIS — D649 Anemia, unspecified: Secondary | ICD-10-CM | POA: Diagnosis not present

## 2023-08-18 ENCOUNTER — Ambulatory Visit (INDEPENDENT_AMBULATORY_CARE_PROVIDER_SITE_OTHER): Payer: Medicare Other

## 2023-08-18 DIAGNOSIS — R339 Retention of urine, unspecified: Secondary | ICD-10-CM | POA: Diagnosis not present

## 2023-08-18 NOTE — Addendum Note (Signed)
Addended by: Christoper Fabian R on: 08/18/2023 04:37 PM   Modules accepted: Orders

## 2023-08-18 NOTE — Addendum Note (Signed)
Addended by: Christoper Fabian R on: 08/18/2023 02:52 PM   Modules accepted: Level of Service

## 2023-08-18 NOTE — Progress Notes (Addendum)
Cath Change/ Replacement  Patient is present today for a catheter change due to urinary retention.  10ml of water was removed from the balloon, a 22FR foley cath was removed without difficulty.  Patient was cleaned and prepped in a sterile fashion with betadine and 2% lidocaine jelly was instilled into the urethra. A 22 FR foley cath was replaced into the bladder, no complications were noted. Urine return was noted and urine was yellow in color. The balloon was filled with 10ml of sterile water. A night bag was attached for drainage.  A night bag was also given to the patient and patient was given instruction on how to change from one bag to another. Patient was given proper instruction on catheter care.    Patient presents today with complaints of  burning and frequent urination.  UA  and urine culture done today.  Dr. Ronne Binning reviewed results and  .  Patient aware of MD recommendations and that we will reach out with culture results.      N/A    Performed by: Kennyth Lose, CMA  Follow up: No follow-ups on file.

## 2023-08-19 LAB — URINALYSIS, ROUTINE W REFLEX MICROSCOPIC
Bilirubin, UA: NEGATIVE
Glucose, UA: NEGATIVE
Ketones, UA: NEGATIVE
Nitrite, UA: NEGATIVE
Specific Gravity, UA: 1.015 (ref 1.005–1.030)
Urobilinogen, Ur: 0.2 mg/dL (ref 0.2–1.0)
pH, UA: 6 (ref 5.0–7.5)

## 2023-08-19 LAB — MICROSCOPIC EXAMINATION
RBC, Urine: >30 /HPF — AB (ref 0–2)
WBC, UA: >30 /HPF — AB (ref 0–5)

## 2023-08-22 LAB — URINE CULTURE

## 2023-08-23 DIAGNOSIS — L03115 Cellulitis of right lower limb: Secondary | ICD-10-CM | POA: Diagnosis not present

## 2023-08-23 DIAGNOSIS — I872 Venous insufficiency (chronic) (peripheral): Secondary | ICD-10-CM | POA: Diagnosis not present

## 2023-08-23 DIAGNOSIS — L97811 Non-pressure chronic ulcer of other part of right lower leg limited to breakdown of skin: Secondary | ICD-10-CM | POA: Diagnosis not present

## 2023-08-23 DIAGNOSIS — D649 Anemia, unspecified: Secondary | ICD-10-CM | POA: Diagnosis not present

## 2023-08-23 DIAGNOSIS — R69 Illness, unspecified: Secondary | ICD-10-CM | POA: Diagnosis not present

## 2023-08-23 DIAGNOSIS — I249 Acute ischemic heart disease, unspecified: Secondary | ICD-10-CM | POA: Diagnosis not present

## 2023-08-23 DIAGNOSIS — E871 Hypo-osmolality and hyponatremia: Secondary | ICD-10-CM | POA: Diagnosis not present

## 2023-08-24 DIAGNOSIS — D649 Anemia, unspecified: Secondary | ICD-10-CM | POA: Diagnosis not present

## 2023-08-24 DIAGNOSIS — I249 Acute ischemic heart disease, unspecified: Secondary | ICD-10-CM | POA: Diagnosis not present

## 2023-08-24 DIAGNOSIS — E871 Hypo-osmolality and hyponatremia: Secondary | ICD-10-CM | POA: Diagnosis not present

## 2023-08-24 DIAGNOSIS — L97811 Non-pressure chronic ulcer of other part of right lower leg limited to breakdown of skin: Secondary | ICD-10-CM | POA: Diagnosis not present

## 2023-08-24 DIAGNOSIS — L03115 Cellulitis of right lower limb: Secondary | ICD-10-CM | POA: Diagnosis not present

## 2023-08-24 DIAGNOSIS — I872 Venous insufficiency (chronic) (peripheral): Secondary | ICD-10-CM | POA: Diagnosis not present

## 2023-08-25 ENCOUNTER — Telehealth: Payer: Self-pay

## 2023-08-25 MED ORDER — NITROFURANTOIN MONOHYD MACRO 100 MG PO CAPS: 100.0000 mg | ORAL_CAPSULE | Freq: Two times a day (BID) | ORAL | 0 refills | Status: DC

## 2023-08-25 NOTE — Telephone Encounter (Signed)
Son called and made aware of positive urine culture. Sone request for medication to be sent via mail order due to patient not having any transportation to get medication. Son states his dad is not currently showing in s/s of UTI and believes it ok for him to wait the few days for it to come in.

## 2023-08-26 DIAGNOSIS — L03115 Cellulitis of right lower limb: Secondary | ICD-10-CM | POA: Diagnosis not present

## 2023-08-26 DIAGNOSIS — I872 Venous insufficiency (chronic) (peripheral): Secondary | ICD-10-CM | POA: Diagnosis not present

## 2023-08-26 DIAGNOSIS — D649 Anemia, unspecified: Secondary | ICD-10-CM | POA: Diagnosis not present

## 2023-08-26 DIAGNOSIS — I249 Acute ischemic heart disease, unspecified: Secondary | ICD-10-CM | POA: Diagnosis not present

## 2023-08-26 DIAGNOSIS — L97811 Non-pressure chronic ulcer of other part of right lower leg limited to breakdown of skin: Secondary | ICD-10-CM | POA: Diagnosis not present

## 2023-08-26 DIAGNOSIS — E871 Hypo-osmolality and hyponatremia: Secondary | ICD-10-CM | POA: Diagnosis not present

## 2023-08-29 ENCOUNTER — Emergency Department (HOSPITAL_COMMUNITY): Payer: Medicare Other

## 2023-08-29 ENCOUNTER — Other Ambulatory Visit: Payer: Self-pay

## 2023-08-29 ENCOUNTER — Inpatient Hospital Stay (HOSPITAL_COMMUNITY)
Admission: EM | Admit: 2023-08-29 | Discharge: 2023-09-01 | DRG: 645 | Disposition: A | Discharge status: Skilled Nursing Facility | Payer: Medicare Other | Attending: Family Medicine | Admitting: Family Medicine

## 2023-08-29 ENCOUNTER — Encounter (HOSPITAL_COMMUNITY): Payer: Self-pay

## 2023-08-29 DIAGNOSIS — R54 Age-related physical debility: Secondary | ICD-10-CM | POA: Diagnosis present

## 2023-08-29 DIAGNOSIS — Z951 Presence of aortocoronary bypass graft: Secondary | ICD-10-CM | POA: Diagnosis not present

## 2023-08-29 DIAGNOSIS — Z87440 Personal history of urinary (tract) infections: Secondary | ICD-10-CM

## 2023-08-29 DIAGNOSIS — D631 Anemia in chronic kidney disease: Secondary | ICD-10-CM | POA: Diagnosis present

## 2023-08-29 DIAGNOSIS — R296 Repeated falls: Secondary | ICD-10-CM | POA: Diagnosis present

## 2023-08-29 DIAGNOSIS — I878 Other specified disorders of veins: Secondary | ICD-10-CM | POA: Diagnosis present

## 2023-08-29 DIAGNOSIS — R29898 Other symptoms and signs involving the musculoskeletal system: Secondary | ICD-10-CM | POA: Diagnosis not present

## 2023-08-29 DIAGNOSIS — E871 Hypo-osmolality and hyponatremia: Secondary | ICD-10-CM

## 2023-08-29 DIAGNOSIS — R339 Retention of urine, unspecified: Secondary | ICD-10-CM | POA: Diagnosis present

## 2023-08-29 DIAGNOSIS — I700 Atherosclerosis of aorta: Secondary | ICD-10-CM | POA: Diagnosis not present

## 2023-08-29 DIAGNOSIS — K219 Gastro-esophageal reflux disease without esophagitis: Secondary | ICD-10-CM | POA: Diagnosis present

## 2023-08-29 DIAGNOSIS — I2510 Atherosclerotic heart disease of native coronary artery without angina pectoris: Secondary | ICD-10-CM | POA: Diagnosis present

## 2023-08-29 DIAGNOSIS — E785 Hyperlipidemia, unspecified: Secondary | ICD-10-CM | POA: Diagnosis present

## 2023-08-29 DIAGNOSIS — R5383 Other fatigue: Secondary | ICD-10-CM | POA: Diagnosis not present

## 2023-08-29 DIAGNOSIS — R262 Difficulty in walking, not elsewhere classified: Secondary | ICD-10-CM | POA: Diagnosis not present

## 2023-08-29 DIAGNOSIS — N1832 Chronic kidney disease, stage 3b: Secondary | ICD-10-CM | POA: Diagnosis not present

## 2023-08-29 DIAGNOSIS — M160 Bilateral primary osteoarthritis of hip: Secondary | ICD-10-CM | POA: Diagnosis not present

## 2023-08-29 DIAGNOSIS — S8012XA Contusion of left lower leg, initial encounter: Secondary | ICD-10-CM | POA: Diagnosis present

## 2023-08-29 DIAGNOSIS — Z8719 Personal history of other diseases of the digestive system: Secondary | ICD-10-CM | POA: Diagnosis not present

## 2023-08-29 DIAGNOSIS — Z66 Do not resuscitate: Secondary | ICD-10-CM | POA: Diagnosis present

## 2023-08-29 DIAGNOSIS — N390 Urinary tract infection, site not specified: Secondary | ICD-10-CM | POA: Diagnosis not present

## 2023-08-29 DIAGNOSIS — B952 Enterococcus as the cause of diseases classified elsewhere: Secondary | ICD-10-CM | POA: Diagnosis not present

## 2023-08-29 DIAGNOSIS — X58XXXA Exposure to other specified factors, initial encounter: Secondary | ICD-10-CM | POA: Diagnosis present

## 2023-08-29 DIAGNOSIS — M6281 Muscle weakness (generalized): Secondary | ICD-10-CM | POA: Diagnosis not present

## 2023-08-29 DIAGNOSIS — R9082 White matter disease, unspecified: Secondary | ICD-10-CM | POA: Diagnosis not present

## 2023-08-29 DIAGNOSIS — E222 Syndrome of inappropriate secretion of antidiuretic hormone: Principal | ICD-10-CM | POA: Diagnosis present

## 2023-08-29 DIAGNOSIS — Z9181 History of falling: Secondary | ICD-10-CM

## 2023-08-29 DIAGNOSIS — D692 Other nonthrombocytopenic purpura: Secondary | ICD-10-CM | POA: Diagnosis not present

## 2023-08-29 DIAGNOSIS — R519 Headache, unspecified: Secondary | ICD-10-CM | POA: Diagnosis not present

## 2023-08-29 DIAGNOSIS — R0602 Shortness of breath: Secondary | ICD-10-CM | POA: Diagnosis not present

## 2023-08-29 DIAGNOSIS — H353211 Exudative age-related macular degeneration, right eye, with active choroidal neovascularization: Secondary | ICD-10-CM | POA: Diagnosis not present

## 2023-08-29 DIAGNOSIS — Z9049 Acquired absence of other specified parts of digestive tract: Secondary | ICD-10-CM

## 2023-08-29 DIAGNOSIS — E860 Dehydration: Secondary | ICD-10-CM | POA: Diagnosis present

## 2023-08-29 DIAGNOSIS — E669 Obesity, unspecified: Secondary | ICD-10-CM | POA: Diagnosis present

## 2023-08-29 DIAGNOSIS — S2242XA Multiple fractures of ribs, left side, initial encounter for closed fracture: Secondary | ICD-10-CM | POA: Diagnosis not present

## 2023-08-29 DIAGNOSIS — I480 Paroxysmal atrial fibrillation: Secondary | ICD-10-CM | POA: Diagnosis not present

## 2023-08-29 DIAGNOSIS — Z955 Presence of coronary angioplasty implant and graft: Secondary | ICD-10-CM

## 2023-08-29 DIAGNOSIS — R531 Weakness: Secondary | ICD-10-CM | POA: Diagnosis not present

## 2023-08-29 DIAGNOSIS — E876 Hypokalemia: Secondary | ICD-10-CM | POA: Diagnosis present

## 2023-08-29 DIAGNOSIS — Z1152 Encounter for screening for COVID-19: Secondary | ICD-10-CM

## 2023-08-29 DIAGNOSIS — I6381 Other cerebral infarction due to occlusion or stenosis of small artery: Secondary | ICD-10-CM | POA: Diagnosis not present

## 2023-08-29 DIAGNOSIS — R0689 Other abnormalities of breathing: Secondary | ICD-10-CM | POA: Diagnosis not present

## 2023-08-29 DIAGNOSIS — Z6835 Body mass index (BMI) 35.0-35.9, adult: Secondary | ICD-10-CM

## 2023-08-29 DIAGNOSIS — Z8549 Personal history of malignant neoplasm of other male genital organs: Secondary | ICD-10-CM

## 2023-08-29 DIAGNOSIS — Z87891 Personal history of nicotine dependence: Secondary | ICD-10-CM | POA: Diagnosis not present

## 2023-08-29 DIAGNOSIS — I739 Peripheral vascular disease, unspecified: Secondary | ICD-10-CM | POA: Diagnosis not present

## 2023-08-29 DIAGNOSIS — M47817 Spondylosis without myelopathy or radiculopathy, lumbosacral region: Secondary | ICD-10-CM | POA: Diagnosis not present

## 2023-08-29 DIAGNOSIS — N319 Neuromuscular dysfunction of bladder, unspecified: Secondary | ICD-10-CM | POA: Diagnosis present

## 2023-08-29 DIAGNOSIS — R488 Other symbolic dysfunctions: Secondary | ICD-10-CM | POA: Diagnosis not present

## 2023-08-29 DIAGNOSIS — N179 Acute kidney failure, unspecified: Secondary | ICD-10-CM | POA: Diagnosis not present

## 2023-08-29 DIAGNOSIS — N183 Chronic kidney disease, stage 3 unspecified: Secondary | ICD-10-CM | POA: Diagnosis present

## 2023-08-29 DIAGNOSIS — Z79899 Other long term (current) drug therapy: Secondary | ICD-10-CM

## 2023-08-29 DIAGNOSIS — R102 Pelvic and perineal pain: Secondary | ICD-10-CM | POA: Diagnosis not present

## 2023-08-29 HISTORY — DX: Hypo-osmolality and hyponatremia: E87.1

## 2023-08-29 LAB — COMPREHENSIVE METABOLIC PANEL
ALT: 20 U/L (ref 0–44)
AST: 28 U/L (ref 15–41)
Albumin: 3.8 g/dL (ref 3.5–5.0)
Alkaline Phosphatase: 52 U/L (ref 38–126)
Anion gap: 11 (ref 5–15)
BUN: 40 mg/dL — ABNORMAL HIGH (ref 8–23)
CO2: 32 mmol/L (ref 22–32)
Calcium: 8.8 mg/dL — ABNORMAL LOW (ref 8.9–10.3)
Chloride: 79 mmol/L — ABNORMAL LOW (ref 98–111)
Creatinine, Ser: 1.67 mg/dL — ABNORMAL HIGH (ref 0.61–1.24)
GFR, Estimated: 37 mL/min — ABNORMAL LOW (ref >60)
Glucose, Bld: 127 mg/dL — ABNORMAL HIGH (ref 70–99)
Potassium: 2.7 mmol/L — CL (ref 3.5–5.1)
Sodium: 122 mmol/L — ABNORMAL LOW (ref 135–145)
Total Bilirubin: 1 mg/dL (ref 0.3–1.2)
Total Protein: 6.5 g/dL (ref 6.5–8.1)

## 2023-08-29 LAB — BASIC METABOLIC PANEL
Anion gap: 12 (ref 5–15)
Anion gap: 11 (ref 5–15)
BUN: 39 mg/dL — ABNORMAL HIGH (ref 8–23)
BUN: 40 mg/dL — ABNORMAL HIGH (ref 8–23)
CO2: 34 mmol/L — ABNORMAL HIGH (ref 22–32)
CO2: 33 mmol/L — ABNORMAL HIGH (ref 22–32)
Calcium: 8.9 mg/dL (ref 8.9–10.3)
Calcium: 8.7 mg/dL — ABNORMAL LOW (ref 8.9–10.3)
Chloride: 78 mmol/L — ABNORMAL LOW (ref 98–111)
Chloride: 80 mmol/L — ABNORMAL LOW (ref 98–111)
Creatinine, Ser: 1.7 mg/dL — ABNORMAL HIGH (ref 0.61–1.24)
Creatinine, Ser: 1.82 mg/dL — ABNORMAL HIGH (ref 0.61–1.24)
GFR, Estimated: 36 mL/min — ABNORMAL LOW (ref >60)
GFR, Estimated: 34 mL/min — ABNORMAL LOW (ref >60)
Glucose, Bld: 122 mg/dL — ABNORMAL HIGH (ref 70–99)
Glucose, Bld: 104 mg/dL — ABNORMAL HIGH (ref 70–99)
Potassium: 2.8 mmol/L — ABNORMAL LOW (ref 3.5–5.1)
Potassium: 3 mmol/L — ABNORMAL LOW (ref 3.5–5.1)
Sodium: 124 mmol/L — ABNORMAL LOW (ref 135–145)
Sodium: 124 mmol/L — ABNORMAL LOW (ref 135–145)

## 2023-08-29 LAB — CBC WITH DIFFERENTIAL/PLATELET
Abs Immature Granulocytes: 0.02 10*3/uL (ref 0.00–0.07)
Basophils Absolute: 0 10*3/uL (ref 0.0–0.1)
Basophils Relative: 0 %
Eosinophils Absolute: 0 10*3/uL (ref 0.0–0.5)
Eosinophils Relative: 0 %
HCT: 32 % — ABNORMAL LOW (ref 39.0–52.0)
Hemoglobin: 11 g/dL — ABNORMAL LOW (ref 13.0–17.0)
Immature Granulocytes: 0 %
Lymphocytes Relative: 9 %
Lymphs Abs: 0.7 10*3/uL (ref 0.7–4.0)
MCH: 32.4 pg (ref 26.0–34.0)
MCHC: 34.4 g/dL (ref 30.0–36.0)
MCV: 94.1 fL (ref 80.0–100.0)
Monocytes Absolute: 1.1 10*3/uL — ABNORMAL HIGH (ref 0.1–1.0)
Monocytes Relative: 15 %
Neutro Abs: 5.9 10*3/uL (ref 1.7–7.7)
Neutrophils Relative %: 76 %
Platelets: 184 10*3/uL (ref 150–400)
RBC: 3.4 MIL/uL — ABNORMAL LOW (ref 4.22–5.81)
RDW: 15.7 % — ABNORMAL HIGH (ref 11.5–15.5)
WBC: 7.8 10*3/uL (ref 4.0–10.5)
nRBC: 0 % (ref 0.0–0.2)

## 2023-08-29 LAB — RESP PANEL BY RT-PCR (RSV, FLU A&B, COVID)  RVPGX2
Influenza A by PCR: NEGATIVE
Influenza B by PCR: NEGATIVE
Resp Syncytial Virus by PCR: NEGATIVE
SARS Coronavirus 2 by RT PCR: NEGATIVE

## 2023-08-29 LAB — URINALYSIS, ROUTINE W REFLEX MICROSCOPIC
Bilirubin Urine: NEGATIVE
Glucose, UA: NEGATIVE mg/dL
Ketones, ur: NEGATIVE mg/dL
Nitrite: NEGATIVE
Protein, ur: NEGATIVE mg/dL
Specific Gravity, Urine: 1.004 — ABNORMAL LOW (ref 1.005–1.030)
pH: 6 (ref 5.0–8.0)

## 2023-08-29 LAB — MAGNESIUM: Magnesium: 3.8 mg/dL — ABNORMAL HIGH (ref 1.7–2.4)

## 2023-08-29 LAB — TSH: TSH: 2.133 u[IU]/mL (ref 0.350–4.500)

## 2023-08-29 LAB — OSMOLALITY: Osmolality: 275 mosm/kg (ref 275–295)

## 2023-08-29 LAB — SODIUM, URINE, RANDOM: Sodium, Ur: 62 mmol/L

## 2023-08-29 MED ORDER — ACETAMINOPHEN 650 MG RE SUPP: 650.0000 mg | Freq: Four times a day (QID) | RECTAL | Status: DC | PRN

## 2023-08-29 MED ORDER — ACETAMINOPHEN 325 MG PO TABS
650.0000 mg | ORAL_TABLET | Freq: Four times a day (QID) | ORAL | Status: DC | PRN
  Filled 2023-08-29: qty 2

## 2023-08-29 MED ORDER — POTASSIUM CHLORIDE 10 MEQ/100ML IV SOLN
10.0000 meq | Freq: Once | INTRAVENOUS | Status: AC
  Administered 2023-08-29: 10 meq via INTRAVENOUS
  Filled 2023-08-29: qty 100

## 2023-08-29 MED ORDER — POTASSIUM CHLORIDE CRYS ER 20 MEQ PO TBCR
40.0000 meq | EXTENDED_RELEASE_TABLET | Freq: Four times a day (QID) | ORAL | Status: AC
  Administered 2023-08-29 (×2): 40 meq via ORAL
  Filled 2023-08-29 (×2): qty 2

## 2023-08-29 MED ORDER — SODIUM CHLORIDE 0.9 % IV BOLUS
500.0000 mL | Freq: Once | INTRAVENOUS | Status: AC
  Administered 2023-08-29: 500 mL via INTRAVENOUS

## 2023-08-29 MED ORDER — POTASSIUM CHLORIDE CRYS ER 20 MEQ PO TBCR
40.0000 meq | EXTENDED_RELEASE_TABLET | Freq: Once | ORAL | Status: AC
  Administered 2023-08-29: 40 meq via ORAL
  Filled 2023-08-29: qty 2

## 2023-08-29 MED ORDER — SODIUM CHLORIDE 0.9% FLUSH
3.0000 mL | Freq: Two times a day (BID) | INTRAVENOUS | Status: DC
  Administered 2023-08-30 – 2023-09-01 (×5): 3 mL via INTRAVENOUS

## 2023-08-29 MED ORDER — ADULT MULTIVITAMIN W/MINERALS CH
1.0000 | ORAL_TABLET | Freq: Every day | ORAL | Status: DC
  Administered 2023-08-29 – 2023-09-01 (×4): 1 via ORAL
  Filled 2023-08-29 (×4): qty 1

## 2023-08-29 MED ORDER — MAGNESIUM SULFATE 2 GM/50ML IV SOLN
2.0000 g | Freq: Once | INTRAVENOUS | Status: AC
  Administered 2023-08-29: 2 g via INTRAVENOUS
  Filled 2023-08-29: qty 50

## 2023-08-29 MED ORDER — PANTOPRAZOLE SODIUM 40 MG PO TBEC
40.0000 mg | DELAYED_RELEASE_TABLET | Freq: Every day | ORAL | Status: DC
  Administered 2023-08-29 – 2023-09-01 (×4): 40 mg via ORAL
  Filled 2023-08-29 (×4): qty 1

## 2023-08-29 MED ORDER — METOPROLOL TARTRATE 25 MG PO TABS
12.5000 mg | ORAL_TABLET | Freq: Two times a day (BID) | ORAL | Status: DC
  Administered 2023-08-29 – 2023-09-01 (×6): 12.5 mg via ORAL
  Filled 2023-08-29 (×6): qty 1

## 2023-08-29 NOTE — H&P (Signed)
History and Physical    Patient: Caleb [name] WUJ:[id number] DOB: 1925/08/27 DOA: 08/29/2023 DOS: the patient was seen and examined on 08/29/2023 PCP: [name], MD  Patient coming from: Home  Chief Complaint:  Chief Complaint  Patient presents with   Fatigue   HPI: Caleb [name] is a 89 y.o. male with a history of CAD s/p CABG, PVD, HLD, obesity, and penile CA with chronic indwelling foley who presented to the ED on the advice of his caregiver for fatigue. The patient is not completely clear on why he came to the ED. He denies fever, chills, night sweats, HA, vision changes, neck pain/stiffness that is new, chest pain, dyspnea, orthopnea, PND, abd pain, N/V/D. Has good appetite and drinks water "right much." He doesn't eat as much as he'd like because his caregiver can't cook well (he says).  There have been multiple reported falls, the patient isn't sure and says he gets around in a wheelchair. Per EMR, during a catheter exchange in the urology office on 8/21 he reported dysuria, was found to have UTI and had macrobid called in for this (to which the urine culture Enterobacter is sensitive). He can't tell me what medications he takes but his urine here showed rare bacteria and no WBCs. On arrival he's afebrile with normal vital signs. His metabolic profile identified severe hyponatremia (Na 122) and hypokalemia (K 2.7) with elevated Cr (1.7) which is at/better than his baseline.   Admission was requested. 500cc NS has been given.  Review of Systems: As mentioned in the history of present illness. All other systems reviewed and are negative. Past Medical History:  Diagnosis Date   Coronary artery disease    Hyperlipidemia    Obesity    Penile cancer (HCC)    Swelling of extremity    Left Leg   Past Surgical History:  Procedure Laterality Date   APPENDECTOMY     BACK SURGERY     BIOPSY  07/22/2021   Procedure: BIOPSY;  Surgeon: Lynann Bologna, MD;  Location: [facility] ENDOSCOPY;  Service:  Gastroenterology;;   CHOLECYSTECTOMY     CORONARY ANGIOPLASTY  11/2005   RCA PCI AND STENTING WITH A CYPHER DES   CORONARY ARTERY BYPASS GRAFT     ESOPHAGOGASTRODUODENOSCOPY (EGD) WITH PROPOFOL N/A 07/22/2021   Procedure: ESOPHAGOGASTRODUODENOSCOPY (EGD) WITH PROPOFOL;  Surgeon: Lynann Bologna, MD;  Location: [facility] ENDOSCOPY;  Service: Gastroenterology;  Laterality: N/A;   HIATAL HERNIA REPAIR     lymph node removal     NM MYOCAR MULTIPLE W/SPECT  11/26/2009   EF 62%. NORMAL MYOCARDIAL PERFUSION STUDY.   TRANSTHORACIC ECHOCARDIOGRAM  12/22/2005   MILD TO MOD AORTIC SCLEROSIS W/O STENOSIS. MILD TO MODERATE MITRAL CALCIFICATION. LA- MILDLY DILATED.   Social History:  reports that he quit smoking about 40 years ago. His smoking use included cigarettes. He has never used smokeless tobacco. He reports that he does not drink alcohol and does not use drugs.  No Known Allergies  Family History  Problem Relation Age of Onset   Cancer Father    Cancer Sister    Cancer Brother    Cancer Sister    Cancer Other     Prior to Admission medications   Medication Sig Start Date End Date Taking? Authorizing Provider  Calcium Carbonate Antacid (TUMS PO) Take 1 tablet by mouth as needed (heartburn).    [provider]  Calcium Carbonate-Vitamin D (CALCIUM-D PO) Take 1 tablet by mouth daily.    [provider]  celecoxib (CELEBREX) 100 MG capsule Take 1 capsule (100 mg total) by mouth daily. 07/08/23   Orpah Cobb, MD  cephALEXin (KEFLEX) 500 MG capsule Take 1 capsule (500 mg total) by mouth every 12 (twelve) hours. 07/08/23   Orpah Cobb, MD  magnesium hydroxide (MILK OF MAGNESIA) 400 MG/5ML suspension Take 30 mLs by mouth daily as needed for mild constipation. 07/08/23   Orpah Cobb, MD  metoprolol tartrate (LOPRESSOR) 25 MG tablet Take 0.5 tablets (12.5 mg total) by mouth 2 (two) times daily. 07/08/23   Orpah Cobb, MD  Multiple Vitamin (MULTIVITAMIN WITH MINERALS) TABS tablet Take 1  tablet by mouth daily. Centrum Silver    [provider]  Multiple Vitamins-Minerals (PRESERVISION AREDS 2) CAPS Take 1 tablet by mouth daily.    [provider]  nitrofurantoin, macrocrystal-monohydrate, (MACROBID) 100 MG capsule Take 1 capsule (100 mg total) by mouth 2 (two) times daily. 08/25/23   McKenzie, Mardene Celeste, MD  pantoprazole (PROTONIX) 40 MG tablet Take 1 tablet (40 mg total) by mouth daily. 07/09/23   Orpah Cobb, MD  polyethylene glycol powder (GLYCOLAX/MIRALAX) 17 GM/SCOOP powder Take 17 g by mouth daily. Patient taking differently: Take 17 g by mouth as needed for mild constipation or moderate constipation. 09/23/21   Medina-Vargas, Monina C, NP  simvastatin (ZOCOR) 20 MG tablet Take 1 tablet (20 mg total) by mouth at bedtime. Patient not taking: Reported on 07/03/2023 09/23/21   Medina-Vargas, Monina C, NP  spironolactone (ALDACTONE) 25 MG tablet Take 0.5 tablets (12.5 mg total) by mouth daily. 07/09/23   Orpah Cobb, MD    Physical Exam: Vitals:   08/29/23 1230 08/29/23 1315 08/29/23 1330 08/29/23 1506  BP: (!) 113/97 (!) 140/84 136/83 (!) 109/56  Pulse: 68 92 87 94  Resp: 15 15 14 18   Temp:    97.9 F (36.6 C)  TempSrc:    Oral  SpO2: 99% 99% 97% 97%  Weight:      Height:      Elderly WDWN male in no distress Clear, nonlabored RRR, no MRG. 1+ R > LLE pitting edema with venous stasis changes bilaterally lower legs. Pt reports this is at baseline chronically.  Diffuse UE ecchymoses and focal hematoma on anteromedial left lower leg that is no significantly tender.  Alert, oriented to person, place. Moves all extremities.   Data Reviewed: ECG: NSR with prolonged PR interval and abnormal QRS morphology indicative of trifascicular block. This is similar to 7/4 ECG. CXR: Chronic interstitial coarsening, no infiltrate or effusion. L shoulder deformity is not new.  CT head: Nonacute chronic microvascular ischemic disease.  XR pelvis: Negative  Assessment  and Plan: Severe hyponatremia: Known to have been low in the past (121 on last admission), so presumed to be chronic. No nodule/mass on CXR.  - Check urine sodium >> this is 62. He appears roughly euvolemic, will check TSH, AM cortisol, suspect SIADH.  - Institute fluid restriction and otherwise regular diet. No further IVF planned as it would be easy to overload him. Sodium has come up a bit, so also an element of dehydration which would also explain hypokalemia due to poor oral intake/tea and toast diet. Don't want to abruptly correct as he's asymptomatic.  - Recheck BMP serially - Recheck urine sodium, serum osm in AM  Hypokalemia: Only slightly improved with supplementation. Mg said to be 3.8.Marland Kitchen. - Supplement K orally - Recheck BMP serially as above.   CAD: No anginal complaints.  - Continue home metoprolol. Antiplatelet not on  med list, caregiver reported not taking simvastatin any longer. With his bruising, mild anemia, hematoma, and fall risk, we won't start an antiplatelet at this time.   History of paroxysmal AFib: Currently in NSR.  - Not on anticoagulation presumably due to GI bleed 2022.  - Continue metoprolol.   Falls: Negative pelvis XR, neuroimaging. Scattered ecchymoses consistent with senile purpura though sizeable hematoma on left shin suggest trauma of some kind. - Will get PT and OT evaluations, institute fall precautions - Monitor telemetry  Stage IIIb CKD:  - Avoid nephrotoxins, holding spironolactone at this time.   DNR: POA. When initiating code status discussion, the patient states plainly "do you know how old I am." And agrees to DNAR in the setting of cardiac/respiratory arrest.   Chronic foley catheter due to neurogenic bladder:  - Maintain foley. Exchange per routine with urology clinic.   GERD:  - PPI   Advance Care Planning: DNR  Consults: None  Family Communication: None at bedside  Severity of Illness: The appropriate patient status for this  patient is INPATIENT. Inpatient status is judged to be reasonable and necessary in order to provide the required intensity of service to ensure the patient's safety. The patient's presenting symptoms, physical exam findings, and initial radiographic and laboratory data in the context of their chronic comorbidities is felt to place them at high risk for further clinical deterioration. Furthermore, it is not anticipated that the patient will be medically stable for discharge from the hospital within 2 midnights of admission.   * I certify that at the point of admission it is my clinical judgment that the patient will require inpatient hospital care spanning beyond 2 midnights from the point of admission due to high intensity of service, high risk for further deterioration and high frequency of surveillance required.*  Author: Tyrone Nine, MD 08/29/2023 3:46 PM  For on call review [email or web address].

## 2023-08-29 NOTE — ED Notes (Signed)
Patient transported to CT 

## 2023-08-29 NOTE — ED Notes (Signed)
Pt provided with applesauce and banana.

## 2023-08-29 NOTE — ED Triage Notes (Signed)
Pt family wants pt checked for a UTI as he has been having multiple falls recently.  Pt live in CNA says pt lets the [place] get too far away form him and then he just goes down.

## 2023-08-29 NOTE — Plan of Care (Signed)

## 2023-08-29 NOTE — ED Notes (Signed)
EDP at bedside  

## 2023-08-29 NOTE — Plan of Care (Signed)
  Problem: Education: Goal: Knowledge of General Education information will improve Description Including pain rating scale, medication(s)/side effects and non-pharmacologic comfort measures Outcome: Progressing   

## 2023-08-29 NOTE — ED Provider Notes (Signed)
[place] EMERGENCY DEPARTMENT AT [facility] Provider Note   CSN: [id number] Arrival date & time: 08/29/23  1112     History {Add pertinent medical, surgical, social history, OB history to HPI:1} Chief Complaint  Patient presents with   Fatigue    Caleb [name] is a 89 y.o. male.  Patient has a history of coronary artery disease and hyperlipidemia.  He is becoming weak and has fallen a few times.   Weakness      Home Medications Prior to Admission medications   Medication Sig Start Date End Date Taking? Authorizing Provider  Calcium Carbonate Antacid (TUMS PO) Take 1 tablet by mouth as needed (heartburn).    [provider]  Calcium Carbonate-Vitamin D (CALCIUM-D PO) Take 1 tablet by mouth daily.    [provider]  celecoxib (CELEBREX) 100 MG capsule Take 1 capsule (100 mg total) by mouth daily. 07/08/23   Orpah Cobb, MD  cephALEXin (KEFLEX) 500 MG capsule Take 1 capsule (500 mg total) by mouth every 12 (twelve) hours. 07/08/23   Orpah Cobb, MD  magnesium hydroxide (MILK OF MAGNESIA) 400 MG/5ML suspension Take 30 mLs by mouth daily as needed for mild constipation. 07/08/23   Orpah Cobb, MD  metoprolol tartrate (LOPRESSOR) 25 MG tablet Take 0.5 tablets (12.5 mg total) by mouth 2 (two) times daily. 07/08/23   Orpah Cobb, MD  Multiple Vitamin (MULTIVITAMIN WITH MINERALS) TABS tablet Take 1 tablet by mouth daily. Centrum Silver    [provider]  Multiple Vitamins-Minerals (PRESERVISION AREDS 2) CAPS Take 1 tablet by mouth daily.    [provider]  nitrofurantoin, macrocrystal-monohydrate, (MACROBID) 100 MG capsule Take 1 capsule (100 mg total) by mouth 2 (two) times daily. 08/25/23   McKenzie, Mardene Celeste, MD  pantoprazole (PROTONIX) 40 MG tablet Take 1 tablet (40 mg total) by mouth daily. 07/09/23   Orpah Cobb, MD  polyethylene glycol powder (GLYCOLAX/MIRALAX) 17 GM/SCOOP powder Take 17 g by mouth daily. Patient taking  differently: Take 17 g by mouth as needed for mild constipation or moderate constipation. 09/23/21   Medina-Vargas, Monina C, NP  simvastatin (ZOCOR) 20 MG tablet Take 1 tablet (20 mg total) by mouth at bedtime. Patient not taking: Reported on 07/03/2023 09/23/21   Medina-Vargas, Monina C, NP  spironolactone (ALDACTONE) 25 MG tablet Take 0.5 tablets (12.5 mg total) by mouth daily. 07/09/23   Orpah Cobb, MD      Allergies    Patient has no known allergies.    Review of Systems   Review of Systems  Neurological:  Positive for weakness.    Physical Exam Updated Vital Signs BP (!) 140/84   Pulse 92   Temp 98 F (36.7 C) (Oral)   Resp 15   Ht 5\' 10"  (1.778 m)   Wt 113.4 kg   SpO2 99%   BMI 35.87 kg/m  Physical Exam  ED Results / Procedures / Treatments   Labs (all labs ordered are listed, but only abnormal results are displayed) Labs Reviewed  CBC WITH DIFFERENTIAL/PLATELET - Abnormal; Notable for the following components:      Result Value   RBC 3.40 (*)    Hemoglobin 11.0 (*)    HCT 32.0 (*)    RDW 15.7 (*)    Monocytes Absolute 1.1 (*)    All other components within normal limits  COMPREHENSIVE METABOLIC PANEL - Abnormal; Notable for the following components:   Sodium 122 (*)    Potassium 2.7 (*)  Chloride 79 (*)    Glucose, Bld 127 (*)    BUN 40 (*)    Creatinine, Ser 1.67 (*)    Calcium 8.8 (*)    GFR, Estimated 37 (*)    All other components within normal limits  URINALYSIS, ROUTINE W REFLEX MICROSCOPIC - Abnormal; Notable for the following components:   Color, Urine STRAW (*)    Specific Gravity, Urine 1.004 (*)    Hgb urine dipstick SMALL (*)    Leukocytes,Ua SMALL (*)    Bacteria, UA RARE (*)    All other components within normal limits  RESP PANEL BY RT-PCR (RSV, FLU A&B, COVID)  RVPGX2  SODIUM, URINE, RANDOM  OSMOLALITY  BASIC METABOLIC PANEL  MAGNESIUM    EKG None  Radiology DG Pelvis Portable  Result Date: 08/29/2023 CLINICAL DATA:   Shortness of breath, fall with pain. EXAM: PORTABLE PELVIS 1-2 VIEWS COMPARISON:  Hip radiographs dated 07/11/2021. FINDINGS: There is no evidence of pelvic fracture or diastasis. Degenerative changes are seen in the spine. Mild bilateral hip osteoarthritis is noted. IMPRESSION: No acute osseous injury. Electronically Signed   By: Romona Curls M.D.   On: 08/29/2023 12:30   DG Chest Port 1 View  Result Date: 08/29/2023 CLINICAL DATA:  Shortness of breath. EXAM: PORTABLE CHEST 1 VIEW COMPARISON:  07/01/2023 FINDINGS: Low volume film. The cardio pericardial silhouette is enlarged. The lungs are clear without focal pneumonia, edema, pneumothorax or pleural effusion. Interstitial markings are diffusely coarsened with chronic features. Nonacute left-sided rib fractures again noted. Chronic posttraumatic deformity in the left shoulder is stable. IMPRESSION: No acute cardiopulmonary findings. Electronically Signed   By: Kennith Center M.D.   On: 08/29/2023 12:29   CT Head Wo Contrast  Result Date: 08/29/2023 CLINICAL DATA:  Headache with increasing frequency.  Multiple falls. EXAM: CT HEAD WITHOUT CONTRAST TECHNIQUE: Contiguous axial images were obtained from the base of the skull through the vertex without intravenous contrast. RADIATION DOSE REDUCTION: This exam was performed according to the departmental dose-optimization program which includes automated exposure control, adjustment of the mA and/or kV according to patient size and/or use of iterative reconstruction technique. COMPARISON:  None Available.  07/11/2021 FINDINGS: Brain: There is no evidence for acute hemorrhage, hydrocephalus, mass lesion, or abnormal extra-axial fluid collection. No definite CT evidence for acute infarction. Diffuse loss of parenchymal volume is consistent with atrophy. Patchy low attenuation in the deep hemispheric and periventricular white matter is nonspecific, but likely reflects chronic microvascular ischemic demyelination.  Stable lacunar infarct in the left thalamus. Vascular: No hyperdense vessel or unexpected calcification. Skull: No evidence for fracture. No worrisome lytic or sclerotic lesion. Sinuses/Orbits: The visualized paranasal sinuses and mastoid air cells are clear. Visualized portions of the globes and intraorbital fat are unremarkable. Other: None. IMPRESSION: 1. No acute intracranial abnormality. 2. Atrophy with chronic small vessel white matter ischemic disease. Electronically Signed   By: Kennith Center M.D.   On: 08/29/2023 12:11    Procedures Procedures  {Document cardiac monitor, telemetry assessment procedure when appropriate:1}  Medications Ordered in ED Medications  potassium chloride 10 mEq in 100 mL IVPB (has no administration in time range)  potassium chloride 10 mEq in 100 mL IVPB (10 mEq Intravenous New Bag/Given 08/29/23 1252)  potassium chloride SA (KLOR-CON M) CR tablet 40 mEq (has no administration in time range)  magnesium sulfate IVPB 2 g 50 mL (has no administration in time range)  sodium chloride 0.9 % bolus 500 mL (500 mLs Intravenous New Bag/Given  08/29/23 1230)    ED Course/ Medical Decision Making/ A&P   {  CRITICAL CARE Performed by: Bethann Berkshire Total critical care time: 45 minutes Critical care time was exclusive of separately billable procedures and treating other patients. Critical care was necessary to treat or prevent imminent or life-threatening deterioration. Critical care was time spent personally by me on the following activities: development of treatment plan with patient and/or surrogate as well as nursing, discussions with consultants, evaluation of patient's response to treatment, examination of patient, obtaining history from patient or surrogate, ordering and performing treatments and interventions, ordering and review of laboratory studies, ordering and review of radiographic studies, pulse oximetry and re-evaluation of patient's condition.]  Click here for  ABCD2, HEART and other calculatorsREFRESH Note before signing :1}                              Medical Decision Making Amount and/or Complexity of Data Reviewed Labs: ordered. Radiology: ordered. ECG/medicine tests: ordered.  Risk Prescription drug management. Decision regarding hospitalization.   Patient with hyponatremia and hypokalemia.  He will be admitted to medicine  {Document critical care time when appropriate:1} {Document review of labs and clinical decision tools ie heart score, Chads2Vasc2 etc:1}  {Document your independent review of radiology images, and any outside records:1} {Document your discussion with family members, caretakers, and with consultants:1} {Document social determinants of health affecting pt's care:1} {Document your decision making why or why not admission, treatments were needed:1} Final Clinical Impression(s) / ED Diagnoses Final diagnoses:  Hyponatremia  Hypokalemia    Rx / DC Orders ED Discharge Orders     None

## 2023-08-30 DIAGNOSIS — E871 Hypo-osmolality and hyponatremia: Secondary | ICD-10-CM | POA: Diagnosis not present

## 2023-08-30 LAB — OSMOLALITY: Osmolality: 279 mosm/kg (ref 275–295)

## 2023-08-30 LAB — BASIC METABOLIC PANEL
Anion gap: 9 (ref 5–15)
Anion gap: 9 (ref 5–15)
BUN: 39 mg/dL — ABNORMAL HIGH (ref 8–23)
BUN: 40 mg/dL — ABNORMAL HIGH (ref 8–23)
CO2: 32 mmol/L (ref 22–32)
CO2: 32 mmol/L (ref 22–32)
Calcium: 9 mg/dL (ref 8.9–10.3)
Calcium: 8.8 mg/dL — ABNORMAL LOW (ref 8.9–10.3)
Chloride: 84 mmol/L — ABNORMAL LOW (ref 98–111)
Chloride: 84 mmol/L — ABNORMAL LOW (ref 98–111)
Creatinine, Ser: 1.84 mg/dL — ABNORMAL HIGH (ref 0.61–1.24)
Creatinine, Ser: 1.9 mg/dL — ABNORMAL HIGH (ref 0.61–1.24)
GFR, Estimated: 33 mL/min — ABNORMAL LOW (ref >60)
GFR, Estimated: 32 mL/min — ABNORMAL LOW (ref >60)
Glucose, Bld: 89 mg/dL (ref 70–99)
Glucose, Bld: 114 mg/dL — ABNORMAL HIGH (ref 70–99)
Potassium: 3.3 mmol/L — ABNORMAL LOW (ref 3.5–5.1)
Potassium: 3.5 mmol/L (ref 3.5–5.1)
Sodium: 125 mmol/L — ABNORMAL LOW (ref 135–145)
Sodium: 125 mmol/L — ABNORMAL LOW (ref 135–145)

## 2023-08-30 LAB — CORTISOL-AM, BLOOD: Cortisol - AM: 13.6 ug/dL (ref 6.7–22.6)

## 2023-08-30 LAB — SODIUM, URINE, RANDOM: Sodium, Ur: 30 mmol/L

## 2023-08-30 MED ORDER — ORAL CARE MOUTH RINSE: 15.0000 mL | OROMUCOSAL | Status: DC | PRN

## 2023-08-30 MED ORDER — SODIUM CHLORIDE 1 G PO TABS
1.0000 g | ORAL_TABLET | Freq: Three times a day (TID) | ORAL | Status: DC
  Administered 2023-08-30 – 2023-09-01 (×7): 1 g via ORAL
  Filled 2023-08-30 (×7): qty 1

## 2023-08-30 MED ORDER — POTASSIUM CHLORIDE CRYS ER 20 MEQ PO TBCR
40.0000 meq | EXTENDED_RELEASE_TABLET | Freq: Four times a day (QID) | ORAL | Status: AC
  Administered 2023-08-30 (×2): 40 meq via ORAL
  Filled 2023-08-30 (×2): qty 2

## 2023-08-30 MED ORDER — CHLORHEXIDINE GLUCONATE CLOTH 2 % EX PADS
6.0000 | MEDICATED_PAD | Freq: Every day | CUTANEOUS | Status: DC
  Administered 2023-08-31 – 2023-09-01 (×3): 6 via TOPICAL

## 2023-08-30 NOTE — Evaluation (Signed)
Occupational Therapy Evaluation Patient Details Name: Caleb [name] MRN: [id number] DOB: 1925/12/13 Today's Date: 08/30/2023   History of Present Illness PAU ZELDIN is a 89 y.o. male with a history of CAD s/p CABG, PVD, HLD, obesity, and penile CA with chronic indwelling foley who presented to the ED on the advice of his caregiver for fatigue. The patient is not completely clear on why he came to the ED. He denies fever, chills, night sweats, HA, vision changes, neck pain/stiffness that is new, chest pain, dyspnea, orthopnea, PND, abd pain, N/V/D. Has good appetite and drinks water "right much." He doesn't eat as much as he'd like because his caregiver can't cook well (he says).  There have been multiple reported falls, the patient isn't sure and says he gets around in a wheelchair. Per EMR, during a catheter exchange in the urology office on 8/21 he reported dysuria, was found to have UTI and had macrobid called in for this (to which the urine culture Enterobacter is sensitive). He can't tell me what medications he takes but his urine here showed rare bacteria and no WBCs. On arrival he's afebrile with normal vital signs. His metabolic profile identified severe hyponatremia (Na 122) and hypokalemia (K 2.7) with elevated Cr (1.7) which is at/better than his baseline. (per MD)   Clinical Impression   Pt agreeable to OT and PT co-evaluation. Pt assisted for most ADL's at baseline but able to ambulate with RW independently at times. Today pt required mod to max A for bed mobility and max A for sit to stand with RW. Pt was very unsteady in standing and needing max lower body ADL assist. Pt was left in bed with visitor present. Pt will benefit from continued OT in the hospital and recommended venue below to increase strength, balance, and endurance for safe ADL's.          If plan is discharge home, recommend the following: A lot of help with walking and/or transfers;A lot of help with  bathing/dressing/bathroom;Assistance with cooking/housework;Assist for transportation;Help with stairs or ramp for entrance    Functional Status Assessment  Patient has had a recent decline in their functional status and demonstrates the ability to make significant improvements in function in a reasonable and predictable amount of time.  Equipment Recommendations  None recommended by OT           Precautions / Restrictions Precautions Precautions: Fall Restrictions Weight Bearing Restrictions: No      Mobility Bed Mobility Overal bed mobility: Needs Assistance Bed Mobility: Rolling, Supine to Sit, Sit to Supine Rolling: Mod assist   Supine to sit: Mod assist, Max assist Sit to supine: Mod assist, Max assist   General bed mobility comments: labored effort; much assist    Transfers Overall transfer level: Needs assistance Equipment used: Rolling Serrao (2 wheels) Transfers: Sit to/from Stand Sit to Stand: Max assist           General transfer comment: x2 reps of attempted sit to stand. Only able to stand on the 2nd attempt. Very unsteady in standing.      Balance Overall balance assessment: Needs assistance Sitting-balance support: No upper extremity supported, Feet supported Sitting balance-Leahy Scale: Fair Sitting balance - Comments: seated at EOB   Standing balance support: Bilateral upper extremity supported, During functional activity, Reliant on assistive device for balance Standing balance-Leahy Scale: Poor Standing balance comment: using RW  ADL either performed or assessed with clinical judgement   ADL Overall ADL's : Needs assistance/impaired Eating/Feeding: Set up;Sitting   Grooming: Contact guard assist;Set up;Sitting   Upper Body Bathing: Contact guard assist;Minimal assistance;Sitting   Lower Body Bathing: Maximal assistance;Total assistance;Bed level   Upper Body Dressing : Contact guard assist;Minimal  assistance;Sitting   Lower Body Dressing: Maximal assistance;Total assistance;Sitting/lateral leans   Toilet Transfer: Maximal assistance;Rolling Rafter (2 wheels);Stand-pivot Statistician Details (indicate cue type and reason): Partially simulated via sit to stand from EOB. Toileting- Clothing Manipulation and Hygiene: Total assistance;Bed level Toileting - Clothing Manipulation Details (indicate cue type and reason): Assisted in peri-care at bed level.     Functional mobility during ADLs: Maximal assistance;Rolling Pitcher (2 wheels)       Vision Baseline Vision/History: 1 Wears glasses Ability to See in Adequate Light: 1 Impaired Patient Visual Report: No change from baseline Vision Assessment?: No apparent visual deficits     Perception Perception: Not tested       Praxis Praxis: Not tested       Pertinent Vitals/Pain Pain Assessment Pain Assessment: No/denies pain     Extremity/Trunk Assessment Upper Extremity Assessment Upper Extremity Assessment: Generalized weakness (Mild shoulder flexion end range A/ROM limitations bilaterally.)   Lower Extremity Assessment Lower Extremity Assessment: Defer to PT evaluation   Cervical / Trunk Assessment Cervical / Trunk Assessment: Kyphotic   Communication Communication Communication: No apparent difficulties   Cognition Arousal: Alert Behavior During Therapy: WFL for tasks assessed/performed Overall Cognitive Status: Within Functional Limits for tasks assessed                                                        Home Living Family/patient expects to be discharged to:: Private residence Living Arrangements: Non-relatives/Friends Available Help at Discharge: Available 24 hours/day;Personal care attendant Type of Home: House Home Access: Stairs to enter Secretary/administrator of Steps: 2 Entrance Stairs-Rails: Right;Left;Can reach both Home Layout: One level     Bathroom Shower/Tub:  Producer, television/film/video: Handicapped height     Home Equipment: Rollator (4 wheels);Cane - single point;Grab bars - tub/shower   Additional Comments: Pt reports living in the same place as prior history.      Prior Functioning/Environment Prior Level of Function : Needs assist       Physical Assist : Mobility (physical);ADLs (physical)   ADLs (physical): Bathing;Dressing;Toileting;IADLs Mobility Comments: used rollator at all times ADLs Comments: Assist for all ADL's except grooming and feeding.        OT Problem List: Decreased strength;Decreased range of motion;Decreased activity tolerance;Impaired balance (sitting and/or standing)      OT Treatment/Interventions: Self-care/ADL training;Therapeutic exercise;Therapeutic activities;Balance training;Patient/family education    OT Goals(Current goals can be found in the care plan section) Acute Rehab OT Goals Patient Stated Goal: return home OT Goal Formulation: With patient Time For Goal Achievement: 09/13/23 Potential to Achieve Goals: Good  OT Frequency: Min 2X/week    Co-evaluation PT/OT/SLP Co-Evaluation/Treatment: Yes Reason for Co-Treatment: To address functional/ADL transfers   OT goals addressed during session: ADL's and self-care      AM-PAC OT "6 Clicks" Daily Activity     Outcome Measure                 End of Session Equipment Utilized During Treatment: Rolling Politano (  2 wheels);Gait belt  Activity Tolerance: Patient tolerated treatment well Patient left: in bed;with call bell/phone within reach;with family/visitor present  OT Visit Diagnosis: Unsteadiness on feet (R26.81);Other abnormalities of gait and mobility (R26.89);Muscle weakness (generalized) (M62.81);History of falling (Z91.81)                Time: 1478-2956 OT Time Calculation (min): 24 min Charges:  OT General Charges $OT Visit: 1 Visit OT Evaluation $OT Eval Low Complexity: 1 Low  Jalaiya Oyster OT,  MOT  Danie Chandler 08/30/2023, 10:22 AM

## 2023-08-30 NOTE — Plan of Care (Signed)
  Problem: Acute Rehab PT Goals(only PT should resolve) Goal: Pt will Roll Supine to Side Flowsheets (Taken 08/30/2023 1058) Pt will Roll Supine to Side: with supervision Goal: Pt Will Go Sit To Supine/Side Flowsheets (Taken 08/30/2023 1058) Pt will go Sit to Supine/Side: with contact guard assist Goal: Patient Will Perform Sitting Balance Flowsheets (Taken 08/30/2023 1058) Patient will perform sitting balance: with supervision Goal: Patient Will Transfer Sit To/From Stand Flowsheets (Taken 08/30/2023 1058) Patient will transfer sit to/from stand: with minimal assist Note: +2 Goal: Pt Will Transfer Bed To Chair/Chair To Bed Flowsheets (Taken 08/30/2023 1058) Pt will Transfer Bed to Chair/Chair to Bed: with min assist Note: +2 Goal: Pt Will Perform Standing Balance Or Pre-Gait Flowsheets (Taken 08/30/2023 1058) Pt will perform standing balance or pre-gait: with minimal assist Note: +2 Goal: Pt Will Ambulate Flowsheets (Taken 08/30/2023 1058) Pt will Ambulate:  10 feet  with +2  with rolling Huard Nelida Meuse PT, DPT Physical Therapist with [facility] [phone] office

## 2023-08-30 NOTE — Plan of Care (Signed)
  Problem: Acute Rehab OT Goals (only OT should resolve) Goal: Pt. Will Perform Grooming Flowsheets (Taken 08/30/2023 1023) Pt Will Perform Grooming:  with set-up  sitting  with modified independence Goal: Pt. Will Perform Upper Body Bathing Flowsheets (Taken 08/30/2023 1023) Pt Will Perform Upper Body Bathing:  with set-up  sitting Goal: Pt. Will Perform Upper Body Dressing Flowsheets (Taken 08/30/2023 1023) Pt Will Perform Upper Body Dressing:  with set-up  sitting Goal: Pt. Will Transfer To Toilet Flowsheets (Taken 08/30/2023 1023) Pt Will Transfer to Toilet:  with min assist  stand pivot transfer Goal: Pt. Will Perform Toileting-Clothing Manipulation Flowsheets (Taken 08/30/2023 1023) Pt Will Perform Toileting - Clothing Manipulation and hygiene:  with mod assist  bed level  sitting/lateral leans Goal: Pt/Caregiver Will Perform Home Exercise Program Flowsheets (Taken 08/30/2023 1023) Pt/caregiver will Perform Home Exercise Program:  Increased strength  Increased ROM  Both right and left upper extremity  Independently  Jayani Rozman OT, MOT

## 2023-08-30 NOTE — TOC Initial Note (Signed)
Transition of Care [facility]) - Initial/Assessment Note    Patient Details  Name: Caleb [name] MRN: [id number] Date of Birth: [birth year]  [facility] CM/SW Contact:    Karn Cassis, LCSW Phone Number: 08/30/2023, 11:38 AM  Clinical Narrative: Pt admitted due to severe hyponatremia. Assessment completed with pt who reports he has a paid caregiver, Melissa who lives with him. He primarily uses wheelchair. Pt is active with [facility] HHPT/RN. Discussed PT/OT recommendation for SNF and pt states he wants to return home, but requested that LCSW speak with Efraim Kaufmann and his son, Annette Stable. Melissa states pt typically does more for her than other people and she is agreeable to return home if that is pt's preference. Annette Stable also wants return home. Morrie Sheldon with [facility] confirms pt active. Will add OT.                   Expected Discharge Plan: Home w Home Health Services Barriers to Discharge: Continued Medical Work up   Patient Goals and CMS Choice Patient states their goals for this hospitalization and ongoing recovery are:: return home   Choice offered to / list presented to : Patient, Adult Children [place] ownership interest in [facility].provided to::  (declines SNF)    Expected Discharge Plan and Services In-house Referral: Clinical Social Work     Living arrangements for the past 2 months: Single Family Home                           HH Arranged: PT, RN, Nurse's Aide HH Agency: [facility] Date HH Agency Contacted: 08/30/23 Time HH Agency Contacted: 1137 Representative spoke with at [facility] Agency: Morrie Sheldon  Prior Living Arrangements/Services Living arrangements for the past 2 months: Single Family Home Lives with:: Self Patient language and need for interpreter reviewed:: Yes Do you feel safe going back to the place where you live?: Yes      Need for Family Participation in Patient Care: Yes (Comment) Care giver support system in place?: Yes  (comment) Current home services: DME, Home PT, Home RN (wheelchair, hospital bed) Criminal Activity/Legal Involvement Pertinent to Current Situation/Hospitalization: No - Comment as needed  Activities of Daily Living Home Assistive Devices/Equipment: Wheelchair, Environmental consultant (specify type) ADL Screening (condition at time of admission) Patient's cognitive ability adequate to safely complete daily activities?: Yes Is the patient deaf or have difficulty hearing?: No Does the patient have difficulty seeing, even when wearing glasses/contacts?: No Does the patient have difficulty concentrating, remembering, or making decisions?: No Patient able to express need for assistance with ADLs?: Yes Does the patient have difficulty dressing or bathing?: Yes Independently performs ADLs?: No Communication: Needs assistance Is this a change from baseline?: Pre-admission baseline Dressing (OT): Needs assistance Is this a change from baseline?: Pre-admission baseline Grooming: Needs assistance Is this a change from baseline?: Pre-admission baseline Feeding: Needs assistance Is this a change from baseline?: Pre-admission baseline Bathing: Needs assistance Is this a change from baseline?: Pre-admission baseline Toileting: Needs assistance Is this a change from baseline?: Pre-admission baseline In/Out Bed: Needs assistance Is this a change from baseline?: Pre-admission baseline Walks in Home: Needs assistance Is this a change from baseline?: Pre-admission baseline Does the patient have difficulty walking or climbing stairs?: Yes Weakness of Legs: Both Weakness of Arms/Hands: None  Permission Sought/Granted                  Emotional Assessment  Affect (typically observed): Appropriate Orientation: : Oriented to Self, Oriented to Place, Oriented to Situation, Oriented to  Time Alcohol / Substance Use: Not Applicable Psych Involvement: No (comment)  Admission diagnosis:  Hypokalemia  [E87.6] Hyponatremia [E87.1] Patient Active Problem List   Diagnosis Date Noted   Hyponatremia 08/29/2023   ACS (acute coronary syndrome) (HCC) 07/01/2023   Foley catheter problem (HCC) 02/02/2022   AKI (acute kidney injury) (HCC) 07/31/2021   Multiple duodenal ulcers 07/31/2021   Neurocognitive deficits 07/31/2021   Hematemesis 07/19/2021   Hyperkalemia 07/19/2021   Leukocytosis 07/19/2021   SIRS (systemic inflammatory response syndrome) (HCC) 07/19/2021   Acute blood loss anemia 07/19/2021   Transient hypotension 07/19/2021   GIB (gastrointestinal bleeding) 07/19/2021   Antiplatelet or antithrombotic long-term use    CKD (chronic kidney disease), stage III (HCC) 07/17/2021   Macrocytic anemia 07/17/2021   Atrial fibrillation (HCC) 07/17/2021   Pressure ulcers of skin of multiple topographic sites 07/17/2021   Rhabdomyolysis 07/11/2021   Fall at home, initial encounter 07/11/2021   Acute lower UTI 07/11/2021   Urinary retention 03/27/2020   Exudative age-related macular degeneration, right eye, with active choroidal neovascularization (HCC) 08/23/2017   BMI 32.0-32.9,adult 02/26/2016   Neurogenic bladder 02/26/2016   Scalp hematoma 01/13/2016   Self-catheterizes urinary bladder 01/13/2016   Pneumothorax, left 01/12/2016   Atypical chest pain 12/02/2015   Muscular deconditioning 02/20/2015   History of penile cancer 01/09/2015   Cellulitis 05/17/2014   Pain in limb- Slight pain- Left Leg 05/03/2014   Discoloration of skin-Left Leg 05/03/2014   Stasis edema with ulcer (HCC) 08/17/2013   Peripheral vascular disease, unspecified (HCC) 07/13/2013   Swelling of limb 07/13/2013   Coronary artery disease 06/07/2013   Left upper extremity numbness 06/07/2013   Hyperlipidemia    Knee pain 08/11/2011   PCP:  [name], MD Pharmacy:   [facility] [phone] - [place], [place] - [address] AT [facility] [address] & [address] [place]  [address] Phone: [phone] Fax: [phone]  [facility] - [place], [place] - [address] [place] [address] Phone: [phone] Fax: [phone]  [id number] [place], [place] - [address] AT [facility] OF [facility] [address] & [facility] [address] [place] [address] Phone: [phone] Fax: [phone]  [facility] - [place], [place] - [address] [place] [address] Phone: [phone] Fax: [phone]     Social Determinants of Health (SDOH) Social History: SDOH Screenings   Food Insecurity: No Food Insecurity (08/29/2023)  Housing: Low Risk  (08/29/2023)  Transportation Needs: No Transportation Needs (08/29/2023)  Utilities: Not At Risk (08/29/2023)  Tobacco Use: Medium Risk (08/29/2023)   SDOH Interventions:     Readmission Risk Interventions    07/15/2021    1:30 PM 07/14/2021   10:40 AM  Readmission Risk Prevention Plan  Post Dischage Appt Complete   Medication Screening  Complete  Transportation Screening  Complete

## 2023-08-30 NOTE — Evaluation (Signed)
Physical Therapy Evaluation Patient Details Name: Caleb [name] MRN: [id number] DOB: 20-Jul-1925 Today's Date: 08/30/2023  History of Present Illness  Caleb [name] is a 89 y.o. male with a history of CAD s/p CABG, PVD, HLD, obesity, and penile CA with chronic indwelling foley who presented to the ED on the advice of his caregiver for fatigue. The patient is not completely clear on why he came to the ED. He denies fever, chills, night sweats, HA, vision changes, neck pain/stiffness that is new, chest pain, dyspnea, orthopnea, PND, abd pain, N/V/D. Has good appetite and drinks water "right much." He doesn't eat as much as he'd like because his caregiver can't cook well (he says).  There have been multiple reported falls, the patient isn't sure and says he gets around in a wheelchair. Per EMR, during a catheter exchange in the urology office on 8/21 he reported dysuria, was found to have UTI and had macrobid called in for this (to which the urine culture Enterobacter is sensitive). He can't tell me what medications he takes but his urine here showed rare bacteria and no WBCs. On arrival he's afebrile with normal vital signs. His metabolic profile identified severe hyponatremia (Na 122) and hypokalemia (K 2.7) with elevated Cr (1.7) which is at/better than his baseline.  Clinical Impression   Pt is a 89yo male presenting to [facility] due to reason stated in the HPI.  Pt was limited today's Physical Therapy Evaluation, due to fatigue and safety concerns due to increased assistance needed throughout evaluation. + 2 for safety, pt below baseline, needing increased assistance for bed mobility, hygiene and transfers with AD. Below baseline due to muscle weakness, deconditioning and medical issues stated above.. Based upon these deficits/impairments, pt would benefit from skilled acute physical therapy services to address the above deficits and improve their functional status. PT recommends  pt discharge to short term skilled rehab facility in order to improve pt's functional status, safety and independence with functional mobility and overall QOL.             If plan is discharge home, recommend the following: A lot of help with walking and/or transfers;A lot of help with bathing/dressing/bathroom   Can travel by private vehicle    (unaddressed)    Equipment Recommendations None recommended by PT  Recommendations for Other Services       Functional Status Assessment Patient has had a recent decline in their functional status and demonstrates the ability to make significant improvements in function in a reasonable and predictable amount of time.     Precautions / Restrictions Precautions Precautions: Fall Restrictions Weight Bearing Restrictions: No      Mobility  Bed Mobility Overal bed mobility: Needs Assistance Bed Mobility: Rolling, Supine to Sit, Sit to Supine Rolling: Mod assist   Supine to sit: Mod assist, Max assist Sit to supine: Mod assist, Max assist   General bed mobility comments: labored effort; much assist. Provided hand over hand cues for bedrail use and extremity placement during perihygiene while OT/PT performing assist at bed level. Pt incontinent of stool. Patient Response: Cooperative  Transfers Overall transfer level: Needs assistance Equipment used: Rolling Dern (2 wheels) Transfers: Sit to/from Stand Sit to Stand: Max assist           General transfer comment: x2 reps of attempted sit to stand. Only able to stand on the 2nd attempt. Very unsteady in standing. Cues for sliding anterior with max assist x 1 first attempt  and SBA during second anterior shift for STS placement. Max assist x 2. Limited OOB due to safety concerns with +2, unsteadiness during transfer.    Ambulation/Gait               General Gait Details: Not performed this session. +2. Increased unsteadiness and pt reporting "I'm going to  fall"  Stairs            Wheelchair Mobility     Tilt Bed Tilt Bed Patient Response: Cooperative  Modified Rankin (Stroke Patients Only)       Balance Overall balance assessment: Needs assistance Sitting-balance support: No upper extremity supported, Feet supported Sitting balance-Leahy Scale: Fair Sitting balance - Comments: seated at EOB, BUE  support at hips and with bedrail. Postural control: Posterior lean Standing balance support: Bilateral upper extremity supported, During functional activity, Reliant on assistive device for balance Standing balance-Leahy Scale: Poor Standing balance comment: using RW; bilateral trembling knees in standing x 2 for safety.                             Pertinent Vitals/Pain Pain Assessment Pain Assessment: No/denies pain    Home Living Family/patient expects to be discharged to:: Private residence Living Arrangements: Non-relatives/Friends Available Help at Discharge: Available 24 hours/day;Personal care attendant Type of Home: House Home Access: Stairs to enter Entrance Stairs-Rails: Right;Left;Can reach both Entrance Stairs-Number of Steps: 2   Home Layout: One level Home Equipment: Rollator (4 wheels);Cane - single point;Grab bars - tub/shower Additional Comments: Pt reports living in the same place as prior history.    Prior Function Prior Level of Function : Needs assist       Physical Assist : Mobility (physical);ADLs (physical) Mobility (physical): Transfers;Gait ADLs (physical): Bathing;Dressing;Toileting;IADLs Mobility Comments: used rollator at all times. pt reports sometimes being able to independently stand and ambulate to bathroom. ADLs Comments: Assist for all ADL's except grooming and feeding.     Extremity/Trunk Assessment   Upper Extremity Assessment Upper Extremity Assessment: Defer to OT evaluation    Lower Extremity Assessment Lower Extremity Assessment: Generalized weakness;RLE  deficits/detail;LLE deficits/detail RLE Sensation: WNL LLE Sensation: WNL    Cervical / Trunk Assessment Cervical / Trunk Assessment: Kyphotic  Communication   Communication Communication: No apparent difficulties Cueing Techniques: Verbal cues;Tactile cues  Cognition Arousal: Alert Behavior During Therapy: WFL for tasks assessed/performed Overall Cognitive Status: Within Functional Limits for tasks assessed                                          General Comments      Exercises     Assessment/Plan    PT Assessment Patient needs continued PT services  PT Problem List Decreased strength;Decreased range of motion;Decreased balance;Decreased activity tolerance;Decreased mobility       PT Treatment Interventions DME instruction;Gait training;Functional mobility training;Therapeutic activities;Therapeutic exercise;Balance training;Neuromuscular re-education    PT Goals (Current goals can be found in the Care Plan section)  Acute Rehab PT Goals Patient Stated Goal: No goal stated at this time. Time For Goal Achievement: 09/13/23 Potential to Achieve Goals: Good    Frequency Min 3X/week     Co-evaluation PT/OT/SLP Co-Evaluation/Treatment: Yes Reason for Co-Treatment: To address functional/ADL transfers PT goals addressed during session: Mobility/safety with mobility OT goals addressed during session: ADL's and self-care       AM-PAC PT "  6 Clicks" Mobility  Outcome Measure Help needed turning from your back to your side while in a flat bed without using bedrails?: A Lot Help needed moving from lying on your back to sitting on the side of a flat bed without using bedrails?: A Lot Help needed moving to and from a bed to a chair (including a wheelchair)?: A Lot Help needed standing up from a chair using your arms (e.g., wheelchair or bedside chair)?: Total Help needed to walk in hospital room?: Total Help needed climbing 3-5 steps with a railing? :  Total 6 Click Score: 9    End of Session Equipment Utilized During Treatment: Gait belt Activity Tolerance: Patient limited by fatigue Patient left: in bed;with call bell/phone within reach;with bed alarm set;with nursing/sitter in room Nurse Communication: Need for lift equipment PT Visit Diagnosis: Unsteadiness on feet (R26.81);Other abnormalities of gait and mobility (R26.89);Muscle weakness (generalized) (M62.81)    Time: 0981-1914 PT Time Calculation (min) (ACUTE ONLY): 24 min   Charges:   PT Evaluation $PT Eval Low Complexity: 1 Low PT Treatments $Therapeutic Activity: 8-22 mins PT General Charges $$ ACUTE PT VISIT: 1 Visit         Nelida Meuse PT, DPT Physical Therapist with [facility] [phone] office  Nelida Meuse 08/30/2023, 10:55 AM

## 2023-08-30 NOTE — Progress Notes (Signed)
[facility] PROGRESS NOTE  Caleb [name] (DOB: 06/25/1925) ZOX:[id number] PCP: [name], MD Outpatient Specialists: Cardiology, Dr. Algie Coffer  Brief Narrative: Caleb [name] is a 89 y.o. male with a history of CAD s/p CABG, PVD, HLD, obesity, and penile CA with chronic indwelling foley who presented to the ED 9/1 on the advice of his caregiver for fatigue. The patient is not completely clear on why he came to the ED. He denies fever, chills, night sweats, HA, vision changes, neck pain/stiffness that is new, chest pain, dyspnea, orthopnea, PND, abd pain, N/V/D. Has good appetite and drinks water "right much." He doesn't eat as much as he'd like because his caregiver can't cook well (he says).  There have been multiple reported falls, the patient isn't sure and says he gets around in a wheelchair. Per EMR, during a catheter exchange in the urology office on 8/21 he reported dysuria, was found to have UTI and had macrobid called in for this (to which the urine culture Enterobacter is sensitive). He can't tell me what medications he takes but his urine here showed rare bacteria and no WBCs. On arrival he's afebrile with normal vital signs. His metabolic profile identified severe hyponatremia (Na 122) and hypokalemia (K 2.7) with elevated Cr (1.7) which is at/better than his baseline.    Admission was requested. 500cc NS given, fluid restriction instituted, and sodium has risen at goal rate. Still quite low.   Subjective: No complaints. He's been eating "everything they give me." Best friend at bedside.   Objective: BP 119/68 (BP Location: Left Arm)   Pulse 65   Temp 97.7 F (36.5 C)   Resp 20   Ht 5\' 10"  (1.778 m)   Wt 113.4 kg   SpO2 98%   BMI 35.87 kg/m   Gen: Elderly male in no acute distress Pulm: Clear, nonlabored  CV: RRR, II/VI holosystolic murmur at base (erroneously omitted on H&P), Stable nontender R > LLE pitting edema with chronic venous stasis changes.  GI: Soft, NT, ND,  +BS Neuro: Alert and oriented. No new focal deficits. Ext: Warm, no deformities. Diffuse UE ecchymoses and focal hematoma on anteromedial left lower leg that is no significantly tender.   Assessment & Plan: Severe hyponatremia: Known to have been low in the past (121 on last admission), so presumed to be chronic. No nodule/mass on CXR. TSH normal at 2.133.  - Urine sodium elevated to 62. Will recheck this morning, messaged RN.  - AM cortisol and serum Osm still pending.  - Continue 1,200cc fluid restriction. Will recheck in PM and perhaps add salt tablets. He is remaining hydrated adequately.  - Recheck BMP serially - Recheck urine sodium, serum osm in AM   Hypokalemia: Continues improving, though still low - Supplement K orally x2, trend serially.    CAD: No anginal complaints.  - Continue home metoprolol. Antiplatelet not on med list, caregiver reported not taking simvastatin any longer. With his bruising, mild anemia, hematoma, and fall risk, we won't start an antiplatelet at this time.    History of paroxysmal AFib: Currently in NSR.  - Not on anticoagulation presumably due to GI bleed 2022.  - Continue metoprolol.    Falls: Negative pelvis XR, neuroimaging. Scattered ecchymoses consistent with senile purpura though sizeable hematoma on left shin suggest trauma of some kind. - SNF recommended, though pt has good assistance at home, declines recommendation and will return home with home health and caregiver/family support once medically stabilized.    Stage  IIIb CKD: Cr 1.8 which is still a bit better than his previous values would suggest was his baseline. - Avoid nephrotoxins, holding spironolactone at this time.    DNR: POA.     Chronic foley catheter due to neurogenic bladder:  - Maintain foley. Exchange per routine with urology clinic (last 8/21).    GERD:  - PPI  Tyrone Nine, MD [facility] [email or web address] 08/30/2023, 11:45 AM

## 2023-08-31 DIAGNOSIS — E871 Hypo-osmolality and hyponatremia: Secondary | ICD-10-CM | POA: Diagnosis not present

## 2023-08-31 LAB — BASIC METABOLIC PANEL
Anion gap: 11 (ref 5–15)
BUN: 44 mg/dL — ABNORMAL HIGH (ref 8–23)
CO2: 30 mmol/L (ref 22–32)
Calcium: 8.7 mg/dL — ABNORMAL LOW (ref 8.9–10.3)
Chloride: 88 mmol/L — ABNORMAL LOW (ref 98–111)
Creatinine, Ser: 1.81 mg/dL — ABNORMAL HIGH (ref 0.61–1.24)
GFR, Estimated: 34 mL/min — ABNORMAL LOW (ref >60)
Glucose, Bld: 104 mg/dL — ABNORMAL HIGH (ref 70–99)
Potassium: 3.9 mmol/L (ref 3.5–5.1)
Sodium: 129 mmol/L — ABNORMAL LOW (ref 135–145)

## 2023-08-31 LAB — SODIUM: Sodium: 129 mmol/L — ABNORMAL LOW (ref 135–145)

## 2023-08-31 NOTE — Progress Notes (Signed)
[facility] PROGRESS NOTE  Caleb [name] (DOB: 12-29-1924) ZHY:[id number] PCP: [name], MD Outpatient Specialists: Cardiology, Dr. Algie Coffer  Brief Narrative: Caleb [name] is a 89 y.o. male with a history of CAD s/p CABG, PVD, HLD, obesity, and penile CA with chronic indwelling foley who presented to the ED 9/1 on the advice of his caregiver for fatigue. The patient is not completely clear on why he came to the ED. He denies fever, chills, night sweats, HA, vision changes, neck pain/stiffness that is new, chest pain, dyspnea, orthopnea, PND, abd pain, N/V/D. Has good appetite and drinks water "right much." He doesn't eat as much as he'd like because his caregiver can't cook well (he says).  There have been multiple reported falls, the patient isn't sure and says he gets around in a wheelchair. Per EMR, during a catheter exchange in the urology office on 8/21 he reported dysuria, was found to have UTI and had macrobid called in for this (to which the urine culture Enterobacter is sensitive). He can't tell me what medications he takes but his urine here showed rare bacteria and no WBCs. On arrival he's afebrile with normal vital signs. His metabolic profile identified severe hyponatremia (Na 122) and hypokalemia (K 2.7) with elevated Cr (1.7) which is at/better than his baseline.    Admission was requested. 500cc NS given, fluid restriction instituted, and sodium has risen at goal rate. Still < 130, added salt tabs. Continuing management with TOC consulted for SNF arrangement.   Subjective: Brother and pastor at bedside, pt reports he's feeling better. Family reluctant to DC home, pt agrees to SNF. No HA, confusion, dizziness reported.   Objective: BP 107/60 (BP Location: Left Arm)   Pulse 69   Temp 98.1 F (36.7 C) (Oral)   Resp 20   Ht 5\' 10"  (1.778 m)   Wt 113.4 kg   SpO2 96%   BMI 35.87 kg/m   Gen: No distress, elderly and frail Pulm: Clear, nonlabored  CV: RRR, II/VI  holosystolic murmur at base. R > LLE pitting edema is stable GI: Soft, NT, ND, +BS Neuro: Alert and oriented. No new focal deficits. Ext: Warm, no deformities. Skin: Left shin hematoma still intact. No new rashes, lesions or ulcers on visualized skin   Assessment & Plan: Severe hyponatremia: Known to have been low in the past (121 on last admission), so presumed to be chronic. No nodule/mass on CXR. TSH normal at 2.133, AM cortisol wnl at 13.2. Remains isoosmolar with serum osm 270's. Urine sodium elevated to 62 initially, down to 30 so still inappropriately elevated, consistent with SIADH.  - Continue 1,200cc fluid restriction.  - Will recheck in PM on continued salt tablets to confirm not rising too quickly. Na 129 this AM.    Hypokalemia: Continues improving with supplementation. Likely to be well balanced when on spironolactone.   CAD: No anginal complaints.  - Continue home metoprolol. Antiplatelet not on med list, caregiver reported not taking simvastatin any longer. With his bruising, mild anemia, hematoma, and fall risk, we won't start an antiplatelet at this time.    History of paroxysmal AFib: Currently in NSR.  - Not on anticoagulation presumably due to GI bleed 2022.  - Continue metoprolol.    Falls: Negative pelvis XR, neuroimaging. Scattered ecchymoses consistent with senile purpura though sizeable hematoma on left shin suggest trauma of some kind. - SNF recommended, pt amenable. This would really be the safest solution for him. He is now amenable, TOC consulted. Pt  may be stable for DC 9/4.    Stage IIIb CKD: Cr 1.8 which is still a bit better than his previous values would suggest was his baseline. - Avoid nephrotoxins, holding spironolactone at this time.    DNR: POA.     Chronic foley catheter due to neurogenic bladder:  - Maintain foley. Exchange per routine with urology clinic (last 8/21).    GERD:  - PPI  Tyrone Nine, MD [facility] [email or web address] 08/31/2023, 11:01 AM

## 2023-08-31 NOTE — NC FL2 (Signed)
[place] MEDICAID FL2 LEVEL OF CARE FORM     IDENTIFICATION  Patient Name: Caleb [name] Birthdate: 1925-01-09 Sex: male Admission Date (Current Location): 08/29/2023  [facility] and [place] Number:  [employer] and Address:  [facility],  [address], [place] [address]      Provider Number: [id number]  Attending Physician Name and Address:  Tyrone Nine, MD  Relative Name and Phone Number:       Current Level of Care: Hospital Recommended Level of Care: Skilled Nursing Facility Prior Approval Number:    Date Approved/Denied:   PASRR Number: [id number]  Discharge Plan: SNF    Current Diagnoses: Patient Active Problem List   Diagnosis Date Noted   Hyponatremia 08/29/2023   ACS (acute coronary syndrome) (HCC) 07/01/2023   Foley catheter problem (HCC) 02/02/2022   AKI (acute kidney injury) (HCC) 07/31/2021   Multiple duodenal ulcers 07/31/2021   Neurocognitive deficits 07/31/2021   Hematemesis 07/19/2021   Hyperkalemia 07/19/2021   Leukocytosis 07/19/2021   SIRS (systemic inflammatory response syndrome) (HCC) 07/19/2021   Acute blood loss anemia 07/19/2021   Transient hypotension 07/19/2021   GIB (gastrointestinal bleeding) 07/19/2021   Antiplatelet or antithrombotic long-term use    CKD (chronic kidney disease), stage III (HCC) 07/17/2021   Macrocytic anemia 07/17/2021   Atrial fibrillation (HCC) 07/17/2021   Pressure ulcers of skin of multiple topographic sites 07/17/2021   Rhabdomyolysis 07/11/2021   Fall at home, initial encounter 07/11/2021   Acute lower UTI 07/11/2021   Urinary retention 03/27/2020   Exudative age-related macular degeneration, right eye, with active choroidal neovascularization (HCC) 08/23/2017   BMI 32.0-32.9,adult 02/26/2016   Neurogenic bladder 02/26/2016   Scalp hematoma 01/13/2016   Self-catheterizes urinary bladder 01/13/2016   Pneumothorax, left 01/12/2016   Atypical chest pain 12/02/2015   Muscular  deconditioning 02/20/2015   History of penile cancer 01/09/2015   Cellulitis 05/17/2014   Pain in limb- Slight pain- Left Leg 05/03/2014   Discoloration of skin-Left Leg 05/03/2014   Stasis edema with ulcer (HCC) 08/17/2013   Peripheral vascular disease, unspecified (HCC) 07/13/2013   Swelling of limb 07/13/2013   Coronary artery disease 06/07/2013   Left upper extremity numbness 06/07/2013   Hyperlipidemia    Knee pain 08/11/2011    Orientation RESPIRATION BLADDER Height & Weight     Self, Time, Situation, Place  Normal Indwelling catheter Weight: 250 lb (113.4 kg) Height:  5\' 10"  (177.8 cm)  BEHAVIORAL SYMPTOMS/MOOD NEUROLOGICAL BOWEL NUTRITION STATUS      Incontinent Diet (Regular. See d/c summary for updates.)  AMBULATORY STATUS COMMUNICATION OF NEEDS Skin   Extensive Assist Verbally Skin abrasions, Bruising                       Personal Care Assistance Level of Assistance  Bathing, Feeding, Dressing Bathing Assistance: Maximum assistance Feeding assistance: Limited assistance Dressing Assistance: Maximum assistance     Functional Limitations Info  Sight, Hearing, Speech Sight Info: Adequate Hearing Info: Impaired Speech Info: Adequate    SPECIAL CARE FACTORS FREQUENCY  PT (By licensed PT)     PT Frequency: 5x weekly              Contractures      Additional Factors Info  Code Status, Allergies Code Status Info: DNR- pre-arrest interventions desired Allergies Info: No known allergies           Current Medications (08/31/2023):  This is the current hospital active medication list  Current Facility-Administered Medications  Medication Dose Route Frequency Provider Last Rate Last Admin   acetaminophen (TYLENOL) tablet 650 mg  650 mg Oral Q6H PRN Tyrone Nine, MD       Or   acetaminophen (TYLENOL) suppository 650 mg  650 mg Rectal Q6H PRN Tyrone Nine, MD       Chlorhexidine Gluconate Cloth 2 % PADS 6 each  6 each Topical Daily Tyrone Nine, MD    6 each at 08/31/23 2130   metoprolol tartrate (LOPRESSOR) tablet 12.5 mg  12.5 mg Oral BID Tyrone Nine, MD   12.5 mg at 08/31/23 0820   multivitamin with minerals tablet 1 tablet  1 tablet Oral Daily Tyrone Nine, MD   1 tablet at 08/31/23 0820   Oral care mouth rinse  15 mL Mouth Rinse PRN Tyrone Nine, MD       pantoprazole (PROTONIX) EC tablet 40 mg  40 mg Oral Daily Hazeline Junker B, MD   40 mg at 08/31/23 0820   sodium chloride flush (NS) 0.9 % injection 3 mL  3 mL Intravenous Q12H Hazeline Junker B, MD   3 mL at 08/31/23 8657   sodium chloride tablet 1 g  1 g Oral TID WC Tyrone Nine, MD   1 g at 08/31/23 0820     Discharge Medications: Please see discharge summary for a list of discharge medications.  Relevant Imaging Results:  Relevant Lab Results:   Additional Information SSN: [id number]  Karn Cassis, LCSW

## 2023-08-31 NOTE — Progress Notes (Signed)
Physical Therapy Treatment Patient Details Name: Caleb [name] MRN: [id number] DOB: 1925-08-03 Today's Date: 08/31/2023   History of Present Illness Caleb [name] is a 89 y.o. male with a history of CAD s/p CABG, PVD, HLD, obesity, and penile CA with chronic indwelling foley who presented to the ED on the advice of his caregiver for fatigue. The patient is not completely clear on why he came to the ED. He denies fever, chills, night sweats, HA, vision changes, neck pain/stiffness that is new, chest pain, dyspnea, orthopnea, PND, abd pain, N/V/D. Has good appetite and drinks water "right much." He doesn't eat as much as he'd like because his caregiver can't cook well (he says).  There have been multiple reported falls, the patient isn't sure and says he gets around in a wheelchair. Per EMR, during a catheter exchange in the urology office on 8/21 he reported dysuria, was found to have UTI and had macrobid called in for this (to which the urine culture Enterobacter is sensitive). He can't tell me what medications he takes but his urine here showed rare bacteria and no WBCs. On arrival he's afebrile with normal vital signs. His metabolic profile identified severe hyponatremia (Na 122) and hypokalemia (K 2.7) with elevated Cr (1.7) which is at/better than his baseline.    PT Comments  Patient sitting in chair upon therapist arrival and agreeable to participating in therapy session today. Mobility tech was just completing seated therapeutic exercises. Nursing reported patient transferred bed to chair with x3 assist. Patient attempted x3 reps of sit to stand with rest breaks between bouts due to fatigue. Completed sit to stand with assist x2 with mod to max assist and cues for step sequencing and hand placement with RW use. Unsteady initially in standing on each reps with knees buckling. Supervision and cuing for anterior and posterior transfers in chair. Limited further mobility due to safety concerns with sit  to stand, unsteadiness during transfer. Able to maintain standing position with assistance for 30 second bouts x2 before requesting to sit down due to fatigue.   Patient would continue to benefit from skilled physical therapy in current environment and next venue to continue return to prior function and increase strength, endurance, balance, coordination, and functional mobility and gait skills.     If plan is discharge home, recommend the following: A lot of help with walking and/or transfers;A lot of help with bathing/dressing/bathroom   Can travel by private vehicle      (unaddressed)  Equipment Recommendations  None recommended by PT    Recommendations for Other Services       Precautions / Restrictions Precautions Precautions: Fall Restrictions Weight Bearing Restrictions: No     Mobility  Bed Mobility     General bed mobility comments: Patient in chair at beginning and end of session.    Transfers Overall transfer level: Needs assistance Equipment used: Rolling Davison (2 wheels) Transfers: Sit to/from Stand Sit to Stand: Max assist, Mod assist       Anterior-Posterior transfers: Supervision   General transfer comment: x3 reps of attempted sit to stand. Completed sit to stand with assist x2. Unsteady initially in standing. Supervision and cuing for anterior and posterior transfers in chair. Limited further mobility due to safety concerns with sit to stand, unsteadiness during transfer. Able to maintain standing position with assistance for 30 second bouts x2 before requesting to sit down due to fatigue.    Ambulation/Gait     General Gait Details: Not performed this  session.   Stairs       Wheelchair Mobility     Tilt Bed    Modified Rankin (Stroke Patients Only)       Balance Overall balance assessment: Needs assistance Sitting-balance support: Feet supported, Bilateral upper extremity supported Sitting balance-Leahy Scale: Fair Sitting balance  - Comments: seated in chair Postural control: Posterior lean Standing balance support: Bilateral upper extremity supported, During functional activity, Reliant on assistive device for balance Standing balance-Leahy Scale: Poor Standing balance comment: using RW; bilateral trembling knees in standing knees blocked for safety.        Cognition Arousal: Alert Behavior During Therapy: WFL for tasks assessed/performed Overall Cognitive Status: Within Functional Limits for tasks assessed            Exercises      General Comments        Pertinent Vitals/Pain Pain Assessment Pain Assessment: No/denies pain    Home Living                  PT Goals (current goals can now be found in the care plan section) Acute Rehab PT Goals Patient Stated Goal: No goal stated at this time. Time For Goal Achievement: 09/13/23 Potential to Achieve Goals: Good Progress towards PT goals: Progressing toward goals    Frequency    Min 3X/week      PT Plan         AM-PAC PT "6 Clicks" Mobility   Outcome Measure  Help needed turning from your back to your side while in a flat bed without using bedrails?: A Lot Help needed moving from lying on your back to sitting on the side of a flat bed without using bedrails?: A Lot Help needed moving to and from a bed to a chair (including a wheelchair)?: A Lot Help needed standing up from a chair using your arms (e.g., wheelchair or bedside chair)?: Total Help needed to walk in hospital room?: Total Help needed climbing 3-5 steps with a railing? : Total 6 Click Score: 9    End of Session Equipment Utilized During Treatment: Gait belt Activity Tolerance: Patient limited by fatigue;Patient tolerated treatment well Patient left: with call bell/phone within reach;with nursing/sitter in room;in chair;with chair alarm set Nurse Communication: Mobility status;Precautions PT Visit Diagnosis: Unsteadiness on feet (R26.81);Other abnormalities of gait  and mobility (R26.89);Muscle weakness (generalized) (M62.81)     Time: 4098-1191 PT Time Calculation (min) (ACUTE ONLY): 23 min  Charges:    $Therapeutic Activity: 8-22 mins PT General Charges $$ ACUTE PT VISIT: 1 Visit                     Katina Dung. Hartnett-Rands, MS, PT [facility] PT [facility] [place] [phone]  Britta Mccreedy  Hartnett-Rands 08/31/2023, 2:22 PM

## 2023-08-31 NOTE — Progress Notes (Signed)
Mobility Specialist Progress Note:    08/31/23 1340  Mobility  Activity  (BL leg kicks and leg lifts)  Level of Assistance Independent  Assistive Device None  Range of Motion/Exercises Active;Right leg;Left leg  Activity Response Tolerated well  Mobility Referral Yes  $Mobility charge 1 Mobility  Mobility Specialist Start Time (ACUTE ONLY) 1340  Mobility Specialist Stop Time (ACUTE ONLY) 1350  Mobility Specialist Time Calculation (min) (ACUTE ONLY) 10 min   Pt received in chair, agreeable to mobility. Independently did BL leg kicks and leg lifts sitting edge of chair. Deferred ambulation d/t pt stating he "does not walk and uses a wheelchair". Tolerated well, asx throughout. Left pt in chair, PT in room. All needs met.   Caleb [name] Mobility Specialist Please contact via Special educational needs teacher or  Rehab office at [phone]

## 2023-08-31 NOTE — Plan of Care (Signed)
  Problem: Education: Goal: Knowledge of General Education information will improve Description: Including pain rating scale, medication(s)/side effects and non-pharmacologic comfort measures Outcome: Progressing   Problem: Health Behavior/Discharge Planning: Goal: Ability to manage health-related needs will improve Outcome: Not Met (add Reason)   Problem: Clinical Measurements: Goal: Ability to maintain clinical measurements within normal limits will improve Outcome: Progressing   Problem: Activity: Goal: Risk for activity intolerance will decrease Outcome: Not Met (add Reason)   Problem: Nutrition: Goal: Adequate nutrition will be maintained Outcome: Progressing

## 2023-08-31 NOTE — TOC Progression Note (Signed)
Transition of Care [facility]) - Progression Note    Patient Details  Name: Caleb [name] MRN: [id number] Date of Birth: 09/04/1925  Transition of Care [facility]) CM/SW Contact  Karn Cassis, [place] Phone Number: 08/31/2023, 10:31 AM  Clinical Narrative: Pt now agreeable to SNF. LCSW discussed with pt and son who request [facility], [facility], or [facility]. Referral sent. Morrie Sheldon with [facility] updated on plan.       Expected Discharge Plan: Home w Home Health Services Barriers to Discharge: Continued Medical Work up  Expected Discharge Plan and Services In-house Referral: Clinical Social Work     Living arrangements for the past 2 months: Single Family Home                           HH Arranged: PT, RN, Nurse's Aide HH Agency: [facility] Date HH Agency Contacted: 08/30/23 Time HH Agency Contacted: 1137 Representative spoke with at [facility] Agency: Morrie Sheldon   Social Determinants of Health (SDOH) Interventions SDOH Screenings   Food Insecurity: No Food Insecurity (08/29/2023)  Housing: Low Risk  (08/29/2023)  Transportation Needs: No Transportation Needs (08/29/2023)  Utilities: Not At Risk (08/29/2023)  Tobacco Use: Medium Risk (08/29/2023)    Readmission Risk Interventions    07/15/2021    1:30 PM 07/14/2021   10:40 AM  Readmission Risk Prevention Plan  Post Dischage Appt Complete   Medication Screening  Complete  Transportation Screening  Complete

## 2023-08-31 NOTE — TOC Progression Note (Signed)
Transition of Care [facility]) - Progression Note    Patient Details  Name: Caleb [name] MRN: [id number] Date of Birth: 03-Mar-1925  Transition of Care [facility]) CM/SW Contact  Karn Cassis, [place] Phone Number: 08/31/2023, 12:48 PM  Clinical Narrative:  Pt's son accepts bed at [facility]. Son aware pt has used 32 SNF days. Tami at [facility] aware of anticipate d/c tomorrow.      Expected Discharge Plan: Home w Home Health Services Barriers to Discharge: Continued Medical Work up  Expected Discharge Plan and Services In-house Referral: Clinical Social Work     Living arrangements for the past 2 months: Single Family Home                           HH Arranged: PT, RN, Nurse's Aide HH Agency: [facility] Date HH Agency Contacted: 08/30/23 Time HH Agency Contacted: 1137 Representative spoke with at [facility] Agency: Morrie Sheldon   Social Determinants of Health (SDOH) Interventions SDOH Screenings   Food Insecurity: No Food Insecurity (08/29/2023)  Housing: Low Risk  (08/29/2023)  Transportation Needs: No Transportation Needs (08/29/2023)  Utilities: Not At Risk (08/29/2023)  Tobacco Use: Medium Risk (08/29/2023)    Readmission Risk Interventions    07/15/2021    1:30 PM 07/14/2021   10:40 AM  Readmission Risk Prevention Plan  Post Dischage Appt Complete   Medication Screening  Complete  Transportation Screening  Complete

## 2023-08-31 NOTE — Plan of Care (Signed)
  Problem: Health Behavior/Discharge Planning: Goal: Ability to manage health-related needs will improve Outcome: Progressing   Problem: Clinical Measurements: Goal: Will remain free from infection Outcome: Progressing   Problem: Clinical Measurements: Goal: Diagnostic test results will improve Outcome: Progressing   Problem: Activity: Goal: Risk for activity intolerance will decrease Outcome: Progressing   Problem: Nutrition: Goal: Adequate nutrition will be maintained Outcome: Progressing

## 2023-09-01 DIAGNOSIS — R339 Retention of urine, unspecified: Secondary | ICD-10-CM | POA: Diagnosis not present

## 2023-09-01 DIAGNOSIS — R262 Difficulty in walking, not elsewhere classified: Secondary | ICD-10-CM | POA: Diagnosis not present

## 2023-09-01 DIAGNOSIS — I700 Atherosclerosis of aorta: Secondary | ICD-10-CM | POA: Diagnosis not present

## 2023-09-01 DIAGNOSIS — K219 Gastro-esophageal reflux disease without esophagitis: Secondary | ICD-10-CM | POA: Diagnosis not present

## 2023-09-01 DIAGNOSIS — Z936 Other artificial openings of urinary tract status: Secondary | ICD-10-CM | POA: Diagnosis not present

## 2023-09-01 DIAGNOSIS — R488 Other symbolic dysfunctions: Secondary | ICD-10-CM | POA: Diagnosis not present

## 2023-09-01 DIAGNOSIS — K5909 Other constipation: Secondary | ICD-10-CM | POA: Diagnosis not present

## 2023-09-01 DIAGNOSIS — N179 Acute kidney failure, unspecified: Secondary | ICD-10-CM | POA: Diagnosis not present

## 2023-09-01 DIAGNOSIS — R29818 Other symptoms and signs involving the nervous system: Secondary | ICD-10-CM | POA: Diagnosis not present

## 2023-09-01 DIAGNOSIS — N390 Urinary tract infection, site not specified: Secondary | ICD-10-CM | POA: Diagnosis not present

## 2023-09-01 DIAGNOSIS — I480 Paroxysmal atrial fibrillation: Secondary | ICD-10-CM | POA: Diagnosis not present

## 2023-09-01 DIAGNOSIS — F0150 Vascular dementia without behavioral disturbance: Secondary | ICD-10-CM | POA: Diagnosis not present

## 2023-09-01 DIAGNOSIS — Z8549 Personal history of malignant neoplasm of other male genital organs: Secondary | ICD-10-CM | POA: Diagnosis not present

## 2023-09-01 DIAGNOSIS — N1832 Chronic kidney disease, stage 3b: Secondary | ICD-10-CM | POA: Diagnosis not present

## 2023-09-01 DIAGNOSIS — R29898 Other symptoms and signs involving the musculoskeletal system: Secondary | ICD-10-CM | POA: Diagnosis not present

## 2023-09-01 DIAGNOSIS — H353211 Exudative age-related macular degeneration, right eye, with active choroidal neovascularization: Secondary | ICD-10-CM | POA: Diagnosis not present

## 2023-09-01 DIAGNOSIS — M6281 Muscle weakness (generalized): Secondary | ICD-10-CM | POA: Diagnosis not present

## 2023-09-01 DIAGNOSIS — R4189 Other symptoms and signs involving cognitive functions and awareness: Secondary | ICD-10-CM | POA: Diagnosis not present

## 2023-09-01 DIAGNOSIS — Z9181 History of falling: Secondary | ICD-10-CM | POA: Diagnosis not present

## 2023-09-01 DIAGNOSIS — Z951 Presence of aortocoronary bypass graft: Secondary | ICD-10-CM | POA: Diagnosis not present

## 2023-09-01 DIAGNOSIS — E785 Hyperlipidemia, unspecified: Secondary | ICD-10-CM | POA: Diagnosis not present

## 2023-09-01 DIAGNOSIS — S81802A Unspecified open wound, left lower leg, initial encounter: Secondary | ICD-10-CM | POA: Diagnosis not present

## 2023-09-01 DIAGNOSIS — I872 Venous insufficiency (chronic) (peripheral): Secondary | ICD-10-CM | POA: Diagnosis not present

## 2023-09-01 DIAGNOSIS — B952 Enterococcus as the cause of diseases classified elsewhere: Secondary | ICD-10-CM | POA: Diagnosis not present

## 2023-09-01 DIAGNOSIS — I2510 Atherosclerotic heart disease of native coronary artery without angina pectoris: Secondary | ICD-10-CM | POA: Diagnosis not present

## 2023-09-01 DIAGNOSIS — E871 Hypo-osmolality and hyponatremia: Secondary | ICD-10-CM | POA: Diagnosis not present

## 2023-09-01 DIAGNOSIS — I739 Peripheral vascular disease, unspecified: Secondary | ICD-10-CM | POA: Diagnosis not present

## 2023-09-01 DIAGNOSIS — E222 Syndrome of inappropriate secretion of antidiuretic hormone: Secondary | ICD-10-CM | POA: Diagnosis not present

## 2023-09-01 DIAGNOSIS — N319 Neuromuscular dysfunction of bladder, unspecified: Secondary | ICD-10-CM | POA: Diagnosis not present

## 2023-09-01 LAB — BASIC METABOLIC PANEL
Anion gap: 12 (ref 5–15)
BUN: 43 mg/dL — ABNORMAL HIGH (ref 8–23)
CO2: 30 mmol/L (ref 22–32)
Calcium: 8.9 mg/dL (ref 8.9–10.3)
Chloride: 90 mmol/L — ABNORMAL LOW (ref 98–111)
Creatinine, Ser: 1.97 mg/dL — ABNORMAL HIGH (ref 0.61–1.24)
GFR, Estimated: 31 mL/min — ABNORMAL LOW (ref >60)
Glucose, Bld: 120 mg/dL — ABNORMAL HIGH (ref 70–99)
Potassium: 3.9 mmol/L (ref 3.5–5.1)
Sodium: 132 mmol/L — ABNORMAL LOW (ref 135–145)

## 2023-09-01 MED ORDER — NYSTATIN 100000 UNIT/GM EX POWD
Freq: Three times a day (TID) | CUTANEOUS | Status: DC
  Filled 2023-09-01: qty 15

## 2023-09-01 MED ORDER — SODIUM CHLORIDE 1 G PO TABS: 1.0000 g | ORAL_TABLET | Freq: Two times a day (BID) | ORAL | Status: DC

## 2023-09-01 MED ORDER — NYSTATIN 100000 UNIT/GM EX POWD: Freq: Three times a day (TID) | CUTANEOUS | 0 refills | Status: DC

## 2023-09-01 NOTE — TOC Transition Note (Signed)
Transition of Care [facility]) - CM/SW Discharge Note   Patient Details  Name: Caleb [name] MRN: [id number] Date of Birth: 04/29/1925  Transition of Care [facility]) CM/SW Contact:  Karn Cassis, LCSW Phone Number: 09/01/2023, 11:29 AM   Clinical Narrative:  Pt d/c today to [facility]. Pt's son, Annette Stable and facility aware and agreeable. RN given number to call report. D/C summary sent to SNF. Pt will transfer with staff.       Final next level of care: Skilled Nursing Facility Barriers to Discharge: Barriers Resolved   Patient Goals and CMS Choice   Choice offered to / list presented to : Patient, Adult Children  Discharge Placement                Patient chooses bed at: [facility] Patient to be transferred to facility by: staff Name of family member notified: Annette Stable- son Patient and family notified of of transfer: 09/01/23  Discharge Plan and Services Additional resources added to the After Visit Summary for   In-house Referral: Clinical Social Work                        HH Arranged: PT, Charity fundraiser, Nurse's Aide HH Agency: [facility] Date HH Agency Contacted: 08/30/23 Time HH Agency Contacted: 1137 Representative spoke with at [facility] Agency: Morrie Sheldon  Social Determinants of Health (SDOH) Interventions SDOH Screenings   Food Insecurity: No Food Insecurity (08/29/2023)  Housing: Low Risk  (08/29/2023)  Transportation Needs: No Transportation Needs (08/29/2023)  Utilities: Not At Risk (08/29/2023)  Tobacco Use: Medium Risk (08/29/2023)     Readmission Risk Interventions    07/15/2021    1:30 PM 07/14/2021   10:40 AM  Readmission Risk Prevention Plan  Post Dischage Appt Complete   Medication Screening  Complete  Transportation Screening  Complete

## 2023-09-01 NOTE — Care Management Important Message (Signed)
Important Message  Patient Details  Name: Caleb [name] MRN: [id number] Date of Birth: 1925-04-26   Medicare Important Message Given:  Yes     Corey Harold 09/01/2023, 11:27 AM

## 2023-09-01 NOTE — Discharge Summary (Signed)
Physician Discharge Summary   Patient: Caleb [name] MRN: [id number] DOB: 12/21/1925  Admit date:     08/29/2023  Discharge date: 09/01/23  Discharge Physician: Tyrone Nine   PCP: [name], MD   Recommendations at discharge:  Follow up BMP in the next week, continue fluid restriction and sodium chloride 1g BID for SIADH.  Continue rehabilitation efforts at SNF.  Discharge Diagnoses: Principal Problem:   Hyponatremia Active Problems:   Hyperlipidemia   Coronary artery disease   Urinary retention   Muscular deconditioning   Neurogenic bladder   History of penile cancer   CKD (chronic kidney disease), stage III [facility])  Hospital Course: Caleb [name] is a 89 y.o. male with a history of CAD s/p CABG, PVD, HLD, obesity, and penile CA with chronic indwelling foley who presented to the ED 9/1 on the advice of his caregiver for fatigue. The patient is not completely clear on why he came to the ED. He denies fever, chills, night sweats, HA, vision changes, neck pain/stiffness that is new, chest pain, dyspnea, orthopnea, PND, abd pain, N/V/D. Has good appetite and drinks water "right much." He doesn't eat as much as he'd like because his caregiver can't cook well (he says).  There have been multiple reported falls, the patient isn't sure and says he gets around in a wheelchair. Per EMR, during a catheter exchange in the urology office on 8/21 he reported dysuria, was found to have UTI and had macrobid called in for this (to which the urine culture Enterobacter is sensitive). He can't tell me what medications he takes but his urine here showed rare bacteria and no WBCs. On arrival he's afebrile with normal vital signs. His metabolic profile identified severe hyponatremia (Na 122) and hypokalemia (K 2.7) with elevated Cr (1.7) which is at/better than his baseline.    Admission was requested. 500cc NS given, fluid restriction instituted, and sodium has risen at goal rate. Still < 130, added salt  tabs. Continuing management with TOC consulted for SNF arrangement.   Assessment and Plan: Severe hyponatremia: Known to have been low in the past (121 on last admission), so presumed to be chronic. No nodule/mass on CXR. TSH normal at 2.133, AM cortisol wnl at 13.2. Remains isoosmolar with serum osm 270's. Urine sodium elevated to 62 initially, down to 30 so still inappropriately elevated, consistent with SIADH.  - Continue 1,200cc fluid restriction and sodium chloride 1g BID, suggest recheck in next 3 days. Na 132 this AM. Remains roughly euvolemic.   Hypokalemia: Resolved with supplementation. Likely to be well balanced when on spironolactone.   CAD: No anginal complaints.  - Continue home metoprolol. Antiplatelet not on med list, caregiver reported not taking simvastatin any longer. With his bruising, mild anemia, hematoma, and fall risk, we won't start an antiplatelet at this time.    History of paroxysmal AFib: Currently in NSR.  - Not on anticoagulation presumably due to GI bleed 2022.  - Continue metoprolol.    Falls: Negative pelvis XR, neuroimaging. Scattered ecchymoses consistent with senile purpura though sizeable hematoma on left shin suggest trauma of some kind. - SNF recommended, pt amenable.    Stage IIIb CKD: Cr 1.97 which is consistent with/still a bit better than his previous values would suggest was his baseline. - Avoid nephrotoxins   MASD:  - Nystatin powder, keep areas in groin and under pannus dry.   DNR: POA.     Chronic foley catheter due to neurogenic bladder:  - Maintain  foley. Exchange per routine with urology clinic (last 8/21).    GERD:  - PPI  Consultants: None Procedures performed: None  Disposition: SNF Diet recommendation: Regular with 1,200cc fluid restriction DISCHARGE MEDICATION: Allergies as of 09/01/2023   No Known Allergies      Medication List     STOP taking these medications    cephALEXin 500 MG capsule Commonly known as:  KEFLEX   nitrofurantoin (macrocrystal-monohydrate) 100 MG capsule Commonly known as: MACROBID       TAKE these medications    magnesium hydroxide 400 MG/5ML suspension Commonly known as: MILK OF MAGNESIA Take 30 mLs by mouth daily as needed for mild constipation.   metoprolol tartrate 25 MG tablet Commonly known as: LOPRESSOR Take 0.5 tablets (12.5 mg total) by mouth 2 (two) times daily.   multivitamin with minerals Tabs tablet Take 1 tablet by mouth daily. Centrum Silver   nystatin powder Commonly known as: MYCOSTATIN/NYSTOP Apply topically 3 (three) times daily.   polyethylene glycol powder 17 GM/SCOOP powder Commonly known as: GLYCOLAX/MIRALAX Take 17 g by mouth daily. What changed:  when to take this reasons to take this   PreserVision AREDS 2 Caps Take 1 tablet by mouth daily.   sodium chloride 1 g tablet Take 1 tablet (1 g total) by mouth 2 (two) times daily with a meal.   spironolactone 25 MG tablet Commonly known as: ALDACTONE Take 0.5 tablets (12.5 mg total) by mouth daily.   TUMS PO Take 1 tablet by mouth as needed (heartburn).        Contact information for follow-up providers     [facility] Follow up.   Why: Will contact you to schedule home health visits.        [name], MD Follow up.   Specialty: Internal Medicine Contact information: [address] [place] [address] [phone]              Contact information for after-discharge care     Destination     [facility] .   Service: Skilled Nursing Contact information: [address] [place] [address] [phone]                    Discharge Exam: Ceasar Mons Weights   08/29/23 1124  Weight: 113.4 kg  BP 118/68 (BP Location: Left Arm)   Pulse 73   Temp 98.5 F (36.9 C) (Oral)   Resp 20   Ht 5\' 10"  (1.778 m)   Wt 113.4 kg   SpO2 97%   BMI 35.87 kg/m   Gen: No distress, elderly and frail Pulm:  Clear, nonlabored  CV: RRR, II/VI holosystolic murmur at base. R > LLE pitting edema is stable GI: Soft, NT, ND, +BS Neuro: Alert and oriented. No new focal deficits. Ext: Warm, no deformities. Skin: Left shin hematoma still intact. No new rashes, lesions or ulcers on visualized skin   Condition at discharge: stable  The results of significant diagnostics from this hospitalization (including imaging, microbiology, ancillary and laboratory) are listed below for reference.   Imaging Studies: DG Pelvis Portable  Result Date: 08/29/2023 CLINICAL DATA:  Shortness of breath, fall with pain. EXAM: PORTABLE PELVIS 1-2 VIEWS COMPARISON:  Hip radiographs dated 07/11/2021. FINDINGS: There is no evidence of pelvic fracture or diastasis. Degenerative changes are seen in the spine. Mild bilateral hip osteoarthritis is noted. IMPRESSION: No acute osseous injury. Electronically Signed   By: Foye Spurling.D.  On: 08/29/2023 12:30   DG Chest Port 1 View  Result Date: 08/29/2023 CLINICAL DATA:  Shortness of breath. EXAM: PORTABLE CHEST 1 VIEW COMPARISON:  07/01/2023 FINDINGS: Low volume film. The cardio pericardial silhouette is enlarged. The lungs are clear without focal pneumonia, edema, pneumothorax or pleural effusion. Interstitial markings are diffusely coarsened with chronic features. Nonacute left-sided rib fractures again noted. Chronic posttraumatic deformity in the left shoulder is stable. IMPRESSION: No acute cardiopulmonary findings. Electronically Signed   By: Kennith Center M.D.   On: 08/29/2023 12:29   CT Head Wo Contrast  Result Date: 08/29/2023 CLINICAL DATA:  Headache with increasing frequency.  Multiple falls. EXAM: CT HEAD WITHOUT CONTRAST TECHNIQUE: Contiguous axial images were obtained from the base of the skull through the vertex without intravenous contrast. RADIATION DOSE REDUCTION: This exam was performed according to the departmental dose-optimization program which includes automated  exposure control, adjustment of the mA and/or kV according to patient size and/or use of iterative reconstruction technique. COMPARISON:  None Available.  07/11/2021 FINDINGS: Brain: There is no evidence for acute hemorrhage, hydrocephalus, mass lesion, or abnormal extra-axial fluid collection. No definite CT evidence for acute infarction. Diffuse loss of parenchymal volume is consistent with atrophy. Patchy low attenuation in the deep hemispheric and periventricular white matter is nonspecific, but likely reflects chronic microvascular ischemic demyelination. Stable lacunar infarct in the left thalamus. Vascular: No hyperdense vessel or unexpected calcification. Skull: No evidence for fracture. No worrisome lytic or sclerotic lesion. Sinuses/Orbits: The visualized paranasal sinuses and mastoid air cells are clear. Visualized portions of the globes and intraorbital fat are unremarkable. Other: None. IMPRESSION: 1. No acute intracranial abnormality. 2. Atrophy with chronic small vessel white matter ischemic disease. Electronically Signed   By: Kennith Center M.D.   On: 08/29/2023 12:11    Microbiology: Results for orders placed or performed during the hospital encounter of 08/29/23  Resp panel by RT-PCR (RSV, Flu A&B, Covid) Anterior Nasal Swab     Status: None   Collection Time: 08/29/23 12:25 PM   Specimen: Anterior Nasal Swab  Result Value Ref Range Status   SARS Coronavirus 2 by RT PCR NEGATIVE NEGATIVE Final    Comment: (NOTE) SARS-CoV-2 target nucleic acids are NOT DETECTED.  The SARS-CoV-2 RNA is generally detectable in upper respiratory specimens during the acute phase of infection. The lowest concentration of SARS-CoV-2 viral copies this assay can detect is 138 copies/mL. A negative result does not preclude SARS-Cov-2 infection and should not be used as the sole basis for treatment or other patient management decisions. A negative result may occur with  improper specimen collection/handling,  submission of specimen other than nasopharyngeal swab, presence of viral mutation(s) within the areas targeted by this assay, and inadequate number of viral copies(<138 copies/mL). A negative result must be combined with clinical observations, patient history, and epidemiological information. The expected result is Negative.  Fact Sheet for Patients:  BloggerCourse.com  Fact Sheet for Healthcare Providers:  SeriousBroker.it  This test is no t yet approved or cleared by the [place] FDA and  has been authorized for detection and/or diagnosis of SARS-CoV-2 by FDA under an Emergency Use Authorization (EUA). This EUA will remain  in effect (meaning this test can be used) for the duration of the COVID-19 declaration under Section 564(b)(1) of the Act, 21 U.S.C.section 360bbb-3(b)(1), unless the authorization is terminated  or revoked sooner.       Influenza A by PCR NEGATIVE NEGATIVE Final   Influenza B by PCR NEGATIVE  NEGATIVE Final    Comment: (NOTE) The Xpert Xpress SARS-CoV-2/FLU/RSV plus assay is intended as an aid in the diagnosis of influenza from Nasopharyngeal swab specimens and should not be used as a sole basis for treatment. Nasal washings and aspirates are unacceptable for Xpert Xpress SARS-CoV-2/FLU/RSV testing.  Fact Sheet for Patients: BloggerCourse.com  Fact Sheet for Healthcare Providers: SeriousBroker.it  This test is not yet approved or cleared by the [place] FDA and has been authorized for detection and/or diagnosis of SARS-CoV-2 by FDA under an Emergency Use Authorization (EUA). This EUA will remain in effect (meaning this test can be used) for the duration of the COVID-19 declaration under Section 564(b)(1) of the Act, 21 U.S.C. section 360bbb-3(b)(1), unless the authorization is terminated or revoked.     Resp Syncytial Virus by PCR NEGATIVE  NEGATIVE Final    Comment: (NOTE) Fact Sheet for Patients: BloggerCourse.com  Fact Sheet for Healthcare Providers: SeriousBroker.it  This test is not yet approved or cleared by the [place] FDA and has been authorized for detection and/or diagnosis of SARS-CoV-2 by FDA under an Emergency Use Authorization (EUA). This EUA will remain in effect (meaning this test can be used) for the duration of the COVID-19 declaration under Section 564(b)(1) of the Act, 21 U.S.C. section 360bbb-3(b)(1), unless the authorization is terminated or revoked.  Performed at [facility], [address] [place], [place] [address]     Labs: CBC: Recent Labs  Lab 08/29/23 1146  WBC 7.8  NEUTROABS 5.9  HGB 11.0*  HCT 32.0*  MCV 94.1  PLT 184   Basic Metabolic Panel: Recent Labs  Lab 08/29/23 1554 08/29/23 2143 08/30/23 0533 08/30/23 1238 08/31/23 0421 08/31/23 1527 09/01/23 0425  NA 124* 124* 125* 125* 129* 129* 132*  K 2.8* 3.0* 3.3* 3.5 3.9  --  3.9  CL 78* 80* 84* 84* 88*  --  90*  CO2 34* 33* 32 32 30  --  30  GLUCOSE 122* 104* 89 114* 104*  --  120*  BUN 39* 40* 39* 40* 44*  --  43*  CREATININE 1.70* 1.82* 1.84* 1.90* 1.81*  --  1.97*  CALCIUM 8.9 8.7* 9.0 8.8* 8.7*  --  8.9  MG 3.8*  --   --   --   --   --   --    Liver Function Tests: Recent Labs  Lab 08/29/23 1146  AST 28  ALT 20  ALKPHOS 52  BILITOT 1.0  PROT 6.5  ALBUMIN 3.8   CBG: No results for input(s): "GLUCAP" in the last 168 hours.  Discharge time spent: greater than 30 minutes.  Signed: Tyrone Nine, MD [facility] 09/01/2023

## 2023-09-02 ENCOUNTER — Non-Acute Institutional Stay (SKILLED_NURSING_FACILITY): Payer: Self-pay | Admitting: Adult Health

## 2023-09-02 ENCOUNTER — Encounter: Payer: Self-pay | Admitting: Adult Health

## 2023-09-02 ENCOUNTER — Ambulatory Visit: Payer: Medicare Other | Admitting: Urology

## 2023-09-02 DIAGNOSIS — Z9079 Acquired absence of other genital organ(s): Secondary | ICD-10-CM

## 2023-09-02 DIAGNOSIS — N319 Neuromuscular dysfunction of bladder, unspecified: Secondary | ICD-10-CM

## 2023-09-02 DIAGNOSIS — N1832 Chronic kidney disease, stage 3b: Secondary | ICD-10-CM

## 2023-09-02 DIAGNOSIS — R4189 Other symptoms and signs involving cognitive functions and awareness: Secondary | ICD-10-CM

## 2023-09-02 DIAGNOSIS — E871 Hypo-osmolality and hyponatremia: Secondary | ICD-10-CM

## 2023-09-02 DIAGNOSIS — E222 Syndrome of inappropriate secretion of antidiuretic hormone: Secondary | ICD-10-CM

## 2023-09-02 DIAGNOSIS — Z8549 Personal history of malignant neoplasm of other male genital organs: Secondary | ICD-10-CM

## 2023-09-02 DIAGNOSIS — Z936 Other artificial openings of urinary tract status: Secondary | ICD-10-CM

## 2023-09-02 DIAGNOSIS — I480 Paroxysmal atrial fibrillation: Secondary | ICD-10-CM

## 2023-09-02 DIAGNOSIS — I700 Atherosclerosis of aorta: Secondary | ICD-10-CM | POA: Diagnosis not present

## 2023-09-02 DIAGNOSIS — I2510 Atherosclerotic heart disease of native coronary artery without angina pectoris: Secondary | ICD-10-CM

## 2023-09-02 DIAGNOSIS — K5909 Other constipation: Secondary | ICD-10-CM | POA: Diagnosis not present

## 2023-09-02 DIAGNOSIS — I739 Peripheral vascular disease, unspecified: Secondary | ICD-10-CM | POA: Diagnosis not present

## 2023-09-02 DIAGNOSIS — R29818 Other symptoms and signs involving the nervous system: Secondary | ICD-10-CM | POA: Diagnosis not present

## 2023-09-02 DIAGNOSIS — Z978 Presence of other specified devices: Secondary | ICD-10-CM | POA: Insufficient documentation

## 2023-09-02 HISTORY — DX: Presence of other specified devices: Z97.8

## 2023-09-02 HISTORY — DX: Other artificial openings of urinary tract status: Z93.6

## 2023-09-02 HISTORY — DX: Acquired absence of other genital organ(s): Z90.79

## 2023-09-02 NOTE — Progress Notes (Deleted)
Name: Caleb [name] DOB: [birth year] MRN: [id number]  History of Present Illness: Caleb [name] is a 89 y.o. male who presents today for follow up visit at [facility] [place]. - GU history: 1. Penile squamous cell carcinoma. - 02/04/2015: Underwent radical penectomy, bilateral inguinofemoral lymph node dissection, and perineal urethrostomy creation at [facility] by Caleb [name]; pT2N0(0/12)M0R0, 4.6 cm mass. - 02/27/2016: Underwent salvage left inguinofemoral lymph node dissection at [facility] by Caleb [name]; 2/3 nodes positive, 7.5 cm node, extranodal invasion. 2. BPH.  - 11/05/2008: Underwent TURP at [facility]. 3. Neurogenic bladder with chronic urinary retention. - Urodynamics in September 2010 demonstrated acontractile bladder. - Managed with chronic indwelling Foley catheter in perineal urethrostomy.  Last seen by Caleb [name] at [facility] on 05/06/2023. Unable to view outside visit note in EMR.  Since last visit: > 08/18/2023: Most recent nurse visit for catheter exchange. Patient reported dysuria. Urine culture positive for Enterococcus asburiae. Treated with Nitrofurantoin.  > 08/29/2023: Admitted for severe hyponatremia (Na 122) and hypokalemia (K 2.7) with elevated Cr (1.7) which is at/better than his baseline.  Per discharge note the plan was: - Continue 1,200cc fluid restriction and sodium chloride 1g BID, suggest recheck in next 3 days.  - Discharged to SNF for rehab due to muscular decondition and history of multiple falls.  ***?MASD:  - Nystatin powder, keep areas in groin and under pannus dry  ***BMP today ***next cath exchange due 09/18/2023 - can be done at SNF   Today: He reports ***  He reports the catheter is draining ***.  He {Actions; denies-reports:120008} gross hematuria.  He {Actions; denies-reports:120008} flank pain. He {Actions; denies-reports:120008} abdominal pain.  He {Actions; denies-reports:120008} fevers. He {Actions; denies-reports:120008}  nausea/vomiting.   Fall Screening: Do you usually have a device to assist in your mobility? {yes/no:20286} ***cane / ***Ruta / ***wheelchair   Medications: Current Outpatient Medications  Medication Sig Dispense Refill   Calcium Carbonate Antacid (TUMS PO) Take 1 tablet by mouth as needed (heartburn).     magnesium hydroxide (MILK OF MAGNESIA) 400 MG/5ML suspension Take 30 mLs by mouth daily as needed for mild constipation. 355 mL 0   metoprolol tartrate (LOPRESSOR) 25 MG tablet Take 0.5 tablets (12.5 mg total) by mouth 2 (two) times daily. 90 tablet 1   Multiple Vitamin (MULTIVITAMIN WITH MINERALS) TABS tablet Take 1 tablet by mouth daily. Centrum Silver     Multiple Vitamins-Minerals (PRESERVISION AREDS 2) CAPS Take 1 tablet by mouth daily.     nystatin (MYCOSTATIN/NYSTOP) powder Apply topically 3 (three) times daily. 15 g 0   polyethylene glycol powder (GLYCOLAX/MIRALAX) 17 GM/SCOOP powder Take 17 g by mouth daily. (Patient taking differently: Take 17 g by mouth as needed for mild constipation or moderate constipation.) 255 g 1   sodium chloride 1 g tablet Take 1 tablet (1 g total) by mouth 2 (two) times daily with a meal.     spironolactone (ALDACTONE) 25 MG tablet Take 0.5 tablets (12.5 mg total) by mouth daily. 30 tablet 3   No current facility-administered medications for this visit.    Allergies: No Known Allergies  Past Medical History:  Diagnosis Date   Coronary artery disease    Hyperlipidemia    Obesity    Penile cancer (HCC)    Swelling of extremity    Left Leg   Past Surgical History:  Procedure Laterality Date   APPENDECTOMY     BACK SURGERY     BIOPSY  07/22/2021   Procedure: BIOPSY;  Surgeon: Caleb Bologna, MD;  Location: [facility] ENDOSCOPY;  Service: Gastroenterology;;   CHOLECYSTECTOMY     CORONARY ANGIOPLASTY  11/2005   RCA PCI AND STENTING WITH A CYPHER DES   CORONARY ARTERY BYPASS GRAFT     ESOPHAGOGASTRODUODENOSCOPY (EGD) WITH PROPOFOL N/A 07/22/2021    Procedure: ESOPHAGOGASTRODUODENOSCOPY (EGD) WITH PROPOFOL;  Surgeon: Caleb Bologna, MD;  Location: [facility] ENDOSCOPY;  Service: Gastroenterology;  Laterality: N/A;   HIATAL HERNIA REPAIR     lymph node removal     NM MYOCAR MULTIPLE W/SPECT  11/26/2009   EF 62%. NORMAL MYOCARDIAL PERFUSION STUDY.   TRANSTHORACIC ECHOCARDIOGRAM  12/22/2005   MILD TO MOD AORTIC SCLEROSIS W/O STENOSIS. MILD TO MODERATE MITRAL CALCIFICATION. LA- MILDLY DILATED.   Family History  Problem Relation Age of Onset   Cancer Father    Cancer Sister    Cancer Brother    Cancer Sister    Cancer Other    Social History   Socioeconomic History   Marital status: Divorced    Spouse name: Not on file   Number of children: Not on file   Years of education: Not on file   Highest education level: Not on file  Occupational History   Occupation: retired    Associate Professor: RETIRED  Tobacco Use   Smoking status: Former    Current packs/day: 0.00    Types: Cigarettes    Quit date: 07/14/1983    Years since quitting: 40.1   Smokeless tobacco: Never  Vaping Use   Vaping status: Never Used  Substance and Sexual Activity   Alcohol use: No   Drug use: No   Sexual activity: Not on file  Other Topics Concern   Not on file  Social History Narrative   Not on file   Social Determinants of Health   Financial Resource Strain: Not on file  Food Insecurity: No Food Insecurity (08/29/2023)   Hunger Vital Sign    Worried About Running Out of Food in the Last Year: Never true    Ran Out of Food in the Last Year: Never true  Transportation Needs: No Transportation Needs (08/29/2023)   PRAPARE - Administrator, Civil Service (Medical): No    Lack of Transportation (Non-Medical): No  Physical Activity: Not on file  Stress: Not on file  Social Connections: Not on file  Intimate Partner Violence: Not At Risk (08/29/2023)   Humiliation, Afraid, Rape, and Kick questionnaire    Fear of Current or Ex-Partner: No    Emotionally  Abused: No    Physically Abused: No    Sexually Abused: No    Review of Systems Constitutional: Patient ***denies any unintentional weight loss or change in strength lntegumentary: Patient ***denies any rashes or pruritus Eyes: Patient denies ***dry eyes ENT: Patient ***denies dry mouth Cardiovascular: Patient ***denies chest pain or syncope Respiratory: Patient ***denies shortness of breath Gastrointestinal: Patient ***denies nausea, vomiting, constipation, or diarrhea Musculoskeletal: Patient ***denies muscle cramps or weakness Neurologic: Patient ***denies convulsions or seizures Psychiatric: Patient ***denies memory problems Allergic/Immunologic: Patient ***denies recent allergic reaction(s) Hematologic/Lymphatic: Patient denies bleeding tendencies Endocrine: Patient ***denies heat/cold intolerance  GU: As per HPI.  OBJECTIVE There were no vitals filed for this visit. There is no height or weight on file to calculate BMI.  Physical Examination Constitutional: ***No obvious distress; patient is ***non-toxic appearing  Cardiovascular: ***No visible lower extremity edema.  Respiratory: The patient does ***not have audible wheezing/stridor; respirations do ***not appear labored  Gastrointestinal: Abdomen ***non-distended Musculoskeletal: ***Normal ROM of  UEs  Skin: ***No obvious rashes/open sores  Neurologic: CN 2-12 grossly ***intact Psychiatric: Answered questions ***appropriately with ***normal affect  Hematologic/Lymphatic/Immunologic: ***No obvious bruises or sites of spontaneous bleeding  UA: ***negative / *** WBC/hpf, *** RBC/hpf, bacteria (***) PVR: *** ml  ASSESSMENT No diagnosis found. ***  Will plan for follow up in *** months / ***1 year or sooner if needed. Pt verbalized understanding and agreement. All questions were answered.  PLAN Advised the following: 1. *** 2. ***No follow-ups on file.  No orders of the defined types were placed in this  encounter.   It has been explained that the patient is to follow regularly with their PCP in addition to all other providers involved in their care and to follow instructions provided by these respective offices. Patient advised to contact urology clinic if any urologic-pertaining questions, concerns, new symptoms or problems arise in the interim period.  There are no Patient Instructions on file for this visit.  Electronically signed by:  Donnita Falls, FNP   09/02/23    11:20 AM

## 2023-09-02 NOTE — Progress Notes (Signed)
Location:  [facility] Room Number: 134 Place of Service:  SNF (31)   CODE STATUS: dnr   No Known Allergies  Chief Complaint  Patient presents with   Hospitalization Follow-up    HPI:  He is a 89 year old man who has been hospitalized from 08-29-23 through 08-31-23. His medical history includes: penile CA with long term foley; CAD s/p cabg; PVD: obesity. He presented to the ED due to fatigue. He was not clear on why he presented to the ED. He did not have fevers; chills or night sweats. He has a good appetite and drinks water "right much". He had had multiple falls; and uses wheelchair. He was treated for UTI with macrobid in August. He was found to have hyponatremia, hypokalemia. He was given a 500 cc fluid bolus in the ED. His sodium remains less than 130; this is presumed to chronic in nature. He was started on oral sodium. His labs indicated SIADH. He will need to be on a fluid restriction.  CAD: not on antiplatelet due to bruising mild anemia; hematoma and fall risk; will remain off antiplatelet therapy. He has a history of GI bleed in 2022.  He is here for short term rehab with his goal to return home. He will continue to be followed for his chronic illnesses including:  Paroxsymal atrial fibrillation:       Peripheral vascular disease:  Neurocognitive deficits:    Past Medical History:  Diagnosis Date   Coronary artery disease    Hyperlipidemia    Obesity    Penile cancer (HCC)    Swelling of extremity    Left Leg    Past Surgical History:  Procedure Laterality Date   APPENDECTOMY     BACK SURGERY     BIOPSY  07/22/2021   Procedure: BIOPSY;  Surgeon: Lynann Bologna, MD;  Location: [facility] ENDOSCOPY;  Service: Gastroenterology;;   CHOLECYSTECTOMY     CORONARY ANGIOPLASTY  11/2005   RCA PCI AND STENTING WITH A CYPHER DES   CORONARY ARTERY BYPASS GRAFT     ESOPHAGOGASTRODUODENOSCOPY (EGD) WITH PROPOFOL N/A 07/22/2021   Procedure: ESOPHAGOGASTRODUODENOSCOPY  (EGD) WITH PROPOFOL;  Surgeon: Lynann Bologna, MD;  Location: [facility] ENDOSCOPY;  Service: Gastroenterology;  Laterality: N/A;   HIATAL HERNIA REPAIR     lymph node removal     NM MYOCAR MULTIPLE W/SPECT  11/26/2009   EF 62%. NORMAL MYOCARDIAL PERFUSION STUDY.   TRANSTHORACIC ECHOCARDIOGRAM  12/22/2005   MILD TO MOD AORTIC SCLEROSIS W/O STENOSIS. MILD TO MODERATE MITRAL CALCIFICATION. LA- MILDLY DILATED.    Social History   Socioeconomic History   Marital status: Divorced    Spouse name: Not on file   Number of children: Not on file   Years of education: Not on file   Highest education level: Not on file  Occupational History   Occupation: retired    Associate Professor: RETIRED  Tobacco Use   Smoking status: Former    Current packs/day: 0.00    Types: Cigarettes    Quit date: 07/14/1983    Years since quitting: 40.1   Smokeless tobacco: Never  Vaping Use   Vaping status: Never Used  Substance and Sexual Activity   Alcohol use: No   Drug use: No   Sexual activity: Not on file  Other Topics Concern   Not on file  Social History Narrative   Not on file   Social Determinants of Health   Financial Resource Strain: Not on file  Food Insecurity: No Food Insecurity (08/29/2023)   Hunger Vital Sign    Worried About Running Out of Food in the Last Year: Never true    Ran Out of Food in the Last Year: Never true  Transportation Needs: No Transportation Needs (08/29/2023)   PRAPARE - Administrator, Civil Service (Medical): No    Lack of Transportation (Non-Medical): No  Physical Activity: Not on file  Stress: Not on file  Social Connections: Not on file  Intimate Partner Violence: Not At Risk (08/29/2023)   Humiliation, Afraid, Rape, and Kick questionnaire    Fear of Current or Ex-Partner: No    Emotionally Abused: No    Physically Abused: No    Sexually Abused: No   Family History  Problem Relation Age of Onset   Cancer Father    Cancer Sister    Cancer Brother    Cancer  Sister    Cancer Other       VITAL SIGNS BP (!) 122/58   Pulse 72   Temp 98.4 F (36.9 C)   Resp 18   Ht 5\' 10"  (1.778 m)   Wt 241 lb 1.6 oz (109.4 kg)   SpO2 97%   BMI 34.59 kg/m   Outpatient Encounter Medications as of 09/02/2023  Medication Sig   Calcium Carbonate Antacid (TUMS PO) Take 1 tablet by mouth as needed (heartburn).   magnesium hydroxide (MILK OF MAGNESIA) 400 MG/5ML suspension Take 30 mLs by mouth daily as needed for mild constipation.   metoprolol tartrate (LOPRESSOR) 25 MG tablet Take 0.5 tablets (12.5 mg total) by mouth 2 (two) times daily.   Multiple Vitamin (MULTIVITAMIN WITH MINERALS) TABS tablet Take 1 tablet by mouth daily. Centrum Silver   Multiple Vitamins-Minerals (PRESERVISION AREDS 2) CAPS Take 1 tablet by mouth daily.   nystatin (MYCOSTATIN/NYSTOP) powder Apply topically 3 (three) times daily.   polyethylene glycol powder (GLYCOLAX/MIRALAX) 17 GM/SCOOP powder Take 17 g by mouth daily. (Patient taking differently: Take 17 g by mouth as needed for mild constipation or moderate constipation.)   sodium chloride 1 g tablet Take 1 tablet (1 g total) by mouth 2 (two) times daily with a meal.   spironolactone (ALDACTONE) 25 MG tablet Take 0.5 tablets (12.5 mg total) by mouth daily.   No facility-administered encounter medications on file as of 09/02/2023.     SIGNIFICANT DIAGNOSTIC EXAMS  TODAY  08-29-23: wbc 7.8; hgb 11.0; hct 32.0; mcv 94.1 plt 184; glucose 127; bun 40; creat 1.67; k+ 2.7; na++ 122; ca 8.8; gfr 37; protein 6.5 albumin 3.8; mag 3.8; tsh 2.133 09-01-23: glucose 120; bun 43; creat 1.97; k+ 3.9; na++ 132; ca 8.9; gfr 31   Review of Systems  Constitutional:  Negative for malaise/fatigue.  Respiratory:  Negative for cough and shortness of breath.   Cardiovascular:  Negative for chest pain, palpitations and leg swelling.  Gastrointestinal:  Negative for abdominal pain, constipation and heartburn.  Musculoskeletal:  Negative for back pain, joint  pain and myalgias.  Skin: Negative.   Neurological:  Negative for dizziness.  Psychiatric/Behavioral:  The patient is not nervous/anxious.     Physical Exam Constitutional:      General: He is not in acute distress.    Appearance: He is well-developed. He is obese. He is not diaphoretic.  Neck:     Thyroid: No thyromegaly.  Cardiovascular:     Rate and Rhythm: Normal rate and regular rhythm.     Pulses: Normal pulses.  Heart sounds: Normal heart sounds.  Pulmonary:     Effort: Pulmonary effort is normal. No respiratory distress.     Breath sounds: Normal breath sounds.  Abdominal:     General: Bowel sounds are normal. There is no distension.     Palpations: Abdomen is soft.     Tenderness: There is no abdominal tenderness.  Genitourinary:    Comments: foley Musculoskeletal:        General: Normal range of motion.     Cervical back: Neck supple.     Right lower leg: No edema.     Left lower leg: No edema.  Lymphadenopathy:     Cervical: No cervical adenopathy.  Skin:    General: Skin is warm and dry.  Neurological:     Mental Status: He is alert and oriented to person, place, and time.  Psychiatric:        Mood and Affect: Mood normal.      ASSESSMENT/ PLAN:  TODAY  SIADH (syndrome of inappropriate ADH production)/hyponatremia na++ 132; is on NACL 1 gm twice daily; and is on fluid restriction  2. Paroxsymal atrial fibrillation: heart rate is stable will continue lopressor 12.5 mg twice daily   3. Peripheral vascular disease: will monitor  4. Neurocognitive deficits: ct of 08-29-23 demonstrates: Atrophy with chronic small vessel white matter ischemic disease.   5. Stage 3b chronic kidney disease (CKD): bun 43; creat 1.97 gfr 31  6. History of penile cancer/neurogenic bladder: has long term foley  7. Chronic constipation: will continue miralax daily   8. Coronary artery disease involving native coronary artery of native heart without angina pectoris.   9.  Aortic calcification: (Ct 2-20--17)    Synthia Innocent NP [facility]   call [phone]

## 2023-09-03 ENCOUNTER — Encounter: Payer: Self-pay | Admitting: Internal Medicine

## 2023-09-03 ENCOUNTER — Non-Acute Institutional Stay (SKILLED_NURSING_FACILITY): Payer: Medicare Other | Admitting: Internal Medicine

## 2023-09-03 DIAGNOSIS — E871 Hypo-osmolality and hyponatremia: Secondary | ICD-10-CM | POA: Diagnosis not present

## 2023-09-03 DIAGNOSIS — R4189 Other symptoms and signs involving cognitive functions and awareness: Secondary | ICD-10-CM

## 2023-09-03 DIAGNOSIS — I700 Atherosclerosis of aorta: Secondary | ICD-10-CM | POA: Insufficient documentation

## 2023-09-03 DIAGNOSIS — R29818 Other symptoms and signs involving the nervous system: Secondary | ICD-10-CM

## 2023-09-03 DIAGNOSIS — I480 Paroxysmal atrial fibrillation: Secondary | ICD-10-CM | POA: Diagnosis not present

## 2023-09-03 DIAGNOSIS — E222 Syndrome of inappropriate secretion of antidiuretic hormone: Secondary | ICD-10-CM

## 2023-09-03 DIAGNOSIS — K5909 Other constipation: Secondary | ICD-10-CM

## 2023-09-03 DIAGNOSIS — N1832 Chronic kidney disease, stage 3b: Secondary | ICD-10-CM

## 2023-09-03 HISTORY — DX: Other constipation: K59.09

## 2023-09-03 HISTORY — DX: Syndrome of inappropriate secretion of antidiuretic hormone: E22.2

## 2023-09-03 HISTORY — DX: Atherosclerosis of aorta: I70.0

## 2023-09-03 NOTE — Assessment & Plan Note (Signed)
POTUS named as Midwife.

## 2023-09-03 NOTE — Assessment & Plan Note (Signed)
Fluid restriction of 1200 cc daily at SNF with monitor of sodium.

## 2023-09-03 NOTE — Assessment & Plan Note (Signed)
Final creatinine 1.97 with estimated GFR 31 indicating low stage IIIb.  Med list reviewed; no change in meds or dosage indicated at this time; but close monitor as on Spironolactone.

## 2023-09-03 NOTE — Assessment & Plan Note (Addendum)
08/29/2023 EKG revealed NSR. Clinically his rhythm is sinus and rate is well-controlled.

## 2023-09-03 NOTE — Progress Notes (Unsigned)
NURSING HOME LOCATION:  [facility] ROOM NUMBER:  134P  CODE STATUS: DNR   PCP: [name] MD  This is a comprehensive admission note to this SNFperformed on this date less than 30 days from date of admission. Included are preadmission medical/surgical history; reconciled medication list; family history; social history and comprehensive review of systems.  Corrections and additions to the records were documented. Comprehensive physical exam was also performed. Additionally a clinical summary was entered for each active diagnosis pertinent to this admission in the Problem List to enhance continuity of care.  HPI: He was hospitalized 9/1 - 09/01/2023 presenting to the ED with fatigue.  This was in the context of possible significant water intake but poor solid food intake.  It was reported that he had had multiple falls PTA.  Exam revealed scattered ecchymoses compatible senile purpura as well as a sizable hematoma left shin suggesting prior trauma.  Pelvic imaging was negative for fracture. Significant hyponatremia was present with a sodium of 122; potassium was 3.7 and creatinine 1.7 and estimated GFR 37. 500 cc of normal saline was administered and fluid restriction instituted resulting in improvement in hyponatremia.  Oral salt tablets were added. Chart review indicated that he had had hyponatremia on prior admission.  A.m. cortisol was normal at 13.2.  Serum osmolality was in the 270s.  Initial urine sodium was elevated at 62 but subsequently was 30, suggesting SIADH.  Final creatinine was 1.97 and estimated GFR 31 indicating low stage IIIb CKD.  Normochromic, normocytic anemia was essentially stable with H/H of 11.0/32.0. 1200 cc daily fluid restriction once recommended with continuation of sodium chloride 1 g twice daily.  Final sodium was 132.  Hypokalemia resolved with supplementation.  Spironolactone was continued. PT/OT recommended SNF placement for rehab due to his  deconditioning.  Past medical and surgical history: Includes history of CAD, history of PAF, stage IIIb CKD, history of penile cancer, neurogenic bladder and dyslipidemia. Surgeries and procedures include coronary angioplasty, cholecystectomy, CABG, EGD, hiatal hernia repair, and permanent urinary catheter.  Social history: Former smoker, nondrinker.  Family history: Reviewed, noncontributory due to advanced age.   Review of systems: Clinical neurocognitive deficits made validity of responses questionable.  When asked the date he activated his watch which gave the correct date.  When asked the present POTUS his response was "Obama."  He has no understanding of why he was hospitalized and what diagnosis was made.  When asked , his response was "not sure about that ."  He believes he was not having any diagnosis; he stated "I felt it was just dripping I recommended. weak knees."  Very he denied any neurologic or cardiac prodrome prior to falls.  Review of systems was essentially negative except for occasional constipation.  Initially evaluated diarrhea but then corrected this.  When asked how much water he was ingesting at home , he could not quantitate this.  Constitutional: No fever, significant weight change, fatigue  Eyes: No redness, discharge, pain, vision change ENT/mouth: No nasal congestion, purulent discharge , change in hearing, sore throat  Cardiovascular: No chest pain, palpitations, paroxysmal nocturnal dyspnea, claudication, edema  Respiratory: No cough, sputum production, hemoptysis, DOE, significant snoring, apnea Gastrointestinal: No heartburn, dysphagia, abdominal pain, nausea /vomiting, rectal bleeding, melena, change in bowels Genitourinary: No dysuria, hematuria, pyuria, incontinence, nocturia Musculoskeletal: No joint stiffness, joint swelling, weakness, pain Dermatologic: No rash, pruritus, change in appearance of skin Neurologic: No dizziness, headache, syncope, seizures,  numbness, tingling Psychiatric: No significant  anxiety, depression, insomnia, anorexia Endocrine: No change in hair/skin/nails, excessive thirst, excessive hunger, excessive urination  Hematologic/lymphatic: No significant bruising, lymphadenopathy, abnormal bleeding Allergy/immunology: No itchy/watery eyes, significant sneezing, urticaria, angioedema  Physical exam:  Pertinent or positive findings:  Affect is somewhat flat . There is slight exotropia on the right.  Grade 1/2 systolic murmur is present at the basilar sternal border.  Breath sounds are decreased; he has minimal low-grade rales at the right base posteriorly.  Abdomen is protuberant.  Foley catheter is in place.  Pedal pulses are not palpable.  He has 3/4+ edema at the right sock line and trace to one half of the left foot.  Interosseous wasting is present.  He has extensive ecchymoses over the forearms.  There is a hematoma at the medial distal left lower extremity.  Band-Aid is present over the right elbow.  General appearance: Adequately nourished; no acute distress, increased work of breathing is present.   Lymphatic: No lymphadenopathy about the head, neck, axilla. Eyes: No conjunctival inflammation or lid edema is present. There is no scleral icterus. Ears:  External ear exam shows no significant lesions or deformities.   Nose:  External nasal examination shows no deformity or inflammation. Nasal mucosa are pink and moist without lesions, exudates Oral exam: Lips and gums are healthy appearing.There is no oropharyngeal erythema or exudate. Neck:  No thyromegaly, masses, tenderness noted.    Heart:  Normal rate and regular rhythm. S1 and S2 normal without gallop, murmur, click, rub.  Lungs: Chest clear to auscultation without wheezes, rhonchi, rales, rubs. Abdomen: Bowel sounds are normal.  Abdomen is soft and nontender with no organomegaly, hernias, masses. GU: Deferred  Extremities:  No cyanosis, clubbing, edema. Neurologic  exam:  Strength equal  in upper & lower extremities. Balance, Rhomberg, finger to nose testing could not be completed due to clinical state Deep tendon reflexes are equal Skin: Warm & dry w/o tenting. No significant lesions or rash.  See clinical summary under each active problem in the Problem List with associated updated therapeutic plan

## 2023-09-03 NOTE — Patient Instructions (Signed)
See assessment and plan under each diagnosis in the problem list and acutely for this visit 

## 2023-09-09 ENCOUNTER — Other Ambulatory Visit (HOSPITAL_COMMUNITY)
Admission: RE | Admit: 2023-09-09 | Discharge: 2023-09-09 | Disposition: A | Discharge status: Home / Self Care | Payer: Medicare Other | Source: Skilled Nursing Facility | Attending: Adult Health | Admitting: Adult Health

## 2023-09-09 DIAGNOSIS — E871 Hypo-osmolality and hyponatremia: Secondary | ICD-10-CM | POA: Insufficient documentation

## 2023-09-09 LAB — BASIC METABOLIC PANEL
Anion gap: 9 (ref 5–15)
BUN: 37 mg/dL — ABNORMAL HIGH (ref 8–23)
CO2: 24 mmol/L (ref 22–32)
Calcium: 8.8 mg/dL — ABNORMAL LOW (ref 8.9–10.3)
Chloride: 104 mmol/L (ref 98–111)
Creatinine, Ser: 1.52 mg/dL — ABNORMAL HIGH (ref 0.61–1.24)
GFR, Estimated: 42 mL/min — ABNORMAL LOW (ref >60)
Glucose, Bld: 86 mg/dL (ref 70–99)
Potassium: 4.5 mmol/L (ref 3.5–5.1)
Sodium: 137 mmol/L (ref 135–145)

## 2023-09-16 ENCOUNTER — Other Ambulatory Visit: Payer: Self-pay | Admitting: *Deleted

## 2023-09-16 ENCOUNTER — Encounter: Payer: Self-pay | Admitting: Adult Health

## 2023-09-16 NOTE — Progress Notes (Deleted)
Location:  [facility] Room Number: South/134/P Place of Service:  SNF (31) Provider:  Synthia Innocent, ANP  [name], MD  Patient Care Team: [name], MD as PCP - General (Internal Medicine)  Extended Emergency Contact Information Primary Emergency Contact: Drucie Opitz, [place] Home Phone: [phone] Mobile Phone: [phone] Relation: Son Secondary Emergency Contact: [facility] Phone: [phone] Relation: Son  Code Status:  Full Code Goals of care: Advanced Directive information    09/16/2023    4:20 PM  Advanced Directives  Does Patient Have a Medical Advance Directive? No     Chief Complaint  Patient presents with  . Acute Visit    Care Plan Meeting  . Error    HPI:  Pt is a 89 y.o. male seen today for an acute visit for    Past Medical History:  Diagnosis Date  . Coronary artery disease   . Hyperlipidemia   . Obesity   . Penile cancer (HCC)   . Swelling of extremity    Left Leg   Past Surgical History:  Procedure Laterality Date  . APPENDECTOMY    . BACK SURGERY    . BIOPSY  07/22/2021   Procedure: BIOPSY;  Surgeon: Lynann Bologna, MD;  Location: [facility] ENDOSCOPY;  Service: Gastroenterology;;  . CHOLECYSTECTOMY    . CORONARY ANGIOPLASTY  11/2005   RCA PCI AND STENTING WITH A CYPHER DES  . CORONARY ARTERY BYPASS GRAFT    . ESOPHAGOGASTRODUODENOSCOPY (EGD) WITH PROPOFOL N/A 07/22/2021   Procedure: ESOPHAGOGASTRODUODENOSCOPY (EGD) WITH PROPOFOL;  Surgeon: Lynann Bologna, MD;  Location: [facility] ENDOSCOPY;  Service: Gastroenterology;  Laterality: N/A;  . HIATAL HERNIA REPAIR    . lymph node removal    . NM MYOCAR MULTIPLE W/SPECT  11/26/2009   EF 62%. NORMAL MYOCARDIAL PERFUSION STUDY.  . TRANSTHORACIC ECHOCARDIOGRAM  12/22/2005   MILD TO MOD AORTIC SCLEROSIS W/O STENOSIS. MILD TO MODERATE MITRAL CALCIFICATION. LA- MILDLY DILATED.    No Known Allergies  Outpatient Encounter  Medications as of 09/16/2023  Medication Sig  . metoprolol tartrate (LOPRESSOR) 25 MG tablet Take 0.5 tablets (12.5 mg total) by mouth 2 (two) times daily.  . Multiple Vitamins-Minerals (PRESERVISION AREDS 2) CAPS Take 1 tablet by mouth daily.  Marland Kitchen nystatin (MYCOSTATIN/NYSTOP) powder Apply topically 3 (three) times daily.  . sennosides-docusate sodium (SENOKOT-S) 8.6-50 MG tablet Take 1 tablet by mouth daily.  . sodium chloride 1 g tablet Take 1 tablet (1 g total) by mouth 2 (two) times daily with a meal.  . spironolactone (ALDACTONE) 25 MG tablet Take 0.5 tablets (12.5 mg total) by mouth daily.  . [DISCONTINUED] Calcium Carbonate Antacid (TUMS PO) Take 1 tablet by mouth as needed (heartburn).  . [DISCONTINUED] magnesium hydroxide (MILK OF MAGNESIA) 400 MG/5ML suspension Take 30 mLs by mouth daily as needed for mild constipation.  . [DISCONTINUED] Multiple Vitamin (MULTIVITAMIN WITH MINERALS) TABS tablet Take 1 tablet by mouth daily. Centrum Silver  . [DISCONTINUED] polyethylene glycol powder (GLYCOLAX/MIRALAX) 17 GM/SCOOP powder Take 17 g by mouth daily. (Patient taking differently: Take 17 g by mouth as needed for mild constipation or moderate constipation.)   No facility-administered encounter medications on file as of 09/16/2023.    Review of Systems  Immunization History  Administered Date(s) Administered  . H1N1 10/30/2015  . Influenza, High Dose Seasonal PF 10/16/2020  . Influenza-Unspecified 10/28/2014, 09/27/2018, 11/17/2022  . Moderna Sars-Covid-2 Vaccination 01/09/2020, 02/09/2020, 12/12/2020, 11/13/2022  .  Pneumococcal Polysaccharide-23 11/15/1995, 10/19/2006  . Tdap 01/12/2016   Pertinent  Health Maintenance Due  Topic Date Due  . INFLUENZA VACCINE  07/29/2023      01/01/2022   12:40 AM 02/01/2022    5:54 PM 02/17/2022    2:48 PM 02/21/2022    6:19 PM 05/25/2022   10:02 AM  Fall Risk  (RETIRED) Patient Fall Risk Level High fall risk Low fall risk Low fall risk Moderate fall  risk Low fall risk   Functional Status Survey:    There were no vitals filed for this visit. There is no height or weight on file to calculate BMI. Physical Exam  Labs reviewed: Recent Labs    07/02/23 1133 07/03/23 0029 08/29/23 1554 08/29/23 2143 08/31/23 0421 08/31/23 1527 09/01/23 0425 09/09/23 0800  NA  --    < > 124*   < > 129* 129* 132* 137  K  --    < > 2.8*   < > 3.9  --  3.9 4.5  CL  --    < > 78*   < > 88*  --  90* 104  CO2  --    < > 34*   < > 30  --  30 24  GLUCOSE  --    < > 122*   < > 104*  --  120* 86  BUN  --    < > 39*   < > 44*  --  43* 37*  CREATININE  --    < > 1.70*   < > 1.81*  --  1.97* 1.52*  CALCIUM  --    < > 8.9   < > 8.7*  --  8.9 8.8*  MG 2.1  --  3.8*  --   --   --   --   --   PHOS 3.5  --   --   --   --   --   --   --    < > = values in this interval not displayed.    Recent Labs    07/07/23 1257 07/16/23 2119 08/29/23 1146  AST 24 28 28   ALT 22 20 20   ALKPHOS 52 63 52  BILITOT 0.5 0.6 1.0  PROT 6.7 6.9 6.5  ALBUMIN 3.5 3.9 3.8    Recent Labs    07/01/23 1853 07/02/23 0658 07/07/23 1257 07/16/23 2119 08/29/23 1146  WBC 7.6   < > 7.7 11.2* 7.8  NEUTROABS 6.6  --   --  8.8* 5.9  HGB 11.5*   < > 10.8* 11.4* 11.0*  HCT 33.1*   < > 32.7* 32.8* 32.0*  MCV 92.7   < > 96.2 92.7 94.1  PLT 206   < > 199 180 184   < > = values in this interval not displayed.    Lab Results  Component Value Date   TSH 2.133 08/29/2023   Lab Results  Component Value Date   HGBA1C 5.3 11/12/2021   Lab Results  Component Value Date   CHOL 121 06/15/2014   HDL 42 06/15/2014   LDLCALC 67 06/15/2014   TRIG 59 06/15/2014   CHOLHDL 2.9 06/15/2014    Significant Diagnostic Results in last 30 days:  DG Pelvis Portable  Result Date: 08/29/2023 CLINICAL DATA:  Shortness of breath, fall with pain. EXAM: PORTABLE PELVIS 1-2 VIEWS COMPARISON:  Hip radiographs dated 07/11/2021. FINDINGS: There is no evidence of pelvic fracture or diastasis.  Degenerative changes are seen in the  spine. Mild bilateral hip osteoarthritis is noted. IMPRESSION: No acute osseous injury. Electronically Signed   By: Romona Curls M.D.   On: 08/29/2023 12:30   DG Chest Port 1 View  Result Date: 08/29/2023 CLINICAL DATA:  Shortness of breath. EXAM: PORTABLE CHEST 1 VIEW COMPARISON:  07/01/2023 FINDINGS: Low volume film. The cardio pericardial silhouette is enlarged. The lungs are clear without focal pneumonia, edema, pneumothorax or pleural effusion. Interstitial markings are diffusely coarsened with chronic features. Nonacute left-sided rib fractures again noted. Chronic posttraumatic deformity in the left shoulder is stable. IMPRESSION: No acute cardiopulmonary findings. Electronically Signed   By: Kennith Center M.D.   On: 08/29/2023 12:29   CT Head Wo Contrast  Result Date: 08/29/2023 CLINICAL DATA:  Headache with increasing frequency.  Multiple falls. EXAM: CT HEAD WITHOUT CONTRAST TECHNIQUE: Contiguous axial images were obtained from the base of the skull through the vertex without intravenous contrast. RADIATION DOSE REDUCTION: This exam was performed according to the departmental dose-optimization program which includes automated exposure control, adjustment of the mA and/or kV according to patient size and/or use of iterative reconstruction technique. COMPARISON:  None Available.  07/11/2021 FINDINGS: Brain: There is no evidence for acute hemorrhage, hydrocephalus, mass lesion, or abnormal extra-axial fluid collection. No definite CT evidence for acute infarction. Diffuse loss of parenchymal volume is consistent with atrophy. Patchy low attenuation in the deep hemispheric and periventricular white matter is nonspecific, but likely reflects chronic microvascular ischemic demyelination. Stable lacunar infarct in the left thalamus. Vascular: No hyperdense vessel or unexpected calcification. Skull: No evidence for fracture. No worrisome lytic or sclerotic lesion.  Sinuses/Orbits: The visualized paranasal sinuses and mastoid air cells are clear. Visualized portions of the globes and intraorbital fat are unremarkable. Other: None. IMPRESSION: 1. No acute intracranial abnormality. 2. Atrophy with chronic small vessel white matter ischemic disease. Electronically Signed   By: Kennith Center M.D.   On: 08/29/2023 12:11    Assessment/Plan There are no diagnoses linked to this encounter.   Family/ staff Communication: ***  Labs/tests ordered:  ***  This encounter was created in error - please disregard.

## 2023-09-16 NOTE — Patient Outreach (Signed)
[name] resides in [place] skilled nursing facility. Screening for potential care coordination/chronic care management services as benefit of health plan and Primary Care Provider.   Collaboration with Melton Alar social worker. Anticipated discharge plan is to transition to ALF near his son Caleb [name].   Will continue to follow in case transition plans change.   Raiford Noble, MSN, RN, BSN [place]  [facility], [facility] RN Post- Acute Care Coordinator Direct Dial: [phone]

## 2023-09-17 ENCOUNTER — Non-Acute Institutional Stay (SKILLED_NURSING_FACILITY): Payer: Medicare Other | Admitting: Adult Health

## 2023-09-17 ENCOUNTER — Encounter: Payer: Self-pay | Admitting: Adult Health

## 2023-09-17 DIAGNOSIS — Z936 Other artificial openings of urinary tract status: Secondary | ICD-10-CM | POA: Diagnosis not present

## 2023-09-17 DIAGNOSIS — E222 Syndrome of inappropriate secretion of antidiuretic hormone: Secondary | ICD-10-CM

## 2023-09-17 DIAGNOSIS — F01518 Vascular dementia, unspecified severity, with other behavioral disturbance: Secondary | ICD-10-CM | POA: Insufficient documentation

## 2023-09-17 DIAGNOSIS — F0150 Vascular dementia without behavioral disturbance: Secondary | ICD-10-CM | POA: Insufficient documentation

## 2023-09-17 DIAGNOSIS — N1832 Chronic kidney disease, stage 3b: Secondary | ICD-10-CM | POA: Diagnosis not present

## 2023-09-17 HISTORY — DX: Vascular dementia, unspecified severity, with other behavioral disturbance: F01.518

## 2023-09-17 NOTE — Progress Notes (Signed)
Location:  [facility] Room Number: South/134/P Place of Service:  SNF (31)   CODE STATUS: full   No Known Allergies  Chief Complaint  Patient presents with   Acute Visit    Care Plan Meeting    HPI:  We have come together for his care plan meeting. Family present. BIMS 12/15 mood 5/30: not sleeping well; decreased energy; decreased appetite. BCAT 24/50. He is using wheelchair without falls since admission. He requires mod to max assist for his adls. He has a foley and is frequently incontinent of bowel. Dietary: regular diet with 1200 cc fluid restriction; will increase fluids to 1500 cc daily. Requires setup for meals, weight is 242.6 pounds. Setup for meals.  Therapy: ambulate 120 feet with rolling Nunn and supervision. He requires upper body supervision, lower body mod asist; brp mod assist. Activities: personal activities. He continues to be followed for his chronic illnesses including:  SIADH    Stage 3b chronic kidney disease    Status post urethrostomy  Past Medical History:  Diagnosis Date   Coronary artery disease    Hyperlipidemia    Obesity    Penile cancer (HCC)    Swelling of extremity    Left Leg    Past Surgical History:  Procedure Laterality Date   APPENDECTOMY     BACK SURGERY     BIOPSY  07/22/2021   Procedure: BIOPSY;  Surgeon: Lynann Bologna, MD;  Location: [facility] ENDOSCOPY;  Service: Gastroenterology;;   CHOLECYSTECTOMY     CORONARY ANGIOPLASTY  11/2005   RCA PCI AND STENTING WITH A CYPHER DES   CORONARY ARTERY BYPASS GRAFT     ESOPHAGOGASTRODUODENOSCOPY (EGD) WITH PROPOFOL N/A 07/22/2021   Procedure: ESOPHAGOGASTRODUODENOSCOPY (EGD) WITH PROPOFOL;  Surgeon: Lynann Bologna, MD;  Location: [facility] ENDOSCOPY;  Service: Gastroenterology;  Laterality: N/A;   HIATAL HERNIA REPAIR     lymph node removal     NM MYOCAR MULTIPLE W/SPECT  11/26/2009   EF 62%. NORMAL MYOCARDIAL PERFUSION STUDY.   TRANSTHORACIC ECHOCARDIOGRAM  12/22/2005   MILD  TO MOD AORTIC SCLEROSIS W/O STENOSIS. MILD TO MODERATE MITRAL CALCIFICATION. LA- MILDLY DILATED.    Social History   Socioeconomic History   Marital status: Divorced    Spouse name: Not on file   Number of children: Not on file   Years of education: Not on file   Highest education level: Not on file  Occupational History   Occupation: retired    Associate Professor: RETIRED  Tobacco Use   Smoking status: Former    Current packs/day: 0.00    Types: Cigarettes    Quit date: 07/14/1983    Years since quitting: 40.2   Smokeless tobacco: Never  Vaping Use   Vaping status: Never Used  Substance and Sexual Activity   Alcohol use: No   Drug use: No   Sexual activity: Not on file  Other Topics Concern   Not on file  Social History Narrative   Not on file   Social Determinants of Health   Financial Resource Strain: Not on file  Food Insecurity: No Food Insecurity (08/29/2023)   Hunger Vital Sign    Worried About Running Out of Food in the Last Year: Never true    Ran Out of Food in the Last Year: Never true  Transportation Needs: No Transportation Needs (08/29/2023)   PRAPARE - Administrator, Civil Service (Medical): No    Lack of Transportation (Non-Medical): No  Physical Activity: Not on  file  Stress: Not on file  Social Connections: Not on file  Intimate Partner Violence: Not At Risk (08/29/2023)   Humiliation, Afraid, Rape, and Kick questionnaire    Fear of Current or Ex-Partner: No    Emotionally Abused: No    Physically Abused: No    Sexually Abused: No   Family History  Problem Relation Age of Onset   Cancer Father    Cancer Sister    Cancer Brother    Cancer Sister    Cancer Other       VITAL SIGNS BP 128/80   Pulse 77   Temp (!) 96.8 F (36 C)   Resp 20   Ht 5\' 10"  (1.778 m)   Wt 242 lb 9.6 oz (110 kg)   SpO2 97%   BMI 34.81 kg/m   Outpatient Encounter Medications as of 09/17/2023  Medication Sig   metoprolol tartrate (LOPRESSOR) 25 MG tablet Take  0.5 tablets (12.5 mg total) by mouth 2 (two) times daily.   Multiple Vitamins-Minerals (PRESERVISION AREDS 2) CAPS Take 1 tablet by mouth daily.   nystatin (MYCOSTATIN/NYSTOP) powder Apply topically 3 (three) times daily.   sennosides-docusate sodium (SENOKOT-S) 8.6-50 MG tablet Take 1 tablet by mouth daily.   sodium chloride 1 g tablet Take 1 tablet (1 g total) by mouth 2 (two) times daily with a meal.   spironolactone (ALDACTONE) 25 MG tablet Take 0.5 tablets (12.5 mg total) by mouth daily.   No facility-administered encounter medications on file as of 09/17/2023.     SIGNIFICANT DIAGNOSTIC EXAMS  TODAY  08-29-23: wbc 7.8; hgb 11.0; hct 32.0; mcv 94.1 plt 184; glucose 127; bun 40; creat 1.67; k+ 2.7; na++ 122; ca 8.8; gfr 37; protein 6.5 albumin 3.8; mag 3.8; tsh 2.133 09-01-23: glucose 120; bun 43; creat 1.97; k+ 3.9; na++ 132; ca 8.9; gfr 31  09-09-23: glucose 86; bun 37; creat 1.52; k+ 4.5; na++ 137; ca 8.8; gfr 42   Review of Systems  Constitutional:  Negative for malaise/fatigue.  Respiratory:  Negative for cough and shortness of breath.   Cardiovascular:  Negative for chest pain, palpitations and leg swelling.  Gastrointestinal:  Negative for abdominal pain, constipation and heartburn.  Musculoskeletal:  Negative for back pain, joint pain and myalgias.  Skin: Negative.   Neurological:  Negative for dizziness.  Psychiatric/Behavioral:  The patient is not nervous/anxious.    Physical Exam Constitutional:      General: He is not in acute distress.    Appearance: He is well-developed and overweight. He is not diaphoretic.  Neck:     Thyroid: No thyromegaly.  Cardiovascular:     Rate and Rhythm: Normal rate and regular rhythm.     Heart sounds: Normal heart sounds.  Pulmonary:     Effort: Pulmonary effort is normal. No respiratory distress.     Breath sounds: Normal breath sounds.  Abdominal:     General: Bowel sounds are normal. There is no distension.     Palpations: Abdomen  is soft.     Tenderness: There is no abdominal tenderness.  Genitourinary:    Comments: foley Musculoskeletal:        General: Normal range of motion.     Cervical back: Neck supple.     Right lower leg: No edema.     Left lower leg: No edema.  Lymphadenopathy:     Cervical: No cervical adenopathy.  Skin:    General: Skin is warm and dry.  Neurological:     Mental  Status: He is alert. Mental status is at baseline.  Psychiatric:        Mood and Affect: Mood normal.     ASSESSMENT/ PLAN:  TODAY  SIADH  Stage 3b chronic kidney disease Status post urethrostomy  Will continue therapy as directed The goal of his care at this time is for long term placement Will continue current medications Will continue to monitor his status.    Time spent with patient: 40 minutes: medications; dementia discussion; goals of care; therapy.    Synthia Innocent NP [facility]   call [phone]

## 2023-09-17 NOTE — Progress Notes (Signed)
Error

## 2023-09-20 ENCOUNTER — Encounter: Payer: Self-pay | Admitting: Internal Medicine

## 2023-09-20 ENCOUNTER — Ambulatory Visit: Payer: Medicare Other

## 2023-09-21 NOTE — Telephone Encounter (Signed)
SNF patient, communication and correspondence to be handled through the facility staff and providers

## 2023-09-22 ENCOUNTER — Non-Acute Institutional Stay (SKILLED_NURSING_FACILITY): Payer: Medicare Other | Admitting: Internal Medicine

## 2023-09-22 ENCOUNTER — Encounter: Payer: Self-pay | Admitting: Internal Medicine

## 2023-09-22 DIAGNOSIS — I872 Venous insufficiency (chronic) (peripheral): Secondary | ICD-10-CM | POA: Insufficient documentation

## 2023-09-22 DIAGNOSIS — N1832 Chronic kidney disease, stage 3b: Secondary | ICD-10-CM | POA: Diagnosis not present

## 2023-09-22 DIAGNOSIS — F0150 Vascular dementia without behavioral disturbance: Secondary | ICD-10-CM

## 2023-09-22 DIAGNOSIS — E871 Hypo-osmolality and hyponatremia: Secondary | ICD-10-CM

## 2023-09-22 DIAGNOSIS — S81802A Unspecified open wound, left lower leg, initial encounter: Secondary | ICD-10-CM | POA: Diagnosis not present

## 2023-09-22 HISTORY — DX: Venous insufficiency (chronic) (peripheral): I87.2

## 2023-09-22 NOTE — Patient Instructions (Addendum)
See assessment and plan under each diagnosis in the problem list and acutely for this visit HCPOA Ules Benson [phone]) was updated as to status.  He can request copies of the MMSE exams completed 9/5 and 9/11 as well as a copy of this report from [facility] RN .

## 2023-09-22 NOTE — Assessment & Plan Note (Signed)
He has significant asymmetric edema with right lower extremity visibly larger than the left with bilateral pitting.  According to his son this has been a chronic issue.  The edema is in the context of  normal albumin and total protein.  D-dimer will be checked to rule out DVT in the right lower extremity.

## 2023-09-22 NOTE — Progress Notes (Signed)
NURSING HOME LOCATION:  [facility] ROOM NUMBER:   134 P  CODE STATUS:  DNR  PCP:  [name] MD  This is a nursing facility follow up visit for specific acute issue of left lower extremity wound & to document mental status & competency.  Interim medical record and care since last SNF visit was updated with review of diagnostic studies and change in clinical status since last visit were documented.  HPI: [facility] Wound Care Nurse has documented a lower extremity wound and has voiced concerns about possible associated cellulitis. Additionally his HCPOA Tramayne Bourgeois ([phone]) has asked for an assessment of his mental status/competency.  Initially the patient had expressed a desire to go home where he has had a caregiver.  According to the [facility] that caregiver has a history of recurrent syncope and has had her driving license revoked.  Additionally when the patient has experienced a fall she has been unable to help him up. He is in active rehab and based on a care plan meeting with the family permanent residency in this facility is possibly being considered. At this time he employs a wheelchair and has not had any additional falls here.  He requires moderate-max assistance for his ADLs.  He has a Foley and is frequently incontinent of bowel.  Therapy states that he can ambulate 120 feet with a rolling Keiffer and supervision.  He requires upper body supervision, lower body moderate assist and moderate assist with toileting.   He does have hyponatremia which has improved dramatically from a low of 121 on 7/4 to a value of 137 on 9/12.  Renal function has improved from a low CKD stage IIIb with values of 1.97/31 up to present high stage IIIb with a creatinine 1.5 to an EGFR of 42.   BCAT was performed 9/11 by Speech Therapy and revealed a score of 24 compatible with moderate dementia.  SLUMS MMSE had been performed on 9/5 and revealed a value of 8 out of 30 indicating severe  dementia.  Review of systems: Dementia invalidated responses.  When he was interviewed by the NP student he described some pain in the left lower extremity.  When I reviewed him shortly thereafter he denied any pain and in fact denied any active symptoms.  Despite the dramatic asymmetric edema he denied any paroxysmal nocturnal dyspnea or other cardiac issues. According to his son the asymmetric edema is a chronic issue. His son was not present at the time of my exam.  The patient states that his care providers approximately 10 to 87 years old and has no significant health issues.  Constitutional: No fever, significant weight change, fatigue  Eyes: No redness, discharge, pain, vision change ENT/mouth: No nasal congestion,  purulent discharge, earache, change in hearing, sore throat  Cardiovascular: No chest pain, palpitations, claudication  Respiratory: No cough, sputum production, hemoptysis, DOE, significant snoring, apnea   Gastrointestinal: No heartburn, dysphagia, abdominal pain, nausea /vomiting, rectal bleeding, melena, change in bowels Genitourinary: No hematuria, pyuria Musculoskeletal: No joint stiffness, joint swelling, weakness Dermatologic: No rash, pruritus Neurologic: No dizziness, headache, syncope, seizures, numbness, tingling Psychiatric: No significant anxiety, depression, insomnia, anorexia Endocrine: No change in hair/nails, excessive thirst, excessive hunger, excessive urination  Hematologic/lymphatic: No significant bruising, lymphadenopathy, abnormal bleeding Allergy/immunology: No itchy/watery eyes, significant sneezing, urticaria, angioedema  Physical exam:  Pertinent or positive findings: Affect is flat and answers are very terse.  There is minimal right exotropia intermittently.  Dental hygiene is surprisingly  good.  He has a brief systolic murmur at the base which appears to radiate into the right carotid artery.  There were minimal low-grade rales at the right  base.  Otherwise breath sounds are decreased.  Abdomen slightly protuberant.  Foley catheter is present.  Pedal pulses are not palpable.  There is visible asymmetric edema with the right lower extremity much larger than the left.  Denna Haggard' sign was negative.  The skin over the right lower extremity is dry.  Over the left mid medial calf he has a wound which is circular in size and at least 50 cent sized or greater.  He has extensive ecchymoses over the upper extremities especially over the forearms.  He has isolated contractures of his fingers.  General appearance: Adequately nourished; no acute distress, increased work of breathing is present.   Lymphatic: No lymphadenopathy about the head, neck, axilla. Eyes: No conjunctival inflammation or lid edema is present. There is no scleral icterus. Ears:  External ear exam shows no significant lesions or deformities.   Nose:  External nasal examination shows no deformity or inflammation. Nasal mucosa are pink and moist without lesions, exudates Oral exam:  Lips and gums are healthy appearing. There is no oropharyngeal erythema or exudate. Neck:  No thyromegaly, masses, tenderness noted.    Heart:  No gallop,click, rub .  Lungs: without wheezes, rhonchi, rubs. Abdomen: Bowel sounds are normal. Abdomen is soft and nontender with no organomegaly, hernias, masses. GU: Deferred  Extremities:  No cyanosis, clubbing Neurologic exam :Balance, Rhomberg, finger to nose testing could not be completed due to clinical state Skin: Warm & dry w/o tenting. No significant rash.  See summary under each active problem in the Problem List with associated updated therapeutic plan

## 2023-09-22 NOTE — Assessment & Plan Note (Signed)
Doxycycline 100 mg bid X 7 days. Wound Care Nurse to continue assessment & dressings.

## 2023-09-22 NOTE — Assessment & Plan Note (Addendum)
He has moderate-severe dementia as documented. Were he to return home he would require 24/7 supervision. His PCP should reassess him  within several weeks  if he returns home with feedback from family & any new care giver as to status & care needs.

## 2023-09-22 NOTE — Assessment & Plan Note (Addendum)
Over the last 3 months the nadir sodium was 121 on 07/01/2023.  Current sodium is normal at 137.  Periodic monitor indicated. Avoid excess fluid intake; restriction to 24 oz of water / day reasonable limit to prevent hyponatremia.

## 2023-09-22 NOTE — Assessment & Plan Note (Addendum)
Current CKD is high stage  high III B with a creatinine 1.52 and EGFR of 42.  Med list reviewed; no indication for change in meds or dosages at this time; but he is on low dose Spironolactone..  Continue to monitor.

## 2023-09-23 ENCOUNTER — Other Ambulatory Visit (HOSPITAL_COMMUNITY)
Admission: RE | Admit: 2023-09-23 | Discharge: 2023-09-23 | Disposition: A | Discharge status: Home / Self Care | Payer: Medicare Other | Source: Skilled Nursing Facility | Attending: Internal Medicine | Admitting: Internal Medicine

## 2023-09-23 LAB — D-DIMER, QUANTITATIVE: D-Dimer, Quant: 0.65 ug/mL-FEU — ABNORMAL HIGH (ref 0.00–0.50)

## 2023-09-30 ENCOUNTER — Ambulatory Visit: Payer: Medicare Other | Admitting: Urology

## 2023-10-04 ENCOUNTER — Non-Acute Institutional Stay (SKILLED_NURSING_FACILITY): Payer: Medicare Other | Admitting: Adult Health

## 2023-10-04 ENCOUNTER — Encounter: Payer: Self-pay | Admitting: Adult Health

## 2023-10-04 DIAGNOSIS — E222 Syndrome of inappropriate secretion of antidiuretic hormone: Secondary | ICD-10-CM | POA: Diagnosis not present

## 2023-10-04 DIAGNOSIS — I739 Peripheral vascular disease, unspecified: Secondary | ICD-10-CM

## 2023-10-04 DIAGNOSIS — I480 Paroxysmal atrial fibrillation: Secondary | ICD-10-CM | POA: Diagnosis not present

## 2023-10-04 NOTE — Progress Notes (Unsigned)
Location:  [facility] Room Number: 134 Place of Service:  SNF (31)   CODE STATUS: full   No Known Allergies  Chief Complaint  Patient presents with   Medical Management of Chronic Issues               SIADH (syndrome of inappropriate ADH production)/hyponatremia      Paroxsymal atrial fibrillation:     Peripheral vascular disease     HPI:  He is a 89 year old resident of this facility being seen for the management of his chronic illnesses: SIADH (syndrome of inappropriate ADH production)/hyponatremia      Paroxsymal atrial fibrillation:     Peripheral vascular disease. He continues with a fluid restriction his sodium level is stable. There are no reports of uncontrolled pain. His weight is without significant change.    Past Medical History:  Diagnosis Date   Coronary artery disease    Hyperlipidemia    Obesity    Penile cancer (HCC)    Swelling of extremity    Left Leg    Past Surgical History:  Procedure Laterality Date   APPENDECTOMY     BACK SURGERY     BIOPSY  07/22/2021   Procedure: BIOPSY;  Surgeon: Lynann Bologna, MD;  Location: [facility] ENDOSCOPY;  Service: Gastroenterology;;   CHOLECYSTECTOMY     CORONARY ANGIOPLASTY  11/2005   RCA PCI AND STENTING WITH A CYPHER DES   CORONARY ARTERY BYPASS GRAFT     ESOPHAGOGASTRODUODENOSCOPY (EGD) WITH PROPOFOL N/A 07/22/2021   Procedure: ESOPHAGOGASTRODUODENOSCOPY (EGD) WITH PROPOFOL;  Surgeon: Lynann Bologna, MD;  Location: [facility] ENDOSCOPY;  Service: Gastroenterology;  Laterality: N/A;   HIATAL HERNIA REPAIR     lymph node removal     NM MYOCAR MULTIPLE W/SPECT  11/26/2009   EF 62%. NORMAL MYOCARDIAL PERFUSION STUDY.   TRANSTHORACIC ECHOCARDIOGRAM  12/22/2005   MILD TO MOD AORTIC SCLEROSIS W/O STENOSIS. MILD TO MODERATE MITRAL CALCIFICATION. LA- MILDLY DILATED.    Social History   Socioeconomic History   Marital status: Divorced    Spouse name: Not on file   Number of children: Not on file   Years of  education: Not on file   Highest education level: Not on file  Occupational History   Occupation: retired    Associate Professor: RETIRED  Tobacco Use   Smoking status: Former    Current packs/day: 0.00    Types: Cigarettes    Quit date: 07/14/1983    Years since quitting: 40.2   Smokeless tobacco: Never  Vaping Use   Vaping status: Never Used  Substance and Sexual Activity   Alcohol use: No   Drug use: No   Sexual activity: Not on file  Other Topics Concern   Not on file  Social History Narrative   Not on file   Social Determinants of Health   Financial Resource Strain: Not on file  Food Insecurity: No Food Insecurity (08/29/2023)   Hunger Vital Sign    Worried About Running Out of Food in the Last Year: Never true    Ran Out of Food in the Last Year: Never true  Transportation Needs: No Transportation Needs (08/29/2023)   PRAPARE - Administrator, Civil Service (Medical): No    Lack of Transportation (Non-Medical): No  Physical Activity: Not on file  Stress: Not on file  Social Connections: Not on file  Intimate Partner Violence: Not At Risk (08/29/2023)   Humiliation, Afraid, Rape, and Kick questionnaire  Fear of Current or Ex-Partner: No    Emotionally Abused: No    Physically Abused: No    Sexually Abused: No   Family History  Problem Relation Age of Onset   Cancer Father    Cancer Sister    Cancer Brother    Cancer Sister    Cancer Other       VITAL SIGNS BP 108/62   Pulse 88   Temp 97.6 F (36.4 C)   Resp 18   Ht 5\' 10"  (1.778 m)   Wt 253 lb (114.8 kg)   SpO2 97%   BMI 36.30 kg/m   Outpatient Encounter Medications as of 10/04/2023  Medication Sig   AMBULATORY NON FORMULARY MEDICATION Take 1,500 mLs by mouth as needed. 1500 cc  fluid restriction   metoprolol tartrate (LOPRESSOR) 25 MG tablet Take 0.5 tablets (12.5 mg total) by mouth 2 (two) times daily.   Multiple Vitamins-Minerals (PRESERVISION AREDS 2) CAPS Take 1 tablet by mouth daily.    nystatin (MYCOSTATIN/NYSTOP) powder Apply topically 3 (three) times daily.   sennosides-docusate sodium (SENOKOT-S) 8.6-50 MG tablet Take 1 tablet by mouth daily.   sodium chloride 1 g tablet Take 1 tablet (1 g total) by mouth 2 (two) times daily with a meal.   spironolactone (ALDACTONE) 25 MG tablet Take 0.5 tablets (12.5 mg total) by mouth daily.   No facility-administered encounter medications on file as of 10/04/2023.     SIGNIFICANT DIAGNOSTIC EXAMS  08-29-23: wbc 7.8; hgb 11.0; hct 32.0; mcv 94.1 plt 184; glucose 127; bun 40; creat 1.67; k+ 2.7; na++ 122; ca 8.8; gfr 37; protein 6.5 albumin 3.8; mag 3.8; tsh 2.133 09-01-23: glucose 120; bun 43; creat 1.97; k+ 3.9; na++ 132; ca 8.9; gfr 31  TODAY  09-09-23: glucose 86; bun 37; creat 1.52; k+ 4.5; na++ 137; ca 8.8; gfr 42    Review of Systems  Constitutional:  Negative for malaise/fatigue.  Respiratory:  Negative for cough and shortness of breath.   Cardiovascular:  Negative for chest pain, palpitations and leg swelling.  Gastrointestinal:  Negative for abdominal pain, constipation and heartburn.  Musculoskeletal:  Negative for back pain, joint pain and myalgias.  Skin: Negative.   Neurological:  Negative for dizziness.  Psychiatric/Behavioral:  The patient is not nervous/anxious.     Physical Exam Constitutional:      General: He is not in acute distress.    Appearance: He is well-developed. He is not diaphoretic.  Neck:     Thyroid: No thyromegaly.  Cardiovascular:     Rate and Rhythm: Normal rate and regular rhythm.     Pulses: Normal pulses.     Heart sounds: Normal heart sounds.  Pulmonary:     Effort: Pulmonary effort is normal. No respiratory distress.     Breath sounds: Normal breath sounds.  Abdominal:     General: Bowel sounds are normal. There is no distension.     Palpations: Abdomen is soft.     Tenderness: There is no abdominal tenderness.  Genitourinary:    Comments: foley Musculoskeletal:        General:  Normal range of motion.     Cervical back: Neck supple.  Lymphadenopathy:     Cervical: No cervical adenopathy.  Skin:    General: Skin is warm and dry.  Neurological:     Mental Status: He is alert. Mental status is at baseline.     Comments: 09-02-23: SLUMS 8/30  Psychiatric:        Mood  and Affect: Mood normal.     ASSESSMENT/ PLAN:  TODAY  SIADH (syndrome of inappropriate ADH production)/hyponatremia na++ 137; is on NACL 1 gm twice daily; and is on fluid restriction  2. Paroxsymal atrial fibrillation: heart rate is stable will continue lopressor 12.5 mg twice daily   3. Peripheral vascular disease: will monitor  PREVIOUS   4. Neurocognitive deficits: ct of 08-29-23 demonstrates: Atrophy with chronic small vessel white matter ischemic disease.   5. Stage 3b chronic kidney disease (CKD): bun 37; creat 1.52 gfr 42  6. History of penile cancer/neurogenic bladder: has long term foley  7. Chronic constipation: will continue miralax daily   8. Coronary artery disease involving native coronary artery of native heart without angina pectoris.   9. Aortic calcification: (Ct 02-17-16)    Synthia Innocent NP [facility]  call [phone]

## 2023-10-05 DIAGNOSIS — Z23 Encounter for immunization: Secondary | ICD-10-CM | POA: Diagnosis not present

## 2023-10-09 ENCOUNTER — Encounter: Payer: Self-pay | Admitting: Adult Health

## 2023-10-12 ENCOUNTER — Ambulatory Visit (HOSPITAL_COMMUNITY)
Admission: RE | Admit: 2023-10-12 | Discharge: 2023-10-12 | Disposition: A | Discharge status: Home / Self Care | Payer: Medicare Other | Source: Ambulatory Visit | Attending: Internal Medicine | Admitting: Internal Medicine

## 2023-10-12 ENCOUNTER — Other Ambulatory Visit (HOSPITAL_COMMUNITY): Payer: Self-pay | Admitting: Internal Medicine

## 2023-10-12 ENCOUNTER — Non-Acute Institutional Stay (SKILLED_NURSING_FACILITY): Payer: Medicare Other | Admitting: Internal Medicine

## 2023-10-12 ENCOUNTER — Encounter: Payer: Self-pay | Admitting: Internal Medicine

## 2023-10-12 ENCOUNTER — Other Ambulatory Visit (HOSPITAL_COMMUNITY)
Admission: RE | Admit: 2023-10-12 | Discharge: 2023-10-12 | Disposition: A | Discharge status: Home / Self Care | Payer: Medicare Other | Source: Skilled Nursing Facility | Attending: Internal Medicine | Admitting: Internal Medicine

## 2023-10-12 DIAGNOSIS — L03115 Cellulitis of right lower limb: Secondary | ICD-10-CM | POA: Diagnosis not present

## 2023-10-12 DIAGNOSIS — R600 Localized edema: Secondary | ICD-10-CM | POA: Diagnosis not present

## 2023-10-12 DIAGNOSIS — F5089 Other specified eating disorder: Secondary | ICD-10-CM | POA: Diagnosis not present

## 2023-10-12 DIAGNOSIS — R6889 Other general symptoms and signs: Secondary | ICD-10-CM

## 2023-10-12 DIAGNOSIS — N1832 Chronic kidney disease, stage 3b: Secondary | ICD-10-CM | POA: Insufficient documentation

## 2023-10-12 DIAGNOSIS — I872 Venous insufficiency (chronic) (peripheral): Secondary | ICD-10-CM | POA: Diagnosis not present

## 2023-10-12 HISTORY — DX: Cellulitis of right lower limb: L03.115

## 2023-10-12 LAB — CBC
HCT: 32.5 % — ABNORMAL LOW (ref 39.0–52.0)
Hemoglobin: 10.2 g/dL — ABNORMAL LOW (ref 13.0–17.0)
MCH: 32.1 pg (ref 26.0–34.0)
MCHC: 31.4 g/dL (ref 30.0–36.0)
MCV: 102.2 fL — ABNORMAL HIGH (ref 80.0–100.0)
Platelets: 171 10*3/uL (ref 150–400)
RBC: 3.18 MIL/uL — ABNORMAL LOW (ref 4.22–5.81)
RDW: 15.4 % (ref 11.5–15.5)
WBC: 6.7 10*3/uL (ref 4.0–10.5)
nRBC: 0 % (ref 0.0–0.2)

## 2023-10-12 LAB — BASIC METABOLIC PANEL
Anion gap: 7 (ref 5–15)
BUN: 26 mg/dL — ABNORMAL HIGH (ref 8–23)
CO2: 22 mmol/L (ref 22–32)
Calcium: 9 mg/dL (ref 8.9–10.3)
Chloride: 107 mmol/L (ref 98–111)
Creatinine, Ser: 1.45 mg/dL — ABNORMAL HIGH (ref 0.61–1.24)
GFR, Estimated: 44 mL/min — ABNORMAL LOW (ref >60)
Glucose, Bld: 84 mg/dL (ref 70–99)
Potassium: 4.7 mmol/L (ref 3.5–5.1)
Sodium: 136 mmol/L (ref 135–145)

## 2023-10-12 LAB — D-DIMER, QUANTITATIVE: D-Dimer, Quant: 0.71 ug{FEU}/mL — ABNORMAL HIGH (ref 0.00–0.50)

## 2023-10-12 NOTE — Progress Notes (Unsigned)
NURSING HOME LOCATION:  [facility] ROOM NUMBER:  [id number] W  CODE STATUS: DNR   PCP:  [name] MD  This is a nursing facility follow up visit for specific acute issue of RLE edema.  Interim medical record and care since last SNF visit was updated with review of diagnostic studies and change in clinical status since last visit were documented.  HPI:His Nurse has noted increased swelling of the right lower extremity over the last 24 hours.  He also has a LLE hematoma which has opened ; it is wrapped & monitored by the Wound Care Nurse. He states that the right lower extremity "always has been bigger than the left."  He states that this has been the case for at least 5 years.   He denies any active cardiopulmonary symptoms.  He has gained 12 pounds over the past month.   His major concern is his feet are cold.  He questions whether it might be related to his eating ice , a habit of 10 years.He also has no bleeding dyscrasias despite the pagophagia. He questions using a heating pad for the cold feet.TSH was therapeutic @ 2.133 on 9/1.  Also at that time H/H was 11/32. D-dimer was mildly elevated at 0.65 when checked on 9/26.  Review of systems: Constitutional: No fever, fatigue  Eyes: No redness, discharge, pain, vision change ENT/mouth: No nasal congestion,  purulent discharge, earache, change in hearing, sore throat  Cardiovascular: No chest pain, palpitations, paroxysmal nocturnal dyspnea  Respiratory: No cough, sputum production, hemoptysis, DOE, significant snoring, apnea   Gastrointestinal: No heartburn, dysphagia, abdominal pain, nausea /vomiting, rectal bleeding, melena, change in bowels Genitourinary: No dysuria, hematuria, pyuria, incontinence, nocturia Musculoskeletal: No joint stiffness, joint swelling, weakness, pain Dermatologic: No rash, pruritus, change in appearance of skin Neurologic: No dizziness, headache, syncope, seizures, numbness,  tingling Psychiatric: No significant anxiety, depression, insomnia, anorexia Endocrine: No change in hair/skin/nails, excessive thirst, excessive hunger, excessive urination  Hematologic/lymphatic: No significant bruising, lymphadenopathy, abnormal bleeding  Physical exam:  Pertinent or positive findings: He is very stoic.  Facies tend to be blank.  A 1.5 systolic murmur is present.  No abnormal breath sounds are noted.  Abdomen is protuberant.  There is a well-healed abdominal operative scar.  The left lower extremity is wrapped.  There is bland hyperpigmentation of the left shin.  The right lower extremity is massive below the knee.  There is a grayish pigmentation over the distal shin.  The superior edge of this hyperpigmentation is associated with irregular erythematous changes suggesting cellulitis.  This is nonblanching.  Pedal pulses are not palpable.  He has 1.5+ pitting edema of the RLE. The forearms reveal scattered vitiligo, scarring and keratotic lesions. Finger contractures present.  General appearance: Adequately nourished; no acute distress, increased work of breathing is present.   Lymphatic: No lymphadenopathy about the head, neck, axilla. Eyes: No conjunctival inflammation or lid edema is present. There is no scleral icterus. Ears:  External ear exam shows no significant lesions or deformities.   Nose:  External nasal examination shows no deformity or inflammation. Nasal mucosa are pink and moist without lesions, exudates Oral exam:  Lips and gums are healthy appearing. There is no oropharyngeal erythema or exudate. Neck:  No thyromegaly, masses, tenderness noted.    Heart:  Normal rate and regular rhythm. S1 and S2 normal without gallop, murmur, click, rub .  Lungs: Chest clear to auscultation without wheezes, rhonchi, rales, rubs. Abdomen: Bowel sounds are  normal. Abdomen is soft and nontender with no organomegaly, hernias, masses. GU: Deferred  Extremities:  No cyanosis,  clubbing, edema  Neurologic exam : Cn 2-7 intact Strength equal  in upper & lower extremities Balance, Rhomberg, finger to nose testing could not be completed due to clinical state Deep tendon reflexes are equal Skin: Warm & dry w/o tenting. No significant lesions or rash.  See summary under each active problem in the Problem List with associated updated therapeutic plan

## 2023-10-12 NOTE — Assessment & Plan Note (Signed)
Doxycycline 100 mg twice daily

## 2023-10-12 NOTE — Assessment & Plan Note (Signed)
There is dramatic asymmetry to the edema with the right calf being massive.  There is also suggestion of associated cellulitis.  Doxycycline has been initiated.  D-dimer, venous Doppler, initiation of Eliquis pending results of Doppler.

## 2023-10-12 NOTE — Patient Instructions (Signed)
See assessment and plan under each diagnosis in the problem list and acutely for this visit 

## 2023-10-18 ENCOUNTER — Encounter: Payer: Self-pay | Admitting: Adult Health

## 2023-10-18 ENCOUNTER — Non-Acute Institutional Stay (SKILLED_NURSING_FACILITY): Payer: Self-pay | Admitting: Adult Health

## 2023-10-18 DIAGNOSIS — I480 Paroxysmal atrial fibrillation: Secondary | ICD-10-CM | POA: Diagnosis not present

## 2023-10-18 DIAGNOSIS — Z23 Encounter for immunization: Secondary | ICD-10-CM | POA: Diagnosis not present

## 2023-10-18 NOTE — Progress Notes (Unsigned)
Location:  [facility] Room Number: 139 Place of Service:  SNF ([id number])   CODE STATUS: full   No Known Allergies  Chief Complaint  Patient presents with   Acute Visit    Blood pressure     HPI:  His blood pressure readings have been soft. He is presently taking metoprolol  12.5 mg twice daily; spironolactone 12.5 mg. He is on a fluid restriction. He is presently taking doxycycline for left calf cellulitis. There are no reports of fevers present.   Past Medical History:  Diagnosis Date   Coronary artery disease    Hyperlipidemia    Obesity    Penile cancer (HCC)    Swelling of extremity    Left Leg    Past Surgical History:  Procedure Laterality Date   APPENDECTOMY     BACK SURGERY     BIOPSY  07/22/2021   Procedure: BIOPSY;  Surgeon: Lynann Bologna, MD;  Location: [facility] ENDOSCOPY;  Service: Gastroenterology;;   CHOLECYSTECTOMY     CORONARY ANGIOPLASTY  11/2005   RCA PCI AND STENTING WITH A CYPHER DES   CORONARY ARTERY BYPASS GRAFT     ESOPHAGOGASTRODUODENOSCOPY (EGD) WITH PROPOFOL N/A 07/22/2021   Procedure: ESOPHAGOGASTRODUODENOSCOPY (EGD) WITH PROPOFOL;  Surgeon: Lynann Bologna, MD;  Location: [facility] ENDOSCOPY;  Service: Gastroenterology;  Laterality: N/A;   HIATAL HERNIA REPAIR     lymph node removal     NM MYOCAR MULTIPLE W/SPECT  11/26/2009   EF 62%. NORMAL MYOCARDIAL PERFUSION STUDY.   TRANSTHORACIC ECHOCARDIOGRAM  12/22/2005   MILD TO MOD AORTIC SCLEROSIS W/O STENOSIS. MILD TO MODERATE MITRAL CALCIFICATION. LA- MILDLY DILATED.    Social History   Socioeconomic History   Marital status: Divorced    Spouse name: Not on file   Number of children: Not on file   Years of education: Not on file   Highest education level: Not on file  Occupational History   Occupation: retired    Associate Professor: RETIRED  Tobacco Use   Smoking status: Former    Current packs/day: 0.00    Types: Cigarettes    Quit date: 07/14/1983    Years since quitting: 40.2    Smokeless tobacco: Never  Vaping Use   Vaping status: Never Used  Substance and Sexual Activity   Alcohol use: No   Drug use: No   Sexual activity: Not on file  Other Topics Concern   Not on file  Social History Narrative   Not on file   Social Determinants of Health   Financial Resource Strain: Not on file  Food Insecurity: No Food Insecurity (08/29/2023)   Hunger Vital Sign    Worried About Running Out of Food in the Last Year: Never true    Ran Out of Food in the Last Year: Never true  Transportation Needs: No Transportation Needs (08/29/2023)   PRAPARE - Administrator, Civil Service (Medical): No    Lack of Transportation (Non-Medical): No  Physical Activity: Not on file  Stress: Not on file  Social Connections: Not on file  Intimate Partner Violence: Not At Risk (08/29/2023)   Humiliation, Afraid, Rape, and Kick questionnaire    Fear of Current or Ex-Partner: No    Emotionally Abused: No    Physically Abused: No    Sexually Abused: No   Family History  Problem Relation Age of Onset   Cancer Father    Cancer Sister    Cancer Brother    Cancer Sister  Cancer Other       VITAL SIGNS BP 108/70   Pulse 74   Temp (!) 97.4 F (36.3 C)   Resp 18   Ht 5\' 10"  (1.778 m)   Wt 248 lb 9.6 oz (112.8 kg)   SpO2 97%   BMI 35.67 kg/m   Outpatient Encounter Medications as of 10/18/2023  Medication Sig   AMBULATORY NON FORMULARY MEDICATION Take 1,500 mLs by mouth as needed. 1500 cc  fluid restriction   metoprolol tartrate (LOPRESSOR) 25 MG tablet Take 0.5 tablets (12.5 mg total) by mouth 2 (two) times daily.   Multiple Vitamins-Minerals (PRESERVISION AREDS 2) CAPS Take 1 tablet by mouth daily.   nystatin (MYCOSTATIN/NYSTOP) powder Apply topically 3 (three) times daily.   sennosides-docusate sodium (SENOKOT-S) 8.6-50 MG tablet Take 1 tablet by mouth daily.   sodium chloride 1 g tablet Take 1 tablet (1 g total) by mouth 2 (two) times daily with a meal.    spironolactone (ALDACTONE) 25 MG tablet Take 0.5 tablets (12.5 mg total) by mouth daily.   No facility-administered encounter medications on file as of 10/18/2023.     SIGNIFICANT DIAGNOSTIC EXAMS  08-29-23: wbc 7.8; hgb 11.0; hct 32.0; mcv 94.1 plt 184; glucose 127; bun 40; creat 1.67; k+ 2.7; na++ 122; ca 8.8; gfr 37; protein 6.5 albumin 3.8; mag 3.8; tsh 2.133 09-01-23: glucose 120; bun 43; creat 1.97; k+ 3.9; na++ 132; ca 8.9; gfr 31 09-09-23: glucose 86; bun 37; creat 1.52; k+ 4.5; na++ 137; ca 8.8; gfr 42  NO NEW LABS.     Review of Systems  Constitutional:  Negative for malaise/fatigue.  Respiratory:  Negative for cough and shortness of breath.   Cardiovascular:  Negative for chest pain, palpitations and leg swelling.  Gastrointestinal:  Negative for abdominal pain, constipation and heartburn.  Musculoskeletal:  Negative for back pain, joint pain and myalgias.  Skin: Negative.   Neurological:  Negative for dizziness.  Psychiatric/Behavioral:  The patient is not nervous/anxious.    Physical Exam Constitutional:      General: He is not in acute distress.    Appearance: He is well-developed. He is not diaphoretic.  Neck:     Thyroid: No thyromegaly.  Cardiovascular:     Rate and Rhythm: Normal rate and regular rhythm.     Pulses: Normal pulses.     Heart sounds: Normal heart sounds.  Pulmonary:     Effort: Pulmonary effort is normal. No respiratory distress.     Breath sounds: Normal breath sounds.  Abdominal:     General: Bowel sounds are normal. There is no distension.     Palpations: Abdomen is soft.     Tenderness: There is no abdominal tenderness.  Genitourinary:    Comments: foley Musculoskeletal:        General: Normal range of motion.     Cervical back: Neck supple.  Lymphadenopathy:     Cervical: No cervical adenopathy.  Skin:    General: Skin is warm and dry.     Comments: Left calf ulceration with drainage present   Neurological:     Mental Status: He is  alert.     Comments: 09-02-23: SLUMS 8/30   Psychiatric:        Mood and Affect: Mood normal.        ASSESSMENT/ PLAN:  TODAY  Paroxsymal atrial fibrillation: he takes metoprolol rate rate control. There are no reports of tachycardia present will lower to 12.5 mg daily for one week then stop. If  he develops tachycardia will consider cardizem at that time.        Synthia Innocent NP [facility]  call [phone]

## 2023-10-21 ENCOUNTER — Encounter: Payer: Self-pay | Admitting: Adult Health

## 2023-10-21 ENCOUNTER — Non-Acute Institutional Stay (SKILLED_NURSING_FACILITY): Payer: Medicare Other | Admitting: Adult Health

## 2023-10-21 DIAGNOSIS — Z0000 Encounter for general adult medical examination without abnormal findings: Secondary | ICD-10-CM

## 2023-10-21 NOTE — Progress Notes (Signed)
Subjective:   Caleb [name] is a 89 y.o. male who presents for Medicare Annual/Subsequent preventive examination.  Visit Complete: In person  Patient Medicare AWV questionnaire was completed by the patient on 10-21-23; I have confirmed that all information answered by patient is correct and no changes since this date.  Cardiac Risk Factors include: advanced age (>72men, >34 women);male gender;obesity (BMI >30kg/m2);sedentary lifestyle     Objective:    Today's Vitals   10/21/23 1208  BP: 108/70  Pulse: 74  Resp: 18  Temp: (!) 97.4 F (36.3 C)  SpO2: 97%  Weight: 248 lb 9.6 oz (112.8 kg)  Height: 5\' 10"  (1.778 m)   Body mass index is 35.67 kg/m.     09/17/2023   10:13 AM 09/16/2023    4:20 PM 08/29/2023    3:00 PM 08/29/2023   11:25 AM 07/16/2023    9:18 PM 07/02/2023   12:25 PM 07/01/2023    6:36 PM  Advanced Directives  Does Patient Have a Medical Advance Directive? No No No No Yes  No  Type of Agricultural consultant;Living will    Does patient want to make changes to medical advance directive?     No - Patient declined    Copy of Healthcare Power of Attorney in Chart?     No - copy requested, Physician notified    Would patient like information on creating a medical advance directive?   No - Patient declined  No - Patient declined No - Patient declined     Current Medications (verified) Outpatient Encounter Medications as of 10/21/2023  Medication Sig   AMBULATORY NON FORMULARY MEDICATION Take 1,500 mLs by mouth as needed. 1500 cc  fluid restriction   metoprolol tartrate (LOPRESSOR) 25 MG tablet Take 0.5 tablets (12.5 mg total) by mouth 2 (two) times daily.   Multiple Vitamins-Minerals (PRESERVISION AREDS 2) CAPS Take 1 tablet by mouth daily.   nystatin (MYCOSTATIN/NYSTOP) powder Apply topically 3 (three) times daily.   sennosides-docusate sodium (SENOKOT-S) 8.6-50 MG tablet Take 1 tablet by mouth daily.   sodium chloride 1 g tablet Take 1  tablet (1 g total) by mouth 2 (two) times daily with a meal.   spironolactone (ALDACTONE) 25 MG tablet Take 0.5 tablets (12.5 mg total) by mouth daily.   No facility-administered encounter medications on file as of 10/21/2023.    Allergies (verified) Patient has no known allergies.   History: Past Medical History:  Diagnosis Date   Coronary artery disease    Hyperlipidemia    Obesity    Penile cancer (HCC)    Swelling of extremity    Left Leg   Past Surgical History:  Procedure Laterality Date   APPENDECTOMY     BACK SURGERY     BIOPSY  07/22/2021   Procedure: BIOPSY;  Surgeon: Lynann Bologna, MD;  Location: [facility] ENDOSCOPY;  Service: Gastroenterology;;   CHOLECYSTECTOMY     CORONARY ANGIOPLASTY  11/2005   RCA PCI AND STENTING WITH A CYPHER DES   CORONARY ARTERY BYPASS GRAFT     ESOPHAGOGASTRODUODENOSCOPY (EGD) WITH PROPOFOL N/A 07/22/2021   Procedure: ESOPHAGOGASTRODUODENOSCOPY (EGD) WITH PROPOFOL;  Surgeon: Lynann Bologna, MD;  Location: [facility] ENDOSCOPY;  Service: Gastroenterology;  Laterality: N/A;   HIATAL HERNIA REPAIR     lymph node removal     NM MYOCAR MULTIPLE W/SPECT  11/26/2009   EF 62%. NORMAL MYOCARDIAL PERFUSION STUDY.   TRANSTHORACIC ECHOCARDIOGRAM  12/22/2005   MILD TO MOD  AORTIC SCLEROSIS W/O STENOSIS. MILD TO MODERATE MITRAL CALCIFICATION. LA- MILDLY DILATED.   Family History  Problem Relation Age of Onset   Cancer Father    Cancer Sister    Cancer Brother    Cancer Sister    Cancer Other    Social History   Socioeconomic History   Marital status: Divorced    Spouse name: Not on file   Number of children: Not on file   Years of education: Not on file   Highest education level: Not on file  Occupational History   Occupation: retired    Associate Professor: RETIRED  Tobacco Use   Smoking status: Former    Current packs/day: 0.00    Types: Cigarettes    Quit date: 07/14/1983    Years since quitting: 40.2   Smokeless tobacco: Never  Vaping Use   Vaping status:  Never Used  Substance and Sexual Activity   Alcohol use: No   Drug use: No   Sexual activity: Not on file  Other Topics Concern   Not on file  Social History Narrative   Not on file   Social Determinants of Health   Financial Resource Strain: Not on file  Food Insecurity: No Food Insecurity (08/29/2023)   Hunger Vital Sign    Worried About Running Out of Food in the Last Year: Never true    Ran Out of Food in the Last Year: Never true  Transportation Needs: No Transportation Needs (08/29/2023)   PRAPARE - Administrator, Civil Service (Medical): No    Lack of Transportation (Non-Medical): No  Physical Activity: Not on file  Stress: Not on file  Social Connections: Not on file    Tobacco Counseling Counseling given: Not Answered   Clinical Intake:  Pre-visit preparation completed: Yes  Pain : No/denies pain     BMI - recorded: 35.67 Nutritional Status: BMI > 30  Obese Nutritional Risks: Unintentional weight loss Diabetes: No  How often do you need to have someone help you when you read instructions, pamphlets, or other written materials from your doctor or pharmacy?: 5 - Always  Interpreter Needed?: No      Activities of Daily Living    10/21/2023   12:19 PM 08/29/2023    3:00 PM  In your present state of health, do you have any difficulty performing the following activities:  Hearing? 0 0  Vision? 0 0  Difficulty concentrating or making decisions? 1 0  Walking or climbing stairs? 1 1  Dressing or bathing? 1 1  Doing errands, shopping? 1 1  Preparing Food and eating ? Y   Using the Toilet? Y   In the past six months, have you accidently leaked urine? Y   Do you have problems with loss of bowel control? Y   Managing your Medications? Y   Managing your Finances? Y   Housekeeping or managing your Housekeeping? Y     Patient Care Team: [name], MD as PCP - General (Internal Medicine)  Indicate any recent Medical Services you may have  received from other than [facility] providers in the past year (date may be approximate).     Assessment:   This is a routine wellness examination for Caleb [name].  Hearing/Vision screen No results found.   Goals Addressed             This Visit's Progress    Absence of Fall and Fall-Related Injury       Evidence-based guidance:  Assess fall risk  using a validated tool when available. Consider balance and gait impairment, muscle weakness, diminished vision or hearing, environmental hazards, presence of urinary or bowel urgency and/or incontinence.  Communicate fall injury risk to interprofessional healthcare team.  Develop a fall prevention plan with the patient and family.  Promote use of personal vision and auditory aids.  Promote reorientation, appropriate sensory stimulation, and routines to decrease risk of fall when changes in mental status are present.  Assess assistance level required for safe and effective self-care; consider referral for home care.  Encourage physical activity, such as performance of self-care at highest level of ability, strength and balance exercise program, and provision of appropriate assistive devices; refer to rehabilitation therapy.  Refer to community-based fall prevention program where available.  If fall occurs, determine the cause and revise fall injury prevention plan.  Regularly review medication contribution to fall risk; consider risk related to polypharmacy and age.  Refer to pharmacist for consultation when concerns about medications are revealed.  Balance adequate pain management with potential for oversedation.  Provide guidance related to environmental modifications.  Consider supplementation with Vitamin D.   Notes:      General - Client will not be readmitted within 30 days (C-[facility])         Depression Screen    10/21/2023   12:21 PM  PHQ 2/9 Scores  PHQ - 2 Score 0    Fall Risk    10/21/2023   12:20 PM  Fall Risk   Falls in the  past year? 1  Number falls in past yr: 1  Injury with Fall? 0  Risk for fall due to : History of fall(s);Impaired balance/gait;Impaired mobility  Follow up Falls evaluation completed    MEDICARE RISK AT HOME: Medicare Risk at Home Any stairs in or around the home?: Yes If so, are there any without handrails?: No Home free of loose throw rugs in walkways, pet beds, electrical cords, etc?: Yes Adequate lighting in your home to reduce risk of falls?: Yes Life alert?: No Use of a cane, Yoshino or w/c?: No Grab bars in the bathroom?: Yes Shower chair or bench in shower?: Yes Elevated toilet seat or a handicapped toilet?: Yes  TIMED UP AND GO:  Was the test performed?  No    Cognitive Function:    10/21/2023   12:21 PM  MMSE - Mini Mental State Exam  Not completed: Unable to complete        Immunizations Immunization History  Administered Date(s) Administered   H1N1 10/30/2015   Influenza, High Dose Seasonal PF 10/16/2020   Influenza-Unspecified 10/28/2014, 09/27/2018, 11/17/2022   Moderna Sars-Covid-2 Vaccination 01/09/2020, 02/09/2020, 12/12/2020, 11/13/2022, 10/05/2023   PNEUMOCOCCAL CONJUGATE-20 09/22/2023   Pneumococcal Polysaccharide-23 11/15/1995, 10/19/2006   Tdap 01/12/2016    TDAP status: Up to date  Flu Vaccine status: Up to date  Pneumococcal vaccine status: Up to date  Covid-19 vaccine status: Completed vaccines  Qualifies for Shingles Vaccine? Yes   Zostavax completed Yes   Shingrix Completed?: No.    Education has been provided regarding the importance of this vaccine. Patient has been advised to call insurance company to determine out of pocket expense if they have not yet received this vaccine. Advised may also receive vaccine at local pharmacy or Health Dept. Verbalized acceptance and understanding.  Screening Tests Health Maintenance  Topic Date Due   Zoster Vaccines- Shingrix (1 of 2) Never done   INFLUENZA VACCINE  07/29/2023   COVID-19  Vaccine (6 - 2023-24 season)  11/30/2023   Medicare Annual Wellness (AWV)  04/22/2024   DTaP/Tdap/Td (2 - Td or Tdap) 01/11/2026   Pneumonia Vaccine 38+ Years old  Completed   HPV VACCINES  Aged Out    Health Maintenance  Health Maintenance Due  Topic Date Due   Zoster Vaccines- Shingrix (1 of 2) Never done   INFLUENZA VACCINE  07/29/2023    Colorectal cancer screening: No longer required.   Lung Cancer Screening: (Low Dose CT Chest recommended if Age 19-80 years, 20 pack-year currently smoking OR have quit w/in 15years.) does not qualify.   Lung Cancer Screening Referral:   Additional Screening:  Hepatitis C Screening: does not qualify; Completed   Vision Screening: Recommended annual ophthalmology exams for early detection of glaucoma and other disorders of the eye. Is the patient up to date with their annual eye exam?  No  Who is the provider or what is the name of the office in which the patient attends annual eye exams?  If pt is not established with a provider, would they like to be referred to a provider to establish care? No .   Dental Screening: Recommended annual dental exams for proper oral hygiene  Diabetic Foot Exam:   Community Resource Referral / Chronic Care Management: CRR required this visit?  No   CCM required this visit?  No     Plan:     I have personally reviewed and noted the following in the patient's chart:   Medical and social history Use of alcohol, tobacco or illicit drugs  Current medications and supplements including opioid prescriptions. Patient is not currently taking opioid prescriptions. Functional ability and status Nutritional status Physical activity Advanced directives List of other physicians Hospitalizations, surgeries, and ER visits in previous 12 months Vitals Screenings to include cognitive, depression, and falls Referrals and appointments  In addition, I have reviewed and discussed with patient certain preventive  protocols, quality metrics, and best practice recommendations. A written personalized care plan for preventive services as well as general preventive health recommendations were provided to patient.     Sharee Holster, NP   10/21/2023   After Visit Summary: (In Person-Declined) Patient declined AVS at this time.  Nurse Notes: this exam was performed by myself at this facility

## 2023-10-24 ENCOUNTER — Encounter: Payer: Self-pay | Admitting: Adult Health

## 2023-11-05 DIAGNOSIS — L84 Corns and callosities: Secondary | ICD-10-CM | POA: Diagnosis not present

## 2023-11-05 DIAGNOSIS — L602 Onychogryphosis: Secondary | ICD-10-CM | POA: Diagnosis not present

## 2023-11-05 DIAGNOSIS — L603 Nail dystrophy: Secondary | ICD-10-CM | POA: Diagnosis not present

## 2023-11-05 DIAGNOSIS — I739 Peripheral vascular disease, unspecified: Secondary | ICD-10-CM | POA: Diagnosis not present

## 2023-11-09 ENCOUNTER — Encounter: Payer: Self-pay | Admitting: Adult Health

## 2023-11-09 ENCOUNTER — Non-Acute Institutional Stay (SKILLED_NURSING_FACILITY): Payer: Medicare Other | Admitting: Adult Health

## 2023-11-09 DIAGNOSIS — F0150 Vascular dementia without behavioral disturbance: Secondary | ICD-10-CM

## 2023-11-09 DIAGNOSIS — R4189 Other symptoms and signs involving cognitive functions and awareness: Secondary | ICD-10-CM

## 2023-11-09 DIAGNOSIS — N1832 Chronic kidney disease, stage 3b: Secondary | ICD-10-CM | POA: Diagnosis not present

## 2023-11-09 DIAGNOSIS — Z8549 Personal history of malignant neoplasm of other male genital organs: Secondary | ICD-10-CM | POA: Diagnosis not present

## 2023-11-09 DIAGNOSIS — N319 Neuromuscular dysfunction of bladder, unspecified: Secondary | ICD-10-CM

## 2023-11-09 DIAGNOSIS — R29818 Other symptoms and signs involving the nervous system: Secondary | ICD-10-CM

## 2023-11-09 NOTE — Progress Notes (Unsigned)
Location:  [facility] Room Number: 139 Place of Service:  SNF (31)   CODE STATUS: full   No Known Allergies  Chief Complaint  Patient presents with   Medical Management of Chronic Issues         Neurocognitive deficits/vascular dementia without behavioral disturbance:    Stage 3b chronic  kidney disease (CKD):    History of penile cancer/neurogenic bladder     HPI:  He is a 89 year old long term resident of this facility being seen for the management of his chronic illnesses:  Neurocognitive deficits/vascular dementia without behavioral disturbance:    Stage 3b chronic  kidney disease (CKD):    History of penile cancer/neurogenic bladder. There are no reports of uncontrolled pain. He has bilateral lower extremity edema. His weight is without significant change.   Past Medical History:  Diagnosis Date   Coronary artery disease    Hyperlipidemia    Obesity    Penile cancer (HCC)    Swelling of extremity    Left Leg    Past Surgical History:  Procedure Laterality Date   APPENDECTOMY     BACK SURGERY     BIOPSY  07/22/2021   Procedure: BIOPSY;  Surgeon: Lynann Bologna, MD;  Location: [facility] ENDOSCOPY;  Service: Gastroenterology;;   CHOLECYSTECTOMY     CORONARY ANGIOPLASTY  11/2005   RCA PCI AND STENTING WITH A CYPHER DES   CORONARY ARTERY BYPASS GRAFT     ESOPHAGOGASTRODUODENOSCOPY (EGD) WITH PROPOFOL N/A 07/22/2021   Procedure: ESOPHAGOGASTRODUODENOSCOPY (EGD) WITH PROPOFOL;  Surgeon: Lynann Bologna, MD;  Location: [facility] ENDOSCOPY;  Service: Gastroenterology;  Laterality: N/A;   HIATAL HERNIA REPAIR     lymph node removal     NM MYOCAR MULTIPLE W/SPECT  11/26/2009   EF 62%. NORMAL MYOCARDIAL PERFUSION STUDY.   TRANSTHORACIC ECHOCARDIOGRAM  12/22/2005   MILD TO MOD AORTIC SCLEROSIS W/O STENOSIS. MILD TO MODERATE MITRAL CALCIFICATION. LA- MILDLY DILATED.    Social History   Socioeconomic History   Marital status: Divorced    Spouse name: Not on file    Number of children: Not on file   Years of education: Not on file   Highest education level: Not on file  Occupational History   Occupation: retired    Associate Professor: RETIRED  Tobacco Use   Smoking status: Former    Current packs/day: 0.00    Types: Cigarettes    Quit date: 07/14/1983    Years since quitting: 40.3   Smokeless tobacco: Never  Vaping Use   Vaping status: Never Used  Substance and Sexual Activity   Alcohol use: No   Drug use: No   Sexual activity: Not on file  Other Topics Concern   Not on file  Social History Narrative   Not on file   Social Determinants of Health   Financial Resource Strain: Not on file  Food Insecurity: No Food Insecurity (08/29/2023)   Hunger Vital Sign    Worried About Running Out of Food in the Last Year: Never true    Ran Out of Food in the Last Year: Never true  Transportation Needs: No Transportation Needs (08/29/2023)   PRAPARE - Administrator, Civil Service (Medical): No    Lack of Transportation (Non-Medical): No  Physical Activity: Not on file  Stress: Not on file  Social Connections: Not on file  Intimate Partner Violence: Not At Risk (08/29/2023)   Humiliation, Afraid, Rape, and Kick questionnaire    Fear  of Current or Ex-Partner: No    Emotionally Abused: No    Physically Abused: No    Sexually Abused: No   Family History  Problem Relation Age of Onset   Cancer Father    Cancer Sister    Cancer Brother    Cancer Sister    Cancer Other       VITAL SIGNS BP 118/84   Pulse 79   Temp 97.9 F (36.6 C)   Resp 20   Ht 5\' 10"  (1.778 m)   Wt 252 lb (114.3 kg)   SpO2 97%   BMI 36.16 kg/m   Outpatient Encounter Medications as of 11/09/2023  Medication Sig   AMBULATORY NON FORMULARY MEDICATION Take 1,500 mLs by mouth as needed. 1500 cc  fluid restriction   Multiple Vitamins-Minerals (PRESERVISION AREDS 2) CAPS Take 1 tablet by mouth daily.   nystatin (MYCOSTATIN/NYSTOP) powder Apply topically 3 (three) times  daily.   sennosides-docusate sodium (SENOKOT-S) 8.6-50 MG tablet Take 1 tablet by mouth daily.   sodium chloride 1 g tablet Take 1 tablet (1 g total) by mouth 2 (two) times daily with a meal.   spironolactone (ALDACTONE) 25 MG tablet Take 0.5 tablets (12.5 mg total) by mouth daily.   [DISCONTINUED] metoprolol tartrate (LOPRESSOR) 25 MG tablet Take 0.5 tablets (12.5 mg total) by mouth 2 (two) times daily.   No facility-administered encounter medications on file as of 11/09/2023.     SIGNIFICANT DIAGNOSTIC EXAMS   08-29-23: wbc 7.8; hgb 11.0; hct 32.0; mcv 94.1 plt 184; glucose 127; bun 40; creat 1.67; k+ 2.7; na++ 122; ca 8.8; gfr 37; protein 6.5 albumin 3.8; mag 3.8; tsh 2.133 09-01-23: glucose 120; bun 43; creat 1.97; k+ 3.9; na++ 132; ca 8.9; gfr 31  TODAY  09-09-23: glucose 86; bun 37; creat 1.52; k+ 4.5; na++ 137; ca 8.8; gfr 42 09-23-23: d-dimer: 0.65 10-12-23: wbc 6.7; hgb 10.2; hct 32.5; mcv 102.2 plt 171; glucose 84; bun 26; creat 1.45; k+ 4.7; na++ 136; ca 9.0; gfr 44; d-dimer 0.71     Review of Systems  Constitutional:  Negative for malaise/fatigue.  Respiratory:  Negative for cough and shortness of breath.   Cardiovascular:  Negative for chest pain, palpitations and leg swelling.  Gastrointestinal:  Negative for abdominal pain, constipation and heartburn.  Musculoskeletal:  Negative for back pain, joint pain and myalgias.  Skin: Negative.   Neurological:  Negative for dizziness.  Psychiatric/Behavioral:  The patient is not nervous/anxious.    Physical Exam Constitutional:      General: He is not in acute distress.    Appearance: He is well-developed. He is not diaphoretic.  Neck:     Thyroid: No thyromegaly.  Cardiovascular:     Rate and Rhythm: Normal rate and regular rhythm.     Pulses: Normal pulses.     Heart sounds: Normal heart sounds.  Pulmonary:     Effort: Pulmonary effort is normal. No respiratory distress.     Breath sounds: Normal breath sounds.   Abdominal:     General: Bowel sounds are normal. There is no distension.     Palpations: Abdomen is soft.     Tenderness: There is no abdominal tenderness.  Genitourinary:    Comments: foley Musculoskeletal:        General: Normal range of motion.     Cervical back: Neck supple.     Right lower leg: Edema present.     Left lower leg: Edema present.  Lymphadenopathy:  Cervical: No cervical adenopathy.  Skin:    General: Skin is warm and dry.  Neurological:     Mental Status: He is alert. Mental status is at baseline.     Comments: 09-02-23: SLUMS 8/30   Psychiatric:        Mood and Affect: Mood normal.      ASSESSMENT/ PLAN:  TODAY  Neurocognitive deficits/vascular dementia without behavioral disturbance: CT 08-29-23 demonstrates: atrophy with chronic small vessel white matter ischemic disease; SLUMS 8/30.   2. Stage 3b chronic  kidney disease (CKD): bun 26; creat 1.45 gfr 44  3. History of penile cancer/neurogenic bladder: has long term foley.   PREVIOUS   4. Chronic constipation: will continue miralax daily   5. Coronary artery disease involving native coronary artery of native heart without angina pectoris.   6.  Aortic calcification: (Ct 02-17-16)   7. SIADH (syndrome of inappropriate ADH production)/hyponatremia na++ 137; is on NACL 1 gm twice daily; and is on fluid restriction  8. Paroxsymal atrial fibrillation: heart rate is stable is off lopressor due to soft blood pressure readings.    9. Peripheral vascular disease: will monitor    Synthia Innocent NP [facility]  call [phone]

## 2023-12-02 ENCOUNTER — Non-Acute Institutional Stay (SKILLED_NURSING_FACILITY): Payer: Medicare Other | Admitting: Adult Health

## 2023-12-02 ENCOUNTER — Encounter: Payer: Self-pay | Admitting: Adult Health

## 2023-12-02 DIAGNOSIS — L03115 Cellulitis of right lower limb: Secondary | ICD-10-CM

## 2023-12-02 HISTORY — DX: Cellulitis of right lower limb: L03.115

## 2023-12-02 NOTE — Progress Notes (Signed)
Location:  [facility] Room Number: 139 Place of Service:  SNF (31)   CODE STATUS: full   No Known Allergies  Chief Complaint  Patient presents with   Acute Visit    Right thigh cellulitis     HPI:  Staff reports that his right posterior is red hot and swollen. There are no reports of fevers present. The area is painful to touch.   Past Medical History:  Diagnosis Date   Coronary artery disease    Hyperlipidemia    Obesity    Penile cancer (HCC)    Swelling of extremity    Left Leg    Past Surgical History:  Procedure Laterality Date   APPENDECTOMY     BACK SURGERY     BIOPSY  07/22/2021   Procedure: BIOPSY;  Surgeon: Lynann Bologna, MD;  Location: [facility] ENDOSCOPY;  Service: Gastroenterology;;   CHOLECYSTECTOMY     CORONARY ANGIOPLASTY  11/2005   RCA PCI AND STENTING WITH A CYPHER DES   CORONARY ARTERY BYPASS GRAFT     ESOPHAGOGASTRODUODENOSCOPY (EGD) WITH PROPOFOL N/A 07/22/2021   Procedure: ESOPHAGOGASTRODUODENOSCOPY (EGD) WITH PROPOFOL;  Surgeon: Lynann Bologna, MD;  Location: [facility] ENDOSCOPY;  Service: Gastroenterology;  Laterality: N/A;   HIATAL HERNIA REPAIR     lymph node removal     NM MYOCAR MULTIPLE W/SPECT  11/26/2009   EF 62%. NORMAL MYOCARDIAL PERFUSION STUDY.   TRANSTHORACIC ECHOCARDIOGRAM  12/22/2005   MILD TO MOD AORTIC SCLEROSIS W/O STENOSIS. MILD TO MODERATE MITRAL CALCIFICATION. LA- MILDLY DILATED.    Social History   Socioeconomic History   Marital status: Divorced    Spouse name: Not on file   Number of children: Not on file   Years of education: Not on file   Highest education level: Not on file  Occupational History   Occupation: retired    Associate Professor: RETIRED  Tobacco Use   Smoking status: Former    Current packs/day: 0.00    Types: Cigarettes    Quit date: 07/14/1983    Years since quitting: 40.4   Smokeless tobacco: Never  Vaping Use   Vaping status: Never Used  Substance and Sexual Activity   Alcohol use: No    Drug use: No   Sexual activity: Not on file  Other Topics Concern   Not on file  Social History Narrative   Not on file   Social Determinants of Health   Financial Resource Strain: Not on file  Food Insecurity: No Food Insecurity (08/29/2023)   Hunger Vital Sign    Worried About Running Out of Food in the Last Year: Never true    Ran Out of Food in the Last Year: Never true  Transportation Needs: No Transportation Needs (08/29/2023)   PRAPARE - Administrator, Civil Service (Medical): No    Lack of Transportation (Non-Medical): No  Physical Activity: Not on file  Stress: Not on file  Social Connections: Not on file  Intimate Partner Violence: Not At Risk (08/29/2023)   Humiliation, Afraid, Rape, and Kick questionnaire    Fear of Current or Ex-Partner: No    Emotionally Abused: No    Physically Abused: No    Sexually Abused: No   Family History  Problem Relation Age of Onset   Cancer Father    Cancer Sister    Cancer Brother    Cancer Sister    Cancer Other       VITAL SIGNS BP 116/63   Pulse  82   Temp (!) 97.1 F (36.2 C)   Resp 20   Ht 5\' 10"  (1.778 m)   Wt 248 lb 12.8 oz (112.9 kg)   SpO2 100%   BMI 35.70 kg/m   Outpatient Encounter Medications as of 12/02/2023  Medication Sig   AMBULATORY NON FORMULARY MEDICATION Take 1,500 mLs by mouth as needed. 1500 cc  fluid restriction   Multiple Vitamins-Minerals (PRESERVISION AREDS 2) CAPS Take 1 tablet by mouth daily.   nystatin (MYCOSTATIN/NYSTOP) powder Apply topically 3 (three) times daily.   sennosides-docusate sodium (SENOKOT-S) 8.6-50 MG tablet Take 1 tablet by mouth daily.   sodium chloride 1 g tablet Take 1 tablet (1 g total) by mouth 2 (two) times daily with a meal.   spironolactone (ALDACTONE) 25 MG tablet Take 0.5 tablets (12.5 mg total) by mouth daily.   No facility-administered encounter medications on file as of 12/02/2023.     SIGNIFICANT DIAGNOSTIC EXAMS  08-29-23: wbc 7.8; hgb 11.0; hct  32.0; mcv 94.1 plt 184; glucose 127; bun 40; creat 1.67; k+ 2.7; na++ 122; ca 8.8; gfr 37; protein 6.5 albumin 3.8; mag 3.8; tsh 2.133 09-01-23: glucose 120; bun 43; creat 1.97; k+ 3.9; na++ 132; ca 8.9; gfr 31 09-09-23: glucose 86; bun 37; creat 1.52; k+ 4.5; na++ 137; ca 8.8; gfr 42 09-23-23: d-dimer: 0.65 10-12-23: wbc 6.7; hgb 10.2; hct 32.5; mcv 102.2 plt 171; glucose 84; bun 26; creat 1.45; k+ 4.7; na++ 136; ca 9.0; gfr 44; d-dimer 0.71    NO NEW LABS.   Review of Systems  Constitutional:  Negative for malaise/fatigue.  Respiratory:  Negative for cough and shortness of breath.   Cardiovascular:  Positive for leg swelling. Negative for chest pain and palpitations.  Gastrointestinal:  Negative for abdominal pain, constipation and heartburn.  Musculoskeletal:  Negative for back pain, joint pain and myalgias.  Skin:        Back of right thigh red and painful   Neurological:  Negative for dizziness.  Psychiatric/Behavioral:  The patient is not nervous/anxious.     Physical Exam Constitutional:      General: He is not in acute distress.    Appearance: He is well-developed. He is not diaphoretic.  Neck:     Thyroid: No thyromegaly.  Cardiovascular:     Rate and Rhythm: Normal rate and regular rhythm.     Pulses: Normal pulses.     Heart sounds: Normal heart sounds.  Pulmonary:     Effort: Pulmonary effort is normal. No respiratory distress.     Breath sounds: Normal breath sounds.  Abdominal:     General: Bowel sounds are normal. There is no distension.     Palpations: Abdomen is soft.     Tenderness: There is no abdominal tenderness.  Musculoskeletal:        General: Normal range of motion.     Cervical back: Neck supple.     Right lower leg: Edema present.     Left lower leg: Edema present.  Lymphadenopathy:     Cervical: No cervical adenopathy.  Skin:    General: Skin is warm and dry.     Comments: Legs scaly Right posterior thigh: red swelling; warm and painful    Neurological:     Mental Status: He is alert. Mental status is at baseline.     Comments: 09-02-23: SLUMS 8/30    Psychiatric:        Mood and Affect: Mood normal.    ASSESSMENT/ PLAN:  TODAY  Cellulitis of right thigh: will begin doxycyline 100 mg twice daily through 12-07-23.    Synthia Innocent NP [facility]   call [phone]

## 2023-12-07 ENCOUNTER — Non-Acute Institutional Stay (SKILLED_NURSING_FACILITY): Payer: Self-pay | Admitting: Adult Health

## 2023-12-07 ENCOUNTER — Encounter: Payer: Self-pay | Admitting: Adult Health

## 2023-12-07 DIAGNOSIS — Z0000 Encounter for general adult medical examination without abnormal findings: Secondary | ICD-10-CM

## 2023-12-07 NOTE — Progress Notes (Signed)
Subjective:   Caleb [name] is a 89 y.o. male who presents for Medicare Annual/Subsequent preventive examination.  Visit Complete: In person  Patient Medicare AWV questionnaire was completed by the patient on 12-07-23; I have confirmed that all information answered by patient is correct and no changes since this date.  Cardiac Risk Factors include: advanced age (>6men, >19 women);obesity (BMI >30kg/m2);sedentary lifestyle     Objective:    Today's Vitals   12/07/23 0956  BP: 130/80  Pulse: 78  Resp: 20  Temp: 97.6 F (36.4 C)  SpO2: 96%  Weight: 248 lb 12.8 oz (112.9 kg)  Height: 5\' 10"  (1.778 m)   Body mass index is 35.7 kg/m.     09/17/2023   10:13 AM 09/16/2023    4:20 PM 08/29/2023    3:00 PM 08/29/2023   11:25 AM 07/16/2023    9:18 PM 07/02/2023   12:25 PM 07/01/2023    6:36 PM  Advanced Directives  Does Patient Have a Medical Advance Directive? No No No No Yes  No  Type of Agricultural consultant;Living will    Does patient want to make changes to medical advance directive?     No - Patient declined    Copy of Healthcare Power of Attorney in Chart?     No - copy requested, Physician notified    Would patient like information on creating a medical advance directive?   No - Patient declined  No - Patient declined No - Patient declined     Current Medications (verified) Outpatient Encounter Medications as of 12/07/2023  Medication Sig   AMBULATORY NON FORMULARY MEDICATION Take 1,500 mLs by mouth as needed. 1500 cc  fluid restriction   doxycycline (VIBRAMYCIN) 100 MG capsule Take 100 mg by mouth 2 (two) times daily.   Multiple Vitamins-Minerals (PRESERVISION AREDS 2) CAPS Take 1 tablet by mouth daily.   nystatin (MYCOSTATIN/NYSTOP) powder Apply topically 3 (three) times daily.   sennosides-docusate sodium (SENOKOT-S) 8.6-50 MG tablet Take 1 tablet by mouth daily.   sodium chloride 1 g tablet Take 1 tablet (1 g total) by mouth 2 (two) times  daily with a meal.   spironolactone (ALDACTONE) 25 MG tablet Take 0.5 tablets (12.5 mg total) by mouth daily.   No facility-administered encounter medications on file as of 12/07/2023.    Allergies (verified) Patient has no known allergies.   History: Past Medical History:  Diagnosis Date   Coronary artery disease    Hyperlipidemia    Obesity    Penile cancer (HCC)    Swelling of extremity    Left Leg   Past Surgical History:  Procedure Laterality Date   APPENDECTOMY     BACK SURGERY     BIOPSY  07/22/2021   Procedure: BIOPSY;  Surgeon: Lynann Bologna, MD;  Location: [facility] ENDOSCOPY;  Service: Gastroenterology;;   CHOLECYSTECTOMY     CORONARY ANGIOPLASTY  11/2005   RCA PCI AND STENTING WITH A CYPHER DES   CORONARY ARTERY BYPASS GRAFT     ESOPHAGOGASTRODUODENOSCOPY (EGD) WITH PROPOFOL N/A 07/22/2021   Procedure: ESOPHAGOGASTRODUODENOSCOPY (EGD) WITH PROPOFOL;  Surgeon: Lynann Bologna, MD;  Location: [facility] ENDOSCOPY;  Service: Gastroenterology;  Laterality: N/A;   HIATAL HERNIA REPAIR     lymph node removal     NM MYOCAR MULTIPLE W/SPECT  11/26/2009   EF 62%. NORMAL MYOCARDIAL PERFUSION STUDY.   TRANSTHORACIC ECHOCARDIOGRAM  12/22/2005   MILD TO MOD AORTIC SCLEROSIS W/O STENOSIS. MILD TO  MODERATE MITRAL CALCIFICATION. LA- MILDLY DILATED.   Family History  Problem Relation Age of Onset   Cancer Father    Cancer Sister    Cancer Brother    Cancer Sister    Cancer Other    Social History   Socioeconomic History   Marital status: Divorced    Spouse name: Not on file   Number of children: Not on file   Years of education: Not on file   Highest education level: Not on file  Occupational History   Occupation: retired    Associate Professor: RETIRED  Tobacco Use   Smoking status: Former    Current packs/day: 0.00    Types: Cigarettes    Quit date: 07/14/1983    Years since quitting: 40.4   Smokeless tobacco: Never  Vaping Use   Vaping status: Never Used  Substance and Sexual  Activity   Alcohol use: No   Drug use: No   Sexual activity: Not on file  Other Topics Concern   Not on file  Social History Narrative   Not on file   Social Determinants of Health   Financial Resource Strain: Not on file  Food Insecurity: No Food Insecurity (08/29/2023)   Hunger Vital Sign    Worried About Running Out of Food in the Last Year: Never true    Ran Out of Food in the Last Year: Never true  Transportation Needs: No Transportation Needs (08/29/2023)   PRAPARE - Administrator, Civil Service (Medical): No    Lack of Transportation (Non-Medical): No  Physical Activity: Not on file  Stress: Not on file  Social Connections: Not on file    Tobacco Counseling Counseling given: Not Answered   Clinical Intake:  Pre-visit preparation completed: Yes  Pain : No/denies pain     BMI - recorded: 35.7 Nutritional Status: BMI > 30  Obese Nutritional Risks: Unintentional weight gain Diabetes: No  How often do you need to have someone help you when you read instructions, pamphlets, or other written materials from your doctor or pharmacy?: 5 - Always  Interpreter Needed?: No      Activities of Daily Living    12/07/2023   11:09 AM 10/21/2023   12:19 PM  In your present state of health, do you have any difficulty performing the following activities:  Hearing? 0 0  Vision? 0 0  Difficulty concentrating or making decisions? 1 1  Walking or climbing stairs? 1 1  Dressing or bathing? 1 1  Doing errands, shopping? 1 1  Preparing Food and eating ? Y Y  Using the Toilet? Y Y  In the past six months, have you accidently leaked urine? Y Y  Do you have problems with loss of bowel control? Y Y  Managing your Medications? Y Y  Managing your Finances? Caleb [name]  Housekeeping or managing your Housekeeping? Caleb [name]    Patient Care Team: Sharee Holster, NP as PCP - General (Geriatric Medicine)  Indicate any recent Medical Services you may have received from other than  [facility] providers in the past year (date may be approximate).     Assessment:   This is a routine wellness examination for Caleb [name].  Hearing/Vision screen No results found.   Goals Addressed             This Visit's Progress    Absence of Fall and Fall-Related Injury   On track    Evidence-based guidance:  Assess fall risk using a validated tool when  available. Consider balance and gait impairment, muscle weakness, diminished vision or hearing, environmental hazards, presence of urinary or bowel urgency and/or incontinence.  Communicate fall injury risk to interprofessional healthcare team.  Develop a fall prevention plan with the patient and family.  Promote use of personal vision and auditory aids.  Promote reorientation, appropriate sensory stimulation, and routines to decrease risk of fall when changes in mental status are present.  Assess assistance level required for safe and effective self-care; consider referral for home care.  Encourage physical activity, such as performance of self-care at highest level of ability, strength and balance exercise program, and provision of appropriate assistive devices; refer to rehabilitation therapy.  Refer to community-based fall prevention program where available.  If fall occurs, determine the cause and revise fall injury prevention plan.  Regularly review medication contribution to fall risk; consider risk related to polypharmacy and age.  Refer to pharmacist for consultation when concerns about medications are revealed.  Balance adequate pain management with potential for oversedation.  Provide guidance related to environmental modifications.  Consider supplementation with Vitamin D.   Notes:      DIET - INCREASE WATER INTAKE       General - Client will not be readmitted within 30 days (C-SNP)   On track      Depression Screen    12/07/2023   11:11 AM 10/21/2023   12:21 PM  PHQ 2/9 Scores  PHQ - 2 Score 0 0    Fall Risk     12/07/2023   11:10 AM 10/21/2023   12:20 PM  Fall Risk   Falls in the past year? 1 1  Number falls in past yr: 0 1  Injury with Fall? 0 0  Risk for fall due to : History of fall(s);Impaired balance/gait;Impaired mobility History of fall(s);Impaired balance/gait;Impaired mobility  Follow up  Falls evaluation completed    MEDICARE RISK AT HOME: Medicare Risk at Home Any stairs in or around the home?: Yes If so, are there any without handrails?: No Home free of loose throw rugs in walkways, pet beds, electrical cords, etc?: Yes Adequate lighting in your home to reduce risk of falls?: Yes Life alert?: No Use of a cane, [place] or w/c?: Yes Grab bars in the bathroom?: Yes Shower chair or bench in shower?: Yes Elevated toilet seat or a handicapped toilet?: Yes  TIMED UP AND GO:  Was the test performed?  No    Cognitive Function:    12/07/2023   11:11 AM 10/21/2023   12:21 PM  MMSE - Mini Mental State Exam  Not completed: Unable to complete Unable to complete        Immunizations Immunization History  Administered Date(s) Administered   H1N1 10/30/2015   Influenza, High Dose Seasonal PF 10/16/2020   Influenza-Unspecified 10/28/2014, 09/27/2018, 11/17/2022   Moderna Sars-Covid-2 Vaccination 01/09/2020, 02/09/2020, 12/12/2020, 11/13/2022, 10/05/2023   PNEUMOCOCCAL CONJUGATE-20 09/22/2023   Pneumococcal Polysaccharide-23 11/15/1995, 10/19/2006   Tdap 01/12/2016    TDAP status: Due, Education has been provided regarding the importance of this vaccine. Advised may receive this vaccine at local pharmacy or Health Dept. Aware to provide a copy of the vaccination record if obtained from local pharmacy or Health Dept. Verbalized acceptance and understanding.  Flu Vaccine status: Declined, Education has been provided regarding the importance of this vaccine but patient still declined. Advised may receive this vaccine at local pharmacy or Health Dept. Aware to provide a copy of the  vaccination record if obtained from local pharmacy  or Health Dept. Verbalized acceptance and understanding.  Pneumococcal vaccine status: Up to date  Covid-19 vaccine status: Completed vaccines  Qualifies for Shingles Vaccine? No   Zostavax completed No   Shingrix Completed?: No.    Education has been provided regarding the importance of this vaccine. Patient has been advised to call insurance company to determine out of pocket expense if they have not yet received this vaccine. Advised may also receive vaccine at local pharmacy or Health Dept. Verbalized acceptance and understanding.  Screening Tests Health Maintenance  Topic Date Due   Zoster Vaccines- Shingrix (1 of 2) 03/06/2024 (Originally 02/02/1975)   INFLUENZA VACCINE  03/27/2024 (Originally 07/29/2023)   COVID-19 Vaccine (6 - 2023-24 season) 12/05/2024 (Originally 11/30/2023)   Medicare Annual Wellness (AWV)  12/06/2024   DTaP/Tdap/Td (2 - Td or Tdap) 01/11/2026   Pneumonia Vaccine 62+ Years old  Completed   HPV VACCINES  Aged Out    Health Maintenance  There are no preventive care reminders to display for this patient.  Colorectal cancer screening: No longer required.   Lung Cancer Screening: (Low Dose CT Chest recommended if Age 72-80 years, 20 pack-year currently smoking OR have quit w/in 15years.) does not qualify.   Lung Cancer Screening Referral:   Additional Screening:  Hepatitis C Screening: does not qualify; Completed   Vision Screening: Recommended annual ophthalmology exams for early detection of glaucoma and other disorders of the eye. Is the patient up to date with their annual eye exam?  No  Who is the provider or what is the name of the office in which the patient attends annual eye exams?  If pt is not established with a provider, would they like to be referred to a provider to establish care? No .   Dental Screening: Recommended annual dental exams for proper oral hygiene  Diabetic Foot Exam: Diabetic  Foot Exam: Completed n/a  Community Resource Referral / Chronic Care Management: CRR required this visit?  No   CCM required this visit?  No     Plan:     I have personally reviewed and noted the following in the patient's chart:   Medical and social history Use of alcohol, tobacco or illicit drugs  Current medications and supplements including opioid prescriptions. Patient is not currently taking opioid prescriptions. Functional ability and status Nutritional status Physical activity Advanced directives List of other physicians Hospitalizations, surgeries, and ER visits in previous 12 months Vitals Screenings to include cognitive, depression, and falls Referrals and appointments  In addition, I have reviewed and discussed with patient certain preventive protocols, quality metrics, and best practice recommendations. A written personalized care plan for preventive services as well as general preventive health recommendations were provided to patient.     Sharee Holster, NP   12/07/2023   After Visit Summary: (In Person-Declined) Patient declined AVS at this time.  Nurse Notes: this exam was performed by myself at this facility

## 2023-12-07 NOTE — Patient Instructions (Signed)
   [name] , Thank you for taking time to come for your Medicare Wellness Visit. I appreciate your ongoing commitment to your health goals. Please review the following plan we discussed and let me know if I can assist you in the future.   These are the goals we discussed:  Goals      Absence of Fall and Fall-Related Injury     Evidence-based guidance:  Assess fall risk using a validated tool when available. Consider balance and gait impairment, muscle weakness, diminished vision or hearing, environmental hazards, presence of urinary or bowel urgency and/or incontinence.  Communicate fall injury risk to interprofessional healthcare team.  Develop a fall prevention plan with the patient and family.  Promote use of personal vision and auditory aids.  Promote reorientation, appropriate sensory stimulation, and routines to decrease risk of fall when changes in mental status are present.  Assess assistance level required for safe and effective self-care; consider referral for home care.  Encourage physical activity, such as performance of self-care at highest level of ability, strength and balance exercise program, and provision of appropriate assistive devices; refer to rehabilitation therapy.  Refer to community-based fall prevention program where available.  If fall occurs, determine the cause and revise fall injury prevention plan.  Regularly review medication contribution to fall risk; consider risk related to polypharmacy and age.  Refer to pharmacist for consultation when concerns about medications are revealed.  Balance adequate pain management with potential for oversedation.  Provide guidance related to environmental modifications.  Consider supplementation with Vitamin D.   Notes:      DIET - INCREASE WATER INTAKE     General - Client will not be readmitted within 30 days (C-SNP)        This is a list of the screening recommended for you and due dates:  Health Maintenance   Topic Date Due   Zoster (Shingles) Vaccine (1 of 2) 03/06/2024*   Flu Shot  03/27/2024*   COVID-19 Vaccine (6 - 2023-24 season) 12/05/2024*   Medicare Annual Wellness Visit  12/06/2024   DTaP/Tdap/Td vaccine (2 - Td or Tdap) 01/11/2026   Pneumonia Vaccine  Completed   HPV Vaccine  Aged Out  *Topic was postponed. The date shown is not the original due date.

## 2023-12-17 ENCOUNTER — Encounter: Payer: Self-pay | Admitting: Adult Health

## 2023-12-17 ENCOUNTER — Non-Acute Institutional Stay (SKILLED_NURSING_FACILITY): Payer: Self-pay | Admitting: Adult Health

## 2023-12-17 DIAGNOSIS — F0150 Vascular dementia without behavioral disturbance: Secondary | ICD-10-CM

## 2023-12-17 DIAGNOSIS — E222 Syndrome of inappropriate secretion of antidiuretic hormone: Secondary | ICD-10-CM

## 2023-12-17 DIAGNOSIS — I480 Paroxysmal atrial fibrillation: Secondary | ICD-10-CM | POA: Diagnosis not present

## 2023-12-17 NOTE — Progress Notes (Signed)
Location:  [facility] Room Number: 139 Place of Service:  SNF (31)   CODE STATUS: full   No Known Allergies  Chief Complaint  Patient presents with   Acute Visit    Care plan meeting     HPI:  We have come together for his care plan. BIMS 14/15 mood 0/30. Is nonambulatory without falls. He requires moderate to dependent assist with his adls. He is incontinent of bladder and bowel. Dietary: regular diet; weight is 248 pounds; requires setup for meals; appetite 76-100%. He remains on 1500 cc fluid restriction. Therapy; none at this time  Activities: spends his time in his room. He will continue to be followed for his chronic illnesses including:   Paroxsymal atrial fibrillation  SIADH (syndrome of inappropriate ADH production)   Vascular dementia without behavioral disturbance  Past Medical History:  Diagnosis Date   Coronary artery disease    Hyperlipidemia    Obesity    Penile cancer (HCC)    Swelling of extremity    Left Leg    Past Surgical History:  Procedure Laterality Date   APPENDECTOMY     BACK SURGERY     BIOPSY  07/22/2021   Procedure: BIOPSY;  Surgeon: Lynann Bologna, MD;  Location: [facility] ENDOSCOPY;  Service: Gastroenterology;;   CHOLECYSTECTOMY     CORONARY ANGIOPLASTY  11/2005   RCA PCI AND STENTING WITH A CYPHER DES   CORONARY ARTERY BYPASS GRAFT     ESOPHAGOGASTRODUODENOSCOPY (EGD) WITH PROPOFOL N/A 07/22/2021   Procedure: ESOPHAGOGASTRODUODENOSCOPY (EGD) WITH PROPOFOL;  Surgeon: Lynann Bologna, MD;  Location: [facility] ENDOSCOPY;  Service: Gastroenterology;  Laterality: N/A;   HIATAL HERNIA REPAIR     lymph node removal     NM MYOCAR MULTIPLE W/SPECT  11/26/2009   EF 62%. NORMAL MYOCARDIAL PERFUSION STUDY.   TRANSTHORACIC ECHOCARDIOGRAM  12/22/2005   MILD TO MOD AORTIC SCLEROSIS W/O STENOSIS. MILD TO MODERATE MITRAL CALCIFICATION. LA- MILDLY DILATED.    Social History   Socioeconomic History   Marital status: Divorced    Spouse name: Not  on file   Number of children: Not on file   Years of education: Not on file   Highest education level: Not on file  Occupational History   Occupation: retired    Associate Professor: RETIRED  Tobacco Use   Smoking status: Former    Current packs/day: 0.00    Types: Cigarettes    Quit date: 07/14/1983    Years since quitting: 40.4   Smokeless tobacco: Never  Vaping Use   Vaping status: Never Used  Substance and Sexual Activity   Alcohol use: No   Drug use: No   Sexual activity: Not on file  Other Topics Concern   Not on file  Social History Narrative   Not on file   Social Drivers of Health   Financial Resource Strain: Not on file  Food Insecurity: No Food Insecurity (08/29/2023)   Hunger Vital Sign    Worried About Running Out of Food in the Last Year: Never true    Ran Out of Food in the Last Year: Never true  Transportation Needs: No Transportation Needs (08/29/2023)   PRAPARE - Administrator, Civil Service (Medical): No    Lack of Transportation (Non-Medical): No  Physical Activity: Not on file  Stress: Not on file  Social Connections: Not on file  Intimate Partner Violence: Not At Risk (08/29/2023)   Humiliation, Afraid, Rape, and Kick questionnaire    Fear  of Current or Ex-Partner: No    Emotionally Abused: No    Physically Abused: No    Sexually Abused: No   Family History  Problem Relation Age of Onset   Cancer Father    Cancer Sister    Cancer Brother    Cancer Sister    Cancer Other       VITAL SIGNS BP (!) 122/59   Pulse 100   Temp 98.1 F (36.7 C)   Resp 18   Ht 5' 10.5" (1.791 m)   Wt 248 lb 12.8 oz (112.9 kg)   SpO2 94%   BMI 35.19 kg/m   Outpatient Encounter Medications as of 12/17/2023  Medication Sig   AMBULATORY NON FORMULARY MEDICATION Take 1,500 mLs by mouth as needed. 1500 cc  fluid restriction   Multiple Vitamins-Minerals (PRESERVISION AREDS 2) CAPS Take 1 tablet by mouth daily.   nystatin (MYCOSTATIN/NYSTOP) powder Apply  topically 3 (three) times daily.   sennosides-docusate sodium (SENOKOT-S) 8.6-50 MG tablet Take 1 tablet by mouth daily.   sodium chloride 1 g tablet Take 1 tablet (1 g total) by mouth 2 (two) times daily with a meal.   spironolactone (ALDACTONE) 25 MG tablet Take 0.5 tablets (12.5 mg total) by mouth daily.   No facility-administered encounter medications on file as of 12/17/2023.     SIGNIFICANT DIAGNOSTIC EXAMS   08-29-23: wbc 7.8; hgb 11.0; hct 32.0; mcv 94.1 plt 184; glucose 127; bun 40; creat 1.67; k+ 2.7; na++ 122; ca 8.8; gfr 37; protein 6.5 albumin 3.8; mag 3.8; tsh 2.133 09-01-23: glucose 120; bun 43; creat 1.97; k+ 3.9; na++ 132; ca 8.9; gfr 31 09-09-23: glucose 86; bun 37; creat 1.52; k+ 4.5; na++ 137; ca 8.8; gfr 42 09-23-23: d-dimer: 0.65 10-12-23: wbc 6.7; hgb 10.2; hct 32.5; mcv 102.2 plt 171; glucose 84; bun 26; creat 1.45; k+ 4.7; na++ 136; ca 9.0; gfr 44; d-dimer 0.71    NO NEW LABS.   Review of Systems  Constitutional:  Negative for malaise/fatigue.  Respiratory:  Negative for cough and shortness of breath.   Cardiovascular:  Negative for chest pain, palpitations and leg swelling.  Gastrointestinal:  Negative for abdominal pain, constipation and heartburn.  Musculoskeletal:  Negative for back pain, joint pain and myalgias.  Skin: Negative.   Neurological:  Negative for dizziness.  Psychiatric/Behavioral:  The patient is not nervous/anxious.     Physical Exam Constitutional:      General: He is not in acute distress.    Appearance: He is well-developed. He is obese. He is not diaphoretic.  Neck:     Thyroid: No thyromegaly.  Cardiovascular:     Rate and Rhythm: Normal rate and regular rhythm.     Pulses: Normal pulses.     Heart sounds: Normal heart sounds.  Pulmonary:     Effort: Pulmonary effort is normal. No respiratory distress.     Breath sounds: Normal breath sounds.  Abdominal:     General: Bowel sounds are normal. There is no distension.      Palpations: Abdomen is soft.     Tenderness: There is no abdominal tenderness.  Musculoskeletal:        General: Normal range of motion.     Cervical back: Neck supple.     Right lower leg: Edema present.     Left lower leg: Edema present.  Lymphadenopathy:     Cervical: No cervical adenopathy.  Skin:    General: Skin is warm and dry.  Neurological:  Mental Status: He is alert. Mental status is at baseline.     Comments: 09-02-23: SLUMS 8/30   Psychiatric:        Mood and Affect: Mood normal.    ASSESSMENT/ PLAN:  TODAY  Paroxsymal atrial fibrillation SIADH (syndrome of inappropriate ADH production) Vascular dementia without behavioral disturbance  Will continue current medications; will stop fluid restriction.  Will continue current plan of care Will continue to monitor his status.   Time spent with patient: 40 minutes: medications; adls; dietary.    Synthia Innocent NP [facility] Adult Medicine  call [phone]

## 2023-12-20 ENCOUNTER — Encounter: Payer: Self-pay | Admitting: Internal Medicine

## 2023-12-20 ENCOUNTER — Non-Acute Institutional Stay (SKILLED_NURSING_FACILITY): Payer: Self-pay | Admitting: Internal Medicine

## 2023-12-20 DIAGNOSIS — L03115 Cellulitis of right lower limb: Secondary | ICD-10-CM

## 2023-12-20 DIAGNOSIS — I872 Venous insufficiency (chronic) (peripheral): Secondary | ICD-10-CM | POA: Diagnosis not present

## 2023-12-20 DIAGNOSIS — I480 Paroxysmal atrial fibrillation: Secondary | ICD-10-CM

## 2023-12-20 NOTE — Assessment & Plan Note (Signed)
Clinically there was not significant cellulitis in the right lower extremity.  He is complaining vehemently about the pain and swelling associated with the Rocephin injections.  He will be changed back to doxycycline 100 mg twice daily with follow-up to monitor response.

## 2023-12-20 NOTE — Progress Notes (Unsigned)
NURSING HOME LOCATION:  [facility] ROOM NUMBER:  [id number] W  CODE STATUS:  DNR  PCP:  Synthia Innocent NP  This is a nursing facility follow up visit of chronic medical diagnoses & to document compliance with Regulation 483.30 (c) in The Long Term Care Survey Manual Phase 2 which mandates caregiver visit ( visits can alternate among physician, PA or NP as per statutes) within 10 days of 30 days / 60 days/ 90 days post admission to SNF date  . Additionally Staff reports RLE swollen , hot to touch , & painful.  Interim medical record and care since last SNF visit was updated with review of diagnostic studies and change in clinical status since last visit were documented.  HPI: He is a permanent resident of this facility with history of coronary artery disease, s/p angioplasty as well as CABG; history of penile cancer; chronic peripheral edema; essential hypertension; history of atrial fibrillation; history of multiple duodenal ulcers with GI bleed; history of SIADH; vascular dementia; dyslipidemia; history of neurogenic bladder. [facility] NP ordered Rocephin 1 g IM x 7 days on 12/20.  Apparently this was not initiated until last night according to his nurse.  She states that he has had shots in the last 2 months.  Review of systems: When I ask him how he was doing his sarcastic reply was "am I still alive?"  He then stated he was doing "pretty good."  He began to complain about having had a shot in his right leg for the last 2 nights which was associated with significant pain.  He also stated that the area would remain swollen for an hour after the shot.  He denied any other associated pain or tenderness to touch.  He describes constipation sometimes.  There has been no change in his peripheral edema according to him. Constitutional: No fever, significant weight change, fatigue  Eyes: No redness, discharge, pain, vision change ENT/mouth: No nasal congestion,  purulent discharge, earache,  change in hearing, sore throat  Cardiovascular: No chest pain, palpitations, paroxysmal nocturnal dyspnea, claudication, edema  Respiratory: No cough, sputum production, hemoptysis, DOE, significant snoring, apnea   Gastrointestinal: No heartburn, dysphagia, abdominal pain, nausea /vomiting, rectal bleeding, melena, change in bowels Genitourinary: No dysuria, hematuria, pyuria, incontinence, nocturia Musculoskeletal: No joint stiffness, joint swelling, weakness, pain Dermatologic: No rash, pruritus, change in appearance of skin Neurologic: No dizziness, headache, syncope, seizures, numbness, tingling Psychiatric: No significant anxiety, depression, insomnia, anorexia Endocrine: No change in hair/skin/nails, excessive thirst, excessive hunger, excessive urination  Hematologic/lymphatic: No significant bruising, lymphadenopathy, abnormal bleeding Allergy/immunology: No itchy/watery eyes, significant sneezing, urticaria, angioedema  Physical exam:  Pertinent or positive findings: He appears much younger than his stated age.  He is markedly hard of hearing.  He has pattern alopecia.  A grade 1.5 systolic murmur is noted at the base.  Abdomen is protuberant.  Foley catheter is present.  He has tense edema of the lower extremities.  Compression devices are present.  Pedal pulses not palpable.  He has interosseous wasting of the hands.  There is a bandage over the right foot.  There is faint erythema of the right lower extremity below the knee with minimal blanching.  Clinically there is no evidence of any significant cellulitis.  He has patchy faint hyperpigmentation and ecchymoses over the forearms. General appearance: Adequately nourished; no acute distress, increased work of breathing is present.   Lymphatic: No lymphadenopathy about the head, neck, axilla. Eyes: No conjunctival inflammation or  lid edema is present. There is no scleral icterus. Ears:  External ear exam shows no significant lesions or  deformities.   Nose:  External nasal examination shows no deformity or inflammation. Nasal mucosa are pink and moist without lesions, exudates Oral exam:  Lips and gums are healthy appearing. There is no oropharyngeal erythema or exudate. Neck:  No thyromegaly, masses, tenderness noted.    Heart:  Normal rate and regular rhythm. S1 and S2 normal without gallop, murmur, click, rub .  Lungs: Chest clear to auscultation without wheezes, rhonchi, rales, rubs. Abdomen: Bowel sounds are normal. Abdomen is soft and nontender with no organomegaly, hernias, masses. GU: Deferred  Extremities:  No cyanosis, clubbing, edema  Neurologic exam : Cn 2-7 intact Strength equal  in upper & lower extremities Balance, Rhomberg, finger to nose testing could not be completed due to clinical state Deep tendon reflexes are equal Skin: Warm & dry w/o tenting. No significant lesions or rash.  See summary under each active problem in the Problem List with associated updated therapeutic plan

## 2023-12-20 NOTE — Patient Instructions (Signed)
See assessment and plan under each diagnosis in the problem list and acutely for this visit 

## 2023-12-20 NOTE — Assessment & Plan Note (Addendum)
There is significant tense edema of the lower extremities but clinically this is stable.  Continue to monitor and adjust diuretic therapy as necessary.  Clinically there may be some lymphedema component in the context of his prior history of penile cancer.  Which would not respond to diuretics

## 2023-12-20 NOTE — Assessment & Plan Note (Signed)
Clinically rhythm is regular and rate well-controlled.  No change indicated.

## 2023-12-24 ENCOUNTER — Encounter (HOSPITAL_COMMUNITY)
Admission: RE | Admit: 2023-12-24 | Discharge: 2023-12-24 | Disposition: A | Discharge status: Home / Self Care | Payer: Medicare Other | Source: Skilled Nursing Facility | Attending: Adult Health | Admitting: Adult Health

## 2023-12-24 DIAGNOSIS — N1832 Chronic kidney disease, stage 3b: Secondary | ICD-10-CM | POA: Diagnosis not present

## 2023-12-24 LAB — BASIC METABOLIC PANEL
Anion gap: 9 (ref 5–15)
BUN: 34 mg/dL — ABNORMAL HIGH (ref 8–23)
CO2: 23 mmol/L (ref 22–32)
Calcium: 9.3 mg/dL (ref 8.9–10.3)
Chloride: 105 mmol/L (ref 98–111)
Creatinine, Ser: 1.56 mg/dL — ABNORMAL HIGH (ref 0.61–1.24)
GFR, Estimated: 40 mL/min — ABNORMAL LOW (ref >60)
Glucose, Bld: 117 mg/dL — ABNORMAL HIGH (ref 70–99)
Potassium: 4.2 mmol/L (ref 3.5–5.1)
Sodium: 137 mmol/L (ref 135–145)

## 2023-12-30 ENCOUNTER — Other Ambulatory Visit (HOSPITAL_COMMUNITY)
Admission: RE | Admit: 2023-12-30 | Discharge: 2023-12-30 | Disposition: A | Discharge status: Home / Self Care | Payer: Medicare Other | Source: Skilled Nursing Facility | Attending: Adult Health | Admitting: Adult Health

## 2023-12-30 DIAGNOSIS — E222 Syndrome of inappropriate secretion of antidiuretic hormone: Secondary | ICD-10-CM | POA: Diagnosis not present

## 2023-12-30 LAB — BASIC METABOLIC PANEL
Anion gap: 6 (ref 5–15)
BUN: 28 mg/dL — ABNORMAL HIGH (ref 8–23)
CO2: 24 mmol/L (ref 22–32)
Calcium: 9 mg/dL (ref 8.9–10.3)
Chloride: 107 mmol/L (ref 98–111)
Creatinine, Ser: 1.45 mg/dL — ABNORMAL HIGH (ref 0.61–1.24)
GFR, Estimated: 44 mL/min — ABNORMAL LOW (ref >60)
Glucose, Bld: 79 mg/dL (ref 70–99)
Potassium: 4.2 mmol/L (ref 3.5–5.1)
Sodium: 137 mmol/L (ref 135–145)

## 2024-01-25 ENCOUNTER — Encounter: Payer: Self-pay | Admitting: Adult Health

## 2024-01-27 ENCOUNTER — Encounter: Payer: Self-pay | Admitting: Adult Health

## 2024-01-27 ENCOUNTER — Non-Acute Institutional Stay (SKILLED_NURSING_FACILITY): Payer: Medicare Other | Admitting: Adult Health

## 2024-01-27 DIAGNOSIS — I700 Atherosclerosis of aorta: Secondary | ICD-10-CM | POA: Diagnosis not present

## 2024-01-27 DIAGNOSIS — K5909 Other constipation: Secondary | ICD-10-CM | POA: Diagnosis not present

## 2024-01-27 DIAGNOSIS — I2510 Atherosclerotic heart disease of native coronary artery without angina pectoris: Secondary | ICD-10-CM | POA: Diagnosis not present

## 2024-01-27 NOTE — Progress Notes (Signed)
Location:  [facility] Room Number: 139/W Place of Service:  SNF (31)   CODE STATUS: Full Code  No Known Allergies  Chief Complaint  Patient presents with   Medical Management of Chronic Issues         Chronic constipation: Coronary artery disease involving native coronary artery of native without angina pectoris:   Aortic calcification:     HPI:  He is a 89 year old long term resident of this facility being seen for the management of his illnesses: Chronic constipation: Coronary artery disease involving native coronary artery of native without angina pectoris:   Aortic calcification. There are no reports of uncontrolled pain. His weight is without change. There are no reports of depressive thoughts.   Past Medical History:  Diagnosis Date   Coronary artery disease    Hyperlipidemia    Obesity    Penile cancer (HCC)    Swelling of extremity    Left Leg    Past Surgical History:  Procedure Laterality Date   APPENDECTOMY     BACK SURGERY     BIOPSY  07/22/2021   Procedure: BIOPSY;  Surgeon: Lynann Bologna, MD;  Location: [facility] ENDOSCOPY;  Service: Gastroenterology;;   CHOLECYSTECTOMY     CORONARY ANGIOPLASTY  11/2005   RCA PCI AND STENTING WITH A CYPHER DES   CORONARY ARTERY BYPASS GRAFT     ESOPHAGOGASTRODUODENOSCOPY (EGD) WITH PROPOFOL N/A 07/22/2021   Procedure: ESOPHAGOGASTRODUODENOSCOPY (EGD) WITH PROPOFOL;  Surgeon: Lynann Bologna, MD;  Location: [facility] ENDOSCOPY;  Service: Gastroenterology;  Laterality: N/A;   HIATAL HERNIA REPAIR     lymph node removal     NM MYOCAR MULTIPLE W/SPECT  11/26/2009   EF 62%. NORMAL MYOCARDIAL PERFUSION STUDY.   TRANSTHORACIC ECHOCARDIOGRAM  12/22/2005   MILD TO MOD AORTIC SCLEROSIS W/O STENOSIS. MILD TO MODERATE MITRAL CALCIFICATION. LA- MILDLY DILATED.    Social History   Socioeconomic History   Marital status: Divorced    Spouse name: Not on file   Number of children: Not on file   Years of education: Not on  file   Highest education level: Not on file  Occupational History   Occupation: retired    Associate Professor: RETIRED  Tobacco Use   Smoking status: Former    Current packs/day: 0.00    Types: Cigarettes    Quit date: 07/14/1983    Years since quitting: 40.5   Smokeless tobacco: Never  Vaping Use   Vaping status: Never Used  Substance and Sexual Activity   Alcohol use: No   Drug use: No   Sexual activity: Not on file  Other Topics Concern   Not on file  Social History Narrative   Not on file   Social Drivers of Health   Financial Resource Strain: Not on file  Food Insecurity: No Food Insecurity (08/29/2023)   Hunger Vital Sign    Worried About Running Out of Food in the Last Year: Never true    Ran Out of Food in the Last Year: Never true  Transportation Needs: No Transportation Needs (08/29/2023)   PRAPARE - Administrator, Civil Service (Medical): No    Lack of Transportation (Non-Medical): No  Physical Activity: Not on file  Stress: Not on file  Social Connections: Not on file  Intimate Partner Violence: Not At Risk (08/29/2023)   Humiliation, Afraid, Rape, and Kick questionnaire    Fear of Current or Ex-Partner: No    Emotionally Abused: No    Physically  Abused: No    Sexually Abused: No   Family History  Problem Relation Age of Onset   Cancer Father    Cancer Sister    Cancer Brother    Cancer Sister    Cancer Other       VITAL SIGNS BP 131/70   Pulse 77   Temp (!) 97.5 F (36.4 C)   Resp 18   Ht 5' 10.5" (1.791 m)   Wt 250 lb 12.8 oz (113.8 kg)   SpO2 98%   BMI 35.48 kg/m   Outpatient Encounter Medications as of 01/27/2024  Medication Sig   acetaminophen (TYLENOL) 325 MG tablet Take 650 mg by mouth every 6 (six) hours as needed for fever or moderate pain (pain score 4-6).   ELIQUIS 2.5 MG TABS tablet Take 2.5 mg by mouth 2 (two) times daily.   magnesium hydroxide (MILK OF MAGNESIA) 400 MG/5ML suspension Take 30 mLs by mouth daily as needed for  mild constipation.   miconazole (MICOTIN) 2 % powder Apply topically in the morning and at bedtime.   Multiple Vitamins-Minerals (PRESERVISION AREDS 2) CAPS Take 1 tablet by mouth daily.   sennosides-docusate sodium (SENOKOT-S) 8.6-50 MG tablet Take 1 tablet by mouth daily.   sodium chloride 1 g tablet Take 1 tablet (1 g total) by mouth 2 (two) times daily with a meal.   spironolactone (ALDACTONE) 25 MG tablet Take 0.5 tablets (12.5 mg total) by mouth daily.   nystatin (MYCOSTATIN/NYSTOP) powder Apply topically 3 (three) times daily. (Patient not taking: Reported on 01/27/2024)   No facility-administered encounter medications on file as of 01/27/2024.     SIGNIFICANT DIAGNOSTIC EXAMS  08-29-23: wbc 7.8; hgb 11.0; hct 32.0; mcv 94.1 plt 184; glucose 127; bun 40; creat 1.67; k+ 2.7; na++ 122; ca 8.8; gfr 37; protein 6.5 albumin 3.8; mag 3.8; tsh 2.133 09-01-23: glucose 120; bun 43; creat 1.97; k+ 3.9; na++ 132; ca 8.9; gfr 31 09-09-23: glucose 86; bun 37; creat 1.52; k+ 4.5; na++ 137; ca 8.8; gfr 42 09-23-23: d-dimer: 0.65 10-12-23: wbc 6.7; hgb 10.2; hct 32.5; mcv 102.2 plt 171; glucose 84; bun 26; creat 1.45; k+ 4.7; na++ 136; ca 9.0; gfr 44; d-dimer 0.71    TODAY  12-24-23: glucose 117; bun 34; creat 1.56; k+ 4.2; na++ 137; ca 9.3 gfr 40 12-30-23: glucose 79; bun 28; creat 1.45; k+ 4.2; na++ 137; ca 9.0; gfr 44   Review of Systems  Constitutional:  Negative for malaise/fatigue.  Respiratory:  Negative for cough and shortness of breath.   Cardiovascular:  Negative for chest pain, palpitations and leg swelling.  Gastrointestinal:  Negative for abdominal pain, constipation and heartburn.  Musculoskeletal:  Negative for back pain, joint pain and myalgias.  Skin: Negative.   Neurological:  Negative for dizziness.  Psychiatric/Behavioral:  The patient is not nervous/anxious.    Physical Exam Constitutional:      General: He is not in acute distress.    Appearance: He is well-developed. He is  obese. He is not diaphoretic.  Neck:     Thyroid: No thyromegaly.  Cardiovascular:     Rate and Rhythm: Normal rate and regular rhythm.     Heart sounds: Normal heart sounds.  Pulmonary:     Effort: Pulmonary effort is normal. No respiratory distress.     Breath sounds: Normal breath sounds.  Abdominal:     General: Bowel sounds are normal. There is no distension.     Palpations: Abdomen is soft.  Tenderness: There is no abdominal tenderness.  Musculoskeletal:        General: Normal range of motion.     Cervical back: Neck supple.     Right lower leg: No edema.     Left lower leg: No edema.  Lymphadenopathy:     Cervical: No cervical adenopathy.  Skin:    General: Skin is warm and dry.  Neurological:     Mental Status: He is alert. Mental status is at baseline.  Psychiatric:        Mood and Affect: Mood normal.       ASSESSMENT/ PLAN:  TODAY  Chronic constipation: will continue miralax daily   2. Coronary artery disease involving native coronary artery of native without angina pectoris: will monitor  3. Aortic calcification: (ct 02-17-16)   PREVIOUS   4. SIADH (syndrome of inappropriate ADH production)/hyponatremia na++ 137; is on NACL 1 gm twice daily; and is on fluid restriction  5. Paroxsymal atrial fibrillation: heart rate is stable is off lopressor due to soft blood pressure readings.    6. Peripheral vascular disease: will monitor  7. Neurocognitive deficits/vascular dementia without behavioral disturbance: CT 08-29-23 demonstrates: atrophy with chronic small vessel white matter ischemic disease; SLUMS 8/30.   8. Stage 3b chronic  kidney disease (CKD): bun 28; creat 1.45 gfr 44  9. History of penile cancer/neurogenic bladder: has long term foley.     Synthia Innocent NP [facility]   call [phone]

## 2024-01-27 NOTE — Telephone Encounter (Signed)
Sharee Holster, NP response:  If the appointment is to change a catheter then no he will not need to be seen   Spoke with patient's son this morning saying that he will cancel the Urology appointment with Dr. Wilkie Aye.   Message sent to Sharee Holster, NP

## 2024-01-30 ENCOUNTER — Encounter (HOSPITAL_COMMUNITY)
Admission: RE | Admit: 2024-01-30 | Discharge: 2024-01-30 | Disposition: A | Discharge status: Home / Self Care | Payer: Medicare Other | Source: Skilled Nursing Facility | Attending: Adult Health | Admitting: Adult Health

## 2024-01-30 DIAGNOSIS — I129 Hypertensive chronic kidney disease with stage 1 through stage 4 chronic kidney disease, or unspecified chronic kidney disease: Secondary | ICD-10-CM | POA: Diagnosis present

## 2024-01-30 DIAGNOSIS — N319 Neuromuscular dysfunction of bladder, unspecified: Secondary | ICD-10-CM | POA: Diagnosis not present

## 2024-01-30 DIAGNOSIS — Z978 Presence of other specified devices: Secondary | ICD-10-CM | POA: Diagnosis not present

## 2024-01-30 DIAGNOSIS — R0682 Tachypnea, not elsewhere classified: Secondary | ICD-10-CM | POA: Diagnosis not present

## 2024-01-30 DIAGNOSIS — R339 Retention of urine, unspecified: Secondary | ICD-10-CM | POA: Diagnosis not present

## 2024-01-30 DIAGNOSIS — I959 Hypotension, unspecified: Secondary | ICD-10-CM | POA: Diagnosis not present

## 2024-01-30 DIAGNOSIS — K219 Gastro-esophageal reflux disease without esophagitis: Secondary | ICD-10-CM | POA: Diagnosis not present

## 2024-01-30 DIAGNOSIS — Z1152 Encounter for screening for COVID-19: Secondary | ICD-10-CM | POA: Diagnosis not present

## 2024-01-30 DIAGNOSIS — Y711 Therapeutic (nonsurgical) and rehabilitative cardiovascular devices associated with adverse incidents: Secondary | ICD-10-CM | POA: Diagnosis present

## 2024-01-30 DIAGNOSIS — R748 Abnormal levels of other serum enzymes: Secondary | ICD-10-CM | POA: Diagnosis present

## 2024-01-30 DIAGNOSIS — K5909 Other constipation: Secondary | ICD-10-CM | POA: Diagnosis present

## 2024-01-30 DIAGNOSIS — R600 Localized edema: Secondary | ICD-10-CM | POA: Diagnosis not present

## 2024-01-30 DIAGNOSIS — A401 Sepsis due to streptococcus, group B: Secondary | ICD-10-CM | POA: Diagnosis not present

## 2024-01-30 DIAGNOSIS — D72829 Elevated white blood cell count, unspecified: Secondary | ICD-10-CM | POA: Diagnosis not present

## 2024-01-30 DIAGNOSIS — D6859 Other primary thrombophilia: Secondary | ICD-10-CM | POA: Diagnosis present

## 2024-01-30 DIAGNOSIS — I70249 Atherosclerosis of native arteries of left leg with ulceration of unspecified site: Secondary | ICD-10-CM | POA: Diagnosis present

## 2024-01-30 DIAGNOSIS — N1832 Chronic kidney disease, stage 3b: Secondary | ICD-10-CM | POA: Insufficient documentation

## 2024-01-30 DIAGNOSIS — I4510 Unspecified right bundle-branch block: Secondary | ICD-10-CM | POA: Diagnosis present

## 2024-01-30 DIAGNOSIS — E669 Obesity, unspecified: Secondary | ICD-10-CM | POA: Diagnosis present

## 2024-01-30 DIAGNOSIS — I480 Paroxysmal atrial fibrillation: Secondary | ICD-10-CM | POA: Diagnosis not present

## 2024-01-30 DIAGNOSIS — R262 Difficulty in walking, not elsewhere classified: Secondary | ICD-10-CM | POA: Diagnosis not present

## 2024-01-30 DIAGNOSIS — R000 Tachycardia, unspecified: Secondary | ICD-10-CM | POA: Diagnosis not present

## 2024-01-30 DIAGNOSIS — R627 Adult failure to thrive: Secondary | ICD-10-CM | POA: Diagnosis present

## 2024-01-30 DIAGNOSIS — F0154 Vascular dementia, unspecified severity, with anxiety: Secondary | ICD-10-CM | POA: Diagnosis present

## 2024-01-30 DIAGNOSIS — R29898 Other symptoms and signs involving the musculoskeletal system: Secondary | ICD-10-CM | POA: Diagnosis not present

## 2024-01-30 DIAGNOSIS — E785 Hyperlipidemia, unspecified: Secondary | ICD-10-CM | POA: Diagnosis present

## 2024-01-30 DIAGNOSIS — N179 Acute kidney failure, unspecified: Secondary | ICD-10-CM | POA: Diagnosis not present

## 2024-01-30 DIAGNOSIS — M6281 Muscle weakness (generalized): Secondary | ICD-10-CM | POA: Diagnosis not present

## 2024-01-30 DIAGNOSIS — Z66 Do not resuscitate: Secondary | ICD-10-CM | POA: Diagnosis not present

## 2024-01-30 DIAGNOSIS — R29818 Other symptoms and signs involving the nervous system: Secondary | ICD-10-CM | POA: Diagnosis not present

## 2024-01-30 DIAGNOSIS — R488 Other symbolic dysfunctions: Secondary | ICD-10-CM | POA: Diagnosis not present

## 2024-01-30 DIAGNOSIS — E871 Hypo-osmolality and hyponatremia: Secondary | ICD-10-CM | POA: Diagnosis present

## 2024-01-30 DIAGNOSIS — I872 Venous insufficiency (chronic) (peripheral): Secondary | ICD-10-CM | POA: Diagnosis not present

## 2024-01-30 DIAGNOSIS — R7881 Bacteremia: Secondary | ICD-10-CM | POA: Diagnosis not present

## 2024-01-30 DIAGNOSIS — I4891 Unspecified atrial fibrillation: Secondary | ICD-10-CM | POA: Diagnosis not present

## 2024-01-30 DIAGNOSIS — Z8549 Personal history of malignant neoplasm of other male genital organs: Secondary | ICD-10-CM | POA: Diagnosis not present

## 2024-01-30 DIAGNOSIS — F0150 Vascular dementia without behavioral disturbance: Secondary | ICD-10-CM | POA: Diagnosis not present

## 2024-01-30 DIAGNOSIS — I2510 Atherosclerotic heart disease of native coronary artery without angina pectoris: Secondary | ICD-10-CM | POA: Diagnosis present

## 2024-01-30 DIAGNOSIS — R4189 Other symptoms and signs involving cognitive functions and awareness: Secondary | ICD-10-CM | POA: Diagnosis not present

## 2024-01-30 DIAGNOSIS — I499 Cardiac arrhythmia, unspecified: Secondary | ICD-10-CM | POA: Diagnosis not present

## 2024-01-30 DIAGNOSIS — N183 Chronic kidney disease, stage 3 unspecified: Secondary | ICD-10-CM | POA: Diagnosis not present

## 2024-01-30 DIAGNOSIS — D6869 Other thrombophilia: Secondary | ICD-10-CM | POA: Diagnosis not present

## 2024-01-30 DIAGNOSIS — T80211A Bloodstream infection due to central venous catheter, initial encounter: Secondary | ICD-10-CM | POA: Diagnosis present

## 2024-01-30 DIAGNOSIS — Z0389 Encounter for observation for other suspected diseases and conditions ruled out: Secondary | ICD-10-CM | POA: Diagnosis not present

## 2024-01-30 DIAGNOSIS — L03115 Cellulitis of right lower limb: Secondary | ICD-10-CM | POA: Diagnosis not present

## 2024-01-30 DIAGNOSIS — R0989 Other specified symptoms and signs involving the circulatory and respiratory systems: Secondary | ICD-10-CM | POA: Diagnosis not present

## 2024-01-30 DIAGNOSIS — I4819 Other persistent atrial fibrillation: Secondary | ICD-10-CM | POA: Diagnosis present

## 2024-01-30 DIAGNOSIS — J9811 Atelectasis: Secondary | ICD-10-CM | POA: Diagnosis not present

## 2024-01-30 DIAGNOSIS — S81802D Unspecified open wound, left lower leg, subsequent encounter: Secondary | ICD-10-CM | POA: Diagnosis not present

## 2024-01-30 DIAGNOSIS — R1312 Dysphagia, oropharyngeal phase: Secondary | ICD-10-CM | POA: Diagnosis not present

## 2024-01-30 DIAGNOSIS — R410 Disorientation, unspecified: Secondary | ICD-10-CM | POA: Diagnosis not present

## 2024-01-30 DIAGNOSIS — L89620 Pressure ulcer of left heel, unstageable: Secondary | ICD-10-CM | POA: Diagnosis present

## 2024-01-30 DIAGNOSIS — Q791 Other congenital malformations of diaphragm: Secondary | ICD-10-CM | POA: Diagnosis not present

## 2024-01-30 DIAGNOSIS — R9389 Abnormal findings on diagnostic imaging of other specified body structures: Secondary | ICD-10-CM | POA: Diagnosis not present

## 2024-01-30 DIAGNOSIS — E872 Acidosis, unspecified: Secondary | ICD-10-CM | POA: Diagnosis present

## 2024-01-30 DIAGNOSIS — L97822 Non-pressure chronic ulcer of other part of left lower leg with fat layer exposed: Secondary | ICD-10-CM | POA: Diagnosis present

## 2024-01-30 DIAGNOSIS — R6520 Severe sepsis without septic shock: Secondary | ICD-10-CM | POA: Diagnosis not present

## 2024-01-30 DIAGNOSIS — A419 Sepsis, unspecified organism: Secondary | ICD-10-CM | POA: Diagnosis not present

## 2024-01-30 DIAGNOSIS — Y846 Urinary catheterization as the cause of abnormal reaction of the patient, or of later complication, without mention of misadventure at the time of the procedure: Secondary | ICD-10-CM | POA: Diagnosis present

## 2024-01-30 DIAGNOSIS — I739 Peripheral vascular disease, unspecified: Secondary | ICD-10-CM | POA: Diagnosis not present

## 2024-01-30 DIAGNOSIS — D72828 Other elevated white blood cell count: Secondary | ICD-10-CM | POA: Diagnosis not present

## 2024-01-30 DIAGNOSIS — E222 Syndrome of inappropriate secretion of antidiuretic hormone: Secondary | ICD-10-CM | POA: Diagnosis not present

## 2024-01-30 DIAGNOSIS — H353211 Exudative age-related macular degeneration, right eye, with active choroidal neovascularization: Secondary | ICD-10-CM | POA: Diagnosis not present

## 2024-01-30 DIAGNOSIS — L03116 Cellulitis of left lower limb: Secondary | ICD-10-CM | POA: Diagnosis not present

## 2024-01-30 LAB — RESP PANEL BY RT-PCR (RSV, FLU A&B, COVID)  RVPGX2
Influenza A by PCR: NEGATIVE
Influenza B by PCR: NEGATIVE
Resp Syncytial Virus by PCR: NEGATIVE
SARS Coronavirus 2 by RT PCR: NEGATIVE

## 2024-01-31 ENCOUNTER — Encounter: Payer: Self-pay | Admitting: Adult Health

## 2024-01-31 ENCOUNTER — Non-Acute Institutional Stay (SKILLED_NURSING_FACILITY): Payer: Self-pay | Admitting: Adult Health

## 2024-01-31 ENCOUNTER — Other Ambulatory Visit (HOSPITAL_COMMUNITY)
Admission: RE | Admit: 2024-01-31 | Discharge: 2024-01-31 | Disposition: A | Discharge status: Home / Self Care | Payer: Medicare Other | Source: Skilled Nursing Facility | Attending: Adult Health | Admitting: Adult Health

## 2024-01-31 DIAGNOSIS — R051 Acute cough: Secondary | ICD-10-CM | POA: Insufficient documentation

## 2024-01-31 DIAGNOSIS — L03115 Cellulitis of right lower limb: Secondary | ICD-10-CM | POA: Diagnosis not present

## 2024-01-31 DIAGNOSIS — Q791 Other congenital malformations of diaphragm: Secondary | ICD-10-CM | POA: Diagnosis not present

## 2024-01-31 DIAGNOSIS — R4189 Other symptoms and signs involving cognitive functions and awareness: Secondary | ICD-10-CM

## 2024-01-31 DIAGNOSIS — R29818 Other symptoms and signs involving the nervous system: Secondary | ICD-10-CM | POA: Diagnosis not present

## 2024-01-31 DIAGNOSIS — D72829 Elevated white blood cell count, unspecified: Secondary | ICD-10-CM | POA: Diagnosis not present

## 2024-01-31 DIAGNOSIS — J9811 Atelectasis: Secondary | ICD-10-CM | POA: Diagnosis not present

## 2024-01-31 LAB — CBC WITH DIFFERENTIAL/PLATELET
Abs Immature Granulocytes: 0.2 10*3/uL — ABNORMAL HIGH (ref 0.00–0.07)
Band Neutrophils: 5 %
Basophils Absolute: 0 10*3/uL (ref 0.0–0.1)
Basophils Relative: 0 %
Eosinophils Absolute: 0 10*3/uL (ref 0.0–0.5)
Eosinophils Relative: 0 %
HCT: 35.8 % — ABNORMAL LOW (ref 39.0–52.0)
Hemoglobin: 11.3 g/dL — ABNORMAL LOW (ref 13.0–17.0)
Lymphocytes Relative: 1 %
Lymphs Abs: 0.2 10*3/uL — ABNORMAL LOW (ref 0.7–4.0)
MCH: 31.7 pg (ref 26.0–34.0)
MCHC: 31.6 g/dL (ref 30.0–36.0)
MCV: 100.6 fL — ABNORMAL HIGH (ref 80.0–100.0)
Metamyelocytes Relative: 1 %
Monocytes Absolute: 0.2 10*3/uL (ref 0.1–1.0)
Monocytes Relative: 1 %
Neutro Abs: 18.4 10*3/uL — ABNORMAL HIGH (ref 1.7–7.7)
Neutrophils Relative %: 92 %
Platelets: 152 10*3/uL (ref 150–400)
RBC: 3.56 MIL/uL — ABNORMAL LOW (ref 4.22–5.81)
RDW: 16.6 % — ABNORMAL HIGH (ref 11.5–15.5)
WBC: 19 10*3/uL — ABNORMAL HIGH (ref 4.0–10.5)
nRBC: 0 % (ref 0.0–0.2)

## 2024-01-31 LAB — BASIC METABOLIC PANEL
Anion gap: 14 (ref 5–15)
BUN: 38 mg/dL — ABNORMAL HIGH (ref 8–23)
CO2: 20 mmol/L — ABNORMAL LOW (ref 22–32)
Calcium: 9 mg/dL (ref 8.9–10.3)
Chloride: 102 mmol/L (ref 98–111)
Creatinine, Ser: 1.96 mg/dL — ABNORMAL HIGH (ref 0.61–1.24)
GFR, Estimated: 31 mL/min — ABNORMAL LOW (ref >60)
Glucose, Bld: 129 mg/dL — ABNORMAL HIGH (ref 70–99)
Potassium: 4.9 mmol/L (ref 3.5–5.1)
Sodium: 136 mmol/L (ref 135–145)

## 2024-01-31 NOTE — Progress Notes (Signed)
Location:  [facility] Room Number: 140 Place of Service:  SNF (31)   CODE STATUS: dnr   No Known Allergies  Chief Complaint  Patient presents with   Acute Visit    fever    HPI:  He had some upper respiratory symptoms over the past weekend with a low grade temp. His respiratory panel came back negative. His wbc is 19.3.  His right lower extremity is red hot with worsening edema. He is on long term eliquis therapy.  I have discussed his overall status with his family and the MOST form has been filled out.   Past Medical History:  Diagnosis Date   Coronary artery disease    Hyperlipidemia    Obesity    Penile cancer (HCC)    Swelling of extremity    Left Leg    Past Surgical History:  Procedure Laterality Date   APPENDECTOMY     BACK SURGERY     BIOPSY  07/22/2021   Procedure: BIOPSY;  Surgeon: Lynann Bologna, MD;  Location: [facility] ENDOSCOPY;  Service: Gastroenterology;;   CHOLECYSTECTOMY     CORONARY ANGIOPLASTY  11/2005   RCA PCI AND STENTING WITH A CYPHER DES   CORONARY ARTERY BYPASS GRAFT     ESOPHAGOGASTRODUODENOSCOPY (EGD) WITH PROPOFOL N/A 07/22/2021   Procedure: ESOPHAGOGASTRODUODENOSCOPY (EGD) WITH PROPOFOL;  Surgeon: Lynann Bologna, MD;  Location: [facility] ENDOSCOPY;  Service: Gastroenterology;  Laterality: N/A;   HIATAL HERNIA REPAIR     lymph node removal     NM MYOCAR MULTIPLE W/SPECT  11/26/2009   EF 62%. NORMAL MYOCARDIAL PERFUSION STUDY.   TRANSTHORACIC ECHOCARDIOGRAM  12/22/2005   MILD TO MOD AORTIC SCLEROSIS W/O STENOSIS. MILD TO MODERATE MITRAL CALCIFICATION. LA- MILDLY DILATED.    Social History   Socioeconomic History   Marital status: Divorced    Spouse name: Not on file   Number of children: Not on file   Years of education: Not on file   Highest education level: Not on file  Occupational History   Occupation: retired    Associate Professor: RETIRED  Tobacco Use   Smoking status: Former    Current packs/day: 0.00    Types:  Cigarettes    Quit date: 07/14/1983    Years since quitting: 40.5   Smokeless tobacco: Never  Vaping Use   Vaping status: Never Used  Substance and Sexual Activity   Alcohol use: No   Drug use: No   Sexual activity: Not on file  Other Topics Concern   Not on file  Social History Narrative   Not on file   Social Drivers of Health   Financial Resource Strain: Not on file  Food Insecurity: No Food Insecurity (08/29/2023)   Hunger Vital Sign    Worried About Running Out of Food in the Last Year: Never true    Ran Out of Food in the Last Year: Never true  Transportation Needs: No Transportation Needs (08/29/2023)   PRAPARE - Administrator, Civil Service (Medical): No    Lack of Transportation (Non-Medical): No  Physical Activity: Not on file  Stress: Not on file  Social Connections: Not on file  Intimate Partner Violence: Not At Risk (08/29/2023)   Humiliation, Afraid, Rape, and Kick questionnaire    Fear of Current or Ex-Partner: No    Emotionally Abused: No    Physically Abused: No    Sexually Abused: No   Family History  Problem Relation Age of Onset   Cancer  Father    Cancer Sister    Cancer Brother    Cancer Sister    Cancer Other       VITAL SIGNS BP 105/66   Pulse 88   Temp 100.1 F (37.8 C)   Ht 5' 10.5" (1.791 m)   Wt 250 lb 12.8 oz (113.8 kg)   SpO2 (!) 20%   PF 95 L/min   BMI 35.48 kg/m   Outpatient Encounter Medications as of 01/31/2024  Medication Sig   acetaminophen (TYLENOL) 325 MG tablet Take 650 mg by mouth every 6 (six) hours as needed for fever or moderate pain (pain score 4-6).   ELIQUIS 2.5 MG TABS tablet Take 2.5 mg by mouth 2 (two) times daily.   magnesium hydroxide (MILK OF MAGNESIA) 400 MG/5ML suspension Take 30 mLs by mouth daily as needed for mild constipation.   miconazole (MICOTIN) 2 % powder Apply topically in the morning and at bedtime.   Multiple Vitamins-Minerals (PRESERVISION AREDS 2) CAPS Take 1 tablet by mouth daily.    nystatin (MYCOSTATIN/NYSTOP) powder Apply topically 3 (three) times daily. (Patient not taking: Reported on 01/27/2024)   sennosides-docusate sodium (SENOKOT-S) 8.6-50 MG tablet Take 1 tablet by mouth daily.   sodium chloride 1 g tablet Take 1 tablet (1 g total) by mouth 2 (two) times daily with a meal.   spironolactone (ALDACTONE) 25 MG tablet Take 0.5 tablets (12.5 mg total) by mouth daily.   No facility-administered encounter medications on file as of 01/31/2024.     SIGNIFICANT DIAGNOSTIC EXAMS  08-29-23: wbc 7.8; hgb 11.0; hct 32.0; mcv 94.1 plt 184; glucose 127; bun 40; creat 1.67; k+ 2.7; na++ 122; ca 8.8; gfr 37; protein 6.5 albumin 3.8; mag 3.8; tsh 2.133 09-01-23: glucose 120; bun 43; creat 1.97; k+ 3.9; na++ 132; ca 8.9; gfr 31 09-09-23: glucose 86; bun 37; creat 1.52; k+ 4.5; na++ 137; ca 8.8; gfr 42 09-23-23: d-dimer: 0.65 10-12-23: wbc 6.7; hgb 10.2; hct 32.5; mcv 102.2 plt 171; glucose 84; bun 26; creat 1.45; k+ 4.7; na++ 136; ca 9.0; gfr 44; d-dimer 0.71    TODAY  12-24-23: glucose 117; bun 34; creat 1.56; k+ 4.2; na++ 137; ca 9.3 gfr 40 12-30-23: glucose 79; bun 28; creat 1.45; k+ 4.2; na++ 137; ca 9.0; gfr 44 01-31-24: wbc 19.3; hgb 11.3; hct 35.8; mcv 100.6 plt 152; glucose 129 bun 38; creat 1.96; k+ 4.9; na++ 136; ca 9.0 gfr 31    Review of Systems  Unable to perform ROS: Medical condition   Physical Exam Constitutional:      General: He is not in acute distress.    Appearance: He is well-developed. He is obese. He is not diaphoretic.  Neck:     Thyroid: No thyromegaly.  Cardiovascular:     Rate and Rhythm: Normal rate. Rhythm irregular.     Heart sounds: Murmur heard.     Comments: Unable to palpate pedal pulses due to edema.  Pulmonary:     Effort: Pulmonary effort is normal. No respiratory distress.     Breath sounds: Normal breath sounds.  Abdominal:     General: Bowel sounds are normal. There is no distension.     Palpations: Abdomen is soft.     Tenderness:  There is no abdominal tenderness.  Genitourinary:    Comments: foley Musculoskeletal:        General: Normal range of motion.     Cervical back: Neck supple.     Comments: Gross right lower extremity edema  Left leg edema present.   Lymphadenopathy:     Cervical: No cervical adenopathy.  Skin:    General: Skin is warm and dry.     Comments: Right lower extremity: red hot dry scaly skin from toes to thigh Left lower extremity: red hot on calf area   Neurological:     Mental Status: He is alert.     Comments: Is confused and disoriented   Psychiatric:        Mood and Affect: Mood normal.     ASSESSMENT/ PLAN:  TODAY  Cellulitis right lower extremity: will get blood cultures X 2 sites; will begin zosyn 2.25 gm every 6 hours through 02-07-24  MOST form filled out ( 20 minutes)    Synthia Innocent NP [facility]  call [phone]

## 2024-02-01 ENCOUNTER — Encounter (HOSPITAL_COMMUNITY): Payer: Self-pay

## 2024-02-01 ENCOUNTER — Inpatient Hospital Stay (HOSPITAL_COMMUNITY)
Admission: EM | Admit: 2024-02-01 | Discharge: 2024-02-05 | DRG: 314 | Disposition: A | Discharge status: Skilled Nursing Facility | Payer: Medicare Other | Source: Skilled Nursing Facility | Attending: Internal Medicine | Admitting: Internal Medicine

## 2024-02-01 ENCOUNTER — Emergency Department (HOSPITAL_COMMUNITY): Payer: Medicare Other

## 2024-02-01 ENCOUNTER — Other Ambulatory Visit: Payer: Self-pay

## 2024-02-01 DIAGNOSIS — S81802D Unspecified open wound, left lower leg, subsequent encounter: Secondary | ICD-10-CM | POA: Diagnosis not present

## 2024-02-01 DIAGNOSIS — N179 Acute kidney failure, unspecified: Secondary | ICD-10-CM

## 2024-02-01 DIAGNOSIS — R339 Retention of urine, unspecified: Secondary | ICD-10-CM | POA: Diagnosis not present

## 2024-02-01 DIAGNOSIS — R6520 Severe sepsis without septic shock: Secondary | ICD-10-CM | POA: Diagnosis present

## 2024-02-01 DIAGNOSIS — I2510 Atherosclerotic heart disease of native coronary artery without angina pectoris: Secondary | ICD-10-CM | POA: Diagnosis present

## 2024-02-01 DIAGNOSIS — R8281 Pyuria: Secondary | ICD-10-CM | POA: Diagnosis present

## 2024-02-01 DIAGNOSIS — Z978 Presence of other specified devices: Secondary | ICD-10-CM | POA: Diagnosis not present

## 2024-02-01 DIAGNOSIS — Z9079 Acquired absence of other genital organ(s): Secondary | ICD-10-CM

## 2024-02-01 DIAGNOSIS — E222 Syndrome of inappropriate secretion of antidiuretic hormone: Secondary | ICD-10-CM | POA: Diagnosis present

## 2024-02-01 DIAGNOSIS — R7881 Bacteremia: Secondary | ICD-10-CM

## 2024-02-01 DIAGNOSIS — E871 Hypo-osmolality and hyponatremia: Secondary | ICD-10-CM | POA: Diagnosis present

## 2024-02-01 DIAGNOSIS — E785 Hyperlipidemia, unspecified: Secondary | ICD-10-CM | POA: Diagnosis present

## 2024-02-01 DIAGNOSIS — I739 Peripheral vascular disease, unspecified: Secondary | ICD-10-CM | POA: Diagnosis present

## 2024-02-01 DIAGNOSIS — D72829 Elevated white blood cell count, unspecified: Secondary | ICD-10-CM | POA: Diagnosis present

## 2024-02-01 DIAGNOSIS — A419 Sepsis, unspecified organism: Secondary | ICD-10-CM | POA: Diagnosis present

## 2024-02-01 DIAGNOSIS — Y846 Urinary catheterization as the cause of abnormal reaction of the patient, or of later complication, without mention of misadventure at the time of the procedure: Secondary | ICD-10-CM | POA: Diagnosis present

## 2024-02-01 DIAGNOSIS — I480 Paroxysmal atrial fibrillation: Secondary | ICD-10-CM

## 2024-02-01 DIAGNOSIS — Y711 Therapeutic (nonsurgical) and rehabilitative cardiovascular devices associated with adverse incidents: Secondary | ICD-10-CM | POA: Diagnosis present

## 2024-02-01 DIAGNOSIS — I4891 Unspecified atrial fibrillation: Principal | ICD-10-CM | POA: Diagnosis present

## 2024-02-01 DIAGNOSIS — L02415 Cutaneous abscess of right lower limb: Secondary | ICD-10-CM

## 2024-02-01 DIAGNOSIS — Z8549 Personal history of malignant neoplasm of other male genital organs: Secondary | ICD-10-CM | POA: Diagnosis not present

## 2024-02-01 DIAGNOSIS — Z9049 Acquired absence of other specified parts of digestive tract: Secondary | ICD-10-CM

## 2024-02-01 DIAGNOSIS — Z7901 Long term (current) use of anticoagulants: Secondary | ICD-10-CM

## 2024-02-01 DIAGNOSIS — I129 Hypertensive chronic kidney disease with stage 1 through stage 4 chronic kidney disease, or unspecified chronic kidney disease: Secondary | ICD-10-CM | POA: Diagnosis present

## 2024-02-01 DIAGNOSIS — R9389 Abnormal findings on diagnostic imaging of other specified body structures: Secondary | ICD-10-CM | POA: Diagnosis not present

## 2024-02-01 DIAGNOSIS — N319 Neuromuscular dysfunction of bladder, unspecified: Secondary | ICD-10-CM | POA: Diagnosis present

## 2024-02-01 DIAGNOSIS — R011 Cardiac murmur, unspecified: Secondary | ICD-10-CM | POA: Diagnosis present

## 2024-02-01 DIAGNOSIS — F01518 Vascular dementia, unspecified severity, with other behavioral disturbance: Secondary | ICD-10-CM | POA: Diagnosis present

## 2024-02-01 DIAGNOSIS — N1832 Chronic kidney disease, stage 3b: Secondary | ICD-10-CM | POA: Diagnosis present

## 2024-02-01 DIAGNOSIS — L03116 Cellulitis of left lower limb: Secondary | ICD-10-CM | POA: Diagnosis present

## 2024-02-01 DIAGNOSIS — Z955 Presence of coronary angioplasty implant and graft: Secondary | ICD-10-CM

## 2024-02-01 DIAGNOSIS — H353211 Exudative age-related macular degeneration, right eye, with active choroidal neovascularization: Secondary | ICD-10-CM | POA: Diagnosis not present

## 2024-02-01 DIAGNOSIS — Z951 Presence of aortocoronary bypass graft: Secondary | ICD-10-CM

## 2024-02-01 DIAGNOSIS — L97822 Non-pressure chronic ulcer of other part of left lower leg with fat layer exposed: Secondary | ICD-10-CM | POA: Diagnosis present

## 2024-02-01 DIAGNOSIS — R29898 Other symptoms and signs involving the musculoskeletal system: Secondary | ICD-10-CM | POA: Diagnosis not present

## 2024-02-01 DIAGNOSIS — K5909 Other constipation: Secondary | ICD-10-CM | POA: Diagnosis present

## 2024-02-01 DIAGNOSIS — I87319 Chronic venous hypertension (idiopathic) with ulcer of unspecified lower extremity: Secondary | ICD-10-CM | POA: Diagnosis present

## 2024-02-01 DIAGNOSIS — A401 Sepsis due to streptococcus, group B: Secondary | ICD-10-CM | POA: Diagnosis present

## 2024-02-01 DIAGNOSIS — Z7902 Long term (current) use of antithrombotics/antiplatelets: Secondary | ICD-10-CM

## 2024-02-01 DIAGNOSIS — R262 Difficulty in walking, not elsewhere classified: Secondary | ICD-10-CM | POA: Diagnosis not present

## 2024-02-01 DIAGNOSIS — D72828 Other elevated white blood cell count: Secondary | ICD-10-CM | POA: Diagnosis not present

## 2024-02-01 DIAGNOSIS — F0154 Vascular dementia, unspecified severity, with anxiety: Secondary | ICD-10-CM | POA: Diagnosis present

## 2024-02-01 DIAGNOSIS — Z0389 Encounter for observation for other suspected diseases and conditions ruled out: Secondary | ICD-10-CM | POA: Diagnosis not present

## 2024-02-01 DIAGNOSIS — F0150 Vascular dementia without behavioral disturbance: Secondary | ICD-10-CM | POA: Diagnosis not present

## 2024-02-01 DIAGNOSIS — I959 Hypotension, unspecified: Secondary | ICD-10-CM | POA: Diagnosis present

## 2024-02-01 DIAGNOSIS — Z6836 Body mass index (BMI) 36.0-36.9, adult: Secondary | ICD-10-CM

## 2024-02-01 DIAGNOSIS — R600 Localized edema: Secondary | ICD-10-CM | POA: Diagnosis not present

## 2024-02-01 DIAGNOSIS — D6859 Other primary thrombophilia: Secondary | ICD-10-CM | POA: Diagnosis present

## 2024-02-01 DIAGNOSIS — R410 Disorientation, unspecified: Secondary | ICD-10-CM | POA: Diagnosis not present

## 2024-02-01 DIAGNOSIS — R000 Tachycardia, unspecified: Secondary | ICD-10-CM | POA: Diagnosis present

## 2024-02-01 DIAGNOSIS — L03115 Cellulitis of right lower limb: Secondary | ICD-10-CM | POA: Diagnosis not present

## 2024-02-01 DIAGNOSIS — Z66 Do not resuscitate: Secondary | ICD-10-CM | POA: Diagnosis present

## 2024-02-01 DIAGNOSIS — N183 Chronic kidney disease, stage 3 unspecified: Secondary | ICD-10-CM | POA: Diagnosis not present

## 2024-02-01 DIAGNOSIS — I4510 Unspecified right bundle-branch block: Secondary | ICD-10-CM | POA: Diagnosis present

## 2024-02-01 DIAGNOSIS — I252 Old myocardial infarction: Secondary | ICD-10-CM

## 2024-02-01 DIAGNOSIS — R051 Acute cough: Secondary | ICD-10-CM | POA: Diagnosis present

## 2024-02-01 DIAGNOSIS — R1312 Dysphagia, oropharyngeal phase: Secondary | ICD-10-CM | POA: Diagnosis not present

## 2024-02-01 DIAGNOSIS — I499 Cardiac arrhythmia, unspecified: Secondary | ICD-10-CM | POA: Diagnosis not present

## 2024-02-01 DIAGNOSIS — L89620 Pressure ulcer of left heel, unstageable: Secondary | ICD-10-CM | POA: Diagnosis present

## 2024-02-01 DIAGNOSIS — T80211A Bloodstream infection due to central venous catheter, initial encounter: Secondary | ICD-10-CM | POA: Diagnosis present

## 2024-02-01 DIAGNOSIS — R748 Abnormal levels of other serum enzymes: Secondary | ICD-10-CM | POA: Diagnosis present

## 2024-02-01 DIAGNOSIS — Z1152 Encounter for screening for COVID-19: Secondary | ICD-10-CM | POA: Diagnosis not present

## 2024-02-01 DIAGNOSIS — R0682 Tachypnea, not elsewhere classified: Secondary | ICD-10-CM | POA: Diagnosis not present

## 2024-02-01 DIAGNOSIS — D6869 Other thrombophilia: Secondary | ICD-10-CM | POA: Diagnosis not present

## 2024-02-01 DIAGNOSIS — R6521 Severe sepsis with septic shock: Secondary | ICD-10-CM

## 2024-02-01 DIAGNOSIS — E872 Acidosis, unspecified: Secondary | ICD-10-CM | POA: Diagnosis present

## 2024-02-01 DIAGNOSIS — E669 Obesity, unspecified: Secondary | ICD-10-CM | POA: Diagnosis present

## 2024-02-01 DIAGNOSIS — I872 Venous insufficiency (chronic) (peripheral): Secondary | ICD-10-CM | POA: Diagnosis not present

## 2024-02-01 DIAGNOSIS — R627 Adult failure to thrive: Secondary | ICD-10-CM | POA: Diagnosis present

## 2024-02-01 DIAGNOSIS — R296 Repeated falls: Secondary | ICD-10-CM | POA: Diagnosis present

## 2024-02-01 DIAGNOSIS — R0989 Other specified symptoms and signs involving the circulatory and respiratory systems: Secondary | ICD-10-CM | POA: Diagnosis not present

## 2024-02-01 DIAGNOSIS — Z936 Other artificial openings of urinary tract status: Secondary | ICD-10-CM

## 2024-02-01 DIAGNOSIS — Z87891 Personal history of nicotine dependence: Secondary | ICD-10-CM

## 2024-02-01 DIAGNOSIS — L89621 Pressure ulcer of left heel, stage 1: Secondary | ICD-10-CM | POA: Diagnosis present

## 2024-02-01 DIAGNOSIS — M6281 Muscle weakness (generalized): Secondary | ICD-10-CM | POA: Diagnosis not present

## 2024-02-01 DIAGNOSIS — I4819 Other persistent atrial fibrillation: Secondary | ICD-10-CM | POA: Diagnosis present

## 2024-02-01 DIAGNOSIS — Z79899 Other long term (current) drug therapy: Secondary | ICD-10-CM

## 2024-02-01 DIAGNOSIS — I70249 Atherosclerosis of native arteries of left leg with ulceration of unspecified site: Secondary | ICD-10-CM | POA: Diagnosis present

## 2024-02-01 DIAGNOSIS — R739 Hyperglycemia, unspecified: Secondary | ICD-10-CM | POA: Diagnosis present

## 2024-02-01 DIAGNOSIS — L819 Disorder of pigmentation, unspecified: Secondary | ICD-10-CM

## 2024-02-01 DIAGNOSIS — K219 Gastro-esophageal reflux disease without esophagitis: Secondary | ICD-10-CM | POA: Diagnosis not present

## 2024-02-01 DIAGNOSIS — I70201 Unspecified atherosclerosis of native arteries of extremities, right leg: Secondary | ICD-10-CM | POA: Diagnosis present

## 2024-02-01 DIAGNOSIS — I700 Atherosclerosis of aorta: Secondary | ICD-10-CM | POA: Diagnosis present

## 2024-02-01 DIAGNOSIS — S81802A Unspecified open wound, left lower leg, initial encounter: Secondary | ICD-10-CM | POA: Diagnosis present

## 2024-02-01 DIAGNOSIS — L97909 Non-pressure chronic ulcer of unspecified part of unspecified lower leg with unspecified severity: Secondary | ICD-10-CM | POA: Diagnosis present

## 2024-02-01 DIAGNOSIS — R488 Other symbolic dysfunctions: Secondary | ICD-10-CM | POA: Diagnosis not present

## 2024-02-01 DIAGNOSIS — L0390 Cellulitis, unspecified: Secondary | ICD-10-CM | POA: Diagnosis present

## 2024-02-01 DIAGNOSIS — R29818 Other symptoms and signs involving the nervous system: Secondary | ICD-10-CM | POA: Diagnosis present

## 2024-02-01 HISTORY — DX: Do not resuscitate: Z66

## 2024-02-01 HISTORY — DX: Bacteremia: R78.81

## 2024-02-01 HISTORY — DX: Chronic kidney disease, stage 3b: N17.9

## 2024-02-01 LAB — BLOOD CULTURE ID PANEL (REFLEXED) - BCID2

## 2024-02-01 LAB — LACTIC ACID, PLASMA
Lactic Acid, Venous: 4.1 mmol/L (ref 0.5–1.9)
Lactic Acid, Venous: 3.6 mmol/L (ref 0.5–1.9)
Lactic Acid, Venous: 3.3 mmol/L (ref 0.5–1.9)

## 2024-02-01 LAB — RESP PANEL BY RT-PCR (RSV, FLU A&B, COVID)  RVPGX2
Influenza A by PCR: NEGATIVE
Influenza B by PCR: NEGATIVE
Resp Syncytial Virus by PCR: NEGATIVE
SARS Coronavirus 2 by RT PCR: NEGATIVE

## 2024-02-01 LAB — URINALYSIS, W/ REFLEX TO CULTURE (INFECTION SUSPECTED)
Bacteria, UA: NONE SEEN
Bilirubin Urine: NEGATIVE
Glucose, UA: NEGATIVE mg/dL
Ketones, ur: NEGATIVE mg/dL
Nitrite: NEGATIVE
Protein, ur: 30 mg/dL — AB
Specific Gravity, Urine: 1.024 (ref 1.005–1.030)
pH: 5 (ref 5.0–8.0)

## 2024-02-01 LAB — COMPREHENSIVE METABOLIC PANEL
ALT: 97 U/L — ABNORMAL HIGH (ref 0–44)
AST: 127 U/L — ABNORMAL HIGH (ref 15–41)
Albumin: 3 g/dL — ABNORMAL LOW (ref 3.5–5.0)
Alkaline Phosphatase: 50 U/L (ref 38–126)
Anion gap: 13 (ref 5–15)
BUN: 56 mg/dL — ABNORMAL HIGH (ref 8–23)
CO2: 20 mmol/L — ABNORMAL LOW (ref 22–32)
Calcium: 8.7 mg/dL — ABNORMAL LOW (ref 8.9–10.3)
Chloride: 103 mmol/L (ref 98–111)
Creatinine, Ser: 2.93 mg/dL — ABNORMAL HIGH (ref 0.61–1.24)
GFR, Estimated: 19 mL/min — ABNORMAL LOW (ref >60)
Glucose, Bld: 183 mg/dL — ABNORMAL HIGH (ref 70–99)
Potassium: 4.6 mmol/L (ref 3.5–5.1)
Sodium: 136 mmol/L (ref 135–145)
Total Bilirubin: 0.7 mg/dL (ref 0.0–1.2)
Total Protein: 6.6 g/dL (ref 6.5–8.1)

## 2024-02-01 LAB — CBC WITH DIFFERENTIAL/PLATELET
Abs Immature Granulocytes: 0.6 10*3/uL — ABNORMAL HIGH (ref 0.00–0.07)
Band Neutrophils: 13 %
Basophils Absolute: 0 10*3/uL (ref 0.0–0.1)
Basophils Relative: 0 %
Eosinophils Absolute: 0 10*3/uL (ref 0.0–0.5)
Eosinophils Relative: 0 %
HCT: 32.7 % — ABNORMAL LOW (ref 39.0–52.0)
Hemoglobin: 10.6 g/dL — ABNORMAL LOW (ref 13.0–17.0)
Lymphocytes Relative: 1 %
Lymphs Abs: 0.2 10*3/uL — ABNORMAL LOW (ref 0.7–4.0)
MCH: 32.9 pg (ref 26.0–34.0)
MCHC: 32.4 g/dL (ref 30.0–36.0)
MCV: 101.6 fL — ABNORMAL HIGH (ref 80.0–100.0)
Metamyelocytes Relative: 2 %
Monocytes Absolute: 0.8 10*3/uL (ref 0.1–1.0)
Monocytes Relative: 4 %
Myelocytes: 1 %
Neutro Abs: 17.8 10*3/uL — ABNORMAL HIGH (ref 1.7–7.7)
Neutrophils Relative %: 79 %
Platelets: 128 10*3/uL — ABNORMAL LOW (ref 150–400)
RBC: 3.22 MIL/uL — ABNORMAL LOW (ref 4.22–5.81)
RDW: 17 % — ABNORMAL HIGH (ref 11.5–15.5)
Smear Review: DECREASED
WBC: 19.3 10*3/uL — ABNORMAL HIGH (ref 4.0–10.5)
nRBC: 0 % (ref 0.0–0.2)

## 2024-02-01 LAB — MRSA NEXT GEN BY PCR, NASAL: MRSA by PCR Next Gen: NOT DETECTED

## 2024-02-01 MED ORDER — ONDANSETRON HCL 4 MG/2ML IJ SOLN: 4.0000 mg | Freq: Four times a day (QID) | INTRAMUSCULAR | Status: DC | PRN

## 2024-02-01 MED ORDER — PANTOPRAZOLE SODIUM 40 MG IV SOLR
40.0000 mg | INTRAVENOUS | Status: DC
  Administered 2024-02-01 – 2024-02-05 (×5): 40 mg via INTRAVENOUS
  Filled 2024-02-01 (×5): qty 10

## 2024-02-01 MED ORDER — METRONIDAZOLE 500 MG/100ML IV SOLN
500.0000 mg | Freq: Two times a day (BID) | INTRAVENOUS | Status: DC
  Administered 2024-02-01 – 2024-02-02 (×2): 500 mg via INTRAVENOUS
  Filled 2024-02-01 (×2): qty 100

## 2024-02-01 MED ORDER — SODIUM CHLORIDE 0.9 % IV BOLUS
1000.0000 mL | Freq: Once | INTRAVENOUS | Status: AC
  Administered 2024-02-01: 1000 mL via INTRAVENOUS

## 2024-02-01 MED ORDER — SODIUM CHLORIDE 0.9 % IV SOLN: 2.0000 g | INTRAVENOUS | Status: DC

## 2024-02-01 MED ORDER — LACTATED RINGERS IV SOLN: INTRAVENOUS | Status: AC

## 2024-02-01 MED ORDER — APIXABAN 2.5 MG PO TABS
2.5000 mg | ORAL_TABLET | Freq: Two times a day (BID) | ORAL | Status: DC
  Administered 2024-02-01 – 2024-02-05 (×8): 2.5 mg via ORAL
  Filled 2024-02-01 (×8): qty 1

## 2024-02-01 MED ORDER — METRONIDAZOLE 500 MG/100ML IV SOLN
500.0000 mg | Freq: Once | INTRAVENOUS | Status: AC
  Administered 2024-02-01: 500 mg via INTRAVENOUS
  Filled 2024-02-01: qty 100

## 2024-02-01 MED ORDER — SODIUM CHLORIDE 0.9 % IV SOLN
2.0000 g | Freq: Once | INTRAVENOUS | Status: AC
  Administered 2024-02-01: 2 g via INTRAVENOUS
  Filled 2024-02-01: qty 12.5

## 2024-02-01 MED ORDER — VANCOMYCIN HCL 1250 MG/250ML IV SOLN: 1250.0000 mg | INTRAVENOUS | Status: DC

## 2024-02-01 MED ORDER — FENTANYL CITRATE PF 50 MCG/ML IJ SOSY: 12.5000 ug | PREFILLED_SYRINGE | INTRAMUSCULAR | Status: DC | PRN

## 2024-02-01 MED ORDER — CHLORHEXIDINE GLUCONATE CLOTH 2 % EX PADS
6.0000 | MEDICATED_PAD | Freq: Every day | CUTANEOUS | Status: DC
  Administered 2024-02-02 – 2024-02-05 (×4): 6 via TOPICAL

## 2024-02-01 MED ORDER — SODIUM CHLORIDE 0.9 % IV BOLUS (SEPSIS)
400.0000 mL | Freq: Once | INTRAVENOUS | Status: AC
  Administered 2024-02-01: 400 mL via INTRAVENOUS

## 2024-02-01 MED ORDER — SODIUM CHLORIDE 0.9 % IV BOLUS (SEPSIS)
1000.0000 mL | Freq: Once | INTRAVENOUS | Status: AC
  Administered 2024-02-01: 1000 mL via INTRAVENOUS

## 2024-02-01 MED ORDER — VANCOMYCIN HCL IN DEXTROSE 1-5 GM/200ML-% IV SOLN
1000.0000 mg | Freq: Once | INTRAVENOUS | Status: AC
  Administered 2024-02-01: 1000 mg via INTRAVENOUS
  Filled 2024-02-01: qty 200

## 2024-02-01 MED ORDER — LACTATED RINGERS IV BOLUS
1000.0000 mL | Freq: Once | INTRAVENOUS | Status: AC
  Administered 2024-02-01: 1000 mL via INTRAVENOUS

## 2024-02-01 MED ORDER — MIDODRINE HCL 5 MG PO TABS
10.0000 mg | ORAL_TABLET | Freq: Three times a day (TID) | ORAL | Status: DC
  Administered 2024-02-01 – 2024-02-04 (×8): 10 mg via ORAL
  Filled 2024-02-01 (×8): qty 2

## 2024-02-01 MED ORDER — ONDANSETRON HCL 4 MG PO TABS: 4.0000 mg | ORAL_TABLET | Freq: Four times a day (QID) | ORAL | Status: DC | PRN

## 2024-02-01 MED ORDER — ACETAMINOPHEN 325 MG PO TABS: 650.0000 mg | ORAL_TABLET | Freq: Four times a day (QID) | ORAL | Status: DC | PRN

## 2024-02-01 MED ORDER — ALPRAZOLAM 0.25 MG PO TABS
0.2500 mg | ORAL_TABLET | Freq: Three times a day (TID) | ORAL | Status: DC | PRN
  Administered 2024-02-02 – 2024-02-03 (×2): 0.25 mg via ORAL
  Filled 2024-02-01 (×2): qty 1

## 2024-02-01 MED ORDER — ACETAMINOPHEN 650 MG RE SUPP: 650.0000 mg | Freq: Four times a day (QID) | RECTAL | Status: DC | PRN

## 2024-02-01 NOTE — Progress Notes (Signed)
Patient arrived to unit with black cell phone (flip phone), Tablet with charging station and a Med-Alert necklace which is also on a charging station.

## 2024-02-01 NOTE — Hospital Course (Addendum)
 89 year old gentleman longtime resident of the [facility] with history of vascular dementia, ADH, coronary artery disease status post CABG, peripheral vascular disease with critical ischemia in the lower extremities no longer an operative candidate, hyperlipidemia, history of penile cancer status post partial penectomy with chronic indwelling Foley catheter, chronic dependent edema in the legs, GERD, hypertension, DNR, physical debility and frequent falls, paroxysmal A-fib not anticoagulated due to history of GI bleeding 2022, stage IIIb CKD, neurogenic bladder apparently according to reports from family he has been dealing with an ulceration involving the left leg for about 5 weeks.  It started as a blister and subsequently has gotten larger and larger and now has become infected.  The lesion thought to be secondary to poor circulation as a result of the critical limb ischemia that he is dealing with. Patient is No longer ambulatory and has to be fed by staff members.  He was noted to have a low-grade fever and upper respiratory symptoms over the past several days and was seen by the practitioner at the [facility] on 01/30/2023 and noted to have a WBC of 19.3.  He was noted to have a red hot right lower extremity concerning for cellulitis.  He was started on Zosyn om 01/31/24  He was subsequently sent to ED by EMS due to hypotension and tachycardia with concern for sepsis.  He arrived with low blood pressure and tachycardic heart rate with an elevated lactic acid of 4.1.  Code sepsis called and he was hydrated with IV fluids and started on broad-spectrum antibiotic therapy after blood cultures.  GOC Discussion with his son at time of admission by ED physician and admitting physician and he was agreeable to a trial of IV fluid and IV antibiotics but did not want to escalate care beyond that.  If he did not improve would want the focus of patient's care to be primarily on comfort and dignity.   The patient was  started on intravenous vancomycin  and cefepime  initially.  He was started on IV fluids.  His mental status gradually improved.  Although his blood pressure was initially low, this did gradually improve without vasopressor support.  The patient did develop atrial fibrillation with RVR during hospitalization.  He required amiodarone  drip.  Echocardiogram showed EF 60 to 65%, normal RVF, no WMA, moderate aortic stenosis.  As his infectious process was treated, his atrial fibrillation gradually improved.  He was started on metoprolol  for rate control of his atrial fibrillation.  He was weaned off of amiodarone , and his blood pressure and heart rate remained controlled.  He was discharged back to [facility] with 5 more days of amoxil .

## 2024-02-01 NOTE — Sepsis Progress Note (Signed)
 eLink is following this Code Sepsis.

## 2024-02-01 NOTE — Progress Notes (Signed)
 Pharmacy Antibiotic Note  Caleb [name] is a 89 y.o. male admitted on 02/01/2024 with sepsis.  Pharmacy has been consulted for vancomycin  and cefepime  dosing.  Plan: Vancomycin  2000 mg IV x 1 dose. Vancomycin  1250 mg IV every 48 hours. Cefepime  2000 mg IV every 24 hours. Monitor labs, c/s, and vanco levels as indicated.  Height: 5' 10 (177.8 cm) Weight: 113.9 kg (251 lb) IBW/kg (Calculated) : 73  Temp (24hrs), Avg:97.7 F (36.5 C), Min:97.7 F (36.5 C), Max:97.7 F (36.5 C)  Recent Labs  Lab 01/31/24 0530 02/01/24 1105 02/01/24 1311  WBC 19.0* 19.3*  --   CREATININE 1.96* 2.93*  --   LATICACIDVEN  --  4.1* 3.6*    Estimated Creatinine Clearance: 18.6 mL/min (A) (by C-G formula based on SCr of 2.93 mg/dL (H)).    No Known Allergies  Antimicrobials this admission: Vanco 2/4 >> Cefepime  2/4 >>  Flagyl  2/4 >>   Microbiology results: 2/4 BCx: pending 2/4 MRSA PCR: pending  Thank you for allowing pharmacy to be a part of this patient's care.  Elspeth Sour, PharmD Clinical Pharmacist 02/01/2024 1:56 PM

## 2024-02-01 NOTE — ED Provider Notes (Addendum)
 [place] EMERGENCY DEPARTMENT AT [facility] Provider Note   CSN: [id number] Arrival date & time: 02/01/24  1021     History  Chief Complaint  Patient presents with   Hypotension   Tachycardia    Caleb [name] is a 89 y.o. male.  89 year old male with a history of CAD status post CABG, PVD, obesity, and penile cancer with indwelling Foley catheter who presents emergency department due to concerns for sepsis.  History obtained per SNF staff and patient's son.  2 days ago patient started developing erythema both of his legs.  Does have a known wound on his left leg as well.  Yesterday had a temperature of 101.2.  The midline was inserted and the patient was started on antibiotics which appears to be Zosyn from their paperwork.  They also noticed that he had been congested recently as well.  Today started become tachycardic and hypotensive and 911 was called.       Home Medications Prior to Admission medications   Medication Sig Start Date End Date Taking? Authorizing Provider  acetaminophen  (TYLENOL ) 325 MG tablet Take 650 mg by mouth every 6 (six) hours as needed for fever or moderate pain (pain score 4-6).   Yes [provider]  ELIQUIS  2.5 MG TABS tablet Take 2.5 mg by mouth 2 (two) times daily. 12/21/23  Yes [provider]  miconazole (MICOTIN) 2 % powder Apply topically in the morning and at bedtime.   Yes [provider]  Multiple Vitamins-Minerals (PRESERVISION AREDS 2) CAPS Take 1 tablet by mouth daily.   Yes [provider]  piperacillin-tazobactam 2.25 g in sodium chloride  0.9 % 50 mL Inject 2.25 g into the vein every 6 (six) hours. Right leg cellulitis 01/31/24 02/07/24 Yes [provider]  sennosides-docusate sodium  (SENOKOT-S) 8.6-50 MG tablet Take 1 tablet by mouth daily.   Yes [provider]  sodium chloride  1 g tablet Take 1 tablet (1 g total) by mouth 2 (two) times daily with a meal. 09/01/23  Yes Bryn Bernardino NOVAK, MD  spironolactone  (ALDACTONE ) 25 MG tablet Take 0.5 tablets (12.5 mg total) by mouth daily. 07/09/23  Yes Claudene Pacific, MD      Allergies    Patient has no known allergies.    Review of Systems   Review of Systems  Physical Exam Updated Vital Signs BP (!) 95/57   Pulse 66   Temp (!) 97.4 F (36.3 C) (Oral)   Resp 17   Ht 5' 10 (1.778 m)   Wt 115.4 kg   SpO2 100%   BMI 36.50 kg/m  Physical Exam Vitals and nursing note reviewed.  Constitutional:      General: He is not in acute distress.    Appearance: He is well-developed.  HENT:     Head: Normocephalic and atraumatic.     Right Ear: External ear normal.     Left Ear: External ear normal.     Nose: Nose normal.  Eyes:     Extraocular Movements: Extraocular movements intact.     Conjunctiva/sclera: Conjunctivae normal.     Pupils: Pupils are equal, round, and reactive to light.  Cardiovascular:     Rate and Rhythm: Tachycardia present. Rhythm irregular.     Heart sounds: Normal heart sounds.  Pulmonary:     Effort: Pulmonary effort is normal. No respiratory distress.     Breath sounds: Normal breath sounds.  Abdominal:     General: There is no distension.  Palpations: Abdomen is soft. There is no mass.     Tenderness: There is no abdominal tenderness. There is no guarding.  Musculoskeletal:     Cervical back: Normal range of motion and neck supple.     Right lower leg: Edema present.     Left lower leg: Edema present.     Comments: No signs of Fournier's gangrene noted.  Indwelling Foley catheter.  No sacral wounds.  Open wound of left lower extremity.  See image below.  Skin:    General: Skin is warm and dry.  Neurological:     Mental Status: He is alert. Mental status is at baseline.  Psychiatric:        Mood and Affect: Mood normal.        Behavior: Behavior normal.    Left leg:   ED Results / Procedures / Treatments   Labs (all labs ordered are listed, but only abnormal results are  displayed) Labs Reviewed  LACTIC ACID, PLASMA - Abnormal; Notable for the following components:      Result Value   Lactic Acid, Venous 4.1 (*)    All other components within normal limits  LACTIC ACID, PLASMA - Abnormal; Notable for the following components:   Lactic Acid, Venous 3.6 (*)    All other components within normal limits  COMPREHENSIVE METABOLIC PANEL - Abnormal; Notable for the following components:   CO2 20 (*)    Glucose, Bld 183 (*)    BUN 56 (*)    Creatinine, Ser 2.93 (*)    Calcium  8.7 (*)    Albumin  3.0 (*)    AST 127 (*)    ALT 97 (*)    GFR, Estimated 19 (*)    All other components within normal limits  CBC WITH DIFFERENTIAL/PLATELET - Abnormal; Notable for the following components:   WBC 19.3 (*)    RBC 3.22 (*)    Hemoglobin 10.6 (*)    HCT 32.7 (*)    MCV 101.6 (*)    RDW 17.0 (*)    Platelets 128 (*)    Neutro Abs 17.8 (*)    Lymphs Abs 0.2 (*)    Abs Immature Granulocytes 0.60 (*)    All other components within normal limits  URINALYSIS, W/ REFLEX TO CULTURE (INFECTION SUSPECTED) - Abnormal; Notable for the following components:   APPearance CLOUDY (*)    Hgb urine dipstick SMALL (*)    Protein, ur 30 (*)    Leukocytes,Ua MODERATE (*)    All other components within normal limits  LACTIC ACID, PLASMA - Abnormal; Notable for the following components:   Lactic Acid, Venous 3.3 (*)    All other components within normal limits  RESP PANEL BY RT-PCR (RSV, FLU A&B, COVID)  RVPGX2  MRSA NEXT GEN BY PCR, NASAL  CULTURE, BLOOD (ROUTINE X 2)  CULTURE, BLOOD (ROUTINE X 2)  CATH TIP CULTURE  URINE CULTURE  COMPREHENSIVE METABOLIC PANEL  MAGNESIUM   CBC WITH DIFFERENTIAL/PLATELET  LACTIC ACID, PLASMA    EKG None  Radiology DG Chest Port 1 View Result Date: 02/01/2024 CLINICAL DATA:  Questionable sepsis - evaluate for abnormality. Hypotension and tachycardia. EXAM: PORTABLE CHEST 1 VIEW COMPARISON:  08/29/2023. FINDINGS: Low lung volume. There are  nonspecific opacities throughout bilateral lungs, grossly similar to the prior study favored to represent underlying fibrosis/scarring. No frank pulmonary edema. No acute dense consolidation or lung collapse. Bilateral costophrenic angles are clear. Note is made of elevated left hemidiaphragm. Stable cardio-mediastinal silhouette. No acute  osseous abnormalities. The soft tissues are within normal limits. IMPRESSION: No active disease. Electronically Signed   By: Ree Molt M.D.   On: 02/01/2024 11:54    Procedures Procedures    Medications Ordered in ED Medications  acetaminophen  (TYLENOL ) tablet 650 mg (has no administration in time range)    Or  acetaminophen  (TYLENOL ) suppository 650 mg (has no administration in time range)  lactated ringers  infusion ( Intravenous Infusion Verify 02/01/24 1809)  fentaNYL  (SUBLIMAZE ) injection 12.5-25 mcg (has no administration in time range)  ondansetron  (ZOFRAN ) tablet 4 mg (has no administration in time range)    Or  ondansetron  (ZOFRAN ) injection 4 mg (has no administration in time range)  pantoprazole  (PROTONIX ) injection 40 mg (40 mg Intravenous Given 02/01/24 1420)  metroNIDAZOLE  (FLAGYL ) IVPB 500 mg (has no administration in time range)  Chlorhexidine  Gluconate Cloth 2 % PADS 6 each (has no administration in time range)  vancomycin  (VANCOREADY) IVPB 1250 mg/250 mL (has no administration in time range)  ceFEPIme  (MAXIPIME ) 2 g in sodium chloride  0.9 % 100 mL IVPB (has no administration in time range)  ALPRAZolam  (XANAX ) tablet 0.25 mg (has no administration in time range)  apixaban  (ELIQUIS ) tablet 2.5 mg (has no administration in time range)  sodium chloride  0.9 % bolus 1,000 mL (has no administration in time range)  midodrine  (PROAMATINE ) tablet 10 mg (has no administration in time range)  sodium chloride  0.9 % bolus 1,000 mL (1,000 mLs Intravenous New Bag/Given 02/01/24 1118)    And  sodium chloride  0.9 % bolus 1,000 mL (1,000 mLs Intravenous  New Bag/Given 02/01/24 1306)    And  sodium chloride  0.9 % bolus 400 mL (400 mLs Intravenous New Bag/Given 02/01/24 1307)  ceFEPIme  (MAXIPIME ) 2 g in sodium chloride  0.9 % 100 mL IVPB (0 g Intravenous Stopped 02/01/24 1157)  metroNIDAZOLE  (FLAGYL ) IVPB 500 mg (0 mg Intravenous Stopped 02/01/24 1225)  vancomycin  (VANCOCIN ) IVPB 1000 mg/200 mL premix (0 mg Intravenous Stopped 02/01/24 1222)  vancomycin  (VANCOCIN ) IVPB 1000 mg/200 mL premix (0 mg Intravenous Stopped 02/01/24 1506)  lactated ringers  bolus 1,000 mL ( Intravenous Stopped 02/01/24 1621)    ED Course/ Medical Decision Making/ A&P Clinical Course as of 02/01/24 2030  Tue Feb 01, 2024  1126 DNR/DNI confirmed with the patient's son [RP]  1200 Creatinine(!): 2.93 Baseline of 1.5 [RP]  1253 Dr Vicci from hospitalist to admit the patient [RP]    Clinical Course User Index [RP] Yolande Lamar BROCKS, MD                                 Medical Decision Making Amount and/or Complexity of Data Reviewed Labs: ordered. Decision-making details documented in ED Course. Radiology: ordered.  Risk Prescription drug management. Decision regarding hospitalization.   JAVARI BUFKIN is a 89 y.o. male with comorbidities that complicate the patient evaluation including CAD status post CABG, PVD, obesity, and penile cancer with indwelling Foley catheter who presents emergency department due to concerns for sepsis.    Initial Ddx:  Sepsis, cellulitis, necrotizing fasciitis, UTI, pneumonia, URI  MDM/Course:  Patient presents emergency department due to concerns for sepsis.  On arrival has soft blood pressures and is tachycardic.  Appears to be in atrial fibrillation with RVR but I suspect that this is due to a compensatory response rather than primary A-fib with RVR.  He was however afebrile.  On exam does have a wound of his left lower extremity  with some erythema.  This could be reflective of cellulitis.  He also has an indwelling Foley catheter which  could dispose into a UTI.  He was started on broad-spectrum antibiotics on arrival to the emergency department.  He had labs that showed leukocytosis of 19 with a lactic acidosis of 4.1.  He was given 30 mL/kg of IV fluids.  Heart rate normalized.  He had a chest x-ray that did not show evidence of pneumonia.  He had a urinalysis that was sent and pending at the time of admission.  Also was found to have an AKI on lab work.  Upon re-evaluation patient was stable.  This patient presents to the ED for concern of complaints listed in HPI, this involves an extensive number of treatment options, and is a complaint that carries with it a high risk of complications and morbidity. Disposition including potential need for admission considered.   Dispo: Admit to Floor  Additional history obtained from son Records reviewed Outpatient Clinic Notes The following labs were independently interpreted: Chemistry and show AKI I independently reviewed the following imaging with scope of interpretation limited to determining acute life threatening conditions related to emergency care: Chest x-ray and agree with the radiologist interpretation with the following exceptions: none I personally reviewed and interpreted cardiac monitoring: atrial fibrillation with RVR I personally reviewed and interpreted the pt's EKG: see above for interpretation  I have reviewed the patients home medications and made adjustments as needed Consults: Hospitalist Social Determinants of health:  Elderly  Portions of this note were generated with Scientist, clinical (histocompatibility and immunogenetics). Dictation errors may occur despite best attempts at proofreading.    CRITICAL CARE Performed by: Lamar JAYSON Shan   Total critical care time: 30 minutes  Critical care time was exclusive of separately billable procedures and treating other patients.  Critical care was necessary to treat or prevent imminent or life-threatening deterioration.  Critical care was  time spent personally by me on the following activities: development of treatment plan with patient and/or surrogate as well as nursing, discussions with consultants, evaluation of patient's response to treatment, examination of patient, obtaining history from patient or surrogate, ordering and performing treatments and interventions, ordering and review of laboratory studies, ordering and review of radiographic studies, pulse oximetry and re-evaluation of patient's condition.   Final Clinical Impression(s) / ED Diagnoses Final diagnoses:  Atrial fibrillation with RVR (HCC)  AKI (acute kidney injury) (HCC)  Septic shock (HCC)  Cellulitis of left lower extremity    Rx / DC Orders ED Discharge Orders     None         Shan Lamar JAYSON, MD 02/01/24 2031

## 2024-02-01 NOTE — Progress Notes (Signed)
 In nurse to nurse report from ED RN, she stated patient had a PICC line in left upper arm. When patient arrived to unit from ED, Dressing on picc line was half off. Dr Vicci made aware and order to remove line placed. Line removed and looked that tip was intact. Pressure applied along with pressure dressing applied to site. Patient tolerated well. No PICC line noted on LDA or from what I could see in the chart. Dr Vicci aware. Foley catheter patent. Patient alert and oriented x4.

## 2024-02-01 NOTE — Plan of Care (Signed)

## 2024-02-01 NOTE — ED Triage Notes (Signed)
[facility] called EMS due to hypotension and tachycardia with concerns for sepsis.

## 2024-02-01 NOTE — H&P (Signed)
 History and Physical  [facility]  SUSANA GRIPP FMW:[id number] DOB: 29-Nov-1925 DOA: 02/01/2024  PCP: Tish Elsie FALCON, MD  Patient coming from: [facility] (LTC) Level of care: Stepdown  I have personally briefly reviewed patient's old medical records in [facility]  Chief Complaint: hypotension  HPI: AKSHAY SPANG is a 89 year old gentleman longtime resident of the [facility], history of coronary artery disease status post CABG, peripheral vascular disease with critical ischemia in the lower extremities no longer an operative candidate, hyperlipidemia, history of penile cancer status post partial penectomy with chronic indwelling Foley catheter, chronic dependent edema in the legs, GERD, hypertension, DNR, physical debility and frequent falls, history of paroxysmal A-fib not anticoagulated due to history of GI bleeding 2022, stage IIIb CKD, neurogenic bladder apparently according to reports from family he has been dealing with an ulceration involving the left leg for about 5 weeks.  It started as a blister and subsequently has gotten larger and larger and now has become infected.  The lesion thought to be secondary to poor circulation as a result of the critical limb ischemia that he is dealing with.  He chronically complains of cold feet.  No longer ambulatory and has to be fed by staff members.  He was noted to have a low-grade fever and upper respiratory symptoms over the past several days and was seen by the practitioner at the [facility] on 01/30/2023 and noted to have a WBC of 19.3.  He was noted to have a red hot right lower extremity concerning for cellulitis.  He was started on Zosyn.  He was subsequently sent to ED by EMS due to hypotension and tachycardia with concern for sepsis.  He arrived with low blood pressure and tachycardic heart rate with an elevated lactic acid of 4.1.  Code sepsis called and he was hydrated with IV fluids and started on broad-spectrum  antibiotic therapy after blood cultures.  Discussion with his son today by ED physician and admitting physician and he was agreeable to a trial of IV fluid and IV antibiotics but did not want to escalate care beyond that.  If he did not improve would want the focus of patient's care to be primarily on comfort and dignity.  He requested pain and anxiety medications now.  He was agreeable to revisit patient's progress in 24 hours to decide if transition to full comfort measures desired.    Past Medical History:  Diagnosis Date   Coronary artery disease    Hyperlipidemia    Obesity    Penile cancer (HCC)    Swelling of extremity    Left Leg    Past Surgical History:  Procedure Laterality Date   APPENDECTOMY     BACK SURGERY     BIOPSY  07/22/2021   Procedure: BIOPSY;  Surgeon: Charlanne Groom, MD;  Location: [facility] ENDOSCOPY;  Service: Gastroenterology;;   CHOLECYSTECTOMY     CORONARY ANGIOPLASTY  11/2005   RCA PCI AND STENTING WITH A CYPHER DES   CORONARY ARTERY BYPASS GRAFT     ESOPHAGOGASTRODUODENOSCOPY (EGD) WITH PROPOFOL  N/A 07/22/2021   Procedure: ESOPHAGOGASTRODUODENOSCOPY (EGD) WITH PROPOFOL ;  Surgeon: Charlanne Groom, MD;  Location: [facility] ENDOSCOPY;  Service: Gastroenterology;  Laterality: N/A;   HIATAL HERNIA REPAIR     lymph node removal     NM MYOCAR MULTIPLE W/SPECT  11/26/2009   EF 62%. NORMAL MYOCARDIAL PERFUSION STUDY.   TRANSTHORACIC ECHOCARDIOGRAM  12/22/2005   MILD TO MOD AORTIC SCLEROSIS W/O STENOSIS.  MILD TO MODERATE MITRAL CALCIFICATION. LA- MILDLY DILATED.     reports that he quit smoking about 40 years ago. His smoking use included cigarettes. He has never used smokeless tobacco. He reports that he does not drink alcohol and does not use drugs.  No Known Allergies  Family History  Problem Relation Age of Onset   Cancer Father    Cancer Sister    Cancer Brother    Cancer Sister    Cancer Other     Prior to Admission medications   Medication Sig Start Date End  Date Taking? Authorizing Provider  acetaminophen  (TYLENOL ) 325 MG tablet Take 650 mg by mouth every 6 (six) hours as needed for fever or moderate pain (pain score 4-6).   Yes [provider]  ELIQUIS  2.5 MG TABS tablet Take 2.5 mg by mouth 2 (two) times daily. 12/21/23  Yes [provider]  miconazole (MICOTIN) 2 % powder Apply topically in the morning and at bedtime.   Yes [provider]  Multiple Vitamins-Minerals (PRESERVISION AREDS 2) CAPS Take 1 tablet by mouth daily.   Yes [provider]  piperacillin-tazobactam 2.25 g in sodium chloride  0.9 % 50 mL Inject 2.25 g into the vein every 6 (six) hours. Right leg cellulitis 01/31/24 02/07/24 Yes [provider]  sennosides-docusate sodium  (SENOKOT-S) 8.6-50 MG tablet Take 1 tablet by mouth daily.   Yes [provider]  sodium chloride  1 g tablet Take 1 tablet (1 g total) by mouth 2 (two) times daily with a meal. 09/01/23  Yes Bryn Bernardino NOVAK, MD  spironolactone  (ALDACTONE ) 25 MG tablet Take 0.5 tablets (12.5 mg total) by mouth daily. 07/09/23  Yes Claudene Pacific, MD    Physical Exam: Vitals:   02/01/24 1245 02/01/24 1300 02/01/24 1404 02/01/24 1419  BP: 101/77 99/74  96/73  Pulse:    (!) 51  Resp: 16 17  20   Temp:   97.8 F (36.6 C)   TempSrc:   Oral   SpO2:    100%  Weight:    115.4 kg  Height:    5' 10 (1.778 m)    Constitutional: awake, alert, appears chronically ill; uncomfortable Eyes: PERRL, lids and conjunctivae normal ENMT: Mucous membranes are moist. Posterior pharynx clear of any exudate or lesions. Normal dentition for age.  Neck: normal, supple, no masses, no thyromegaly Respiratory: clear to auscultation bilaterally, no wheezing, no crackles. Normal respiratory effort. No accessory muscle use.  Cardiovascular: normal s1, s2 sounds, no murmurs / rubs / gallops. No extremity edema. Minimal palpable pedal pulses. No carotid bruits.  Abdomen: no tenderness, no masses palpated. No  hepatosplenomegaly. Bowel sounds positive.  Musculoskeletal: no clubbing / cyanosis. No joint deformity upper and lower extremities. Good ROM, no contractures. Normal muscle tone. Cold feet;  GU: foley in place (changed out in the ED 02/01/24) Skin: erythema right leg, vascular ulceration left leg appears infected round with yellow purulent drainage with foul smell; about 5 cm diameter     Neurologic: CN 2-12 grossly intact. Sensation intact, DTR normal. Strength 5/5 in all 4.  Psychiatric: Normal judgment and insight. Alert and oriented x 3. Normal mood.   Labs on Admission: I have personally reviewed following labs and imaging studies  CBC: Recent Labs  Lab 01/31/24 0530 02/01/24 1105  WBC 19.0* 19.3*  NEUTROABS 18.4* 17.8*  HGB 11.3* 10.6*  HCT 35.8* 32.7*  MCV 100.6* 101.6*  PLT 152 128*   Basic Metabolic Panel: Recent Labs  Lab 01/31/24 0530  02/01/24 1105  NA 136 136  K 4.9 4.6  CL 102 103  CO2 20* 20*  GLUCOSE 129* 183*  BUN 38* 56*  CREATININE 1.96* 2.93*  CALCIUM  9.0 8.7*   GFR: Estimated Creatinine Clearance: 18.8 mL/min (A) (by C-G formula based on SCr of 2.93 mg/dL (H)). Liver Function Tests: Recent Labs  Lab 02/01/24 1105  AST 127*  ALT 97*  ALKPHOS 50  BILITOT 0.7  PROT 6.6  ALBUMIN  3.0*   No results for input(s): LIPASE, AMYLASE in the last 168 hours. No results for input(s): AMMONIA in the last 168 hours. Coagulation Profile: No results for input(s): INR, PROTIME in the last 168 hours. Cardiac Enzymes: No results for input(s): CKTOTAL, CKMB, CKMBINDEX, TROPONINI in the last 168 hours. BNP (last 3 results) No results for input(s): PROBNP in the last 8760 hours. HbA1C: No results for input(s): HGBA1C in the last 72 hours. CBG: No results for input(s): GLUCAP in the last 168 hours. Lipid Profile: No results for input(s): CHOL, HDL, LDLCALC, TRIG, CHOLHDL, LDLDIRECT in the last 72 hours. Thyroid  Function  Tests: No results for input(s): TSH, T4TOTAL, FREET4, T3FREE, THYROIDAB in the last 72 hours. Anemia Panel: No results for input(s): VITAMINB12, FOLATE, FERRITIN, TIBC, IRON, RETICCTPCT in the last 72 hours. Urine analysis:    Component Value Date/Time   COLORURINE STRAW (A) 08/29/2023 1225   APPEARANCEUR CLEAR 08/29/2023 1225   APPEARANCEUR Cloudy (A) 08/18/2023 1458   LABSPEC 1.004 (L) 08/29/2023 1225   PHURINE 6.0 08/29/2023 1225   GLUCOSEU NEGATIVE 08/29/2023 1225   HGBUR SMALL (A) 08/29/2023 1225   BILIRUBINUR NEGATIVE 08/29/2023 1225   BILIRUBINUR Negative 08/18/2023 1458   KETONESUR NEGATIVE 08/29/2023 1225   PROTEINUR NEGATIVE 08/29/2023 1225   UROBILINOGEN 0.2 03/25/2020 1156   NITRITE NEGATIVE 08/29/2023 1225   LEUKOCYTESUR SMALL (A) 08/29/2023 1225    Radiological Exams on Admission: DG Chest Port 1 View Result Date: 02/01/2024 CLINICAL DATA:  Questionable sepsis - evaluate for abnormality. Hypotension and tachycardia. EXAM: PORTABLE CHEST 1 VIEW COMPARISON:  08/29/2023. FINDINGS: Low lung volume. There are nonspecific opacities throughout bilateral lungs, grossly similar to the prior study favored to represent underlying fibrosis/scarring. No frank pulmonary edema. No acute dense consolidation or lung collapse. Bilateral costophrenic angles are clear. Note is made of elevated left hemidiaphragm. Stable cardio-mediastinal silhouette. No acute osseous abnormalities. The soft tissues are within normal limits. IMPRESSION: No active disease. Electronically Signed   By: Ree Molt M.D.   On: 02/01/2024 11:54    EKG: Independently reviewed.   Assessment/Plan Principal Problem:   Severe sepsis (HCC) Active Problems:   Leg wound, left   Acute renal failure superimposed on stage 3b chronic kidney disease (HCC)   Group B Strep Bacteremia   Hyperlipidemia   Coronary artery disease   Peripheral vascular disease, unspecified (HCC)   Stasis edema with  ulcer (HCC)   Discoloration of skin-Left Leg   Cellulitis   Urinary retention   History of penile cancer   Atrial fibrillation (HCC)   Leukocytosis   Transient hypotension   Antiplatelet or antithrombotic long-term use   Neurocognitive deficits   History of penectomy   Chronic indwelling Foley catheter   Status post urethrostomy (HCC)   SIADH (syndrome of inappropriate ADH production) (HCC)   Vascular dementia without behavioral disturbance (HCC)   DNR (do not resuscitate)   Lactic acidosis   Elevated liver enzymes   Hyperglycemia   Positive blood culture   Acquired thrombophilia (HCC)   FTT (  failure to thrive) in adult   Severe sepsis -suspect from infected left leg vascular ulceration with surrounding cellulitis -after goals of care discussion with son, we are treating sepsis aggressively with IV fluids and IV antibiotics but he gave clear instructions to not escalate care.  Revisit progress in 24 hours and if no improvement would plan to transition focus of care to full comfort and dignity.  -continue broad spectrum antibiotics -trending lactic acid and try to get it below 2 if patient can tolerate the fluid -follow blood cultures -still waiting on urine to be sent  -further recommendations to follow pending clinical course   DNR present on admission  -continue DNR order in hospital   Positive blood culture  Group B strep Bacteremia -suspect from open left leg wound  -strep species - continue current antibiotics  Vascular ulceration of left leg -secondary to known critical ischemia in the legs -wound care RN consult requested  -he is not an operative candidate for invasive procedures -treating supportively  Paroxysmal atrial fibrillation Acquired thrombophilia -continue apixaban  2.5 mg BID   Chronic indwelling foley cath Neurogenic bladder -pt not sure when foley was last exchanged -this was done in ED and I confirmed with ED RN that this was done on  02/01/24  Vascular Dementia Failure to thrive (adult) -discussion with son -he wants to treat pain and anxiety  -if no improvement from medical treatments in next day transition to full comfort measures  AKI on stage 3b CKD -treating with IV fluid hydration -foley catheter exchanged 2/4 -recheck labs in AM   Critical Care Procedure Note Authorized and Performed by: KYM Louder MD  Total Critical Care time:  58 mins Due to a high probability of clinically significant, life threatening deterioration, the patient required my highest level of preparedness to intervene emergently and I personally spent this critical care time directly and personally managing the patient.  This critical care time included obtaining a history; examining the patient, pulse oximetry; ordering and review of studies; arranging urgent treatment with development of a management plan; evaluation of patient's response of treatment; frequent reassessment; and discussions with other providers.  This critical care time was performed to assess and manage the high probability of imminent and life threatening deterioration that could result in multi-organ failure.  It was exclusive of separately billable procedures and treating other patients and teaching time.   DVT prophylaxis: apixaban    Code Status: DNR   Family Communication: son updated 02/01/24 Cherylin)  Disposition Plan: TBD   Consults called:   Admission status: INP  Level of care: Stepdown Afton Louder MD [facility] How to contact the [facility] Attending or Consulting provider 7A - 7P or covering provider during after hours 7P -7A, for this patient?  Check the care team in [facility] and look for a) attending/consulting [facility] provider listed and b) the [facility] team listed Log into [email or web address] and use [place]'s universal password to access. If you do not have the password, please contact the hospital operator. Locate the [facility] provider you are looking for under [facility] and page to a number that you can be directly reached. If you still have difficulty reaching the provider, please page the [facility] (Director on Call) for the Hospitalists listed on amion for assistance.   If 7PM-7AM, please contact night-coverage [email or web address] Password [facility]  02/01/2024, 3:55 PM

## 2024-02-01 NOTE — ED Notes (Signed)
 Date and time results received: 02/01/24 1139 (use smartphrase .now to insert current time)  Test: lactic acid Critical Value: 4.1  Name of Provider Notified: Dr Yolande  Orders Received? Or Actions Taken?: Orders Received - See Orders for details

## 2024-02-02 DIAGNOSIS — A401 Sepsis due to streptococcus, group B: Secondary | ICD-10-CM | POA: Diagnosis not present

## 2024-02-02 DIAGNOSIS — N179 Acute kidney failure, unspecified: Secondary | ICD-10-CM | POA: Diagnosis not present

## 2024-02-02 DIAGNOSIS — N1832 Chronic kidney disease, stage 3b: Secondary | ICD-10-CM

## 2024-02-02 DIAGNOSIS — D6869 Other thrombophilia: Secondary | ICD-10-CM | POA: Diagnosis not present

## 2024-02-02 DIAGNOSIS — I4891 Unspecified atrial fibrillation: Secondary | ICD-10-CM

## 2024-02-02 DIAGNOSIS — R6520 Severe sepsis without septic shock: Secondary | ICD-10-CM | POA: Diagnosis not present

## 2024-02-02 HISTORY — DX: Sepsis due to Streptococcus, group B: A40.1

## 2024-02-02 LAB — CBC WITH DIFFERENTIAL/PLATELET
Abs Immature Granulocytes: 0.7 10*3/uL — ABNORMAL HIGH (ref 0.00–0.07)
Band Neutrophils: 11 %
Basophils Absolute: 0 10*3/uL (ref 0.0–0.1)
Basophils Relative: 0 %
Eosinophils Absolute: 0 10*3/uL (ref 0.0–0.5)
Eosinophils Relative: 0 %
HCT: 28.4 % — ABNORMAL LOW (ref 39.0–52.0)
Hemoglobin: 8.9 g/dL — ABNORMAL LOW (ref 13.0–17.0)
Lymphocytes Relative: 2 %
Lymphs Abs: 0.3 10*3/uL — ABNORMAL LOW (ref 0.7–4.0)
MCH: 31.2 pg (ref 26.0–34.0)
MCHC: 31.3 g/dL (ref 30.0–36.0)
MCV: 99.6 fL (ref 80.0–100.0)
Metamyelocytes Relative: 4 %
Monocytes Absolute: 0.7 10*3/uL (ref 0.1–1.0)
Monocytes Relative: 5 %
Myelocytes: 1 %
Neutro Abs: 12.2 10*3/uL — ABNORMAL HIGH (ref 1.7–7.7)
Neutrophils Relative %: 77 %
Platelets: 117 10*3/uL — ABNORMAL LOW (ref 150–400)
RBC: 2.85 MIL/uL — ABNORMAL LOW (ref 4.22–5.81)
RDW: 16.8 % — ABNORMAL HIGH (ref 11.5–15.5)
WBC: 13.9 10*3/uL — ABNORMAL HIGH (ref 4.0–10.5)
nRBC: 0 % (ref 0.0–0.2)

## 2024-02-02 LAB — COMPREHENSIVE METABOLIC PANEL
ALT: 80 U/L — ABNORMAL HIGH (ref 0–44)
AST: 89 U/L — ABNORMAL HIGH (ref 15–41)
Albumin: 2.5 g/dL — ABNORMAL LOW (ref 3.5–5.0)
Alkaline Phosphatase: 44 U/L (ref 38–126)
Anion gap: 6 (ref 5–15)
BUN: 49 mg/dL — ABNORMAL HIGH (ref 8–23)
CO2: 20 mmol/L — ABNORMAL LOW (ref 22–32)
Calcium: 7.6 mg/dL — ABNORMAL LOW (ref 8.9–10.3)
Chloride: 109 mmol/L (ref 98–111)
Creatinine, Ser: 2 mg/dL — ABNORMAL HIGH (ref 0.61–1.24)
GFR, Estimated: 30 mL/min — ABNORMAL LOW (ref >60)
Glucose, Bld: 103 mg/dL — ABNORMAL HIGH (ref 70–99)
Potassium: 4.3 mmol/L (ref 3.5–5.1)
Sodium: 135 mmol/L (ref 135–145)
Total Bilirubin: 0.7 mg/dL (ref 0.0–1.2)
Total Protein: 5.4 g/dL — ABNORMAL LOW (ref 6.5–8.1)

## 2024-02-02 LAB — URINE CULTURE: Culture: <10000 — AB

## 2024-02-02 LAB — MAGNESIUM: Magnesium: 2.2 mg/dL (ref 1.7–2.4)

## 2024-02-02 LAB — LACTIC ACID, PLASMA: Lactic Acid, Venous: 1.7 mmol/L (ref 0.5–1.9)

## 2024-02-02 MED ORDER — NOREPINEPHRINE 4 MG/250ML-% IV SOLN: 0.0000 ug/min | INTRAVENOUS | Status: DC

## 2024-02-02 MED ORDER — AMIODARONE LOAD VIA INFUSION
150.0000 mg | Freq: Once | INTRAVENOUS | Status: AC
  Administered 2024-02-02: 150 mg via INTRAVENOUS
  Filled 2024-02-02: qty 83.34

## 2024-02-02 MED ORDER — AMIODARONE HCL IN DEXTROSE 360-4.14 MG/200ML-% IV SOLN
30.0000 mg/h | INTRAVENOUS | Status: DC
Start: 2024-02-02 — End: 2024-02-05
  Administered 2024-02-02 – 2024-02-05 (×6): 30 mg/h via INTRAVENOUS
  Filled 2024-02-02 (×6): qty 200

## 2024-02-02 MED ORDER — SODIUM CHLORIDE 0.9 % IV SOLN: 250.0000 mL | INTRAVENOUS | Status: AC

## 2024-02-02 MED ORDER — NOREPINEPHRINE 4 MG/250ML-% IV SOLN: 2.0000 ug/min | INTRAVENOUS | Status: DC

## 2024-02-02 MED ORDER — AMIODARONE HCL IN DEXTROSE 360-4.14 MG/200ML-% IV SOLN
60.0000 mg/h | INTRAVENOUS | Status: AC
  Administered 2024-02-02 (×2): 60 mg/h via INTRAVENOUS
  Filled 2024-02-02: qty 200

## 2024-02-02 MED ORDER — SODIUM CHLORIDE 0.9 % IV BOLUS
1000.0000 mL | Freq: Once | INTRAVENOUS | Status: AC
  Administered 2024-02-02: 1000 mL via INTRAVENOUS

## 2024-02-02 MED ORDER — CEFTRIAXONE SODIUM 2 G IJ SOLR
2.0000 g | INTRAMUSCULAR | Status: AC
  Administered 2024-02-02 – 2024-02-05 (×4): 2 g via INTRAVENOUS
  Filled 2024-02-02 (×4): qty 20

## 2024-02-02 MED ORDER — HYDROCERIN EX CREA
TOPICAL_CREAM | Freq: Two times a day (BID) | CUTANEOUS | Status: DC
  Administered 2024-02-04: 1 via TOPICAL
  Filled 2024-02-02: qty 113

## 2024-02-02 NOTE — Progress Notes (Signed)
 Critical care note:  Date of note: 2//2025  Subjective: Patient has been having persistent hypotension despite earlier IV hydration and 2 L bolus of IV normal saline as well as midodrine .  No nausea or vomiting or diarrhea.  No fever or chills.  No chest pain or palpitations.  No cough or wheezing or dyspnea.  Objective: Physical examination: Generally: Acutely ill elderly Caucasian male in no acute respiratory distress. Vital signs per history of present illness. Head - atraumatic, normocephalic.  Pupils - equal, round and reactive to light and accommodation. Extraocular movements are intact. No scleral icterus.  Oropharynx - moist mucous membranes and tongue. No pharyngeal erythema or exudate.  Neck - supple. No JVD. Carotid pulses 2+ bilaterally. No carotid bruits. No palpable thyromegaly or lymphadenopathy. Cardiovascular - regular rate and rhythm. Normal S1 and S2. No murmurs, gallops or rubs.  Lungs - clear to auscultation bilaterally.  Abdomen - soft and nontender. Positive bowel sounds. No palpable organomegaly or masses.  Extremities - no pitting edema, clubbing or cyanosis.  Neuro - grossly non-focal. GU and rectal exam - deferred. Skin: erythema right leg, vascular ulceration left leg appears infected round with yellow purulent drainage with foul smell; about 5 cm diameter     Labs and notes were reviewed.  Assessment/plan: 1.  Septic shock like secondary to right leg cellulitis and infected wound. - The patient was given aggressive hydration with IV normal saline and IV antibiotic therapy with vancomycin  and cefepime .  He remained hypotensive despite adequate management. - He was ordered IV Levophed  that was initially held off but later on reinstated given persistent hypotension. -Will continue to closely monitor him. - We will continue other current plan of care.  Authorized and performed by: Madison Peaches, MD Total critical care time:   30     minutes. Due to a high  probability of clinically significant, life-threatening deterioration, the patient required my highest level of preparedness to intervene emergently and I personally spent this critical care time directly and personally managing the patient.  This critical care time included obtaining a history, examining the patient, pulse oximetry, ordering and review of studies, arranging urgent treatment with development of management plan, evaluation of patient's response to treatment, frequent reassessment, and discussions with other providers. This critical care time was performed to assess and manage the high probability of imminent, life-threatening deterioration that could result in multiorgan failure.  It was exclusive of separately billable procedures and treating other patients and teaching time.

## 2024-02-02 NOTE — Consult Note (Signed)
 WOC Nurse Consult Note: patient states he does not know how long he has had the left leg wound, per H&P family states has been there for 5 weeks and started as a blister Reason for Consult: L leg wound  Wound type:  1. Full thickness wound L medial lower leg ? R/t venous insufficiency  2.  Unstageable PI L heel  Pressure Injury POA: Yes Measurement: 1.  L medial lower leg wound 4 cm x 3.4 cm x 0.2 cm 50% red moist 50% yellow  2.  L heel Unstageable PI 1 cm x 1 cm area of 100% brown eschar Wound bed: as above  Drainage (amount, consistency, odor) minimal tan exudate L lower leg, L heel dry  Periwound: both legs edematous( R leg greater than L) hemosiderin staining appearance to both lower legs, dry flaky skin to B lower legs  NO OPEN WOUNDS NOTED TO RIGHT LOWER LEG  Dressing procedure/placement/frequency: Clean L lower leg wound with Vashe wound cleanser Soila [phone]), cut a piece of silver hydrofiber Soila [phone]) to fit wound bed daily, cover with dry gauze and silicone foam or Kerlix roll gauze.   Cover L heel area of eschar with Xeroform gauze Soila [phone]), cover with dry gauze and Kerlix roll gauze. Place L foot in Prevalon boot Soila [phone]) to offload pressure.   POC discussed with patient, bedside nurse and primary MD. WOC team will not follow. Re-consult if further needs arise.   Thank you,    Powell Bar MSN, RN-BC, [employer] [phone]

## 2024-02-02 NOTE — Plan of Care (Signed)
  Problem: Education: Goal: Knowledge of General Education information will improve Description: Including pain rating scale, medication(s)/side effects and non-pharmacologic comfort measures Outcome: Progressing   Problem: Health Behavior/Discharge Planning: Goal: Ability to manage health-related needs will improve Outcome: Progressing   Problem: Clinical Measurements: Goal: Ability to maintain clinical measurements within normal limits will improve Outcome: Progressing Goal: Will remain free from infection Outcome: Progressing Goal: Diagnostic test results will improve Outcome: Progressing Goal: Respiratory complications will improve Outcome: Progressing Goal: Cardiovascular complication will be avoided Outcome: Not Progressing   Problem: Activity: Goal: Risk for activity intolerance will decrease Outcome: Not Progressing   Problem: Nutrition: Goal: Adequate nutrition will be maintained Outcome: Progressing   Problem: Coping: Goal: Level of anxiety will decrease Outcome: Progressing   Problem: Elimination: Goal: Will not experience complications related to bowel motility Outcome: Progressing Goal: Will not experience complications related to urinary retention Outcome: Progressing   Problem: Pain Managment: Goal: General experience of comfort will improve and/or be controlled Outcome: Progressing   Problem: Safety: Goal: Ability to remain free from injury will improve Outcome: Progressing   Problem: Skin Integrity: Goal: Risk for impaired skin integrity will decrease Outcome: Progressing

## 2024-02-02 NOTE — Progress Notes (Addendum)
 PROGRESS NOTE  Caleb [name] FMW:[id number] DOB: [birth year] DOA: 02/01/2024 PCP: Tish Elsie FALCON, MD  Brief History:  89 year old gentleman longtime resident of the [facility] nursing center with history of coronary artery disease status post CABG, peripheral vascular disease with critical ischemia in the lower extremities no longer an operative candidate, hyperlipidemia, history of penile cancer status post partial penectomy with chronic indwelling Foley catheter, chronic dependent edema in the legs, GERD, hypertension, DNR, physical debility and frequent falls, paroxysmal A-fib not anticoagulated due to history of GI bleeding 2022, stage IIIb CKD, neurogenic bladder apparently according to reports from family he has been dealing with an ulceration involving the left leg for about 5 weeks.  It started as a blister and subsequently has gotten larger and larger and now has become infected.  The lesion thought to be secondary to poor circulation as a result of the critical limb ischemia that he is dealing with. Patient is No longer ambulatory and has to be fed by staff members.  He was noted to have a low-grade fever and upper respiratory symptoms over the past several days and was seen by the practitioner at the [facility] on 01/30/2023 and noted to have a WBC of 19.3.  He was noted to have a red hot right lower extremity concerning for cellulitis.  He was started on Zosyn om 01/31/24  He was subsequently sent to ED by EMS due to hypotension and tachycardia with concern for sepsis.  He arrived with low blood pressure and tachycardic heart rate with an elevated lactic acid of 4.1.  Code sepsis called and he was hydrated with IV fluids and started on broad-spectrum antibiotic therapy after blood cultures.  GOC Discussion with his son at time of admission by ED physician and admitting physician and he was agreeable to a trial of IV fluid and IV antibiotics but did not want to escalate care beyond that.  If he  did not improve would want the focus of patient's care to be primarily on comfort and dignity.  He requested pain and anxiety medications now.  He was agreeable to revisit patient's progress in 24 hours to decide if transition to full comfort measures desired.   Assessment/Plan: Severe sepsis -Secondary to UTI and cellulitis of the leg -Presented with leukocytosis and tachycardia with lactic acid of 4.1 -UA 21-50 WBC -Follow urine culture -Personally reviewed chest x-ray--no infiltrates or edema -01/31/2024 blood culture--group B streptococcus -Initially started on vancomycin , cefepime , metronidazole  -Discontinue vancomycin  metronidazole   CAUTI -Patient has chronic indwelling Foley catheter -Continue ceftriaxone  pending urine culture data  Group B streptococcus bacteremia -Source is likely leg infection vs PICC -Patient had a PICC line prior to admission -Follow repeat blood cultures  Chronic ulceration left lower extremity -Does not appear to be infected on examination -secondary to known critical ischemia in the legs -wound care RN consult appreciated -he is not an operative candidate for invasive procedures -treating supportively  Acute on chronic renal failure--CKD stage IIIb -Baseline creatinine 1.4-1.7 -Presented with serum creatinine 2.93 -Secondary to sepsis and hemodynamic changes by depletion -Foley catheter exchanged to 425  Paroxysmal atrial fibrillation with RVR/acquired thrombophilia -Continue apixaban  -Restart metoprolol  once BP improves  Coronary disease -No chest pain presently  Chronic constipation -Continue daily MiraLAX   History of penile cancer/neurogenic bladder -Patient has chronic indwelling Foley          Family Communication:  no Family at bedside  Consultants:  none  Code  Status:  DNR  DVT Prophylaxis:  apixaban    Procedures: As Listed in Progress Note Above  Antibiotics: Vanc 2/4 Cefepime  2/4 Ceftriaxone   2/5>>     The patient is critically ill with multiple organ systems failure and requires high complexity decision making for assessment and support, frequent evaluation and titration of therapies, application of advanced monitoring technologies and extensive interpretation of multiple databases.  Critical care time - 35 mins.     Subjective: Patient denies fevers, chills, headache, chest pain, dyspnea, nausea, vomiting, diarrhea, abdominal pain,   Objective: Vitals:   02/02/24 0545 02/02/24 0600 02/02/24 0615 02/02/24 0743  BP: (!) 93/48 (!) 96/55 (!) 94/55   Pulse: (!) 106 (!) 103 71   Resp: 16 16 18    Temp:    98 F (36.7 C)  TempSrc:    Oral  SpO2: 100% 99% 99%   Weight:      Height:        Intake/Output Summary (Last 24 hours) at 02/02/2024 0811 Last data filed at 02/02/2024 0630 Gross per 24 hour  Intake 3111.69 ml  Output 700 ml  Net 2411.69 ml   Weight change:  Exam:  General:  Pt is alert, follows commands appropriately, not in acute distress HEENT: No icterus, No thrush, No neck mass, [place]/AT Cardiovascular: RRR, S1/S2, no rubs, no gallops Respiratory: scattered rales.  No wheeze Abdomen: Soft/+BS, non tender, non distended, no guarding Extremities: 2 + LE edema, No lymphangitis, No petechiae, No rashes, no synovitis   Data Reviewed: I have personally reviewed following labs and imaging studies Basic Metabolic Panel: Recent Labs  Lab 01/31/24 0530 02/01/24 1105 02/02/24 0349  NA 136 136 135  K 4.9 4.6 4.3  CL 102 103 109  CO2 20* 20* 20*  GLUCOSE 129* 183* 103*  BUN 38* 56* 49*  CREATININE 1.96* 2.93* 2.00*  CALCIUM  9.0 8.7* 7.6*  MG  --   --  2.2   Liver Function Tests: Recent Labs  Lab 02/01/24 1105 02/02/24 0349  AST 127* 89*  ALT 97* 80*  ALKPHOS 50 44  BILITOT 0.7 0.7  PROT 6.6 5.4*  ALBUMIN  3.0* 2.5*   No results for input(s): LIPASE, AMYLASE in the last 168 hours. No results for input(s): AMMONIA in the last 168  hours. Coagulation Profile: No results for input(s): INR, PROTIME in the last 168 hours. CBC: Recent Labs  Lab 01/31/24 0530 02/01/24 1105 02/02/24 0349  WBC 19.0* 19.3* 13.9*  NEUTROABS 18.4* 17.8* 12.2*  HGB 11.3* 10.6* 8.9*  HCT 35.8* 32.7* 28.4*  MCV 100.6* 101.6* 99.6  PLT 152 128* 117*   Cardiac Enzymes: No results for input(s): CKTOTAL, CKMB, CKMBINDEX, TROPONINI in the last 168 hours. BNP: Invalid input(s): POCBNP CBG: No results for input(s): GLUCAP in the last 168 hours. HbA1C: No results for input(s): HGBA1C in the last 72 hours. Urine analysis:    Component Value Date/Time   COLORURINE YELLOW 02/01/2024 1550   APPEARANCEUR CLOUDY (A) 02/01/2024 1550   APPEARANCEUR Cloudy (A) 08/18/2023 1458   LABSPEC 1.024 02/01/2024 1550   PHURINE 5.0 02/01/2024 1550   GLUCOSEU NEGATIVE 02/01/2024 1550   HGBUR SMALL (A) 02/01/2024 1550   BILIRUBINUR NEGATIVE 02/01/2024 1550   BILIRUBINUR Negative 08/18/2023 1458   KETONESUR NEGATIVE 02/01/2024 1550   PROTEINUR 30 (A) 02/01/2024 1550   UROBILINOGEN 0.2 03/25/2020 1156   NITRITE NEGATIVE 02/01/2024 1550   LEUKOCYTESUR MODERATE (A) 02/01/2024 1550   Sepsis Labs: @LABRCNTIP (procalcitonin:4,lacticidven:4) ) Recent Results (from the past 240 hours)  Resp panel by RT-PCR (RSV, Flu A&B, Covid)     Status: None   Collection Time: 01/30/24  6:42 PM  Result Value Ref Range Status   SARS Coronavirus 2 by RT PCR NEGATIVE NEGATIVE Final    Comment: (NOTE) SARS-CoV-2 target nucleic acids are NOT DETECTED.  The SARS-CoV-2 RNA is generally detectable in upper respiratory specimens during the acute phase of infection. The lowest concentration of SARS-CoV-2 viral copies this assay can detect is 138 copies/mL. A negative result does not preclude SARS-Cov-2 infection and should not be used as the sole basis for treatment or other patient management decisions. A negative result may occur with  improper specimen  collection/handling, submission of specimen other than nasopharyngeal swab, presence of viral mutation(s) within the areas targeted by this assay, and inadequate number of viral copies(<138 copies/mL). A negative result must be combined with clinical observations, patient history, and epidemiological information. The expected result is Negative.  Fact Sheet for Patients:  bloggercourse.com  Fact Sheet for Healthcare Providers:  seriousbroker.it  This test is no t yet approved or cleared by the [place]  FDA and  has been authorized for detection and/or diagnosis of SARS-CoV-2 by FDA under an Emergency Use Authorization (EUA). This EUA will remain  in effect (meaning this test can be used) for the duration of the COVID-19 declaration under Section 564(b)(1) of the Act, 21 U.S.C.section 360bbb-3(b)(1), unless the authorization is terminated  or revoked sooner.       Influenza A by PCR NEGATIVE NEGATIVE Final   Influenza B by PCR NEGATIVE NEGATIVE Final    Comment: (NOTE) The Xpert Xpress SARS-CoV-2/FLU/RSV plus assay is intended as an aid in the diagnosis of influenza from Nasopharyngeal swab specimens and should not be used as a sole basis for treatment. Nasal washings and aspirates are unacceptable for Xpert Xpress SARS-CoV-2/FLU/RSV testing.  Fact Sheet for Patients: bloggercourse.com  Fact Sheet for Healthcare Providers: seriousbroker.it  This test is not yet approved or cleared by the [place]  FDA and has been authorized for detection and/or diagnosis of SARS-CoV-2 by FDA under an Emergency Use Authorization (EUA). This EUA will remain in effect (meaning this test can be used) for the duration of the COVID-19 declaration under Section 564(b)(1) of the Act, 21 U.S.C. section 360bbb-3(b)(1), unless the authorization is terminated or revoked.     Resp Syncytial  Virus by PCR NEGATIVE NEGATIVE Final    Comment: (NOTE) Fact Sheet for Patients: bloggercourse.com  Fact Sheet for Healthcare Providers: seriousbroker.it  This test is not yet approved or cleared by the [place]  FDA and has been authorized for detection and/or diagnosis of SARS-CoV-2 by FDA under an Emergency Use Authorization (EUA). This EUA will remain in effect (meaning this test can be used) for the duration of the COVID-19 declaration under Section 564(b)(1) of the Act, 21 U.S.C. section 360bbb-3(b)(1), unless the authorization is terminated or revoked.  Performed at [facility], [address], [place], [place] [address]   Culture, blood (Routine X 2) w Reflex to ID Panel     Status: Abnormal (Preliminary result)   Collection Time: 01/31/24 10:30 AM   Specimen: BLOOD  Result Value Ref Range Status   Specimen Description   Final    BLOOD RIGHT ARM Performed at [facility], [address], [place], [place] [address]    Special Requests   Final    BOTTLES DRAWN AEROBIC AND ANAEROBIC Blood Culture results may not be optimal due to an inadequate volume of blood received  in culture bottles Performed at [facility], [address] [place], [place] [address]    Culture  Setup Time   Final    GRAM POSITIVE COCCI IN BOTH AEROBIC AND ANAEROBIC BOTTLES Gram Stain Report Called to,Read Back By and Verified With: KIM CHILDS @ [facility] [phone], VIRAY,J CRITICAL RESULT CALLED TO, READ BACK BY AND VERIFIED WITH: M TARAWALLY,RN@0637  02/01/24 MK Performed at [facility], [address], [place], [place] [address]    Culture STREPTOCOCCUS AGALACTIAE (A)  Final   Report Status PENDING  Incomplete   Organism ID, Bacteria STREPTOCOCCUS AGALACTIAE  Final      Susceptibility   Streptococcus agalactiae - MIC*    CLINDAMYCIN >=1 RESISTANT Resistant     AMPICILLIN <=0.25 SENSITIVE Sensitive     ERYTHROMYCIN >=8 RESISTANT Resistant      VANCOMYCIN  0.5 SENSITIVE Sensitive     CEFTRIAXONE  <=0.12 SENSITIVE Sensitive     LEVOFLOXACIN 1 SENSITIVE Sensitive     PENICILLIN <=0.06 SENSITIVE Sensitive     * STREPTOCOCCUS AGALACTIAE  Blood Culture ID Panel (Reflexed)     Status: Abnormal   Collection Time: 01/31/24 10:30 AM  Result Value Ref Range Status   Enterococcus faecalis NOT DETECTED NOT DETECTED Final   Enterococcus Faecium NOT DETECTED NOT DETECTED Final   Listeria monocytogenes NOT DETECTED NOT DETECTED Final   Staphylococcus species NOT DETECTED NOT DETECTED Final   Staphylococcus aureus (BCID) NOT DETECTED NOT DETECTED Final   Staphylococcus epidermidis NOT DETECTED NOT DETECTED Final   Staphylococcus lugdunensis NOT DETECTED NOT DETECTED Final   Streptococcus species DETECTED (A) NOT DETECTED Final    Comment: CRITICAL RESULT CALLED TO, READ BACK BY AND VERIFIED WITH: M TARAWALLY,RN@0635  02/01/24 MK    Streptococcus agalactiae DETECTED (A) NOT DETECTED Final    Comment: CRITICAL RESULT CALLED TO, READ BACK BY AND VERIFIED WITH: M TARAWALLY,RN@0635  02/01/24 MK    Streptococcus pneumoniae NOT DETECTED NOT DETECTED Final   Streptococcus pyogenes NOT DETECTED NOT DETECTED Final   A.calcoaceticus-baumannii NOT DETECTED NOT DETECTED Final   Bacteroides fragilis NOT DETECTED NOT DETECTED Final   Enterobacterales NOT DETECTED NOT DETECTED Final   Enterobacter cloacae complex NOT DETECTED NOT DETECTED Final   Escherichia coli NOT DETECTED NOT DETECTED Final   Klebsiella aerogenes NOT DETECTED NOT DETECTED Final   Klebsiella oxytoca NOT DETECTED NOT DETECTED Final   Klebsiella pneumoniae NOT DETECTED NOT DETECTED Final   Proteus species NOT DETECTED NOT DETECTED Final   Salmonella species NOT DETECTED NOT DETECTED Final   Serratia marcescens NOT DETECTED NOT DETECTED Final   Haemophilus influenzae NOT DETECTED NOT DETECTED Final   Neisseria meningitidis NOT DETECTED NOT DETECTED Final   Pseudomonas aeruginosa NOT  DETECTED NOT DETECTED Final   Stenotrophomonas maltophilia NOT DETECTED NOT DETECTED Final   Candida albicans NOT DETECTED NOT DETECTED Final   Candida auris NOT DETECTED NOT DETECTED Final   Candida glabrata NOT DETECTED NOT DETECTED Final   Candida krusei NOT DETECTED NOT DETECTED Final   Candida parapsilosis NOT DETECTED NOT DETECTED Final   Candida tropicalis NOT DETECTED NOT DETECTED Final   Cryptococcus neoformans/gattii NOT DETECTED NOT DETECTED Final    Comment: Performed at [facility], [address], [place], [place] [address]  Culture, blood (Routine X 2) w Reflex to ID Panel     Status: Abnormal (Preliminary result)   Collection Time: 01/31/24 10:50 AM   Specimen: BLOOD  Result Value Ref Range Status   Specimen Description  Final    BLOOD RIGHT HAND Performed at [facility], [address], [place], [place] [address]    Special Requests   Final    BOTTLES DRAWN AEROBIC AND ANAEROBIC Blood Culture results may not be optimal due to an inadequate volume of blood received in culture bottles Performed at [facility], [address], [place], [place] [address]    Culture  Setup Time   Final    GRAM POSITIVE COCCI IN BOTH AEROBIC AND ANAEROBIC BOTTLES PREV CALLED:  K. CHILDS [phone], VIRAY,J Performed at [facility], [address], [place], [place] [address]    Culture (A)  Final    GROUP B STREP(S.AGALACTIAE)ISOLATED SUSCEPTIBILITIES PERFORMED ON PREVIOUS CULTURE WITHIN THE LAST 5 DAYS. Performed at [facility], [address], [place], [place] [address]    Report Status PENDING  Incomplete  Blood Culture (routine x 2)     Status: None (Preliminary result)   Collection Time: 02/01/24 10:56 AM   Specimen: BLOOD  Result Value Ref Range Status   Specimen Description BLOOD RIGHT ARM  Final   Special Requests   Final    BOTTLES DRAWN AEROBIC ONLY Blood Culture adequate volume   Culture   Final    NO GROWTH < 24 HOURS Performed at [facility],  [address], [place], [place] [address]    Report Status PENDING  Incomplete  Resp panel by RT-PCR (RSV, Flu A&B, Covid) Anterior Nasal Swab     Status: None   Collection Time: 02/01/24 11:00 AM   Specimen: Anterior Nasal Swab  Result Value Ref Range Status   SARS Coronavirus 2 by RT PCR NEGATIVE NEGATIVE Final    Comment: (NOTE) SARS-CoV-2 target nucleic acids are NOT DETECTED.  The SARS-CoV-2 RNA is generally detectable in upper respiratory specimens during the acute phase of infection. The lowest concentration of SARS-CoV-2 viral copies this assay can detect is 138 copies/mL. A negative result does not preclude SARS-Cov-2 infection and should not be used as the sole basis for treatment or other patient management decisions. A negative result may occur with  improper specimen collection/handling, submission of specimen other than nasopharyngeal swab, presence of viral mutation(s) within the areas targeted by this assay, and inadequate number of viral copies(<138 copies/mL). A negative result must be combined with clinical observations, patient history, and epidemiological information. The expected result is Negative.  Fact Sheet for Patients:  bloggercourse.com  Fact Sheet for Healthcare Providers:  seriousbroker.it  This test is no t yet approved or cleared by the [place]  FDA and  has been authorized for detection and/or diagnosis of SARS-CoV-2 by FDA under an Emergency Use Authorization (EUA). This EUA will remain  in effect (meaning this test can be used) for the duration of the COVID-19 declaration under Section 564(b)(1) of the Act, 21 U.S.C.section 360bbb-3(b)(1), unless the authorization is terminated  or revoked sooner.       Influenza A by PCR NEGATIVE NEGATIVE Final   Influenza B by PCR NEGATIVE NEGATIVE Final    Comment: (NOTE) The Xpert Xpress SARS-CoV-2/FLU/RSV plus assay is intended as an aid in the  diagnosis of influenza from Nasopharyngeal swab specimens and should not be used as a sole basis for treatment. Nasal washings and aspirates are unacceptable for Xpert Xpress SARS-CoV-2/FLU/RSV testing.  Fact Sheet for Patients: bloggercourse.com  Fact Sheet for Healthcare Providers: seriousbroker.it  This test is not yet approved or cleared by the [place]  FDA and has been authorized for detection and/or  diagnosis of SARS-CoV-2 by FDA under an Emergency Use Authorization (EUA). This EUA will remain in effect (meaning this test can be used) for the duration of the COVID-19 declaration under Section 564(b)(1) of the Act, 21 U.S.C. section 360bbb-3(b)(1), unless the authorization is terminated or revoked.     Resp Syncytial Virus by PCR NEGATIVE NEGATIVE Final    Comment: (NOTE) Fact Sheet for Patients: bloggercourse.com  Fact Sheet for Healthcare Providers: seriousbroker.it  This test is not yet approved or cleared by the [place]  FDA and has been authorized for detection and/or diagnosis of SARS-CoV-2 by FDA under an Emergency Use Authorization (EUA). This EUA will remain in effect (meaning this test can be used) for the duration of the COVID-19 declaration under Section 564(b)(1) of the Act, 21 U.S.C. section 360bbb-3(b)(1), unless the authorization is terminated or revoked.  Performed at [facility], [address], [place], [place] [address]   Blood Culture (routine x 2)     Status: None (Preliminary result)   Collection Time: 02/01/24 11:05 AM   Specimen: BLOOD  Result Value Ref Range Status   Specimen Description BLOOD BLOOD LEFT ARM wrist  Final   Special Requests   Final    BOTTLES DRAWN AEROBIC AND ANAEROBIC Blood Culture adequate volume   Culture   Final    NO GROWTH < 24 HOURS Performed at [facility], [address], [place], [place] [address]     Report Status PENDING  Incomplete  MRSA Next Gen by PCR, Nasal     Status: None   Collection Time: 02/01/24  2:01 PM   Specimen: Nasal Mucosa; Nasal Swab  Result Value Ref Range Status   MRSA by PCR Next Gen NOT DETECTED NOT DETECTED Final    Comment: (NOTE) The GeneXpert MRSA Assay (FDA approved for NASAL specimens only), is one component of a comprehensive MRSA colonization surveillance program. It is not intended to diagnose MRSA infection nor to guide or monitor treatment for MRSA infections. Test performance is not FDA approved in patients less than 90 years old. Performed at [facility], [address] [place] [address] [place],  [address]      Scheduled Meds:  apixaban   2.5 mg Oral BID   Chlorhexidine  Gluconate Cloth  6 each Topical Q0600   hydrocerin   Topical BID   midodrine   10 mg Oral TID WC   pantoprazole  (PROTONIX ) IV  40 mg Intravenous Q24H   Continuous Infusions:  sodium chloride      ceFEPime  (MAXIPIME ) IV     lactated ringers  125 mL/hr at 02/02/24 0630   metronidazole  Stopped (02/01/24 2148)   norepinephrine  (LEVOPHED ) Adult infusion     [START ON 02/03/2024] vancomycin       Procedures/Studies: DG Chest Port 1 View Result Date: 02/01/2024 CLINICAL DATA:  Questionable sepsis - evaluate for abnormality. Hypotension and tachycardia. EXAM: PORTABLE CHEST 1 VIEW COMPARISON:  08/29/2023. FINDINGS: Low lung volume. There are nonspecific opacities throughout bilateral lungs, grossly similar to the prior study favored to represent underlying fibrosis/scarring. No frank pulmonary edema. No acute dense consolidation or lung collapse. Bilateral costophrenic angles are clear. Note is made of elevated left hemidiaphragm. Stable cardio-mediastinal silhouette. No acute osseous abnormalities. The soft tissues are within normal limits. IMPRESSION: No active disease. Electronically Signed   By: Ree Molt M.D.   On: 02/01/2024 11:54    Alm Schneider, DO  [facility]  If 7PM-7AM, please  contact night-coverage [email or web address] Password [email or web address] 02/02/2024, 8:11 AM   LOS: 1 day

## 2024-02-02 NOTE — TOC Initial Note (Signed)
 Transition of Care [facility]) - Initial/Assessment Note    Patient Details  Name: Caleb [name] MRN: [id number] Date of Birth: Jul 24, 1925  Transition of Care [facility]) CM/SW Contact:    Mcarthur Saddie Kim, LCSW Phone Number: 02/02/2024, 10:19 AM  Clinical Narrative: Pt admitted due to severe sepsis. Pt has been a resident at [facility] for the past 6 months. Son, Elsie requests return to [facility] if pt improves. He plans to call pt's RN to check on status. Per Kirke at [facility], okay for return. FL2 completed. TOC will continue to follow.                   Expected Discharge Plan: Skilled Nursing Facility Barriers to Discharge: Continued Medical Work up   Patient Goals and CMS Choice Patient states their goals for this hospitalization and ongoing recovery are:: hopeful for return to SNF   Choice offered to / list presented to : Adult Children [place] ownership interest in [facility].provided to::  (current resident at [facility])    Expected Discharge Plan and Services In-house Referral: Clinical Social Work   Post Acute Care Choice: Skilled Nursing Facility Living arrangements for the past 2 months: Skilled Nursing Facility                                      Prior Living Arrangements/Services Living arrangements for the past 2 months: Skilled Nursing Facility Lives with:: Facility Resident Patient language and need for interpreter reviewed:: Yes        Need for Family Participation in Patient Care: Yes (Comment) Care giver support system in place?: Yes (comment)   Criminal Activity/Legal Involvement Pertinent to Current Situation/Hospitalization: No - Comment as needed  Activities of Daily Living   ADL Screening (condition at time of admission) Independently performs ADLs?: No Does the patient have a NEW difficulty with bathing/dressing/toileting/self-feeding that is expected to last >3 days?: Yes (Initiates electronic notice to provider for possible OT consult) Does  the patient have a NEW difficulty with getting in/out of bed, walking, or climbing stairs that is expected to last >3 days?: Yes (Initiates electronic notice to provider for possible PT consult) Does the patient have a NEW difficulty with communication that is expected to last >3 days?: Yes (Initiates electronic notice to provider for possible SLP consult) Is the patient deaf or have difficulty hearing?: No Does the patient have difficulty seeing, even when wearing glasses/contacts?: No Does the patient have difficulty concentrating, remembering, or making decisions?: Yes  Permission Sought/Granted         Permission granted to share info w AGENCY: [facility]  Permission granted to share info w Relationship: SNF     Emotional Assessment         Alcohol / Substance Use: Not Applicable Psych Involvement: No (comment)  Admission diagnosis:  Severe sepsis (HCC) [A41.9, R65.20] Patient Active Problem List   Diagnosis Date Noted   Sepsis due to group B Streptococcus (HCC) 02/02/2024   Severe sepsis (HCC) 02/01/2024   Acute renal failure superimposed on stage 3b chronic kidney disease (HCC) 02/01/2024   DNR (do not resuscitate) 02/01/2024   Lactic acidosis 02/01/2024   Group B Strep Bacteremia 02/01/2024   Elevated liver enzymes 02/01/2024   Hyperglycemia 02/01/2024   Positive blood culture 02/01/2024   Acquired thrombophilia (HCC) 02/01/2024   FTT (failure to thrive) in adult 02/01/2024   Cellulitis of right thigh 12/02/2023  Cellulitis of right lower leg 10/12/2023   Leg wound, left 09/22/2023   Edema of both lower extremities due to peripheral venous insufficiency 09/22/2023   Vascular dementia without behavioral disturbance (HCC) 09/17/2023   SIADH (syndrome of inappropriate ADH production) (HCC) 09/03/2023   Chronic constipation 09/03/2023   Aortic calcification (HCC) 09/03/2023   History of penectomy 09/02/2023   Chronic indwelling Foley catheter 09/02/2023   Status post  urethrostomy (HCC) 09/02/2023   Hyponatremia 08/29/2023   ACS (acute coronary syndrome) (HCC) 07/01/2023   Multiple duodenal ulcers 07/31/2021   Neurocognitive deficits 07/31/2021   Hyperkalemia 07/19/2021   Leukocytosis 07/19/2021   Acute blood loss anemia 07/19/2021   Transient hypotension 07/19/2021   Antiplatelet or antithrombotic long-term use    CKD (chronic kidney disease), stage III (HCC) 07/17/2021   Macrocytic anemia 07/17/2021   Atrial fibrillation (HCC) 07/17/2021   Fall at home, initial encounter 07/11/2021   Urinary retention 03/27/2020   Exudative age-related macular degeneration, right eye, with active choroidal neovascularization (HCC) 08/23/2017   BMI 32.0-32.9,adult 02/26/2016   Neurogenic bladder 02/26/2016   Scalp hematoma 01/13/2016   Atypical chest pain 12/02/2015   Muscular deconditioning 02/20/2015   History of penile cancer 01/09/2015   Cellulitis 05/17/2014   Pain in limb- Slight pain- Left Leg 05/03/2014   Discoloration of skin-Left Leg 05/03/2014   Stasis edema with ulcer (HCC) 08/17/2013   Peripheral vascular disease, unspecified (HCC) 07/13/2013   Swelling of limb 07/13/2013   Coronary artery disease 06/07/2013   Left upper extremity numbness 06/07/2013   Hyperlipidemia    PCP:  Tish Elsie FALCON, MD Pharmacy:   [facility] [place], [place] - [address] [place] [address] Phone: [phone] Fax: [phone]     Social Drivers of Health (SDOH) Social History: SDOH Screenings   Food Insecurity: No Food Insecurity (02/01/2024)  Housing: Unknown (02/01/2024)  Transportation Needs: No Transportation Needs (02/01/2024)  Utilities: Not At Risk (02/01/2024)  Depression (PHQ2-9): Low Risk  (01/27/2024)  Social Connections: Unknown (02/01/2024)  Tobacco Use: Medium Risk (02/01/2024)   SDOH Interventions:     Readmission Risk Interventions    02/02/2024   10:19 AM 07/15/2021    1:30 PM  07/14/2021   10:40 AM  Readmission Risk Prevention Plan  Post Dischage Appt  Complete   Medication Screening   Complete  Transportation Screening Complete  Complete  HRI or Home Care Consult Complete    Social Work Consult for Recovery Care Planning/Counseling Complete    Medication Review Oceanographer) Complete

## 2024-02-02 NOTE — [place] FL2 (Signed)
 [place]  MEDICAID FL2 LEVEL OF CARE FORM     IDENTIFICATION  Patient Name: Caleb [name] Birthdate: 1925/03/04 Sex: male Admission Date (Current Location): 02/01/2024  [facility] and [place] Number:  [employer] and Address:  [facility],  [address], [place] [address]      Provider Number: [id number]  Attending Physician Name and Address:  Evonnie Lenis, MD  Relative Name and Phone Number:       Current Level of Care: Hospital Recommended Level of Care: Skilled Nursing Facility Prior Approval Number:    Date Approved/Denied:   PASRR Number:    Discharge Plan: SNF    Current Diagnoses: Patient Active Problem List   Diagnosis Date Noted   Sepsis due to group B Streptococcus (HCC) 02/02/2024   Severe sepsis (HCC) 02/01/2024   Acute renal failure superimposed on stage 3b chronic kidney disease (HCC) 02/01/2024   DNR (do not resuscitate) 02/01/2024   Lactic acidosis 02/01/2024   Group B Strep Bacteremia 02/01/2024   Elevated liver enzymes 02/01/2024   Hyperglycemia 02/01/2024   Positive blood culture 02/01/2024   Acquired thrombophilia (HCC) 02/01/2024   FTT (failure to thrive) in adult 02/01/2024   Cellulitis of right thigh 12/02/2023   Cellulitis of right lower leg 10/12/2023   Leg wound, left 09/22/2023   Edema of both lower extremities due to peripheral venous insufficiency 09/22/2023   Vascular dementia without behavioral disturbance (HCC) 09/17/2023   SIADH (syndrome of inappropriate ADH production) (HCC) 09/03/2023   Chronic constipation 09/03/2023   Aortic calcification (HCC) 09/03/2023   History of penectomy 09/02/2023   Chronic indwelling Foley catheter 09/02/2023   Status post urethrostomy (HCC) 09/02/2023   Hyponatremia 08/29/2023   ACS (acute coronary syndrome) (HCC) 07/01/2023   Multiple duodenal ulcers 07/31/2021   Neurocognitive deficits 07/31/2021   Hyperkalemia 07/19/2021   Leukocytosis 07/19/2021   Acute blood loss  anemia 07/19/2021   Transient hypotension 07/19/2021   Antiplatelet or antithrombotic long-term use    CKD (chronic kidney disease), stage III (HCC) 07/17/2021   Macrocytic anemia 07/17/2021   Atrial fibrillation (HCC) 07/17/2021   Fall at home, initial encounter 07/11/2021   Urinary retention 03/27/2020   Exudative age-related macular degeneration, right eye, with active choroidal neovascularization (HCC) 08/23/2017   BMI 32.0-32.9,adult 02/26/2016   Neurogenic bladder 02/26/2016   Scalp hematoma 01/13/2016   Atypical chest pain 12/02/2015   Muscular deconditioning 02/20/2015   History of penile cancer 01/09/2015   Cellulitis 05/17/2014   Pain in limb- Slight pain- Left Leg 05/03/2014   Discoloration of skin-Left Leg 05/03/2014   Stasis edema with ulcer (HCC) 08/17/2013   Peripheral vascular disease, unspecified (HCC) 07/13/2013   Swelling of limb 07/13/2013   Coronary artery disease 06/07/2013   Left upper extremity numbness 06/07/2013   Hyperlipidemia     Orientation RESPIRATION BLADDER Height & Weight     Self, Time, Situation, Place  Normal Indwelling catheter Weight: 256 lb 2.8 oz (116.2 kg) Height:  5' 10 (177.8 cm)  BEHAVIORAL SYMPTOMS/MOOD NEUROLOGICAL BOWEL NUTRITION STATUS      Incontinent Diet (See d/c summary)  AMBULATORY STATUS COMMUNICATION OF NEEDS Skin   Extensive Assist Verbally Bruising, Other (Comment) (Redness to leg and sacrum. Stage II to sacrum with foam dressing. Unstageable to left heel. Non-pressure wound to left pretibial with silver hydrofiber.)                       Personal Care Assistance Level of Assistance  Bathing, Feeding, Dressing Bathing Assistance: Maximum assistance Feeding assistance: Maximum assistance Dressing Assistance: Maximum assistance     Functional Limitations Info  Sight, Hearing, Speech Sight Info: Adequate Hearing Info: Adequate Speech Info: Adequate    SPECIAL CARE FACTORS FREQUENCY                        Contractures      Additional Factors Info  Code Status, Allergies Code Status Info: DNR- Limited Allergies Info: No known allergies           Current Medications (02/02/2024):  This is the current hospital active medication list Current Facility-Administered Medications  Medication Dose Route Frequency Provider Last Rate Last Admin   0.9 %  sodium chloride  infusion  250 mL Intravenous Continuous Johnson, Clanford L, MD       acetaminophen  (TYLENOL ) tablet 650 mg  650 mg Oral Q6H PRN Johnson, Clanford L, MD       Or   acetaminophen  (TYLENOL ) suppository 650 mg  650 mg Rectal Q6H PRN Johnson, Clanford L, MD       ALPRAZolam  (XANAX ) tablet 0.25 mg  0.25 mg Oral TID PRN Johnson, Clanford L, MD       amiodarone  (NEXTERONE ) 1.8 mg/mL load via infusion 150 mg  150 mg Intravenous Once Tat, Alm, MD       Followed by   amiodarone  (NEXTERONE  PREMIX) 360-4.14 MG/200ML-% (1.8 mg/mL) IV infusion  60 mg/hr Intravenous Continuous Tat, David, MD       Followed by   amiodarone  (NEXTERONE  PREMIX) 360-4.14 MG/200ML-% (1.8 mg/mL) IV infusion  30 mg/hr Intravenous Continuous Tat, David, MD       apixaban  (ELIQUIS ) tablet 2.5 mg  2.5 mg Oral BID Johnson, Clanford L, MD   2.5 mg at 02/02/24 9065   ceFEPIme  (MAXIPIME ) 2 g in sodium chloride  0.9 % 100 mL IVPB  2 g Intravenous Q24H Johnson, Clanford L, MD       Chlorhexidine  Gluconate Cloth 2 % PADS 6 each  6 each Topical Q0600 Vicci, Clanford L, MD   6 each at 02/02/24 9487   fentaNYL  (SUBLIMAZE ) injection 12.5-25 mcg  12.5-25 mcg Intravenous Q2H PRN Johnson, Clanford L, MD       hydrocerin (EUCERIN) cream   Topical BID Tat, Alm, MD       lactated ringers  infusion   Intravenous Continuous Vicci, Clanford L, MD 125 mL/hr at 02/02/24 0906 New Bag at 02/02/24 9093   metroNIDAZOLE  (FLAGYL ) IVPB 500 mg  500 mg Intravenous Q12H Johnson, Clanford L, MD   Stopped at 02/01/24 2148   midodrine  (PROAMATINE ) tablet 10 mg  10 mg Oral TID WC Mansy, Jan A, MD    10 mg at 02/02/24 9065   norepinephrine  (LEVOPHED ) 4mg  in (0.016 mg/mL) premix infusion  2-10 mcg/min Intravenous Titrated Johnson, Clanford L, MD       ondansetron  (ZOFRAN ) tablet 4 mg  4 mg Oral Q6H PRN Johnson, Clanford L, MD       Or   ondansetron  (ZOFRAN ) injection 4 mg  4 mg Intravenous Q6H PRN Johnson, Clanford L, MD       pantoprazole  (PROTONIX ) injection 40 mg  40 mg Intravenous Q24H Johnson, Clanford L, MD   40 mg at 02/01/24 1420   [START ON 02/03/2024] vancomycin  (VANCOREADY) IVPB 1250 mg/250 mL  1,250 mg Intravenous Q48H Johnson, Clanford L, MD         Discharge Medications: Please see discharge summary for a list of  discharge medications.  Relevant Imaging Results:  Relevant Lab Results:   Additional Information    Mcarthur Saddie Kim, LCSW

## 2024-02-03 DIAGNOSIS — A419 Sepsis, unspecified organism: Secondary | ICD-10-CM | POA: Diagnosis not present

## 2024-02-03 DIAGNOSIS — N179 Acute kidney failure, unspecified: Secondary | ICD-10-CM | POA: Diagnosis not present

## 2024-02-03 DIAGNOSIS — R600 Localized edema: Secondary | ICD-10-CM

## 2024-02-03 DIAGNOSIS — Z978 Presence of other specified devices: Secondary | ICD-10-CM

## 2024-02-03 DIAGNOSIS — I4891 Unspecified atrial fibrillation: Secondary | ICD-10-CM | POA: Diagnosis not present

## 2024-02-03 DIAGNOSIS — L03116 Cellulitis of left lower limb: Secondary | ICD-10-CM | POA: Diagnosis not present

## 2024-02-03 DIAGNOSIS — R6520 Severe sepsis without septic shock: Secondary | ICD-10-CM | POA: Diagnosis not present

## 2024-02-03 DIAGNOSIS — I959 Hypotension, unspecified: Secondary | ICD-10-CM | POA: Diagnosis not present

## 2024-02-03 DIAGNOSIS — N183 Chronic kidney disease, stage 3 unspecified: Secondary | ICD-10-CM

## 2024-02-03 DIAGNOSIS — N1832 Chronic kidney disease, stage 3b: Secondary | ICD-10-CM | POA: Diagnosis not present

## 2024-02-03 LAB — CULTURE, BLOOD (ROUTINE X 2)

## 2024-02-03 LAB — COMPREHENSIVE METABOLIC PANEL
ALT: 109 U/L — ABNORMAL HIGH (ref 0–44)
AST: 114 U/L — ABNORMAL HIGH (ref 15–41)
Albumin: 2.5 g/dL — ABNORMAL LOW (ref 3.5–5.0)
Alkaline Phosphatase: 118 U/L (ref 38–126)
Anion gap: 7 (ref 5–15)
BUN: 45 mg/dL — ABNORMAL HIGH (ref 8–23)
CO2: 21 mmol/L — ABNORMAL LOW (ref 22–32)
Calcium: 8 mg/dL — ABNORMAL LOW (ref 8.9–10.3)
Chloride: 107 mmol/L (ref 98–111)
Creatinine, Ser: 1.62 mg/dL — ABNORMAL HIGH (ref 0.61–1.24)
GFR, Estimated: 38 mL/min — ABNORMAL LOW (ref >60)
Glucose, Bld: 103 mg/dL — ABNORMAL HIGH (ref 70–99)
Potassium: 4.2 mmol/L (ref 3.5–5.1)
Sodium: 135 mmol/L (ref 135–145)
Total Bilirubin: 0.6 mg/dL (ref 0.0–1.2)
Total Protein: 5.5 g/dL — ABNORMAL LOW (ref 6.5–8.1)

## 2024-02-03 LAB — CBC WITH DIFFERENTIAL/PLATELET
Abs Immature Granulocytes: 0.09 10*3/uL — ABNORMAL HIGH (ref 0.00–0.07)
Basophils Absolute: 0 10*3/uL (ref 0.0–0.1)
Basophils Relative: 0 %
Eosinophils Absolute: 0 10*3/uL (ref 0.0–0.5)
Eosinophils Relative: 0 %
HCT: 30.2 % — ABNORMAL LOW (ref 39.0–52.0)
Hemoglobin: 9.8 g/dL — ABNORMAL LOW (ref 13.0–17.0)
Immature Granulocytes: 1 %
Lymphocytes Relative: 8 %
Lymphs Abs: 1 10*3/uL (ref 0.7–4.0)
MCH: 32.8 pg (ref 26.0–34.0)
MCHC: 32.5 g/dL (ref 30.0–36.0)
MCV: 101 fL — ABNORMAL HIGH (ref 80.0–100.0)
Monocytes Absolute: 0.9 10*3/uL (ref 0.1–1.0)
Monocytes Relative: 7 %
Neutro Abs: 9.9 10*3/uL — ABNORMAL HIGH (ref 1.7–7.7)
Neutrophils Relative %: 84 %
Platelets: 136 10*3/uL — ABNORMAL LOW (ref 150–400)
RBC: 2.99 MIL/uL — ABNORMAL LOW (ref 4.22–5.81)
RDW: 17 % — ABNORMAL HIGH (ref 11.5–15.5)
WBC: 11.8 10*3/uL — ABNORMAL HIGH (ref 4.0–10.5)
nRBC: 0 % (ref 0.0–0.2)

## 2024-02-03 LAB — MAGNESIUM: Magnesium: 2.3 mg/dL (ref 1.7–2.4)

## 2024-02-03 NOTE — Consult Note (Addendum)
 Cardiology Consultation   Patient ID: MEKEL HAVERSTOCK MRN: [id number]; DOB: 22-Jan-1925  Admit date: 02/01/2024 Date of Consult: 02/03/2024  PCP:  Tish Elsie FALCON, MD   [facility] Providers Cardiologist: Previously Dr. Court (2020) and evaluated by Dr. Claudene in 2024  Patient Profile:   CICERO NOY is a 89 y.o. male with a hx of CAD (s/p stenting to RCA in 2006, NSTEMI in 06/2023 and medical management pursued given age and comorbidities), paroxysmal atrial fibrillation, HTN, HLD, Stage 3 CKD and history of GIB who is being seen 02/03/2024 for the evaluation of atrial fibrillation with RVR at the request of Dr. Evonnie.  History of Present Illness:   Mr. Mcmanaway presented to [facility] on 02/01/2024 from the [facility] due to hypotension and tachycardia. Patient's family reported he had started to develop erythema along both legs a few days prior to arrival and had been febrile with a temperature of 101.2.   Initial labs showed WBC 19.3, Hgb 10.6, platelets 128, Na+ 136, K+ 4.6 and creatinine 2.93  (previously 1.45 in 12/2023). Negative for COVID and influenza. Blood cultures positive for streptococcus agalactiae. Initial lactic acid at 4.1 with repeat at 3.6 and 3.3. CXR with no active disease. EKG showed atrial fibrillation with RVR, heart rate 125 with known RBBB.  He was admitted for further management of severe sepsis which was felt to be due to left leg ulceration and cellulitis. Was started on IV antibiotics and IV fluids. He has been continued on Eliquis  2.5 mg twice daily and he did not receive any AV nodal blocking agents given intermittent hypotension. Did require initiation of IV Amiodarone  yesterday and did receive a 150 mg bolus followed by initiation of infusion. He has also has been started on Midodrine  10 mg 3 times daily due to hypotension.  Most recent labs today shows WBC has improved to 11.8. Magnesium  2.3. Creatinine improved to 1.62. AST remains elevated at  114 and ALT 109.  In talking with the patient today, he reports having chronic lower extremity edema for the past several months. By review of his PTA medications, he is listed as only being on Spironolactone  12.5 mg daily and he is unsure what he takes at the [facility]. He denies any associated chest pain or palpitations. He is not overly active at baseline but no specific respiratory issues. No specific orthopnea or PND. Today is his 89th birthday. He is looking forward to family visiting today.     Past Medical History:  Diagnosis Date   Coronary artery disease    Hyperlipidemia    Obesity    Penile cancer (HCC)    Swelling of extremity    Left Leg    Past Surgical History:  Procedure Laterality Date   APPENDECTOMY     BACK SURGERY     BIOPSY  07/22/2021   Procedure: BIOPSY;  Surgeon: Charlanne Groom, MD;  Location: [facility] ENDOSCOPY;  Service: Gastroenterology;;   CHOLECYSTECTOMY     CORONARY ANGIOPLASTY  11/2005   RCA PCI AND STENTING WITH A CYPHER DES   CORONARY ARTERY BYPASS GRAFT     ESOPHAGOGASTRODUODENOSCOPY (EGD) WITH PROPOFOL  N/A 07/22/2021   Procedure: ESOPHAGOGASTRODUODENOSCOPY (EGD) WITH PROPOFOL ;  Surgeon: Charlanne Groom, MD;  Location: [facility] ENDOSCOPY;  Service: Gastroenterology;  Laterality: N/A;   HIATAL HERNIA REPAIR     lymph node removal     NM MYOCAR MULTIPLE W/SPECT  11/26/2009   EF 62%. NORMAL MYOCARDIAL PERFUSION STUDY.   TRANSTHORACIC  ECHOCARDIOGRAM  12/22/2005   MILD TO MOD AORTIC SCLEROSIS W/O STENOSIS. MILD TO MODERATE MITRAL CALCIFICATION. LA- MILDLY DILATED.     Home Medications:  Prior to Admission medications   Medication Sig Start Date End Date Taking? Authorizing Provider  acetaminophen  (TYLENOL ) 325 MG tablet Take 650 mg by mouth every 6 (six) hours as needed for fever or moderate pain (pain score 4-6).   Yes [provider]  ELIQUIS  2.5 MG TABS tablet Take 2.5 mg by mouth 2 (two) times daily. 12/21/23  Yes [provider]   miconazole (MICOTIN) 2 % powder Apply topically in the morning and at bedtime.   Yes [provider]  Multiple Vitamins-Minerals (PRESERVISION AREDS 2) CAPS Take 1 tablet by mouth daily.   Yes [provider]  piperacillin-tazobactam 2.25 g in sodium chloride  0.9 % 50 mL Inject 2.25 g into the vein every 6 (six) hours. Right leg cellulitis 01/31/24 02/07/24 Yes [provider]  sennosides-docusate sodium  (SENOKOT-S) 8.6-50 MG tablet Take 1 tablet by mouth daily.   Yes [provider]  sodium chloride  1 g tablet Take 1 tablet (1 g total) by mouth 2 (two) times daily with a meal. 09/01/23  Yes Bryn Bernardino NOVAK, MD  spironolactone  (ALDACTONE ) 25 MG tablet Take 0.5 tablets (12.5 mg total) by mouth daily. 07/09/23  Yes Claudene Pacific, MD    Inpatient Medications: Scheduled Meds:  apixaban   2.5 mg Oral BID   Chlorhexidine  Gluconate Cloth  6 each Topical Q0600   hydrocerin   Topical BID   midodrine   10 mg Oral TID WC   pantoprazole  (PROTONIX ) IV  40 mg Intravenous Q24H   Continuous Infusions:  amiodarone  30 mg/hr (02/03/24 0423)   cefTRIAXone  (ROCEPHIN )  IV Stopped (02/02/24 1303)   norepinephrine  (LEVOPHED ) Adult infusion     PRN Meds: acetaminophen  **OR** acetaminophen , ALPRAZolam , fentaNYL  (SUBLIMAZE ) injection, ondansetron  **OR** ondansetron  (ZOFRAN ) IV  Allergies:   No Known Allergies  Social History:   Social History   Tobacco Use   Smoking status: Former    Current packs/day: 0.00    Types: Cigarettes    Quit date: 07/14/1983    Years since quitting: 40.5   Smokeless tobacco: Never  Substance Use Topics   Alcohol use: No     Family History:    Family History  Problem Relation Age of Onset   Cancer Father    Cancer Sister    Cancer Brother    Cancer Sister    Cancer Other      ROS:  Please see the history of present illness. All other ROS reviewed and negative.     Physical Exam/Data:   Vitals:   02/03/24 0500 02/03/24 0600 02/03/24  0700 02/03/24 0758  BP: 109/71 119/62 (!) 105/53   Pulse: 97  94   Resp: (!) 21 (!) 22 18   Temp: 97.7 F (36.5 C)   98.3 F (36.8 C)  TempSrc: Oral   Oral  SpO2: 100%  97%   Weight:      Height:        Intake/Output Summary (Last 24 hours) at 02/03/2024 1014 Last data filed at 02/03/2024 0423 Gross per 24 hour  Intake 1783.73 ml  Output 1325 ml  Net 458.73 ml      02/02/2024    5:00 AM 02/01/2024    2:19 PM 02/01/2024   10:43 AM  Last 3 Weights  Weight (lbs) 256 lb 2.8 oz 254 lb 6.6 oz 251 lb  Weight (kg) 116.2  kg 115.4 kg 113.853 kg     Body mass index is 36.76 kg/m.  General: Pleasant, elderly male appearing in no acute distress.  HEENT: normal Neck: no JVD Vascular: No carotid bruits; Distal pulses 2+ bilaterally Cardiac:  normal S1, S2; Irregularly irregular.  Lungs:  clear to auscultation bilaterally, no wheezing, rhonchi or rales  Abd: soft, nontender, no hepatomegaly  Ext: Chronic appearing, 2+ pitting edema.  Musculoskeletal:  No deformities, BUE and BLE strength normal and equal Skin: warm and dry  Neuro:  CNs 2-12 intact, no focal abnormalities noted Psych:  Normal affect   EKG:  The EKG was personally reviewed and demonstrates:  atrial fibrillation with RVR, heart rate 125 with known RBBB.  Telemetry:  Telemetry was personally reviewed and demonstrates:  Atrial fibrillation, HR in 90's to low-100's.   Relevant CV Studies:  Echocardiogram: 06/2023 IMPRESSIONS    1. Left ventricular ejection fraction, by estimation, is 55 to 60%. The  left ventricle has normal function. The left ventricle has no regional  wall motion abnormalities. There is moderate concentric left ventricular  hypertrophy. Left ventricular  diastolic parameters are consistent with Grade II diastolic dysfunction  (pseudonormalization).   2. Right ventricular systolic function is normal. The right ventricular  size is normal.   3. Left atrial size was severely dilated.   4. Right atrial  size was mildly dilated.   5. The mitral valve is degenerative. Mild to moderate mitral valve  regurgitation.   6. The aortic valve is tricuspid. There is moderate calcification of the  aortic valve. There is moderate thickening of the aortic valve. Aortic  valve regurgitation is not visualized. Mild aortic valve stenosis.   7. There is mild (Grade II) atheroma plaque involving the aortic root and  ascending aorta.   8. The inferior vena cava is dilated in size with <50% respiratory  variability, suggesting right atrial pressure of 15 mmHg.    Laboratory Data:  Chemistry Recent Labs  Lab 02/01/24 1105 02/02/24 0349 02/03/24 0643  NA 136 135 135  K 4.6 4.3 4.2  CL 103 109 107  CO2 20* 20* 21*  GLUCOSE 183* 103* 103*  BUN 56* 49* 45*  CREATININE 2.93* 2.00* 1.62*  CALCIUM  8.7* 7.6* 8.0*  MG  --  2.2 2.3  GFRNONAA 19* 30* 38*  ANIONGAP 13 6 7     Recent Labs  Lab 02/01/24 1105 02/02/24 0349 02/03/24 0643  PROT 6.6 5.4* 5.5*  ALBUMIN  3.0* 2.5* 2.5*  AST 127* 89* 114*  ALT 97* 80* 109*  ALKPHOS 50 44 118  BILITOT 0.7 0.7 0.6    Hematology Recent Labs  Lab 02/01/24 1105 02/02/24 0349 02/03/24 0643  WBC 19.3* 13.9* 11.8*  RBC 3.22* 2.85* 2.99*  HGB 10.6* 8.9* 9.8*  HCT 32.7* 28.4* 30.2*  MCV 101.6* 99.6 101.0*  MCH 32.9 31.2 32.8  MCHC 32.4 31.3 32.5  RDW 17.0* 16.8* 17.0*  PLT 128* 117* 136*    Radiology/Studies:  DG Chest Port 1 View Result Date: 02/01/2024 CLINICAL DATA:  Questionable sepsis - evaluate for abnormality. Hypotension and tachycardia. EXAM: PORTABLE CHEST 1 VIEW COMPARISON:  08/29/2023. FINDINGS: Low lung volume. There are nonspecific opacities throughout bilateral lungs, grossly similar to the prior study favored to represent underlying fibrosis/scarring. No frank pulmonary edema. No acute dense consolidation or lung collapse. Bilateral costophrenic angles are clear. Note is made of elevated left hemidiaphragm. Stable cardio-mediastinal  silhouette. No acute osseous abnormalities. The soft tissues are within normal limits. IMPRESSION: No  active disease. Electronically Signed   By: Ree Molt M.D.   On: 02/01/2024 11:54    Assessment and Plan:   1. Atrial Fibrillation with RVR - He has a history of paroxysmal atrial fibrillation but did go into atrial fibrillation with RVR this admission which is likely due to his acute illness. Given hypotension, he was started on IV Amiodarone  and rates are overall acceptable given his circumstances with heart rate typically in the 90's to low-100's. Options are limited given hypotension as this does not allow for the use of Cardizem or beta-blocker therapy. Would also avoid Digoxin given his AKI.  If rates become elevated, can rebolus Amiodarone . Of note, LFTs are slightly elevated with AST at 114 and ALT at 109 but likely due to sepsis. Would continue with Amiodarone  for now. Can obtain an updated echocardiogram prior to discharge but would wait until rates improve. - He has been continued on Eliquis  2.5 mg twice daily for anticoagulation which is the appropriate dose given his age of 24 and creatinine > 1.5.  2. CAD - He underwent stenting to the RCA in 2006 and did have an NSTEMI in 06/2023 and medical management was pursued given his age and comorbidities. He denies any recent anginal symptoms.  - Has not been on ASA given the need for anticoagulation. Not listed as being on statin therapy prior to admission and suspect this was previously discontinued given his age.   3. Lower Extremity Edema - He reports worsening symptoms over the past several months. Was only on Spironolactone  prior to admission and possibly due to hyponatremia in the past (hospitalized with this in 08/2023). He is listed as being +2.8 L thus far this admission and will require diuresis once BP improves.   4. Sepsis in the setting of Bacteremia and Cellulitis - He was started on broad-spectrum antibiotics upon  admission and this has been weaned to Ceftriaxone . By review of notes, also had a PICC line prior to admission which was removed.  Management per the admitting team.  5.  Hypotension - Likely due to sepsis at the time of admission. This has improved and he is currently on Midodrine  10 mg TID with PTA Spironolactone  currently being held.  6. Acute on Chronic Stage 3 CKD - Creatinine was elevated 2.93 on admission, improved to 1.62 today.   Risk Assessment/Risk Scores:    CHA2DS2-VASc Score = 4   This indicates a 4.8% annual risk of stroke. The patient's score is based upon: CHF History: 0 HTN History: 1 Diabetes History: 0 Stroke History: 0 Vascular Disease History: 1 Age Score: 2 Gender Score: 0     For questions or updates, please contact [facility] Please consult [email or web address] for contact info under    Signed, [name], PA-C  02/03/2024 10:14 AM   Attending note:  Patient seen and examined.  I reviewed his records and discussed the case with Ms. Strader PA-C, I agree with her above findings.  Mr. Wafer is a 89 year old resident of the [facility] with cardiac history outlined above, now admitted with sepsis in the setting of left leg cellulitis, also had PICC line in place prior to admission which was removed.  Found to be in rapid atrial fibrillation with known history of PAF.  He is on Eliquis  for stroke prophylaxis, CHA2DS2-VASc score is 4.  Blood pressures low at presentation, started on midodrine  and has been on IV amiodarone  with reasonable heart rate control this morning.  He does not  report any chest pain or palpitations.  On examination this morning he reports no shortness of breath or palpitations.  It [birth year] birthday today.  He spoke with his daughter by phone who is currently traveling in [place].  He is afebrile, heart rate 90s to low 100s in atrial fibrillation by telemetry, recent systolics 100-120 range.  Lungs exhibit decreased breath sounds  without wheezing.  Cardiac exam with irregularly irregular rhythm.  Pertinent lab work includes potassium 4.2, creatinine 1.62 down from 2.93, lactate 1.7 down from 4.1, WBC 11.8, hemoglobin 9.8, platelets 136, viral respiratory panel negative, blood cultures pending, urine culture with insignificant growth.  Chest x-ray reports no acute process.  ECG shows atrial fibrillation with RVR, right bundle branch block, and old inferior infarct pattern.  PAF presenting with persistent atrial fibrillation with RVR in the setting of sepsis.  Agree with IV amiodarone  temporarily mainly as heart rate control strategy, although this may help him to convert eventually.  Continue Eliquis  for stroke prophylaxis.  Does have history of CAD as discussed above, although no active angina at this time.  Follow-up echo ordered by primary team.  [name], M.D., F.A.C.C.

## 2024-02-03 NOTE — Progress Notes (Addendum)
 PROGRESS NOTE  Caleb [name] FMW:[id number] DOB: 02/22/1925 DOA: 02/01/2024 PCP: Tish Elsie FALCON, MD  Brief History:  89 year old gentleman longtime resident of the [facility] nursing center with history of vascular dementia, ADH, coronary artery disease status post CABG, peripheral vascular disease with critical ischemia in the lower extremities no longer an operative candidate, hyperlipidemia, history of penile cancer status post partial penectomy with chronic indwelling Foley catheter, chronic dependent edema in the legs, GERD, hypertension, DNR, physical debility and frequent falls, paroxysmal A-fib not anticoagulated due to history of GI bleeding 2022, stage IIIb CKD, neurogenic bladder apparently according to reports from family he has been dealing with an ulceration involving the left leg for about 5 weeks.  It started as a blister and subsequently has gotten larger and larger and now has become infected.  The lesion thought to be secondary to poor circulation as a result of the critical limb ischemia that he is dealing with. Patient is No longer ambulatory and has to be fed by staff members.  He was noted to have a low-grade fever and upper respiratory symptoms over the past several days and was seen by the practitioner at the [facility] on 01/30/2023 and noted to have a WBC of 19.3.  He was noted to have a red hot right lower extremity concerning for cellulitis.  He was started on Zosyn om 01/31/24  He was subsequently sent to ED by EMS due to hypotension and tachycardia with concern for sepsis.  He arrived with low blood pressure and tachycardic heart rate with an elevated lactic acid of 4.1.  Code sepsis called and he was hydrated with IV fluids and started on broad-spectrum antibiotic therapy after blood cultures.  GOC Discussion with his son at time of admission by ED physician and admitting physician and he was agreeable to a trial of IV fluid and IV antibiotics but did not want to escalate  care beyond that.  If he did not improve would want the focus of patient's care to be primarily on comfort and dignity.  He requested pain and anxiety medications now.  He was agreeable to revisit patient's progress in 24 hours to decide if transition to full comfort measures desired.   Assessment/Plan: Severe sepsis -Secondary to Bacteremia and cellulitis of the leg -Presented with leukocytosis and tachycardia with lactic acid of 4.1 -UA 21-50 WBC -Follow urine culture <10K colonies -Personally reviewed chest x-ray--no infiltrates or edema -01/31/2024 blood culture--group B streptococcus -Initially started on vancomycin , cefepime , metronidazole  -Discontinue vancomycin  metronidazole  and cefepime  -continue ceftriaxone  -02/03/24--pt refusing any further bloodwor  Pyuria -Patient has chronic indwelling Foley catheter -culture <10K   Group B streptococcus bacteremia -Source is likely leg infection vs PICC -Patient had a PICC line prior to admission--removed 2/4 -Follow repeat blood cultures--neg to date -continue ceftriaxone   Paroxysmal atrial fibrillation with RVR -2/5--started on amiodarone  drip>>plan to transition to po diltiazem if possible due to elevated LFTs   Chronic ulceration left lower extremity -Does not appear to be infected on examination -secondary to known critical ischemia in the legs -wound care RN consult appreciated -he is not an operative candidate for invasive procedures -treating supportively   Acute on chronic renal failure--CKD stage IIIb -Baseline creatinine 1.4-1.7 -Presented with serum creatinine 2.93 -Secondary to sepsis and hemodynamic changes by depletion -Foley catheter exchanged to 02/01/24   Paroxysmal atrial fibrillation with RVR/acquired thrombophilia -previously not on [facility], but recent notes from SNF shows pt on apixaban   2.5 mg bid -Continue apixaban  -Restart metoprolol  once BP improves   Coronary disease -No chest pain presently   Chronic  constipation -Continue daily MiraLAX    History of penile cancer/neurogenic bladder -Patient has chronic indwelling Foley                   Family Communication:  no Family at bedside   Consultants:  none   Code Status:  DNR   DVT Prophylaxis:  apixaban      Procedures: As Listed in Progress Note Above   Antibiotics: Vanc 2/4 Cefepime  2/4 Ceftriaxone  2/5>>         Subjective: Pt complains of leg pain, unchanged.  Denies f/c, cp, n/v/d, sob  Objective: Vitals:   02/03/24 0100 02/03/24 0200 02/03/24 0300 02/03/24 0400  BP: (!) 99/52 110/68 92/74 105/75  Pulse:   88 94  Resp: 19 20 18 20   Temp:      TempSrc:      SpO2: 97% 99% 100% 99%  Weight:      Height:        Intake/Output Summary (Last 24 hours) at 02/03/2024 0622 Last data filed at 02/03/2024 0423 Gross per 24 hour  Intake 3298.61 ml  Output 675 ml  Net 2623.61 ml   Weight change:  Exam:  General:  Pt is alert, follows commands appropriately, not in acute distress HEENT: No icterus, No thrush, No neck mass, [place]/AT Cardiovascular: IRRR, S1/S2, no rubs, no gallops Respiratory: fine bibasilar crackles.  No wheeze Abdomen: Soft/+BS, non tender, non distended, no guarding Extremities: 2 + LE edema, No lymphangitis, No petechiae, No rashes, no synovitis   Data Reviewed: I have personally reviewed following labs and imaging studies Basic Metabolic Panel: Recent Labs  Lab 01/31/24 0530 02/01/24 1105 02/02/24 0349  NA 136 136 135  K 4.9 4.6 4.3  CL 102 103 109  CO2 20* 20* 20*  GLUCOSE 129* 183* 103*  BUN 38* 56* 49*  CREATININE 1.96* 2.93* 2.00*  CALCIUM  9.0 8.7* 7.6*  MG  --   --  2.2   Liver Function Tests: Recent Labs  Lab 02/01/24 1105 02/02/24 0349  AST 127* 89*  ALT 97* 80*  ALKPHOS 50 44  BILITOT 0.7 0.7  PROT 6.6 5.4*  ALBUMIN  3.0* 2.5*   No results for input(s): LIPASE, AMYLASE in the last 168 hours. No results for input(s): AMMONIA in the last 168  hours. Coagulation Profile: No results for input(s): INR, PROTIME in the last 168 hours. CBC: Recent Labs  Lab 01/31/24 0530 02/01/24 1105 02/02/24 0349  WBC 19.0* 19.3* 13.9*  NEUTROABS 18.4* 17.8* 12.2*  HGB 11.3* 10.6* 8.9*  HCT 35.8* 32.7* 28.4*  MCV 100.6* 101.6* 99.6  PLT 152 128* 117*   Cardiac Enzymes: No results for input(s): CKTOTAL, CKMB, CKMBINDEX, TROPONINI in the last 168 hours. BNP: Invalid input(s): POCBNP CBG: No results for input(s): GLUCAP in the last 168 hours. HbA1C: No results for input(s): HGBA1C in the last 72 hours. Urine analysis:    Component Value Date/Time   COLORURINE YELLOW 02/01/2024 1550   APPEARANCEUR CLOUDY (A) 02/01/2024 1550   APPEARANCEUR Cloudy (A) 08/18/2023 1458   LABSPEC 1.024 02/01/2024 1550   PHURINE 5.0 02/01/2024 1550   GLUCOSEU NEGATIVE 02/01/2024 1550   HGBUR SMALL (A) 02/01/2024 1550   BILIRUBINUR NEGATIVE 02/01/2024 1550   BILIRUBINUR Negative 08/18/2023 1458   KETONESUR NEGATIVE 02/01/2024 1550   PROTEINUR 30 (A) 02/01/2024 1550   UROBILINOGEN 0.2 03/25/2020 1156   NITRITE NEGATIVE  02/01/2024 1550   LEUKOCYTESUR MODERATE (A) 02/01/2024 1550   Sepsis Labs: @LABRCNTIP (procalcitonin:4,lacticidven:4) ) Recent Results (from the past 240 hours)  Resp panel by RT-PCR (RSV, Flu A&B, Covid)     Status: None   Collection Time: 01/30/24  6:42 PM  Result Value Ref Range Status   SARS Coronavirus 2 by RT PCR NEGATIVE NEGATIVE Final    Comment: (NOTE) SARS-CoV-2 target nucleic acids are NOT DETECTED.  The SARS-CoV-2 RNA is generally detectable in upper respiratory specimens during the acute phase of infection. The lowest concentration of SARS-CoV-2 viral copies this assay can detect is 138 copies/mL. A negative result does not preclude SARS-Cov-2 infection and should not be used as the sole basis for treatment or other patient management decisions. A negative result may occur with  improper specimen  collection/handling, submission of specimen other than nasopharyngeal swab, presence of viral mutation(s) within the areas targeted by this assay, and inadequate number of viral copies(<138 copies/mL). A negative result must be combined with clinical observations, patient history, and epidemiological information. The expected result is Negative.  Fact Sheet for Patients:  bloggercourse.com  Fact Sheet for Healthcare Providers:  seriousbroker.it  This test is no t yet approved or cleared by the [place]  FDA and  has been authorized for detection and/or diagnosis of SARS-CoV-2 by FDA under an Emergency Use Authorization (EUA). This EUA will remain  in effect (meaning this test can be used) for the duration of the COVID-19 declaration under Section 564(b)(1) of the Act, 21 U.S.C.section 360bbb-3(b)(1), unless the authorization is terminated  or revoked sooner.       Influenza A by PCR NEGATIVE NEGATIVE Final   Influenza B by PCR NEGATIVE NEGATIVE Final    Comment: (NOTE) The Xpert Xpress SARS-CoV-2/FLU/RSV plus assay is intended as an aid in the diagnosis of influenza from Nasopharyngeal swab specimens and should not be used as a sole basis for treatment. Nasal washings and aspirates are unacceptable for Xpert Xpress SARS-CoV-2/FLU/RSV testing.  Fact Sheet for Patients: bloggercourse.com  Fact Sheet for Healthcare Providers: seriousbroker.it  This test is not yet approved or cleared by the [place]  FDA and has been authorized for detection and/or diagnosis of SARS-CoV-2 by FDA under an Emergency Use Authorization (EUA). This EUA will remain in effect (meaning this test can be used) for the duration of the COVID-19 declaration under Section 564(b)(1) of the Act, 21 U.S.C. section 360bbb-3(b)(1), unless the authorization is terminated or revoked.     Resp Syncytial  Virus by PCR NEGATIVE NEGATIVE Final    Comment: (NOTE) Fact Sheet for Patients: bloggercourse.com  Fact Sheet for Healthcare Providers: seriousbroker.it  This test is not yet approved or cleared by the [place]  FDA and has been authorized for detection and/or diagnosis of SARS-CoV-2 by FDA under an Emergency Use Authorization (EUA). This EUA will remain in effect (meaning this test can be used) for the duration of the COVID-19 declaration under Section 564(b)(1) of the Act, 21 U.S.C. section 360bbb-3(b)(1), unless the authorization is terminated or revoked.  Performed at [facility], [address] [place], [place] [address]   Culture, blood (Routine X 2) w Reflex to ID Panel     Status: Abnormal (Preliminary result)   Collection Time: 01/31/24 10:30 AM   Specimen: BLOOD  Result Value Ref Range Status   Specimen Description   Final    BLOOD RIGHT ARM Performed at [facility], [address] [place], [place] [address]    Special Requests   Final  BOTTLES DRAWN AEROBIC AND ANAEROBIC Blood Culture results may not be optimal due to an inadequate volume of blood received in culture bottles Performed at [facility], [address] [place], [place] [address]    Culture  Setup Time   Final    GRAM POSITIVE COCCI IN BOTH AEROBIC AND ANAEROBIC BOTTLES Gram Stain Report Called to,Read Back By and Verified With: KIM CHILDS @ [facility] [phone], VIRAY,J CRITICAL RESULT CALLED TO, READ BACK BY AND VERIFIED WITH: M TARAWALLY,RN@0637  02/01/24 MK Performed at [facility], [address], [place], [place] [address]    Culture STREPTOCOCCUS AGALACTIAE (A)  Final   Report Status PENDING  Incomplete   Organism ID, Bacteria STREPTOCOCCUS AGALACTIAE  Final      Susceptibility   Streptococcus agalactiae - MIC*    CLINDAMYCIN >=1 RESISTANT Resistant     AMPICILLIN <=0.25 SENSITIVE Sensitive     ERYTHROMYCIN >=8 RESISTANT Resistant      VANCOMYCIN  0.5 SENSITIVE Sensitive     CEFTRIAXONE  <=0.12 SENSITIVE Sensitive     LEVOFLOXACIN 1 SENSITIVE Sensitive     PENICILLIN <=0.06 SENSITIVE Sensitive     * STREPTOCOCCUS AGALACTIAE  Blood Culture ID Panel (Reflexed)     Status: Abnormal   Collection Time: 01/31/24 10:30 AM  Result Value Ref Range Status   Enterococcus faecalis NOT DETECTED NOT DETECTED Final   Enterococcus Faecium NOT DETECTED NOT DETECTED Final   Listeria monocytogenes NOT DETECTED NOT DETECTED Final   Staphylococcus species NOT DETECTED NOT DETECTED Final   Staphylococcus aureus (BCID) NOT DETECTED NOT DETECTED Final   Staphylococcus epidermidis NOT DETECTED NOT DETECTED Final   Staphylococcus lugdunensis NOT DETECTED NOT DETECTED Final   Streptococcus species DETECTED (A) NOT DETECTED Final    Comment: CRITICAL RESULT CALLED TO, READ BACK BY AND VERIFIED WITH: M TARAWALLY,RN@0635  02/01/24 MK    Streptococcus agalactiae DETECTED (A) NOT DETECTED Final    Comment: CRITICAL RESULT CALLED TO, READ BACK BY AND VERIFIED WITH: M TARAWALLY,RN@0635  02/01/24 MK    Streptococcus pneumoniae NOT DETECTED NOT DETECTED Final   Streptococcus pyogenes NOT DETECTED NOT DETECTED Final   A.calcoaceticus-baumannii NOT DETECTED NOT DETECTED Final   Bacteroides fragilis NOT DETECTED NOT DETECTED Final   Enterobacterales NOT DETECTED NOT DETECTED Final   Enterobacter cloacae complex NOT DETECTED NOT DETECTED Final   Escherichia coli NOT DETECTED NOT DETECTED Final   Klebsiella aerogenes NOT DETECTED NOT DETECTED Final   Klebsiella oxytoca NOT DETECTED NOT DETECTED Final   Klebsiella pneumoniae NOT DETECTED NOT DETECTED Final   Proteus species NOT DETECTED NOT DETECTED Final   Salmonella species NOT DETECTED NOT DETECTED Final   Serratia marcescens NOT DETECTED NOT DETECTED Final   Haemophilus influenzae NOT DETECTED NOT DETECTED Final   Neisseria meningitidis NOT DETECTED NOT DETECTED Final   Pseudomonas aeruginosa NOT  DETECTED NOT DETECTED Final   Stenotrophomonas maltophilia NOT DETECTED NOT DETECTED Final   Candida albicans NOT DETECTED NOT DETECTED Final   Candida auris NOT DETECTED NOT DETECTED Final   Candida glabrata NOT DETECTED NOT DETECTED Final   Candida krusei NOT DETECTED NOT DETECTED Final   Candida parapsilosis NOT DETECTED NOT DETECTED Final   Candida tropicalis NOT DETECTED NOT DETECTED Final   Cryptococcus neoformans/gattii NOT DETECTED NOT DETECTED Final    Comment: Performed at [facility], [address] [place], [place] [address]  Culture, blood (Routine X 2) w Reflex to ID Panel     Status: Abnormal (Preliminary result)   Collection  Time: 01/31/24 10:50 AM   Specimen: BLOOD  Result Value Ref Range Status   Specimen Description   Final    BLOOD RIGHT HAND Performed at [facility], [address] [place], [place] [address]    Special Requests   Final    BOTTLES DRAWN AEROBIC AND ANAEROBIC Blood Culture results may not be optimal due to an inadequate volume of blood received in culture bottles Performed at [facility], [address] [place], [place] [address]    Culture  Setup Time   Final    GRAM POSITIVE COCCI IN BOTH AEROBIC AND ANAEROBIC BOTTLES PREV CALLED:  K. CHILDS [phone], VIRAY,J Performed at [facility], [address] [place], [place] [address]    Culture (A)  Final    GROUP B STREP(S.AGALACTIAE)ISOLATED SUSCEPTIBILITIES PERFORMED ON PREVIOUS CULTURE WITHIN THE LAST 5 DAYS. Performed at [facility], [address], [place], [place] [address]    Report Status PENDING  Incomplete  Blood Culture (routine x 2)     Status: None (Preliminary result)   Collection Time: 02/01/24 10:56 AM   Specimen: BLOOD  Result Value Ref Range Status   Specimen Description BLOOD RIGHT ARM  Final   Special Requests   Final    BOTTLES DRAWN AEROBIC ONLY Blood Culture adequate volume   Culture   Final    NO GROWTH < 24 HOURS Performed at [facility],  [address], [place], [place] [address]    Report Status PENDING  Incomplete  Resp panel by RT-PCR (RSV, Flu A&B, Covid) Anterior Nasal Swab     Status: None   Collection Time: 02/01/24 11:00 AM   Specimen: Anterior Nasal Swab  Result Value Ref Range Status   SARS Coronavirus 2 by RT PCR NEGATIVE NEGATIVE Final    Comment: (NOTE) SARS-CoV-2 target nucleic acids are NOT DETECTED.  The SARS-CoV-2 RNA is generally detectable in upper respiratory specimens during the acute phase of infection. The lowest concentration of SARS-CoV-2 viral copies this assay can detect is 138 copies/mL. A negative result does not preclude SARS-Cov-2 infection and should not be used as the sole basis for treatment or other patient management decisions. A negative result may occur with  improper specimen collection/handling, submission of specimen other than nasopharyngeal swab, presence of viral mutation(s) within the areas targeted by this assay, and inadequate number of viral copies(<138 copies/mL). A negative result must be combined with clinical observations, patient history, and epidemiological information. The expected result is Negative.  Fact Sheet for Patients:  bloggercourse.com  Fact Sheet for Healthcare Providers:  seriousbroker.it  This test is no t yet approved or cleared by the [place]  FDA and  has been authorized for detection and/or diagnosis of SARS-CoV-2 by FDA under an Emergency Use Authorization (EUA). This EUA will remain  in effect (meaning this test can be used) for the duration of the COVID-19 declaration under Section 564(b)(1) of the Act, 21 U.S.C.section 360bbb-3(b)(1), unless the authorization is terminated  or revoked sooner.       Influenza A by PCR NEGATIVE NEGATIVE Final   Influenza B by PCR NEGATIVE NEGATIVE Final    Comment: (NOTE) The Xpert Xpress SARS-CoV-2/FLU/RSV plus assay is intended as an aid in the  diagnosis of influenza from Nasopharyngeal swab specimens and should not be used as a sole basis for treatment. Nasal washings and aspirates are unacceptable for Xpert Xpress SARS-CoV-2/FLU/RSV testing.  Fact Sheet for Patients: bloggercourse.com  Fact Sheet for Healthcare Providers: seriousbroker.it  This test is not yet approved or cleared by the [place]  FDA and has been authorized for detection and/or diagnosis of SARS-CoV-2 by FDA under an Emergency Use Authorization (EUA). This EUA will remain in effect (meaning this test can be used) for the duration of the COVID-19 declaration under Section 564(b)(1) of the Act, 21 U.S.C. section 360bbb-3(b)(1), unless the authorization is terminated or revoked.     Resp Syncytial Virus by PCR NEGATIVE NEGATIVE Final    Comment: (NOTE) Fact Sheet for Patients: bloggercourse.com  Fact Sheet for Healthcare Providers: seriousbroker.it  This test is not yet approved or cleared by the [place]  FDA and has been authorized for detection and/or diagnosis of SARS-CoV-2 by FDA under an Emergency Use Authorization (EUA). This EUA will remain in effect (meaning this test can be used) for the duration of the COVID-19 declaration under Section 564(b)(1) of the Act, 21 U.S.C. section 360bbb-3(b)(1), unless the authorization is terminated or revoked.  Performed at [facility], [address] [place], [place] [address]   Blood Culture (routine x 2)     Status: None (Preliminary result)   Collection Time: 02/01/24 11:05 AM   Specimen: BLOOD  Result Value Ref Range Status   Specimen Description BLOOD BLOOD LEFT ARM wrist  Final   Special Requests   Final    BOTTLES DRAWN AEROBIC AND ANAEROBIC Blood Culture adequate volume   Culture   Final    NO GROWTH < 24 HOURS Performed at [facility], [address] [place], [place] [address]     Report Status PENDING  Incomplete  MRSA Next Gen by PCR, Nasal     Status: None   Collection Time: 02/01/24  2:01 PM   Specimen: Nasal Mucosa; Nasal Swab  Result Value Ref Range Status   MRSA by PCR Next Gen NOT DETECTED NOT DETECTED Final    Comment: (NOTE) The GeneXpert MRSA Assay (FDA approved for NASAL specimens only), is one component of a comprehensive MRSA colonization surveillance program. It is not intended to diagnose MRSA infection nor to guide or monitor treatment for MRSA infections. Test performance is not FDA approved in patients less than 64 years old. Performed at [facility], [address], [place], [place] [address]   Cath Tip Culture     Status: None (Preliminary result)   Collection Time: 02/01/24  3:40 PM   Specimen: Catheter Tip; Other  Result Value Ref Range Status   Specimen Description   Final    CATH TIP Performed at [facility], [address], [place], [place] [address]    Special Requests   Final    NONE Performed at [facility], [address], [place], [place] [address]    Culture   Final    NO GROWTH < 12 HOURS Performed at [facility], [address] [place], [place] [address]    Report Status PENDING  Incomplete  Urine Culture     Status: Abnormal   Collection Time: 02/01/24  3:50 PM   Specimen: Urine, Random  Result Value Ref Range Status   Specimen Description   Final    URINE, RANDOM Performed at [facility], [address] [place], [place] [address]    Special Requests   Final    NONE Reflexed from [id number] Performed at [facility], [address] [place], [place] [address]    Culture (A)  Final    <10,000 COLONIES/mL INSIGNIFICANT GROWTH Performed at [facility], [address]  [place], [place] [address]    Report Status 02/02/2024 FINAL  Final     Scheduled Meds:  apixaban   2.5 mg Oral BID   Chlorhexidine  Gluconate Cloth  6 each Topical Q0600   hydrocerin   Topical BID   midodrine   10 mg Oral TID WC    pantoprazole  (PROTONIX ) IV  40 mg Intravenous Q24H   Continuous Infusions:  amiodarone  30 mg/hr (02/03/24 0423)   cefTRIAXone  (ROCEPHIN )  IV Stopped (02/02/24 1303)   norepinephrine  (LEVOPHED ) Adult infusion      Procedures/Studies: DG Chest Port 1 View Result Date: 02/01/2024 CLINICAL DATA:  Questionable sepsis - evaluate for abnormality. Hypotension and tachycardia. EXAM: PORTABLE CHEST 1 VIEW COMPARISON:  08/29/2023. FINDINGS: Low lung volume. There are nonspecific opacities throughout bilateral lungs, grossly similar to the prior study favored to represent underlying fibrosis/scarring. No frank pulmonary edema. No acute dense consolidation or lung collapse. Bilateral costophrenic angles are clear. Note is made of elevated left hemidiaphragm. Stable cardio-mediastinal silhouette. No acute osseous abnormalities. The soft tissues are within normal limits. IMPRESSION: No active disease. Electronically Signed   By: Ree Molt M.D.   On: 02/01/2024 11:54    Alm Schneider, DO  [facility]  If 7PM-7AM, please contact night-coverage [email or web address] Password [email or web address] 02/03/2024, 6:22 AM   LOS: 2 days

## 2024-02-03 NOTE — TOC Progression Note (Signed)
 Transition of Care [facility]) - Progression Note    Patient Details  Name: Caleb [name] MRN: [id number] Date of Birth: 04-May-1925  [facility] CM/SW Contact  Rollo Petri, LCSW Phone Number: 02/03/2024, 10:32 AM  Clinical Narrative:     TOC following.  Pt improving per MD and may even be stable for dc back to [facility] tomorrow.  Updated Tammy at [facility]. Will follow up in AM.  Expected Discharge Plan: Skilled Nursing Facility Barriers to Discharge: Continued Medical Work up  Expected Discharge Plan and Services In-house Referral: Clinical Social Work   Post Acute Care Choice: Skilled Nursing Facility Living arrangements for the past 2 months: Skilled Nursing Facility                                       Social Determinants of Health (SDOH) Interventions SDOH Screenings   Food Insecurity: No Food Insecurity (02/01/2024)  Housing: Unknown (02/01/2024)  Transportation Needs: No Transportation Needs (02/01/2024)  Utilities: Not At Risk (02/01/2024)  Depression (PHQ2-9): Low Risk  (01/27/2024)  Social Connections: Unknown (02/01/2024)  Tobacco Use: Medium Risk (02/01/2024)    Readmission Risk Interventions    02/02/2024   10:19 AM 07/15/2021    1:30 PM 07/14/2021   10:40 AM  Readmission Risk Prevention Plan  Post Dischage Appt  Complete   Medication Screening   Complete  Transportation Screening Complete  Complete  HRI or Home Care Consult Complete    Social Work Consult for Recovery Care Planning/Counseling Complete    Medication Review Oceanographer) Complete

## 2024-02-03 NOTE — Plan of Care (Signed)
   Problem: Education: Goal: Knowledge of General Education information will improve Description: Including pain rating scale, medication(s)/side effects and non-pharmacologic comfort measures Outcome: Not Progressing   Problem: Health Behavior/Discharge Planning: Goal: Ability to manage health-related needs will improve Outcome: Not Progressing   Problem: Clinical Measurements: Goal: Ability to maintain clinical measurements within normal limits will improve Outcome: Progressing

## 2024-02-03 NOTE — Plan of Care (Signed)
  Problem: Education: Goal: Knowledge of General Education information will improve Description: Including pain rating scale, medication(s)/side effects and non-pharmacologic comfort measures Outcome: Progressing   Problem: Health Behavior/Discharge Planning: Goal: Ability to manage health-related needs will improve Outcome: Progressing   Problem: Clinical Measurements: Goal: Ability to maintain clinical measurements within normal limits will improve Outcome: Progressing Goal: Will remain free from infection Outcome: Progressing Goal: Diagnostic test results will improve Outcome: Progressing Goal: Respiratory complications will improve Outcome: Progressing Goal: Cardiovascular complication will be avoided Outcome: Progressing   Problem: Activity: Goal: Risk for activity intolerance will decrease Outcome: Not Progressing   Problem: Coping: Goal: Level of anxiety will decrease Outcome: Progressing   Problem: Elimination: Goal: Will not experience complications related to bowel motility Outcome: Progressing Goal: Will not experience complications related to urinary retention Outcome: Progressing   Problem: Pain Managment: Goal: General experience of comfort will improve and/or be controlled Outcome: Progressing   Problem: Safety: Goal: Ability to remain free from injury will improve Outcome: Progressing   Problem: Skin Integrity: Goal: Risk for impaired skin integrity will decrease Outcome: Progressing

## 2024-02-04 ENCOUNTER — Other Ambulatory Visit (HOSPITAL_COMMUNITY): Payer: Self-pay | Admitting: *Deleted

## 2024-02-04 ENCOUNTER — Inpatient Hospital Stay (HOSPITAL_COMMUNITY): Payer: Medicare Other

## 2024-02-04 DIAGNOSIS — D6869 Other thrombophilia: Secondary | ICD-10-CM | POA: Diagnosis not present

## 2024-02-04 DIAGNOSIS — A419 Sepsis, unspecified organism: Secondary | ICD-10-CM | POA: Diagnosis not present

## 2024-02-04 DIAGNOSIS — R7881 Bacteremia: Secondary | ICD-10-CM

## 2024-02-04 DIAGNOSIS — I959 Hypotension, unspecified: Secondary | ICD-10-CM | POA: Diagnosis not present

## 2024-02-04 DIAGNOSIS — R6520 Severe sepsis without septic shock: Secondary | ICD-10-CM | POA: Diagnosis not present

## 2024-02-04 DIAGNOSIS — I4891 Unspecified atrial fibrillation: Secondary | ICD-10-CM | POA: Diagnosis not present

## 2024-02-04 DIAGNOSIS — R600 Localized edema: Secondary | ICD-10-CM | POA: Diagnosis not present

## 2024-02-04 LAB — COMPREHENSIVE METABOLIC PANEL
ALT: 101 U/L — ABNORMAL HIGH (ref 0–44)
AST: 79 U/L — ABNORMAL HIGH (ref 15–41)
Albumin: 2.4 g/dL — ABNORMAL LOW (ref 3.5–5.0)
Alkaline Phosphatase: 155 U/L — ABNORMAL HIGH (ref 38–126)
Anion gap: 6 (ref 5–15)
BUN: 43 mg/dL — ABNORMAL HIGH (ref 8–23)
CO2: 21 mmol/L — ABNORMAL LOW (ref 22–32)
Calcium: 8.1 mg/dL — ABNORMAL LOW (ref 8.9–10.3)
Chloride: 107 mmol/L (ref 98–111)
Creatinine, Ser: 1.5 mg/dL — ABNORMAL HIGH (ref 0.61–1.24)
GFR, Estimated: 42 mL/min — ABNORMAL LOW (ref >60)
Glucose, Bld: 97 mg/dL (ref 70–99)
Potassium: 4.1 mmol/L (ref 3.5–5.1)
Sodium: 134 mmol/L — ABNORMAL LOW (ref 135–145)
Total Bilirubin: 0.6 mg/dL (ref 0.0–1.2)
Total Protein: 5.6 g/dL — ABNORMAL LOW (ref 6.5–8.1)

## 2024-02-04 LAB — CBC WITH DIFFERENTIAL/PLATELET
Abs Immature Granulocytes: 0.08 10*3/uL — ABNORMAL HIGH (ref 0.00–0.07)
Basophils Absolute: 0 10*3/uL (ref 0.0–0.1)
Basophils Relative: 0 %
Eosinophils Absolute: 0.1 10*3/uL (ref 0.0–0.5)
Eosinophils Relative: 1 %
HCT: 30.6 % — ABNORMAL LOW (ref 39.0–52.0)
Hemoglobin: 9.9 g/dL — ABNORMAL LOW (ref 13.0–17.0)
Immature Granulocytes: 1 %
Lymphocytes Relative: 11 %
Lymphs Abs: 1 10*3/uL (ref 0.7–4.0)
MCH: 31.8 pg (ref 26.0–34.0)
MCHC: 32.4 g/dL (ref 30.0–36.0)
MCV: 98.4 fL (ref 80.0–100.0)
Monocytes Absolute: 0.8 10*3/uL (ref 0.1–1.0)
Monocytes Relative: 9 %
Neutro Abs: 7.1 10*3/uL (ref 1.7–7.7)
Neutrophils Relative %: 78 %
Platelets: 153 10*3/uL (ref 150–400)
RBC: 3.11 MIL/uL — ABNORMAL LOW (ref 4.22–5.81)
RDW: 16.6 % — ABNORMAL HIGH (ref 11.5–15.5)
WBC: 9.1 10*3/uL (ref 4.0–10.5)
nRBC: 0 % (ref 0.0–0.2)

## 2024-02-04 LAB — ECHOCARDIOGRAM COMPLETE
AR max vel: 1.52 cm2
AV Area VTI: 1.28 cm2
AV Area mean vel: 1.43 cm2
AV Mean grad: 15 mm[Hg]
AV Peak grad: 24.8 mm[Hg]
Ao pk vel: 2.49 m/s
Area-P 1/2: 3.77 cm2
Height: 70 in
S' Lateral: 2.3 cm
Weight: 4268.11 [oz_av]

## 2024-02-04 LAB — CATH TIP CULTURE: Culture: NO GROWTH

## 2024-02-04 MED ORDER — METOPROLOL TARTRATE 25 MG PO TABS
25.0000 mg | ORAL_TABLET | Freq: Two times a day (BID) | ORAL | Status: DC
  Administered 2024-02-04: 25 mg via ORAL
  Filled 2024-02-04: qty 1

## 2024-02-04 MED ORDER — MIDODRINE HCL 5 MG PO TABS
5.0000 mg | ORAL_TABLET | Freq: Three times a day (TID) | ORAL | Status: DC
  Administered 2024-02-04 (×2): 5 mg via ORAL
  Filled 2024-02-04 (×2): qty 1

## 2024-02-04 NOTE — Progress Notes (Addendum)
 PROGRESS NOTE  Caleb [name] FMW:[id number] DOB: Oct 06, 1925 DOA: 02/01/2024 PCP: Tish Elsie FALCON, MD  Brief History:  89 year old gentleman longtime resident of the [facility] nursing center with history of vascular dementia, ADH, coronary artery disease status post CABG, peripheral vascular disease with critical ischemia in the lower extremities no longer an operative candidate, hyperlipidemia, history of penile cancer status post partial penectomy with chronic indwelling Foley catheter, chronic dependent edema in the legs, GERD, hypertension, DNR, physical debility and frequent falls, paroxysmal A-fib not anticoagulated due to history of GI bleeding 2022, stage IIIb CKD, neurogenic bladder apparently according to reports from family he has been dealing with an ulceration involving the left leg for about 5 weeks.  It started as a blister and subsequently has gotten larger and larger and now has become infected.  The lesion thought to be secondary to poor circulation as a result of the critical limb ischemia that he is dealing with. Patient is No longer ambulatory and has to be fed by staff members.  He was noted to have a low-grade fever and upper respiratory symptoms over the past several days and was seen by the practitioner at the [facility] on 01/30/2023 and noted to have a WBC of 19.3.  He was noted to have a red hot right lower extremity concerning for cellulitis.  He was started on Zosyn om 01/31/24  He was subsequently sent to ED by EMS due to hypotension and tachycardia with concern for sepsis.  He arrived with low blood pressure and tachycardic heart rate with an elevated lactic acid of 4.1.  Code sepsis called and he was hydrated with IV fluids and started on broad-spectrum antibiotic therapy after blood cultures.  GOC Discussion with his son at time of admission by ED physician and admitting physician and he was agreeable to a trial of IV fluid and IV antibiotics but did not want to escalate  care beyond that.  If he did not improve would want the focus of patient's care to be primarily on comfort and dignity.  He requested pain and anxiety medications now.  He was agreeable to revisit patient's progress in 24 hours to decide if transition to full comfort measures desired.   Assessment/Plan: Severe sepsis -Secondary to Bacteremia and cellulitis of the leg -Presented with leukocytosis and tachycardia with lactic acid of 4.1 -UA 21-50 WBC -Follow urine culture <10K colonies -Personally reviewed chest x-ray--no infiltrates or edema -01/31/2024 blood culture--group B streptococcus -Initially started on vancomycin , cefepime , metronidazole  -Discontinue vancomycin  metronidazole  and cefepime  -continue ceftriaxone    Pyuria -Patient has chronic indwelling Foley catheter -culture <10K   Group B streptococcus bacteremia -Source is likely leg infection vs PICC -Patient had a PICC line prior to admission--removed 2/4 -Follow repeat blood cultures--neg to date -continue ceftriaxone    Paroxysmal atrial fibrillation with RVR -2/5--started on amiodarone  drip>>plan to transition to po diltiazem if possible due to elevated LFTs -continue apixaban --discussed with family--ok to to continue -Restart metoprolol  once BP improves  Chronic ulceration left lower extremity -Does not appear to be infected on examination -secondary to known critical ischemia in the legs -wound care RN consult appreciated -he is not an operative candidate for invasive procedures -treating supportively   Acute on chronic renal failure--CKD stage IIIb -Baseline creatinine 1.4-1.7 -Presented with serum creatinine 2.93 -Secondary to sepsis and hemodynamic changes by depletion -Foley catheter exchanged to 02/01/24 -improved with IVF and treatment of infection   Coronary disease -No chest  pain presently   Chronic constipation -Continue daily MiraLAX    History of penile cancer/neurogenic bladder -Patient has  chronic indwelling Foley                   Family Communication:  brother updated 2/7   Consultants:  cardiology   Code Status:  DNR   DVT Prophylaxis:  apixaban      Procedures: As Listed in Progress Note Above   Antibiotics: Vanc 2/4 Cefepime  2/4 Ceftriaxone  2/5>>              Subjective: Patient denies fevers, chills, headache, chest pain, dyspnea, nausea, vomiting, diarrhea, abdominal pain, dysuria, hematuria, hematochezia, and melena.   Objective: Vitals:   02/04/24 0600 02/04/24 0800 02/04/24 0900 02/04/24 1000  BP: 116/67 (!) 129/93 (!) 152/87 (!) 130/96  Pulse: 88 77 95 (!) 40  Resp: 17 17 16 19   Temp:      TempSrc:      SpO2: 99% 98% 99% 99%  Weight: 121 kg     Height:        Intake/Output Summary (Last 24 hours) at 02/04/2024 1102 Last data filed at 02/04/2024 0600 Gross per 24 hour  Intake 509.94 ml  Output 1500 ml  Net -990.06 ml   Weight change:  Exam:  General:  Pt is alert, follows commands appropriately, not in acute distress HEENT: No icterus, No thrush, No neck mass, [place]/AT Cardiovascular: IRRR, S1/S2, no rubs, no gallops Respiratory: bibasilar crackles.  No wheeze Abdomen: Soft/+BS, non tender, non distended, no guarding Extremities: 2 + LE edema, No lymphangitis, No petechiae, No rashes, no synovitis   Data Reviewed: I have personally reviewed following labs and imaging studies Basic Metabolic Panel: Recent Labs  Lab 01/31/24 0530 02/01/24 1105 02/02/24 0349 02/03/24 0643 02/04/24 0409  NA 136 136 135 135 134*  K 4.9 4.6 4.3 4.2 4.1  CL 102 103 109 107 107  CO2 20* 20* 20* 21* 21*  GLUCOSE 129* 183* 103* 103* 97  BUN 38* 56* 49* 45* 43*  CREATININE 1.96* 2.93* 2.00* 1.62* 1.50*  CALCIUM  9.0 8.7* 7.6* 8.0* 8.1*  MG  --   --  2.2 2.3  --    Liver Function Tests: Recent Labs  Lab 02/01/24 1105 02/02/24 0349 02/03/24 0643 02/04/24 0409  AST 127* 89* 114* 79*  ALT 97* 80* 109* 101*  ALKPHOS 50 44 118 155*   BILITOT 0.7 0.7 0.6 0.6  PROT 6.6 5.4* 5.5* 5.6*  ALBUMIN  3.0* 2.5* 2.5* 2.4*   No results for input(s): LIPASE, AMYLASE in the last 168 hours. No results for input(s): AMMONIA in the last 168 hours. Coagulation Profile: No results for input(s): INR, PROTIME in the last 168 hours. CBC: Recent Labs  Lab 01/31/24 0530 02/01/24 1105 02/02/24 0349 02/03/24 0643 02/04/24 0409  WBC 19.0* 19.3* 13.9* 11.8* 9.1  NEUTROABS 18.4* 17.8* 12.2* 9.9* 7.1  HGB 11.3* 10.6* 8.9* 9.8* 9.9*  HCT 35.8* 32.7* 28.4* 30.2* 30.6*  MCV 100.6* 101.6* 99.6 101.0* 98.4  PLT 152 128* 117* 136* 153   Cardiac Enzymes: No results for input(s): CKTOTAL, CKMB, CKMBINDEX, TROPONINI in the last 168 hours. BNP: Invalid input(s): POCBNP CBG: No results for input(s): GLUCAP in the last 168 hours. HbA1C: No results for input(s): HGBA1C in the last 72 hours. Urine analysis:    Component Value Date/Time   COLORURINE YELLOW 02/01/2024 1550   APPEARANCEUR CLOUDY (A) 02/01/2024 1550   APPEARANCEUR Cloudy (A) 08/18/2023 1458   LABSPEC 1.024 02/01/2024 1550  PHURINE 5.0 02/01/2024 1550   GLUCOSEU NEGATIVE 02/01/2024 1550   HGBUR SMALL (A) 02/01/2024 1550   BILIRUBINUR NEGATIVE 02/01/2024 1550   BILIRUBINUR Negative 08/18/2023 1458   KETONESUR NEGATIVE 02/01/2024 1550   PROTEINUR 30 (A) 02/01/2024 1550   UROBILINOGEN 0.2 03/25/2020 1156   NITRITE NEGATIVE 02/01/2024 1550   LEUKOCYTESUR MODERATE (A) 02/01/2024 1550   Sepsis Labs: @LABRCNTIP (procalcitonin:4,lacticidven:4) ) Recent Results (from the past 240 hours)  Resp panel by RT-PCR (RSV, Flu A&B, Covid)     Status: None   Collection Time: 01/30/24  6:42 PM  Result Value Ref Range Status   SARS Coronavirus 2 by RT PCR NEGATIVE NEGATIVE Final    Comment: (NOTE) SARS-CoV-2 target nucleic acids are NOT DETECTED.  The SARS-CoV-2 RNA is generally detectable in upper respiratory specimens during the acute phase of infection. The  lowest concentration of SARS-CoV-2 viral copies this assay can detect is 138 copies/mL. A negative result does not preclude SARS-Cov-2 infection and should not be used as the sole basis for treatment or other patient management decisions. A negative result may occur with  improper specimen collection/handling, submission of specimen other than nasopharyngeal swab, presence of viral mutation(s) within the areas targeted by this assay, and inadequate number of viral copies(<138 copies/mL). A negative result must be combined with clinical observations, patient history, and epidemiological information. The expected result is Negative.  Fact Sheet for Patients:  bloggercourse.com  Fact Sheet for Healthcare Providers:  seriousbroker.it  This test is no t yet approved or cleared by the [place]  FDA and  has been authorized for detection and/or diagnosis of SARS-CoV-2 by FDA under an Emergency Use Authorization (EUA). This EUA will remain  in effect (meaning this test can be used) for the duration of the COVID-19 declaration under Section 564(b)(1) of the Act, 21 U.S.C.section 360bbb-3(b)(1), unless the authorization is terminated  or revoked sooner.       Influenza A by PCR NEGATIVE NEGATIVE Final   Influenza B by PCR NEGATIVE NEGATIVE Final    Comment: (NOTE) The Xpert Xpress SARS-CoV-2/FLU/RSV plus assay is intended as an aid in the diagnosis of influenza from Nasopharyngeal swab specimens and should not be used as a sole basis for treatment. Nasal washings and aspirates are unacceptable for Xpert Xpress SARS-CoV-2/FLU/RSV testing.  Fact Sheet for Patients: bloggercourse.com  Fact Sheet for Healthcare Providers: seriousbroker.it  This test is not yet approved or cleared by the [place]  FDA and has been authorized for detection and/or diagnosis of SARS-CoV-2 by FDA under  an Emergency Use Authorization (EUA). This EUA will remain in effect (meaning this test can be used) for the duration of the COVID-19 declaration under Section 564(b)(1) of the Act, 21 U.S.C. section 360bbb-3(b)(1), unless the authorization is terminated or revoked.     Resp Syncytial Virus by PCR NEGATIVE NEGATIVE Final    Comment: (NOTE) Fact Sheet for Patients: bloggercourse.com  Fact Sheet for Healthcare Providers: seriousbroker.it  This test is not yet approved or cleared by the [place]  FDA and has been authorized for detection and/or diagnosis of SARS-CoV-2 by FDA under an Emergency Use Authorization (EUA). This EUA will remain in effect (meaning this test can be used) for the duration of the COVID-19 declaration under Section 564(b)(1) of the Act, 21 U.S.C. section 360bbb-3(b)(1), unless the authorization is terminated or revoked.  Performed at [facility], [address], [place], [place] [address]   Culture, blood (Routine X 2) w Reflex to ID Panel     Status:  Abnormal   Collection Time: 01/31/24 10:30 AM   Specimen: BLOOD  Result Value Ref Range Status   Specimen Description   Final    BLOOD RIGHT ARM Performed at [facility], [address] [place], [place] [address]    Special Requests   Final    BOTTLES DRAWN AEROBIC AND ANAEROBIC Blood Culture results may not be optimal due to an inadequate volume of blood received in culture bottles Performed at [facility], [address], [place], [place] [address]    Culture  Setup Time   Final    GRAM POSITIVE COCCI IN BOTH AEROBIC AND ANAEROBIC BOTTLES Gram Stain Report Called to,Read Back By and Verified With: KIM CHILDS @ [facility] [phone], VIRAY,J CRITICAL RESULT CALLED TO, READ BACK BY AND VERIFIED WITH: M TARAWALLY,RN@0637  02/01/24 MK Performed at [facility], [address], [place], [place] [address]    Culture GROUP B STREP(S.AGALACTIAE)ISOLATED (A)   Final   Report Status 02/03/2024 FINAL  Final   Organism ID, Bacteria GROUP B STREP(S.AGALACTIAE)ISOLATED  Final      Susceptibility   Group b strep(s.agalactiae)isolated - MIC*    CLINDAMYCIN >=1 RESISTANT Resistant     AMPICILLIN <=0.25 SENSITIVE Sensitive     ERYTHROMYCIN >=8 RESISTANT Resistant     VANCOMYCIN  0.5 SENSITIVE Sensitive     CEFTRIAXONE  <=0.12 SENSITIVE Sensitive     LEVOFLOXACIN 1 SENSITIVE Sensitive     PENICILLIN <=0.06 SENSITIVE Sensitive     * GROUP B STREP(S.AGALACTIAE)ISOLATED  Blood Culture ID Panel (Reflexed)     Status: Abnormal   Collection Time: 01/31/24 10:30 AM  Result Value Ref Range Status   Enterococcus faecalis NOT DETECTED NOT DETECTED Final   Enterococcus Faecium NOT DETECTED NOT DETECTED Final   Listeria monocytogenes NOT DETECTED NOT DETECTED Final   Staphylococcus species NOT DETECTED NOT DETECTED Final   Staphylococcus aureus (BCID) NOT DETECTED NOT DETECTED Final   Staphylococcus epidermidis NOT DETECTED NOT DETECTED Final   Staphylococcus lugdunensis NOT DETECTED NOT DETECTED Final   Streptococcus species DETECTED (A) NOT DETECTED Final    Comment: CRITICAL RESULT CALLED TO, READ BACK BY AND VERIFIED WITH: M TARAWALLY,RN@0635  02/01/24 MK    Streptococcus agalactiae DETECTED (A) NOT DETECTED Final    Comment: CRITICAL RESULT CALLED TO, READ BACK BY AND VERIFIED WITH: M TARAWALLY,RN@0635  02/01/24 MK    Streptococcus pneumoniae NOT DETECTED NOT DETECTED Final   Streptococcus pyogenes NOT DETECTED NOT DETECTED Final   A.calcoaceticus-baumannii NOT DETECTED NOT DETECTED Final   Bacteroides fragilis NOT DETECTED NOT DETECTED Final   Enterobacterales NOT DETECTED NOT DETECTED Final   Enterobacter cloacae complex NOT DETECTED NOT DETECTED Final   Escherichia coli NOT DETECTED NOT DETECTED Final   Klebsiella aerogenes NOT DETECTED NOT DETECTED Final   Klebsiella oxytoca NOT DETECTED NOT DETECTED Final   Klebsiella pneumoniae NOT DETECTED NOT  DETECTED Final   Proteus species NOT DETECTED NOT DETECTED Final   Salmonella species NOT DETECTED NOT DETECTED Final   Serratia marcescens NOT DETECTED NOT DETECTED Final   Haemophilus influenzae NOT DETECTED NOT DETECTED Final   Neisseria meningitidis NOT DETECTED NOT DETECTED Final   Pseudomonas aeruginosa NOT DETECTED NOT DETECTED Final   Stenotrophomonas maltophilia NOT DETECTED NOT DETECTED Final   Candida albicans NOT DETECTED NOT DETECTED Final   Candida auris NOT DETECTED NOT DETECTED Final   Candida glabrata NOT DETECTED NOT DETECTED Final   Candida krusei NOT DETECTED NOT DETECTED Final   Candida parapsilosis NOT DETECTED NOT DETECTED  Final   Candida tropicalis NOT DETECTED NOT DETECTED Final   Cryptococcus neoformans/gattii NOT DETECTED NOT DETECTED Final    Comment: Performed at [facility], [address], [place], [place] [address]  Culture, blood (Routine X 2) w Reflex to ID Panel     Status: Abnormal   Collection Time: 01/31/24 10:50 AM   Specimen: BLOOD  Result Value Ref Range Status   Specimen Description   Final    BLOOD RIGHT HAND Performed at [facility], [address], [place], [place] [address]    Special Requests   Final    BOTTLES DRAWN AEROBIC AND ANAEROBIC Blood Culture results may not be optimal due to an inadequate volume of blood received in culture bottles Performed at [facility], [address], [place], [place] [address]    Culture  Setup Time   Final    GRAM POSITIVE COCCI IN BOTH AEROBIC AND ANAEROBIC BOTTLES PREV CALLED:  K. CHILDS [phone], VIRAY,J Performed at [facility], [address] [place], [place] [address]    Culture (A)  Final    GROUP B STREP(S.AGALACTIAE)ISOLATED SUSCEPTIBILITIES PERFORMED ON PREVIOUS CULTURE WITHIN THE LAST 5 DAYS. Performed at [facility], [address] [place], [place] [address]    Report Status 02/03/2024 FINAL  Final  Blood Culture (routine x 2)     Status: None (Preliminary result)    Collection Time: 02/01/24 10:56 AM   Specimen: BLOOD  Result Value Ref Range Status   Specimen Description BLOOD RIGHT ARM  Final   Special Requests   Final    BOTTLES DRAWN AEROBIC ONLY Blood Culture adequate volume   Culture   Final    NO GROWTH 3 DAYS Performed at [facility], [address] [place], [place] [address]    Report Status PENDING  Incomplete  Resp panel by RT-PCR (RSV, Flu A&B, Covid) Anterior Nasal Swab     Status: None   Collection Time: 02/01/24 11:00 AM   Specimen: Anterior Nasal Swab  Result Value Ref Range Status   SARS Coronavirus 2 by RT PCR NEGATIVE NEGATIVE Final    Comment: (NOTE) SARS-CoV-2 target nucleic acids are NOT DETECTED.  The SARS-CoV-2 RNA is generally detectable in upper respiratory specimens during the acute phase of infection. The lowest concentration of SARS-CoV-2 viral copies this assay can detect is 138 copies/mL. A negative result does not preclude SARS-Cov-2 infection and should not be used as the sole basis for treatment or other patient management decisions. A negative result may occur with  improper specimen collection/handling, submission of specimen other than nasopharyngeal swab, presence of viral mutation(s) within the areas targeted by this assay, and inadequate number of viral copies(<138 copies/mL). A negative result must be combined with clinical observations, patient history, and epidemiological information. The expected result is Negative.  Fact Sheet for Patients:  bloggercourse.com  Fact Sheet for Healthcare Providers:  seriousbroker.it  This test is no t yet approved or cleared by the [place]  FDA and  has been authorized for detection and/or diagnosis of SARS-CoV-2 by FDA under an Emergency Use Authorization (EUA). This EUA will remain  in effect (meaning this test can be used) for the duration of the COVID-19 declaration under Section 564(b)(1) of the Act,  21 U.S.C.section 360bbb-3(b)(1), unless the authorization is terminated  or revoked sooner.       Influenza A by PCR NEGATIVE NEGATIVE Final   Influenza B by PCR NEGATIVE NEGATIVE Final    Comment: (NOTE)  The Xpert Xpress SARS-CoV-2/FLU/RSV plus assay is intended as an aid in the diagnosis of influenza from Nasopharyngeal swab specimens and should not be used as a sole basis for treatment. Nasal washings and aspirates are unacceptable for Xpert Xpress SARS-CoV-2/FLU/RSV testing.  Fact Sheet for Patients: bloggercourse.com  Fact Sheet for Healthcare Providers: seriousbroker.it  This test is not yet approved or cleared by the [place]  FDA and has been authorized for detection and/or diagnosis of SARS-CoV-2 by FDA under an Emergency Use Authorization (EUA). This EUA will remain in effect (meaning this test can be used) for the duration of the COVID-19 declaration under Section 564(b)(1) of the Act, 21 U.S.C. section 360bbb-3(b)(1), unless the authorization is terminated or revoked.     Resp Syncytial Virus by PCR NEGATIVE NEGATIVE Final    Comment: (NOTE) Fact Sheet for Patients: bloggercourse.com  Fact Sheet for Healthcare Providers: seriousbroker.it  This test is not yet approved or cleared by the [place]  FDA and has been authorized for detection and/or diagnosis of SARS-CoV-2 by FDA under an Emergency Use Authorization (EUA). This EUA will remain in effect (meaning this test can be used) for the duration of the COVID-19 declaration under Section 564(b)(1) of the Act, 21 U.S.C. section 360bbb-3(b)(1), unless the authorization is terminated or revoked.  Performed at [facility], [address], [place], [place] [address]   Blood Culture (routine x 2)     Status: None (Preliminary result)   Collection Time: 02/01/24 11:05 AM   Specimen: BLOOD  Result Value Ref  Range Status   Specimen Description BLOOD BLOOD LEFT ARM wrist  Final   Special Requests   Final    BOTTLES DRAWN AEROBIC AND ANAEROBIC Blood Culture adequate volume   Culture   Final    NO GROWTH 3 DAYS Performed at [facility], [address], [place], [place] [address]    Report Status PENDING  Incomplete  MRSA Next Gen by PCR, Nasal     Status: None   Collection Time: 02/01/24  2:01 PM   Specimen: Nasal Mucosa; Nasal Swab  Result Value Ref Range Status   MRSA by PCR Next Gen NOT DETECTED NOT DETECTED Final    Comment: (NOTE) The GeneXpert MRSA Assay (FDA approved for NASAL specimens only), is one component of a comprehensive MRSA colonization surveillance program. It is not intended to diagnose MRSA infection nor to guide or monitor treatment for MRSA infections. Test performance is not FDA approved in patients less than 53 years old. Performed at [facility], [address], [place], [place] [address]   Cath Tip Culture     Status: None   Collection Time: 02/01/24  3:40 PM   Specimen: Catheter Tip; Other  Result Value Ref Range Status   Specimen Description   Final    CATH TIP Performed at [facility], [address], [place], [place] [address]    Special Requests   Final    NONE Performed at [facility], [address] [place], [place] [address]    Culture   Final    NO GROWTH 2 DAYS Performed at [facility], [address] [place], [place] [address]    Report Status 02/04/2024 FINAL  Final  Urine Culture     Status: Abnormal   Collection Time: 02/01/24  3:50 PM   Specimen: Urine, Random  Result Value Ref Range Status   Specimen Description   Final    URINE, RANDOM Performed at [facility], [address]  [place], [place] [address]    Special Requests   Final    NONE Reflexed from [id number] Performed at [facility], [address], [place], [place] [address]    Culture (A)  Final    <10,000 COLONIES/mL INSIGNIFICANT GROWTH Performed at [facility], [address], [place], [place] [address]    Report Status 02/02/2024 FINAL  Final     Scheduled Meds:  apixaban   2.5 mg Oral BID   Chlorhexidine  Gluconate Cloth  6 each Topical Q0600   hydrocerin   Topical BID   midodrine   10 mg Oral TID WC   pantoprazole  (PROTONIX ) IV  40 mg Intravenous Q24H   Continuous Infusions:  amiodarone  30 mg/hr (02/04/24 0555)   cefTRIAXone  (ROCEPHIN )  IV Stopped (02/03/24 1321)   norepinephrine  (LEVOPHED ) Adult infusion      Procedures/Studies: DG Chest Port 1 View Result Date: 02/01/2024 CLINICAL DATA:  Questionable sepsis - evaluate for abnormality. Hypotension and tachycardia. EXAM: PORTABLE CHEST 1 VIEW COMPARISON:  08/29/2023. FINDINGS: Low lung volume. There are nonspecific opacities throughout bilateral lungs, grossly similar to the prior study favored to represent underlying fibrosis/scarring. No frank pulmonary edema. No acute dense consolidation or lung collapse. Bilateral costophrenic angles are clear. Note is made of elevated left hemidiaphragm. Stable cardio-mediastinal silhouette. No acute osseous abnormalities. The soft tissues are within normal limits. IMPRESSION: No active disease. Electronically Signed   By: Ree Molt M.D.   On: 02/01/2024 11:54    Alm Schneider, DO  [facility]  If 7PM-7AM, please contact night-coverage [email or web address] Password [email or web address] 02/04/2024, 11:02 AM   LOS: 3 days

## 2024-02-04 NOTE — Progress Notes (Signed)
 Patient requesting boot off for sleep. Foot propped on pillow to offload heel from bed. Multiple attempts made to put boot back on, which patient declines.

## 2024-02-04 NOTE — Progress Notes (Signed)
*  PRELIMINARY RESULTS* Echocardiogram 2D Echocardiogram has been performed.  Caleb [name] 02/04/2024, 3:34 PM

## 2024-02-04 NOTE — Plan of Care (Signed)

## 2024-02-04 NOTE — TOC Progression Note (Signed)
 Transition of Care [facility]) - Progression Note    Patient Details  Name: Caleb [name] MRN: [id number] Date of Birth: 12-May-1925  Transition of Care [facility]) CM/SW Contact  Rollo Petri, LCSW Phone Number: 02/04/2024, 10:44 AM  Clinical Narrative:     TOC following. MD stating pt may now be ready for dc back to [facility] tomorrow.  Updated Kerri at [facility]. Weekend TOC will follow.  Expected Discharge Plan: Skilled Nursing Facility Barriers to Discharge: Continued Medical Work up  Expected Discharge Plan and Services In-house Referral: Clinical Social Work   Post Acute Care Choice: Skilled Nursing Facility Living arrangements for the past 2 months: Skilled Nursing Facility                                       Social Determinants of Health (SDOH) Interventions SDOH Screenings   Food Insecurity: No Food Insecurity (02/01/2024)  Housing: Low Risk  (02/03/2024)  Transportation Needs: No Transportation Needs (02/01/2024)  Utilities: Not At Risk (02/01/2024)  Depression (PHQ2-9): Low Risk  (01/27/2024)  Social Connections: Unknown (02/03/2024)  Tobacco Use: Medium Risk (02/01/2024)    Readmission Risk Interventions    02/02/2024   10:19 AM 07/15/2021    1:30 PM 07/14/2021   10:40 AM  Readmission Risk Prevention Plan  Post Dischage Appt  Complete   Medication Screening   Complete  Transportation Screening Complete  Complete  HRI or Home Care Consult Complete    Social Work Consult for Recovery Care Planning/Counseling Complete    Medication Review Oceanographer) Complete

## 2024-02-04 NOTE — Care Management Important Message (Signed)
 Important Message  Patient Details  Name: Caleb [name] MRN: [id number] Date of Birth: Jul 09, 1925   Important Message Given:  Yes - Medicare IM     Caleb [name] 02/04/2024, 4:16 PM

## 2024-02-04 NOTE — Progress Notes (Addendum)
 Patient Name: Caleb [name] Date of Encounter: 02/04/2024 [facility] Cardiologist: Previously Caleb [name] (2020) and evaluated by Caleb [name] in 2024   Interval Summary  .    A&Ox3 and asking for boiled shrimp from the fridge (patient's nurse actually informed me that family members brought cocktail shrimp for his birthday yesterday). He denies any chest pain or palpitations. Says he slept well overnight.   Vital Signs .    Vitals:   02/04/24 0400 02/04/24 0454 02/04/24 0500 02/04/24 0600  BP: (!) 126/98  117/75 116/67  Pulse: (!) 102  95 88  Resp: 20  17 17   Temp:  98 F (36.7 C)    TempSrc:  Oral    SpO2: 100%  99% 99%  Weight:    121 kg  Height:        Intake/Output Summary (Last 24 hours) at 02/04/2024 0902 Last data filed at 02/04/2024 0600 Gross per 24 hour  Intake 509.94 ml  Output 1500 ml  Net -990.06 ml      02/04/2024    6:00 AM 02/02/2024    5:00 AM 02/01/2024    2:19 PM  Last 3 Weights  Weight (lbs) 266 lb 12.1 oz 256 lb 2.8 oz 254 lb 6.6 oz  Weight (kg) 121 kg 116.2 kg 115.4 kg      Telemetry/ECG/Cardiac Studies    Telemetry: Atrial fibrillation, HR in 90's to low-100's with occasional PVC's.  - Personally Reviewed  Echocardiogram: 06/2023 IMPRESSIONS    1. Left ventricular ejection fraction, by estimation, is 55 to 60%. The  left ventricle has normal function. The left ventricle has no regional  wall motion abnormalities. There is moderate concentric left ventricular  hypertrophy. Left ventricular  diastolic parameters are consistent with Grade II diastolic dysfunction  (pseudonormalization).   2. Right ventricular systolic function is normal. The right ventricular  size is normal.   3. Left atrial size was severely dilated.   4. Right atrial size was mildly dilated.   5. The mitral valve is degenerative. Mild to moderate mitral valve  regurgitation.   6. The aortic valve is tricuspid. There is moderate calcification of the  aortic valve.  There is moderate thickening of the aortic valve. Aortic  valve regurgitation is not visualized. Mild aortic valve stenosis.   7. There is mild (Grade II) atheroma plaque involving the aortic root and  ascending aorta.   8. The inferior vena cava is dilated in size with <50% respiratory  variability, suggesting right atrial pressure of 15 mmHg.    Physical Exam .    GEN: Elderly male appearing in no acute distress.    Neck: No JVD Cardiac: Irregularly irregular, 2/6 systolic murmur along RUSB.  Respiratory: Clear to auscultation bilaterally. GI: Soft, nontender, non-distended  MS: Chronic appearing 2+ pitting edema. Dressings in place.   Assessment & Plan .     1. Atrial Fibrillation with RVR - Known history of paroxysmal atrial fibrillation but noted to have atrial fibrillation with RVR this admission in the setting of his acute illness. Options for medical therapy have been limited as he was hypotensive which did not allow for Cardizem or beta-blocker therapy and Digoxin is not ideal given his AKI. Has been on IV Amiodarone  and rates overall well-controlled in the 90's to low-100's. Would continue for now. As BP improves, can likely transition to beta-blocker therapy.  - CHA2DS2-VASc score is at least 4. Remains on Eliquis  2.5 mg twice daily for anticoagulation.  Renal  function has improved with creatinine now at 1.50. If creatinine remains less than 1.5, he would require dose adjustment to 5 mg twice daily.     2. CAD - He has a history of known CAD with stenting to the RCA in 2006 and he did have a medically managed NSTEMI in 06/2023 as he was not felt to be a candidate for cardiac catheterization given his age and comorbidities.   - No reports of recent anginal symptoms. He has not been on ASA given the need for Eliquis  and has not been on statin therapy as this was previously discontinued (presumably due to his age).   3. Lower Extremity Edema - Chronic. Repeat echo is pending to  assess LV function and wall motion. Weight listed at 254 lbs on admission and at 266 lbs today (?). Will require IV Lasix  for diuresis prior to discharge back to SNF.    4. Sepsis in the setting of Bacteremia and Cellulitis - Unclear cause but felt to be due to cellulitis along his lower extremities or due to a PICC line which was removed close to the time of admission. Was treated with antibiotics this admission. Management per the admitting team.    5.  Hypotension - Overall improved as BP has been at 99/64  -145/93 within the past 24 hours. Remains on Midodrine  10 mg 3 times daily. Spironolactone  currently held.    6. Acute on Chronic Stage 3 CKD - Kidney function continues to improve as his creatinine peaked at 2.93 and has improved to 1.50 by recheck today.   For questions or updates, please contact [facility] Please consult [email or web address] for contact info under        Signed, Caleb CHRISTELLA Qua, PA-C    Attending note:  Patient seen and examined.  Interval chart reviewed.  Mr. [name] does not report any chest pain or sense of palpitations this morning.  He continues on IV amiodarone  with adequate heart rate control in atrial fibrillation and also remains on Eliquis .  Afebrile, heart rate in the 80-100 range in atrial fibrillation by telemetry, systolic running 884-849.  He also continues on midodrine .  Cardiac exam with irregular irregular rhythm and 2/6 systolic murmur, 2+ peripheral edema noted.  Pertinent lab work includes potassium 4.1, creatinine 1.5, AST 79, ALT 101 (decreased), hemoglobin 9.9.  Follow-up echocardiogram pending.  No change in cardiac regimen for now.  If blood pressure further stabilizes with concurrent treatment for sepsis, would consider trying to transition from IV amiodarone  to oral beta-blocker.  Caleb [name], M.D., F.A.C.C.

## 2024-02-05 DIAGNOSIS — R339 Retention of urine, unspecified: Secondary | ICD-10-CM | POA: Diagnosis not present

## 2024-02-05 DIAGNOSIS — R7881 Bacteremia: Secondary | ICD-10-CM | POA: Diagnosis not present

## 2024-02-05 DIAGNOSIS — L03115 Cellulitis of right lower limb: Secondary | ICD-10-CM | POA: Diagnosis not present

## 2024-02-05 DIAGNOSIS — A419 Sepsis, unspecified organism: Secondary | ICD-10-CM | POA: Diagnosis not present

## 2024-02-05 DIAGNOSIS — I872 Venous insufficiency (chronic) (peripheral): Secondary | ICD-10-CM | POA: Diagnosis not present

## 2024-02-05 DIAGNOSIS — R262 Difficulty in walking, not elsewhere classified: Secondary | ICD-10-CM | POA: Diagnosis not present

## 2024-02-05 DIAGNOSIS — I739 Peripheral vascular disease, unspecified: Secondary | ICD-10-CM | POA: Diagnosis not present

## 2024-02-05 DIAGNOSIS — I2510 Atherosclerotic heart disease of native coronary artery without angina pectoris: Secondary | ICD-10-CM | POA: Diagnosis not present

## 2024-02-05 DIAGNOSIS — I700 Atherosclerosis of aorta: Secondary | ICD-10-CM | POA: Diagnosis not present

## 2024-02-05 DIAGNOSIS — D6869 Other thrombophilia: Secondary | ICD-10-CM | POA: Diagnosis not present

## 2024-02-05 DIAGNOSIS — R6520 Severe sepsis without septic shock: Secondary | ICD-10-CM | POA: Diagnosis not present

## 2024-02-05 DIAGNOSIS — R29898 Other symptoms and signs involving the musculoskeletal system: Secondary | ICD-10-CM | POA: Diagnosis not present

## 2024-02-05 DIAGNOSIS — N179 Acute kidney failure, unspecified: Secondary | ICD-10-CM | POA: Diagnosis not present

## 2024-02-05 DIAGNOSIS — S81802D Unspecified open wound, left lower leg, subsequent encounter: Secondary | ICD-10-CM | POA: Diagnosis not present

## 2024-02-05 DIAGNOSIS — N1832 Chronic kidney disease, stage 3b: Secondary | ICD-10-CM | POA: Diagnosis not present

## 2024-02-05 DIAGNOSIS — F0150 Vascular dementia without behavioral disturbance: Secondary | ICD-10-CM | POA: Diagnosis not present

## 2024-02-05 DIAGNOSIS — E871 Hypo-osmolality and hyponatremia: Secondary | ICD-10-CM | POA: Diagnosis not present

## 2024-02-05 DIAGNOSIS — R29818 Other symptoms and signs involving the nervous system: Secondary | ICD-10-CM | POA: Diagnosis not present

## 2024-02-05 DIAGNOSIS — R052 Subacute cough: Secondary | ICD-10-CM | POA: Diagnosis not present

## 2024-02-05 DIAGNOSIS — E785 Hyperlipidemia, unspecified: Secondary | ICD-10-CM | POA: Diagnosis not present

## 2024-02-05 DIAGNOSIS — A401 Sepsis due to streptococcus, group B: Secondary | ICD-10-CM | POA: Diagnosis not present

## 2024-02-05 DIAGNOSIS — R488 Other symbolic dysfunctions: Secondary | ICD-10-CM | POA: Diagnosis not present

## 2024-02-05 DIAGNOSIS — I480 Paroxysmal atrial fibrillation: Secondary | ICD-10-CM | POA: Diagnosis not present

## 2024-02-05 DIAGNOSIS — R1312 Dysphagia, oropharyngeal phase: Secondary | ICD-10-CM | POA: Diagnosis not present

## 2024-02-05 DIAGNOSIS — E43 Unspecified severe protein-calorie malnutrition: Secondary | ICD-10-CM | POA: Diagnosis not present

## 2024-02-05 DIAGNOSIS — N319 Neuromuscular dysfunction of bladder, unspecified: Secondary | ICD-10-CM | POA: Diagnosis not present

## 2024-02-05 DIAGNOSIS — H353211 Exudative age-related macular degeneration, right eye, with active choroidal neovascularization: Secondary | ICD-10-CM | POA: Diagnosis not present

## 2024-02-05 DIAGNOSIS — R4189 Other symptoms and signs involving cognitive functions and awareness: Secondary | ICD-10-CM | POA: Diagnosis not present

## 2024-02-05 DIAGNOSIS — M6281 Muscle weakness (generalized): Secondary | ICD-10-CM | POA: Diagnosis not present

## 2024-02-05 DIAGNOSIS — D72828 Other elevated white blood cell count: Secondary | ICD-10-CM | POA: Diagnosis not present

## 2024-02-05 DIAGNOSIS — K219 Gastro-esophageal reflux disease without esophagitis: Secondary | ICD-10-CM | POA: Diagnosis not present

## 2024-02-05 DIAGNOSIS — E222 Syndrome of inappropriate secretion of antidiuretic hormone: Secondary | ICD-10-CM | POA: Diagnosis not present

## 2024-02-05 LAB — COMPREHENSIVE METABOLIC PANEL
ALT: 78 U/L — ABNORMAL HIGH (ref 0–44)
AST: 50 U/L — ABNORMAL HIGH (ref 15–41)
Albumin: 2.2 g/dL — ABNORMAL LOW (ref 3.5–5.0)
Alkaline Phosphatase: 140 U/L — ABNORMAL HIGH (ref 38–126)
Anion gap: 7 (ref 5–15)
BUN: 39 mg/dL — ABNORMAL HIGH (ref 8–23)
CO2: 21 mmol/L — ABNORMAL LOW (ref 22–32)
Calcium: 8.4 mg/dL — ABNORMAL LOW (ref 8.9–10.3)
Chloride: 107 mmol/L (ref 98–111)
Creatinine, Ser: 1.48 mg/dL — ABNORMAL HIGH (ref 0.61–1.24)
GFR, Estimated: 43 mL/min — ABNORMAL LOW (ref >60)
Glucose, Bld: 92 mg/dL (ref 70–99)
Potassium: 4.3 mmol/L (ref 3.5–5.1)
Sodium: 135 mmol/L (ref 135–145)
Total Bilirubin: 0.5 mg/dL (ref 0.0–1.2)
Total Protein: 5.2 g/dL — ABNORMAL LOW (ref 6.5–8.1)

## 2024-02-05 LAB — CBC WITH DIFFERENTIAL/PLATELET
Abs Immature Granulocytes: 0.08 10*3/uL — ABNORMAL HIGH (ref 0.00–0.07)
Basophils Absolute: 0 10*3/uL (ref 0.0–0.1)
Basophils Relative: 0 %
Eosinophils Absolute: 0.2 10*3/uL (ref 0.0–0.5)
Eosinophils Relative: 2 %
HCT: 28.8 % — ABNORMAL LOW (ref 39.0–52.0)
Hemoglobin: 9.2 g/dL — ABNORMAL LOW (ref 13.0–17.0)
Immature Granulocytes: 1 %
Lymphocytes Relative: 11 %
Lymphs Abs: 0.9 10*3/uL (ref 0.7–4.0)
MCH: 31.3 pg (ref 26.0–34.0)
MCHC: 31.9 g/dL (ref 30.0–36.0)
MCV: 98 fL (ref 80.0–100.0)
Monocytes Absolute: 0.8 10*3/uL (ref 0.1–1.0)
Monocytes Relative: 9 %
Neutro Abs: 6.6 10*3/uL (ref 1.7–7.7)
Neutrophils Relative %: 77 %
Platelets: 180 10*3/uL (ref 150–400)
RBC: 2.94 MIL/uL — ABNORMAL LOW (ref 4.22–5.81)
RDW: 16.9 % — ABNORMAL HIGH (ref 11.5–15.5)
WBC: 8.6 10*3/uL (ref 4.0–10.5)
nRBC: 0 % (ref 0.0–0.2)

## 2024-02-05 LAB — MAGNESIUM: Magnesium: 2.2 mg/dL (ref 1.7–2.4)

## 2024-02-05 MED ORDER — METOPROLOL TARTRATE 25 MG PO TABS
37.5000 mg | ORAL_TABLET | Freq: Two times a day (BID) | ORAL | Status: DC
  Administered 2024-02-05: 37.5 mg via ORAL
  Filled 2024-02-05: qty 2

## 2024-02-05 MED ORDER — AMOXICILLIN 500 MG PO CAPS: 1000.0000 mg | ORAL_CAPSULE | Freq: Two times a day (BID) | ORAL | Status: DC

## 2024-02-05 MED ORDER — METOPROLOL TARTRATE 37.5 MG PO TABS: 37.5000 mg | ORAL_TABLET | Freq: Two times a day (BID) | ORAL | Status: DC

## 2024-02-05 MED ORDER — AMOXICILLIN 250 MG PO CAPS: 1000.0000 mg | ORAL_CAPSULE | Freq: Two times a day (BID) | ORAL | Status: DC

## 2024-02-05 NOTE — Progress Notes (Signed)
 Report called to [facility] for patient transport to room 122.  Per RN request to leave IV access in.

## 2024-02-05 NOTE — Discharge Summary (Signed)
 Physician Discharge Summary   Patient: Caleb [name] MRN: [id number] DOB: [birth year]  Admit date:     02/01/2024  Discharge date: 02/05/24  Discharge Physician: Alm Jaelynn Currier   PCP: Tish Elsie FALCON, MD   Recommendations at discharge:   Please follow up with primary care provider within 1-2 weeks  Please repeat BMP and LFTs in 1-2 weeks Increase apixaban  dose to 5 mg bid if serum creatinine remains less than 1.50 with BMP recheck    Hospital Course: 89 year old gentleman longtime resident of the [facility] with history of vascular dementia, ADH, coronary artery disease status post CABG, peripheral vascular disease with critical ischemia in the lower extremities no longer an operative candidate, hyperlipidemia, history of penile cancer status post partial penectomy with chronic indwelling Foley catheter, chronic dependent edema in the legs, GERD, hypertension, DNR, physical debility and frequent falls, paroxysmal A-fib not anticoagulated due to history of GI bleeding 2022, stage IIIb CKD, neurogenic bladder apparently according to reports from family he has been dealing with an ulceration involving the left leg for about 5 weeks.  It started as a blister and subsequently has gotten larger and larger and now has become infected.  The lesion thought to be secondary to poor circulation as a result of the critical limb ischemia that he is dealing with. Patient is No longer ambulatory and has to be fed by staff members.  He was noted to have a low-grade fever and upper respiratory symptoms over the past several days and was seen by the practitioner at the [facility] on 88/02/2023 and noted to have a WBC of 19.3.  He was noted to have a red hot right lower extremity concerning for cellulitis.  He was started on Zosyn om 01/31/24  He was subsequently sent to ED by EMS due to hypotension and tachycardia with concern for sepsis.  He arrived with low blood pressure and tachycardic heart rate with an elevated  lactic acid of 4.1.  Code sepsis called and he was hydrated with IV fluids and started on broad-spectrum antibiotic therapy after blood cultures.  GOC Discussion with his son at time of admission by ED physician and admitting physician and he was agreeable to a trial of IV fluid and IV antibiotics but did not want to escalate care beyond that.  If he did not improve would want the focus of patient's care to be primarily on comfort and dignity.   The patient was started on intravenous vancomycin  and cefepime  initially.  He was started on IV fluids.  His mental status gradually improved.  Although his blood pressure was initially low, this did gradually improve without vasopressor support.  The patient did develop atrial fibrillation with RVR during hospitalization.  He required amiodarone  drip.  Echocardiogram showed EF 60 to 65%, normal RVF, no WMA, moderate aortic stenosis.  As his infectious process was treated, his atrial fibrillation gradually improved.  He was started on metoprolol  for rate control of his atrial fibrillation.  He was weaned off of amiodarone , and his blood pressure and heart rate remained controlled.  He was discharged back to [facility] with 5 more days of amoxil .  Assessment and Plan: Severe sepsis -Secondary to Bacteremia and cellulitis of the leg -Presented with leukocytosis and tachycardia with lactic acid of 4.1 -UA 21-50 WBC -Follow urine culture <10K colonies -Personally reviewed chest x-ray--no infiltrates or edema -01/31/2024 blood culture--group B streptococcus -Initially started on vancomycin , cefepime , metronidazole  -Discontinue vancomycin  metronidazole  and cefepime  -continued ceftriaxone  -d/c  to [facility] with amoxil  x 5 more days   Pyuria -Patient has chronic indwelling Foley catheter -culture <10K   Group B streptococcus bacteremia -Source is likely leg infection vs PICC -Patient had a PICC line prior to admission--removed 2/4 -Follow repeat blood  cultures--neg to date -continued ceftriaxone  -d/c to [facility] with amoxil  x 5 more days   Paroxysmal atrial fibrillation with RVR -2/5--started on amiodarone  drip>>plan to transition to po diltiazem if possible due to elevated LFTs -continue apixaban --discussed with family--ok to to continue -started metoprolol  with BP improved and stable -metoprolol  increased to 37.5 mg bid, weaned off amio drip -if serum creatinine remains <1.50 with BMP recheck in a week, would increase apixaban  to 5 mg bid   Chronic ulceration left lower extremity -Does not appear to be infected on examination -secondary to known critical ischemia in the legs -wound care RN consult appreciated -he is not an operative candidate for invasive procedures -treating supportively   Acute on chronic renal failure--CKD stage IIIb -Baseline creatinine 1.4-1.7 -Presented with serum creatinine 2.93 -Secondary to sepsis and hemodynamic changes by depletion -Foley catheter exchanged to 02/01/24 -improved with IVF and treatment of infection -serum creatinine 1.48 on day of dc   Coronary disease -No chest pain presently   Chronic constipation -Continue daily MiraLAX    History of penile cancer/neurogenic bladder -Patient has chronic indwelling Foley              Consultants: cardiology Procedures performed: none  Disposition: Skilled nursing facility Diet recommendation:  Regular diet DISCHARGE MEDICATION: Allergies as of 02/05/2024   No Known Allergies      Medication List     STOP taking these medications    piperacillin-tazobactam 2.25 g in sodium chloride  0.9 % 50 mL   spironolactone  25 MG tablet Commonly known as: ALDACTONE        TAKE these medications    acetaminophen  325 MG tablet Commonly known as: TYLENOL  Take 650 mg by mouth every 6 (six) hours as needed for fever or moderate pain (pain score 4-6).   amoxicillin  500 MG capsule Commonly known as: AMOXIL  Take 2 capsules (1,000 mg total) by  mouth every 12 (twelve) hours. X 5 more days (last day 02/10/24) Start taking on: February 06, 2024   Eliquis  2.5 MG Tabs tablet Generic drug: apixaban  Take 2.5 mg by mouth 2 (two) times daily.   Metoprolol  Tartrate 37.5 MG Tabs Take 1 tablet (37.5 mg total) by mouth 2 (two) times daily.   miconazole 2 % powder Commonly known as: MICOTIN Apply topically in the morning and at bedtime.   PreserVision AREDS 2 Caps Take 1 tablet by mouth daily.   sennosides-docusate sodium  8.6-50 MG tablet Commonly known as: SENOKOT-S Take 1 tablet by mouth daily.   sodium chloride  1 g tablet Take 1 tablet (1 g total) by mouth 2 (two) times daily with a meal.        Discharge Exam: Filed Weights   02/02/24 0500 02/04/24 0600 02/05/24 0500  Weight: 116.2 kg 121 kg 121.5 kg   HEENT:  [place]/AT, No thrush, no icterus CV:  RRR, no rub, no S3, no S4 Lung:  bibasilar crackles.  No wheeze Abd:  soft/+BS, NT Ext:  1 + LE edema, no lymphangitis, no synovitis, no rash   Condition at discharge: stable  The results of significant diagnostics from this hospitalization (including imaging, microbiology, ancillary and laboratory) are listed below for reference.   Imaging Studies: ECHOCARDIOGRAM COMPLETE Result Date: 02/04/2024    ECHOCARDIOGRAM REPORT  Patient Name:   LAMOINE MAGALLON Date of Exam: 02/04/2024 Medical Rec #:  [id number]       Height:       70.0 in Accession #:    [id number]      Weight:       266.8 lb Date of Birth:  [birth year]        BSA:          2.359 m Patient Age:    89 years        BP:           141/106 mmHg Patient Gender: M               HR:           97 bpm. Exam Location:  [facility] Procedure: 2D Echo, Cardiac Doppler and Color Doppler Indications:    Bacteremia R78.81  History:        Patient has prior history of Echocardiogram examinations, most                 recent 07/02/2023. CAD, Arrythmias:Atrial Fibrillation; Risk                 Factors:Dyslipidemia and Former Smoker.  Sonographer:     Aida Pizza RCS Referring Phys: [phone] Tamico Mundo  Sonographer Comments: Suboptimal subcostal window. IMPRESSIONS  1. Left ventricular ejection fraction, by estimation, is 60 to 65%. The left ventricle has normal function. The left ventricle has no regional wall motion abnormalities. There is severe concentric left ventricular hypertrophy. Left ventricular diastolic  parameters are indeterminate.  2. Right ventricular systolic function is normal. The right ventricular size is normal. Tricuspid regurgitation signal is inadequate for assessing PA pressure.  3. Left atrial size was severely dilated.  4. Right atrial size was severely dilated.  5. The mitral valve is degenerative. Trivial mitral valve regurgitation. Moderate mitral annular calcification.  6. The aortic valve is tricuspid. There is moderate calcification of the aortic valve. Aortic valve regurgitation is not visualized. Moderate aortic valve stenosis, paradoxical normal flow/low gradient. Aortic valve mean gradient 15.0 mmHg. Aortic valve  area, by VTI measures 1.28 cm2. Dimentionless index 0.26.  7. Aortic dilatation noted. There is mild dilatation of the aortic root, measuring 41 mm.  8. The inferior vena cava is dilated in size with <50% respiratory variability, suggesting right atrial pressure of 15 mmHg. Comparison(s): Prior images reviewed side by side. LVEF normal range at 60-65%. Moderate aortic stenosis as described. FINDINGS  Left Ventricle: Left ventricular ejection fraction, by estimation, is 60 to 65%. The left ventricle has normal function. The left ventricle has no regional wall motion abnormalities. The left ventricular internal cavity size was normal in size. There is  severe concentric left ventricular hypertrophy. Left ventricular diastolic function could not be evaluated due to atrial fibrillation. Left ventricular diastolic parameters are indeterminate. Right Ventricle: The right ventricular size is normal. No increase in right  ventricular wall thickness. Right ventricular systolic function is normal. Tricuspid regurgitation signal is inadequate for assessing PA pressure. Left Atrium: Left atrial size was severely dilated. Right Atrium: Right atrial size was severely dilated. Pericardium: There is no evidence of pericardial effusion. Mitral Valve: The mitral valve is degenerative in appearance. Moderate mitral annular calcification. Trivial mitral valve regurgitation. Tricuspid Valve: The tricuspid valve is grossly normal. Tricuspid valve regurgitation is trivial. Aortic Valve: The aortic valve is tricuspid. There is moderate calcification of the aortic valve. There is mild aortic valve annular calcification. Aortic  valve regurgitation is not visualized. Moderate aortic stenosis is present. Aortic valve mean gradient measures 15.0 mmHg. Aortic valve peak gradient measures 24.8 mmHg. Aortic valve area, by VTI measures 1.28 cm. Pulmonic Valve: The pulmonic valve was grossly normal. Pulmonic valve regurgitation is trivial. Aorta: Aortic dilatation noted. There is mild dilatation of the aortic root, measuring 41 mm. Venous: The inferior vena cava is dilated in size with less than 50% respiratory variability, suggesting right atrial pressure of 15 mmHg. IAS/Shunts: No atrial level shunt detected by color flow Doppler.  LEFT VENTRICLE PLAX 2D LVIDd:         4.00 cm LVIDs:         2.30 cm LV PW:         1.30 cm LV IVS:        1.80 cm LVOT diam:     2.50 cm LV SV:         63 LV SV Index:   27 LVOT Area:     4.91 cm  RIGHT VENTRICLE TAPSE (M-mode): 1.9 cm LEFT ATRIUM              Index        RIGHT ATRIUM           Index LA diam:        3.50 cm  1.48 cm/m   RA Area:     31.50 cm LA Vol (A2C):   99.7 ml  42.26 ml/m  RA Volume:   125.00 ml 52.99 ml/m LA Vol (A4C):   131.0 ml 55.53 ml/m LA Biplane Vol: 114.0 ml 48.32 ml/m  AORTIC VALVE AV Area (Vmax):    1.52 cm AV Area (Vmean):   1.43 cm AV Area (VTI):     1.28 cm AV Vmax:           249.00  cm/s AV Vmean:          183.500 cm/s AV VTI:            0.496 m AV Peak Grad:      24.8 mmHg AV Mean Grad:      15.0 mmHg LVOT Vmax:         77.00 cm/s LVOT Vmean:        53.400 cm/s LVOT VTI:          0.129 m LVOT/AV VTI ratio: 0.26  AORTA Ao Root diam: 4.10 cm MITRAL VALVE MV Area (PHT): 3.77 cm     SHUNTS MV Decel Time: 201 msec     Systemic VTI:  0.13 m MV E velocity: 113.00 cm/s  Systemic Diam: 2.50 cm Jayson Sierras MD Electronically signed by Jayson Sierras MD Signature Date/Time: 02/04/2024/4:33:03 PM    Final    DG Chest Port 1 View Result Date: 02/01/2024 CLINICAL DATA:  Questionable sepsis - evaluate for abnormality. Hypotension and tachycardia. EXAM: PORTABLE CHEST 1 VIEW COMPARISON:  08/29/2023. FINDINGS: Low lung volume. There are nonspecific opacities throughout bilateral lungs, grossly similar to the prior study favored to represent underlying fibrosis/scarring. No frank pulmonary edema. No acute dense consolidation or lung collapse. Bilateral costophrenic angles are clear. Note is made of elevated left hemidiaphragm. Stable cardio-mediastinal silhouette. No acute osseous abnormalities. The soft tissues are within normal limits. IMPRESSION: No active disease. Electronically Signed   By: Ree Molt M.D.   On: 02/01/2024 11:54    Microbiology: Results for orders placed or performed during the hospital encounter of 02/01/24  Blood Culture (routine x 2)  Status: None (Preliminary result)   Collection Time: 02/01/24 10:56 AM   Specimen: BLOOD  Result Value Ref Range Status   Specimen Description BLOOD RIGHT ARM  Final   Special Requests   Final    BOTTLES DRAWN AEROBIC ONLY Blood Culture adequate volume   Culture   Final    NO GROWTH 4 DAYS Performed at [facility], [address] [place], [place] [address]    Report Status PENDING  Incomplete  Resp panel by RT-PCR (RSV, Flu A&B, Covid) Anterior Nasal Swab     Status: None   Collection Time: 02/01/24 11:00 AM   Specimen:  Anterior Nasal Swab  Result Value Ref Range Status   SARS Coronavirus 2 by RT PCR NEGATIVE NEGATIVE Final    Comment: (NOTE) SARS-CoV-2 target nucleic acids are NOT DETECTED.  The SARS-CoV-2 RNA is generally detectable in upper respiratory specimens during the acute phase of infection. The lowest concentration of SARS-CoV-2 viral copies this assay can detect is 138 copies/mL. A negative result does not preclude SARS-Cov-2 infection and should not be used as the sole basis for treatment or other patient management decisions. A negative result may occur with  improper specimen collection/handling, submission of specimen other than nasopharyngeal swab, presence of viral mutation(s) within the areas targeted by this assay, and inadequate number of viral copies(<138 copies/mL). A negative result must be combined with clinical observations, patient history, and epidemiological information. The expected result is Negative.  Fact Sheet for Patients:  bloggercourse.com  Fact Sheet for Healthcare Providers:  seriousbroker.it  This test is no t yet approved or cleared by the [place]  FDA and  has been authorized for detection and/or diagnosis of SARS-CoV-2 by FDA under an Emergency Use Authorization (EUA). This EUA will remain  in effect (meaning this test can be used) for the duration of the COVID-19 declaration under Section 564(b)(1) of the Act, 21 U.S.C.section 360bbb-3(b)(1), unless the authorization is terminated  or revoked sooner.       Influenza A by PCR NEGATIVE NEGATIVE Final   Influenza B by PCR NEGATIVE NEGATIVE Final    Comment: (NOTE) The Xpert Xpress SARS-CoV-2/FLU/RSV plus assay is intended as an aid in the diagnosis of influenza from Nasopharyngeal swab specimens and should not be used as a sole basis for treatment. Nasal washings and aspirates are unacceptable for Xpert Xpress SARS-CoV-2/FLU/RSV testing.  Fact  Sheet for Patients: bloggercourse.com  Fact Sheet for Healthcare Providers: seriousbroker.it  This test is not yet approved or cleared by the [place]  FDA and has been authorized for detection and/or diagnosis of SARS-CoV-2 by FDA under an Emergency Use Authorization (EUA). This EUA will remain in effect (meaning this test can be used) for the duration of the COVID-19 declaration under Section 564(b)(1) of the Act, 21 U.S.C. section 360bbb-3(b)(1), unless the authorization is terminated or revoked.     Resp Syncytial Virus by PCR NEGATIVE NEGATIVE Final    Comment: (NOTE) Fact Sheet for Patients: bloggercourse.com  Fact Sheet for Healthcare Providers: seriousbroker.it  This test is not yet approved or cleared by the [place]  FDA and has been authorized for detection and/or diagnosis of SARS-CoV-2 by FDA under an Emergency Use Authorization (EUA). This EUA will remain in effect (meaning this test can be used) for the duration of the COVID-19 declaration under Section 564(b)(1) of the Act, 21 U.S.C. section 360bbb-3(b)(1), unless the authorization is terminated or revoked.  Performed at [facility], [address] [place], [place] [address]   Blood  Culture (routine x 2)     Status: None (Preliminary result)   Collection Time: 02/01/24 11:05 AM   Specimen: BLOOD  Result Value Ref Range Status   Specimen Description BLOOD BLOOD LEFT ARM wrist  Final   Special Requests   Final    BOTTLES DRAWN AEROBIC AND ANAEROBIC Blood Culture adequate volume   Culture   Final    NO GROWTH 4 DAYS Performed at [facility], [address], [place], [place] [address]    Report Status PENDING  Incomplete  MRSA Next Gen by PCR, Nasal     Status: None   Collection Time: 02/01/24  2:01 PM   Specimen: Nasal Mucosa; Nasal Swab  Result Value Ref Range Status   MRSA by PCR Next Gen NOT  DETECTED NOT DETECTED Final    Comment: (NOTE) The GeneXpert MRSA Assay (FDA approved for NASAL specimens only), is one component of a comprehensive MRSA colonization surveillance program. It is not intended to diagnose MRSA infection nor to guide or monitor treatment for MRSA infections. Test performance is not FDA approved in patients less than 41 years old. Performed at [facility], [address] [place], [place] [address]   Cath Tip Culture     Status: None   Collection Time: 02/01/24  3:40 PM   Specimen: Catheter Tip; Other  Result Value Ref Range Status   Specimen Description   Final    CATH TIP Performed at [facility], [address], [place], [place] [address]    Special Requests   Final    NONE Performed at [facility], [address] [place], [place] [address]    Culture   Final    NO GROWTH 2 DAYS Performed at [facility], [address] [place], [place] [address]    Report Status 02/04/2024 FINAL  Final  Urine Culture     Status: Abnormal   Collection Time: 02/01/24  3:50 PM   Specimen: Urine, Random  Result Value Ref Range Status   Specimen Description   Final    URINE, RANDOM Performed at [facility], [address] [place], [place] [address]    Special Requests   Final    NONE Reflexed from [id number] Performed at [facility], [address], [place], [place] [address]    Culture (A)  Final    <10,000 COLONIES/mL INSIGNIFICANT GROWTH Performed at [facility], [address], [place], [place] [address]    Report Status 02/02/2024 FINAL  Final    Labs: CBC: Recent Labs  Lab 02/01/24 1105 02/02/24 0349 02/03/24 0643 02/04/24 0409 02/05/24 0330  WBC 19.3* 13.9* 11.8* 9.1 8.6  NEUTROABS 17.8* 12.2* 9.9* 7.1 6.6  HGB 10.6* 8.9* 9.8* 9.9* 9.2*  HCT 32.7* 28.4* 30.2* 30.6* 28.8*  MCV 101.6* 99.6 101.0* 98.4 98.0  PLT 128* 117* 136* 153 180   Basic Metabolic Panel: Recent Labs  Lab 02/01/24 1105 02/02/24 0349 02/03/24 0643  02/04/24 0409 02/05/24 0330  NA 136 135 135 134* 135  K 4.6 4.3 4.2 4.1 4.3  CL 103 109 107 107 107  CO2 20* 20* 21* 21* 21*  GLUCOSE 183* 103* 103* 97 92  BUN 56* 49* 45* 43* 39*  CREATININE 2.93* 2.00* 1.62* 1.50* 1.48*  CALCIUM  8.7* 7.6* 8.0* 8.1* 8.4*  MG  --  2.2 2.3  --  2.2   Liver Function Tests: Recent Labs  Lab 02/01/24 1105 02/02/24 0349 02/03/24 0643 02/04/24 0409 02/05/24 0330  AST 127*  89* 114* 79* 50*  ALT 97* 80* 109* 101* 78*  ALKPHOS 50 44 118 155* 140*  BILITOT 0.7 0.7 0.6 0.6 0.5  PROT 6.6 5.4* 5.5* 5.6* 5.2*  ALBUMIN  3.0* 2.5* 2.5* 2.4* 2.2*   CBG: No results for input(s): GLUCAP in the last 168 hours.  Discharge time spent: greater than 30 minutes.  Signed: Alm Schneider, MD [facility] 02/05/2024

## 2024-02-06 LAB — CULTURE, BLOOD (ROUTINE X 2)
Culture: NO GROWTH
Culture: NO GROWTH
Special Requests: ADEQUATE
Special Requests: ADEQUATE

## 2024-02-07 ENCOUNTER — Encounter: Payer: Self-pay | Admitting: Adult Health

## 2024-02-07 ENCOUNTER — Non-Acute Institutional Stay (SKILLED_NURSING_FACILITY): Payer: Self-pay | Admitting: Adult Health

## 2024-02-07 DIAGNOSIS — N1832 Chronic kidney disease, stage 3b: Secondary | ICD-10-CM | POA: Diagnosis not present

## 2024-02-07 DIAGNOSIS — F0150 Vascular dementia without behavioral disturbance: Secondary | ICD-10-CM

## 2024-02-07 DIAGNOSIS — I739 Peripheral vascular disease, unspecified: Secondary | ICD-10-CM

## 2024-02-07 DIAGNOSIS — E222 Syndrome of inappropriate secretion of antidiuretic hormone: Secondary | ICD-10-CM | POA: Diagnosis not present

## 2024-02-07 DIAGNOSIS — I480 Paroxysmal atrial fibrillation: Secondary | ICD-10-CM | POA: Diagnosis not present

## 2024-02-07 DIAGNOSIS — R4189 Other symptoms and signs involving cognitive functions and awareness: Secondary | ICD-10-CM

## 2024-02-07 DIAGNOSIS — I700 Atherosclerosis of aorta: Secondary | ICD-10-CM | POA: Diagnosis not present

## 2024-02-07 DIAGNOSIS — R29818 Other symptoms and signs involving the nervous system: Secondary | ICD-10-CM | POA: Diagnosis not present

## 2024-02-07 DIAGNOSIS — A401 Sepsis due to streptococcus, group B: Secondary | ICD-10-CM | POA: Diagnosis not present

## 2024-02-07 NOTE — Progress Notes (Signed)
Location:  [facility] Room Number: 140 Place of Service:  SNF (31)   CODE STATUS: dnr   No Known Allergies  Chief Complaint  Patient presents with   Hospitalization Follow-up    HPI:  He is a 89 year old long term resident of this facility who has been hospitalized from 02-01-24 through 02-05-24. His past medical history of: vascular dementia; CAD; status post CABG; PVD with critical ischemia of lower extremity. He presented to the ED for sepsis due to group B strep. He had been treated with pipercillin prior to his hospitalization for right lower extremity cellulitis.  He will continue to be followed for his chronic illnesses including:  Stage 3b chronic  kidney disease (CKD) History of penile cancer/neurogenic bladder:  Chronic constipation     Past Medical History:  Diagnosis Date   Coronary artery disease    Hyperlipidemia    Obesity    Penile cancer (HCC)    Swelling of extremity    Left Leg    Past Surgical History:  Procedure Laterality Date   APPENDECTOMY     BACK SURGERY     BIOPSY  07/22/2021   Procedure: BIOPSY;  Surgeon: Lynann Bologna, MD;  Location: [facility] ENDOSCOPY;  Service: Gastroenterology;;   CHOLECYSTECTOMY     CORONARY ANGIOPLASTY  11/2005   RCA PCI AND STENTING WITH A CYPHER DES   CORONARY ARTERY BYPASS GRAFT     ESOPHAGOGASTRODUODENOSCOPY (EGD) WITH PROPOFOL N/A 07/22/2021   Procedure: ESOPHAGOGASTRODUODENOSCOPY (EGD) WITH PROPOFOL;  Surgeon: Lynann Bologna, MD;  Location: [facility] ENDOSCOPY;  Service: Gastroenterology;  Laterality: N/A;   HIATAL HERNIA REPAIR     lymph node removal     NM MYOCAR MULTIPLE W/SPECT  11/26/2009   EF 62%. NORMAL MYOCARDIAL PERFUSION STUDY.   TRANSTHORACIC ECHOCARDIOGRAM  12/22/2005   MILD TO MOD AORTIC SCLEROSIS W/O STENOSIS. MILD TO MODERATE MITRAL CALCIFICATION. LA- MILDLY DILATED.    Social History   Socioeconomic History   Marital status: Divorced    Spouse name: Not on file   Number of children:  Not on file   Years of education: Not on file   Highest education level: Not on file  Occupational History   Occupation: retired    Associate Professor: RETIRED  Tobacco Use   Smoking status: Former    Current packs/day: 0.00    Types: Cigarettes    Quit date: 07/14/1983    Years since quitting: 40.5   Smokeless tobacco: Never  Vaping Use   Vaping status: Never Used  Substance and Sexual Activity   Alcohol use: No   Drug use: No   Sexual activity: Not on file  Other Topics Concern   Not on file  Social History Narrative   Not on file   Social Drivers of Health   Financial Resource Strain: Not on file  Food Insecurity: No Food Insecurity (02/01/2024)   Hunger Vital Sign    Worried About Running Out of Food in the Last Year: Never true    Ran Out of Food in the Last Year: Never true  Transportation Needs: No Transportation Needs (02/01/2024)   PRAPARE - Administrator, Civil Service (Medical): No    Lack of Transportation (Non-Medical): No  Physical Activity: Not on file  Stress: Not on file  Social Connections: Unknown (02/03/2024)   Social Connection and Isolation Panel [NHANES]    Frequency of Communication with Friends and Family: Patient declined    Frequency of Social Gatherings  with Friends and Family: Patient declined    Attends Religious Services: Not on file    Active Member of Clubs or Organizations: Patient declined    Attends Banker Meetings: Not on file    Marital Status: Patient declined  Intimate Partner Violence: Not At Risk (02/01/2024)   Humiliation, Afraid, Rape, and Kick questionnaire    Fear of Current or Ex-Partner: No    Emotionally Abused: No    Physically Abused: No    Sexually Abused: No   Family History  Problem Relation Age of Onset   Cancer Father    Cancer Sister    Cancer Brother    Cancer Sister    Cancer Other       VITAL SIGNS BP 110/72   Pulse 72   Temp (!) 97.4 F (36.3 C)   Resp 20   Ht 5' 10.5" (1.791 m)    Wt 247 lb 1.6 oz (112.1 kg)   SpO2 96%   BMI 34.95 kg/m   Outpatient Encounter Medications as of 02/07/2024  Medication Sig   acetaminophen (TYLENOL) 325 MG tablet Take 650 mg by mouth every 6 (six) hours as needed for fever or moderate pain (pain score 4-6).   amoxicillin (AMOXIL) 500 MG capsule Take 2 capsules (1,000 mg total) by mouth every 12 (twelve) hours. X 5 more days (last day 02/10/24)   ELIQUIS 2.5 MG TABS tablet Take 2.5 mg by mouth 2 (two) times daily.   metoprolol tartrate 37.5 MG TABS Take 1 tablet (37.5 mg total) by mouth 2 (two) times daily.   miconazole (MICOTIN) 2 % powder Apply topically in the morning and at bedtime.   Multiple Vitamins-Minerals (PRESERVISION AREDS 2) CAPS Take 1 tablet by mouth daily.   sennosides-docusate sodium (SENOKOT-S) 8.6-50 MG tablet Take 1 tablet by mouth daily.   sodium chloride 1 g tablet Take 1 tablet (1 g total) by mouth 2 (two) times daily with a meal.   No facility-administered encounter medications on file as of 02/07/2024.     SIGNIFICANT DIAGNOSTIC EXAMS  08-29-23: wbc 7.8; hgb 11.0; hct 32.0; mcv 94.1 plt 184; glucose 127; bun 40; creat 1.67; k+ 2.7; na++ 122; ca 8.8; gfr 37; protein 6.5 albumin 3.8; mag 3.8; tsh 2.133 09-01-23: glucose 120; bun 43; creat 1.97; k+ 3.9; na++ 132; ca 8.9; gfr 31 09-09-23: glucose 86; bun 37; creat 1.52; k+ 4.5; na++ 137; ca 8.8; gfr 42 09-23-23: d-dimer: 0.65 10-12-23: wbc 6.7; hgb 10.2; hct 32.5; mcv 102.2 plt 171; glucose 84; bun 26; creat 1.45; k+ 4.7; na++ 136; ca 9.0; gfr 44; d-dimer 0.71    TODAY  12-24-23: glucose 117; bun 34; creat 1.56; k+ 4.2; na++ 137; ca 9.3 gfr 40 12-30-23: glucose 79; bun 28; creat 1.45; k+ 4.2; na++ 137; ca 9.0; gfr 44 01-31-24: wbc 19.3; hgb 11.3; hct 35.8; mcv 100.6 plt 152; glucose 129 bun 38; creat 1.96; k+ 4.9; na++ 136; ca 9.0 gfr 31 blood culture: group B strep 02-02-24: wbc 139.; hgb 8.9; hct 28.4; mcv 99.6 plt 117; glucose 103; bun 45; creat 1.62; k+ 4.2; na++ 135; ca  8.0; gfr 38; ast 114 alt 109; protein 5.5 albumin 2.5 02-05-24: wbc 8.6; hgb 9.2; hct 28.8; mcv 98.0 plt 180; glucose 92; bun 39; creat 1.48; k+ 4.3; na++ 135; ca 8.4 gfr 43 ast 50 alt 78; alk phos 140 protein 5.4 albumin 2.2   Review of Systems  Constitutional:  Negative for malaise/fatigue.  Respiratory:  Negative for cough  and shortness of breath.   Cardiovascular:  Negative for chest pain, palpitations and leg swelling.  Gastrointestinal:  Negative for abdominal pain, constipation and heartburn.  Musculoskeletal:  Negative for back pain, joint pain and myalgias.  Skin: Negative.   Neurological:  Negative for dizziness.  Psychiatric/Behavioral:  The patient is not nervous/anxious.    Physical Exam Constitutional:      General: He is not in acute distress.    Appearance: He is well-developed. He is obese. He is not diaphoretic.  HENT:     Mouth/Throat:     Mouth: Mucous membranes are moist.     Pharynx: Oropharynx is clear.  Eyes:     Conjunctiva/sclera: Conjunctivae normal.  Neck:     Thyroid: No thyromegaly.  Cardiovascular:     Rate and Rhythm: Normal rate and regular rhythm.     Heart sounds: Normal heart sounds.  Pulmonary:     Effort: Pulmonary effort is normal. No respiratory distress.     Breath sounds: Normal breath sounds.  Abdominal:     General: Bowel sounds are normal. There is no distension.     Palpations: Abdomen is soft.     Tenderness: There is no abdominal tenderness.  Genitourinary:    Comments: Foley  Musculoskeletal:        General: Normal range of motion.     Cervical back: Neck supple.     Right lower leg: Edema present.     Left lower leg: Edema present.  Lymphadenopathy:     Cervical: No cervical adenopathy.  Skin:    General: Skin is warm and dry.     Comments: Right lower extremity with less redness and swelling present   Neurological:     Mental Status: He is alert. Mental status is at baseline.  Psychiatric:        Mood and Affect: Mood  normal.      ASSESSMENT/ PLAN:  TODAY  Severe sepsis group B strep sepsis: will need to complete amoxicillin 1 gm twice daily for 5 days.   Neurocognitive deficits/vascular dementia without behavioral disturbance: CT 08-29-23 demonstrates: atrophy with chronic small vessel white matter ischemic disease; SLUMS 8/30.   2. Stage 3b chronic  kidney disease (CKD): bun 39; creat 1.48 gfr 43  3. History of penile cancer/neurogenic bladder: has long term foley.   4. Chronic constipation: will continue senna s daily   5. Coronary artery disease involving native coronary artery of native heart without angina pectoris.   6.  Aortic calcification: (Ct 02-17-16)   7. SIADH (syndrome of inappropriate ADH production)/hyponatremia na++ 135; is off NACL   8. Paroxsymal atrial fibrillation: heart rate is stable lopressor 37.5 mg twice daily and eliquis 2.5 mg twice daily   9. Peripheral vascular disease/critical ischemic lower extremities: will monitor   Synthia Innocent NP [facility]   call [phone]

## 2024-02-15 ENCOUNTER — Encounter: Payer: Self-pay | Admitting: Adult Health

## 2024-02-16 ENCOUNTER — Encounter: Payer: Self-pay | Admitting: Adult Health

## 2024-02-16 ENCOUNTER — Encounter: Payer: Self-pay | Admitting: Internal Medicine

## 2024-02-16 ENCOUNTER — Non-Acute Institutional Stay (SKILLED_NURSING_FACILITY): Payer: Self-pay | Admitting: Internal Medicine

## 2024-02-16 DIAGNOSIS — I480 Paroxysmal atrial fibrillation: Secondary | ICD-10-CM | POA: Diagnosis not present

## 2024-02-16 DIAGNOSIS — R6520 Severe sepsis without septic shock: Secondary | ICD-10-CM

## 2024-02-16 DIAGNOSIS — N179 Acute kidney failure, unspecified: Secondary | ICD-10-CM

## 2024-02-16 DIAGNOSIS — L03115 Cellulitis of right lower limb: Secondary | ICD-10-CM | POA: Diagnosis not present

## 2024-02-16 DIAGNOSIS — E46 Unspecified protein-calorie malnutrition: Secondary | ICD-10-CM

## 2024-02-16 DIAGNOSIS — R052 Subacute cough: Secondary | ICD-10-CM

## 2024-02-16 DIAGNOSIS — A401 Sepsis due to streptococcus, group B: Secondary | ICD-10-CM

## 2024-02-16 DIAGNOSIS — E43 Unspecified severe protein-calorie malnutrition: Secondary | ICD-10-CM

## 2024-02-16 HISTORY — DX: Unspecified protein-calorie malnutrition: E46

## 2024-02-16 NOTE — Assessment & Plan Note (Signed)
 Monitor BMET as prelude to Eliquis dose adjustment.

## 2024-02-16 NOTE — Assessment & Plan Note (Signed)
 Current albumin 2.2 and total protein 5.2.IOW on exam.Nutritionist to follow at SNF.

## 2024-02-16 NOTE — Assessment & Plan Note (Signed)
 Rhythm is slightly irregular but rate controlled. Apixaban to be increased to 5 mg twice daily if creatinine remains less than 1.50. Last TSH was 2.133 on 08/29/23.

## 2024-02-16 NOTE — Patient Instructions (Signed)
 See assessment and plan under each diagnosis in the problem list and acutely for this visit

## 2024-02-16 NOTE — Progress Notes (Unsigned)
 NURSING HOME LOCATION:  [facility] ROOM NUMBER:  [id number]  CODE STATUS:  DNR  PCP: Sharee Holster, NP   This is a nursing facility follow up visit for Nursing Facility readmission within 30 days.  Interim medical record and care since last SNF visit was updated with review of diagnostic studies and change in clinical status since last visit were documented.  HPI: He was rehospitalized 2/4 - 02/05/2024 with cellulitis of RLE with leukocytosis which progressed despite initiation of Zosyn since 2/3 via PICC line. This was in context of PVD with critical limb ischemia.  Despite initiation of the antibiotics at the SNF he developed hypotension and tachycardia concerning for sepsis.  In the ED tachycardia was documented and lactic acid level was 4.1.  Peak white count was 19.3.  He received IV fluids and broad-spectrum antibiotic coverage was initiated with vancomycin, metronidazole, and cefepime after collection of blood cultures.  2/3 blood culture revealed group B streptococcus.  PICC line was removed 2/4.   Son was HCPOA did not want escalation of care beyond the IV fluids and antibiotics.  Focus of care was to be comfort and dignity. Triple antibiotic therapy was transition to ceftriaxone.  At discharge he was to continue amoxicillin for 5 additional days. There was gradual improvement in his mental status and blood pressure without vasopressor support.  Course was complicated by development of atrial fibrillation with RVR for which amiodarone drip was initiated.  Echo revealed EF of 60-65% with moderate aortic stenosis.  With improvement in the infectious process; atrial fibrillation improved.  He was transitioned to metoprolol for rate control with weaning of amiodarone.  Low-dose apixaban was continued.  Plan to increase the dose to 5 mg twice daily if repeat creatinine remains less than 1.50. Although UA had revealed 21-50 WBCs in the context of chronic indwelling Foley for  neurogenic bladder.  Urine culture revealed less than 10,000 colonies. Initial GFR was 31 with a nadir value of 19; final GFR was 43 indicating CKD stage IIIb.  Peak creatinine was 2.93 on 2/4 and final value 1.48. Albumin was 2.2 and total protein 5.2 indicating protein/caloric malnutrition.  White blood count normalized with a final value of 8600.  Normochromic, normocytic anemia was relatively stable; final H/H values were 9.2/28.8. Clinically he was felt to be stable enough to return to SNF to complete the oral antibiotics.   . Review of systems: Dementia invalidated responses.  He thought that I had "been here a few days ago."  He has no comprehension of why he was hospitalized and what was recommended.  He did request cough drops on 3 occasions during the interview because of "a cough on occasion."  He denies any active cough or sputum production.  He states that he has been "chilly" most of this morning.  He denies any associated fever.  He denies any other active symptoms.  Constitutional: No fever, significant weight change, fatigue  Eyes: No redness, discharge, pain, vision change ENT/mouth: No nasal congestion,  purulent discharge, earache, change in hearing, sore throat  Cardiovascular: No chest pain, palpitations, paroxysmal nocturnal dyspnea, claudication, edema  Respiratory: No cough, sputum production, hemoptysis, DOE, significant snoring, apnea   Gastrointestinal: No heartburn, dysphagia, abdominal pain, nausea /vomiting, rectal bleeding, melena, change in bowels Genitourinary: No dysuria, hematuria, pyuria, incontinence, nocturia Musculoskeletal: No joint stiffness, joint swelling, weakness, pain Dermatologic: No rash, pruritus, change in appearance of skin Neurologic: No dizziness, headache, syncope, seizures, numbness, tingling Psychiatric: No  significant anxiety, depression, insomnia, anorexia Endocrine: No change in hair/skin/nails, excessive thirst, excessive hunger,  excessive urination  Hematologic/lymphatic: No significant bruising, lymphadenopathy, abnormal bleeding Allergy/immunology: No itchy/watery eyes, significant sneezing, urticaria, angioedema  Physical exam:  Pertinent or positive findings: He appears younger than stated age.  He has slight OD exotropia.  Heart sounds are distant; rate appears slow but rhythm slightly irregular.  He has low-grade rhonchi diffusely.  Bowel sounds were heard in the left chest.  Abdomen is protuberant.  Foley catheter is in place.  Pedal pulses are not palpable as the lower extremities are wrapped to the upper shins.  Despite the wrapping the right lower extremity is visibly larger than the left.  There is tenderness are puffy and the toenail is deformed and discolored.  He has interosseous wasting.  He has ecchymoses of the dorsum of the hands. General appearance: Adequately nourished; no acute distress, increased work of breathing is present.   Lymphatic: No lymphadenopathy about the head, neck, axilla. Eyes: No conjunctival inflammation or lid edema is present. There is no scleral icterus. Ears:  External ear exam shows no significant lesions or deformities.   Nose:  External nasal examination shows no deformity or inflammation. Nasal mucosa are pink and moist without lesions, exudates Oral exam:  Lips and gums are healthy appearing. There is no oropharyngeal erythema or exudate. Neck:  No thyromegaly, masses, tenderness noted.    Heart:  Normal rate and regular rhythm. S1 and S2 normal without gallop, murmur, click, rub .  Lungs: Chest clear to auscultation without wheezes, rhonchi, rales, rubs. Abdomen: Bowel sounds are normal. Abdomen is soft and nontender with no organomegaly, hernias, masses. GU: Deferred  Extremities:  No cyanosis, clubbing, edema  Neurologic exam : Cn 2-7 intact Strength equal  in upper & lower extremities Balance, Rhomberg, finger to nose testing could not be completed due to clinical  state Deep tendon reflexes are equal Skin: Warm & dry w/o tenting. No significant lesions or rash.  See summary under each active problem in the Problem List with associated updated therapeutic plan

## 2024-02-16 NOTE — Assessment & Plan Note (Signed)
 This completed full course of antibiotics and is afebrile.  Wound care nurse to monitor lower extremity lesions.

## 2024-02-17 NOTE — Assessment & Plan Note (Signed)
 Extensive antibiotic course completed.He is afebrile. SNF Wound Care Nurse monitoring resident. BLE wrapping because of intractable edema, R > L.

## 2024-02-18 ENCOUNTER — Encounter: Payer: Self-pay | Admitting: Adult Health

## 2024-02-18 ENCOUNTER — Non-Acute Institutional Stay (SKILLED_NURSING_FACILITY): Payer: Medicare Other | Admitting: Adult Health

## 2024-02-18 DIAGNOSIS — I700 Atherosclerosis of aorta: Secondary | ICD-10-CM

## 2024-02-18 DIAGNOSIS — I739 Peripheral vascular disease, unspecified: Secondary | ICD-10-CM

## 2024-02-18 DIAGNOSIS — E222 Syndrome of inappropriate secretion of antidiuretic hormone: Secondary | ICD-10-CM | POA: Diagnosis not present

## 2024-02-18 NOTE — Progress Notes (Signed)
 Location:  [facility] Room Number: 50 W Place of Service:  SNF (31) Synthia Innocent S,NP  CODE STATUS: DNR  No Known Allergies  Chief Complaint  Patient presents with   Acute Visit    Care plan meeting    HPI:  We have come together for his care plan meeting. Family present. BIMS 15/15 mood 5/30: not sleeping well; decreased energy, depression. He has had no falls. He requires dependent assist with his adl care. He has a chronic foley; is frequently incontinent of bowel. Dietary: regular diet; appetite good has variable weight 247-264 pounds feeds self. Therapy: BCAT 26/50. Max assist with 2 for transfers nursing staff using lift; not ambulatory; poor sit balance upper body min assist lower body dependent; brp dependent. He is having increased anxiety. His family is concerned about his level of anxiety. He will continue to be followed for his chronic illnesses including: Peripheral vascular disease unspecified Aortic calcification   SIADH (syndrome of inappropriate ADH production)  Past Medical History:  Diagnosis Date   Coronary artery disease    Hyperlipidemia    Obesity    Penile cancer (HCC)    Swelling of extremity    Left Leg    Past Surgical History:  Procedure Laterality Date   APPENDECTOMY     BACK SURGERY     BIOPSY  07/22/2021   Procedure: BIOPSY;  Surgeon: Lynann Bologna, MD;  Location: [facility] ENDOSCOPY;  Service: Gastroenterology;;   CHOLECYSTECTOMY     CORONARY ANGIOPLASTY  11/2005   RCA PCI AND STENTING WITH A CYPHER DES   CORONARY ARTERY BYPASS GRAFT     ESOPHAGOGASTRODUODENOSCOPY (EGD) WITH PROPOFOL N/A 07/22/2021   Procedure: ESOPHAGOGASTRODUODENOSCOPY (EGD) WITH PROPOFOL;  Surgeon: Lynann Bologna, MD;  Location: [facility] ENDOSCOPY;  Service: Gastroenterology;  Laterality: N/A;   HIATAL HERNIA REPAIR     lymph node removal     NM MYOCAR MULTIPLE W/SPECT  11/26/2009   EF 62%. NORMAL MYOCARDIAL PERFUSION STUDY.   TRANSTHORACIC ECHOCARDIOGRAM   12/22/2005   MILD TO MOD AORTIC SCLEROSIS W/O STENOSIS. MILD TO MODERATE MITRAL CALCIFICATION. LA- MILDLY DILATED.    Social History   Socioeconomic History   Marital status: Divorced    Spouse name: Not on file   Number of children: Not on file   Years of education: Not on file   Highest education level: Not on file  Occupational History   Occupation: retired    Associate Professor: RETIRED  Tobacco Use   Smoking status: Former    Current packs/day: 0.00    Types: Cigarettes    Quit date: 07/14/1983    Years since quitting: 40.6   Smokeless tobacco: Never  Vaping Use   Vaping status: Never Used  Substance and Sexual Activity   Alcohol use: No   Drug use: No   Sexual activity: Not on file  Other Topics Concern   Not on file  Social History Narrative   Not on file   Social Drivers of Health   Financial Resource Strain: Not on file  Food Insecurity: No Food Insecurity (02/01/2024)   Hunger Vital Sign    Worried About Running Out of Food in the Last Year: Never true    Ran Out of Food in the Last Year: Never true  Transportation Needs: No Transportation Needs (02/01/2024)   PRAPARE - Administrator, Civil Service (Medical): No    Lack of Transportation (Non-Medical): No  Physical Activity: Not on file  Stress: Not on file  Social Connections: Unknown (02/03/2024)   Social Connection and Isolation Panel [NHANES]    Frequency of Communication with Friends and Family: Patient declined    Frequency of Social Gatherings with Friends and Family: Patient declined    Attends Religious Services: Not on Insurance claims handler of Clubs or Organizations: Patient declined    Attends Banker Meetings: Not on file    Marital Status: Patient declined  Intimate Partner Violence: Not At Risk (02/01/2024)   Humiliation, Afraid, Rape, and Kick questionnaire    Fear of Current or Ex-Partner: No    Emotionally Abused: No    Physically Abused: No    Sexually Abused: No   Family  History  Problem Relation Age of Onset   Cancer Father    Cancer Sister    Cancer Brother    Cancer Sister    Cancer Other       VITAL SIGNS BP 119/71   Pulse 84   Temp 98.1 F (36.7 C)   Resp 20   Ht 5\' 10"  (1.778 m)   Wt 264 lb 8 oz (120 kg)   SpO2 95%   BMI 37.95 kg/m   Outpatient Encounter Medications as of 02/18/2024  Medication Sig   acetaminophen (TYLENOL) 325 MG tablet Take 650 mg by mouth every 6 (six) hours as needed for fever or moderate pain (pain score 4-6).   Balsam [place]-Castor Oil (VENELEX) OINT Apply topically as needed.   diclofenac Sodium (VOLTAREN ARTHRITIS PAIN) 1 % GEL Apply topically 3 (three) times daily.   ELIQUIS 2.5 MG TABS tablet Take 2.5 mg by mouth 2 (two) times daily.   metoprolol tartrate 37.5 MG TABS Take 1 tablet (37.5 mg total) by mouth 2 (two) times daily.   miconazole (MICOTIN) 2 % powder Apply topically in the morning and at bedtime.   Multiple Vitamins-Minerals (PRESERVISION AREDS 2) CAPS Take 1 tablet by mouth daily.   sennosides-docusate sodium (SENOKOT-S) 8.6-50 MG tablet Take 1 tablet by mouth daily.   sertraline (ZOLOFT) 50 MG tablet Take 50 mg by mouth daily.   sodium chloride 1 g tablet Take 1 tablet (1 g total) by mouth 2 (two) times daily with a meal.   amoxicillin (AMOXIL) 500 MG capsule Take 2 capsules (1,000 mg total) by mouth every 12 (twelve) hours. X 5 more days (last day 02/10/24) (Patient not taking: Reported on 02/18/2024)   No facility-administered encounter medications on file as of 02/18/2024.     SIGNIFICANT DIAGNOSTIC EXAMS   08-29-23: wbc 7.8; hgb 11.0; hct 32.0; mcv 94.1 plt 184; glucose 127; bun 40; creat 1.67; k+ 2.7; na++ 122; ca 8.8; gfr 37; protein 6.5 albumin 3.8; mag 3.8; tsh 2.133 09-01-23: glucose 120; bun 43; creat 1.97; k+ 3.9; na++ 132; ca 8.9; gfr 31 09-09-23: glucose 86; bun 37; creat 1.52; k+ 4.5; na++ 137; ca 8.8; gfr 42 09-23-23: d-dimer: 0.65 10-12-23: wbc 6.7; hgb 10.2; hct 32.5; mcv 102.2 plt 171;  glucose 84; bun 26; creat 1.45; k+ 4.7; na++ 136; ca 9.0; gfr 44; d-dimer 0.71   12-24-23: glucose 117; bun 34; creat 1.56; k+ 4.2; na++ 137; ca 9.3 gfr 40 12-30-23: glucose 79; bun 28; creat 1.45; k+ 4.2; na++ 137; ca 9.0; gfr 44 01-31-24: wbc 19.3; hgb 11.3; hct 35.8; mcv 100.6 plt 152; glucose 129 bun 38; creat 1.96; k+ 4.9; na++ 136; ca 9.0 gfr 31 blood culture: group B strep 02-02-24: wbc 139.; hgb 8.9; hct 28.4; mcv 99.6 plt 117; glucose  103; bun 45; creat 1.62; k+ 4.2; na++ 135; ca 8.0; gfr 38; ast 114 alt 109; protein 5.5 albumin 2.5 02-05-24: wbc 8.6; hgb 9.2; hct 28.8; mcv 98.0 plt 180; glucose 92; bun 39; creat 1.48; k+ 4.3; na++ 135; ca 8.4 gfr 43 ast 50 alt 78; alk phos 140 protein 5.4 albumin 2.2   NO NEW LABS.   Review of Systems  Constitutional:  Negative for malaise/fatigue.  Respiratory:  Negative for cough and shortness of breath.   Cardiovascular:  Negative for chest pain, palpitations and leg swelling.  Gastrointestinal:  Negative for abdominal pain, constipation and heartburn.  Musculoskeletal:  Negative for back pain, joint pain and myalgias.  Skin: Negative.   Neurological:  Negative for dizziness.  Psychiatric/Behavioral:  The patient is nervous/anxious.    Physical Exam Constitutional:      General: He is not in acute distress.    Appearance: He is well-developed. He is obese. He is not diaphoretic.  Neck:     Thyroid: No thyromegaly.  Cardiovascular:     Rate and Rhythm: Normal rate and regular rhythm.     Heart sounds: Normal heart sounds.  Pulmonary:     Effort: Pulmonary effort is normal. No respiratory distress.     Breath sounds: Normal breath sounds.  Abdominal:     General: Bowel sounds are normal. There is no distension.     Palpations: Abdomen is soft.     Tenderness: There is no abdominal tenderness.  Genitourinary:    Comments: foley Musculoskeletal:        General: Normal range of motion.     Cervical back: Neck supple.     Right lower leg: Edema  present.     Left lower leg: Edema present.  Lymphadenopathy:     Cervical: No cervical adenopathy.  Skin:    General: Skin is warm and dry.  Neurological:     Mental Status: He is alert. Mental status is at baseline.  Psychiatric:        Mood and Affect: Mood normal.     ASSESSMENT/ PLAN:  TODAY  Peripheral vascular disease unspecified Aortic calcification SIADH (syndrome of inappropriate ADH production)  Will begin zoloft 50 mg daily  Will continue therapy as directed Will continue to monitor his status The goal of care is long term placement  Time spent with patient 40 minutes: medications; therapy; dietary    Synthia Innocent NP [facility]  call [phone]

## 2024-02-21 ENCOUNTER — Other Ambulatory Visit: Payer: Self-pay | Admitting: *Deleted

## 2024-02-21 NOTE — Patient Outreach (Signed)
 Mr. Brosh resides in [facility]. Screening for potential complex care management services as benefit of health plan and primary care provider.  Update received from [place], SNF social worker. Mr. Kissoon is long term resident at [facility].  No identifiable care management needs at this time.   Raiford Noble, MSN, RN, BSN [place]  [facility], [facility] RN Post- Acute Care Manager Direct Dial: [phone]

## 2024-02-28 ENCOUNTER — Other Ambulatory Visit (HOSPITAL_COMMUNITY)
Admission: RE | Admit: 2024-02-28 | Discharge: 2024-02-28 | Disposition: A | Discharge status: Home / Self Care | Source: Skilled Nursing Facility | Attending: Adult Health | Admitting: Adult Health

## 2024-02-28 DIAGNOSIS — J111 Influenza due to unidentified influenza virus with other respiratory manifestations: Secondary | ICD-10-CM | POA: Insufficient documentation

## 2024-02-28 DIAGNOSIS — R0603 Acute respiratory distress: Secondary | ICD-10-CM | POA: Insufficient documentation

## 2024-02-28 DIAGNOSIS — J9811 Atelectasis: Secondary | ICD-10-CM | POA: Diagnosis not present

## 2024-02-28 DIAGNOSIS — R918 Other nonspecific abnormal finding of lung field: Secondary | ICD-10-CM | POA: Diagnosis not present

## 2024-02-28 LAB — RESP PANEL BY RT-PCR (RSV, FLU A&B, COVID)  RVPGX2
Influenza A by PCR: NEGATIVE
Influenza B by PCR: NEGATIVE
Resp Syncytial Virus by PCR: NEGATIVE
SARS Coronavirus 2 by RT PCR: NEGATIVE

## 2024-02-28 LAB — CBC
HCT: 31.7 % — ABNORMAL LOW (ref 39.0–52.0)
Hemoglobin: 9.9 g/dL — ABNORMAL LOW (ref 13.0–17.0)
MCH: 32 pg (ref 26.0–34.0)
MCHC: 31.2 g/dL (ref 30.0–36.0)
MCV: 102.6 fL — ABNORMAL HIGH (ref 80.0–100.0)
Platelets: 132 10*3/uL — ABNORMAL LOW (ref 150–400)
RBC: 3.09 MIL/uL — ABNORMAL LOW (ref 4.22–5.81)
RDW: 17.7 % — ABNORMAL HIGH (ref 11.5–15.5)
WBC: 5.5 10*3/uL (ref 4.0–10.5)
nRBC: 0 % (ref 0.0–0.2)

## 2024-02-28 LAB — BASIC METABOLIC PANEL
Anion gap: 8 (ref 5–15)
BUN: 33 mg/dL — ABNORMAL HIGH (ref 8–23)
CO2: 21 mmol/L — ABNORMAL LOW (ref 22–32)
Calcium: 8.4 mg/dL — ABNORMAL LOW (ref 8.9–10.3)
Chloride: 109 mmol/L (ref 98–111)
Creatinine, Ser: 1.22 mg/dL (ref 0.61–1.24)
GFR, Estimated: 54 mL/min — ABNORMAL LOW (ref >60)
Glucose, Bld: 88 mg/dL (ref 70–99)
Potassium: 4.1 mmol/L (ref 3.5–5.1)
Sodium: 138 mmol/L (ref 135–145)

## 2024-02-28 LAB — MAGNESIUM: Magnesium: 2 mg/dL (ref 1.7–2.4)

## 2024-03-02 ENCOUNTER — Encounter: Payer: Self-pay | Admitting: Adult Health

## 2024-03-02 ENCOUNTER — Non-Acute Institutional Stay (SKILLED_NURSING_FACILITY): Payer: Self-pay | Admitting: Internal Medicine

## 2024-03-02 DIAGNOSIS — J181 Lobar pneumonia, unspecified organism: Secondary | ICD-10-CM | POA: Diagnosis not present

## 2024-03-02 DIAGNOSIS — N1831 Chronic kidney disease, stage 3a: Secondary | ICD-10-CM | POA: Diagnosis not present

## 2024-03-02 DIAGNOSIS — F0150 Vascular dementia without behavioral disturbance: Secondary | ICD-10-CM

## 2024-03-02 HISTORY — DX: Lobar pneumonia, unspecified organism: J18.1

## 2024-03-02 NOTE — Progress Notes (Signed)
 NURSING HOME LOCATION:  [facility] ROOM NUMBER:  [id number]  CODE STATUS: DNR   PCP: Sharee Holster, NP   This is a nursing facility follow up visit for specific acute issue of HCAP.Marland Kitchen  Interim medical record and care since last SNF visit was updated with review of diagnostic studies and change in clinical status since last visit were documented.  HPI: Staff reports abnormal breath sounds  despite being on oral antibiotics & nebulized bronchodilators. Over the 4/1-03/30/2024 weekend his O2 sats were noted to be in the low 90s and supplemental oxygen was prescribed.  On Monday 3/3 chest x-ray was performed with a reported right upper lobe pneumonia.  Oral Doxycycline twice daily was initiated.  Labs performed on that day revealed improvement in CKD with a current creatinine of 1.22 and GFR 54 indicating CKD stage IIIa.  Previous GFRs have been in the range of 19-43.  White count was normal at 5500; anemia had improved with H/H of 9.9/31.7.  Macrocytosis was noted.  Mild thrombocytopenia was present with a platelet count of 132,000. [facility] NP had planed to see Caleb [name] on 3/4; but she had to leave the facility because of COVID-19 positive screening test. I was alerted today of staff concerns about his abnormal breath sounds.  I personally reviewed the chest x-ray.  Indeed right upper lobe pneumonia is suggested as well as bilateral atelectasis or possible infiltrate in the left lower lobe versus atelectasis.  These changes are superimposed on marked interstitial nodular/reticular changes diffusely.  Also the right inferior hilum is full, suggesting a mass effect.  Borderline cardiomegaly is present without hilar vascular accentuation to suggest CHF.  Review of systems: Neurocognitive deficits compromised completion of review of systems.  Caleb [name] frankly confabulated at times.  His first query was "who was that doctor in my room last night."  Caleb [name] stated that individual stood in the corner of the room  and did not interact verbally.  Caleb [name] referred to Caleb [name] as "Dr. Jodelle Green."   Caleb [name] describes an occasional nonproductive cough.  Caleb [name] also has a minor sore throat.  Despite this his nurse described thick yellow sputum which was blood-tinged.  Caleb [name] categorically denied this.  Caleb [name] also stated that Caleb [name] was not receiving his nebulizer treatments even though his nurse verified such.  Caleb [name] previously had been on parenteral Rocephin but this was stopped because of vehement complaints of pain @ the injection site. Caleb [name] did slip out of bed last night but the near fall was visualized.  No injury was documented.  Physical exam:  Pertinent or positive findings: Heart sounds are markedly distant and irregular with slight tachycardia.  Caleb [name] has markedly coarse rhonchi greater on the right than the left.  Abdomen is protuberant.  The lower extremities are wrapped but the right lower extremity is visibly larger than the left.  Pedal pulses are not palpable.  Ecchymosis is present over the dorsum of the hands.  Strength to opposition is fair.  There is interosseous wasting.  General appearance: no acute distress, increased work of breathing is present.   Lymphatic: No lymphadenopathy about the head, neck, axilla. Eyes: No conjunctival inflammation or lid edema is present. There is no scleral icterus. Ears:  External ear exam shows no significant lesions or deformities.   Nose:  External nasal examination shows no deformity or inflammation. Nasal mucosa are pink and moist without lesions, exudates Oral exam:  Lips and gums are healthy appearing. There is no oropharyngeal erythema or exudate. Neck:  No thyromegaly, masses, tenderness noted.    Heart:  No murmur, click, rub .  Lungs:  without wheezes,  rales, rubs. Abdomen: Bowel sounds are normal. Abdomen is soft and nontender with no organomegaly, hernias, masses. GU: Deferred  Extremities:  No cyanosis, clubbing  Neurologic exam :Balance, Rhomberg, finger to nose testing could not be  completed due to clinical state Skin: Warm & dry w/o tenting. No significant visible lesions or rash; but BLE wrapping not removed for exam.  See summary under each active problem in the Problem List with associated updated therapeutic plan

## 2024-03-02 NOTE — Patient Instructions (Signed)
 See assessment and plan under each diagnosis in the problem list and acutely for this visit X-ray findings  and therapeutic plan discussed with his son.

## 2024-03-02 NOTE — Assessment & Plan Note (Addendum)
 02/28/2024 improvement in CKD with current creatinine 1.22 and GFR 54 indicating CKD stage IIIa.  Prior GFR range of 19-43.  Med list reviewed; no indication for change in meds or dosages.

## 2024-03-02 NOTE — Assessment & Plan Note (Addendum)
 03/02/2024 he confabulates about an imaginary physician being in his  room the evening with 3/5.  He also denied receiving his nebulizer treatments or producing purulent blood-tinged sputum.

## 2024-03-02 NOTE — Assessment & Plan Note (Addendum)
 Doxycycline will be discontinued and Augmentin initiated along with short burst of steroids to treat the marked rhonchi noted on examination.  Situation discussed with his son; prognosis is poor.

## 2024-03-03 ENCOUNTER — Encounter: Payer: Self-pay | Admitting: Internal Medicine

## 2024-03-09 ENCOUNTER — Encounter: Payer: Self-pay | Admitting: Adult Health

## 2024-03-09 ENCOUNTER — Non-Acute Institutional Stay (SKILLED_NURSING_FACILITY): Payer: Self-pay | Admitting: Adult Health

## 2024-03-09 DIAGNOSIS — R339 Retention of urine, unspecified: Secondary | ICD-10-CM

## 2024-03-09 DIAGNOSIS — R4189 Other symptoms and signs involving cognitive functions and awareness: Secondary | ICD-10-CM | POA: Diagnosis not present

## 2024-03-09 DIAGNOSIS — N1831 Chronic kidney disease, stage 3a: Secondary | ICD-10-CM

## 2024-03-09 DIAGNOSIS — F0150 Vascular dementia without behavioral disturbance: Secondary | ICD-10-CM

## 2024-03-09 DIAGNOSIS — R29818 Other symptoms and signs involving the nervous system: Secondary | ICD-10-CM | POA: Diagnosis not present

## 2024-03-09 DIAGNOSIS — Z8549 Personal history of malignant neoplasm of other male genital organs: Secondary | ICD-10-CM | POA: Diagnosis not present

## 2024-03-09 DIAGNOSIS — N319 Neuromuscular dysfunction of bladder, unspecified: Secondary | ICD-10-CM

## 2024-03-09 NOTE — Progress Notes (Signed)
 Location:  [facility] Room Number: 139 Place of Service:  SNF (31)   CODE STATUS: dnr   No Known Allergies  Chief Complaint  Patient presents with   Medical Management of Chronic Issues         Neurocognitive deficits/vascular dementia without behavioral disturbance Stage 3b chronic  kidney disease (CKD):  History of penile cancer/neurogenic bladder     HPI:  He is a 89 year old long term resident of this facility being seen for the management of his chronic illnesses: Neurocognitive deficits/vascular dementia without behavioral disturbance Stage 3b chronic  kidney disease (CKD):  History of penile cancer/neurogenic bladder. There are no reports of uncontrolled pain. His weight is without significant change. He continues with long term foley.   Past Medical History:  Diagnosis Date   Coronary artery disease    Hyperlipidemia    Obesity    Penile cancer (HCC)    Swelling of extremity    Left Leg    Past Surgical History:  Procedure Laterality Date   APPENDECTOMY     BACK SURGERY     BIOPSY  07/22/2021   Procedure: BIOPSY;  Surgeon: Lynann Bologna, MD;  Location: [facility] ENDOSCOPY;  Service: Gastroenterology;;   CHOLECYSTECTOMY     CORONARY ANGIOPLASTY  11/2005   RCA PCI AND STENTING WITH A CYPHER DES   CORONARY ARTERY BYPASS GRAFT     ESOPHAGOGASTRODUODENOSCOPY (EGD) WITH PROPOFOL N/A 07/22/2021   Procedure: ESOPHAGOGASTRODUODENOSCOPY (EGD) WITH PROPOFOL;  Surgeon: Lynann Bologna, MD;  Location: [facility] ENDOSCOPY;  Service: Gastroenterology;  Laterality: N/A;   HIATAL HERNIA REPAIR     lymph node removal     NM MYOCAR MULTIPLE W/SPECT  11/26/2009   EF 62%. NORMAL MYOCARDIAL PERFUSION STUDY.   TRANSTHORACIC ECHOCARDIOGRAM  12/22/2005   MILD TO MOD AORTIC SCLEROSIS W/O STENOSIS. MILD TO MODERATE MITRAL CALCIFICATION. LA- MILDLY DILATED.    Social History   Socioeconomic History   Marital status: Divorced    Spouse name: Not on file   Number of  children: Not on file   Years of education: Not on file   Highest education level: Not on file  Occupational History   Occupation: retired    Associate Professor: RETIRED  Tobacco Use   Smoking status: Former    Current packs/day: 0.00    Types: Cigarettes    Quit date: 07/14/1983    Years since quitting: 40.6   Smokeless tobacco: Never  Vaping Use   Vaping status: Never Used  Substance and Sexual Activity   Alcohol use: No   Drug use: No   Sexual activity: Not on file  Other Topics Concern   Not on file  Social History Narrative   Not on file   Social Drivers of Health   Financial Resource Strain: Not on file  Food Insecurity: No Food Insecurity (02/01/2024)   Hunger Vital Sign    Worried About Running Out of Food in the Last Year: Never true    Ran Out of Food in the Last Year: Never true  Transportation Needs: No Transportation Needs (02/01/2024)   PRAPARE - Administrator, Civil Service (Medical): No    Lack of Transportation (Non-Medical): No  Physical Activity: Not on file  Stress: Not on file  Social Connections: Unknown (02/03/2024)   Social Connection and Isolation Panel [NHANES]    Frequency of Communication with Friends and Family: Patient declined    Frequency of Social Gatherings with Friends and Family: Patient  declined    Attends Religious Services: Not on file    Active Member of Clubs or Organizations: Patient declined    Attends Banker Meetings: Not on file    Marital Status: Patient declined  Intimate Partner Violence: Not At Risk (02/01/2024)   Humiliation, Afraid, Rape, and Kick questionnaire    Fear of Current or Ex-Partner: No    Emotionally Abused: No    Physically Abused: No    Sexually Abused: No   Family History  Problem Relation Age of Onset   Cancer Father    Cancer Sister    Cancer Brother    Cancer Sister    Cancer Other       VITAL SIGNS BP 136/70   Pulse 78   Temp (!) 97.1 F (36.2 C)   Resp 20   Ht 5' 10.5"  (1.791 m)   Wt 251 lb 9.6 oz (114.1 kg)   SpO2 100%   BMI 35.59 kg/m   Outpatient Encounter Medications as of 03/09/2024  Medication Sig   acetaminophen (TYLENOL) 325 MG tablet Take 650 mg by mouth every 6 (six) hours as needed for fever or moderate pain (pain score 4-6).   Balsam [place]-Castor Oil (VENELEX) OINT Apply topically as needed.   diclofenac Sodium (VOLTAREN ARTHRITIS PAIN) 1 % GEL Apply topically 3 (three) times daily.   ELIQUIS 2.5 MG TABS tablet Take 2.5 mg by mouth 2 (two) times daily.   metoprolol tartrate 37.5 MG TABS Take 1 tablet (37.5 mg total) by mouth 2 (two) times daily.   miconazole (MICOTIN) 2 % powder Apply topically in the morning and at bedtime.   Multiple Vitamins-Minerals (PRESERVISION AREDS 2) CAPS Take 1 tablet by mouth daily.   sennosides-docusate sodium (SENOKOT-S) 8.6-50 MG tablet Take 1 tablet by mouth daily.   sertraline (ZOLOFT) 50 MG tablet Take 50 mg by mouth daily.   sodium chloride 1 g tablet Take 1 tablet (1 g total) by mouth 2 (two) times daily with a meal.   No facility-administered encounter medications on file as of 03/09/2024.     SIGNIFICANT DIAGNOSTIC EXAMS   08-29-23: wbc 7.8; hgb 11.0; hct 32.0; mcv 94.1 plt 184; glucose 127; bun 40; creat 1.67; k+ 2.7; na++ 122; ca 8.8; gfr 37; protein 6.5 albumin 3.8; mag 3.8; tsh 2.133 09-01-23: glucose 120; bun 43; creat 1.97; k+ 3.9; na++ 132; ca 8.9; gfr 31 09-09-23: glucose 86; bun 37; creat 1.52; k+ 4.5; na++ 137; ca 8.8; gfr 42 09-23-23: d-dimer: 0.65 10-12-23: wbc 6.7; hgb 10.2; hct 32.5; mcv 102.2 plt 171; glucose 84; bun 26; creat 1.45; k+ 4.7; na++ 136; ca 9.0; gfr 44; d-dimer 0.71   12-24-23: glucose 117; bun 34; creat 1.56; k+ 4.2; na++ 137; ca 9.3 gfr 40 12-30-23: glucose 79; bun 28; creat 1.45; k+ 4.2; na++ 137; ca 9.0; gfr 44 01-31-24: wbc 19.3; hgb 11.3; hct 35.8; mcv 100.6 plt 152; glucose 129 bun 38; creat 1.96; k+ 4.9; na++ 136; ca 9.0 gfr 31 blood culture: group B strep 02-02-24: wbc 139.;  hgb 8.9; hct 28.4; mcv 99.6 plt 117; glucose 103; bun 45; creat 1.62; k+ 4.2; na++ 135; ca 8.0; gfr 38; ast 114 alt 109; protein 5.5 albumin 2.5 02-05-24: wbc 8.6; hgb 9.2; hct 28.8; mcv 98.0 plt 180; glucose 92; bun 39; creat 1.48; k+ 4.3; na++ 135; ca 8.4 gfr 43 ast 50 alt 78; alk phos 140 protein 5.4 albumin 2.2   TODAY  02-28-24: wbc 5.5; hgb 9.9; hct  31.7; mcv 102.6 plt 132; glucose 88; bun 33; creat 1.22; k+ 4.1; na++ 138; ca 8.4; gfr 54; mag 2.0   Review of Systems  Constitutional:  Negative for malaise/fatigue.  Respiratory:  Negative for cough and shortness of breath.   Cardiovascular:  Positive for leg swelling. Negative for chest pain and palpitations.  Gastrointestinal:  Negative for abdominal pain, constipation and heartburn.  Musculoskeletal:  Negative for back pain, joint pain and myalgias.  Skin: Negative.   Neurological:  Negative for dizziness.  Psychiatric/Behavioral:  The patient is not nervous/anxious.    Physical Exam Constitutional:      General: He is not in acute distress.    Appearance: He is well-developed. He is obese. He is not diaphoretic.  Neck:     Thyroid: No thyromegaly.  Cardiovascular:     Rate and Rhythm: Normal rate and regular rhythm.     Heart sounds: Normal heart sounds.  Pulmonary:     Effort: Pulmonary effort is normal. No respiratory distress.     Breath sounds: Normal breath sounds.  Abdominal:     General: Bowel sounds are normal. There is no distension.     Palpations: Abdomen is soft.     Tenderness: There is no abdominal tenderness.  Genitourinary:    Comments: foley Musculoskeletal:        General: Normal range of motion.     Cervical back: Neck supple.     Right lower leg: Edema present.     Left lower leg: Edema present.  Lymphadenopathy:     Cervical: No cervical adenopathy.  Skin:    General: Skin is warm and dry.  Neurological:     Mental Status: He is alert. Mental status is at baseline.  Psychiatric:        Mood and  Affect: Mood normal.     ASSESSMENT/ PLAN:  TODAY  Neurocognitive deficits/vascular dementia without behavioral disturbance: CT 08-29-23 demonstrates: atrophy with chronic small vessel white matter ischemic disease; SLUMS 8/30.   2. Stage 3b chronic  kidney disease (CKD): bun 39; creat 1.48 gfr 43  3. History of penile cancer/neurogenic bladder: has long term foley.   PREVIOUS   4. Chronic constipation: will continue senna s daily   5. Coronary artery disease involving native coronary artery of native heart without angina pectoris.   6.  Aortic calcification: (Ct 02-17-16)   7. SIADH (syndrome of inappropriate ADH production)/hyponatremia na++ 135; is off NACL   8. Paroxsymal atrial fibrillation: heart rate is stable lopressor 37.5 mg twice daily and eliquis 2.5 mg twice daily   9. Peripheral vascular disease/critical ischemic lower extremities: will monitor   Synthia Innocent NP [facility]  call [phone]

## 2024-03-22 ENCOUNTER — Encounter: Payer: Self-pay | Admitting: Internal Medicine

## 2024-03-22 ENCOUNTER — Non-Acute Institutional Stay (SKILLED_NURSING_FACILITY): Payer: Self-pay | Admitting: Internal Medicine

## 2024-03-22 ENCOUNTER — Other Ambulatory Visit (HOSPITAL_COMMUNITY)
Admission: RE | Admit: 2024-03-22 | Discharge: 2024-03-22 | Disposition: A | Discharge status: Home / Self Care | Source: Skilled Nursing Facility | Attending: Internal Medicine | Admitting: Internal Medicine

## 2024-03-22 DIAGNOSIS — H1032 Unspecified acute conjunctivitis, left eye: Secondary | ICD-10-CM | POA: Diagnosis not present

## 2024-03-22 DIAGNOSIS — D539 Nutritional anemia, unspecified: Secondary | ICD-10-CM

## 2024-03-22 DIAGNOSIS — E871 Hypo-osmolality and hyponatremia: Secondary | ICD-10-CM | POA: Insufficient documentation

## 2024-03-22 DIAGNOSIS — R443 Hallucinations, unspecified: Secondary | ICD-10-CM | POA: Insufficient documentation

## 2024-03-22 LAB — COMPREHENSIVE METABOLIC PANEL
ALT: 21 U/L (ref 0–44)
AST: 20 U/L (ref 15–41)
Albumin: 3.2 g/dL — ABNORMAL LOW (ref 3.5–5.0)
Alkaline Phosphatase: 55 U/L (ref 38–126)
Anion gap: 7 (ref 5–15)
BUN: 26 mg/dL — ABNORMAL HIGH (ref 8–23)
CO2: 26 mmol/L (ref 22–32)
Calcium: 9.2 mg/dL (ref 8.9–10.3)
Chloride: 106 mmol/L (ref 98–111)
Creatinine, Ser: 1.03 mg/dL (ref 0.61–1.24)
GFR, Estimated: >60 mL/min (ref >60)
Glucose, Bld: 84 mg/dL (ref 70–99)
Potassium: 4.4 mmol/L (ref 3.5–5.1)
Sodium: 139 mmol/L (ref 135–145)
Total Bilirubin: 0.7 mg/dL (ref 0.0–1.2)
Total Protein: 6.2 g/dL — ABNORMAL LOW (ref 6.5–8.1)

## 2024-03-22 NOTE — Patient Instructions (Signed)
 See assessment and plan under each diagnosis in the problem list and acutely for this visit

## 2024-03-22 NOTE — Progress Notes (Addendum)
 NURSING HOME LOCATION:  [facility] ROOM NUMBER: [id number]   CODE STATUS:  DNR  PCP: Sharee Holster, NP   This is a nursing facility follow up visit for specific acute issue of hallucinations.  Interim medical record and care since last SNF visit was updated with review of diagnostic studies and change in clinical status since last visit were documented.  HPI: Staff reports he is having increasing hallucinations.  He tends to grab at the air as is there is an object to retrieve.  He is also noted to be talking to his son when his son was not at the facility.  This has been an intermittent phenomena as at my last exam he had inquired about a doctor in his room who alledgedly stood in the corner and did not interact. Most recent labs were performed 02/28/2024 and revealed mild prerenal azotemia with a BUN of 33, down from a peak of 56.  Mild hypocalcemia was present with a value of 8.4.  In early February he had demonstrated elevated liver function tests with AST peaking at 227; the most recent value was 50 on 2/8. Peak ALT was 109 with the most recent value of 78 on 2/8.   Protein/caloric malnutrition was present with an albumin of 2.2 and total protein of 5.2.  His CKD has improved serially dating from February with the most recent GFR of 54 and creatinine 1.22 indicating CKD stage IIIa.  Nadir GFR had been 19 on 2/4 indicating stage IV kidney disease. Anemia has been relatively stable with most recent values of 9.9/31.7 . On 3/3 his MCV was 102.6.  There is no B12 level in Epic. The platelet count was mildly reduced at 132,000; nadir value was 117,000.  Review of systems: Dementia invalidated responses.  He denies any active symptoms.  Responses to the queries was a curt "no" except for complains of intermittent diarrhea.  When I asked if the stool was loose or watery his response was "weak."  Constitutional: No fever, significant weight change  Eyes: No redness, discharge, pain,  vision change ENT/mouth: No nasal congestion,  purulent discharge, earache, change in hearing, sore throat  Cardiovascular: No chest pain, palpitations, paroxysmal nocturnal dyspnea  Respiratory: No cough, sputum production, hemoptysis, DOE, significant snoring, apnea   Gastrointestinal: No heartburn, dysphagia, abdominal pain, nausea /vomiting, rectal bleeding, melena Genitourinary: No dysuria, hematuria, pyuria, incontinence, nocturia Musculoskeletal: No joint stiffness, joint swelling, weakness, pain Neurologic: No dizziness, headache, syncope, seizures, numbness, tingling Psychiatric: No significant anxiety, depression, insomnia, anorexia Endocrine: No change in hair/skin/nails, excessive thirst, excessive hunger, excessive urination  Hematologic/lymphatic: No abnormal bleeding Allergy/immunology: No itchy/watery eyes, significant sneezing, urticaria, angioedema  Physical exam:  Pertinent or positive findings: He was initially asleep exhibiting hypopnea without frank apnea or snoring.  Mouth was agape.  He is wearing nasal oxygen.  Upon awakening he remained lethargic.  The left eye was matted and closed.  The lower lid is puffy and faintly erythematous.  He denied any active symptoms of change in vision or purulent drainage from the eye.  He seemed to resist the eye exam somewhat though he denied discomfort.  Breath sounds are decreased.  Heart sounds are markedly distant.  Rate appears to be slow and rhythm irregular.  Bowel sounds are hyperactive.  Pedal pulses are not palpable and feet are cold to touch.  The right lower extremity is visually larger than the left.  He has exfoliative and keratotic changes of the right lower  extremity below the knee with purplish hyperpigmentation.  The left superior ankle area is wrapped.  He has dressings over the heels bilaterally.  He has diffuse irregular bruising over the forearms, most notably of the dorsum of the hands.  Interosseous wasting of the hands  is present.  Toenails are thickened.  He exhibits intermittent myoclonic jerking of the toes.  General appearance: no acute distress, increased work of breathing is present.   Lymphatic: No lymphadenopathy about the head, neck, axilla. Eyes: There is no scleral icterus. Ears:  External ear exam shows no significant lesions or deformities.   Nose:  External nasal examination shows no deformity or inflammation. Nasal mucosa are pink and moist without lesions, exudates Oral exam:  Lips and gums are healthy appearing. There is no oropharyngeal erythema or exudate. Neck:  No thyromegaly, masses, tenderness noted.    Heart:  No gallop, murmur, click, rub .  Lungs:  without wheezes, rhonchi, rales, rubs. Abdomen: Abdomen is soft and nontender with no organomegaly, hernias, masses. GU: Deferred  Extremities:  No cyanosis, clubbing  Neurologic exam :Balance, Rhomberg, finger to nose testing could not be completed due to clinical state  See summary under each active problem in the Problem List with associated updated therapeutic plan

## 2024-03-22 NOTE — Assessment & Plan Note (Addendum)
 Repeat CMP. Empiric B12 2000 mcg every day X 1 month then 1 every day.B12 level and ammonia level can be considered based if mental status changes progress.

## 2024-03-22 NOTE — Assessment & Plan Note (Signed)
 Because of increasing hallucinations and recurrent macrocytosis; empiric B12 supplementation will be in the form of 2000 mcg daily x 1 month and 1 daily thereafter.  If mental status changes persist or progress; B12 level can be checked with an ammonia level.

## 2024-03-28 DIAGNOSIS — Z23 Encounter for immunization: Secondary | ICD-10-CM | POA: Diagnosis not present

## 2024-03-30 ENCOUNTER — Encounter: Payer: Self-pay | Admitting: Adult Health

## 2024-03-30 ENCOUNTER — Non-Acute Institutional Stay (SKILLED_NURSING_FACILITY): Payer: Self-pay | Admitting: Adult Health

## 2024-03-30 DIAGNOSIS — R634 Abnormal weight loss: Secondary | ICD-10-CM

## 2024-03-30 DIAGNOSIS — F0150 Vascular dementia without behavioral disturbance: Secondary | ICD-10-CM

## 2024-03-30 DIAGNOSIS — F419 Anxiety disorder, unspecified: Secondary | ICD-10-CM

## 2024-03-30 NOTE — Progress Notes (Signed)
 Location:  [facility] Room Number: [id number] Place of Service:  SNF ([id number])   CODE STATUS: dnr   No Known Allergies  Chief Complaint  Patient presents with   Acute Visit    Weight loss     HPI:  He is losing weight. He is down 17 pounds in 6 months with 11 pound weight loss in the past month his current weight is 235 pounds. He has been having increased anxiety. He is calling out in the middle of the night wanting to go to the ED for a foley change. He gets focused and cannot be side tracked. He does spend all of his time in bed; per his choice.   Past Medical History:  Diagnosis Date   Coronary artery disease    Hyperlipidemia    Obesity    Penile cancer (HCC)    Swelling of extremity    Left Leg    Past Surgical History:  Procedure Laterality Date   APPENDECTOMY     BACK SURGERY     BIOPSY  07/22/2021   Procedure: BIOPSY;  Surgeon: Lynann Bologna, MD;  Location: [facility] ENDOSCOPY;  Service: Gastroenterology;;   CHOLECYSTECTOMY     CORONARY ANGIOPLASTY  11/2005   RCA PCI AND STENTING WITH A CYPHER DES   CORONARY ARTERY BYPASS GRAFT     ESOPHAGOGASTRODUODENOSCOPY (EGD) WITH PROPOFOL N/A 07/22/2021   Procedure: ESOPHAGOGASTRODUODENOSCOPY (EGD) WITH PROPOFOL;  Surgeon: Lynann Bologna, MD;  Location: [facility] ENDOSCOPY;  Service: Gastroenterology;  Laterality: N/A;   HIATAL HERNIA REPAIR     lymph node removal     NM MYOCAR MULTIPLE W/SPECT  11/26/2009   EF 62%. NORMAL MYOCARDIAL PERFUSION STUDY.   TRANSTHORACIC ECHOCARDIOGRAM  12/22/2005   MILD TO MOD AORTIC SCLEROSIS W/O STENOSIS. MILD TO MODERATE MITRAL CALCIFICATION. LA- MILDLY DILATED.    Social History   Socioeconomic History   Marital status: Divorced    Spouse name: Not on file   Number of children: Not on file   Years of education: Not on file   Highest education level: Not on file  Occupational History   Occupation: retired    Associate Professor: RETIRED  Tobacco Use   Smoking status: Former    Current  packs/day: 0.00    Types: Cigarettes    Quit date: 07/14/1983    Years since quitting: 40.7   Smokeless tobacco: Never  Vaping Use   Vaping status: Never Used  Substance and Sexual Activity   Alcohol use: No   Drug use: No   Sexual activity: Not on file  Other Topics Concern   Not on file  Social History Narrative   Not on file   Social Drivers of Health   Financial Resource Strain: Not on file  Food Insecurity: No Food Insecurity (02/01/2024)   Hunger Vital Sign    Worried About Running Out of Food in the Last Year: Never true    Ran Out of Food in the Last Year: Never true  Transportation Needs: No Transportation Needs (02/01/2024)   PRAPARE - Administrator, Civil Service (Medical): No    Lack of Transportation (Non-Medical): No  Physical Activity: Not on file  Stress: Not on file  Social Connections: Unknown (02/03/2024)   Social Connection and Isolation Panel [NHANES]    Frequency of Communication with Friends and Family: Patient declined    Frequency of Social Gatherings with Friends and Family: Patient declined    Attends Religious Services: Not on file  Active Member of Clubs or Organizations: Patient declined    Attends Banker Meetings: Not on file    Marital Status: Patient declined  Intimate Partner Violence: Not At Risk (02/01/2024)   Humiliation, Afraid, Rape, and Kick questionnaire    Fear of Current or Ex-Partner: No    Emotionally Abused: No    Physically Abused: No    Sexually Abused: No   Family History  Problem Relation Age of Onset   Cancer Father    Cancer Sister    Cancer Brother    Cancer Sister    Cancer Other       VITAL SIGNS BP 135/74   Pulse 74   Temp (!) 96.6 F (35.9 C)   Resp 20   Ht 5\' 10"  (1.778 m)   Wt 235 lb (106.6 kg)   SpO2 96%   BMI 33.72 kg/m   Outpatient Encounter Medications as of 03/30/2024  Medication Sig   acetaminophen (TYLENOL) 325 MG tablet Take 650 mg by mouth every 6 (six) hours as  needed for fever or moderate pain (pain score 4-6).   Balsam [place]-Castor Oil (VENELEX) OINT Apply topically as needed.   diclofenac Sodium (VOLTAREN ARTHRITIS PAIN) 1 % GEL Apply topically 3 (three) times daily.   ELIQUIS 2.5 MG TABS tablet Take 2.5 mg by mouth 2 (two) times daily.   metoprolol tartrate 37.5 MG TABS Take 1 tablet (37.5 mg total) by mouth 2 (two) times daily.   miconazole (MICOTIN) 2 % powder Apply topically in the morning and at bedtime.   Multiple Vitamins-Minerals (PRESERVISION AREDS 2) CAPS Take 1 tablet by mouth daily.   sennosides-docusate sodium (SENOKOT-S) 8.6-50 MG tablet Take 1 tablet by mouth daily.   sertraline (ZOLOFT) 50 MG tablet Take 50 mg by mouth daily.   sodium chloride 1 g tablet Take 1 tablet (1 g total) by mouth 2 (two) times daily with a meal.   No facility-administered encounter medications on file as of 03/30/2024.     SIGNIFICANT DIAGNOSTIC EXAMS  08-29-23: wbc 7.8; hgb 11.0; hct 32.0; mcv 94.1 plt 184; glucose 127; bun 40; creat 1.67; k+ 2.7; na++ 122; ca 8.8; gfr 37; protein 6.5 albumin 3.8; mag 3.8; tsh 2.133 09-01-23: glucose 120; bun 43; creat 1.97; k+ 3.9; na++ 132; ca 8.9; gfr 31 09-09-23: glucose 86; bun 37; creat 1.52; k+ 4.5; na++ 137; ca 8.8; gfr 42 09-23-23: d-dimer: 0.65 10-12-23: wbc 6.7; hgb 10.2; hct 32.5; mcv 102.2 plt 171; glucose 84; bun 26; creat 1.45; k+ 4.7; na++ 136; ca 9.0; gfr 44; d-dimer 0.71   12-24-23: glucose 117; bun 34; creat 1.56; k+ 4.2; na++ 137; ca 9.3 gfr 40 12-30-23: glucose 79; bun 28; creat 1.45; k+ 4.2; na++ 137; ca 9.0; gfr 44 01-31-24: wbc 19.3; hgb 11.3; hct 35.8; mcv 100.6 plt 152; glucose 129 bun 38; creat 1.96; k+ 4.9; na++ 136; ca 9.0 gfr 31 blood culture: group B strep 02-02-24: wbc 139.; hgb 8.9; hct 28.4; mcv 99.6 plt 117; glucose 103; bun 45; creat 1.62; k+ 4.2; na++ 135; ca 8.0; gfr 38; ast 114 alt 109; protein 5.5 albumin 2.5 02-05-24: wbc 8.6; hgb 9.2; hct 28.8; mcv 98.0 plt 180; glucose 92; bun 39; creat 1.48;  k+ 4.3; na++ 135; ca 8.4 gfr 43 ast 50 alt 78; alk phos 140 protein 5.4 albumin 2.2   TODAY  02-28-24: wbc 5.5; hgb 9.9; hct 31.7; mcv 102.6 plt 132; glucose 88; bun 33; creat 1.22; k+ 4.1; na++ 138; ca  8.4; gfr 54; mag 2.0   Review of Systems  Constitutional:  Negative for malaise/fatigue.  Respiratory:  Negative for cough and shortness of breath.   Cardiovascular:  Negative for chest pain, palpitations and leg swelling.  Gastrointestinal:  Negative for abdominal pain, constipation and heartburn.  Musculoskeletal:  Negative for back pain, joint pain and myalgias.  Skin: Negative.   Neurological:  Negative for dizziness.  Psychiatric/Behavioral:  The patient is not nervous/anxious.    Physical Exam Constitutional:      General: He is not in acute distress.    Appearance: He is well-developed. He is obese. He is not diaphoretic.  Neck:     Thyroid: No thyromegaly.  Cardiovascular:     Rate and Rhythm: Normal rate and regular rhythm.     Pulses: Normal pulses.     Heart sounds: Normal heart sounds.  Pulmonary:     Effort: Pulmonary effort is normal. No respiratory distress.     Breath sounds: Normal breath sounds.  Abdominal:     General: Bowel sounds are normal. There is no distension.     Palpations: Abdomen is soft.     Tenderness: There is no abdominal tenderness.  Genitourinary:    Comments: foley Musculoskeletal:        General: Normal range of motion.     Cervical back: Neck supple.     Right lower leg: No edema.     Left lower leg: No edema.  Lymphadenopathy:     Cervical: No cervical adenopathy.  Skin:    General: Skin is warm and dry.  Neurological:     Mental Status: He is alert. Mental status is at baseline.  Psychiatric:        Mood and Affect: Mood normal.      ASSESSMENT/ PLAN:  TODAY  Vascular dementia without behavioral disturbance Anxiety Weight loss  Will start buspar 5 mg three times daily  Will start ensure twice daily  And will check bnp     Synthia Innocent NP [facility]  call [phone]

## 2024-04-03 ENCOUNTER — Non-Acute Institutional Stay (SKILLED_NURSING_FACILITY): Admitting: Family Medicine

## 2024-04-03 ENCOUNTER — Other Ambulatory Visit (HOSPITAL_COMMUNITY)
Admission: RE | Admit: 2024-04-03 | Discharge: 2024-04-03 | Disposition: A | Discharge status: Home / Self Care | Source: Skilled Nursing Facility | Attending: Adult Health | Admitting: Adult Health

## 2024-04-03 DIAGNOSIS — N1832 Chronic kidney disease, stage 3b: Secondary | ICD-10-CM | POA: Insufficient documentation

## 2024-04-03 DIAGNOSIS — R634 Abnormal weight loss: Secondary | ICD-10-CM | POA: Insufficient documentation

## 2024-04-03 DIAGNOSIS — F419 Anxiety disorder, unspecified: Secondary | ICD-10-CM | POA: Insufficient documentation

## 2024-04-03 DIAGNOSIS — D539 Nutritional anemia, unspecified: Secondary | ICD-10-CM

## 2024-04-03 DIAGNOSIS — I509 Heart failure, unspecified: Secondary | ICD-10-CM | POA: Diagnosis not present

## 2024-04-03 HISTORY — DX: Anxiety disorder, unspecified: F41.9

## 2024-04-03 HISTORY — DX: Abnormal weight loss: R63.4

## 2024-04-03 LAB — BRAIN NATRIURETIC PEPTIDE: B Natriuretic Peptide: 451 pg/mL — ABNORMAL HIGH (ref 0.0–100.0)

## 2024-04-04 ENCOUNTER — Encounter: Payer: Self-pay | Admitting: Family Medicine

## 2024-04-04 NOTE — Addendum Note (Signed)
 Addended by: Sharee Holster on: 04/04/2024 11:01 AM   Modules accepted: Level of Service

## 2024-04-04 NOTE — Progress Notes (Signed)
 Location:  [facility] Room Number: [id number] Place of Service:   [facility]   CODE STATUS: (+)Do Not Resuscitate, (+)No tube feeding- see ACP section EMR  No Known Allergies  Chief Complaint  Patient presents with   Weight Loss    HPI:  Unintentional Weight Loss in Older Adults  Weight 6 month ago 253 pounds, 3 months ago 251 pounds, 1 month ago 251 pounds, 03/30/24 235 pounds 7% weight loss over one month. Patient started on buspirone 5 mg three times a day 04/02/24  Hospitalization 02/01/24 to 02/05/24 for infected left heel pressure ulcer with cellulitis and GBBeta strep sepsis  Diet Type: general;  Thin and Prosource four times a day, Ensure twice a day - added 04/02/24   Average meal intake, recent: ~50%  Fraility: Yes  not getting out of bed  Medication effects (See dysgeusia ADE; Abxs, AED, topiramate, Antipsychotics, benzodiazapines,  Digoxin, levodopa, metformin, opiates, SS/SNRI, theophylline, caffeine): No   Level of physical activity:  bedbound ;  dependent  Polypharmacy (> 4 medications): Yes   Malignancy Hx*: No   Ongoing inflammatory or increased catabolic condition: No   Recent acute Illness: Yes   Emotional problems, especially depression*:  Yes Anxiety/depression, suspicious thinking  Alcoholism / Substance Abuse: No   Paranoia:  suspicious thinkg - accusing son of stealing from him  Swallowing disorders: No   Hyperthyroidism, hypothyroidism, hyperparathyroidism/hypercalcemia, hypoadrenalism:No    GI Disorders Hx*  No GI complaints. (+) History of DAPT-related duodenal ulcers in 2022 with associated weight loss similar to current loss.  History of H/H repair  Nausea and/or Vomiting: No   GI/Biliary surgeries:  Cholecystectomy, appendectomy, hiatal hernia repair  Eating problems (e.g., inability to feed self): No   Low-salt, low-cholesterol diet: No   Cognitive impairment*: Yes   Immunocompromised: No   Diabetes:No    Organ Failure (Cardiac, Respiratory, Renal, Liver): No    Autoimmune Disorders (RA, SLE, etc): No    Neurologic Conditions (Stroke, Parkinsons, Chronic Pain, Dementia): Yes  dementia  Symptoms:  General Fever: No   Cardiovascular Abdominal pain with eating: No  Heart Failure Hx: No   Respiratory Pulmonary Disease Hx: No  Dyspnea:  No  Cough: No  Gastrointestinal History of peptic ulcers:  Yes  Indigestion/heartburn: No  Epigastric Pain: No  Hematemesis: No  Nausea and/or Vomiting: No  Diarrhea: No  Constipation: Yes  Genitourinary Flank pain: No  Suprapubic pain: No   Hematologic Anemia History: Yes  Neuropsychologic Paranoia:  suspciousness of son stealing from him  Cancer Screening History Colorectal Cancer:  no hsitory of colonocsocpy in EMR Prostate Cancer: Followed by [facility]   Major Causes of Unintentional weight loss Malignancy Infectious diseases ( Neurologic diseases- vascular dementia, delusions Anorexia of Aging  .  ===================================================================  Notes Weight loss associated with increase morbidity, mortality and functional decline.  20% patients with unintentional weight loss have no identifiable cause found after investigation. Appetitie naturally diminishes with age related to gastric hormone.  Food intake also deminishes with age due to decrease activity, lowered basal metabolic rate, and loss muscle mass.  If initial evaluation is unremarkable, a three- to six-month observation period with follow up. ===================================================================  Common Treatments - If labs norma are normal, then further workup not necessary.  Follow up in 3 to 6 months.  - Treat underlying reversible or maximizable causes - Treat depression / anxiety - Consult with Nutritionists - Exercise can increase appetite - Consult with PT/OT/ST - Consult with  Medical SW - +/- Nutritional  supplements (provide two hours before or provide after meal) - Flavor enhancement (ham, bacon, roast beef flavors spinkled on cooked food or during food prep - +/- Appetite stimulants - Remove dietary restrictions,  if possible. Allow family to bring in patient's favorite foods and snacks.   - Change food consistency for chewing or swallowing difficulties - Stop possibly causal medications, if possible - Assess Swallow function - Address dental or denture difficulties - Treat infectious processes,  - Treat endocrine conditions - Assistance with feeding - Meals in social setting, more leisurely environment simulating in-home dining - provide multivitamin   Past Medical History:  Diagnosis Date   Acute blood loss anemia 07/19/2021   Acute renal failure superimposed on stage 3b chronic kidney disease (HCC) 02/01/2024   AKI (acute kidney injury) (HCC) 07/31/2021   AKI in context of of GI bleed with severe anemia.  Hemoglobin down to 6.  Status post 7 units of packed red cells.  Creatinine peaked at 2.13 with nadir GFR of 20.  Creatinine 1.7 with GFR 37 prior to discharge.  2/4 - 02/05/2024 hospitalized with strep bacteremia in the context RLE cellulitis.  Creatinine peaked at 2.93  with a nadir GFR of 19.  Final creatinine 1.48 with GFR 43 indicating high sta   Antiplatelet or antithrombotic long-term use    Anxiety 04/03/2024   Aortic calcification (HCC) 09/03/2023   Atrial fibrillation (HCC) 07/17/2021   7/15 - 07/15/2021 hospitalized after a fall; rhabdo related to being on the floor for 6 days.  AF with RVR documented in the hospital.  Rate control achieved with metoprolol.    TSH therapeutic  Not felt to be an anticoagulant candidate due to advanced age, comorbidities, and high risk of falling.  7/23 - 07/28/2021 PAF with RVR with rates in the 130s and hypotension in the context of acute GI bleed.   Cellulitis 05/17/2014   Cellulitis of right lower leg 10/12/2023   See 10/12/2023 irregular  erythema superior to grayish hyperpigmentation over the distal shin.  Pre-existing massive edema of the right calf with 1.5+ pitting.     Cellulitis of right thigh 12/02/2023   Chronic constipation 09/03/2023   Chronic indwelling Foley catheter 09/02/2023   CKD (chronic kidney disease), stage III (HCC) 07/17/2021   At admission with rhabdomyolysis and UTI creatinine 1.39 and GFR 47.  With rehydration at discharge creatinine 1.22 and GFR 55 indicating CKD stage IIIa.     Coronary artery disease    DNR (do not resuscitate) 02/01/2024   Edema of both lower extremities due to peripheral venous insufficiency 09/22/2023   Exudative age-related macular degeneration, right eye, with active choroidal neovascularization (HCC) 08/23/2017   Group B Strep Bacteremia 02/01/2024   History of penectomy 09/02/2023   History of penile cancer 01/09/2015   Hyperlipidemia    Hyponatremia 08/29/2023   9/1 - 09/01/2023 sodium of 122 with reported excessive intake of p.o. fluids.  500 cc normal saline administered with subsequent initiation of sodium tablets. K+ 2.7.The a.m. cortisol normal at 13.2.  Final sodium 132.     Lobar pneumonia (HCC) 03/02/2024   4/1 - 03/30/24 weekend O2 sats were in the low 90s.  Portable chest x-ray 3/3 suggested right upper lobe pneumonia; oral doxycycline initiated.  Abnormal oscillatory sounds documented by staff 3/6 despite nebulizer treatments and antibiotics.  3/3 x-ray personally reviewed.  Right upper lobe pneumonia suggested in addition to a background of marked interstitial nodular/reticular changes diffusely.  Neurogenic bladder 02/26/2016   Formatting of this note might be different from the original.  -TURP(11/05/2008) BPH  -Urodynamics (08/2009) acontractile bladder.   -CIC ~4x/day     Obesity    Penile cancer (HCC)    Peripheral vascular disease, unspecified (HCC) 07/13/2013   Sepsis due to group B Streptococcus (HCC) 02/02/2024   2/4 - 02/05/2024 cellulitis of LLE with  clinical progression with hypotension and tachycardia despite Zosyn via PICC line since 10/3 at [facility].  Cellulitis in context of PVD with critical limb ischemia.  White count 19,300 and lactic acid 4.1.  Vanc, cefepime, metronidazole initiated with IV fluids.  Group B strep on C&S.  Antibiotics transitioned to ceftriaxone with continuation of amoxicillin for 5 a   SIADH (syndrome of inappropriate ADH production) (HCC) 09/03/2023   Stasis edema with ulcer (HCC) 08/17/2013   Status post urethrostomy (HCC) 09/02/2023   Swelling of extremity    Left Leg   Unspecified protein-calorie malnutrition (HCC) 02/16/2024   Urinary retention 03/27/2020   Dr Wilkie Aye, [facility] changes Foley cath monthly     Vascular dementia with behavioral disturbance [facility]) 09/17/2023   Beleifs that family members are stealing from him  08/29/2023 CT of the head revealed atrophy and small vessel white matter ischemic changes suggesting vascular dementia.  07/29/2021 SLUMS MMSE revealed a score of 22 out of 30 indicating minimal neurocognitive deficit.  09/02/2019 4 repeat SLUMS revealed a score of 8 out of 30 compatible with severe dementia.  09/08/2023 BCAT MMSE a score of 24 out of 50   Weight loss 04/03/2024    Past Surgical History:  Procedure Laterality Date   APPENDECTOMY     BACK SURGERY     BIOPSY  07/22/2021   Procedure: BIOPSY;  Surgeon: Lynann Bologna, MD;  Location: [facility] ENDOSCOPY;  Service: Gastroenterology;;   CHOLECYSTECTOMY     CORONARY ANGIOPLASTY  11/2005   RCA PCI AND STENTING WITH A CYPHER DES   CORONARY ARTERY BYPASS GRAFT     ESOPHAGOGASTRODUODENOSCOPY (EGD) WITH PROPOFOL N/A 07/22/2021   Procedure: ESOPHAGOGASTRODUODENOSCOPY (EGD) WITH PROPOFOL;  Surgeon: Lynann Bologna, MD;  Location: [facility] ENDOSCOPY;  Service: Gastroenterology;  Laterality: N/A;   HIATAL HERNIA REPAIR     lymph node removal     NM MYOCAR MULTIPLE W/SPECT  11/26/2009   EF 62%. NORMAL MYOCARDIAL PERFUSION STUDY.   TRANSTHORACIC  ECHOCARDIOGRAM  12/22/2005   MILD TO MOD AORTIC SCLEROSIS W/O STENOSIS. MILD TO MODERATE MITRAL CALCIFICATION. LA- MILDLY DILATED.    Social History   Socioeconomic History   Marital status: Divorced    Spouse name: Not on file   Number of children: Not on file   Years of education: Not on file   Highest education level: Not on file  Occupational History   Occupation: retired    Associate Professor: RETIRED  Tobacco Use   Smoking status: Former    Current packs/day: 0.00    Types: Cigarettes    Quit date: 07/14/1983    Years since quitting: 40.7   Smokeless tobacco: Never  Vaping Use   Vaping status: Never Used  Substance and Sexual Activity   Alcohol use: No   Drug use: No   Sexual activity: Not on file  Other Topics Concern   Not on file  Social History Narrative   Not on file   Social Drivers of Health   Financial Resource Strain: Not on file  Food Insecurity: No Food Insecurity (02/01/2024)   Hunger Vital Sign  Worried About Programme researcher, broadcasting/film/video in the Last Year: Never true    Ran Out of Food in the Last Year: Never true  Transportation Needs: No Transportation Needs (02/01/2024)   PRAPARE - Administrator, Civil Service (Medical): No    Lack of Transportation (Non-Medical): No  Physical Activity: Not on file  Stress: Not on file  Social Connections: Unknown (02/03/2024)   Social Connection and Isolation Panel [NHANES]    Frequency of Communication with Friends and Family: Patient declined    Frequency of Social Gatherings with Friends and Family: Patient declined    Attends Religious Services: Not on Insurance claims handler of Clubs or Organizations: Patient declined    Attends Banker Meetings: Not on file    Marital Status: Patient declined  Intimate Partner Violence: Not At Risk (02/01/2024)   Humiliation, Afraid, Rape, and Kick questionnaire    Fear of Current or Ex-Partner: No    Emotionally Abused: No    Physically Abused: No    Sexually  Abused: No   Family History  Problem Relation Age of Onset   Cancer Father    Cancer Sister    Cancer Brother    Cancer Sister    Cancer Other       VITAL SIGNS BP 121/87   Pulse (!) 59   Temp (!) 97.3 F (36.3 C)   Resp (!) 22   Wt 235 lb (106.6 kg)   SpO2 96% Comment: 2 l/min  BMI 33.24 kg/m   Outpatient Encounter Medications as of 04/03/2024  Medication Sig   acetaminophen (TYLENOL) 325 MG tablet Take 650 mg by mouth every 6 (six) hours as needed for fever or moderate pain (pain score 4-6).   Balsam [place]-Castor Oil (VENELEX) OINT Apply topically as needed.   busPIRone (BUSPAR) 5 MG tablet Take 5 mg by mouth 3 (three) times daily.   cyanocobalamin (VITAMIN B12) 1000 MCG tablet Take 1,000 mcg by mouth daily.   diclofenac Sodium (VOLTAREN ARTHRITIS PAIN) 1 % GEL Apply topically 3 (three) times daily.   ELIQUIS 2.5 MG TABS tablet Take 2.5 mg by mouth 2 (two) times daily.   metoprolol tartrate 37.5 MG TABS Take 1 tablet (37.5 mg total) by mouth 2 (two) times daily.   miconazole (MICOTIN) 2 % powder Apply topically in the morning and at bedtime.   Multiple Vitamins-Minerals (PRESERVISION AREDS 2) CAPS Take 1 tablet by mouth daily.   sennosides-docusate sodium (SENOKOT-S) 8.6-50 MG tablet Take 1 tablet by mouth daily.   sertraline (ZOLOFT) 50 MG tablet Take 50 mg by mouth daily.   sodium chloride 1 g tablet Take 1 tablet (1 g total) by mouth 2 (two) times daily with a meal.   No facility-administered encounter medications on file as of 04/03/2024.     Visit Problem List with A/P  Unintentional weight loss Recent onset of weight loss over last month of ~ 7% body weight 251 ==> 235 pounds Exam Alert, somewhat cooperative, perseverating on contacting the nurse who changes his foley at [facility] - he is unable to state reason he wants this.  Lung: BCTA; Cor irr irr, distant heart sounds. Abdomin: soft, NT, ND, obese, no palpable masses.  Ext: left heel with posterior  eschar, NT, no discharge expressed, normal surrounding skin appearing, no increased heat or erythema.  Right shin wrapped. Dressing clean and dry.  No obvious causes WL on Comprehensive Metabolic Panel, TSH, CBC  Assessment Suspect multifactorial causes,  including recent acute illness, dementia with paranoia-like suspiciousness, eating alone, mood disorder (depressive and anxious symptoms), history of PUD with similar weight loss profile in 2022.  Recommendation Follow up intermittent macrocytosis with Anemia panel.   Monitor weight with improvement with institution of nutritional supplement and start of anxiolytic, buspirone. Increase buspirone dose.  Consider increase in sertraline dose to 100 mg daily given possible mood disorder component to weight loss Monitor for patient's suspiciousness generalizing to his food. Ask dental service to assess teeth and dentures If abdominal pain develops, consider trial of PPI given history of PUD.        Synthia Innocent NP [facility]  Contact [phone] Monday through Friday 8am- 5pm  After hours call [phone]

## 2024-04-04 NOTE — Assessment & Plan Note (Signed)
 Recent onset of weight loss over last month of ~ 7% body weight 251 ==> 235 pounds Exam Alert, somewhat cooperative, perseverating on contacting the nurse who changes his foley at [facility] - he is unable to state reason he wants this.  Lung: BCTA; Cor irr irr, distant heart sounds. Abdomin: soft, NT, ND, obese, no palpable masses.  Ext: left heel with posterior eschar, NT, no discharge expressed, normal surrounding skin appearing, no increased heat or erythema.  Right shin wrapped. Dressing clean and dry.  No obvious causes WL on Comprehensive Metabolic Panel, TSH, CBC  Assessment Suspect multifactorial causes, including recent acute illness, dementia with paranoia-like suspiciousness, eating alone, mood disorder (depressive and anxious symptoms), history of PUD with similar weight loss profile in 2022.  Recommendation Follow up intermittent macrocytosis with Anemia panel.   Monitor weight with improvement with institution of nutritional supplement and start of anxiolytic, buspirone. Increase buspirone dose.  Consider increase in sertraline dose to 100 mg daily given possible mood disorder component to weight loss Monitor for patient's suspiciousness generalizing to his food. Ask dental service to assess teeth and dentures If abdominal pain develops, consider trial of PPI given history of PUD.

## 2024-04-06 ENCOUNTER — Other Ambulatory Visit (HOSPITAL_COMMUNITY)
Admission: RE | Admit: 2024-04-06 | Discharge: 2024-04-06 | Disposition: A | Discharge status: Home / Self Care | Source: Skilled Nursing Facility | Attending: Adult Health | Admitting: Adult Health

## 2024-04-06 DIAGNOSIS — R634 Abnormal weight loss: Secondary | ICD-10-CM | POA: Diagnosis not present

## 2024-04-06 DIAGNOSIS — D7589 Other specified diseases of blood and blood-forming organs: Secondary | ICD-10-CM | POA: Insufficient documentation

## 2024-04-06 LAB — CBC
HCT: 36.4 % — ABNORMAL LOW (ref 39.0–52.0)
Hemoglobin: 10.9 g/dL — ABNORMAL LOW (ref 13.0–17.0)
MCH: 30 pg (ref 26.0–34.0)
MCHC: 29.9 g/dL — ABNORMAL LOW (ref 30.0–36.0)
MCV: 100.3 fL — ABNORMAL HIGH (ref 80.0–100.0)
Platelets: 167 10*3/uL (ref 150–400)
RBC: 3.63 MIL/uL — ABNORMAL LOW (ref 4.22–5.81)
RDW: 17.7 % — ABNORMAL HIGH (ref 11.5–15.5)
WBC: 6.3 10*3/uL (ref 4.0–10.5)
nRBC: 0 % (ref 0.0–0.2)

## 2024-04-06 LAB — FOLATE: Folate: 8 ng/mL (ref >5.9)

## 2024-04-06 LAB — VITAMIN B12: Vitamin B-12: 1009 pg/mL — ABNORMAL HIGH (ref 180–914)

## 2024-04-07 ENCOUNTER — Non-Acute Institutional Stay (SKILLED_NURSING_FACILITY): Payer: Self-pay | Admitting: Adult Health

## 2024-04-07 DIAGNOSIS — E222 Syndrome of inappropriate secretion of antidiuretic hormone: Secondary | ICD-10-CM | POA: Diagnosis not present

## 2024-04-07 DIAGNOSIS — F01518 Vascular dementia, unspecified severity, with other behavioral disturbance: Secondary | ICD-10-CM

## 2024-04-07 DIAGNOSIS — I700 Atherosclerosis of aorta: Secondary | ICD-10-CM

## 2024-04-07 NOTE — Progress Notes (Signed)
 Location:  [facility] Room Number: 139 Place of Service:  SNF ([id number])   CODE STATUS: dnr   No Known Allergies  Chief Complaint  Patient presents with   Acute Visit    Care plan meeting     HPI:  We have come together for his care plan meeting. Family present. Feb 2025: BIMS 15/5 mood 0/30. Is in bed; without falls. He requires dependent assist with his adl care. He has a foley and is incontinent of bowel. Dietary: setup for meals; regular diet: appetite variable; weight now is 235.8 pounds. Therapy: none at this time. He continues to be followed for his chronic illnesses including: Vascular dementia with behavioral disturbance Aortic calcification  SIADH (syndrome of inappropriate ADH hormone) He is having anxiety; does not know where he is; he does call his family with this confusion and anxiety. We are going to reduce his pill burden by stopping his vitamins. We have discussed his anticoagulation; have explained the risks and benefits of stopping eliquis including dvt; mi; cva. Will stop this medication as well. New MOST form has been filled out for no hospitalizations.    Past Medical History:  Diagnosis Date   Acute blood loss anemia 07/19/2021   Acute renal failure superimposed on stage 3b chronic kidney disease (HCC) 02/01/2024   AKI (acute kidney injury) (HCC) 07/31/2021   AKI in context of of GI bleed with severe anemia.  Hemoglobin down to 6.  Status post 7 units of packed red cells.  Creatinine peaked at 2.13 with nadir GFR of 20.  Creatinine 1.7 with GFR 37 prior to discharge.  2/4 - 02/05/2024 hospitalized with strep bacteremia in the context RLE cellulitis.  Creatinine peaked at 2.93  with a nadir GFR of 19.  Final creatinine 1.48 with GFR 43 indicating high sta   Antiplatelet or antithrombotic long-term use    Anxiety 04/03/2024   Aortic calcification (HCC) 09/03/2023   Atrial fibrillation (HCC) 07/17/2021   7/15 - 07/15/2021 hospitalized after a fall;  rhabdo related to being on the floor for 6 days.  AF with RVR documented in the hospital.  Rate control achieved with metoprolol.    TSH therapeutic  Not felt to be an anticoagulant candidate due to advanced age, comorbidities, and high risk of falling.  7/23 - 07/28/2021 PAF with RVR with rates in the 130s and hypotension in the context of acute GI bleed.   Cellulitis 05/17/2014   Cellulitis of right lower leg 10/12/2023   See 10/12/2023 irregular erythema superior to grayish hyperpigmentation over the distal shin.  Pre-existing massive edema of the right calf with 1.5+ pitting.     Cellulitis of right thigh 12/02/2023   Chronic constipation 09/03/2023   Chronic indwelling Foley catheter 09/02/2023   CKD (chronic kidney disease), stage III (HCC) 07/17/2021   At admission with rhabdomyolysis and UTI creatinine 1.39 and GFR 47.  With rehydration at discharge creatinine 1.22 and GFR 55 indicating CKD stage IIIa.     Coronary artery disease    DNR (do not resuscitate) 02/01/2024   Edema of both lower extremities due to peripheral venous insufficiency 09/22/2023   Exudative age-related macular degeneration, right eye, with active choroidal neovascularization (HCC) 08/23/2017   Group B Strep Bacteremia 02/01/2024   History of penectomy 09/02/2023   History of penile cancer 01/09/2015   Hyperlipidemia    Hyponatremia 08/29/2023   9/1 - 09/01/2023 sodium of 122 with reported excessive intake of p.o. fluids.  500 cc normal  saline administered with subsequent initiation of sodium tablets. K+ 2.7.The a.m. cortisol normal at 13.2.  Final sodium 132.     Lobar pneumonia (HCC) 03/02/2024   4/1 - 03/30/24 weekend O2 sats were in the low 90s.  Portable chest x-ray 3/3 suggested right upper lobe pneumonia; oral doxycycline initiated.  Abnormal oscillatory sounds documented by staff 3/6 despite nebulizer treatments and antibiotics.  3/3 x-ray personally reviewed.  Right upper lobe pneumonia suggested in addition to a  background of marked interstitial nodular/reticular changes diffusely.     Neurogenic bladder 02/26/2016   Formatting of this note might be different from the original.  -TURP(11/05/2008) BPH  -Urodynamics (08/2009) acontractile bladder.   -CIC ~4x/day     Obesity    Penile cancer (HCC)    Peripheral vascular disease, unspecified (HCC) 07/13/2013   Sepsis due to group B Streptococcus (HCC) 02/02/2024   2/4 - 02/05/2024 cellulitis of LLE with clinical progression with hypotension and tachycardia despite Zosyn via PICC line since 10/3 at [facility].  Cellulitis in context of PVD with critical limb ischemia.  White count 19,300 and lactic acid 4.1.  Vanc, cefepime, metronidazole initiated with IV fluids.  Group B strep on C&S.  Antibiotics transitioned to ceftriaxone with continuation of amoxicillin for 5 a   SIADH (syndrome of inappropriate ADH production) (HCC) 09/03/2023   Stasis edema with ulcer (HCC) 08/17/2013   Status post urethrostomy (HCC) 09/02/2023   Swelling of extremity    Left Leg   Unspecified protein-calorie malnutrition (HCC) 02/16/2024   Urinary retention 03/27/2020   Dr Wilkie Aye, [facility] changes Foley cath monthly     Vascular dementia with behavioral disturbance [facility]) 09/17/2023   Beleifs that family members are stealing from him  08/29/2023 CT of the head revealed atrophy and small vessel white matter ischemic changes suggesting vascular dementia.  07/29/2021 SLUMS MMSE revealed a score of 22 out of 30 indicating minimal neurocognitive deficit.  09/02/2019 4 repeat SLUMS revealed a score of 8 out of 30 compatible with severe dementia.  09/08/2023 BCAT MMSE a score of 24 out of 50   Weight loss 04/03/2024    Past Surgical History:  Procedure Laterality Date   APPENDECTOMY     BACK SURGERY     BIOPSY  07/22/2021   Procedure: BIOPSY;  Surgeon: Lynann Bologna, MD;  Location: [facility] ENDOSCOPY;  Service: Gastroenterology;;   CHOLECYSTECTOMY     CORONARY ANGIOPLASTY  11/2005   RCA  PCI AND STENTING WITH A CYPHER DES   CORONARY ARTERY BYPASS GRAFT     ESOPHAGOGASTRODUODENOSCOPY (EGD) WITH PROPOFOL N/A 07/22/2021   Procedure: ESOPHAGOGASTRODUODENOSCOPY (EGD) WITH PROPOFOL;  Surgeon: Lynann Bologna, MD;  Location: [facility] ENDOSCOPY;  Service: Gastroenterology;  Laterality: N/A;   HIATAL HERNIA REPAIR     lymph node removal     NM MYOCAR MULTIPLE W/SPECT  11/26/2009   EF 62%. NORMAL MYOCARDIAL PERFUSION STUDY.   TRANSTHORACIC ECHOCARDIOGRAM  12/22/2005   MILD TO MOD AORTIC SCLEROSIS W/O STENOSIS. MILD TO MODERATE MITRAL CALCIFICATION. LA- MILDLY DILATED.    Social History   Socioeconomic History   Marital status: Divorced    Spouse name: Not on file   Number of children: Not on file   Years of education: Not on file   Highest education level: Not on file  Occupational History   Occupation: retired    Associate Professor: RETIRED  Tobacco Use   Smoking status: Former    Current packs/day: 0.00    Types: Cigarettes    Quit  date: 07/14/1983    Years since quitting: 40.7   Smokeless tobacco: Never  Vaping Use   Vaping status: Never Used  Substance and Sexual Activity   Alcohol use: No   Drug use: No   Sexual activity: Not on file  Other Topics Concern   Not on file  Social History Narrative   Not on file   Social Drivers of Health   Financial Resource Strain: Not on file  Food Insecurity: No Food Insecurity (02/01/2024)   Hunger Vital Sign    Worried About Running Out of Food in the Last Year: Never true    Ran Out of Food in the Last Year: Never true  Transportation Needs: No Transportation Needs (02/01/2024)   PRAPARE - Administrator, Civil Service (Medical): No    Lack of Transportation (Non-Medical): No  Physical Activity: Not on file  Stress: Not on file  Social Connections: Unknown (02/03/2024)   Social Connection and Isolation Panel [NHANES]    Frequency of Communication with Friends and Family: Patient declined    Frequency of Social Gatherings with  Friends and Family: Patient declined    Attends Religious Services: Not on Insurance claims handler of Clubs or Organizations: Patient declined    Attends Banker Meetings: Not on file    Marital Status: Patient declined  Intimate Partner Violence: Not At Risk (02/01/2024)   Humiliation, Afraid, Rape, and Kick questionnaire    Fear of Current or Ex-Partner: No    Emotionally Abused: No    Physically Abused: No    Sexually Abused: No   Family History  Problem Relation Age of Onset   Cancer Father    Cancer Sister    Cancer Brother    Cancer Sister    Cancer Other       VITAL SIGNS BP 134/65   Pulse 65   Temp (!) 97.3 F (36.3 C)   Resp 20   Ht 5' 10.5" (1.791 m)   Wt 247 lb (112 kg)   SpO2 96%   BMI 34.94 kg/m   Outpatient Encounter Medications as of 04/07/2024  Medication Sig   acetaminophen (TYLENOL) 325 MG tablet Take 650 mg by mouth every 6 (six) hours as needed for fever or moderate pain (pain score 4-6).   Balsam [place]-Castor Oil (VENELEX) OINT Apply topically as needed.   busPIRone (BUSPAR) 5 MG tablet Take 5 mg by mouth 3 (three) times daily.   cyanocobalamin (VITAMIN B12) 1000 MCG tablet Take 1,000 mcg by mouth daily.   diclofenac Sodium (VOLTAREN ARTHRITIS PAIN) 1 % GEL Apply topically 3 (three) times daily.   ELIQUIS 2.5 MG TABS tablet Take 2.5 mg by mouth 2 (two) times daily.   metoprolol tartrate 37.5 MG TABS Take 1 tablet (37.5 mg total) by mouth 2 (two) times daily.   miconazole (MICOTIN) 2 % powder Apply topically in the morning and at bedtime.   Multiple Vitamins-Minerals (PRESERVISION AREDS 2) CAPS Take 1 tablet by mouth daily.   sennosides-docusate sodium (SENOKOT-S) 8.6-50 MG tablet Take 1 tablet by mouth daily.   sertraline (ZOLOFT) 50 MG tablet Take 50 mg by mouth daily.   sodium chloride 1 g tablet Take 1 tablet (1 g total) by mouth 2 (two) times daily with a meal.   No facility-administered encounter medications on file as of 04/07/2024.      SIGNIFICANT DIAGNOSTIC EXAMS   08-29-23: wbc 7.8; hgb 11.0; hct 32.0; mcv 94.1 plt 184; glucose 127;  bun 40; creat 1.67; k+ 2.7; na++ 122; ca 8.8; gfr 37; protein 6.5 albumin 3.8; mag 3.8; tsh 2.133 09-01-23: glucose 120; bun 43; creat 1.97; k+ 3.9; na++ 132; ca 8.9; gfr 31 09-09-23: glucose 86; bun 37; creat 1.52; k+ 4.5; na++ 137; ca 8.8; gfr 42 09-23-23: d-dimer: 0.65 10-12-23: wbc 6.7; hgb 10.2; hct 32.5; mcv 102.2 plt 171; glucose 84; bun 26; creat 1.45; k+ 4.7; na++ 136; ca 9.0; gfr 44; d-dimer 0.71   12-24-23: glucose 117; bun 34; creat 1.56; k+ 4.2; na++ 137; ca 9.3 gfr 40 12-30-23: glucose 79; bun 28; creat 1.45; k+ 4.2; na++ 137; ca 9.0; gfr 44 01-31-24: wbc 19.3; hgb 11.3; hct 35.8; mcv 100.6 plt 152; glucose 129 bun 38; creat 1.96; k+ 4.9; na++ 136; ca 9.0 gfr 31 blood culture: group B strep 02-02-24: wbc 139.; hgb 8.9; hct 28.4; mcv 99.6 plt 117; glucose 103; bun 45; creat 1.62; k+ 4.2; na++ 135; ca 8.0; gfr 38; ast 114 alt 109; protein 5.5 albumin 2.5 02-05-24: wbc 8.6; hgb 9.2; hct 28.8; mcv 98.0 plt 180; glucose 92; bun 39; creat 1.48; k+ 4.3; na++ 135; ca 8.4 gfr 43 ast 50 alt 78; alk phos 140 protein 5.4 albumin 2.2   TODAY  02-28-24: wbc 5.5; hgb 9.9; hct 31.7; mcv 102.6 plt 132; glucose 88; bun 33; creat 1.22; k+ 4.1; na++ 138; ca 8.4; gfr 54; mag 2.0   Review of Systems  Constitutional:  Negative for malaise/fatigue.  Respiratory:  Negative for cough and shortness of breath.   Cardiovascular:  Negative for chest pain, palpitations and leg swelling.  Gastrointestinal:  Negative for abdominal pain, constipation and heartburn.  Musculoskeletal:  Negative for back pain, joint pain and myalgias.  Skin: Negative.   Neurological:  Negative for dizziness.  Psychiatric/Behavioral:  The patient is not nervous/anxious.    Physical Exam Constitutional:      General: He is not in acute distress.    Appearance: He is well-developed. He is obese. He is not diaphoretic.  Neck:      Thyroid: No thyromegaly.  Cardiovascular:     Rate and Rhythm: Normal rate and regular rhythm.     Heart sounds: Normal heart sounds.  Pulmonary:     Effort: Pulmonary effort is normal. No respiratory distress.     Breath sounds: Normal breath sounds.  Abdominal:     General: Bowel sounds are normal. There is no distension.     Palpations: Abdomen is soft.     Tenderness: There is no abdominal tenderness.  Genitourinary:    Comments: foley Musculoskeletal:     Cervical back: Neck supple.     Right lower leg: Edema present.     Left lower leg: Edema present.  Lymphadenopathy:     Cervical: No cervical adenopathy.  Skin:    General: Skin is warm and dry.  Neurological:     Mental Status: He is alert. Mental status is at baseline.  Psychiatric:        Mood and Affect: Mood normal.      ASSESSMENT/ PLAN:  TODAY  Vascular dementia with behavioral disturbance Aortic calcification SIADH (syndrome of inappropriate ADH hormone)  Will stop the following medications: vitamins; eliquis; will increase buspar to 7.5 mg three times daily  Will continue current plan of care Will continue to monitor his status  Time spent with patient: 40 minutes: (20 minutes spent with advanced directive new MOST form filled out) status; medications; dietary    Synthia Innocent NP  [facility]  call [phone]

## 2024-04-10 ENCOUNTER — Ambulatory Visit: Payer: Medicare Other | Admitting: Urology

## 2024-04-17 ENCOUNTER — Non-Acute Institutional Stay (SKILLED_NURSING_FACILITY): Payer: Self-pay | Admitting: Adult Health

## 2024-04-17 ENCOUNTER — Encounter: Payer: Self-pay | Admitting: Adult Health

## 2024-04-17 DIAGNOSIS — I700 Atherosclerosis of aorta: Secondary | ICD-10-CM | POA: Diagnosis not present

## 2024-04-17 DIAGNOSIS — K5909 Other constipation: Secondary | ICD-10-CM | POA: Diagnosis not present

## 2024-04-17 DIAGNOSIS — C609 Malignant neoplasm of penis, unspecified: Secondary | ICD-10-CM

## 2024-04-17 DIAGNOSIS — H353211 Exudative age-related macular degeneration, right eye, with active choroidal neovascularization: Secondary | ICD-10-CM

## 2024-04-17 DIAGNOSIS — I2510 Atherosclerotic heart disease of native coronary artery without angina pectoris: Secondary | ICD-10-CM

## 2024-04-17 NOTE — Progress Notes (Signed)
 Location:  [facility] Room Number: 139 Place of Service:  SNF (31)   CODE STATUS: dnr   No Known Allergies  Chief Complaint  Patient presents with   Medical Management of Chronic Issues         Chronic constipation: Coronary artery disease involving native coronary artery of native heart without angina pectoris:  Aortic calcification     HPI:  He is a 89 y.o. long term resident of this facility being seen for the management of his chronic illnesses:  Chronic constipation: Coronary artery disease involving native coronary artery of native heart without angina pectoris:  Aortic calcification. There are no reports of uncontrolled pain. Weight is without significant change in weight.    Past Medical History:  Diagnosis Date   Acute blood loss anemia 07/19/2021   Acute renal failure superimposed on stage 3b chronic kidney disease (HCC) 02/01/2024   AKI (acute kidney injury) (HCC) 07/31/2021   AKI in context of of GI bleed with severe anemia.  Hemoglobin down to 6.  Status post 7 units of packed red cells.  Creatinine peaked at 2.13 with nadir GFR of 20.  Creatinine 1.7 with GFR 37 prior to discharge.  2/4 - 02/05/2024 hospitalized with strep bacteremia in the context RLE cellulitis.  Creatinine peaked at 2.93  with a nadir GFR of 19.  Final creatinine 1.48 with GFR 43 indicating high sta   Antiplatelet or antithrombotic long-term use    Anxiety 04/03/2024   Aortic calcification (HCC) 09/03/2023   Atrial fibrillation (HCC) 07/17/2021   7/15 - 07/15/2021 hospitalized after a fall; rhabdo related to being on the floor for 6 days.  AF with RVR documented in the hospital.  Rate control achieved with metoprolol .    TSH therapeutic  Not felt to be an anticoagulant candidate due to advanced age, comorbidities, and high risk of falling.  7/23 - 07/28/2021 PAF with RVR with rates in the 130s and hypotension in the context of acute GI bleed.   Cellulitis 05/17/2014   Cellulitis  of right lower leg 10/12/2023   See 10/12/2023 irregular erythema superior to grayish hyperpigmentation over the distal shin.  Pre-existing massive edema of the right calf with 1.5+ pitting.     Cellulitis of right thigh 12/02/2023   Chronic constipation 09/03/2023   Chronic indwelling Foley catheter 09/02/2023   CKD (chronic kidney disease), stage III (HCC) 07/17/2021   At admission with rhabdomyolysis and UTI creatinine 1.39 and GFR 47.  With rehydration at discharge creatinine 1.22 and GFR 55 indicating CKD stage IIIa.     Coronary artery disease    DNR (do not resuscitate) 02/01/2024   Edema of both lower extremities due to peripheral venous insufficiency 09/22/2023   Exudative age-related macular degeneration, right eye, with active choroidal neovascularization (HCC) 08/23/2017   Group B Strep Bacteremia 02/01/2024   History of penectomy 09/02/2023   History of penile cancer 01/09/2015   Hyperlipidemia    Hyponatremia 08/29/2023   9/1 - 09/01/2023 sodium of 122 with reported excessive intake of p.o. fluids.  500 cc normal saline administered with subsequent initiation of sodium tablets. K+ 2.7.The a.m. cortisol normal at 13.2.  Final sodium 132.     Lobar pneumonia (HCC) 03/02/2024   4/1 - 03/30/24 weekend O2 sats were in the low 90s.  Portable chest x-ray 3/3 suggested right upper lobe pneumonia; oral doxycycline  initiated.  Abnormal oscillatory sounds documented by staff 3/6 despite nebulizer treatments and antibiotics.  3/3 x-ray personally  reviewed.  Right upper lobe pneumonia suggested in addition to a background of marked interstitial nodular/reticular changes diffusely.     Neurogenic bladder 02/26/2016   Formatting of this note might be different from the original.  -TURP(11/05/2008) BPH  -Urodynamics (08/2009) acontractile bladder.   -CIC ~4x/day     Obesity    Penile cancer (HCC)    Peripheral vascular disease, unspecified (HCC) 07/13/2013   Sepsis due to group B Streptococcus (HCC)  02/02/2024   2/4 - 02/05/2024 cellulitis of LLE with clinical progression with hypotension and tachycardia despite Zosyn via PICC line since 10/3 at [facility].  Cellulitis in context of PVD with critical limb ischemia.  White count 19,300 and lactic acid 4.1.  Vanc, cefepime , metronidazole  initiated with IV fluids.  Group B strep on C&S.  Antibiotics transitioned to ceftriaxone  with continuation of amoxicillin  for 5 a   SIADH (syndrome of inappropriate ADH production) (HCC) 09/03/2023   Stasis edema with ulcer (HCC) 08/17/2013   Status post urethrostomy (HCC) 09/02/2023   Swelling of extremity    Left Leg   Unspecified protein-calorie malnutrition (HCC) 02/16/2024   Urinary retention 03/27/2020   Dr Johnie Nailer, [facility] changes Foley cath monthly     Vascular dementia with behavioral disturbance [facility]) 09/17/2023   Beleifs that family members are stealing from him  08/29/2023 CT of the head revealed atrophy and small vessel white matter ischemic changes suggesting vascular dementia.  07/29/2021 SLUMS MMSE revealed a score of 22 out of 30 indicating minimal neurocognitive deficit.  09/02/2019 4 repeat SLUMS revealed a score of 8 out of 30 compatible with severe dementia.  09/08/2023 BCAT MMSE a score of 24 out of 50   Weight loss 04/03/2024    Past Surgical History:  Procedure Laterality Date   APPENDECTOMY     BACK SURGERY     BIOPSY  07/22/2021   Procedure: BIOPSY;  Surgeon: Lajuan Pila, MD;  Location: [facility] ENDOSCOPY;  Service: Gastroenterology;;   CHOLECYSTECTOMY     CORONARY ANGIOPLASTY  11/2005   RCA PCI AND STENTING WITH A CYPHER DES   CORONARY ARTERY BYPASS GRAFT     ESOPHAGOGASTRODUODENOSCOPY (EGD) WITH PROPOFOL  N/A 07/22/2021   Procedure: ESOPHAGOGASTRODUODENOSCOPY (EGD) WITH PROPOFOL ;  Surgeon: Lajuan Pila, MD;  Location: [facility] ENDOSCOPY;  Service: Gastroenterology;  Laterality: N/A;   HIATAL HERNIA REPAIR     lymph node removal     NM MYOCAR MULTIPLE W/SPECT  11/26/2009   EF 62%.  NORMAL MYOCARDIAL PERFUSION STUDY.   TRANSTHORACIC ECHOCARDIOGRAM  12/22/2005   MILD TO MOD AORTIC SCLEROSIS W/O STENOSIS. MILD TO MODERATE MITRAL CALCIFICATION. LA- MILDLY DILATED.    Social History   Socioeconomic History   Marital status: Divorced    Spouse name: Not on file   Number of children: Not on file   Years of education: Not on file   Highest education level: Not on file  Occupational History   Occupation: retired    Associate Professor: RETIRED  Tobacco Use   Smoking status: Former    Current packs/day: 0.00    Types: Cigarettes    Quit date: 07/14/1983    Years since quitting: 40.7   Smokeless tobacco: Never  Vaping Use   Vaping status: Never Used  Substance and Sexual Activity   Alcohol use: No   Drug use: No   Sexual activity: Not on file  Other Topics Concern   Not on file  Social History Narrative   Not on file   Social Drivers of Health  Financial Resource Strain: Not on file  Food Insecurity: No Food Insecurity (02/01/2024)   Hunger Vital Sign    Worried About Running Out of Food in the Last Year: Never true    Ran Out of Food in the Last Year: Never true  Transportation Needs: No Transportation Needs (02/01/2024)   PRAPARE - Administrator, Civil Service (Medical): No    Lack of Transportation (Non-Medical): No  Physical Activity: Not on file  Stress: Not on file  Social Connections: Unknown (02/03/2024)   Social Connection and Isolation Panel [NHANES]    Frequency of Communication with Friends and Family: Patient declined    Frequency of Social Gatherings with Friends and Family: Patient declined    Attends Religious Services: Not on Insurance claims handler of Clubs or Organizations: Patient declined    Attends Banker Meetings: Not on file    Marital Status: Patient declined  Intimate Partner Violence: Not At Risk (02/01/2024)   Humiliation, Afraid, Rape, and Kick questionnaire    Fear of Current or Ex-Partner: No    Emotionally  Abused: No    Physically Abused: No    Sexually Abused: No   Family History  Problem Relation Age of Onset   Cancer Father    Cancer Sister    Cancer Brother    Cancer Sister    Cancer Other       VITAL SIGNS BP 124/66   Pulse 80   Temp 98.2 F (36.8 C)   Resp 20   Ht 5' 10.5" (1.791 m)   Wt 235 lb 12.8 oz (107 kg)   SpO2 99%   BMI 33.36 kg/m   Outpatient Encounter Medications as of 04/17/2024  Medication Sig   acetaminophen  (TYLENOL ) 325 MG tablet Take 650 mg by mouth every 6 (six) hours as needed for fever or moderate pain (pain score 4-6).   aspirin  81 MG chewable tablet Chew 81 mg by mouth daily.   Balsam [place]-Castor Oil (VENELEX) OINT Apply topically as needed.   busPIRone (BUSPAR) 7.5 MG tablet Take 7.5 mg by mouth 3 (three) times daily.   diclofenac Sodium (VOLTAREN ARTHRITIS PAIN) 1 % GEL Apply topically 3 (three) times daily.   metoprolol  tartrate 37.5 MG TABS Take 1 tablet (37.5 mg total) by mouth 2 (two) times daily.   miconazole (MICOTIN) 2 % powder Apply topically in the morning and at bedtime.   sennosides-docusate sodium  (SENOKOT-S) 8.6-50 MG tablet Take 1 tablet by mouth daily.   sertraline (ZOLOFT) 50 MG tablet Take 50 mg by mouth daily.   sodium chloride  1 g tablet Take 1 g by mouth daily in the afternoon.   No facility-administered encounter medications on file as of 04/17/2024.     SIGNIFICANT DIAGNOSTIC EXAMS  08-29-23: wbc 7.8; hgb 11.0; hct 32.0; mcv 94.1 plt 184; glucose 127; bun 40; creat 1.67; k+ 2.7; na++ 122; ca 8.8; gfr 37; protein 6.5 albumin  3.8; mag 3.8; tsh 2.133 09-01-23: glucose 120; bun 43; creat 1.97; k+ 3.9; na++ 132; ca 8.9; gfr 31 09-09-23: glucose 86; bun 37; creat 1.52; k+ 4.5; na++ 137; ca 8.8; gfr 42 09-23-23: d-dimer: 0.65 10-12-23: wbc 6.7; hgb 10.2; hct 32.5; mcv 102.2 plt 171; glucose 84; bun 26; creat 1.45; k+ 4.7; na++ 136; ca 9.0; gfr 44; d-dimer 0.71   12-24-23: glucose 117; bun 34; creat 1.56; k+ 4.2; na++ 137; ca 9.3 gfr  40 12-30-23: glucose 79; bun 28; creat 1.45; k+ 4.2; na++ 137;  ca 9.0; gfr 44 01-31-24: wbc 19.3; hgb 11.3; hct 35.8; mcv 100.6 plt 152; glucose 129 bun 38; creat 1.96; k+ 4.9; na++ 136; ca 9.0 gfr 31 blood culture: group B strep 02-02-24: wbc 139.; hgb 8.9; hct 28.4; mcv 99.6 plt 117; glucose 103; bun 45; creat 1.62; k+ 4.2; na++ 135; ca 8.0; gfr 38; ast 114 alt 109; protein 5.5 albumin  2.5 02-05-24: wbc 8.6; hgb 9.2; hct 28.8; mcv 98.0 plt 180; glucose 92; bun 39; creat 1.48; k+ 4.3; na++ 135; ca 8.4 gfr 43 ast 50 alt 78; alk phos 140 protein 5.4 albumin  2.2   TODAY  02-28-24: wbc 5.5; hgb 9.9; hct 31.7; mcv 102.6 plt 132; glucose 88; bun 33; creat 1.22; k+ 4.1; na++ 138; ca 8.4; gfr 54; mag 2.0  Review of Systems  Constitutional:  Negative for malaise/fatigue.  Respiratory:  Negative for cough and shortness of breath.   Cardiovascular:  Negative for chest pain, palpitations and leg swelling.  Gastrointestinal:  Negative for abdominal pain, constipation and heartburn.  Musculoskeletal:  Negative for back pain, joint pain and myalgias.  Skin: Negative.   Neurological:  Negative for dizziness.  Psychiatric/Behavioral:  The patient is not nervous/anxious.    Physical Exam Constitutional:      General: He is not in acute distress.    Appearance: He is well-developed. He is obese. He is not diaphoretic.  Neck:     Thyroid : No thyromegaly.  Cardiovascular:     Rate and Rhythm: Normal rate and regular rhythm.     Pulses: Normal pulses.     Heart sounds: Normal heart sounds.  Pulmonary:     Effort: Pulmonary effort is normal. No respiratory distress.     Breath sounds: Normal breath sounds.  Abdominal:     General: Bowel sounds are normal. There is no distension.     Palpations: Abdomen is soft.     Tenderness: There is no abdominal tenderness.  Genitourinary:    Comments: foley Musculoskeletal:     Cervical back: Neck supple.     Right lower leg: Edema present.     Left lower leg: Edema  present.  Lymphadenopathy:     Cervical: No cervical adenopathy.  Skin:    General: Skin is warm and dry.  Neurological:     Mental Status: He is alert. Mental status is at baseline.  Psychiatric:        Mood and Affect: Mood normal.     ASSESSMENT/ PLAN:  TODAY  Chronic constipation: will continue senna s daily   2. Coronary artery disease involving native coronary artery of native heart without angina pectoris: asa 81 mg daily   3. Aortic calcification: (ct 02-17-16): is on asa 81 mg daily   PREVIOUS   4. SIADH (syndrome of inappropriate ADH production)/hyponatremia na++ 135; is off NACL   5. Paroxsymal atrial fibrillation: heart rate is stable lopressor  37.5 mg twice daily and eliquis  2.5 mg twice daily   6. Peripheral vascular disease/critical ischemic lower extremities: will monitor  7. Neurocognitive deficits/vascular dementia without behavioral disturbance: CT 08-29-23 demonstrates: atrophy with chronic small vessel white matter ischemic disease; SLUMS 8/30.   8. Stage 3b chronic  kidney disease (CKD): bun 39; creat 1.48 gfr 43  9. History of penile cancer/neurogenic bladder: has long term foley.     Britt Candle NP [facility]   call [phone]

## 2024-04-24 DIAGNOSIS — C609 Malignant neoplasm of penis, unspecified: Secondary | ICD-10-CM | POA: Insufficient documentation

## 2024-05-05 ENCOUNTER — Other Ambulatory Visit: Payer: Self-pay

## 2024-05-05 ENCOUNTER — Encounter (HOSPITAL_COMMUNITY): Payer: Self-pay

## 2024-05-05 ENCOUNTER — Emergency Department (HOSPITAL_COMMUNITY)
Admission: EM | Admit: 2024-05-05 | Discharge: 2024-05-05 | Disposition: A | Discharge status: Home / Self Care | Attending: Emergency Medicine | Admitting: Emergency Medicine

## 2024-05-05 DIAGNOSIS — R339 Retention of urine, unspecified: Secondary | ICD-10-CM | POA: Insufficient documentation

## 2024-05-05 DIAGNOSIS — R000 Tachycardia, unspecified: Secondary | ICD-10-CM | POA: Diagnosis not present

## 2024-05-05 DIAGNOSIS — N390 Urinary tract infection, site not specified: Secondary | ICD-10-CM | POA: Insufficient documentation

## 2024-05-05 DIAGNOSIS — N399 Disorder of urinary system, unspecified: Secondary | ICD-10-CM

## 2024-05-05 DIAGNOSIS — Z7982 Long term (current) use of aspirin: Secondary | ICD-10-CM | POA: Insufficient documentation

## 2024-05-05 DIAGNOSIS — R338 Other retention of urine: Secondary | ICD-10-CM

## 2024-05-05 DIAGNOSIS — R1030 Lower abdominal pain, unspecified: Secondary | ICD-10-CM | POA: Diagnosis present

## 2024-05-05 LAB — URINALYSIS, W/ REFLEX TO CULTURE (INFECTION SUSPECTED)
Bilirubin Urine: NEGATIVE
Glucose, UA: NEGATIVE mg/dL
Ketones, ur: NEGATIVE mg/dL
Nitrite: POSITIVE — AB
Protein, ur: 30 mg/dL — AB
Specific Gravity, Urine: 1.013 (ref 1.005–1.030)
WBC, UA: >50 WBC/hpf (ref 0–5)
pH: 5 (ref 5.0–8.0)

## 2024-05-05 MED ORDER — CEPHALEXIN 500 MG PO CAPS
500.0000 mg | ORAL_CAPSULE | Freq: Once | ORAL | Status: AC
  Administered 2024-05-05: 500 mg via ORAL
  Filled 2024-05-05: qty 1

## 2024-05-05 MED ORDER — CEPHALEXIN 500 MG PO CAPS: 500.0000 mg | ORAL_CAPSULE | Freq: Two times a day (BID) | ORAL | 0 refills | Status: AC

## 2024-05-05 NOTE — Discharge Instructions (Addendum)
 Please take all antibiotics as prescribed and follow-up with your physician.  Return here for concerning changes in your condition.

## 2024-05-05 NOTE — ED Triage Notes (Signed)
 Pt with foley catheter not draining all day from [facility] center.

## 2024-05-05 NOTE — ED Provider Notes (Signed)
 [place] EMERGENCY DEPARTMENT AT [facility] Provider Note   CSN: [id number] Arrival date & time: 05/05/24  1613     History  Chief Complaint  Patient presents with   foley problem    Caleb [name] is a 89 y.o. male.  HPI Patient presents from nursing facility with staff concern for dysfunctional Foley catheter.  Patient has a chronic indwelling Foley catheter, history of prior surgery with perineal urethrostomy.  Per nursing home staff patient has had not had urine production from that area.  Patient describes suprapubic pain, no report of fever, vomiting, change in her activity.  Patient arrives via EMS and I obtained history from this individuals on arrival as well.    Home Medications Prior to Admission medications   Medication Sig Start Date End Date Taking? Authorizing Provider  cephALEXin  (KEFLEX ) 500 MG capsule Take 1 capsule (500 mg total) by mouth 2 (two) times daily for 5 days. 05/05/24 05/10/24 Yes Dorenda Gandy, MD  acetaminophen  (TYLENOL ) 325 MG tablet Take 650 mg by mouth every 6 (six) hours as needed for fever or moderate pain (pain score 4-6).    [provider]  aspirin  81 MG chewable tablet Chew 81 mg by mouth daily.    [provider]  Enis Harsh Oil [facility]) OINT Apply topically as needed.    [provider]  busPIRone (BUSPAR) 7.5 MG tablet Take 7.5 mg by mouth 3 (three) times daily.    [provider]  diclofenac Sodium (VOLTAREN ARTHRITIS PAIN) 1 % GEL Apply topically 3 (three) times daily.    [provider]  metoprolol  tartrate 37.5 MG TABS Take 1 tablet (37.5 mg total) by mouth 2 (two) times daily. 02/05/24   Demaris Fillers, MD  miconazole (MICOTIN) 2 % powder Apply topically in the morning and at bedtime.    [provider]  sennosides-docusate sodium  (SENOKOT-S) 8.6-50 MG tablet Take 1 tablet by mouth daily.    [provider]  sertraline (ZOLOFT) 50 MG tablet Take 50 mg by  mouth daily.    [provider]  sodium chloride  1 g tablet Take 1 g by mouth daily in the afternoon.    [provider]      Allergies    Patient has no known allergies.    Review of Systems   Review of Systems  Physical Exam Updated Vital Signs BP 125/69   Pulse 82   Temp 98 F (36.7 C) (Oral)   Resp 18   Ht 5\' 10"  (1.778 m)   Wt 107 kg   SpO2 99%   BMI 33.83 kg/m  Physical Exam Vitals and nursing note reviewed. Exam conducted with a chaperone present.  Constitutional:      General: He is not in acute distress.    Appearance: He is well-developed.  HENT:     Head: Normocephalic and atraumatic.  Eyes:     Conjunctiva/sclera: Conjunctivae normal.  Cardiovascular:     Rate and Rhythm: Normal rate and regular rhythm.  Pulmonary:     Effort: Pulmonary effort is normal. No respiratory distress.     Breath sounds: No stridor.  Abdominal:     Tenderness: There is abdominal tenderness.  Genitourinary:   Skin:    General: Skin is warm and dry.  Neurological:     Mental Status: He is alert and oriented to person, place, and time.     ED Results / Procedures / Treatments   Labs (all labs ordered are listed,  but only abnormal results are displayed) Labs Reviewed  URINALYSIS, W/ REFLEX TO CULTURE (INFECTION SUSPECTED) - Abnormal; Notable for the following components:      Result Value   APPearance HAZY (*)    Hgb urine dipstick MODERATE (*)    Protein, ur 30 (*)    Nitrite POSITIVE (*)    Leukocytes,Ua LARGE (*)    Bacteria, UA RARE (*)    All other components within normal limits  URINE CULTURE    EKG None  Radiology No results found.  Procedures BLADDER CATHETERIZATION  Date/Time: 05/05/2024 6:00 PM  Performed by: Dorenda Gandy, MD Authorized by: Dorenda Gandy, MD   Consent:    Consent obtained:  Verbal   Consent given by:  Patient   Risks, benefits, and alternatives were discussed: yes     Risks discussed:  False passage,  infection, urethral injury, pain and incomplete procedure   Alternatives discussed:  Alternative treatment Universal protocol:    Immediately prior to procedure, a time out was called: yes     Patient identity confirmed:  Verbally with patient Pre-procedure details:    Procedure purpose:  Therapeutic   Preparation: Patient was prepped and draped in usual sterile fashion   Anesthesia:    Anesthesia method:  None Procedure details:    Provider performed due to:  Altered anatomy   Altered anatomy details: penis amputation.   Catheter insertion:  Indwelling   Catheter type:  Foley   Catheter size:  22 Fr   Bladder irrigation: no     Number of attempts:  1   Urine characteristics:  Yellow Post-procedure details:    Procedure completion:  Tolerated     Medications Ordered in ED Medications  cephALEXin  (KEFLEX ) capsule 500 mg (has no administration in time range)    ED Course/ Medical Decision Making/ A&P                                 Medical Decision Making Elderly male with prior genital surgery now with concern for suprapubic pain, lack of urine production, concern for infection versus urinary retention versus dysfunctional Foley catheter.  Patient is afebrile, awake, alert, denies other complaints, low initial suspicion for bacteremia, sepsis.  Chart review notable for multiple prior episodes of urinary retention. Pulse ox 99% room air normal  Amount and/or Complexity of Data Reviewed Independent Historian: EMS External Data Reviewed: notes. Labs: ordered. Decision-making details documented in ED Course.  Risk Prescription drug management. Decision regarding hospitalization. Diagnosis or treatment significantly limited by social determinants of health.  After the initial assessment with concern for dysfunctional urinary catheter nursing staff attempted to irrigate, without success.  Bladder ultrasound with 500 mL fluid with concern for acute urinary retention, I  discussed the case with our urology colleagues, given his surgical history.  Subsequently the patient had replacement of his Foley catheter performed by nursing staff and myself without complication, with subsequent drainage of substantial amounts of urine.  After reviewing the patient's culture from prior urinalysis, Keflex  started. Urinalysis consistent with infection, no vital sign abnormalities suggesting bacteremia, sepsis.  Patient started on antibiotics, discharged after initial dosing in the emergency department.   Final Clinical Impression(s) / ED Diagnoses Final diagnoses:  Acute urinary retention  Urinary tract disease    Rx / DC Orders ED Discharge Orders          Ordered    cephALEXin  (KEFLEX ) 500 MG capsule  2 times daily        05/05/24 Dorothye Gathers, MD 05/05/24 [phone]

## 2024-05-09 LAB — URINE CULTURE: Culture: 80000 — AB

## 2024-05-10 ENCOUNTER — Telehealth (HOSPITAL_BASED_OUTPATIENT_CLINIC_OR_DEPARTMENT_OTHER): Payer: Self-pay | Admitting: *Deleted

## 2024-05-10 NOTE — Telephone Encounter (Signed)
 Post ED Visit - Positive Culture Follow-up  Culture report reviewed by antimicrobial stewardship pharmacist: [facility] [x]  Joycelyn Noa Pharm.D. []  Skeet Duke, Pharm.D., BCPS AQ-ID []  Leslee Rase, Pharm.D., BCPS []  Garland Junk, Pharm.D., BCPS []  [place], [address].D., BCPS, AAHIVP []  Alcide Aly, Pharm.D., BCPS, AAHIVP []  Jerri Morale, PharmD, BCPS []  Graham Laws, PharmD, BCPS []  Cleda Curly, PharmD, BCPS []  Tamar Fairly, PharmD []  Ballard Levels, PharmD, BCPS []  Ollen Beverage, PharmD  [facility] []  Arlyne Bering, PharmD []  Sherryle Don, PharmD []  [place]elin[place], PharmD []  Delila Felty, Rph []  [place]) Cleora Daft, PharmD []  Augustina Block, PharmD []  Arie Kurtz, PharmD []  Sharlyn Deaner, PharmD []  Agnes Hose, PharmD []  Kendall Pauls, PharmD []  Gladstone Lamer, PharmD []  Armanda Bern, PharmD []  Tera Fellows, PharmD   Positive urine culture reviews by Cindy Creed PA Treated with Cephalexin , Discontinue Keflex  and no treatment needed.Nurse at [facility]- Advise ok to stop Keflex  no additional tratment is needed at this time, Faxed cx results to [phone]  Zuhayr, Bedrosian 05/10/2024, 4:52 PM

## 2024-05-15 ENCOUNTER — Non-Acute Institutional Stay (SKILLED_NURSING_FACILITY): Payer: Self-pay | Admitting: Student

## 2024-05-15 DIAGNOSIS — N183 Chronic kidney disease, stage 3 unspecified: Secondary | ICD-10-CM

## 2024-05-15 DIAGNOSIS — S81802D Unspecified open wound, left lower leg, subsequent encounter: Secondary | ICD-10-CM

## 2024-05-15 DIAGNOSIS — E785 Hyperlipidemia, unspecified: Secondary | ICD-10-CM

## 2024-05-15 DIAGNOSIS — I480 Paroxysmal atrial fibrillation: Secondary | ICD-10-CM

## 2024-05-15 DIAGNOSIS — F419 Anxiety disorder, unspecified: Secondary | ICD-10-CM | POA: Diagnosis not present

## 2024-05-15 DIAGNOSIS — E222 Syndrome of inappropriate secretion of antidiuretic hormone: Secondary | ICD-10-CM

## 2024-05-15 DIAGNOSIS — R634 Abnormal weight loss: Secondary | ICD-10-CM

## 2024-05-15 DIAGNOSIS — K5909 Other constipation: Secondary | ICD-10-CM | POA: Diagnosis not present

## 2024-05-15 DIAGNOSIS — R443 Hallucinations, unspecified: Secondary | ICD-10-CM | POA: Diagnosis not present

## 2024-05-15 DIAGNOSIS — I2510 Atherosclerotic heart disease of native coronary artery without angina pectoris: Secondary | ICD-10-CM

## 2024-05-15 NOTE — Progress Notes (Signed)
 Location:  [facility]   Place of Service:   [facility]   CODE STATUS: DNR   HPI: Doing well, but has some complaints about being here and his meals. Looks like he hasn't been eating all his meals/refusing meals.    Past Medical History:  Diagnosis Date   Acute blood loss anemia 07/19/2021   Acute renal failure superimposed on stage 3b chronic kidney disease (HCC) 02/01/2024   AKI (acute kidney injury) (HCC) 07/31/2021   AKI in context of of GI bleed with severe anemia.  Hemoglobin down to 6.  Status post 7 units of packed red cells.  Creatinine peaked at 2.13 with nadir GFR of 20.  Creatinine 1.7 with GFR 37 prior to discharge.  2/4 - 02/05/2024 hospitalized with strep bacteremia in the context RLE cellulitis.  Creatinine peaked at 2.93  with a nadir GFR of 19.  Final creatinine 1.48 with GFR 43 indicating high sta   Antiplatelet or antithrombotic long-term use    Anxiety 04/03/2024   Aortic calcification (HCC) 09/03/2023   Atrial fibrillation (HCC) 07/17/2021   7/15 - 07/15/2021 hospitalized after a fall; rhabdo related to being on the floor for 6 days.  AF with RVR documented in the hospital.  Rate control achieved with metoprolol .    TSH therapeutic  Not felt to be an anticoagulant candidate due to advanced age, comorbidities, and high risk of falling.  7/23 - 07/28/2021 PAF with RVR with rates in the 130s and hypotension in the context of acute GI bleed.   Cellulitis 05/17/2014   Cellulitis of right lower leg 10/12/2023   See 10/12/2023 irregular erythema superior to grayish hyperpigmentation over the distal shin.  Pre-existing massive edema of the right calf with 1.5+ pitting.     Cellulitis of right thigh 12/02/2023   Chronic constipation 09/03/2023   Chronic indwelling Foley catheter 09/02/2023   CKD (chronic kidney disease), stage III (HCC) 07/17/2021   At admission with rhabdomyolysis and UTI creatinine 1.39 and GFR 47.  With rehydration at discharge creatinine  1.22 and GFR 55 indicating CKD stage IIIa.     Coronary artery disease    DNR (do not resuscitate) 02/01/2024   Edema of both lower extremities due to peripheral venous insufficiency 09/22/2023   Exudative age-related macular degeneration, right eye, with active choroidal neovascularization (HCC) 08/23/2017   Group B Strep Bacteremia 02/01/2024   History of penectomy 09/02/2023   History of penile cancer 01/09/2015   Hyperlipidemia    Hyponatremia 08/29/2023   9/1 - 09/01/2023 sodium of 122 with reported excessive intake of p.o. fluids.  500 cc normal saline administered with subsequent initiation of sodium tablets. K+ 2.7.The a.m. cortisol normal at 13.2.  Final sodium 132.     Lobar pneumonia (HCC) 03/02/2024   4/1 - 03/30/24 weekend O2 sats were in the low 90s.  Portable chest x-ray 3/3 suggested right upper lobe pneumonia; oral doxycycline  initiated.  Abnormal oscillatory sounds documented by staff 3/6 despite nebulizer treatments and antibiotics.  3/3 x-ray personally reviewed.  Right upper lobe pneumonia suggested in addition to a background of marked interstitial nodular/reticular changes diffusely.     Neurogenic bladder 02/26/2016   Formatting of this note might be different from the original.  -TURP(11/05/2008) BPH  -Urodynamics (08/2009) acontractile bladder.   -CIC ~4x/day     Obesity    Penile cancer (HCC)    Peripheral vascular disease, unspecified (HCC) 07/13/2013   Sepsis due to group B Streptococcus (HCC) 02/02/2024  2/4 - 02/05/2024 cellulitis of LLE with clinical progression with hypotension and tachycardia despite Zosyn via PICC line since 10/3 at [facility].  Cellulitis in context of PVD with critical limb ischemia.  White count 19,300 and lactic acid 4.1.  Vanc, cefepime , metronidazole  initiated with IV fluids.  Group B strep on C&S.  Antibiotics transitioned to ceftriaxone  with continuation of amoxicillin  for 5 a   SIADH (syndrome of inappropriate ADH production) (HCC) 09/03/2023    Stasis edema with ulcer (HCC) 08/17/2013   Status post urethrostomy (HCC) 09/02/2023   Swelling of extremity    Left Leg   Unspecified protein-calorie malnutrition (HCC) 02/16/2024   Urinary retention 03/27/2020   Dr Johnie Nailer, [facility] changes Foley cath monthly     Vascular dementia with behavioral disturbance [facility]) 09/17/2023   Beleifs that family members are stealing from him  08/29/2023 CT of the head revealed atrophy and small vessel white matter ischemic changes suggesting vascular dementia.  07/29/2021 SLUMS MMSE revealed a score of 22 out of 30 indicating minimal neurocognitive deficit.  09/02/2019 4 repeat SLUMS revealed a score of 8 out of 30 compatible with severe dementia.  09/08/2023 BCAT MMSE a score of 24 out of 50   Weight loss 04/03/2024    Past Surgical History:  Procedure Laterality Date   APPENDECTOMY     BACK SURGERY     BIOPSY  07/22/2021   Procedure: BIOPSY;  Surgeon: Lajuan Pila, MD;  Location: [facility] ENDOSCOPY;  Service: Gastroenterology;;   CHOLECYSTECTOMY     CORONARY ANGIOPLASTY  11/2005   RCA PCI AND STENTING WITH A CYPHER DES   CORONARY ARTERY BYPASS GRAFT     ESOPHAGOGASTRODUODENOSCOPY (EGD) WITH PROPOFOL  N/A 07/22/2021   Procedure: ESOPHAGOGASTRODUODENOSCOPY (EGD) WITH PROPOFOL ;  Surgeon: Lajuan Pila, MD;  Location: [facility] ENDOSCOPY;  Service: Gastroenterology;  Laterality: N/A;   HIATAL HERNIA REPAIR     lymph node removal     NM MYOCAR MULTIPLE W/SPECT  11/26/2009   EF 62%. NORMAL MYOCARDIAL PERFUSION STUDY.   TRANSTHORACIC ECHOCARDIOGRAM  12/22/2005   MILD TO MOD AORTIC SCLEROSIS W/O STENOSIS. MILD TO MODERATE MITRAL CALCIFICATION. LA- MILDLY DILATED.    Social History   Socioeconomic History   Marital status: Divorced    Spouse name: Not on file   Number of children: Not on file   Years of education: Not on file   Highest education level: Not on file  Occupational History   Occupation: retired    Associate Professor: RETIRED  Tobacco Use    Smoking status: Former    Current packs/day: 0.00    Types: Cigarettes    Quit date: 07/14/1983    Years since quitting: 40.8   Smokeless tobacco: Never  Vaping Use   Vaping status: Never Used  Substance and Sexual Activity   Alcohol use: No   Drug use: No   Sexual activity: Not on file  Other Topics Concern   Not on file  Social History Narrative   Not on file   Social Drivers of Health   Financial Resource Strain: Not on file  Food Insecurity: No Food Insecurity (02/01/2024)   Hunger Vital Sign    Worried About Running Out of Food in the Last Year: Never true    Ran Out of Food in the Last Year: Never true  Transportation Needs: No Transportation Needs (02/01/2024)   PRAPARE - Administrator, Civil Service (Medical): No    Lack of Transportation (Non-Medical): No  Physical Activity: Not on file  Stress: Not on file  Social Connections: Unknown (02/03/2024)   Social Connection and Isolation Panel [NHANES]    Frequency of Communication with Friends and Family: Patient declined    Frequency of Social Gatherings with Friends and Family: Patient declined    Attends Religious Services: Not on Marketing executive or Organizations: Patient declined    Attends Banker Meetings: Not on file    Marital Status: Patient declined  Intimate Partner Violence: Not At Risk (02/01/2024)   Humiliation, Afraid, Rape, and Kick questionnaire    Fear of Current or Ex-Partner: No    Emotionally Abused: No    Physically Abused: No    Sexually Abused: No   Family History  Problem Relation Age of Onset   Cancer Father    Cancer Sister    Cancer Brother    Cancer Sister    Cancer Other     Physical Exam: General: NAD, conversant, frustrated Card: S1/S2, NRMG, irregularly irregular Resp: CTABL, good WOB Ext: 1 cm eschar with dry margins located on heel of left foot, wrapped in bandage, floating on pillow   VITAL SIGNS There were no vitals taken for this  visit.  Outpatient Encounter Medications as of 05/15/2024  Medication Sig   acetaminophen  (TYLENOL ) 325 MG tablet Take 650 mg by mouth every 6 (six) hours as needed for fever or moderate pain (pain score 4-6).   aspirin  81 MG chewable tablet Chew 81 mg by mouth daily.   Balsam [place]-Castor Oil (VENELEX) OINT Apply topically as needed.   busPIRone (BUSPAR) 7.5 MG tablet Take 7.5 mg by mouth 3 (three) times daily.   diclofenac Sodium (VOLTAREN ARTHRITIS PAIN) 1 % GEL Apply topically 3 (three) times daily.   metoprolol  tartrate 37.5 MG TABS Take 1 tablet (37.5 mg total) by mouth 2 (two) times daily.   miconazole (MICOTIN) 2 % powder Apply topically in the morning and at bedtime.   sennosides-docusate sodium  (SENOKOT-S) 8.6-50 MG tablet Take 1 tablet by mouth daily.   sertraline (ZOLOFT) 50 MG tablet Take 50 mg by mouth daily.   sodium chloride  1 g tablet Take 1 g by mouth daily in the afternoon.   No facility-administered encounter medications on file as of 05/15/2024.     SIGNIFICANT DIAGNOSTIC EXAMS       ASSESSMENT/ PLAN:  A. Fib - Eliquis  5 mg decided against by family. Continue Metoprolol  tartate 12.5 mg (x1.5)  Chronic constipation - Last BM was 5/19, continue Senna   SIADH - Hx of SIADH and hyponatremia, but is normal range for Na 03/22/24 of 139, will d/c Na tablet and rechek Na in 1 week..   Vascular Dementia / Neurocognitive defects / Hallucinations  - Pt experiencing anxiety and frustration w/ being resident. Will adjust Anxiety medication, patient was also having hallucinations  of fog and people in his room, but don't seem to be bothering patient at this time. Will continue to monitor, as no recent hallucinations for over 1 week. Use redirection when patient having episodes of frustration.   Muscular deconditioning / Leg wound Left - Wound care prn, and floating left foot for 1 cm wound on left heel with eschar, start Ensure daily, patient lost nearly 11 pounds since early  April, from not eating meals.   HLD - Not on statin, but given age, not needed.   Neurogenic bladder / S/p urethrostomy / CKD / UTI  - Foley care q shift ,Cr good at 1.03 on 3/36/25,  patient does not have UTI given < 100 K colonies on UA, although abx completed.  Anxiety - patient experiencing anxiety and frustration with being resident. Will increase zoloft to 100 mg, and buspar to 10 mg TID prn.      Britt Candle NP [facility]  Contact [phone] Monday through Friday 8am- 5pm  After hours call [phone]

## 2024-05-15 NOTE — Assessment & Plan Note (Signed)
 Pt experiencing anxiety and frustration w/ being resident. Will adjust Anxiety medication, patient was also having hallucinations  of fog and people in his room, but don't seem to be bothering patient at this time. Will continue to monitor, as no recent hallucinations for over 1 week. Use redirection when patient having episodes of frustration.

## 2024-05-15 NOTE — Assessment & Plan Note (Signed)
 Pt w/ Hx of SAIDH and Hyponatremia, Na most recently normal 03/22/24 at 139, will d/c salt tablets and recheck in 1 week. -D/c salt tablets -BMP 1 week

## 2024-05-15 NOTE — Assessment & Plan Note (Signed)
 Patient on senna regularly and last BM was today.  -Continue Senna

## 2024-05-15 NOTE — Assessment & Plan Note (Signed)
 Pt w/ hx of hyperlipidemia, given age and lack of benefit, patient not on statin.

## 2024-05-15 NOTE — Assessment & Plan Note (Signed)
 Patient experiencing anxiety and frustration with being resident. Will increase zoloft and buspar. -Zoloft 100 mg daily -Buspar 10 mg TID prn

## 2024-05-15 NOTE — Assessment & Plan Note (Signed)
 Continue ASA 81 daily

## 2024-05-15 NOTE — Addendum Note (Signed)
 Addended by: Marilyne Shu on: 05/15/2024 01:16 PM   Modules accepted: Level of Service

## 2024-05-15 NOTE — Assessment & Plan Note (Signed)
 Wound care prn, and floating left foot for 1 cm wound on left heel with eschar and dry margins -Wound care prn -Floating left foot

## 2024-05-15 NOTE — Assessment & Plan Note (Addendum)
 Pt noted to have irregularly irregular HR, but family has elected to not anticoagulate given age, and fall risk.  -No anticoagulation -Continue metoprolol  37.5 mg BID

## 2024-05-15 NOTE — Assessment & Plan Note (Signed)
 Patient lost nearly 11 pounds since early April, from not eating meals. Is having frustration with the timing of meals. Will start calorie shake to increase nutrients.  -Ensure daily

## 2024-05-15 NOTE — Assessment & Plan Note (Signed)
 Foley care q shift ,Cr good at 1.03 on 3/36/25, patient does not have UTI given < 100 K colonies on UA, although abx completed.  -CTM

## 2024-05-19 ENCOUNTER — Encounter: Payer: Self-pay | Admitting: Student

## 2024-05-19 ENCOUNTER — Encounter: Payer: Self-pay | Admitting: Internal Medicine

## 2024-05-19 ENCOUNTER — Encounter: Payer: Self-pay | Admitting: Adult Health

## 2024-05-23 ENCOUNTER — Encounter: Payer: Self-pay | Admitting: Adult Health

## 2024-05-23 ENCOUNTER — Non-Acute Institutional Stay (SKILLED_NURSING_FACILITY): Payer: Self-pay | Admitting: Adult Health

## 2024-05-23 DIAGNOSIS — F01518 Vascular dementia, unspecified severity, with other behavioral disturbance: Secondary | ICD-10-CM

## 2024-05-23 DIAGNOSIS — R634 Abnormal weight loss: Secondary | ICD-10-CM

## 2024-05-23 NOTE — Telephone Encounter (Signed)
Message routed to PCP Chilton Si, Chong Sicilian, NP

## 2024-05-23 NOTE — Progress Notes (Signed)
 Location:  [facility] Room Number: 139 Place of Service:  SNF ([id number])   CODE STATUS: dnr   No Known Allergies  Chief Complaint  Patient presents with   Acute Visit    Agitation and weight loss     HPI:  He has had episodes of agitation; with calling 911 multiple times. He has been wanting "eggs" during the night. He has been unable to redirected. He is losing weight from 247.7 pounds to 223.4 pounds. His appetite is 51-100%. He is on supplements twice daily. Unfortunately weight loss is expected outcome in the late stage of dementia. He is presently taking buspar 7.5 mg three times daily   Past Medical History:  Diagnosis Date   Acute blood loss anemia 07/19/2021   Acute renal failure superimposed on stage 3b chronic kidney disease (HCC) 02/01/2024   AKI (acute kidney injury) (HCC) 07/31/2021   AKI in context of of GI bleed with severe anemia.  Hemoglobin down to 6.  Status post 7 units of packed red cells.  Creatinine peaked at 2.13 with nadir GFR of 20.  Creatinine 1.7 with GFR 37 prior to discharge.  2/4 - 02/05/2024 hospitalized with strep bacteremia in the context RLE cellulitis.  Creatinine peaked at 2.93  with a nadir GFR of 19.  Final creatinine 1.48 with GFR 43 indicating high sta   Antiplatelet or antithrombotic long-term use    Anxiety 04/03/2024   Aortic calcification (HCC) 09/03/2023   Atrial fibrillation (HCC) 07/17/2021   7/15 - 07/15/2021 hospitalized after a fall; rhabdo related to being on the floor for 6 days.  AF with RVR documented in the hospital.  Rate control achieved with metoprolol .    TSH therapeutic  Not felt to be an anticoagulant candidate due to advanced age, comorbidities, and high risk of falling.  7/23 - 07/28/2021 PAF with RVR with rates in the 130s and hypotension in the context of acute GI bleed.   Cellulitis 05/17/2014   Cellulitis of right lower leg 10/12/2023   See 10/12/2023 irregular erythema superior to grayish  hyperpigmentation over the distal shin.  Pre-existing massive edema of the right calf with 1.5+ pitting.     Cellulitis of right thigh 12/02/2023   Chronic constipation 09/03/2023   Chronic indwelling Foley catheter 09/02/2023   CKD (chronic kidney disease), stage III (HCC) 07/17/2021   At admission with rhabdomyolysis and UTI creatinine 1.39 and GFR 47.  With rehydration at discharge creatinine 1.22 and GFR 55 indicating CKD stage IIIa.     Coronary artery disease    DNR (do not resuscitate) 02/01/2024   Edema of both lower extremities due to peripheral venous insufficiency 09/22/2023   Exudative age-related macular degeneration, right eye, with active choroidal neovascularization (HCC) 08/23/2017   Group B Strep Bacteremia 02/01/2024   History of penectomy 09/02/2023   History of penile cancer 01/09/2015   Hyperlipidemia    Hyponatremia 08/29/2023   9/1 - 09/01/2023 sodium of 122 with reported excessive intake of p.o. fluids.  500 cc normal saline administered with subsequent initiation of sodium tablets. K+ 2.7.The a.m. cortisol normal at 13.2.  Final sodium 132.     Lobar pneumonia (HCC) 03/02/2024   4/1 - 03/30/24 weekend O2 sats were in the low 90s.  Portable chest x-ray 3/3 suggested right upper lobe pneumonia; oral doxycycline  initiated.  Abnormal oscillatory sounds documented by staff 3/6 despite nebulizer treatments and antibiotics.  3/3 x-ray personally reviewed.  Right upper lobe pneumonia suggested in addition to  a background of marked interstitial nodular/reticular changes diffusely.     Neurogenic bladder 02/26/2016   Formatting of this note might be different from the original.  -TURP(11/05/2008) BPH  -Urodynamics (08/2009) acontractile bladder.   -CIC ~4x/day     Obesity    Penile cancer (HCC)    Peripheral vascular disease, unspecified (HCC) 07/13/2013   Sepsis due to group B Streptococcus (HCC) 02/02/2024   2/4 - 02/05/2024 cellulitis of LLE with clinical progression with  hypotension and tachycardia despite Zosyn via PICC line since 10/3 at [facility].  Cellulitis in context of PVD with critical limb ischemia.  White count 19,300 and lactic acid 4.1.  Vanc, cefepime , metronidazole  initiated with IV fluids.  Group B strep on C&S.  Antibiotics transitioned to ceftriaxone  with continuation of amoxicillin  for 5 a   SIADH (syndrome of inappropriate ADH production) (HCC) 09/03/2023   Stasis edema with ulcer (HCC) 08/17/2013   Status post urethrostomy (HCC) 09/02/2023   Swelling of extremity    Left Leg   Unspecified protein-calorie malnutrition (HCC) 02/16/2024   Urinary retention 03/27/2020   Dr Johnie Nailer, [facility] changes Foley cath monthly     Vascular dementia with behavioral disturbance [facility]) 09/17/2023   Beleifs that family members are stealing from him  08/29/2023 CT of the head revealed atrophy and small vessel white matter ischemic changes suggesting vascular dementia.  07/29/2021 SLUMS MMSE revealed a score of 22 out of 30 indicating minimal neurocognitive deficit.  09/02/2019 4 repeat SLUMS revealed a score of 8 out of 30 compatible with severe dementia.  09/08/2023 BCAT MMSE a score of 24 out of 50   Weight loss 04/03/2024    Past Surgical History:  Procedure Laterality Date   APPENDECTOMY     BACK SURGERY     BIOPSY  07/22/2021   Procedure: BIOPSY;  Surgeon: Lajuan Pila, MD;  Location: [facility] ENDOSCOPY;  Service: Gastroenterology;;   CHOLECYSTECTOMY     CORONARY ANGIOPLASTY  11/2005   RCA PCI AND STENTING WITH A CYPHER DES   CORONARY ARTERY BYPASS GRAFT     ESOPHAGOGASTRODUODENOSCOPY (EGD) WITH PROPOFOL  N/A 07/22/2021   Procedure: ESOPHAGOGASTRODUODENOSCOPY (EGD) WITH PROPOFOL ;  Surgeon: Lajuan Pila, MD;  Location: [facility] ENDOSCOPY;  Service: Gastroenterology;  Laterality: N/A;   HIATAL HERNIA REPAIR     lymph node removal     NM MYOCAR MULTIPLE W/SPECT  11/26/2009   EF 62%. NORMAL MYOCARDIAL PERFUSION STUDY.   TRANSTHORACIC ECHOCARDIOGRAM  12/22/2005    MILD TO MOD AORTIC SCLEROSIS W/O STENOSIS. MILD TO MODERATE MITRAL CALCIFICATION. LA- MILDLY DILATED.    Social History   Socioeconomic History   Marital status: Divorced    Spouse name: Not on file   Number of children: Not on file   Years of education: Not on file   Highest education level: Not on file  Occupational History   Occupation: retired    Associate Professor: RETIRED  Tobacco Use   Smoking status: Former    Current packs/day: 0.00    Types: Cigarettes    Quit date: 07/14/1983    Years since quitting: 40.8   Smokeless tobacco: Never  Vaping Use   Vaping status: Never Used  Substance and Sexual Activity   Alcohol use: No   Drug use: No   Sexual activity: Not on file  Other Topics Concern   Not on file  Social History Narrative   Not on file   Social Drivers of Health   Financial Resource Strain: Not on file  Food Insecurity:  No Food Insecurity (02/01/2024)   Hunger Vital Sign    Worried About Running Out of Food in the Last Year: Never true    Ran Out of Food in the Last Year: Never true  Transportation Needs: No Transportation Needs (02/01/2024)   PRAPARE - Administrator, Civil Service (Medical): No    Lack of Transportation (Non-Medical): No  Physical Activity: Not on file  Stress: Not on file  Social Connections: Unknown (02/03/2024)   Social Connection and Isolation Panel [NHANES]    Frequency of Communication with Friends and Family: Patient declined    Frequency of Social Gatherings with Friends and Family: Patient declined    Attends Religious Services: Not on Insurance claims handler of Clubs or Organizations: Patient declined    Attends Banker Meetings: Not on file    Marital Status: Patient declined  Intimate Partner Violence: Not At Risk (02/01/2024)   Humiliation, Afraid, Rape, and Kick questionnaire    Fear of Current or Ex-Partner: No    Emotionally Abused: No    Physically Abused: No    Sexually Abused: No   Family History   Problem Relation Age of Onset   Cancer Father    Cancer Sister    Cancer Brother    Cancer Sister    Cancer Other       VITAL SIGNS BP 106/67   Pulse 94   Temp 97.6 F (36.4 C)   Resp 20   Ht 5' 10.5" (1.791 m)   Wt 223 lb 6.4 oz (101.3 kg)   SpO2 97%   BMI 31.60 kg/m   Outpatient Encounter Medications as of 05/23/2024  Medication Sig   acetaminophen  (TYLENOL ) 325 MG tablet Take 650 mg by mouth every 6 (six) hours as needed for fever or moderate pain (pain score 4-6).   aspirin  81 MG chewable tablet Chew 81 mg by mouth daily.   Balsam [place]-Castor Oil (VENELEX) OINT Apply topically as needed.   busPIRone (BUSPAR) 7.5 MG tablet Take 7.5 mg by mouth 3 (three) times daily.   diclofenac Sodium (VOLTAREN ARTHRITIS PAIN) 1 % GEL Apply topically 3 (three) times daily.   metoprolol  tartrate 37.5 MG TABS Take 1 tablet (37.5 mg total) by mouth 2 (two) times daily.   miconazole (MICOTIN) 2 % powder Apply topically in the morning and at bedtime.   sennosides-docusate sodium  (SENOKOT-S) 8.6-50 MG tablet Take 1 tablet by mouth daily.   sertraline (ZOLOFT) 50 MG tablet Take 50 mg by mouth daily.   sodium chloride  1 g tablet Take 1 g by mouth daily in the afternoon.   No facility-administered encounter medications on file as of 05/23/2024.     SIGNIFICANT DIAGNOSTIC EXAMS  08-29-23: wbc 7.8; hgb 11.0; hct 32.0; mcv 94.1 plt 184; glucose 127; bun 40; creat 1.67; k+ 2.7; na++ 122; ca 8.8; gfr 37; protein 6.5 albumin  3.8; mag 3.8; tsh 2.133 09-01-23: glucose 120; bun 43; creat 1.97; k+ 3.9; na++ 132; ca 8.9; gfr 31 09-09-23: glucose 86; bun 37; creat 1.52; k+ 4.5; na++ 137; ca 8.8; gfr 42 09-23-23: d-dimer: 0.65 10-12-23: wbc 6.7; hgb 10.2; hct 32.5; mcv 102.2 plt 171; glucose 84; bun 26; creat 1.45; k+ 4.7; na++ 136; ca 9.0; gfr 44; d-dimer 0.71   12-24-23: glucose 117; bun 34; creat 1.56; k+ 4.2; na++ 137; ca 9.3 gfr 40 12-30-23: glucose 79; bun 28; creat 1.45; k+ 4.2; na++ 137; ca 9.0; gfr  44 01-31-24: wbc 19.3; hgb 11.3;  hct 35.8; mcv 100.6 plt 152; glucose 129 bun 38; creat 1.96; k+ 4.9; na++ 136; ca 9.0 gfr 31 blood culture: group B strep 02-02-24: wbc 139.; hgb 8.9; hct 28.4; mcv 99.6 plt 117; glucose 103; bun 45; creat 1.62; k+ 4.2; na++ 135; ca 8.0; gfr 38; ast 114 alt 109; protein 5.5 albumin  2.5 02-05-24: wbc 8.6; hgb 9.2; hct 28.8; mcv 98.0 plt 180; glucose 92; bun 39; creat 1.48; k+ 4.3; na++ 135; ca 8.4 gfr 43 ast 50 alt 78; alk phos 140 protein 5.4 albumin  2.2  02-28-24: wbc 5.5; hgb 9.9; hct 31.7; mcv 102.6 plt 132; glucose 88; bun 33; creat 1.22; k+ 4.1; na++ 138; ca 8.4; gfr 54; mag 2.0   NO NEW LABS.   Review of Systems  Constitutional:  Negative for malaise/fatigue.  Respiratory:  Negative for cough and shortness of breath.   Cardiovascular:  Negative for chest pain, palpitations and leg swelling.  Gastrointestinal:  Negative for abdominal pain, constipation and heartburn.  Musculoskeletal:  Negative for back pain, joint pain and myalgias.  Skin: Negative.   Neurological:  Negative for dizziness.  Psychiatric/Behavioral:  The patient is not nervous/anxious.    Physical Exam Constitutional:      General: He is not in acute distress.    Appearance: He is well-developed. He is obese. He is not diaphoretic.  Neck:     Thyroid : No thyromegaly.  Cardiovascular:     Rate and Rhythm: Normal rate and regular rhythm.     Pulses: Normal pulses.     Heart sounds: Normal heart sounds.  Pulmonary:     Effort: Pulmonary effort is normal. No respiratory distress.     Breath sounds: Normal breath sounds.  Abdominal:     General: Bowel sounds are normal. There is no distension.     Palpations: Abdomen is soft.     Tenderness: There is no abdominal tenderness.  Genitourinary:    Comments: foley Musculoskeletal:     Cervical back: Neck supple.     Right lower leg: Edema present.     Left lower leg: Edema present.  Lymphadenopathy:     Cervical: No cervical adenopathy.   Skin:    General: Skin is warm and dry.  Neurological:     Mental Status: He is alert. Mental status is at baseline.  Psychiatric:        Mood and Affect: Mood normal.      ASSESSMENT/ PLAN:  TODAY  Unintentional weight loss Dementia with behavioral disturbance  Will begin remeron 7.5 mg nightly through 06-23-24 Will increase buspar to 10 mg three times daily     Britt Candle NP [facility]  call [phone]

## 2024-05-23 NOTE — Telephone Encounter (Signed)
 MyChart message sent to patient with PCP reply.

## 2024-05-25 ENCOUNTER — Encounter: Payer: Self-pay | Admitting: Adult Health

## 2024-05-25 NOTE — Progress Notes (Unsigned)
 Location:  [facility] Room Number: 139 Place of Service:  SNF ([id number])   CODE STATUS: dnr   No Known Allergies  Chief Complaint  Patient presents with   Acute Visit    Care plan meeting.     HPI:  We have come together for his care plan meeting. Family present.  BIMS no mood 0/30. He remains in bed per his choice with one fall with skin tears. He requires dependent assist with his adl care. He is incontinent of bowel and has foley. Dietary: regular diet setup for meals; appetite 51-100% weight is 223.4 pounds; he continues to lose weight; on supplements. Therapy: none at this time. He will continue to be followed for his chronic illnesses including:   Paroxsymal atrial fibrillation  SIADH (syndrome of inappropriate ADH production) Vascular dementia with behavioral disturbance  Past Medical History:  Diagnosis Date   Acute blood loss anemia 07/19/2021   Acute renal failure superimposed on stage 3b chronic kidney disease (HCC) 02/01/2024   AKI (acute kidney injury) (HCC) 07/31/2021   AKI in context of of GI bleed with severe anemia.  Hemoglobin down to 6.  Status post 7 units of packed red cells.  Creatinine peaked at 2.13 with nadir GFR of 20.  Creatinine 1.7 with GFR 37 prior to discharge.  2/4 - 02/05/2024 hospitalized with strep bacteremia in the context RLE cellulitis.  Creatinine peaked at 2.93  with a nadir GFR of 19.  Final creatinine 1.48 with GFR 43 indicating high sta   Antiplatelet or antithrombotic long-term use    Anxiety 04/03/2024   Aortic calcification (HCC) 09/03/2023   Atrial fibrillation (HCC) 07/17/2021   7/15 - 07/15/2021 hospitalized after a fall; rhabdo related to being on the floor for 6 days.  AF with RVR documented in the hospital.  Rate control achieved with metoprolol .    TSH therapeutic  Not felt to be an anticoagulant candidate due to advanced age, comorbidities, and high risk of falling.  7/23 - 07/28/2021 PAF with RVR with rates in the 130s  and hypotension in the context of acute GI bleed.   Cellulitis 05/17/2014   Cellulitis of right lower leg 10/12/2023   See 10/12/2023 irregular erythema superior to grayish hyperpigmentation over the distal shin.  Pre-existing massive edema of the right calf with 1.5+ pitting.     Cellulitis of right thigh 12/02/2023   Chronic constipation 09/03/2023   Chronic indwelling Foley catheter 09/02/2023   CKD (chronic kidney disease), stage III (HCC) 07/17/2021   At admission with rhabdomyolysis and UTI creatinine 1.39 and GFR 47.  With rehydration at discharge creatinine 1.22 and GFR 55 indicating CKD stage IIIa.     Coronary artery disease    DNR (do not resuscitate) 02/01/2024   Edema of both lower extremities due to peripheral venous insufficiency 09/22/2023   Exudative age-related macular degeneration, right eye, with active choroidal neovascularization (HCC) 08/23/2017   Group B Strep Bacteremia 02/01/2024   History of penectomy 09/02/2023   History of penile cancer 01/09/2015   Hyperlipidemia    Hyponatremia 08/29/2023   9/1 - 09/01/2023 sodium of 122 with reported excessive intake of p.o. fluids.  500 cc normal saline administered with subsequent initiation of sodium tablets. K+ 2.7.The a.m. cortisol normal at 13.2.  Final sodium 132.     Lobar pneumonia (HCC) 03/02/2024   4/1 - 03/30/24 weekend O2 sats were in the low 90s.  Portable chest x-ray 3/3 suggested right upper lobe pneumonia; oral doxycycline   initiated.  Abnormal oscillatory sounds documented by staff 3/6 despite nebulizer treatments and antibiotics.  3/3 x-ray personally reviewed.  Right upper lobe pneumonia suggested in addition to a background of marked interstitial nodular/reticular changes diffusely.     Neurogenic bladder 02/26/2016   Formatting of this note might be different from the original.  -TURP(11/05/2008) BPH  -Urodynamics (08/2009) acontractile bladder.   -CIC ~4x/day     Obesity    Penile cancer (HCC)    Peripheral  vascular disease, unspecified (HCC) 07/13/2013   Sepsis due to group B Streptococcus (HCC) 02/02/2024   2/4 - 02/05/2024 cellulitis of LLE with clinical progression with hypotension and tachycardia despite Zosyn via PICC line since 10/3 at [facility].  Cellulitis in context of PVD with critical limb ischemia.  White count 19,300 and lactic acid 4.1.  Vanc, cefepime , metronidazole  initiated with IV fluids.  Group B strep on C&S.  Antibiotics transitioned to ceftriaxone  with continuation of amoxicillin  for 5 a   SIADH (syndrome of inappropriate ADH production) (HCC) 09/03/2023   Stasis edema with ulcer (HCC) 08/17/2013   Status post urethrostomy (HCC) 09/02/2023   Swelling of extremity    Left Leg   Unspecified protein-calorie malnutrition (HCC) 02/16/2024   Urinary retention 03/27/2020   Dr Johnie Nailer, [facility] changes Foley cath monthly     Vascular dementia with behavioral disturbance [facility]) 09/17/2023   Beleifs that family members are stealing from him  08/29/2023 CT of the head revealed atrophy and small vessel white matter ischemic changes suggesting vascular dementia.  07/29/2021 SLUMS MMSE revealed a score of 22 out of 30 indicating minimal neurocognitive deficit.  09/02/2019 4 repeat SLUMS revealed a score of 8 out of 30 compatible with severe dementia.  09/08/2023 BCAT MMSE a score of 24 out of 50   Weight loss 04/03/2024    Past Surgical History:  Procedure Laterality Date   APPENDECTOMY     BACK SURGERY     BIOPSY  07/22/2021   Procedure: BIOPSY;  Surgeon: Lajuan Pila, MD;  Location: [facility] ENDOSCOPY;  Service: Gastroenterology;;   CHOLECYSTECTOMY     CORONARY ANGIOPLASTY  11/2005   RCA PCI AND STENTING WITH A CYPHER DES   CORONARY ARTERY BYPASS GRAFT     ESOPHAGOGASTRODUODENOSCOPY (EGD) WITH PROPOFOL  N/A 07/22/2021   Procedure: ESOPHAGOGASTRODUODENOSCOPY (EGD) WITH PROPOFOL ;  Surgeon: Lajuan Pila, MD;  Location: [facility] ENDOSCOPY;  Service: Gastroenterology;  Laterality: N/A;   HIATAL  HERNIA REPAIR     lymph node removal     NM MYOCAR MULTIPLE W/SPECT  11/26/2009   EF 62%. NORMAL MYOCARDIAL PERFUSION STUDY.   TRANSTHORACIC ECHOCARDIOGRAM  12/22/2005   MILD TO MOD AORTIC SCLEROSIS W/O STENOSIS. MILD TO MODERATE MITRAL CALCIFICATION. LA- MILDLY DILATED.    Social History   Socioeconomic History   Marital status: Divorced    Spouse name: Not on file   Number of children: Not on file   Years of education: Not on file   Highest education level: Not on file  Occupational History   Occupation: retired    Associate Professor: RETIRED  Tobacco Use   Smoking status: Former    Current packs/day: 0.00    Types: Cigarettes    Quit date: 07/14/1983    Years since quitting: 40.8   Smokeless tobacco: Never  Vaping Use   Vaping status: Never Used  Substance and Sexual Activity   Alcohol use: No   Drug use: No   Sexual activity: Not on file  Other Topics Concern   Not  on file  Social History Narrative   Not on file   Social Drivers of Health   Financial Resource Strain: Not on file  Food Insecurity: No Food Insecurity (02/01/2024)   Hunger Vital Sign    Worried About Running Out of Food in the Last Year: Never true    Ran Out of Food in the Last Year: Never true  Transportation Needs: No Transportation Needs (02/01/2024)   PRAPARE - Administrator, Civil Service (Medical): No    Lack of Transportation (Non-Medical): No  Physical Activity: Not on file  Stress: Not on file  Social Connections: Unknown (02/03/2024)   Social Connection and Isolation Panel [NHANES]    Frequency of Communication with Friends and Family: Patient declined    Frequency of Social Gatherings with Friends and Family: Patient declined    Attends Religious Services: Not on Insurance claims handler of Clubs or Organizations: Patient declined    Attends Banker Meetings: Not on file    Marital Status: Patient declined  Intimate Partner Violence: Not At Risk (02/01/2024)   Humiliation,  Afraid, Rape, and Kick questionnaire    Fear of Current or Ex-Partner: No    Emotionally Abused: No    Physically Abused: No    Sexually Abused: No   Family History  Problem Relation Age of Onset   Cancer Father    Cancer Sister    Cancer Brother    Cancer Sister    Cancer Other       VITAL SIGNS BP 118/72   Pulse 88   Temp 97.6 F (36.4 C)   Resp 20   Ht 5' 10.5" (1.791 m)   Wt 223 lb 6.4 oz (101.3 kg)   SpO2 95%   BMI 31.60 kg/m   Outpatient Encounter Medications as of 05/26/2024  Medication Sig   acetaminophen  (TYLENOL ) 325 MG tablet Take 650 mg by mouth every 6 (six) hours as needed for fever or moderate pain (pain score 4-6).   aspirin  81 MG chewable tablet Chew 81 mg by mouth daily.   Balsam [place]-Castor Oil (VENELEX) OINT Apply topically as needed.   busPIRone (BUSPAR) 10 MG tablet Take 10 mg by mouth 3 (three) times daily.   diclofenac Sodium (VOLTAREN ARTHRITIS PAIN) 1 % GEL Apply topically 3 (three) times daily.   metoprolol  tartrate 37.5 MG TABS Take 1 tablet (37.5 mg total) by mouth 2 (two) times daily.   miconazole (MICOTIN) 2 % powder Apply topically in the morning and at bedtime.   mirtazapine (REMERON) 7.5 MG tablet Take 7.5 mg by mouth at bedtime.   sennosides-docusate sodium  (SENOKOT-S) 8.6-50 MG tablet Take 1 tablet by mouth daily.   sertraline (ZOLOFT) 50 MG tablet Take 50 mg by mouth daily.   sodium chloride  1 g tablet Take 1 g by mouth daily in the afternoon.   No facility-administered encounter medications on file as of 05/26/2024.     SIGNIFICANT DIAGNOSTIC EXAMS  08-29-23: wbc 7.8; hgb 11.0; hct 32.0; mcv 94.1 plt 184; glucose 127; bun 40; creat 1.67; k+ 2.7; na++ 122; ca 8.8; gfr 37; protein 6.5 albumin  3.8; mag 3.8; tsh 2.133 09-01-23: glucose 120; bun 43; creat 1.97; k+ 3.9; na++ 132; ca 8.9; gfr 31 09-09-23: glucose 86; bun 37; creat 1.52; k+ 4.5; na++ 137; ca 8.8; gfr 42 09-23-23: d-dimer: 0.65 10-12-23: wbc 6.7; hgb 10.2; hct 32.5; mcv 102.2  plt 171; glucose 84; bun 26; creat 1.45; k+ 4.7; na++ 136; ca  9.0; gfr 44; d-dimer 0.71   12-24-23: glucose 117; bun 34; creat 1.56; k+ 4.2; na++ 137; ca 9.3 gfr 40 12-30-23: glucose 79; bun 28; creat 1.45; k+ 4.2; na++ 137; ca 9.0; gfr 44 01-31-24: wbc 19.3; hgb 11.3; hct 35.8; mcv 100.6 plt 152; glucose 129 bun 38; creat 1.96; k+ 4.9; na++ 136; ca 9.0 gfr 31 blood culture: group B strep 02-02-24: wbc 139.; hgb 8.9; hct 28.4; mcv 99.6 plt 117; glucose 103; bun 45; creat 1.62; k+ 4.2; na++ 135; ca 8.0; gfr 38; ast 114 alt 109; protein 5.5 albumin  2.5 02-05-24: wbc 8.6; hgb 9.2; hct 28.8; mcv 98.0 plt 180; glucose 92; bun 39; creat 1.48; k+ 4.3; na++ 135; ca 8.4 gfr 43 ast 50 alt 78; alk phos 140 protein 5.4 albumin  2.2  02-28-24: wbc 5.5; hgb 9.9; hct 31.7; mcv 102.6 plt 132; glucose 88; bun 33; creat 1.22; k+ 4.1; na++ 138; ca 8.4; gfr 54; mag 2.0   NO NEW LABS.   Review of Systems  Constitutional:  Negative for malaise/fatigue.  Respiratory:  Negative for cough and shortness of breath.   Cardiovascular:  Negative for chest pain, palpitations and leg swelling.  Gastrointestinal:  Negative for abdominal pain, constipation and heartburn.  Musculoskeletal:  Negative for back pain, joint pain and myalgias.  Skin: Negative.   Neurological:  Negative for dizziness.  Psychiatric/Behavioral:  The patient is not nervous/anxious.    Physical Exam Constitutional:      General: He is not in acute distress.    Appearance: He is well-developed. He is not diaphoretic.  Neck:     Thyroid : No thyromegaly.  Cardiovascular:     Rate and Rhythm: Normal rate and regular rhythm.     Pulses: Normal pulses.     Heart sounds: Normal heart sounds.  Pulmonary:     Effort: Pulmonary effort is normal. No respiratory distress.     Breath sounds: Normal breath sounds.  Abdominal:     General: Bowel sounds are normal. There is no distension.     Palpations: Abdomen is soft.     Tenderness: There is no abdominal  tenderness.  Genitourinary:    Comments: foley Musculoskeletal:     Cervical back: Neck supple.     Right lower leg: Edema present.     Left lower leg: Edema present.  Lymphadenopathy:     Cervical: No cervical adenopathy.  Skin:    General: Skin is warm and dry.  Neurological:     Mental Status: He is alert. Mental status is at baseline.  Psychiatric:        Mood and Affect: Mood normal.     ASSESSMENT/ PLAN:  TODAY  Paroxsymal atrial fibrillation  SIADH (syndrome of inappropriate ADH production) Vascular dementia with behavioral disturbance  Will setup for speech therapy for meal assistance; and OT for geri-chair positioning; will lower lopressor  to 25 mg twice daily   Will continue current plan of care Will continue to monitor his status   Time spent with patient: 40 minutes: plan of care; dietary; medications.    Britt Candle NP [facility]   call [phone]

## 2024-05-26 ENCOUNTER — Non-Acute Institutional Stay (SKILLED_NURSING_FACILITY): Payer: Self-pay | Admitting: Adult Health

## 2024-05-26 DIAGNOSIS — I480 Paroxysmal atrial fibrillation: Secondary | ICD-10-CM

## 2024-05-26 DIAGNOSIS — E222 Syndrome of inappropriate secretion of antidiuretic hormone: Secondary | ICD-10-CM | POA: Diagnosis not present

## 2024-05-26 DIAGNOSIS — F01518 Vascular dementia, unspecified severity, with other behavioral disturbance: Secondary | ICD-10-CM | POA: Diagnosis not present

## 2024-05-29 ENCOUNTER — Other Ambulatory Visit (HOSPITAL_COMMUNITY)
Admission: RE | Admit: 2024-05-29 | Discharge: 2024-05-29 | Disposition: A | Discharge status: Home / Self Care | Source: Skilled Nursing Facility | Attending: Adult Health | Admitting: Adult Health

## 2024-05-29 DIAGNOSIS — E871 Hypo-osmolality and hyponatremia: Secondary | ICD-10-CM | POA: Diagnosis not present

## 2024-05-29 LAB — BASIC METABOLIC PANEL WITH GFR
Anion gap: 9 (ref 5–15)
BUN: 32 mg/dL — ABNORMAL HIGH (ref 8–23)
CO2: 25 mmol/L (ref 22–32)
Calcium: 9.1 mg/dL (ref 8.9–10.3)
Chloride: 109 mmol/L (ref 98–111)
Creatinine, Ser: 1.29 mg/dL — ABNORMAL HIGH (ref 0.61–1.24)
GFR, Estimated: 50 mL/min — ABNORMAL LOW (ref >60)
Glucose, Bld: 84 mg/dL (ref 70–99)
Potassium: 3.8 mmol/L (ref 3.5–5.1)
Sodium: 143 mmol/L (ref 135–145)

## 2024-07-01 ENCOUNTER — Encounter: Payer: Self-pay | Admitting: Adult Health

## 2024-07-05 DIAGNOSIS — I739 Peripheral vascular disease, unspecified: Secondary | ICD-10-CM | POA: Diagnosis not present

## 2024-07-05 DIAGNOSIS — F32A Depression, unspecified: Secondary | ICD-10-CM | POA: Diagnosis not present

## 2024-07-05 DIAGNOSIS — M6281 Muscle weakness (generalized): Secondary | ICD-10-CM | POA: Diagnosis not present

## 2024-07-05 DIAGNOSIS — I700 Atherosclerosis of aorta: Secondary | ICD-10-CM | POA: Diagnosis not present

## 2024-07-05 DIAGNOSIS — R600 Localized edema: Secondary | ICD-10-CM | POA: Diagnosis not present

## 2024-07-05 DIAGNOSIS — N1832 Chronic kidney disease, stage 3b: Secondary | ICD-10-CM | POA: Diagnosis not present

## 2024-07-05 DIAGNOSIS — R7881 Bacteremia: Secondary | ICD-10-CM | POA: Diagnosis not present

## 2024-07-05 DIAGNOSIS — R1312 Dysphagia, oropharyngeal phase: Secondary | ICD-10-CM | POA: Diagnosis not present

## 2024-07-05 DIAGNOSIS — I872 Venous insufficiency (chronic) (peripheral): Secondary | ICD-10-CM | POA: Diagnosis not present

## 2024-07-05 DIAGNOSIS — F411 Generalized anxiety disorder: Secondary | ICD-10-CM | POA: Diagnosis not present

## 2024-07-05 DIAGNOSIS — I480 Paroxysmal atrial fibrillation: Secondary | ICD-10-CM | POA: Diagnosis not present

## 2024-07-05 DIAGNOSIS — E46 Unspecified protein-calorie malnutrition: Secondary | ICD-10-CM | POA: Diagnosis not present

## 2024-07-05 DIAGNOSIS — F0150 Vascular dementia without behavioral disturbance: Secondary | ICD-10-CM | POA: Diagnosis not present

## 2024-07-06 DIAGNOSIS — R1312 Dysphagia, oropharyngeal phase: Secondary | ICD-10-CM | POA: Diagnosis not present

## 2024-07-06 DIAGNOSIS — N1832 Chronic kidney disease, stage 3b: Secondary | ICD-10-CM | POA: Diagnosis not present

## 2024-07-06 DIAGNOSIS — E46 Unspecified protein-calorie malnutrition: Secondary | ICD-10-CM | POA: Diagnosis not present

## 2024-07-06 DIAGNOSIS — R600 Localized edema: Secondary | ICD-10-CM | POA: Diagnosis not present

## 2024-07-06 DIAGNOSIS — F0150 Vascular dementia without behavioral disturbance: Secondary | ICD-10-CM | POA: Diagnosis not present

## 2024-07-06 DIAGNOSIS — R7881 Bacteremia: Secondary | ICD-10-CM | POA: Diagnosis not present

## 2024-07-10 ENCOUNTER — Encounter: Payer: Self-pay | Admitting: Adult Health

## 2024-07-10 ENCOUNTER — Non-Acute Institutional Stay (SKILLED_NURSING_FACILITY): Admitting: Adult Health

## 2024-07-10 DIAGNOSIS — F01518 Vascular dementia, unspecified severity, with other behavioral disturbance: Secondary | ICD-10-CM | POA: Diagnosis not present

## 2024-07-10 DIAGNOSIS — R627 Adult failure to thrive: Secondary | ICD-10-CM | POA: Diagnosis not present

## 2024-07-10 DIAGNOSIS — E46 Unspecified protein-calorie malnutrition: Secondary | ICD-10-CM | POA: Diagnosis not present

## 2024-07-10 DIAGNOSIS — R600 Localized edema: Secondary | ICD-10-CM | POA: Diagnosis not present

## 2024-07-10 DIAGNOSIS — R7881 Bacteremia: Secondary | ICD-10-CM | POA: Diagnosis not present

## 2024-07-10 DIAGNOSIS — N1832 Chronic kidney disease, stage 3b: Secondary | ICD-10-CM | POA: Diagnosis not present

## 2024-07-10 DIAGNOSIS — R1312 Dysphagia, oropharyngeal phase: Secondary | ICD-10-CM | POA: Diagnosis not present

## 2024-07-10 DIAGNOSIS — F0150 Vascular dementia without behavioral disturbance: Secondary | ICD-10-CM | POA: Diagnosis not present

## 2024-07-10 NOTE — Progress Notes (Signed)
 Location:  [facility] Room Number: 138 Place of Service:  SNF (31)   CODE STATUS: dnr   No Known Allergies  Chief Complaint  Patient presents with   Acute Visit    Change in status     HPI:  Over this past weekend he has had a change in status. He continues to loose weight is now at 208.4 pounds. This morning he is talking to me telling me that he needs help to eat lunch. There are no indications of pain or distress at this time.   Past Medical History:  Diagnosis Date   Acute blood loss anemia 07/19/2021   Acute renal failure superimposed on stage 3b chronic kidney disease (HCC) 02/01/2024   AKI (acute kidney injury) (HCC) 07/31/2021   AKI in context of of GI bleed with severe anemia.  Hemoglobin down to 6.  Status post 7 units of packed red cells.  Creatinine peaked at 2.13 with nadir GFR of 20.  Creatinine 1.7 with GFR 37 prior to discharge.  2/4 - 02/05/2024 hospitalized with strep bacteremia in the context RLE cellulitis.  Creatinine peaked at 2.93  with a nadir GFR of 19.  Final creatinine 1.48 with GFR 43 indicating high sta   Antiplatelet or antithrombotic long-term use    Anxiety 04/03/2024   Aortic calcification (HCC) 09/03/2023   Atrial fibrillation (HCC) 07/17/2021   7/15 - 07/15/2021 hospitalized after a fall; rhabdo related to being on the floor for 6 days.  AF with RVR documented in the hospital.  Rate control achieved with metoprolol .    TSH therapeutic  Not felt to be an anticoagulant candidate due to advanced age, comorbidities, and high risk of falling.  7/23 - 07/28/2021 PAF with RVR with rates in the 130s and hypotension in the context of acute GI bleed.   Cellulitis 05/17/2014   Cellulitis of right lower leg 10/12/2023   See 10/12/2023 irregular erythema superior to grayish hyperpigmentation over the distal shin.  Pre-existing massive edema of the right calf with 1.5+ pitting.     Cellulitis of right thigh 12/02/2023   Chronic constipation  09/03/2023   Chronic indwelling Foley catheter 09/02/2023   CKD (chronic kidney disease), stage III (HCC) 07/17/2021   At admission with rhabdomyolysis and UTI creatinine 1.39 and GFR 47.  With rehydration at discharge creatinine 1.22 and GFR 55 indicating CKD stage IIIa.     Coronary artery disease    DNR (do not resuscitate) 02/01/2024   Edema of both lower extremities due to peripheral venous insufficiency 09/22/2023   Exudative age-related macular degeneration, right eye, with active choroidal neovascularization (HCC) 08/23/2017   Group B Strep Bacteremia 02/01/2024   History of penectomy 09/02/2023   History of penile cancer 01/09/2015   Hyperlipidemia    Hyponatremia 08/29/2023   9/1 - 09/01/2023 sodium of 122 with reported excessive intake of p.o. fluids.  500 cc normal saline administered with subsequent initiation of sodium tablets. K+ 2.7.The a.m. cortisol normal at 13.2.  Final sodium 132.     Lobar pneumonia (HCC) 03/02/2024   4/1 - 03/30/24 weekend O2 sats were in the low 90s.  Portable chest x-ray 3/3 suggested right upper lobe pneumonia; oral doxycycline  initiated.  Abnormal oscillatory sounds documented by staff 3/6 despite nebulizer treatments and antibiotics.  3/3 x-ray personally reviewed.  Right upper lobe pneumonia suggested in addition to a background of marked interstitial nodular/reticular changes diffusely.     Neurogenic bladder 02/26/2016   Formatting of this  note might be different from the original.  -TURP(11/05/2008) BPH  -Urodynamics (08/2009) acontractile bladder.   -CIC ~4x/day     Obesity    Penile cancer (HCC)    Peripheral vascular disease, unspecified (HCC) 07/13/2013   Sepsis due to group B Streptococcus (HCC) 02/02/2024   2/4 - 02/05/2024 cellulitis of LLE with clinical progression with hypotension and tachycardia despite Zosyn via PICC line since 10/3 at [facility].  Cellulitis in context of PVD with critical limb ischemia.  White count 19,300 and lactic acid 4.1.   Vanc, cefepime , metronidazole  initiated with IV fluids.  Group B strep on C&S.  Antibiotics transitioned to ceftriaxone  with continuation of amoxicillin  for 5 a   SIADH (syndrome of inappropriate ADH production) (HCC) 09/03/2023   Stasis edema with ulcer (HCC) 08/17/2013   Status post urethrostomy (HCC) 09/02/2023   Swelling of extremity    Left Leg   Unspecified protein-calorie malnutrition (HCC) 02/16/2024   Urinary retention 03/27/2020   Dr Belvie Clara, [facility] changes Foley cath monthly     Vascular dementia with behavioral disturbance [facility]) 09/17/2023   Beleifs that family members are stealing from him  08/29/2023 CT of the head revealed atrophy and small vessel white matter ischemic changes suggesting vascular dementia.  07/29/2021 SLUMS MMSE revealed a score of 22 out of 30 indicating minimal neurocognitive deficit.  09/02/2019 4 repeat SLUMS revealed a score of 8 out of 30 compatible with severe dementia.  09/08/2023 BCAT MMSE a score of 24 out of 50   Weight loss 04/03/2024    Past Surgical History:  Procedure Laterality Date   APPENDECTOMY     BACK SURGERY     BIOPSY  07/22/2021   Procedure: BIOPSY;  Surgeon: Charlanne Groom, MD;  Location: [facility] ENDOSCOPY;  Service: Gastroenterology;;   CHOLECYSTECTOMY     CORONARY ANGIOPLASTY  11/2005   RCA PCI AND STENTING WITH A CYPHER DES   CORONARY ARTERY BYPASS GRAFT     ESOPHAGOGASTRODUODENOSCOPY (EGD) WITH PROPOFOL  N/A 07/22/2021   Procedure: ESOPHAGOGASTRODUODENOSCOPY (EGD) WITH PROPOFOL ;  Surgeon: Charlanne Groom, MD;  Location: [facility] ENDOSCOPY;  Service: Gastroenterology;  Laterality: N/A;   HIATAL HERNIA REPAIR     lymph node removal     NM MYOCAR MULTIPLE W/SPECT  11/26/2009   EF 62%. NORMAL MYOCARDIAL PERFUSION STUDY.   TRANSTHORACIC ECHOCARDIOGRAM  12/22/2005   MILD TO MOD AORTIC SCLEROSIS W/O STENOSIS. MILD TO MODERATE MITRAL CALCIFICATION. LA- MILDLY DILATED.    Social History   Socioeconomic History   Marital status:  Divorced    Spouse name: Not on file   Number of children: Not on file   Years of education: Not on file   Highest education level: Not on file  Occupational History   Occupation: retired    Associate Professor: RETIRED  Tobacco Use   Smoking status: Former    Current packs/day: 0.00    Types: Cigarettes    Quit date: 07/14/1983    Years since quitting: 41.0   Smokeless tobacco: Never  Vaping Use   Vaping status: Never Used  Substance and Sexual Activity   Alcohol use: No   Drug use: No   Sexual activity: Not on file  Other Topics Concern   Not on file  Social History Narrative   Not on file   Social Drivers of Health   Financial Resource Strain: Not on file  Food Insecurity: No Food Insecurity (02/01/2024)   Hunger Vital Sign    Worried About Running Out of Food in the  Last Year: Never true    Ran Out of Food in the Last Year: Never true  Transportation Needs: No Transportation Needs (02/01/2024)   PRAPARE - Administrator, Civil Service (Medical): No    Lack of Transportation (Non-Medical): No  Physical Activity: Not on file  Stress: Not on file  Social Connections: Unknown (02/03/2024)   Social Connection and Isolation Panel    Frequency of Communication with Friends and Family: Patient declined    Frequency of Social Gatherings with Friends and Family: Patient declined    Attends Religious Services: Not on Insurance claims handler of Clubs or Organizations: Patient declined    Attends Banker Meetings: Not on file    Marital Status: Patient declined  Intimate Partner Violence: Not At Risk (02/01/2024)   Humiliation, Afraid, Rape, and Kick questionnaire    Fear of Current or Ex-Partner: No    Emotionally Abused: No    Physically Abused: No    Sexually Abused: No   Family History  Problem Relation Age of Onset   Cancer Father    Cancer Sister    Cancer Brother    Cancer Sister    Cancer Other       VITAL SIGNS BP 112/76   Pulse 89   Temp 98 F  (36.7 C)   Resp (!) 22   Ht 5' 10.5 (1.791 m)   Wt 208 lb 6.4 oz (94.5 kg)   SpO2 98%   BMI 29.48 kg/m   Outpatient Encounter Medications as of 08-04-2024  Medication Sig   acetaminophen  (TYLENOL ) 325 MG tablet Take 650 mg by mouth every 6 (six) hours as needed for fever or moderate pain (pain score 4-6).   aspirin  81 MG chewable tablet Chew 81 mg by mouth daily.   Balsam [place]-Castor Oil (VENELEX) OINT Apply topically as needed.   busPIRone (BUSPAR) 10 MG tablet Take 10 mg by mouth 3 (three) times daily.   diclofenac Sodium (VOLTAREN ARTHRITIS PAIN) 1 % GEL Apply topically 3 (three) times daily.   metoprolol  tartrate (LOPRESSOR ) 25 MG tablet Take 25 mg by mouth 2 (two) times daily.   miconazole (MICOTIN) 2 % powder Apply topically in the morning and at bedtime.   mirtazapine (REMERON) 7.5 MG tablet Take 7.5 mg by mouth at bedtime.   sennosides-docusate sodium  (SENOKOT-S) 8.6-50 MG tablet Take 1 tablet by mouth daily.   sertraline (ZOLOFT) 50 MG tablet Take 50 mg by mouth daily.   sodium chloride  1 g tablet Take 1 g by mouth daily in the afternoon.   No facility-administered encounter medications on file as of August 04, 2024.     SIGNIFICANT DIAGNOSTIC EXAMS  08-29-23: wbc 7.8; hgb 11.0; hct 32.0; mcv 94.1 plt 184; glucose 127; bun 40; creat 1.67; k+ 2.7; na++ 122; ca 8.8; gfr 37; protein 6.5 albumin  3.8; mag 3.8; tsh 2.133 09-01-23: glucose 120; bun 43; creat 1.97; k+ 3.9; na++ 132; ca 8.9; gfr 31 09-09-23: glucose 86; bun 37; creat 1.52; k+ 4.5; na++ 137; ca 8.8; gfr 42 09-23-23: d-dimer: 0.65 10-12-23: wbc 6.7; hgb 10.2; hct 32.5; mcv 102.2 plt 171; glucose 84; bun 26; creat 1.45; k+ 4.7; na++ 136; ca 9.0; gfr 44; d-dimer 0.71   12-24-23: glucose 117; bun 34; creat 1.56; k+ 4.2; na++ 137; ca 9.3 gfr 40 12-30-23: glucose 79; bun 28; creat 1.45; k+ 4.2; na++ 137; ca 9.0; gfr 44 01-31-24: wbc 19.3; hgb 11.3; hct 35.8; mcv 100.6 plt 152; glucose 129 bun  38; creat 1.96; k+ 4.9; na++ 136; ca 9.0 gfr  31 blood culture: group B strep 02-02-24: wbc 139.; hgb 8.9; hct 28.4; mcv 99.6 plt 117; glucose 103; bun 45; creat 1.62; k+ 4.2; na++ 135; ca 8.0; gfr 38; ast 114 alt 109; protein 5.5 albumin  2.5 02-05-24: wbc 8.6; hgb 9.2; hct 28.8; mcv 98.0 plt 180; glucose 92; bun 39; creat 1.48; k+ 4.3; na++ 135; ca 8.4 gfr 43 ast 50 alt 78; alk phos 140 protein 5.4 albumin  2.2  02-28-24: wbc 5.5; hgb 9.9; hct 31.7; mcv 102.6 plt 132; glucose 88; bun 33; creat 1.22; k+ 4.1; na++ 138; ca 8.4; gfr 54; mag 2.0   NO NEW LABS.    Review of Systems  Reason unable to perform ROS: unable to participate.   Physical Exam Constitutional:      General: He is not in acute distress.    Appearance: He is well-developed. He is not diaphoretic.  Neck:     Thyroid : No thyromegaly.  Cardiovascular:     Rate and Rhythm: Normal rate and regular rhythm.     Heart sounds: Normal heart sounds.  Pulmonary:     Effort: Pulmonary effort is normal. No respiratory distress.     Breath sounds: Normal breath sounds.  Abdominal:     General: Bowel sounds are normal. There is no distension.     Palpations: Abdomen is soft.     Tenderness: There is no abdominal tenderness.  Musculoskeletal:     Cervical back: Neck supple.     Right lower leg: No edema.     Left lower leg: No edema.  Lymphadenopathy:     Cervical: No cervical adenopathy.  Skin:    General: Skin is warm and dry.  Neurological:     Comments: Is aware of surroundings.   Psychiatric:        Mood and Affect: Mood normal.      ASSESSMENT/ PLAN:  TODAY  Failure to thrive in adult Vascular dementia with behavioral disturbance.  After speaking with his son; will continue hospice care. Will focus his care upon comfort only; and will monitor his status.    Barnie Seip NP [facility]  call [phone]

## 2024-07-28 DEATH — deceased
